# Patient Record
Sex: Female | Born: 1940
Health system: Southern US, Community
[De-identification: ages and names within clinical notes are randomized; demographics above are authoritative.]

## PROBLEM LIST (undated history)

## (undated) ENCOUNTER — Emergency Department (HOSPITAL_COMMUNITY): Payer: PPO

## (undated) DIAGNOSIS — K5790 Diverticulosis of intestine, part unspecified, without perforation or abscess without bleeding: Secondary | ICD-10-CM

## (undated) DIAGNOSIS — M1990 Unspecified osteoarthritis, unspecified site: Secondary | ICD-10-CM

## (undated) DIAGNOSIS — R7303 Prediabetes: Secondary | ICD-10-CM

## (undated) DIAGNOSIS — K449 Diaphragmatic hernia without obstruction or gangrene: Secondary | ICD-10-CM

## (undated) DIAGNOSIS — G4733 Obstructive sleep apnea (adult) (pediatric): Secondary | ICD-10-CM

## (undated) DIAGNOSIS — N6019 Diffuse cystic mastopathy of unspecified breast: Secondary | ICD-10-CM

## (undated) DIAGNOSIS — E039 Hypothyroidism, unspecified: Secondary | ICD-10-CM

## (undated) DIAGNOSIS — Z8249 Family history of ischemic heart disease and other diseases of the circulatory system: Secondary | ICD-10-CM

## (undated) DIAGNOSIS — I10 Essential (primary) hypertension: Secondary | ICD-10-CM

## (undated) DIAGNOSIS — C8310 Mantle cell lymphoma, unspecified site: Secondary | ICD-10-CM

## (undated) DIAGNOSIS — E785 Hyperlipidemia, unspecified: Secondary | ICD-10-CM

## (undated) DIAGNOSIS — K648 Other hemorrhoids: Secondary | ICD-10-CM

## (undated) DIAGNOSIS — C439 Malignant melanoma of skin, unspecified: Secondary | ICD-10-CM

## (undated) DIAGNOSIS — E559 Vitamin D deficiency, unspecified: Secondary | ICD-10-CM

## (undated) DIAGNOSIS — D126 Benign neoplasm of colon, unspecified: Secondary | ICD-10-CM

## (undated) DIAGNOSIS — H269 Unspecified cataract: Secondary | ICD-10-CM

## (undated) DIAGNOSIS — K589 Irritable bowel syndrome without diarrhea: Secondary | ICD-10-CM

## (undated) DIAGNOSIS — K219 Gastro-esophageal reflux disease without esophagitis: Secondary | ICD-10-CM

## (undated) HISTORY — DX: Irritable bowel syndrome, unspecified: K58.9

## (undated) HISTORY — DX: Diverticulosis of intestine, part unspecified, without perforation or abscess without bleeding: K57.90

## (undated) HISTORY — DX: Malignant melanoma of skin, unspecified: C43.9

## (undated) HISTORY — PX: APPENDECTOMY: SHX54

## (undated) HISTORY — DX: Unspecified cataract: H26.9

## (undated) HISTORY — PX: BUNIONECTOMY: SHX129

## (undated) HISTORY — DX: Unspecified osteoarthritis, unspecified site: M19.90

## (undated) HISTORY — DX: Prediabetes: R73.03

## (undated) HISTORY — DX: Gastro-esophageal reflux disease without esophagitis: K21.9

## (undated) HISTORY — DX: Vitamin D deficiency, unspecified: E55.9

## (undated) HISTORY — DX: Other hemorrhoids: K64.8

## (undated) HISTORY — PX: KNEE ARTHROSCOPY: SUR90

## (undated) HISTORY — DX: Essential (primary) hypertension: I10

## (undated) HISTORY — PX: TONSILLECTOMY: SUR1361

## (undated) HISTORY — DX: Hypothyroidism, unspecified: E03.9

## (undated) HISTORY — DX: Diaphragmatic hernia without obstruction or gangrene: K44.9

## (undated) HISTORY — DX: Obstructive sleep apnea (adult) (pediatric): G47.33

## (undated) HISTORY — DX: Hyperlipidemia, unspecified: E78.5

## (undated) HISTORY — PX: UMBILICAL HERNIA REPAIR: SHX196

## (undated) HISTORY — DX: Diffuse cystic mastopathy of unspecified breast: N60.19

## (undated) HISTORY — PX: CATARACT EXTRACTION, BILATERAL: SHX1313

## (undated) HISTORY — DX: Benign neoplasm of colon, unspecified: D12.6

## (undated) HISTORY — DX: Mantle cell lymphoma, unspecified site: C83.10

---

## 1992-09-13 DIAGNOSIS — D126 Benign neoplasm of colon, unspecified: Secondary | ICD-10-CM

## 1992-09-13 HISTORY — DX: Benign neoplasm of colon, unspecified: D12.6

## 1998-08-21 ENCOUNTER — Other Ambulatory Visit: Admission: RE | Admit: 1998-08-21 | Discharge: 1998-08-21 | Payer: Self-pay | Admitting: Podiatry

## 1998-09-23 ENCOUNTER — Encounter: Payer: Self-pay | Admitting: Obstetrics and Gynecology

## 1998-09-23 ENCOUNTER — Ambulatory Visit (HOSPITAL_COMMUNITY): Admission: RE | Admit: 1998-09-23 | Discharge: 1998-09-23 | Payer: Self-pay | Admitting: Obstetrics and Gynecology

## 1999-04-29 ENCOUNTER — Encounter: Payer: Self-pay | Admitting: Internal Medicine

## 1999-04-29 ENCOUNTER — Ambulatory Visit (HOSPITAL_COMMUNITY): Admission: RE | Admit: 1999-04-29 | Discharge: 1999-04-29 | Payer: Self-pay | Admitting: Internal Medicine

## 1999-06-09 ENCOUNTER — Other Ambulatory Visit: Admission: RE | Admit: 1999-06-09 | Discharge: 1999-06-09 | Payer: Self-pay | Admitting: Obstetrics and Gynecology

## 1999-12-01 ENCOUNTER — Encounter: Payer: Self-pay | Admitting: Obstetrics and Gynecology

## 1999-12-01 ENCOUNTER — Ambulatory Visit (HOSPITAL_COMMUNITY): Admission: RE | Admit: 1999-12-01 | Discharge: 1999-12-01 | Payer: Self-pay | Admitting: Obstetrics and Gynecology

## 2000-06-16 ENCOUNTER — Other Ambulatory Visit: Admission: RE | Admit: 2000-06-16 | Discharge: 2000-06-16 | Payer: Self-pay | Admitting: Obstetrics and Gynecology

## 2001-01-03 ENCOUNTER — Ambulatory Visit (HOSPITAL_COMMUNITY): Admission: RE | Admit: 2001-01-03 | Discharge: 2001-01-03 | Payer: Self-pay | Admitting: Obstetrics and Gynecology

## 2001-01-03 ENCOUNTER — Encounter: Payer: Self-pay | Admitting: Obstetrics and Gynecology

## 2002-02-20 ENCOUNTER — Ambulatory Visit (HOSPITAL_COMMUNITY): Admission: RE | Admit: 2002-02-20 | Discharge: 2002-02-20 | Payer: Self-pay | Admitting: Internal Medicine

## 2002-02-20 ENCOUNTER — Encounter: Payer: Self-pay | Admitting: Internal Medicine

## 2002-10-16 ENCOUNTER — Other Ambulatory Visit: Admission: RE | Admit: 2002-10-16 | Discharge: 2002-10-16 | Payer: Self-pay | Admitting: Obstetrics and Gynecology

## 2003-03-05 ENCOUNTER — Ambulatory Visit (HOSPITAL_COMMUNITY): Admission: RE | Admit: 2003-03-05 | Discharge: 2003-03-05 | Payer: Self-pay | Admitting: Internal Medicine

## 2003-03-05 ENCOUNTER — Encounter: Payer: Self-pay | Admitting: Internal Medicine

## 2003-03-19 ENCOUNTER — Ambulatory Visit (HOSPITAL_COMMUNITY): Admission: RE | Admit: 2003-03-19 | Discharge: 2003-03-19 | Payer: Self-pay | Admitting: Internal Medicine

## 2003-12-03 ENCOUNTER — Ambulatory Visit: Admission: RE | Admit: 2003-12-03 | Discharge: 2003-12-03 | Payer: Self-pay | Admitting: Internal Medicine

## 2003-12-06 ENCOUNTER — Emergency Department (HOSPITAL_COMMUNITY): Admission: EM | Admit: 2003-12-06 | Discharge: 2003-12-07 | Payer: Self-pay | Admitting: Emergency Medicine

## 2003-12-09 ENCOUNTER — Other Ambulatory Visit: Admission: RE | Admit: 2003-12-09 | Discharge: 2003-12-09 | Payer: Self-pay | Admitting: Obstetrics and Gynecology

## 2004-03-17 ENCOUNTER — Ambulatory Visit (HOSPITAL_COMMUNITY): Admission: RE | Admit: 2004-03-17 | Discharge: 2004-03-17 | Payer: Self-pay | Admitting: Internal Medicine

## 2004-03-24 ENCOUNTER — Ambulatory Visit (HOSPITAL_COMMUNITY): Admission: RE | Admit: 2004-03-24 | Discharge: 2004-03-24 | Payer: Self-pay | Admitting: Internal Medicine

## 2004-12-31 ENCOUNTER — Other Ambulatory Visit: Admission: RE | Admit: 2004-12-31 | Discharge: 2004-12-31 | Payer: Self-pay | Admitting: Obstetrics and Gynecology

## 2005-04-08 ENCOUNTER — Ambulatory Visit (HOSPITAL_COMMUNITY): Admission: RE | Admit: 2005-04-08 | Discharge: 2005-04-08 | Payer: Self-pay | Admitting: Pulmonary Disease

## 2005-04-15 ENCOUNTER — Encounter: Admission: RE | Admit: 2005-04-15 | Discharge: 2005-04-15 | Payer: Self-pay | Admitting: Internal Medicine

## 2005-08-10 ENCOUNTER — Ambulatory Visit (HOSPITAL_COMMUNITY): Admission: RE | Admit: 2005-08-10 | Discharge: 2005-08-10 | Payer: Self-pay | Admitting: Obstetrics and Gynecology

## 2005-08-11 ENCOUNTER — Ambulatory Visit (HOSPITAL_COMMUNITY): Admission: RE | Admit: 2005-08-11 | Discharge: 2005-08-11 | Payer: Self-pay | Admitting: Internal Medicine

## 2005-08-23 ENCOUNTER — Ambulatory Visit: Payer: Self-pay | Admitting: Gastroenterology

## 2006-01-04 ENCOUNTER — Other Ambulatory Visit: Admission: RE | Admit: 2006-01-04 | Discharge: 2006-01-04 | Payer: Self-pay | Admitting: Obstetrics and Gynecology

## 2006-04-22 ENCOUNTER — Ambulatory Visit (HOSPITAL_COMMUNITY): Admission: RE | Admit: 2006-04-22 | Discharge: 2006-04-22 | Payer: Self-pay | Admitting: Obstetrics and Gynecology

## 2006-08-17 ENCOUNTER — Ambulatory Visit: Payer: Self-pay | Admitting: Gastroenterology

## 2006-08-31 ENCOUNTER — Ambulatory Visit: Payer: Self-pay | Admitting: Gastroenterology

## 2007-01-17 ENCOUNTER — Other Ambulatory Visit: Admission: RE | Admit: 2007-01-17 | Discharge: 2007-01-17 | Payer: Self-pay | Admitting: Obstetrics and Gynecology

## 2007-01-24 ENCOUNTER — Ambulatory Visit: Payer: Self-pay | Admitting: Cardiology

## 2007-02-14 ENCOUNTER — Ambulatory Visit: Payer: Self-pay

## 2007-02-14 ENCOUNTER — Encounter: Payer: Self-pay | Admitting: Cardiology

## 2007-05-23 ENCOUNTER — Ambulatory Visit (HOSPITAL_COMMUNITY): Admission: RE | Admit: 2007-05-23 | Discharge: 2007-05-23 | Payer: Self-pay | Admitting: Internal Medicine

## 2007-10-13 ENCOUNTER — Encounter: Admission: RE | Admit: 2007-10-13 | Discharge: 2007-11-04 | Payer: Self-pay | Admitting: Internal Medicine

## 2007-10-30 ENCOUNTER — Ambulatory Visit: Payer: Self-pay | Admitting: Cardiology

## 2008-02-09 ENCOUNTER — Other Ambulatory Visit: Admission: RE | Admit: 2008-02-09 | Discharge: 2008-02-09 | Payer: Self-pay | Admitting: Obstetrics and Gynecology

## 2008-05-30 ENCOUNTER — Ambulatory Visit (HOSPITAL_COMMUNITY): Admission: RE | Admit: 2008-05-30 | Discharge: 2008-05-30 | Payer: Self-pay | Admitting: Obstetrics and Gynecology

## 2009-02-12 ENCOUNTER — Other Ambulatory Visit: Admission: RE | Admit: 2009-02-12 | Discharge: 2009-02-12 | Payer: Self-pay | Admitting: Obstetrics and Gynecology

## 2009-02-19 ENCOUNTER — Ambulatory Visit (HOSPITAL_COMMUNITY): Admission: RE | Admit: 2009-02-19 | Discharge: 2009-02-19 | Payer: Self-pay | Admitting: Internal Medicine

## 2009-06-23 ENCOUNTER — Ambulatory Visit (HOSPITAL_COMMUNITY): Admission: RE | Admit: 2009-06-23 | Discharge: 2009-06-23 | Payer: Self-pay | Admitting: Internal Medicine

## 2009-06-27 ENCOUNTER — Encounter: Admission: RE | Admit: 2009-06-27 | Discharge: 2009-06-27 | Payer: Self-pay | Admitting: Internal Medicine

## 2009-09-01 ENCOUNTER — Ambulatory Visit (HOSPITAL_COMMUNITY): Admission: RE | Admit: 2009-09-01 | Discharge: 2009-09-01 | Payer: Self-pay | Admitting: Internal Medicine

## 2009-11-25 LAB — PULMONARY FUNCTION TEST

## 2009-12-02 ENCOUNTER — Encounter: Admission: RE | Admit: 2009-12-02 | Discharge: 2009-12-02 | Payer: Self-pay | Admitting: Internal Medicine

## 2009-12-03 ENCOUNTER — Ambulatory Visit (HOSPITAL_COMMUNITY): Admission: RE | Admit: 2009-12-03 | Discharge: 2009-12-03 | Payer: Self-pay | Admitting: Internal Medicine

## 2010-07-03 ENCOUNTER — Ambulatory Visit (HOSPITAL_COMMUNITY): Admission: RE | Admit: 2010-07-03 | Discharge: 2010-07-03 | Payer: Self-pay | Admitting: Internal Medicine

## 2010-10-04 ENCOUNTER — Encounter: Payer: Self-pay | Admitting: Internal Medicine

## 2010-12-24 ENCOUNTER — Ambulatory Visit (HOSPITAL_BASED_OUTPATIENT_CLINIC_OR_DEPARTMENT_OTHER): Payer: Medicare Other | Attending: Internal Medicine

## 2010-12-24 DIAGNOSIS — G4730 Sleep apnea, unspecified: Secondary | ICD-10-CM | POA: Insufficient documentation

## 2010-12-24 DIAGNOSIS — G4710 Hypersomnia, unspecified: Secondary | ICD-10-CM | POA: Insufficient documentation

## 2011-01-02 DIAGNOSIS — G4710 Hypersomnia, unspecified: Secondary | ICD-10-CM

## 2011-01-02 DIAGNOSIS — G4730 Sleep apnea, unspecified: Secondary | ICD-10-CM

## 2011-01-09 NOTE — Procedures (Signed)
NAMETINESHA, Shannon Parsons              ACCOUNT NO.:  [id number]  MEDICAL RECORD NO.:  [id number]          PATIENT TYPE:  OUT  LOCATION:  SLEEP CENTER                 FACILITY:  [facility]  PHYSICIAN:  Maciah Schweigert D. Maple Hudson, MD, FCCP, FACPDATE OF BIRTH:  18-Jan[birth year]1943  DATE OF STUDY:  12/24/2010                           NOCTURNAL POLYSOMNOGRAM  REFERRING PHYSICIAN:  AMY STEVENSON  INDICATION FOR STUDY:  Hypersomnia with sleep apnea.  EPWORTH SLEEPINESS SCORE:  2/24, BMI 28.9.  Weight 148 pounds, height 60 inches.  Neck 14 inches.  MEDICATIONS:  Home medications are charted and reviewed.  SLEEP ARCHITECTURE:  Total sleep time 216.5 minutes with sleep efficiency 68.9%.  Stage I was 15.5%, stage II 62.8%, stage III absent, REM 21.7% of total sleep time.  Sleep latency 72 minutes, REM latency 119 minutes, awake after sleep onset 47.5 minutes, arousal index 36.6.  BEDTIME MEDICATION:  None.  RESPIRATORY DATA:  Apnea-hypopnea index (AHI) 16.4 per hour.  A total of 71 events was scored including 39 obstructive apneas and 32 hypopneas. The events were associated with supine sleep position.  REM/AHI 8.5 per hour, RDI 32.5 per hour.  The patient did not have enough early sleep time within the first hours of the study to permit application of CPAP titration by split protocol on the study night.  Respiratory events were rather strongly clustered in the hour between midnight and 1 a.m.  OXYGEN DATA:  Moderately loud snoring with oxygen desaturation to a nadir of 84% and a mean oxygen saturation through the study of 92.1% on room air.  CARDIAC DATA:  Sinus rhythm with frequent PVCs including some trigeminy.  MOVEMENT-PARASOMNIA:  No significant movement disturbance.  No bathroom trips.  IMPRESSIONS-RECOMMENDATIONS: 1. Moderate obstructive sleep apnea/hypopnea syndrome, AHI 16.4 per     hour.  Clustered events.  Moderately loud snoring with oxygen     desaturation to a nadir of 84% and a mean oxygen  saturation through     the study of 92.1% on room air. 2. She did not meet the criteria required for initiation of the split     protocol CPAP titration on this study night because of timing     issues. Note, the clustering pattern on the sleep hypnogram graphic presentation.  If CPAP titration is considered a practical treatment direction then consider return for a dedicated CPAP titration study.  Otherwise, evaluate for alternative management as clinically appropriate.     Cheridan Kibler D. Maple Hudson, MD, [facility], FACP Diplomate, Biomedical engineer of Sleep Medicine Electronically Signed    CDY/MEDQ  D:  01/09/2011 08:32:43  T:  01/09/2011 16:10:96  Job:  [id number]

## 2011-01-26 NOTE — Assessment & Plan Note (Signed)
[facility]                            CARDIOLOGY OFFICE NOTE   NAME:Shannon Parsons, Shannon Parsons                     MRN:          [id number]  DATE:01/24/2007                            DOB:          [birth year]    REASON FOR CONSULTATION:  Chest pain.   HISTORY OF PRESENT ILLNESS:  Shannon Parsons is a 70 year old woman with a  history of possible irritable bowel syndrome, hyperlipidemia,  gastroesophageal reflux disease, and family history of cardiovascular  disease.  She has no personal history of coronary artery disease or  myocardial infarction, and reports a stress test back in July 2004 that  was interpreted as normal with no evidence of ischemia at adequate  workload.  Symptomatically, she has experienced a general feeling of  fatigue with activity, as well as sporadic episodes of chest discomfort  described as a heaviness, although not exertional in character.  This  lasts only for minutes at a time.  She did have one more significant  episode of sharp chest pain that woke her from sleep at night, and  lasted for a few minutes.  She has had some trouble sleeping, but denies  any problems with stress or other active medical problems.  Her symptoms  have been present for the last 6 weeks, approximately.  She has had no  interval ischemic testing.  She saw Dr. Elisabeth Most recently, and had an  electrocardiogram, which shows decreased R wave progression, but no  frank anterolateral Q waves, and otherwise nonspecific changes.  Cardiac  markers were also obtained with a normal total CK of 156, and a troponin  I of 0.05.  She is referred today to discuss additional testing.   ALLERGIES:  TETRACYCLINE.   PRESENT MEDICATIONS:  1. Lipitor 10 mg p.o. daily.  2. Multivitamin daily.  3. Vitamin D supplements.   PAST MEDICAL HISTORY:  As outlined above.   SOCIAL HISTORY:  Patient is married.  She has no children.  She denies  any tobacco or alcohol use.  She works  at a [employer]  work.   FAMILY HISTORY:  Significant for cardiovascular disease including  myocardial infarction in the patient's father in his 80s, as well as the  patient's mother in her 35s.  Also, has a brother who had heart disease  at a young age.   REVIEW OF SYSTEMS:  As described in the HPI.  She reports problems with  asthma, and also stomach problems.   EXAMINATION:  Blood pressure is 114/78.  Heart rate is 71.  Weight is  150 pounds.  Patient is comfortable, in no acute distress, appearing younger than  stated age.  HEENT:  Conjunctivae were normal.  Oropharynx was clear.  NECK:  Supple.  No elevated jugular venous pressure without bruits.  No  thyromegaly is noted.  LUNGS:  Clear without labored breathing at rest.  CARDIAC EXAM:  Reveals a regular rate and rhythm.  No loud murmur or S3  gallop.  No pericardial rub.  ABDOMEN:  Soft and non-tender with normoactive bowel sounds.  EXTREMITIES:  Show no pitting edema.  Distal  pulses are 2+.  SKIN:  Warm and dry.  MUSCULOSKELETAL:  No kyphosis is noted.  NEUROPSYCHIATRIC:  Patient is alert x3.  Affect is normal.   IMPRESSION AND RECOMMENDATIONS:  1. Atypical chest pain as outlined above.  Also, general feeling of      fatigue and dyspnea on exertion, although she does remain active      walking 5 days a week.  Electrocardiogram is nonspecific at rest.      Last ischemic evaluation was via an exercise treadmill test in      2004, which was normal.  I discussed options for further testing,      including both noninvasive and invasive techniques, and at this      point she is most comfortable with noninvasive evaluation.  We will      proceed with an exercise echocardiogram, and can proceed from      there.  2. Further plans to follow.     Jonelle Sidle, MD  Electronically Signed    SGM/MedQ  DD: 01/24/2007  DT: 01/24/2007  Job #: [id number]   cc:   Lovenia Kim, D.O.

## 2011-01-26 NOTE — Assessment & Plan Note (Signed)
HEALTHCARE                            CARDIOLOGY OFFICE NOTE   NAME:Torbert, ARBOR LEER                     MRN:          [id number]  DATE:10/30/2007                            DOB:          [birth year]    PRIMARY CARE PHYSICIAN:  Lovenia Kim, D.O.   REASON FOR VISIT:  Possible orthostatic hypotension.   HISTORY OF PRESENT ILLNESS:  I actually saw Ms. Mcmasters back in May  2008 in referral with atypical chest pain.  At that time, she underwent  an exercise echocardiogram which was reassuring demonstrating no  evidence of dysrhythmia or ischemia.  A baseline study showed normal  left ventricular systolic function with no major valvular abnormalities  and lipomatous hypertrophy of the inner atrial septum which is a normal  variant.  She is referred back to the office today given concerns about  three episodes of what sounds to be orthostatic hypotension.  The  patient states that typically in the morning after she has gotten out of  bed, usually within 5-10 minutes, and always when standing, she has had  three episodes where she felt dizzy and lightheaded as if she might pass  out if she did not sit down, although without any frank sense of  palpitations or chest pain.  Two episodes occurred in the shower and one  occurred when she was standing in the kitchen making coffee.  She sat  down in a chair and states that her husband took her blood pressure with  an automated cuff, getting readings of 70/40 and 85/49.  After  several minutes, her blood pressure was 107/70 which is more typical  for her baseline blood pressure.  She has no longstanding hypertension  history, and in fact, has a relatively low normal blood pressure.  She  drinks perhaps three bottles of water a day.  She has not been urinating  any more frequently than normal and has had no obvious fevers, cough or  hemoptysis.  She has had some left leg pain and states she had lower  extremity Dopplers done over the last 3 weeks which demonstrated no deep  venous thrombosis.  I do not have this report for review.  She is on no  diuretic medications, otherwise, no antihypertensives.   We did orthostatic checks in the office and these were overall normal.  Her heart rate went from 56-64 standing and 67 after standing for 5  minutes.  Systolic blood pressure went from 124-128 standing and to 122  after standing for 5 minutes.  The patient had no symptoms at this time.   ALLERGIES:  TETRACYCLINE.   MEDICATIONS:  1. Vitamin D.  2. Multivitamin.  3. Gas-X p.r.n.  4. Lipitor 10 mg p.o. daily.   REVIEW OF SYSTEMS:  As described in the history of present illness,  otherwise, negative.   PHYSICAL EXAMINATION:  VITAL SIGNS:  Blood pressure is 130/80, heart  rate is 64, weight 156 pounds.  GENERAL:  The patient is comfortable, in no acute distress.  HEENT:  Conjunctivae was normal.  Pharynx clear.  NECK:  Supple.  No  elevated pressure, no bruits or thyromegaly is noted.  LUNGS:  Clear with unlabored breathing.  CARDIAC:  Regular rate and rhythm.  No S3, gallop or pericardial rub.  ABDOMEN:  Soft, nontender.  Normoactive bowel sounds.  EXTREMITIES:  No pitting edema.  Distal pulses are full.  Dorsalis pedis  are 2+ bilaterally.  No cords on palpation of the calves.  SKIN:  Warm and dry.  MUSCULOSKELETAL:  No kyphosis is noted.  NEUROPSYCHIATRIC:  The patient alert and oriented x3.  Affect seems  appropriate.   IMPRESSION AND RECOMMENDATIONS:  Apparent intermittent orthostatic  hypotension without frank syncope or palpitations.  The patient is not  frankly orthostatic today but does have a low normal blood pressure at  baseline on review of the chart.  I talked about some basic modification  in her diet, liberalizing salt intake and drinking more fluids.  This  may correct the problem.  If she continues to manifest these spells, I  have asked her to document clearly  her blood pressure and heart rate,  and if she is in fact continuing to have trouble, a volume expander such  as Florinef 0.1 mg to 0.2 mg daily would be a reasonable choice.  I  doubt that this is related to a clear cardiac problem.  She has never  had a documented dysrhythmia, and her ejection fraction has been normal  on previous assessment.  I have asked her to keep an eye on her symptoms  and follow-up with Dr. Elisabeth Most.  Cardiology follow-up can be p.r.n.Jonelle Sidle, MD  Electronically Signed    SGM/MedQ  DD: 10/30/2007  DT: 10/31/2007  Job #: [id number]

## 2011-02-15 ENCOUNTER — Other Ambulatory Visit (HOSPITAL_COMMUNITY): Payer: Self-pay | Admitting: Internal Medicine

## 2011-02-15 ENCOUNTER — Ambulatory Visit (HOSPITAL_COMMUNITY)
Admission: RE | Admit: 2011-02-15 | Discharge: 2011-02-15 | Disposition: A | Discharge status: Home / Self Care | Payer: Medicare Other | Source: Ambulatory Visit | Attending: Internal Medicine | Admitting: Internal Medicine

## 2011-02-15 DIAGNOSIS — R05 Cough: Secondary | ICD-10-CM

## 2011-02-15 DIAGNOSIS — R0602 Shortness of breath: Secondary | ICD-10-CM | POA: Insufficient documentation

## 2011-02-15 DIAGNOSIS — R059 Cough, unspecified: Secondary | ICD-10-CM | POA: Insufficient documentation

## 2011-02-17 ENCOUNTER — Ambulatory Visit (HOSPITAL_BASED_OUTPATIENT_CLINIC_OR_DEPARTMENT_OTHER): Payer: Medicare Other | Attending: Internal Medicine

## 2011-02-17 DIAGNOSIS — I4949 Other premature depolarization: Secondary | ICD-10-CM | POA: Insufficient documentation

## 2011-02-17 DIAGNOSIS — G4710 Hypersomnia, unspecified: Secondary | ICD-10-CM | POA: Insufficient documentation

## 2011-02-17 DIAGNOSIS — G4730 Sleep apnea, unspecified: Secondary | ICD-10-CM | POA: Insufficient documentation

## 2011-02-20 DIAGNOSIS — I4949 Other premature depolarization: Secondary | ICD-10-CM

## 2011-02-20 DIAGNOSIS — G4710 Hypersomnia, unspecified: Secondary | ICD-10-CM

## 2011-02-20 DIAGNOSIS — G4730 Sleep apnea, unspecified: Secondary | ICD-10-CM

## 2011-02-20 NOTE — Procedures (Signed)
Shannon Parsons, Shannon Parsons              ACCOUNT NO.:  [id number]  MEDICAL RECORD NO.:  [id number]          PATIENT TYPE:  OUT  LOCATION:  SLEEP CENTER                 FACILITY:  [facility]  PHYSICIAN:  Clinton D. Maple Hudson, MD, FCCP, FACPDATE OF BIRTH:  1941/07/30  DATE OF STUDY:  02/17/2011                           NOCTURNAL POLYSOMNOGRAM  REFERRING PHYSICIAN:  AMY STEVENSON  INDICATION FOR STUDY:  Hypersomnia with sleep apnea.  EPWORTH SLEEPINESS SCORE:  2/24, BMI 28.9, weight 148 pounds, height 60 inches.  Neck 14 inches.  MEDICATIONS:  Home medications are charted and reviewed.  Baseline diagnostic NPSG on December 24, 2010, had recorded an AHI of 16.4 per hour.  CPAP titration is requested.  SLEEP ARCHITECTURE:  Total sleep time 259.5 minutes with sleep efficiency 69.7%.  Stage I was 12.9%, stage II 75.3%, stage III absent, REM 11.8% of total sleep time.  Sleep latency 25 minutes.  REM latency 76.5 minutes.  Awake after sleep onset 87.5 minutes.  Arousal index 21.3.  Bedtime medication:  None.  RESPIRATORY DATA:  CPAP titration protocol.  CPAP was titrated to 6 CWP, AHI 0 per hour.  She wore a standard ResMed Mirage FX mask with heated humidifier and C-Flex of 3.  OXYGEN DATA:  Snoring was prevented by CPAP and oxygen maintained at mean saturation 93.1% on room air.  CARDIAC DATA:  Sinus rhythm with frequent PVCs.  MOVEMENT-PARASOMNIA:  No significant movement disturbance.  No bathroom trips.  IMPRESSIONS-RECOMMENDATIONS: 1. Successful CPAP titration to 6 CWP, AHI 0 per hour.  She wore a     standard ResMed Mirage FX mask with heated humidifier and a C-Flex     setting of 3. 2. Baseline diagnostic NPSG on December 24, 2010, had recorded an AHI of     16.4 per hour.     Clinton D. Maple Hudson, MD, [facility], FACP Diplomate, Biomedical engineer of Sleep Medicine Electronically Signed   CDY/MEDQ  D:  02/20/2011 10:05:31  T:  02/20/2011 23:34:12  Job:  [id number]

## 2011-03-15 ENCOUNTER — Encounter: Payer: Self-pay | Admitting: Pulmonary Disease

## 2011-03-16 ENCOUNTER — Ambulatory Visit (INDEPENDENT_AMBULATORY_CARE_PROVIDER_SITE_OTHER): Payer: Medicare Other | Admitting: Pulmonary Disease

## 2011-03-16 ENCOUNTER — Encounter: Payer: Self-pay | Admitting: Pulmonary Disease

## 2011-03-16 DIAGNOSIS — J449 Chronic obstructive pulmonary disease, unspecified: Secondary | ICD-10-CM | POA: Insufficient documentation

## 2011-03-16 DIAGNOSIS — J45909 Unspecified asthma, uncomplicated: Secondary | ICD-10-CM

## 2011-03-16 NOTE — Patient Instructions (Signed)
Will start back on dulera 100/5  2 inhalations am and pm everyday no matter how you feel.  Rinse mouth well. Will schedule for breathing studies in 2-3 weeks, and see you back same day to discuss.

## 2011-03-16 NOTE — Assessment & Plan Note (Addendum)
The pt's history is most suggestive of chronic obstructive asthma.  She has really not wanted to be on maintenance meds in the past, but I have discussed with her the pathophysiology of asthma and the role of ongoing airway inflammation.  I have stressed to her the importance of being on chronic ICS, and that without this, she is likely to have progressive airway remodeling with advancing airflow obstruction.  Will restart dulera at this point, and schedule her back for PFT's to get an idea where we stand.  She is agreeable to staying on meds.

## 2011-03-16 NOTE — Progress Notes (Signed)
  Subjective:    Patient ID: Franki Monte, female    DOB: 06-25-1941, 70 y.o.   MRN: [id number]  HPI The pt is a 70y/o female who I have been asked to see for breathing issues.  The pt states that she has had breathing problems "all of my life", and was diagnosed with asthma as an infant.  She states she took "adrenaline shots" for her symptoms.  She did better as an adult until her 10's, and began to take allergy shots which "helped".  The last few yrs her symptoms have worsened, and she has been tried on various medications.  She started on singulair about 3 mos ago, but stopped taking.  She was on dulera in May, and went back to duonebs.  She tells me she has had 3-4 flares of her breathing in the last year, with her last one being 2mos ago.  She was treated with abx and prednisone, and went back to her usual baseline.  Currently, she notes dry cough, but has excellent exercise tolerance.  She walks 30-52min everyday.  She has had a recent cxr which showed hyperinflation and prominent bronchovascular markings.  She has never had full pfts.    Review of Systems  Constitutional: Negative for fever and unexpected weight change.  HENT: Negative for ear pain, nosebleeds, congestion, sore throat, rhinorrhea, sneezing, trouble swallowing, dental problem, postnasal drip and sinus pressure.   Eyes: Negative for redness and itching.  Respiratory: Positive for cough and shortness of breath. Negative for chest tightness and wheezing.   Cardiovascular: Positive for chest pain. Negative for palpitations and leg swelling.  Gastrointestinal: Negative for nausea and vomiting.  Genitourinary: Negative for dysuria.  Musculoskeletal: Negative for joint swelling.  Skin: Positive for rash.  Neurological: Negative for headaches.  Hematological: Does not bruise/bleed easily.  Psychiatric/Behavioral: Negative for dysphoric mood. The patient is not nervous/anxious.        Objective:   Physical  Exam Constitutional:  Well developed, no acute distress  HENT:  Nares patent without discharge  Oropharynx without exudate, palate and uvula are normal  Eyes:  Perrla, eomi, no scleral icterus  Neck:  No JVD, no TMG  Cardiovascular:  Normal rate, regular rhythm, no rubs or gallops.  No murmurs        Intact distal pulses  Pulmonary :  decreased breath sounds, no stridor or respiratory distress   No rales, rhonchi, or wheezing  Abdominal:  Soft, nondistended, bowel sounds present.  No tenderness noted.   Musculoskeletal:  No lower extremity edema noted.  Lymph Nodes:  No cervical lymphadenopathy noted  Skin:  No cyanosis noted  Neurologic:  Alert, appropriate, moves all 4 extremities without obvious deficit.         Assessment & Plan:

## 2011-03-26 ENCOUNTER — Telehealth: Payer: Self-pay | Admitting: Pulmonary Disease

## 2011-03-26 NOTE — Telephone Encounter (Signed)
I spoke with the pt and she states when she saw [facility] she told him it had been 5 yrs since she had the last PFT but when she went through her records she actually had one last year. She wants to know does she still need another PFT. The pt has a copy of this PFT and states she will fax it for [facility] to review. I gave her triage fax number and advised to put it to attention Megan. I will forward message to Aundra Millet so she is aware. Carron Curie, CMA

## 2011-03-29 ENCOUNTER — Telehealth: Payer: Self-pay | Admitting: Pulmonary Disease

## 2011-03-29 NOTE — Telephone Encounter (Signed)
She needs to have pfts after being ON maintenance therapy for a few weeks.  Hence we started dulera, and she is to have pfts in 2-3 weeks.  This will establish her usual baseline ON good treatment.

## 2011-03-29 NOTE — Telephone Encounter (Signed)
This is a duplicate message. I advised the form was given to [facility] to review and we will call them with his recs. Carron Curie, CMA

## 2011-03-29 NOTE — Telephone Encounter (Signed)
Received results and put in KC's very important look at folder for him to review.  

## 2011-03-30 NOTE — Telephone Encounter (Signed)
Pt aware. Shannon Parsons, CMA  

## 2011-04-02 ENCOUNTER — Other Ambulatory Visit: Payer: Medicare Other

## 2011-04-02 ENCOUNTER — Ambulatory Visit (INDEPENDENT_AMBULATORY_CARE_PROVIDER_SITE_OTHER): Payer: Medicare Other | Admitting: Pulmonary Disease

## 2011-04-02 ENCOUNTER — Encounter: Payer: Self-pay | Admitting: Pulmonary Disease

## 2011-04-02 DIAGNOSIS — J45909 Unspecified asthma, uncomplicated: Secondary | ICD-10-CM

## 2011-04-02 LAB — PULMONARY FUNCTION TEST

## 2011-04-02 MED ORDER — BECLOMETHASONE DIPROPIONATE 80 MCG/ACT IN AERS: 2.0000 | INHALATION_SPRAY | Freq: Two times a day (BID) | RESPIRATORY_TRACT | Status: DC

## 2011-04-02 NOTE — Patient Instructions (Signed)
Stop  dulera Start qvar 2 inhalations am and pm.  Rinse mouth well.  Can still use rescue inhaler if needed.  Will check bloodwork looking for hereditary form of copd.  Will call you with results followup with me in 6mos.

## 2011-04-02 NOTE — Progress Notes (Signed)
  Subjective:    Patient ID: Shannon Parsons, female    DOB: 1940/12/03, 70 y.o.   MRN: [id number]  HPI The pt comes in today for f/u of her pfts.  She has a history of asthma, and was started on dulera last visit.  She has not seen a big difference in her breathing since being on the med, but has developed issues with hoarseness and dysphonia.  Her pfts today show moderate obstruction, no restriction, and a normal DLCO.  I have reviewed these in detail with her, and answered all of her questions.    Review of Systems  Constitutional: Negative for fever and unexpected weight change.  HENT: Positive for postnasal drip. Negative for ear pain, nosebleeds, congestion, sore throat, rhinorrhea, sneezing, trouble swallowing, dental problem and sinus pressure.   Eyes: Negative for redness and itching.  Respiratory: Negative for cough, chest tightness, shortness of breath and wheezing.   Cardiovascular: Negative for palpitations and leg swelling.  Gastrointestinal: Negative for nausea and vomiting.  Genitourinary: Negative for dysuria.  Musculoskeletal: Negative for joint swelling.  Skin: Negative for rash.  Neurological: Negative for headaches.  Hematological: Bruises/bleeds easily.  Psychiatric/Behavioral: Negative for dysphoric mood. The patient is not nervous/anxious.        Objective:   Physical Exam Wd female in nad Nares without discharge or purulence Chest clear, but mild decrease in bs Cor with rrr LE without edema, no cyanosis Alert, oriented, moves all 4        Assessment & Plan:

## 2011-04-02 NOTE — Progress Notes (Signed)
PFT was done today.  

## 2011-04-02 NOTE — Assessment & Plan Note (Signed)
The pt has moderate obstruction on pfts that I suspect is fixed, and due to longstanding asthma with chronic ongoing airway inflammation.  Will also check AAT level for completeness.  The pt did not see a difference in her breathing with dulera, but I have explained to her that we are trying to prevent progressive airflow obstruction over the yrs that could put her functional status in jeopardy.  I will change her inhaler to qvar, which I hope with its small particle size will decrease her risk of thrush/dysphonia.  She is satisfied with her exertional tolerance, so will stay away from LABA for now.

## 2011-04-16 ENCOUNTER — Telehealth: Payer: Self-pay | Admitting: Pulmonary Disease

## 2011-04-16 NOTE — Telephone Encounter (Signed)
Not back in computer yet.

## 2011-04-16 NOTE — Telephone Encounter (Signed)
Phone encounter signed by accident -- will hold message in triage until we reach pt.

## 2011-04-16 NOTE — Telephone Encounter (Signed)
[id number]

## 2011-04-16 NOTE — Telephone Encounter (Signed)
Called and spoke with pt and she is aware that [facility] wanted her to cont the qvar and follow back up in 6 months.  Pt stated that she will try to cont the med but it is expensive.  She also wanted to know the results of the alpha 1 test.  i explained that once we get the results back we will call and let her know.  KC please advise if you have seen the results.  thanks

## 2011-04-19 ENCOUNTER — Telehealth: Payer: Self-pay | Admitting: Pulmonary Disease

## 2011-04-19 NOTE — Telephone Encounter (Signed)
LMTCB

## 2011-04-19 NOTE — Telephone Encounter (Signed)
Called spoke with patient, informed her that her A1AT results are not back yet and can take a few weeks for the results to return.   Pt does request KC's recs on her qvar : pt states breathing is doing well on the 2 puffs bid but d/t the cost of this inhaler, if she can either reduce the puffs per day to 1 puff bid or change to another inhaler that may be less expensive.  KC please advise, thanks!

## 2011-04-19 NOTE — Telephone Encounter (Signed)
Let her know that qvar is probably the least expensive inhaled steroid on the market unless her insurance has a preferred drug.  Part of the problem is that qvar also has the smallest particle size which minimizes thrush and changes in voice. She can try one in am and pm, but if not controlled really needs to be on 2 am and pm.

## 2011-04-19 NOTE — Telephone Encounter (Signed)
Pt aware of kc's response and she really likes the qvar so she is going to try using it 1 puff twice a day if this does not work she will call back with a formulary that list covered inhaled steroids for dr clance to pick from but for now she is going to try the 1 puff twice a day

## 2011-05-27 ENCOUNTER — Telehealth: Payer: Self-pay | Admitting: Gastroenterology

## 2011-05-27 NOTE — Telephone Encounter (Signed)
All questions answered

## 2011-06-01 ENCOUNTER — Telehealth: Payer: Self-pay | Admitting: Pulmonary Disease

## 2011-06-01 NOTE — Telephone Encounter (Signed)
Shannon Parsons, can you please check on this with lab.  I don/t see a result back.

## 2011-06-01 NOTE — Telephone Encounter (Signed)
Pt states she had lab work for alpha 1 done on 04/02/11 and still has not heard back on these results. Please advise Dr. Shelle Iron if you have lab results. Thanks  Carver Fila, CMA

## 2011-06-02 NOTE — Telephone Encounter (Signed)
Received results from [facility] and put in [facility] very important look at folder.

## 2011-06-02 NOTE — Telephone Encounter (Signed)
I spoke with patient about results and he verbalized understanding and had no questions 

## 2011-06-02 NOTE — Telephone Encounter (Signed)
Let her know sorry for the delay.  The lab lost her results, and we just received.   Let her know is normal

## 2011-06-02 NOTE — Telephone Encounter (Signed)
Called [facility] at [phone] and spoke with Scott at ext [phone].  He is going to fax over results to the triage fax.

## 2011-06-08 ENCOUNTER — Encounter: Payer: Self-pay | Admitting: Pulmonary Disease

## 2011-06-23 ENCOUNTER — Encounter: Payer: Self-pay | Admitting: Gastroenterology

## 2011-06-28 ENCOUNTER — Other Ambulatory Visit (HOSPITAL_COMMUNITY): Payer: Self-pay | Admitting: Internal Medicine

## 2011-06-28 DIAGNOSIS — Z1231 Encounter for screening mammogram for malignant neoplasm of breast: Secondary | ICD-10-CM

## 2011-06-28 DIAGNOSIS — Z780 Asymptomatic menopausal state: Secondary | ICD-10-CM

## 2011-06-28 DIAGNOSIS — M8580 Other specified disorders of bone density and structure, unspecified site: Secondary | ICD-10-CM

## 2011-07-19 ENCOUNTER — Encounter: Payer: Self-pay | Admitting: Gastroenterology

## 2011-07-19 ENCOUNTER — Ambulatory Visit (INDEPENDENT_AMBULATORY_CARE_PROVIDER_SITE_OTHER): Payer: Medicare Other | Admitting: Gastroenterology

## 2011-07-19 DIAGNOSIS — R159 Full incontinence of feces: Secondary | ICD-10-CM

## 2011-07-19 DIAGNOSIS — K5900 Constipation, unspecified: Secondary | ICD-10-CM

## 2011-07-19 DIAGNOSIS — K5730 Diverticulosis of large intestine without perforation or abscess without bleeding: Secondary | ICD-10-CM

## 2011-07-19 DIAGNOSIS — Z860100 Personal history of colon polyps, unspecified: Secondary | ICD-10-CM | POA: Insufficient documentation

## 2011-07-19 MED ORDER — PEG-KCL-NACL-NASULF-NA ASC-C 100 G PO SOLR: 1.0000 | Freq: Once | ORAL | Status: DC

## 2011-07-19 NOTE — Patient Instructions (Addendum)
You have been scheduled for a Colonoscopy. See separate instructions.  Pick up your prep kit from your pharmacy.  Please start Miralax 17 grams in 8 oz of water 1-2 x daily for constipation. cc: Lucky Cowboy, MD

## 2011-07-19 NOTE — Progress Notes (Signed)
History of Present Illness: This is a 70 year old female with chronic complaints of bloating, lower abdominal discomfort and constipation. She states these symptoms have been worse over the past year. She states she has 2 bowel movements each day and spends up to 30 minutes on the commode prior to a bowel movement. She has episodes of small amounts of loose stool incontinence for the past year when she is on her feet for long periods of time. She was referred to [facility] by her primary physician and it sounds like she had biofeedback or another treatment for fecal incontinence which was not helpful. Her last colonoscopy was performed in December 2007 and showed only diverticulosis. Denies weight loss, abdominal pain, constipation, diarrhea, change in stool caliber, melena, hematochezia, nausea, vomiting, dysphagia, reflux symptoms, chest pain.  Review of Systems: Pertinent positive and negative review of systems were noted in the above HPI section. All other review of systems were otherwise negative.  Current Medications, Allergies, Past Medical History, Past Surgical History, Family History and Social History were reviewed in [employer] record.  Physical Exam: General: Well developed , well nourished, no acute distress Head: Normocephalic and atraumatic Eyes:  sclerae anicteric, EOMI Ears: Normal auditory acuity Mouth: No deformity or lesions Neck: Supple, no masses or thyromegaly Lungs: Clear throughout to auscultation Heart: Regular rate and rhythm; no murmurs, rubs or bruits Abdomen: Soft, non tender and non distended. No masses, hepatosplenomegaly or hernias noted. Normal Bowel sounds Rectal: Deferred to colonoscopy at patient request. States her gynecologist performed a rectal exam in September that was unremarkable Musculoskeletal: Symmetrical with no gross deformities  Skin: No lesions on visible extremities Pulses:  Normal pulses noted Extremities: No  clubbing, cyanosis, edema or deformities noted Neurological: Alert oriented x 4, resting tremor. Cervical Nodes:  No significant cervical adenopathy Inguinal Nodes: No significant inguinal adenopathy Psychological:  Alert and cooperative. Normal mood and affect  Assessment and Recommendations:  1. Fecal incontinence. Rule out overflow with constipation. Suspect anorectal muscle dysfunction or pelvic floor relaxation. Trial of MiraLax once or twice daily for possible constipation. Further evaluation colonoscopy and consider referral to [facility] for further management.  2. Constipation and probable irritable bowel syndrome. MiraLax once or twice daily as above and continue until 3 times a day when necessary. Consider a trial of probiotics and Gas-X if her abdominal discomfort and bloating do not improve with treatment of her constipation.  3. Personal history of adenomatous colon polyps. She is due for 5 years surveillance colonoscopy. The risks, benefits, and alternatives to colonoscopy with possible biopsy and possible polypectomy were discussed with the patient and they consent to proceed.

## 2011-07-20 ENCOUNTER — Encounter: Payer: Self-pay | Admitting: Gastroenterology

## 2011-07-22 ENCOUNTER — Ambulatory Visit (HOSPITAL_COMMUNITY)
Admission: RE | Admit: 2011-07-22 | Discharge: 2011-07-22 | Disposition: A | Discharge status: Home / Self Care | Payer: Medicare Other | Source: Ambulatory Visit | Attending: Internal Medicine | Admitting: Internal Medicine

## 2011-07-22 DIAGNOSIS — M8580 Other specified disorders of bone density and structure, unspecified site: Secondary | ICD-10-CM

## 2011-07-22 DIAGNOSIS — Z1231 Encounter for screening mammogram for malignant neoplasm of breast: Secondary | ICD-10-CM

## 2011-07-22 DIAGNOSIS — Z13820 Encounter for screening for osteoporosis: Secondary | ICD-10-CM | POA: Insufficient documentation

## 2011-07-22 DIAGNOSIS — Z780 Asymptomatic menopausal state: Secondary | ICD-10-CM

## 2011-07-22 LAB — HM DEXA SCAN

## 2011-08-19 ENCOUNTER — Ambulatory Visit (AMBULATORY_SURGERY_CENTER): Payer: Medicare Other | Admitting: Gastroenterology

## 2011-08-19 ENCOUNTER — Encounter: Payer: Self-pay | Admitting: Gastroenterology

## 2011-08-19 DIAGNOSIS — D126 Benign neoplasm of colon, unspecified: Secondary | ICD-10-CM

## 2011-08-19 DIAGNOSIS — K5730 Diverticulosis of large intestine without perforation or abscess without bleeding: Secondary | ICD-10-CM

## 2011-08-19 DIAGNOSIS — Z860100 Personal history of colon polyps, unspecified: Secondary | ICD-10-CM

## 2011-08-19 DIAGNOSIS — R159 Full incontinence of feces: Secondary | ICD-10-CM

## 2011-08-19 DIAGNOSIS — Z1211 Encounter for screening for malignant neoplasm of colon: Secondary | ICD-10-CM

## 2011-08-19 DIAGNOSIS — K5900 Constipation, unspecified: Secondary | ICD-10-CM

## 2011-08-19 LAB — HM COLONOSCOPY

## 2011-08-19 MED ORDER — SODIUM CHLORIDE 0.9 % IV SOLN: 500.0000 mL | INTRAVENOUS | Status: DC

## 2011-08-19 NOTE — Op Note (Signed)
[name] [address] [place], [place]  [address]  COLONOSCOPY PROCEDURE REPORT  PATIENT:  Shannon Parsons, Shannon Parsons  MR#:  [id number] BIRTHDATE:  1941-05-15, 70 yrs. old  GENDER:  female ENDOSCOPIST:  Judie Petit T. Russella Dar, MD, [facility]  PROCEDURE DATE:  08/19/2011 PROCEDURE:  Colonoscopy with snare polypectomy ASA CLASS:  Class III INDICATIONS:  surveillance and high-risk screening, fecal incontinence, history of pre-cancerous (adenomatous) colon polyps: 1994. MEDICATIONS:   MAC sedation, administered by CRNA, propofol (Diprivan) 230 mg IV DESCRIPTION OF PROCEDURE:   After the risks benefits and alternatives of the procedure were thoroughly explained, informed consent was obtained.  Digital rectal exam was performed and revealed no abnormalities and nromal sphincter tone.  The LB CF-H180AL [id number] endoscope was introduced through the anus and advanced to the cecum, which was identified by both the appendix and ileocecal valve, without limitations.  The quality of the prep was good, using MoviPrep.  The instrument was then slowly withdrawn as the colon was fully examined. <<PROCEDUREIMAGES>> FINDINGS:  A sessile polyp was found in the mid transverse colon. It was 5 mm in size. Polyp was snared without cautery. Retrieval was successful.  Moderate diverticulosis was found in the sigmoid colon. Otherwise normal colonoscopy without other polyps, masses, vascular ectasias, or inflammatory changes. Retroflexed views in the rectum revealed no abnormalities.  The time to cecum =  2.33 minutes. The scope was then withdrawn (time =  8  min) from the patient and the procedure completed.  COMPLICATIONS:  None  ENDOSCOPIC IMPRESSION: 1) 5 mm sessile polyp in the mid transverse colon 2) Moderate diverticulosis in the sigmoid colon  RECOMMENDATIONS: 1) Await pathology results 2) High fiber diet with liberal fluid intake. 3) Repeat Colonoscopy in 5 years.  Shannon Lick. Russella Dar, MD, Shannon Parsons  CC:   Shannon Cowboy, MD  n. Shannon Parsons at 08/19/2011 08:53 AM  Shannon Parsons, [id number]

## 2011-08-19 NOTE — Progress Notes (Signed)
Patient did not experience any of the following events: a burn prior to discharge; a fall within the facility; wrong site/side/patient/procedure/implant event; or a hospital transfer or hospital admission upon discharge from the facility. (G8907) Patient did not have preoperative order for IV antibiotic SSI prophylaxis. (G8918)  

## 2011-08-20 ENCOUNTER — Telehealth: Payer: Self-pay | Admitting: *Deleted

## 2011-08-20 NOTE — Telephone Encounter (Signed)

## 2011-08-31 ENCOUNTER — Encounter: Payer: Self-pay | Admitting: Gastroenterology

## 2011-08-31 ENCOUNTER — Telehealth: Payer: Self-pay | Admitting: Gastroenterology

## 2011-08-31 NOTE — Telephone Encounter (Signed)
Recall entered and I have notified the patient

## 2011-08-31 NOTE — Telephone Encounter (Signed)
Patient calling for results of the polyp path.  Dr Russella Dar I don't see a letter generated in the system.  Please advise recall date.

## 2011-08-31 NOTE — Telephone Encounter (Signed)
Recall 08/2016 I generated a letter

## 2011-09-14 DIAGNOSIS — H269 Unspecified cataract: Secondary | ICD-10-CM

## 2011-09-14 HISTORY — DX: Unspecified cataract: H26.9

## 2011-10-04 ENCOUNTER — Ambulatory Visit (INDEPENDENT_AMBULATORY_CARE_PROVIDER_SITE_OTHER): Payer: Medicare Other | Admitting: Pulmonary Disease

## 2011-10-04 ENCOUNTER — Encounter: Payer: Self-pay | Admitting: Pulmonary Disease

## 2011-10-04 DIAGNOSIS — J45909 Unspecified asthma, uncomplicated: Secondary | ICD-10-CM

## 2011-10-04 NOTE — Patient Instructions (Signed)
Stay on qvar 2 inhalations each am everyday.  If you get sick and asthma worsens, would increase dose to twice a day until better, then can go back to once a day followup with me in 6mos.

## 2011-10-04 NOTE — Assessment & Plan Note (Signed)
The patient has done very well from an asthma standpoint on a reduced Qvar dose.  She is not requiring albuterol rescue or neck treatment.  I have asked her to change her current dose to 2 puffs each a.m. To ensure that she gets an adequate dose, and she can increase this to b.i.d. Dosing with chest colds until she is improved.

## 2011-10-04 NOTE — Progress Notes (Signed)
  Subjective:    Patient ID: Shannon Parsons, female    DOB: Sep 30, 1940, 71 y.o.   MRN: [id number]  HPI The patient comes in today for followup of her known asthma.  She is staying on Qvar alone, and only using it once a day.  She has had no breathing issues for acute exacerbations since the last visit.  She feels that she is breathing well, and has not required any rescue medication.   Review of Systems  Constitutional: Negative for fever and unexpected weight change.  HENT: Positive for postnasal drip. Negative for ear pain, nosebleeds, congestion, sore throat, rhinorrhea, sneezing, trouble swallowing, dental problem and sinus pressure.   Eyes: Negative for redness and itching.  Respiratory: Negative for cough, chest tightness, shortness of breath and wheezing.   Cardiovascular: Negative for palpitations and leg swelling.  Gastrointestinal: Negative for nausea and vomiting.  Genitourinary: Negative for dysuria.  Musculoskeletal: Negative for joint swelling.  Skin: Negative for rash.  Neurological: Negative for headaches.  Hematological: Does not bruise/bleed easily.  Psychiatric/Behavioral: Negative for dysphoric mood. The patient is not nervous/anxious.        Objective:   Physical Exam Well-developed female in no acute distress Nose without purulence or discharge noted Chest totally clear to auscultation Cardiac regular rate and rhythm Lower extremities with no edema, no cyanosis noted Alert and oriented, moves all 4 extremities.       Assessment & Plan:

## 2012-07-21 ENCOUNTER — Telehealth: Payer: Self-pay | Admitting: Pulmonary Disease

## 2012-07-21 NOTE — Telephone Encounter (Signed)
I spoke with pt and she is scheduled to see [facility] 08/04/12 at 2:45. She stated she wants to wait until she comes in and see KC. I asked her if she is sure. She stated yes. Nothing further was needed

## 2012-08-04 ENCOUNTER — Ambulatory Visit (INDEPENDENT_AMBULATORY_CARE_PROVIDER_SITE_OTHER): Payer: Medicare Other | Admitting: Pulmonary Disease

## 2012-08-04 ENCOUNTER — Encounter: Payer: Self-pay | Admitting: Pulmonary Disease

## 2012-08-04 VITALS — BP 120/72 | HR 61 | Temp 98.1°F | Ht 64.0 in | Wt 142.0 lb

## 2012-08-04 DIAGNOSIS — J45901 Unspecified asthma with (acute) exacerbation: Secondary | ICD-10-CM

## 2012-08-04 MED ORDER — BECLOMETHASONE DIPROPIONATE 80 MCG/ACT IN AERS: 1.0000 | INHALATION_SPRAY | RESPIRATORY_TRACT | Status: DC | PRN

## 2012-08-04 MED ORDER — PREDNISONE 10 MG PO TABS: ORAL_TABLET | ORAL | Status: DC

## 2012-08-04 MED ORDER — METHYLPREDNISOLONE ACETATE 80 MG/ML IJ SUSP
80.0000 mg | Freq: Once | INTRAMUSCULAR | Status: AC
  Administered 2012-08-04: 80 mg via INTRAMUSCULAR

## 2012-08-04 MED ORDER — ALBUTEROL SULFATE HFA 108 (90 BASE) MCG/ACT IN AERS: 2.0000 | INHALATION_SPRAY | Freq: Four times a day (QID) | RESPIRATORY_TRACT | Status: DC | PRN

## 2012-08-04 NOTE — Assessment & Plan Note (Signed)
The patient is clearly having an acute asthma exacerbation, probably brought on by noncompliance with inhaled corticosteroids.  I have stressed to her again the importance of her maintenance inhaled steroid, and she will need a Depo-Medrol injection as well as a prednisone pulse to get her through this.  She is to call us if she begins to have purulent mucus.

## 2012-08-04 NOTE — Progress Notes (Signed)
  Subjective:    Patient ID: Shannon Parsons, female    DOB: Dec 20, 1940, 71 y.o.   MRN: [id number]  HPI The patient comes in today for an acute sick visit.  She has known asthma, and unfortunately has not been compliant with her inhaled corticosteroids.  She gives a 5 day history of increasing shortness of breath, chest tightness with classic air trapping symptoms, as well as increased cough.  She has not had any purulence noted.  I had asked her to increase her Qvar to 2 puffs each morning at the last visit, and she has not done this.  She has actually missed doses over the last few weeks.   Review of Systems  Constitutional: Negative for fever and unexpected weight change.  HENT: Positive for congestion and sinus pressure. Negative for ear pain, nosebleeds, sore throat, rhinorrhea, sneezing, trouble swallowing, dental problem and postnasal drip.   Eyes: Negative for redness and itching.  Respiratory: Positive for cough, chest tightness, shortness of breath and wheezing.   Cardiovascular: Negative for palpitations and leg swelling.  Gastrointestinal: Negative for nausea and vomiting.  Genitourinary: Negative for dysuria.  Musculoskeletal: Negative for joint swelling.  Skin: Negative for rash.  Neurological: Negative for headaches.  Hematological: Bruises/bleeds easily.  Psychiatric/Behavioral: Negative for dysphoric mood. The patient is not nervous/anxious.        Objective:   Physical Exam Well-developed female with mild increased work of breathing Nose without purulence or discharge noted Oropharynx clear Neck without JVD or lymphadenopathy Chest with scattered wheezes, and decreased airflow Cardiac exam with regular rate and rhythm Lower extremities with no edema, no cyanosis Alert and oriented, moves all extremities.       Assessment & Plan:

## 2012-08-04 NOTE — Patient Instructions (Addendum)
Will give you a steroid shot today to start working right away. Will give you a prednisone taper to help fight airway inflammation Stay on qvar 80  2 inhalations am and pm for the next 2-3 weeks, then can decrease to 2 puffs each am if doing well.  Please call us if you begin to cough up discolored mucus. followup with me in 2 mos, but call if not improving.

## 2012-08-07 ENCOUNTER — Other Ambulatory Visit (HOSPITAL_COMMUNITY): Payer: Self-pay | Admitting: Internal Medicine

## 2012-08-07 DIAGNOSIS — Z1231 Encounter for screening mammogram for malignant neoplasm of breast: Secondary | ICD-10-CM

## 2012-08-08 ENCOUNTER — Telehealth: Payer: Self-pay | Admitting: Pulmonary Disease

## 2012-08-08 NOTE — Telephone Encounter (Signed)
She should be seeing improvement in her symptoms within 48hrs on the prednisone.  If she does not, it could mean that she waited too long and her airway inflammation has become so severe that she may need intravenous steroids in the hospital.  It could also mean that her chest pressure and sob may be from a cardiac issue rather than her asthma.  If she truly has not improved at all since her last visit, then I would suggest that she go to the ER for evaluation.  I would not treat her with an abx unless she is bringing up discolored mucus.  She could also get an xray in the ER to see if she has developed pna.

## 2012-08-08 NOTE — Telephone Encounter (Signed)
Pt notified of KC recs. She says she does not think she needs to be hospitalized and that is is nor that bad. She did agree to go to the ED for evaluation if she does not improve.

## 2012-08-08 NOTE — Telephone Encounter (Signed)
Phone call from patient...seen 08/04/12 given Prednisone and Depo 80 inj.---wants to know how long it takes for her to start seeing difference in symptoms. Pt c/o chest tightness and still coughing up heavy mucus in morning(no color). Patient is using Dulera as rxd and seeing no real improvement.  Would like to know if she needs to give pred longer or if you can give her an abx to take.   Allergies  Allergen Reactions  . Tetracyclines & Related    Dr Shelle Iron please advise. Thanks.

## 2012-08-29 ENCOUNTER — Ambulatory Visit (HOSPITAL_COMMUNITY)
Admission: RE | Admit: 2012-08-29 | Discharge: 2012-08-29 | Disposition: A | Discharge status: Home / Self Care | Payer: Medicare Other | Source: Ambulatory Visit | Attending: Internal Medicine | Admitting: Internal Medicine

## 2012-08-29 DIAGNOSIS — Z1231 Encounter for screening mammogram for malignant neoplasm of breast: Secondary | ICD-10-CM | POA: Insufficient documentation

## 2012-10-06 ENCOUNTER — Ambulatory Visit (INDEPENDENT_AMBULATORY_CARE_PROVIDER_SITE_OTHER): Payer: Medicare Other | Admitting: Pulmonary Disease

## 2012-10-06 ENCOUNTER — Encounter: Payer: Self-pay | Admitting: Pulmonary Disease

## 2012-10-06 VITALS — BP 124/78 | HR 63 | Temp 98.7°F | Ht 63.75 in | Wt 146.0 lb

## 2012-10-06 DIAGNOSIS — J45909 Unspecified asthma, uncomplicated: Secondary | ICD-10-CM

## 2012-10-06 NOTE — Assessment & Plan Note (Signed)
The patient is doing much better from an asthma standpoint with regular Qvar use.  I have instructed her to use 2 puffs in the morning instead of one, and she can take an additional afternoon dose if her breathing is not to par on that given day.  She is to also call me if she has increased rescue inhaler use.

## 2012-10-06 NOTE — Patient Instructions (Addendum)
Stay on qvar 2 inhalations every am.  Would also take a dose in evening if you are having a bad day or if you think your asthma is acting up.  Keep mouth rinsed well followup with me in 6mos if doing well, but call if you are requiring increased albuterol use.

## 2012-10-06 NOTE — Progress Notes (Signed)
  Subjective:    Patient ID: Shannon Parsons, female    DOB: [birth year], 72 y.o.   MRN: [id number]  HPI The patient comes in today for followup of her known asthma.  She's been staying compliant with Qvar every morning, but sometimes only uses one half.  I have stressed to her the importance of using 2 puffs.  She has had no asthma issues since the last visit, and has not required her rescue inhaler.  She feels that she is breathing well currently.   Review of Systems  Constitutional: Negative for fever and unexpected weight change.  HENT: Negative for ear pain, nosebleeds, congestion, sore throat, rhinorrhea, sneezing, trouble swallowing, dental problem, postnasal drip and sinus pressure.   Eyes: Negative for redness and itching.  Respiratory: Positive for shortness of breath ( with heavy exertion). Negative for cough, chest tightness and wheezing.   Cardiovascular: Positive for chest pain ( some pain "comes and goes" chest, ribs and back x 2-3 weeks). Negative for palpitations and leg swelling.  Gastrointestinal: Negative for nausea and vomiting.  Genitourinary: Negative for dysuria.  Musculoskeletal: Negative for joint swelling.  Skin: Negative for rash.  Neurological: Negative for headaches.  Hematological: Does not bruise/bleed easily.  Psychiatric/Behavioral: Negative for dysphoric mood. The patient is not nervous/anxious.        Objective:   Physical Exam Well-developed female in no acute distress Nose without purulence or discharge noted Oropharynx clear Neck without lymphadenopathy or thyromegaly Chest with clear breath sounds, no wheezing Cardiac exam with regular rate and rhythm Lower extremities without edema, no cyanosis Alert and oriented, moves all 4 extremities.       Assessment & Plan:

## 2012-11-20 ENCOUNTER — Ambulatory Visit (HOSPITAL_COMMUNITY)
Admission: RE | Admit: 2012-11-20 | Discharge: 2012-11-20 | Disposition: A | Discharge status: Home / Self Care | Payer: Medicare Other | Source: Ambulatory Visit | Attending: Internal Medicine | Admitting: Internal Medicine

## 2012-11-20 ENCOUNTER — Other Ambulatory Visit (HOSPITAL_COMMUNITY): Payer: Self-pay | Admitting: Internal Medicine

## 2012-11-20 DIAGNOSIS — R0602 Shortness of breath: Secondary | ICD-10-CM

## 2012-11-20 DIAGNOSIS — I2510 Atherosclerotic heart disease of native coronary artery without angina pectoris: Secondary | ICD-10-CM | POA: Insufficient documentation

## 2013-01-30 ENCOUNTER — Other Ambulatory Visit: Payer: Self-pay | Admitting: Internal Medicine

## 2013-01-30 DIAGNOSIS — J449 Chronic obstructive pulmonary disease, unspecified: Secondary | ICD-10-CM

## 2013-01-30 DIAGNOSIS — R0602 Shortness of breath: Secondary | ICD-10-CM

## 2013-02-02 ENCOUNTER — Other Ambulatory Visit: Payer: Medicare Other

## 2013-02-06 ENCOUNTER — Ambulatory Visit
Admission: RE | Admit: 2013-02-06 | Discharge: 2013-02-06 | Disposition: A | Discharge status: Home / Self Care | Payer: Medicare Other | Source: Ambulatory Visit | Attending: Internal Medicine | Admitting: Internal Medicine

## 2013-02-06 DIAGNOSIS — J449 Chronic obstructive pulmonary disease, unspecified: Secondary | ICD-10-CM

## 2013-02-06 DIAGNOSIS — R0602 Shortness of breath: Secondary | ICD-10-CM

## 2013-03-13 ENCOUNTER — Ambulatory Visit (INDEPENDENT_AMBULATORY_CARE_PROVIDER_SITE_OTHER): Payer: Medicare Other | Admitting: Pulmonary Disease

## 2013-03-13 ENCOUNTER — Encounter: Payer: Self-pay | Admitting: Pulmonary Disease

## 2013-03-13 VITALS — BP 116/70 | HR 72 | Temp 98.6°F | Ht 64.0 in | Wt 146.0 lb

## 2013-03-13 DIAGNOSIS — J449 Chronic obstructive pulmonary disease, unspecified: Secondary | ICD-10-CM

## 2013-03-13 MED ORDER — BECLOMETHASONE DIPROPIONATE 80 MCG/ACT IN AERS: 2.0000 | INHALATION_SPRAY | Freq: Two times a day (BID) | RESPIRATORY_TRACT | Status: DC

## 2013-03-13 NOTE — Assessment & Plan Note (Signed)
The patient has known chronic obstructive asthma, but continues to stop taking her medication despite my numerous attempts in educating her about airway inflammation and remodeling.  I stressed to her again the importance of staying on inhaled corticosteroids on a daily basis, and given the significant nature of her asthma, she needs to take this twice a day as prescribed.  She felt that she was doing very well on Qvar when she was taking it consistently, and therefore will change her dulera back to Qvar.  If she feels this is not working as well, we can always restart dulera

## 2013-03-13 NOTE — Patient Instructions (Addendum)
Stop dulera, and restart qvar  2 inhalations am AND pm everyday!!  Even if you feel good.  Rinse mouth well.  Continue working on your exercise program. followup with me in 6mos, but call if having breathing issues.

## 2013-03-13 NOTE — Progress Notes (Signed)
  Subjective:    Patient ID: Shannon Parsons, female    DOB: 11-08-1940, 72 y.o.   MRN: [id number]  HPI Patient comes in today for followup of her known chronic obstructive asthma.  She unfortunately quit using her Qvar, and then developed what sounds like an acute asthma exacerbation.  She was treated by her primary care physician, and started on dulera.  Even this she is not using on a regular basis, but feels better.  She denies any significant cough or mucus production.   Review of Systems  Constitutional: Negative for fever and unexpected weight change.  HENT: Positive for postnasal drip. Negative for ear pain, nosebleeds, congestion, sore throat, rhinorrhea, sneezing, trouble swallowing, dental problem and sinus pressure.   Eyes: Negative for redness and itching.  Respiratory: Positive for shortness of breath. Negative for cough, chest tightness and wheezing.   Cardiovascular: Negative for palpitations and leg swelling.  Gastrointestinal: Negative for nausea and vomiting.  Genitourinary: Negative for dysuria.  Musculoskeletal: Negative for joint swelling.  Skin: Negative for rash.  Neurological: Negative for headaches.  Hematological: Does not bruise/bleed easily.  Psychiatric/Behavioral: Negative for dysphoric mood. The patient is not nervous/anxious.        Objective:   Physical Exam Well-developed female in no acute distress Nose without purulence or discharge noted Neck without lymphadenopathy or thyromegaly Chest with clear breath sounds, no wheezing Cardiac exam with regular rate and rhythm Lower extremities without edema, no cyanosis Alert and oriented, moves all 4 extremities.       Assessment & Plan:

## 2013-04-06 ENCOUNTER — Ambulatory Visit: Payer: Medicare Other | Admitting: Pulmonary Disease

## 2013-04-16 ENCOUNTER — Telehealth: Payer: Self-pay | Admitting: Pulmonary Disease

## 2013-04-16 NOTE — Telephone Encounter (Signed)
It's fine to take the shingles vaccine though we don't carry it here

## 2013-04-16 NOTE — Telephone Encounter (Signed)
Pt returned call. Shannon Parsons  

## 2013-04-16 NOTE — Telephone Encounter (Signed)
lmtcb x1 for pt to make her aware

## 2013-04-16 NOTE — Telephone Encounter (Signed)
I spoke with pt. She is wanting to get shingles vaccine. She is taking QVAR so she was told to see if there would be a problem for her to get shingles vaccine or if she would have any complications d/t being on QVAR. Will ask our doc of the day to see. Please advise MW as Jonathon Bellows is out of the office thanks

## 2013-04-16 NOTE — Telephone Encounter (Signed)
Pt aware and needed nothing further 

## 2013-07-23 ENCOUNTER — Ambulatory Visit: Payer: Self-pay | Admitting: Podiatry

## 2013-08-29 ENCOUNTER — Other Ambulatory Visit: Payer: Self-pay | Admitting: Internal Medicine

## 2013-08-29 DIAGNOSIS — Z1231 Encounter for screening mammogram for malignant neoplasm of breast: Secondary | ICD-10-CM

## 2013-08-29 LAB — HM MAMMOGRAPHY: HM Mammogram: NORMAL

## 2013-09-10 ENCOUNTER — Ambulatory Visit (HOSPITAL_COMMUNITY)
Admission: RE | Admit: 2013-09-10 | Discharge: 2013-09-10 | Disposition: A | Discharge status: Home / Self Care | Payer: Medicare Other | Source: Ambulatory Visit | Attending: Internal Medicine | Admitting: Internal Medicine

## 2013-09-10 DIAGNOSIS — Z1231 Encounter for screening mammogram for malignant neoplasm of breast: Secondary | ICD-10-CM | POA: Insufficient documentation

## 2013-09-15 DIAGNOSIS — M810 Age-related osteoporosis without current pathological fracture: Secondary | ICD-10-CM | POA: Insufficient documentation

## 2013-09-15 DIAGNOSIS — K2100 Gastro-esophageal reflux disease with esophagitis, without bleeding: Secondary | ICD-10-CM | POA: Insufficient documentation

## 2013-09-15 DIAGNOSIS — R7303 Prediabetes: Secondary | ICD-10-CM | POA: Insufficient documentation

## 2013-09-15 DIAGNOSIS — E559 Vitamin D deficiency, unspecified: Secondary | ICD-10-CM | POA: Insufficient documentation

## 2013-09-15 DIAGNOSIS — M1990 Unspecified osteoarthritis, unspecified site: Secondary | ICD-10-CM | POA: Insufficient documentation

## 2013-09-15 DIAGNOSIS — K5790 Diverticulosis of intestine, part unspecified, without perforation or abscess without bleeding: Secondary | ICD-10-CM | POA: Insufficient documentation

## 2013-09-15 DIAGNOSIS — I959 Hypotension, unspecified: Secondary | ICD-10-CM | POA: Insufficient documentation

## 2013-09-15 DIAGNOSIS — K589 Irritable bowel syndrome without diarrhea: Secondary | ICD-10-CM | POA: Insufficient documentation

## 2013-09-15 DIAGNOSIS — E782 Mixed hyperlipidemia: Secondary | ICD-10-CM | POA: Insufficient documentation

## 2013-09-18 ENCOUNTER — Other Ambulatory Visit: Payer: Self-pay | Admitting: Internal Medicine

## 2013-09-19 ENCOUNTER — Ambulatory Visit: Payer: Self-pay | Admitting: Physician Assistant

## 2013-09-25 ENCOUNTER — Ambulatory Visit: Payer: Medicare Other | Admitting: Pulmonary Disease

## 2013-10-17 ENCOUNTER — Other Ambulatory Visit: Payer: Self-pay | Admitting: Internal Medicine

## 2013-10-17 ENCOUNTER — Other Ambulatory Visit: Payer: Self-pay | Admitting: Physician Assistant

## 2013-12-20 ENCOUNTER — Encounter: Payer: Self-pay | Admitting: Internal Medicine

## 2013-12-20 ENCOUNTER — Ambulatory Visit (INDEPENDENT_AMBULATORY_CARE_PROVIDER_SITE_OTHER): Payer: Medicare Other | Admitting: Internal Medicine

## 2013-12-20 VITALS — BP 122/76 | HR 60 | Temp 97.9°F | Resp 16 | Ht 63.5 in | Wt 146.4 lb

## 2013-12-20 DIAGNOSIS — G4700 Insomnia, unspecified: Secondary | ICD-10-CM

## 2013-12-20 DIAGNOSIS — R7309 Other abnormal glucose: Secondary | ICD-10-CM

## 2013-12-20 DIAGNOSIS — Z79899 Other long term (current) drug therapy: Secondary | ICD-10-CM

## 2013-12-20 DIAGNOSIS — E785 Hyperlipidemia, unspecified: Secondary | ICD-10-CM

## 2013-12-20 DIAGNOSIS — E782 Mixed hyperlipidemia: Secondary | ICD-10-CM

## 2013-12-20 DIAGNOSIS — I10 Essential (primary) hypertension: Secondary | ICD-10-CM

## 2013-12-20 DIAGNOSIS — K589 Irritable bowel syndrome without diarrhea: Secondary | ICD-10-CM

## 2013-12-20 DIAGNOSIS — R7303 Prediabetes: Secondary | ICD-10-CM

## 2013-12-20 DIAGNOSIS — E559 Vitamin D deficiency, unspecified: Secondary | ICD-10-CM

## 2013-12-20 DIAGNOSIS — J45909 Unspecified asthma, uncomplicated: Secondary | ICD-10-CM

## 2013-12-20 LAB — CBC WITH DIFFERENTIAL/PLATELET
Basophils Absolute: 0.1 10*3/uL (ref 0.0–0.1)
Basophils Relative: 1 % (ref 0–1)
Eosinophils Absolute: 0.4 10*3/uL (ref 0.0–0.7)
Eosinophils Relative: 5 % (ref 0–5)
HCT: 42.5 % (ref 36.0–46.0)
HEMOGLOBIN: 13.9 g/dL (ref 12.0–15.0)
LYMPHS ABS: 2.4 10*3/uL (ref 0.7–4.0)
Lymphocytes Relative: 30 % (ref 12–46)
MCH: 27.7 pg (ref 26.0–34.0)
MCHC: 32.7 g/dL (ref 30.0–36.0)
MCV: 84.7 fL (ref 78.0–100.0)
MONOS PCT: 9 % (ref 3–12)
Monocytes Absolute: 0.7 10*3/uL (ref 0.1–1.0)
NEUTROS PCT: 55 % (ref 43–77)
Neutro Abs: 4.3 10*3/uL (ref 1.7–7.7)
Platelets: 268 10*3/uL (ref 150–400)
RBC: 5.02 MIL/uL (ref 3.87–5.11)
RDW: 14.5 % (ref 11.5–15.5)
WBC: 7.9 10*3/uL (ref 4.0–10.5)

## 2013-12-20 LAB — HEMOGLOBIN A1C
Hgb A1c MFr Bld: 5.7 % — ABNORMAL HIGH (ref <5.7)
Mean Plasma Glucose: 117 mg/dL — ABNORMAL HIGH (ref <117)

## 2013-12-20 MED ORDER — ALPRAZOLAM 0.5 MG PO TABS: ORAL_TABLET | ORAL | Status: DC

## 2013-12-20 MED ORDER — HYOSCYAMINE SULFATE 0.125 MG PO TABS: ORAL_TABLET | ORAL | Status: DC

## 2013-12-20 MED ORDER — ALBUTEROL SULFATE HFA 108 (90 BASE) MCG/ACT IN AERS: 1.0000 | INHALATION_SPRAY | RESPIRATORY_TRACT | Status: DC | PRN

## 2013-12-20 NOTE — Patient Instructions (Signed)

## 2013-12-20 NOTE — Progress Notes (Signed)
Patient ID: Shannon Parsons, female   DOB: 02-21-1941, 73 y.o.   MRN: [id number]    This very nice 73 y.o. female presents for 3 month follow up with Hypertension, Hyperlipidemia, Hypothyroidism, Pre-Diabetes and Vitamin D Deficiency.    Patient has had labile HTN for several years and has been monitored expectantly. BP has been controlled at home. Today's BP: 122/76 mmHg . Patient denies any cardiac type chest pain, palpitations, dyspnea/orthopnea/PND, dizziness, claudication, or dependent edema.   Hyperlipidemia is controlled with diet & meds. Last Cholesterol was 183, Triglycerides were 117, HDL 55 and LDL 105 in Oct 2014 - near goal. Patient denies myalgias or other med SE's.    Also, the patient has history of PreDiabetes with A1c 5.9% in 2012 and with last A1c still 5.9% in Oct 2014. Patient denies any symptoms of reactive hypoglycemia, diabetic polys, paresthesias or visual blurring.   Further, Patient has history of Vitamin D Deficiency with last vitamin D of 65 in Oct 2014. Patient supplements vitamin D without any suspected side-effects.  Medication Sig  . atorvastatin (LIPITOR) 20 MG tablet TAKE 1 TABLET BY MOUTH ONCE DAILY.  . beclomethasone (QVAR) 80 MCG/ACT inhaler Inhale 2 puffs into the lungs 2 (two) times daily.  . Cholecalciferol (VITAMIN D) 2000 UNITS CAPS Take 2 capsules by mouth daily.  [name] dicyclomine (BENTYL) 10 MG capsule Take 10 mg by mouth as needed.   [name] ipratropium-albuterol (DUONEB) 0.5-2.5 (3) MG/3ML SOLN Take 3 mLs by nebulization every 6 (six) hours as needed. Pt hasnt used in a year  . levothyroxine (SYNTHROID, LEVOTHROID) 50 MCG tablet TAKE 1 TABLET BY MOUTH ONCE DAILY.  . Magnesium 200 MG TABS Take 1 tablet by mouth 4 (four) times a week.     Allergies  Allergen Reactions  . Actonel [Risedronate Sodium]   . Fosamax [Alendronate]   . Sulfa Antibiotics   . Tetracyclines & Related     PMHx:   Past Medical History  Diagnosis Date  . OSA (obstructive sleep  apnea)   . Hiatal hernia   . IBS (irritable bowel syndrome)   . Fibrocystic breast disease   . Asthma   . Diverticulosis   . Adenomatous colon polyp 1994  . Internal hemorrhoids   . Melanoma     Facial  . Hyperlipidemia   . Hypertension   . GERD (gastroesophageal reflux disease)   . Osteoporosis   . Osteoarthritis   . Vitamin D deficiency   . Prediabetes     FHx:    Reviewed / unchanged  SHx:    Reviewed / unchanged   Systems Review: Constitutional: Denies fever, chills, wt changes, headaches, insomnia, fatigue, night sweats, change in appetite. Eyes: Denies redness, blurred vision, diplopia, discharge, itchy, watery eyes.  ENT: Denies discharge, congestion, post nasal drip, epistaxis, sore throat, earache, hearing loss, dental pain, tinnitus, vertigo, sinus pain, snoring.  CV: Denies chest pain, palpitations, irregular heartbeat, syncope, dyspnea, diaphoresis, orthopnea, PND, claudication, edema. Respiratory: denies cough, dyspnea, DOE, pleurisy, hoarseness, laryngitis, wheezing.  Gastrointestinal: Denies dysphagia, odynophagia, heartburn, reflux, water brash, abdominal pain or cramps, nausea, vomiting, bloating, diarrhea, constipation, hematemesis, melena, hematochezia,  or hemorrhoids. Genitourinary: Denies dysuria, frequency, urgency, nocturia, hesitancy, discharge, hematuria, flank pain. Musculoskeletal: Denies arthralgias, myalgias, stiffness, jt. swelling, pain, limp, strain/sprain.  Skin: Denies pruritus, rash, hives, warts, acne, eczema, change in skin lesion(s). Neuro: No weakness, tremor, incoordination, spasms, paresthesia, or pain. Psychiatric: Denies confusion, memory loss, or sensory loss. Endo: Denies change in weight, skin, hair change.  Heme/Lymph: No excessive bleeding, bruising, orenlarged lymph nodes.   Exam:  BP 122/76  Pulse 60  Temp(Src) 97.9 F (36.6 C) (Temporal)  Resp 16  Ht 5' 3.5" (1.613 m)  Wt 146 lb 6.4 oz (66.407 kg)  BMI 25.52  kg/m2  Appears well nourished - in no distress. Eyes: PERRLA, EOMs, conjunctiva no swelling or erythema. Sinuses: No frontal/maxillary tenderness ENT/Mouth: EAC's clear, TM's nl w/o erythema, bulging. Nares clear w/o erythema, swelling, exudates. Oropharynx clear without erythema or exudates. Oral hygiene is good. Tongue normal, non obstructing. Hearing intact.  Neck: Supple. Thyroid nl. Car 2+/2+ without bruits, nodes or JVD. Chest: Respirations nl with BS clear & equal w/o rales, rhonchi, wheezing or stridor.  Cor: Heart sounds normal w/ regular rate and rhythm without sig. murmurs, gallops, clicks, or rubs. Peripheral pulses normal and equal  without edema.  Abdomen: Soft & bowel sounds normal. Non-tender w/o guarding, rebound, hernias, masses, or organomegaly.  Lymphatics: Unremarkable.  Musculoskeletal: Full ROM all peripheral extremities, joint stability, 5/5 strength, and normal gait.  Skin: Warm, dry without exposed rashes, lesions, ecchymosis apparent.  Neuro: Cranial nerves intact, reflexes equal bilaterally. Sensory-motor testing grossly intact. Mild titubation of the head and slight fine tremor of the hands. Tendon reflexes grossly intact.  Pysch: Alert & oriented x 3. Insight and judgement nl & appropriate. No ideations.  Assessment and Plan:  1. Hypertension - Continue monitor blood pressure at home. Continue diet/meds same.  2. Hyperlipidemia - Continue diet/meds, exercise,& lifestyle modifications. Continue monitor periodic cholesterol/liver & renal functions   3. Pre-diabetes/Insulin Resistance - Continue diet, exercise, lifestyle modifications. Monitor appropriate labs.  4. Vitamin D Deficiency - Continue supplementation.  5. Essential Tremor  Recommended regular exercise, BP monitoring, weight control, and discussed med and SE's. Recommended labs to assess and monitor clinical status. Further disposition pending results of labs.

## 2013-12-21 LAB — BASIC METABOLIC PANEL WITH GFR
BUN: 13 mg/dL (ref 6–23)
CO2: 29 mEq/L (ref 19–32)
Calcium: 9.5 mg/dL (ref 8.4–10.5)
Chloride: 101 mEq/L (ref 96–112)
Creat: 0.76 mg/dL (ref 0.50–1.10)
GFR, Est Non African American: 80 mL/min
GLUCOSE: 81 mg/dL (ref 70–99)
Potassium: 4.6 mEq/L (ref 3.5–5.3)
SODIUM: 135 meq/L (ref 135–145)

## 2013-12-21 LAB — LIPID PANEL
CHOL/HDL RATIO: 3.2 ratio
CHOLESTEROL: 174 mg/dL (ref 0–200)
HDL: 55 mg/dL (ref >39)
LDL Cholesterol: 104 mg/dL — ABNORMAL HIGH (ref 0–99)
Triglycerides: 75 mg/dL (ref <150)
VLDL: 15 mg/dL (ref 0–40)

## 2013-12-21 LAB — HEPATIC FUNCTION PANEL
ALT: 16 U/L (ref 0–35)
AST: 28 U/L (ref 0–37)
Albumin: 3.8 g/dL (ref 3.5–5.2)
Alkaline Phosphatase: 65 U/L (ref 39–117)
BILIRUBIN DIRECT: 0.1 mg/dL (ref 0.0–0.3)
BILIRUBIN TOTAL: 0.5 mg/dL (ref 0.2–1.2)
Indirect Bilirubin: 0.4 mg/dL (ref 0.2–1.2)
Total Protein: 6.4 g/dL (ref 6.0–8.3)

## 2013-12-21 LAB — MAGNESIUM: Magnesium: 1.9 mg/dL (ref 1.5–2.5)

## 2013-12-21 LAB — TSH: TSH: 1.927 u[IU]/mL (ref 0.350–4.500)

## 2013-12-21 LAB — VITAMIN D 25 HYDROXY (VIT D DEFICIENCY, FRACTURES): VIT D 25 HYDROXY: 83 ng/mL (ref 30–89)

## 2013-12-21 LAB — INSULIN, FASTING: Insulin fasting, serum: 8 u[IU]/mL (ref 3–28)

## 2013-12-27 ENCOUNTER — Telehealth: Payer: Self-pay | Admitting: *Deleted

## 2013-12-27 NOTE — Telephone Encounter (Signed)
Patient aware of normal labs per Dr Melford Aase

## 2014-01-01 ENCOUNTER — Ambulatory Visit (HOSPITAL_COMMUNITY)
Admission: RE | Admit: 2014-01-01 | Discharge: 2014-01-01 | Disposition: A | Discharge status: Home / Self Care | Payer: Medicare Other | Source: Ambulatory Visit | Attending: Emergency Medicine | Admitting: Emergency Medicine

## 2014-01-01 ENCOUNTER — Encounter: Payer: Self-pay | Admitting: Emergency Medicine

## 2014-01-01 ENCOUNTER — Ambulatory Visit (INDEPENDENT_AMBULATORY_CARE_PROVIDER_SITE_OTHER): Payer: Medicare Other | Admitting: Emergency Medicine

## 2014-01-01 VITALS — BP 108/72 | HR 58 | Temp 98.0°F | Resp 16 | Ht 63.5 in | Wt 143.0 lb

## 2014-01-01 DIAGNOSIS — K5732 Diverticulitis of large intestine without perforation or abscess without bleeding: Secondary | ICD-10-CM

## 2014-01-01 DIAGNOSIS — R1032 Left lower quadrant pain: Secondary | ICD-10-CM | POA: Insufficient documentation

## 2014-01-01 DIAGNOSIS — K5730 Diverticulosis of large intestine without perforation or abscess without bleeding: Secondary | ICD-10-CM | POA: Insufficient documentation

## 2014-01-01 DIAGNOSIS — R109 Unspecified abdominal pain: Secondary | ICD-10-CM

## 2014-01-01 DIAGNOSIS — J984 Other disorders of lung: Secondary | ICD-10-CM | POA: Insufficient documentation

## 2014-01-01 MED ORDER — CIPROFLOXACIN HCL 500 MG PO TABS: 500.0000 mg | ORAL_TABLET | Freq: Two times a day (BID) | ORAL | Status: AC

## 2014-01-01 MED ORDER — IOHEXOL 300 MG/ML  SOLN
100.0000 mL | Freq: Once | INTRAMUSCULAR | Status: AC | PRN
  Administered 2014-01-01: 100 mL via INTRAVENOUS

## 2014-01-01 NOTE — Progress Notes (Signed)
Subjective:    Patient ID: Shannon Parsons, female    DOB: [birth year], 72 y.o.   MRN: [id number]  HPI Comments: 73 yo WF notes after BM last Friday she noted abdomen swelling and Left side abdomen pain. She denies obstruction history or chronic constipation. She notes abdomen swelling occurs on/ off x several months,  last episode was 6 weeks ago with out pain. Colonoscopy was 08/2011 with Dr Fuller Plan with benign polyp and diverticulosis. She feels like stomach is going to bust open.     Medication List       This list is accurate as of: 01/01/14 11:09 AM.  Always use your most recent med list.               albuterol 108 (90 BASE) MCG/ACT inhaler  Commonly known as:  PROVENTIL HFA;VENTOLIN HFA  Inhale 1-2 puffs into the lungs every 4 (four) hours as needed for wheezing.     ALPRAZolam 0.5 MG tablet  Commonly known as:  XANAX  Take 1/2 to 1 tablet at bedtime as needed for sleep     atorvastatin 20 MG tablet  Commonly known as:  LIPITOR  TAKE 1 TABLET BY MOUTH ONCE DAILY.     beclomethasone 80 MCG/ACT inhaler  Commonly known as:  QVAR  Inhale 2 puffs into the lungs 2 (two) times daily.     dicyclomine 10 MG capsule  Commonly known as:  BENTYL  Take 10 mg by mouth as needed.     hyoscyamine 0.125 MG tablet  Commonly known as:  LEVSIN, ANASPAZ  Take 1 to 2 tablets every 4 hours as needed for nausea, cramping, bloating or diarrhea     levothyroxine 50 MCG tablet  Commonly known as:  SYNTHROID, LEVOTHROID  TAKE 1 TABLET BY MOUTH ONCE DAILY.     Magnesium 200 MG Tabs  Take 1 tablet by mouth 4 (four) times a week.     Vitamin D 2000 UNITS Caps  Take 2 capsules by mouth daily.       Allergies  Allergen Reactions  . Actonel [Risedronate Sodium]   . Fosamax [Alendronate]   . Sulfa Antibiotics   . Tetracyclines & Related    Past Medical History  Diagnosis Date  . OSA (obstructive sleep apnea)   . Hiatal hernia   . IBS (irritable bowel syndrome)   . Fibrocystic  breast disease   . Asthma   . Diverticulosis   . Adenomatous colon polyp 1994  . Internal hemorrhoids   . Melanoma     Facial  . Hyperlipidemia   . Hypertension   . GERD (gastroesophageal reflux disease)   . Osteoporosis   . Osteoarthritis   . Vitamin D deficiency   . Prediabetes   . Unspecified hypothyroidism       Review of Systems  Gastrointestinal: Positive for abdominal pain.  All other systems reviewed and are negative.  BP 108/72  Pulse 58  Temp(Src) 98 F (36.7 C) (Temporal)  Resp 16  Ht 5' 3.5" (1.613 m)  Wt 143 lb (64.864 kg)  BMI 24.93 kg/m2     Objective:   Physical Exam  Nursing note and vitals reviewed. Constitutional: She is oriented to person, place, and time. She appears well-developed and well-nourished.  HENT:  Head: Normocephalic and atraumatic.  Eyes: Conjunctivae are normal.  Neck: Normal range of motion.  Cardiovascular: Normal rate, regular rhythm, normal heart sounds and intact distal pulses.   Pulmonary/Chest: Effort normal and breath sounds normal.  Abdominal: Soft. Bowel sounds are normal. She exhibits no distension and no mass. There is tenderness. There is no rebound and no guarding.  LLQ greatest but diffuse pain over rest of abdomen  Musculoskeletal: Normal range of motion.  Neurological: She is alert and oriented to person, place, and time.  Skin: Skin is warm and dry.  Psychiatric: She has a normal mood and affect. Judgment normal.          Assessment & Plan:  Abdomen pain ? Diverticulitis- Cipro 500 mg AD, Ct ABD/ Pelvis to evaluate, bland diet, w/c if SX increase or ER. If all negative needs GI re-eval

## 2014-01-01 NOTE — Patient Instructions (Addendum)
Diverticulitis Small pockets or "bubbles" can develop in the wall of the intestine. Diverticulitis is when those pockets become infected and inflamed. This causes stomach pain (usually on the left side). HOME CARE  Take all medicine as told by your doctor.  Try a clear liquid diet (broth, tea, or water) for as long as told by your doctor.  Keep all follow-up visits with your doctor.  You may be put on a low-fiber diet once you start feeling better. Here are foods that have low-fiber:  White breads, cereals, rice, and pasta.  Cooked fruits and vegetables or soft fresh fruits and vegetables without the skin.  Ground or well-cooked tender beef, ham, veal, lamb, pork, or poultry.  Eggs and seafood.  After you are doing well on the low-fiber diet, you may be put on a high-fiber diet. Here are ways to increase your fiber:  Choose whole-grain breads, cereals, pasta, and brown rice.  Choose fruits and vegetables with skin on. Do not overcook the vegetables.  Choose nuts, seeds, legumes, dried peas, beans, and lentils.  Look for food products that have more than 3 grams of fiber per serving on the food label. GET HELP RIGHT AWAY IF:  Your pain does not get better or gets worse.  You have trouble eating food.  You are not pooping (having bowel movements) like normal.  You have a temperature by mouth above 102 F (38.9 C), not controlled by medicine.  You keep throwing up (vomiting).  You have bloody or black, tarry poop (stools).  You are getting worse and not better. MAKE SURE YOU:   Understand these instructions.  Will watch your condition.  Will get help right away if you are not doing well or get worse. Document Released: 02/16/2008 Document Revised: 11/22/2011 Document Reviewed: 07/21/2009 [facility] Patient Information 2014 [place], [place].  Small Bowel Obstruction A small bowel obstruction means something is blocking the small intestine. The small intestine is the  long tube that connects the stomach to the colon. Treatment depends on what is causing the problem. Treatment also depends on how bad the problem is. HOME CARE Your doctor may let you go home if your small bowel is not completely blocked.  Rest.  Follow your diet as told by your doctor.  Only drink clear liquids until you start to get better.  Avoid solid foods as told by your doctor.  Only take medicine as told by your doctor. GET HELP RIGHT AWAY IF:  You have pain or cramps that get worse.  You throw up (vomit) blood.  You feel sick to your stomach (nauseous), or you cannot stop throwing up.  You cannot drink fluids.  You feel confused.  You feel dry or thirsty (dehydrated).  Your belly gets more puffy (bloated).  You have chills.  You have a fever.  You feel weak, or you pass out (faint). MAKE SURE YOU:  Understand these instructions.  Will watch your condition.  Will get help right away if you are not doing well or get worse. Document Released: 10/07/2004 Document Revised: 11/22/2011 Document Reviewed: 12/08/2010 [facility] Patient Information 2014 [place], [place].

## 2014-01-07 ENCOUNTER — Other Ambulatory Visit: Payer: Self-pay | Admitting: Emergency Medicine

## 2014-01-07 ENCOUNTER — Telehealth: Payer: Self-pay | Admitting: *Deleted

## 2014-01-07 DIAGNOSIS — R7309 Other abnormal glucose: Secondary | ICD-10-CM

## 2014-01-07 NOTE — Telephone Encounter (Signed)
PT is calling asking if she can see a dietician ? About stomach issue? Things she should avoid eating ect?

## 2014-01-07 NOTE — Telephone Encounter (Signed)
Please call her back and tell her I will check with referral person and see if insurance covers and we will let her known.

## 2014-01-07 NOTE — Telephone Encounter (Signed)
PT AWARE  

## 2014-01-08 NOTE — Telephone Encounter (Signed)
Pt has been referred to [facility]. They will not accept as new pt if no coverage from insurance.

## 2014-01-11 ENCOUNTER — Other Ambulatory Visit: Payer: Self-pay | Admitting: Emergency Medicine

## 2014-01-14 ENCOUNTER — Encounter: Payer: Self-pay | Admitting: Dietician

## 2014-01-14 ENCOUNTER — Encounter: Payer: Medicare Other | Attending: Internal Medicine | Admitting: Dietician

## 2014-01-14 VITALS — Ht 63.5 in | Wt 142.6 lb

## 2014-01-14 DIAGNOSIS — E119 Type 2 diabetes mellitus without complications: Secondary | ICD-10-CM | POA: Insufficient documentation

## 2014-01-14 DIAGNOSIS — Z713 Dietary counseling and surveillance: Secondary | ICD-10-CM | POA: Insufficient documentation

## 2014-01-14 DIAGNOSIS — K5730 Diverticulosis of large intestine without perforation or abscess without bleeding: Secondary | ICD-10-CM | POA: Insufficient documentation

## 2014-01-14 DIAGNOSIS — K5790 Diverticulosis of intestine, part unspecified, without perforation or abscess without bleeding: Secondary | ICD-10-CM

## 2014-01-14 DIAGNOSIS — K589 Irritable bowel syndrome without diarrhea: Secondary | ICD-10-CM | POA: Insufficient documentation

## 2014-01-14 DIAGNOSIS — R7303 Prediabetes: Secondary | ICD-10-CM

## 2014-01-14 NOTE — Progress Notes (Signed)
Appt start time: 1400 end time:  1500.  Assessment:  Patient was seen on 01/14/14 for individual diabetes education. Pt primary concern today is resolving diverticulosis/IBS flare-ups.   Current HbA1c: 5.7  Preferred Learning Style:  No preference indicated   Learning Readiness:   Ready  MEDICATIONS: see list.   DIETARY INTAKE: Usual eating pattern includes 3 meals and 2 snacks per day. Everyday foods include oatmeal, tea, coffee.  Avoided foods include none noted.    24-hr recall:  B ( AM): oatmeal with walnuts and blueberries, brown sugar. Otherwise a different cereal or boiled egg with tomato or whole wheat bagel with cream cheese, butter. Coffee with flavored vanilla creamer. Snk ( AM): peanut butter crackers, granola and nuts bar, some chocolate milk or green tea with honey  L ( PM): homemade milk shake (ice cream, chocolate milk, peanut butter, banana), [employer] hamburger (Jr size), leftovers from pervious dinner. Decaf tea with sugar.  Snk ( PM): nuts D ( PM): meat (chix, some red meats- hamburger, pork chop, fish- salmon, white fishes), veg- green beans, pinto beans, corn, navy beans, broccoli often, cauliflower, frozen mixed vegetables; sometimes a fruit salad. Occasional pizza with side salad (ranch, lightly dressed); sweet tea, decaf, or water.  Snk ( PM): small bowl of ice cream with chocolate sauce and whipped cream; jello when has a Diverticulitis flare. water Beverages: water, sweet tea, occasional pepsi, coffee, no fruit juice, EtOH very rarely  Usual physical activity: walk 1.5 miles 6 days per week, usually takes 40 minutes. Sometimes an exercise video. No active hobbies noted.  Progress Towards Goal(s):  In progress.   Nutritional Diagnosis:  NI-5.8.2 Excessive carbohydrate intake As related to pre-diabetes, high sugar intake.  As evidenced by HgbA1c 5.7, frequent consumption of sweet tea and dessert items.    Intervention:  Nutrition counseling  provided.  Discussed diabetes disease process and treatment options.  Discussed physiology of diabetes and role of obesity on insulin resistance.  Encouraged moderate weight reduction to improve glucose levels.  Discussed role of medications and diet in glucose control  Provided education on macronutrients on glucose levels.  Provided education on carb counting, importance of regularly scheduled meals/snacks, and meal planning  Discussed effects of physical activity on glucose levels and long-term glucose control.  Recommended 150 minutes of physical activity/week.  Reviewed patient medications.  Discussed role of medication on blood glucose and possible side effects  Discussed blood glucose monitoring and interpretation.  Discussed recommended target ranges and individual ranges.    Described short-term complications: hyper- and hypo-glycemia.  Discussed causes,symptoms, and treatment options.  Discussed prevention, detection, and treatment of long-term complications.  Discussed the role of prolonged elevated glucose levels on body systems.  Discussed role of stress on blood glucose levels and discussed strategies to manage psychosocial issues.  Discussed recommendations for long-term diabetes self-care.  Established checklist for medical, dental, and emotional self-care.  Teaching Method Utilized:  Visual Auditory  Handouts given during visit include:  Probiotic diet  Best Pro, Fat, and CHO rich foods for heart health  Reviewed and Approved Supplements  Barriers to learning/adherence to lifestyle change: preference for sweet drinks and dessert items  Diabetes self-care support plan:  Pt's husband is here today to learn about changes to resolve diverticulosis  Based on pt's HgbA1c and pt's reports, pre-diabetes is not the most pressing concern. RD discussed the physiology of insulin resistance, and the substantial benefits from exercise, and recommended an increase to 60  minutes per day exercise. In  regards to diverticulosis, RD spent the majority of the appointment discussing the probiotic diet to be followed when no flare-ups are present, with focus on reducing animal fats and sugar, incorporating prebiotic high fiber foods and probiotic dairy (Kefir ideally) as well as a probiotic supplement. RD also discussed the best foods to eat during a flare-up to limit symptoms, demonstrating the low residue diet (focus on refined starches, lean proteins, well-cooked vegetables and skinless fruit).   Demonstrated degree of understanding via:  Teach Back   Monitoring/Evaluation:  Dietary intake, exercise, portion control, and body weight in 2 month(s).

## 2014-01-15 ENCOUNTER — Ambulatory Visit (INDEPENDENT_AMBULATORY_CARE_PROVIDER_SITE_OTHER): Payer: Medicare Other | Admitting: Internal Medicine

## 2014-01-15 ENCOUNTER — Encounter: Payer: Self-pay | Admitting: Internal Medicine

## 2014-01-15 VITALS — BP 114/66 | HR 64 | Temp 97.5°F | Resp 16 | Ht 63.5 in | Wt 141.0 lb

## 2014-01-15 DIAGNOSIS — J0410 Acute tracheitis without obstruction: Secondary | ICD-10-CM

## 2014-01-15 DIAGNOSIS — J029 Acute pharyngitis, unspecified: Secondary | ICD-10-CM

## 2014-01-15 DIAGNOSIS — J019 Acute sinusitis, unspecified: Secondary | ICD-10-CM

## 2014-01-15 MED ORDER — HYDROCODONE-ACETAMINOPHEN 5-325 MG PO TABS: ORAL_TABLET | ORAL | Status: AC

## 2014-01-15 MED ORDER — PREDNISONE 20 MG PO TABS: 20.0000 mg | ORAL_TABLET | ORAL | Status: DC

## 2014-01-15 MED ORDER — AZITHROMYCIN 250 MG PO TABS: ORAL_TABLET | ORAL | Status: DC

## 2014-01-15 NOTE — Patient Instructions (Signed)

## 2014-01-15 NOTE — Progress Notes (Signed)
Subjective:    Patient ID: Shannon Parsons, female    DOB: 31-Aug-1941, 73 y.o.   MRN: [id number]  Sinusitis This is a new problem. The current episode started in the past 7 days. The problem has been waxing and waning since onset. There has been no fever. The pain is mild. Associated symptoms include congestion, coughing, ear pain, headaches, a hoarse voice, neck pain, sinus pressure, a sore throat and swollen glands. Pertinent negatives include no chills, diaphoresis, shortness of breath or sneezing. Past treatments include nothing. The treatment provided no relief.  Sore Throat  This is a new problem. The current episode started in the past 7 days. The problem has been waxing and waning. Neither side of throat is experiencing more pain than the other. There has been no fever. The pain is mild. Associated symptoms include congestion, coughing, ear pain, headaches, a hoarse voice, neck pain and swollen glands. Pertinent negatives include no drooling, ear discharge, shortness of breath, stridor or trouble swallowing. She has had no exposure to strep. She has tried acetaminophen for the symptoms. The treatment provided no relief.     Medication List       This list is accurate as of: 01/15/14 10:27 AM.  Always use your most recent med list.               albuterol 108 (90 BASE) MCG/ACT inhaler  Commonly known as:  PROVENTIL HFA;VENTOLIN HFA  Inhale 1-2 puffs into the lungs every 4 (four) hours as needed for wheezing.     ALPRAZolam 0.5 MG tablet  Commonly known as:  XANAX  Take 1/2 to 1 tablet at bedtime as needed for sleep     atorvastatin 20 MG tablet  Commonly known as:  LIPITOR  TAKE 1 TABLET BY MOUTH ONCE DAILY.     azithromycin 250 MG tablet  Commonly known as:  ZITHROMAX  Take 2 tablets (500 mg) on  Day 1,  followed by 1 tablet (250 mg) once daily on Days 2 through 5.     beclomethasone 80 MCG/ACT inhaler  Commonly known as:  QVAR  Inhale 2 puffs into the lungs 2 (two)  times daily.     dicyclomine 10 MG capsule  Commonly known as:  BENTYL  Take 10 mg by mouth as needed.     HYDROcodone-acetaminophen 5-325 MG per tablet  Commonly known as:  NORCO  Take 1/2 to 1 tablet every 3 to 4 hours as needed for cough or pain     hyoscyamine 0.125 MG tablet  Commonly known as:  LEVSIN, ANASPAZ  Take 1 to 2 tablets every 4 hours as needed for nausea, cramping, bloating or diarrhea     levothyroxine 50 MCG tablet  Commonly known as:  SYNTHROID, LEVOTHROID  TAKE 1 TABLET BY MOUTH ONCE DAILY.     Magnesium 200 MG Tabs  Take 1 tablet by mouth 4 (four) times a week.     predniSONE 20 MG tablet  Commonly known as:  DELTASONE  Take 1 tablet (20 mg total) by mouth See admin instructions. 1 tab 3 x day for 3 days, then 1 tab 2 x day for 3 days, then 1 tab 1 x day for 5 days     Vitamin D 2000 UNITS Caps  Take 2 capsules by mouth daily.       Allergies  Allergen Reactions  . Actonel [Risedronate Sodium]   . Fosamax [Alendronate]   . Sulfa Antibiotics   . Tetracyclines &  Related    Past Medical History  Diagnosis Date  . OSA (obstructive sleep apnea)   . Hiatal hernia   . IBS (irritable bowel syndrome)   . Fibrocystic breast disease   . Asthma   . Diverticulosis   . Adenomatous colon polyp 1994  . Internal hemorrhoids   . Melanoma     Facial  . Hyperlipidemia   . Hypertension   . GERD (gastroesophageal reflux disease)   . Osteoporosis   . Osteoarthritis   . Vitamin D deficiency   . Prediabetes   . Unspecified hypothyroidism        Review of Systems  Constitutional: Negative for fever, chills and diaphoresis.  HENT: Positive for congestion, ear pain, hoarse voice, sinus pressure and sore throat. Negative for drooling, ear discharge, facial swelling, hearing loss, mouth sores, nosebleeds, postnasal drip, rhinorrhea, sneezing, tinnitus, trouble swallowing and voice change.   Eyes: Negative.   Respiratory: Positive for cough. Negative for  choking, chest tightness, shortness of breath, wheezing and stridor.   Cardiovascular: Negative.   Gastrointestinal: Negative.   Genitourinary: Negative.   Musculoskeletal: Positive for neck pain. Negative for myalgias and neck stiffness.  Neurological: Positive for headaches. Negative for speech difficulty, weakness, light-headedness and numbness.  Hematological: Negative.        Objective:   Physical Exam  Constitutional: She is oriented to person, place, and time. She appears well-nourished. No distress.  HENT:  Head: Atraumatic.  Right Ear: External ear normal.  Left Ear: External ear normal.  Nose: Nose normal.  Mouth/Throat: Oropharynx is clear and moist. No oropharyngeal exudate.  Eyes: EOM are normal. Pupils are equal, round, and reactive to light. Right eye exhibits no discharge. Left eye exhibits no discharge.  Neck: Normal range of motion. Neck supple. No JVD present. No thyromegaly present.  Cardiovascular: Normal rate and regular rhythm.   No murmur heard. Pulmonary/Chest: Effort normal. No respiratory distress. She has no wheezes. She has rales. She exhibits no tenderness.  Lymphadenopathy:    She has no cervical adenopathy.  Neurological: She is alert and oriented to person, place, and time. No cranial nerve deficit. Coordination normal.  Skin: Skin is warm and dry. No rash noted. She is not diaphoretic. No erythema. No pallor.    Assessment & Plan:   1. Acute sinusitis, unspecified  - azithromycin (ZITHROMAX) 250 MG tablet; Take 2 tablets (500 mg) on  Day 1,  followed by 1 tablet (250 mg) once daily on Days 2 through 5.  Dispense: 6 each; Refill: 1 - HYDROcodone-acetaminophen (NORCO) 5-325 MG per tablet; Take 1/2 to 1 tablet every 3 to 4 hours as needed for cough or pain  Dispense: 50 tablet; Refill:  - Prednisone 20 mg #20 - taper  2. Acute pharyngitis  3. Acute tracheitis

## 2014-02-27 ENCOUNTER — Encounter (HOSPITAL_COMMUNITY): Payer: Self-pay | Admitting: Emergency Medicine

## 2014-02-27 ENCOUNTER — Emergency Department (HOSPITAL_COMMUNITY): Payer: Medicare Other

## 2014-02-27 ENCOUNTER — Other Ambulatory Visit: Payer: Self-pay

## 2014-02-27 ENCOUNTER — Observation Stay (HOSPITAL_COMMUNITY)
Admission: EM | Admit: 2014-02-27 | Discharge: 2014-02-28 | Disposition: A | Discharge status: Home / Self Care | Payer: Medicare Other | Attending: Internal Medicine | Admitting: Internal Medicine

## 2014-02-27 DIAGNOSIS — Z860100 Personal history of colon polyps, unspecified: Secondary | ICD-10-CM | POA: Insufficient documentation

## 2014-02-27 DIAGNOSIS — R7303 Prediabetes: Secondary | ICD-10-CM | POA: Diagnosis present

## 2014-02-27 DIAGNOSIS — R109 Unspecified abdominal pain: Secondary | ICD-10-CM | POA: Insufficient documentation

## 2014-02-27 DIAGNOSIS — E559 Vitamin D deficiency, unspecified: Secondary | ICD-10-CM

## 2014-02-27 DIAGNOSIS — Z8719 Personal history of other diseases of the digestive system: Secondary | ICD-10-CM | POA: Insufficient documentation

## 2014-02-27 DIAGNOSIS — F411 Generalized anxiety disorder: Secondary | ICD-10-CM | POA: Diagnosis present

## 2014-02-27 DIAGNOSIS — IMO0002 Reserved for concepts with insufficient information to code with codable children: Secondary | ICD-10-CM | POA: Insufficient documentation

## 2014-02-27 DIAGNOSIS — K2100 Gastro-esophageal reflux disease with esophagitis, without bleeding: Secondary | ICD-10-CM | POA: Diagnosis present

## 2014-02-27 DIAGNOSIS — Z8742 Personal history of other diseases of the female genital tract: Secondary | ICD-10-CM | POA: Insufficient documentation

## 2014-02-27 DIAGNOSIS — Z792 Long term (current) use of antibiotics: Secondary | ICD-10-CM | POA: Insufficient documentation

## 2014-02-27 DIAGNOSIS — E785 Hyperlipidemia, unspecified: Secondary | ICD-10-CM | POA: Insufficient documentation

## 2014-02-27 DIAGNOSIS — I959 Hypotension, unspecified: Secondary | ICD-10-CM | POA: Diagnosis present

## 2014-02-27 DIAGNOSIS — J449 Chronic obstructive pulmonary disease, unspecified: Secondary | ICD-10-CM

## 2014-02-27 DIAGNOSIS — R079 Chest pain, unspecified: Secondary | ICD-10-CM

## 2014-02-27 DIAGNOSIS — M549 Dorsalgia, unspecified: Secondary | ICD-10-CM | POA: Insufficient documentation

## 2014-02-27 DIAGNOSIS — Z8669 Personal history of other diseases of the nervous system and sense organs: Secondary | ICD-10-CM | POA: Insufficient documentation

## 2014-02-27 DIAGNOSIS — K589 Irritable bowel syndrome without diarrhea: Secondary | ICD-10-CM | POA: Diagnosis present

## 2014-02-27 DIAGNOSIS — J4489 Other specified chronic obstructive pulmonary disease: Secondary | ICD-10-CM | POA: Diagnosis present

## 2014-02-27 DIAGNOSIS — E782 Mixed hyperlipidemia: Secondary | ICD-10-CM | POA: Diagnosis present

## 2014-02-27 DIAGNOSIS — I10 Essential (primary) hypertension: Secondary | ICD-10-CM | POA: Insufficient documentation

## 2014-02-27 DIAGNOSIS — R7309 Other abnormal glucose: Secondary | ICD-10-CM

## 2014-02-27 DIAGNOSIS — Z8249 Family history of ischemic heart disease and other diseases of the circulatory system: Secondary | ICD-10-CM

## 2014-02-27 DIAGNOSIS — E039 Hypothyroidism, unspecified: Secondary | ICD-10-CM | POA: Insufficient documentation

## 2014-02-27 DIAGNOSIS — M1990 Unspecified osteoarthritis, unspecified site: Secondary | ICD-10-CM | POA: Insufficient documentation

## 2014-02-27 DIAGNOSIS — Z79899 Other long term (current) drug therapy: Secondary | ICD-10-CM | POA: Insufficient documentation

## 2014-02-27 DIAGNOSIS — Z85820 Personal history of malignant melanoma of skin: Secondary | ICD-10-CM | POA: Insufficient documentation

## 2014-02-27 DIAGNOSIS — R072 Precordial pain: Principal | ICD-10-CM | POA: Insufficient documentation

## 2014-02-27 DIAGNOSIS — J45909 Unspecified asthma, uncomplicated: Secondary | ICD-10-CM | POA: Insufficient documentation

## 2014-02-27 HISTORY — DX: Family history of ischemic heart disease and other diseases of the circulatory system: Z82.49

## 2014-02-27 LAB — HEPATIC FUNCTION PANEL
ALT: 17 U/L (ref 0–35)
AST: 34 U/L (ref 0–37)
Albumin: 3.7 g/dL (ref 3.5–5.2)
Alkaline Phosphatase: 67 U/L (ref 39–117)
BILIRUBIN TOTAL: 0.3 mg/dL (ref 0.3–1.2)
Total Protein: 7.2 g/dL (ref 6.0–8.3)

## 2014-02-27 LAB — CREATININE, SERUM
Creatinine, Ser: 0.9 mg/dL (ref 0.50–1.10)
GFR calc non Af Amer: 63 mL/min — ABNORMAL LOW (ref >90)
GFR, EST AFRICAN AMERICAN: 73 mL/min — AB (ref >90)

## 2014-02-27 LAB — BASIC METABOLIC PANEL
BUN: 15 mg/dL (ref 6–23)
CHLORIDE: 103 meq/L (ref 96–112)
CO2: 26 meq/L (ref 19–32)
CREATININE: 0.84 mg/dL (ref 0.50–1.10)
Calcium: 9.7 mg/dL (ref 8.4–10.5)
GFR calc non Af Amer: 69 mL/min — ABNORMAL LOW (ref >90)
GFR, EST AFRICAN AMERICAN: 80 mL/min — AB (ref >90)
GLUCOSE: 137 mg/dL — AB (ref 70–99)
POTASSIUM: 3.9 meq/L (ref 3.7–5.3)
Sodium: 141 mEq/L (ref 137–147)

## 2014-02-27 LAB — CBC
HEMATOCRIT: 45 % (ref 36.0–46.0)
HEMATOCRIT: 40.9 % (ref 36.0–46.0)
HEMOGLOBIN: 14.7 g/dL (ref 12.0–15.0)
Hemoglobin: 13.4 g/dL (ref 12.0–15.0)
MCH: 28.7 pg (ref 26.0–34.0)
MCH: 28.3 pg (ref 26.0–34.0)
MCHC: 32.7 g/dL (ref 30.0–36.0)
MCHC: 32.8 g/dL (ref 30.0–36.0)
MCV: 87.9 fL (ref 78.0–100.0)
MCV: 86.5 fL (ref 78.0–100.0)
PLATELETS: 240 10*3/uL (ref 150–400)
Platelets: 233 10*3/uL (ref 150–400)
RBC: 5.12 MIL/uL — ABNORMAL HIGH (ref 3.87–5.11)
RBC: 4.73 MIL/uL (ref 3.87–5.11)
RDW: 14.3 % (ref 11.5–15.5)
RDW: 14.3 % (ref 11.5–15.5)
WBC: 7.7 10*3/uL (ref 4.0–10.5)
WBC: 7.8 10*3/uL (ref 4.0–10.5)

## 2014-02-27 LAB — TSH: TSH: 1.79 u[IU]/mL (ref 0.350–4.500)

## 2014-02-27 LAB — I-STAT TROPONIN, ED: Troponin i, poc: 0.01 ng/mL (ref 0.00–0.08)

## 2014-02-27 LAB — LIPASE, BLOOD: LIPASE: 76 U/L — AB (ref 11–59)

## 2014-02-27 MED ORDER — ALBUTEROL SULFATE HFA 108 (90 BASE) MCG/ACT IN AERS: 1.0000 | INHALATION_SPRAY | Freq: Four times a day (QID) | RESPIRATORY_TRACT | Status: DC | PRN

## 2014-02-27 MED ORDER — ADULT MULTIVITAMIN W/MINERALS CH
1.0000 | ORAL_TABLET | Freq: Every day | ORAL | Status: DC
  Administered 2014-02-27 – 2014-02-28 (×2): 1 via ORAL
  Filled 2014-02-27 (×3): qty 1

## 2014-02-27 MED ORDER — ATORVASTATIN CALCIUM 10 MG PO TABS
10.0000 mg | ORAL_TABLET | ORAL | Status: DC
  Filled 2014-02-27: qty 1

## 2014-02-27 MED ORDER — ALBUTEROL SULFATE (2.5 MG/3ML) 0.083% IN NEBU: 2.5000 mg | INHALATION_SOLUTION | Freq: Four times a day (QID) | RESPIRATORY_TRACT | Status: DC | PRN

## 2014-02-27 MED ORDER — ALPRAZOLAM 0.5 MG PO TABS: 0.5000 mg | ORAL_TABLET | Freq: Every evening | ORAL | Status: DC | PRN

## 2014-02-27 MED ORDER — FLUTICASONE PROPIONATE HFA 44 MCG/ACT IN AERO
1.0000 | INHALATION_SPRAY | Freq: Two times a day (BID) | RESPIRATORY_TRACT | Status: DC
  Filled 2014-02-27: qty 10.6

## 2014-02-27 MED ORDER — MULTI-VITAMIN/MINERALS PO TABS: 1.0000 | ORAL_TABLET | Freq: Every day | ORAL | Status: DC

## 2014-02-27 MED ORDER — ASPIRIN EC 325 MG PO TBEC
325.0000 mg | DELAYED_RELEASE_TABLET | Freq: Every day | ORAL | Status: DC
  Administered 2014-02-27 – 2014-02-28 (×2): 325 mg via ORAL
  Filled 2014-02-27 (×2): qty 1

## 2014-02-27 MED ORDER — PANTOPRAZOLE SODIUM 40 MG PO TBEC
40.0000 mg | DELAYED_RELEASE_TABLET | Freq: Every day | ORAL | Status: DC
  Administered 2014-02-28: 40 mg via ORAL
  Filled 2014-02-27: qty 1

## 2014-02-27 MED ORDER — HYOSCYAMINE SULFATE 0.125 MG PO TABS
0.1250 mg | ORAL_TABLET | Freq: Four times a day (QID) | ORAL | Status: DC | PRN
  Filled 2014-02-27: qty 1

## 2014-02-27 MED ORDER — NITROGLYCERIN 0.4 MG SL SUBL
0.4000 mg | SUBLINGUAL_TABLET | SUBLINGUAL | Status: DC | PRN
  Administered 2014-02-27: 0.4 mg via SUBLINGUAL
  Filled 2014-02-27: qty 1

## 2014-02-27 MED ORDER — GI COCKTAIL ~~LOC~~: 30.0000 mL | Freq: Four times a day (QID) | ORAL | Status: DC | PRN

## 2014-02-27 MED ORDER — ASPIRIN 81 MG PO CHEW
324.0000 mg | CHEWABLE_TABLET | Freq: Once | ORAL | Status: AC
  Administered 2014-02-27: 324 mg via ORAL
  Filled 2014-02-27: qty 4

## 2014-02-27 MED ORDER — MORPHINE SULFATE 2 MG/ML IJ SOLN: 2.0000 mg | INTRAMUSCULAR | Status: DC | PRN

## 2014-02-27 MED ORDER — HEPARIN SODIUM (PORCINE) 5000 UNIT/ML IJ SOLN
5000.0000 [IU] | Freq: Three times a day (TID) | INTRAMUSCULAR | Status: DC
  Filled 2014-02-27 (×5): qty 1

## 2014-02-27 MED ORDER — LEVOTHYROXINE SODIUM 50 MCG PO TABS
50.0000 ug | ORAL_TABLET | Freq: Every day | ORAL | Status: DC
  Administered 2014-02-28: 50 ug via ORAL
  Filled 2014-02-27 (×2): qty 1

## 2014-02-27 NOTE — ED Notes (Signed)
Pt reports substernal chest pain while at rest Monday. States called her doctor who wanted her to be seen then. States pain subsided. This AM with substernal chest pain that woke her up at approx 5am. Pt denies hx of heart problems. Pt awake, alert, NAd at present, skin warm and dry. Took 81mg  ASA today.

## 2014-02-27 NOTE — ED Notes (Signed)
CP 3/10

## 2014-02-27 NOTE — ED Notes (Signed)
Food tray ordered for patient.

## 2014-02-27 NOTE — H&P (Signed)
[facility] History and Physical  Shannon Parsons VQQ:[id number] DOB: 1940/12/17 DOA: 02/27/2014  Referring physician: Dr. Regenia Skeeter  PCP: Alesia Richards, MD   Chief Complaint: chest pain  HPI: Shannon Parsons is a 73 y.o. female with PMH significant for HLD, hypothyroidism, Asthma/COPD, GERD, IBS, family hx with heart disease, prediabetes and pre-hypertension; came to ED due to Chest pain. Patient reports experiencing CP around 5am; pain woke her from sleep; mid chest pain, non radiated, w/o associated symptoms and lasting approx 2-3 hours before start fading away; once in the ED she received ASA and NTG and pain completely resolved. Patient reported similar episode on Monday (6/15) also at rest in the morning, and that one lasted approx 6-8 hours before completely disappearing. Patient was advised to came to ED on Monday, but since pain went away she disregard PCP recommendations. Patient denies fever, chills, SOB, cough, nausea, vomiting, HA's, blurred vision, melena, hematochezia or any other acute complaints. CXR  Demonstrated no acute cardiopulmonary process, neg initial troponin and no acute ischemic changes on EKG. TR[facility] called to admit patient for further evaluation and treatment.   Review of Systems:  Negative except as mentioned on HPI  Past Medical History  Diagnosis Date  . OSA (obstructive sleep apnea)   . Hiatal hernia   . IBS (irritable bowel syndrome)   . Fibrocystic breast disease   . Asthma   . Diverticulosis   . Adenomatous colon polyp 1994  . Internal hemorrhoids   . Melanoma     Facial  . Hyperlipidemia   . Hypertension   . GERD (gastroesophageal reflux disease)   . Osteoporosis   . Osteoarthritis   . Vitamin D deficiency   . Prediabetes   . Unspecified hypothyroidism   . Family history of ischemic heart disease    Past Surgical History  Procedure Laterality Date  . Appendectomy    . Tonsillectomy    . Bunionectomy    . Knee arthroscopy      . Umbilical hernia repair     Social History:  reports that she has never smoked. She has never used smokeless tobacco. She reports that she does not drink alcohol or use illicit drugs.  Allergies  Allergen Reactions  . Actonel [Risedronate Sodium]   . Fosamax [Alendronate]   . Sulfa Antibiotics   . Tetracyclines & Related     Family History  Problem Relation Age of Onset  . Emphysema Father   . Heart disease Father   . Heart attack Father 78    Died  . Heart disease Brother   . Rheum arthritis Sister   . Hypertension Mother   . Thyroid disease Mother   . Colon cancer Neg Hx      Prior to Admission medications   Medication Sig Start Date End Date Taking? Authorizing Provider  albuterol (PROVENTIL HFA;VENTOLIN HFA) 108 (90 BASE) MCG/ACT inhaler Inhale 1-2 puffs into the lungs every 4 (four) hours as needed for wheezing. 12/20/13 12/21/14 Yes Unk Pinto, MD  ALPRAZolam Duanne Moron) 0.5 MG tablet Take 1/2 to 1 tablet at bedtime as needed for sleep 12/20/13  Yes Unk Pinto, MD  atorvastatin (LIPITOR) 20 MG tablet Take 10 mg by mouth 2 (two) times a week.   Yes Historical Provider, MD  beclomethasone (QVAR) 80 MCG/ACT inhaler Inhale 1 puff into the lungs daily. 03/13/13  Yes Kathee Delton, MD  Cholecalciferol (VITAMIN D) 2000 UNITS CAPS Take 2 capsules by mouth daily.   Yes Historical Provider, MD  dicyclomine (BENTYL) 10 MG capsule Take 10 mg by mouth daily as needed for spasms.    Yes Historical Provider, MD  hyoscyamine (LEVSIN, ANASPAZ) 0.125 MG tablet Take 1 to 2 tablets every 4 hours as needed for nausea, cramping, bloating or diarrhea 12/20/13  Yes Unk Pinto, MD  levothyroxine (SYNTHROID, LEVOTHROID) 50 MCG tablet Take 50 mcg by mouth daily before breakfast.   Yes Historical Provider, MD  Magnesium 200 MG TABS Take 1 tablet by mouth 4 (four) times a week.    Yes Historical Provider, MD  Multiple Vitamins-Minerals (MULTIVITAMIN WITH MINERALS) tablet Take 1 tablet by mouth  daily.   Yes Historical Provider, MD  Zinc 50 MG TABS Take 25 mg by mouth daily.   Yes Historical Provider, MD  atorvastatin (LIPITOR) 20 MG tablet TAKE 1 TABLET BY MOUTH ONCE DAILY. 09/18/13 02/27/14  Vicie Mutters, PA-C   Physical Exam: Filed Vitals:   02/27/14 1458  BP: 126/56  Pulse: 55  Temp: 98.5 F (36.9 C)  Resp: 16    BP 126/56  Pulse 55  Temp(Src) 98.5 F (36.9 C) (Oral)  Resp 16  Ht 5\' 3"  (1.6 m)  Wt 62.596 kg (138 lb)  BMI 24.45 kg/m2  SpO2 100%  General:  Appears calm and comfortable; afebrile and in no acute distress Eyes: PERRL, normal lids, irises & conjunctiva, EOMI, no icterus, no nystagmus ENT: grossly normal hearing, lips & tongue, no erythema or exudates inside her mouth; no drainage out of ears or nostrils  Neck: no LAD, masses or thyromegaly, no JVD Cardiovascular: RRR, no m/r/g. No LE edema. Respiratory: CTA bilaterally, no w/r/r. Normal respiratory effort. Abdomen: soft, nt, nd and positive BS Skin: no rash or induration seen on limited exam Musculoskeletal: grossly normal tone BUE/BLE Psychiatric: grossly normal mood and affect, speech fluent and appropriate Neurologic: grossly non-focal.          Labs on Admission:  Basic Metabolic Panel:  Recent Labs Lab 02/27/14 0536  NA 141  K 3.9  CL 103  CO2 26  GLUCOSE 137*  BUN 15  CREATININE 0.84  CALCIUM 9.7   Liver Function Tests:  Recent Labs Lab 02/27/14 0536  AST 34  ALT 17  ALKPHOS 67  BILITOT 0.3  PROT 7.2  ALBUMIN 3.7    Recent Labs Lab 02/27/14 0536  LIPASE 76*   CBC:  Recent Labs Lab 02/27/14 0536  WBC 7.7  HGB 14.7  HCT 45.0  MCV 87.9  PLT 233   Radiological Exams on Admission: Dg Chest 2 View  02/27/2014   CLINICAL DATA:  Mid chest pain with history of hypertension and COPD  EXAM: CHEST  2 VIEW  COMPARISON:  PA and lateral chest of November 20, 2012 and Feb 06, 2013  FINDINGS: The lungs are hyperinflated and clear. The heart and mediastinal structures are  normal. There is no pleural effusion. There is gentle S-shaped thoracolumbar scoliosis.  IMPRESSION: C OPD.  There is no acute cardiopulmonary disease.   Electronically Signed   By: David  Martinique   On: 02/27/2014 12:12    EKG:  No acute ischemic changes; sinus rhythm   Assessment/Plan 1-Chest pain: atypical but with typical features and risk factors (HLD, age, family Hx). -will admit to telemetry, cycle troponin and EKG's -will start ASA, continue statins -no B-blocker given hx of COPD and bradycardia -lipase mildly elevation; but patient denies nausea, vomiting or abd pain.  -start tx for GERD with PPI -will check  2-D echo, TSH, lipid  profile and A1C as part of stratification -cardiology consulted and the plan is for myoview in am; will follow rec's. -currently CP free, no acute ischemic changes on EKG and neg troponin X1  2-Chronic obstructive asthma, unspecified: no wheezing and denies SOB -will continue flovent and PRN albuterol  3-Hyperlipidemia: will check lipid panel and continue statins  4-hx of Hypertension: was controlling BP just with diet prior to admission. -will continue low sodium diet and monitor BP  5-GERD: will start tx with PPI and use PRN GI cocktail  6-hx of Prediabetes: according to patient suspected due to extensive use of steroids in the past; has been just following diet. -will check A1C  7-hx of IBS: will continue tx with levsin; will also add PPI  8-insomnia/Anxiety state, unspecified: will continue PRN xanax at bedtime  DVT: heparin   Cardiology (Dr. Irish Lack)  Code Status: Full Family Communication: no family at bedside Disposition Plan: observation, telemetry; LOS < 2 midnights  Time spent: 2 minutes  Barton Dubois [facility] Pager [phone]  **Disclaimer: This note may have been dictated with voice recognition software. Similar sounding words can inadvertently be transcribed and this note may contain transcription errors which  may not have been corrected upon publication of note.**

## 2014-02-27 NOTE — ED Provider Notes (Signed)
CSN: [id number]     Arrival date & time 02/27/14  2025 History   First MD Initiated Contact with Patient 02/27/14 1104     Chief Complaint  Patient presents with  . Chest Pain     (Consider location/radiation/quality/duration/timing/severity/associated sxs/prior Treatment) HPI 73 year old female presents with intermittent chest pain. 2 days ago she had 5 hours of substernal chest heaviness. She states she was not really short of breath did not have nausea or diaphoresis. She felt like the pain was going to her back. This spontaneously resolved. She caught her PCP recommended going to the ER but she declined. Yesterday she had no pain but then this morning at 5 AM she woke up with similar pain as 8/10. This progressively improved and is now about a 1/10. She took a baby aspirin before arrival. Has history of hyperlipidemia and extensive family history of coronary disease. Denies smoking or hypertension. No diabetes. Pain does not seem worse with exertion. No leg swelling or pleuritic symptoms. She feels like the pain is better in her chest but feels like it might also be now in her epigastric abdominal area.  Past Medical History  Diagnosis Date  . OSA (obstructive sleep apnea)   . Hiatal hernia   . IBS (irritable bowel syndrome)   . Fibrocystic breast disease   . Asthma   . Diverticulosis   . Adenomatous colon polyp 1994  . Internal hemorrhoids   . Melanoma     Facial  . Hyperlipidemia   . Hypertension   . GERD (gastroesophageal reflux disease)   . Osteoporosis   . Osteoarthritis   . Vitamin D deficiency   . Prediabetes   . Unspecified hypothyroidism    Past Surgical History  Procedure Laterality Date  . Appendectomy    . Tonsillectomy    . Bunionectomy    . Knee arthroscopy    . Umbilical hernia repair     Family History  Problem Relation Age of Onset  . Emphysema Father   . Heart disease Father   . Heart attack Father 58    Died  . Heart disease Brother   . Rheum  arthritis Sister   . Hypertension Mother   . Thyroid disease Mother   . Colon cancer Neg Hx    History  Substance Use Topics  . Smoking status: Never Smoker   . Smokeless tobacco: Never Used  . Alcohol Use: No   OB History   Grav Para Term Preterm Abortions TAB SAB Ect Mult Living                 Review of Systems  Constitutional: Negative for fever.  Respiratory: Negative for shortness of breath.   Cardiovascular: Positive for chest pain. Negative for leg swelling.  Gastrointestinal: Positive for abdominal pain. Negative for nausea and vomiting.  Musculoskeletal: Positive for back pain.  All other systems reviewed and are negative.     Allergies  Actonel; Fosamax; Sulfa antibiotics; and Tetracyclines & related  Home Medications   Prior to Admission medications   Medication Sig Start Date End Date Taking? Authorizing Provider  albuterol (PROVENTIL HFA;VENTOLIN HFA) 108 (90 BASE) MCG/ACT inhaler Inhale 1-2 puffs into the lungs every 4 (four) hours as needed for wheezing. 12/20/13 12/21/14  Unk Pinto, MD  ALPRAZolam Duanne Moron) 0.5 MG tablet Take 1/2 to 1 tablet at bedtime as needed for sleep 12/20/13   Unk Pinto, MD  atorvastatin (LIPITOR) 20 MG tablet TAKE 1 TABLET BY MOUTH ONCE DAILY. 09/18/13  Vicie Mutters, PA-C  azithromycin (ZITHROMAX) 250 MG tablet Take 2 tablets (500 mg) on  Day 1,  followed by 1 tablet (250 mg) once daily on Days 2 through 5. 01/15/14   Unk Pinto, MD  beclomethasone (QVAR) 80 MCG/ACT inhaler Inhale 2 puffs into the lungs 2 (two) times daily. 03/13/13   Kathee Delton, MD  Cholecalciferol (VITAMIN D) 2000 UNITS CAPS Take 2 capsules by mouth daily.    Historical Provider, MD  dicyclomine (BENTYL) 10 MG capsule Take 10 mg by mouth as needed.     Historical Provider, MD  hyoscyamine (LEVSIN, ANASPAZ) 0.125 MG tablet Take 1 to 2 tablets every 4 hours as needed for nausea, cramping, bloating or diarrhea 12/20/13   Unk Pinto, MD  levothyroxine  (SYNTHROID, LEVOTHROID) 50 MCG tablet TAKE 1 TABLET BY MOUTH ONCE DAILY. 01/11/14   Vicie Mutters, PA-C  Magnesium 200 MG TABS Take 1 tablet by mouth 4 (four) times a week.     Historical Provider, MD  predniSONE (DELTASONE) 20 MG tablet Take 1 tablet (20 mg total) by mouth See admin instructions. 1 tab 3 x day for 3 days, then 1 tab 2 x day for 3 days, then 1 tab 1 x day for 5 days 01/15/14   Unk Pinto, MD   BP 126/76  Pulse 64  Temp(Src) 97.8 F (36.6 C) (Oral)  Resp 13  Wt 138 lb 1.6 oz (62.642 kg)  SpO2 100% Physical Exam  Nursing note and vitals reviewed. Constitutional: She is oriented to person, place, and time. She appears well-developed and well-nourished. No distress.  HENT:  Head: Normocephalic and atraumatic.  Right Ear: External ear normal.  Left Ear: External ear normal.  Nose: Nose normal.  Eyes: Right eye exhibits no discharge. Left eye exhibits no discharge.  Cardiovascular: Normal rate, regular rhythm and normal heart sounds.   Pulmonary/Chest: Effort normal and breath sounds normal. She exhibits no tenderness.  Abdominal: Soft. She exhibits no distension. There is no tenderness.  Musculoskeletal: She exhibits no edema and no tenderness.  Neurological: She is alert and oriented to person, place, and time.  Skin: Skin is warm and dry.    ED Course  Procedures (including critical care time) Labs Review Labs Reviewed  CBC - Abnormal; Notable for the following:    RBC 5.12 (*)    All other components within normal limits  BASIC METABOLIC PANEL - Abnormal; Notable for the following:    Glucose, Bld 137 (*)    GFR calc non Af Amer 69 (*)    GFR calc Af Amer 80 (*)    All other components within normal limits  HEPATIC FUNCTION PANEL  LIPASE, BLOOD  I-STAT TROPOININ, ED    Imaging Review Dg Chest 2 View  02/27/2014   CLINICAL DATA:  Mid chest pain with history of hypertension and COPD  EXAM: CHEST  2 VIEW  COMPARISON:  PA and lateral chest of November 20, 2012  and Feb 06, 2013  FINDINGS: The lungs are hyperinflated and clear. The heart and mediastinal structures are normal. There is no pleural effusion. There is gentle S-shaped thoracolumbar scoliosis.  IMPRESSION: C OPD.  There is no acute cardiopulmonary disease.   Electronically Signed   By: David  Martinique   On: 02/27/2014 12:12     EKG Interpretation   Date/Time:  Wednesday February 27 2014 09:29:07 EDT Ventricular Rate:  88 PR Interval:  146 QRS Duration: 80 QT Interval:  350 QTC Calculation: 423 R Axis:  70 Text Interpretation:  Normal sinus rhythm Right atrial enlargement  Borderline ECG No old tracing to compare Confirmed by GOLDSTON  MD, SCOTT  ([id number]) on 02/27/2014 11:10:27 AM      MDM   Final diagnoses:  Chest pain    Patient has very mild lipase elevation at 76 but this does not seem consistent with her chest pain history. She has no abdominal tenderness his for this pancreatitis. Given her symptoms woke her up and have been occurring for multiple days and concern for a cardiac source. Could also be atypical GI pathology. We'll get to the hospitalist for serial troponins and ACS rule out. She does endorse some back pain but her aorta is normal caliber on chest x-ray and her pain resolved with one nitroglycerin, have low suspicion for dissection.    Ephraim Hamburger, MD 02/27/14 [phone]

## 2014-02-27 NOTE — ED Notes (Signed)
Pt states that she feels okay/better now.

## 2014-02-27 NOTE — ED Notes (Signed)
Pt states that she is having some pains in her chest right now. Pain level is 3

## 2014-02-27 NOTE — Consult Note (Signed)
CARDIOLOGY CONSULT NOTE      Patient ID: Shannon Parsons MRN: [id number] DOB/AGE: May 23, 1941 73 y.o.  Admit date: 02/27/2014 Referring Physician  Dr. Dyann Kief Primary Physician Dr. Melford Aase Primary Cardiologist Yong Channel Reason for Consultation chest pain  HPI: 73 year old woman with a history of hyperlipidemia and hypothyroidism. On Monday, she had an episode of substernal chest discomfort. It started about 5 AM. It lasted about 10 hours. It was a pressure in the Center of her chest. There was no associated nausea, vomiting, diaphoresis or radiation of the pain. It went away spontaneously. She did call her primary care physician recommended that she go the emergency room. She did not go.  Earlier this morning, she had a similar episode that woke her up from sleep. It lasted several hours. She then decided to come to the emergency room. She received nitroglycerin without any change in the pain. She had a negative EKG and negative enzymes. Currently, she is pain-free.  Review of systems complete and found to be negative unless listed above   Past Medical History  Diagnosis Date  . OSA (obstructive sleep apnea)   . Hiatal hernia   . IBS (irritable bowel syndrome)   . Fibrocystic breast disease   . Asthma   . Diverticulosis   . Adenomatous colon polyp 1994  . Internal hemorrhoids   . Melanoma     Facial  . Hyperlipidemia   . Hypertension   . GERD (gastroesophageal reflux disease)   . Osteoporosis   . Osteoarthritis   . Vitamin D deficiency   . Prediabetes   . Unspecified hypothyroidism     Family History  Problem Relation Age of Onset  . Emphysema Father   . Heart disease Father   . Heart attack Father 70    Died  . Heart disease Brother   . Rheum arthritis Sister   . Hypertension Mother   . Thyroid disease Mother   . Colon cancer Neg Hx     History   Social History  . Marital Status: Married    Spouse Name: N/A    Number of Children: N/A  . Years of Education:  N/A   Occupational History  . retired from office work    Social History Main Topics  . Smoking status: Never Smoker   . Smokeless tobacco: Never Used  . Alcohol Use: No  . Drug Use: No  . Sexual Activity: Not on file   Other Topics Concern  . Not on file   Social History Narrative  . No narrative on file    Past Surgical History  Procedure Laterality Date  . Appendectomy    . Tonsillectomy    . Bunionectomy    . Knee arthroscopy    . Umbilical hernia repair       Prescriptions prior to admission  Medication Sig Dispense Refill  . albuterol (PROVENTIL HFA;VENTOLIN HFA) 108 (90 BASE) MCG/ACT inhaler Inhale 1-2 puffs into the lungs every 4 (four) hours as needed for wheezing.  1 Inhaler  99  . ALPRAZolam (XANAX) 0.5 MG tablet Take 1/2 to 1 tablet at bedtime as needed for sleep  30 tablet  1  . atorvastatin (LIPITOR) 20 MG tablet Take 10 mg by mouth 2 (two) times a week.      . beclomethasone (QVAR) 80 MCG/ACT inhaler Inhale 1 puff into the lungs daily.      . Cholecalciferol (VITAMIN D) 2000 UNITS CAPS Take 2 capsules by mouth daily.      [name]  dicyclomine (BENTYL) 10 MG capsule Take 10 mg by mouth daily as needed for spasms.       . hyoscyamine (LEVSIN, ANASPAZ) 0.125 MG tablet Take 1 to 2 tablets every 4 hours as needed for nausea, cramping, bloating or diarrhea  30 tablet  1  . levothyroxine (SYNTHROID, LEVOTHROID) 50 MCG tablet Take 50 mcg by mouth daily before breakfast.      . Magnesium 200 MG TABS Take 1 tablet by mouth 4 (four) times a week.       . Multiple Vitamins-Minerals (MULTIVITAMIN WITH MINERALS) tablet Take 1 tablet by mouth daily.      . Zinc 50 MG TABS Take 25 mg by mouth daily.      [name] atorvastatin (LIPITOR) 20 MG tablet TAKE 1 TABLET BY MOUTH ONCE DAILY.        Physical Exam: Vitals:   Filed Vitals:   02/27/14 1331 02/27/14 1406 02/27/14 1430 02/27/14 1458  BP: 108/58 102/56 115/56 126/56  Pulse: 57 56 68 55  Temp:    98.5 F (36.9 C)  TempSrc:    Oral   Resp: 12 18 16 16   Height:    5\' 3"  (1.6 m)  Weight:    138 lb (62.596 kg)  SpO2: 99% 99% 100% 100%   I&O's:  No intake or output data in the 24 hours ending 02/27/14 1556 Physical exam:  [place]/AT EOMI No JVD, No carotid bruit RRR S1S2  No wheezing Soft. NT, nondistended No edema. No focal motor or sensory deficits; resting tremor Normal affect  Labs:   Lab Results  Component Value Date   WBC 7.7 02/27/2014   HGB 14.7 02/27/2014   HCT 45.0 02/27/2014   MCV 87.9 02/27/2014   PLT 233 02/27/2014    Recent Labs Lab 02/27/14 0536  NA 141  K 3.9  CL 103  CO2 26  BUN 15  CREATININE 0.84  CALCIUM 9.7  PROT 7.2  BILITOT 0.3  ALKPHOS 67  ALT 17  AST 34  GLUCOSE 137*   No results found for this basename: CKTOTAL, CKMB, CKMBINDEX, TROPONINI    Lab Results  Component Value Date   CHOL 174 12/20/2013   Lab Results  Component Value Date   HDL 55 12/20/2013   Lab Results  Component Value Date   LDLCALC 104* 12/20/2013   Lab Results  Component Value Date   TRIG 75 12/20/2013   Lab Results  Component Value Date   CHOLHDL 3.2 12/20/2013   No results found for this basename: LDLDIRECT      Radiology: Evidence of COPD EKG normal  ASSESSMENT AND PLAN: Chest pain, some features concerning for unstable angina. We discussed both cardiac cath versus stress testing for ischemia evaluation. The fact that her symptoms came on with rest and are typical in character of pain, is concerning that this is cardiac.  However, she does walk daily 3045 minutes and has no similar symptoms. She walks up stairs without any difficulty. She would prefer to avoid a cardiac cath. She does have a significant family history of heart disease. Her father had heart disease in his 71s. She has a brother who had heart disease diagnosed in his 31s. We'll plan for a nuclear stress test tomorrow. We'll make her n.p.o. after midnight.  Hyperlipidemia: Continue atorvastatin. LDL is well-controlled at last check in  April.  Signed:   Mina Marble, MD, [facility] 02/27/2014, 3:56 PM

## 2014-02-27 NOTE — ED Notes (Signed)
Attempted report 

## 2014-02-28 ENCOUNTER — Observation Stay (HOSPITAL_COMMUNITY): Payer: Medicare Other

## 2014-02-28 ENCOUNTER — Encounter (HOSPITAL_COMMUNITY): Payer: Self-pay | Admitting: General Practice

## 2014-02-28 DIAGNOSIS — E785 Hyperlipidemia, unspecified: Secondary | ICD-10-CM

## 2014-02-28 DIAGNOSIS — I10 Essential (primary) hypertension: Secondary | ICD-10-CM

## 2014-02-28 DIAGNOSIS — R079 Chest pain, unspecified: Secondary | ICD-10-CM

## 2014-02-28 DIAGNOSIS — I517 Cardiomegaly: Secondary | ICD-10-CM

## 2014-02-28 LAB — LIPID PANEL
Cholesterol: 176 mg/dL (ref 0–200)
HDL: 49 mg/dL (ref >39)
LDL Cholesterol: 109 mg/dL — ABNORMAL HIGH (ref 0–99)
TRIGLYCERIDES: 92 mg/dL (ref <150)
Total CHOL/HDL Ratio: 3.6 RATIO
VLDL: 18 mg/dL (ref 0–40)

## 2014-02-28 LAB — TROPONIN I: Troponin I: <0.3 ng/mL (ref <0.30)

## 2014-02-28 LAB — HEMOGLOBIN A1C
Hgb A1c MFr Bld: 5.8 % — ABNORMAL HIGH (ref <5.7)
Mean Plasma Glucose: 120 mg/dL — ABNORMAL HIGH (ref <117)

## 2014-02-28 MED ORDER — TECHNETIUM TC 99M SESTAMIBI GENERIC - CARDIOLITE
10.0000 | Freq: Once | INTRAVENOUS | Status: AC | PRN
  Administered 2014-02-28: 10 via INTRAVENOUS

## 2014-02-28 MED ORDER — PANTOPRAZOLE SODIUM 40 MG PO TBEC: 40.0000 mg | DELAYED_RELEASE_TABLET | Freq: Every day | ORAL | Status: DC

## 2014-02-28 MED ORDER — REGADENOSON 0.4 MG/5ML IV SOLN
INTRAVENOUS | Status: AC
  Filled 2014-02-28: qty 5

## 2014-02-28 MED ORDER — TECHNETIUM TC 99M SESTAMIBI GENERIC - CARDIOLITE
30.0000 | Freq: Once | INTRAVENOUS | Status: AC | PRN
  Administered 2014-02-28: 30 via INTRAVENOUS

## 2014-02-28 NOTE — Discharge Summary (Signed)
Physician Discharge Summary  NOVIS LEAGUE DGU:[id number] DOB: [birth year] DOA: 02/27/2014  PCP: Alesia Richards, MD  Admit date: 02/27/2014 Discharge date: 02/28/2014  Recommendations for Outpatient Follow-up:  1. F/u with cardiology as needed if chest pain continues 2. Followup with primary care doctor in one to 2 weeks for ongoing management of high blood pressure prediabetes  Discharge Diagnoses:  Principal Problem:   Chest pain, noncardiac, and possibly due to GERD Active Problems:   Chronic obstructive asthma, unspecified   Hyperlipidemia   Hypertension   GERD   Prediabetes   IBS   Family history of ischemic heart disease   Anxiety state, unspecified   Discharge Condition: Stable, improved  Diet recommendation: Healthy heart  Wt Readings from Last 3 Encounters:  02/27/14 62.596 kg (138 lb)  01/15/14 63.957 kg (141 lb)  01/14/14 64.683 kg (142 lb 9.6 oz)    History of present illness:  Shannon Parsons is a 73 y.o. female with PMH significant for HLD, hypothyroidism, Asthma/COPD, GERD, IBS, family hx with heart disease, prediabetes and pre-hypertension; came to ED due to Chest pain. Patient reports experiencing CP around 5am; pain woke her from sleep; mid chest pain, non radiated, w/o associated symptoms and lasting approx 2-3 hours before start fading away; once in the ED she received ASA and NTG and pain completely resolved. Patient reported similar episode on Monday (6/15) also at rest in the morning, and that one lasted approx 6-8 hours before completely disappearing. Patient was advised to came to ED on Monday, but since pain went away she disregard PCP recommendations. Patient denies fever, chills, SOB, cough, nausea, vomiting, HA's, blurred vision, melena, hematochezia or any other acute complaints.   CXR Demonstrated no acute cardiopulmonary process, neg initial troponin and no acute ischemic changes on EKG. TR[facility] called to admit patient for further evaluation and  treatment.  Hospital Course:   Chest pain, possibly due to acid reflux. Pain was atypical but with typical features and risk factors (HLD, age, family Hx).  Telemetry demonstrated normal sinus rhythm with PVCs. She was occasionally bradycardic to the 50s. Her troponins were negative. She did not have any EKG changes concerning for ACS. She was started on aspirin and continue her statin. She was not on a beta blocker because of severe COPD and known history of bradycardia. She underwent a nuclear medicine stress test on 6/18 which demonstrated no obvious areas of reversible ischemia.  Echocardiogram normal left ventricle with mild LVH, ejection fraction of 60% and normal wall motion. There are no regional wall motion abnormalities. Aortic valve sclerosis without stenosis and no significant regurgitation. Right ventricle is normal.  TSH was wnl.  HDL 49 and LDL 109.  A1c 5.8.  She was advised to start protonix or omeprazole and follow up with her primary care doctor.  If she notices pain in her chest or left jaw or arm with exertion, she should follow up with cardiology.    Chronic obstructive asthma, unspecified: no wheezing and denies SOB.  Continue flovent and PRN albuterol.  Hyperlipidemia, stable. Continue statin.  No history of CAD or stroke.    Hypertension: was controlling BP just with diet prior to admission.    GERD: started PPI  Prediabetes: according to patient suspected due to extensive use of steroids in the past; has been just following diet.  A1c 5.8.  Continue diet control.    IBS: will continue tx with levsin; added PPI   Insomnia/Anxiety state, unspecified: will continue PRN xanax at bedtime  Procedures:  Chest x-ray  Echocardiogram  Nuclear medicine stress test  Consultations:  Cardiology, Dr. Irish Lack  Discharge Exam: Filed Vitals:   02/28/14 1206  BP: 108/42  Pulse: 86  Temp: 97.8 F (36.6 C)  Resp: 18   Filed Vitals:   02/28/14 1045 02/28/14 1048  02/28/14 1050 02/28/14 1206  BP: 162/40 149/60 127/70 108/42  Pulse: 148 134 90 86  Temp:    97.8 F (36.6 C)  TempSrc:    Oral  Resp:    18  Height:      Weight:      SpO2:    99%    General: Thin F, NAD HEENT:  NCAT, MMM Cardiovascular: RRR, no mrg, 2+ pulses Respiratory: CTAB, no increased WOB ABD:  NABS, soft, ND/NT MSK:  No LEE, normal tone and bulk  Discharge Instructions      Discharge Instructions   Call MD for:  difficulty breathing, headache or visual disturbances    Complete by:  As directed      Call MD for:  extreme fatigue    Complete by:  As directed      Call MD for:  hives    Complete by:  As directed      Call MD for:  persistant dizziness or light-headedness    Complete by:  As directed      Call MD for:  persistant nausea and vomiting    Complete by:  As directed      Call MD for:  severe uncontrolled pain    Complete by:  As directed      Call MD for:  temperature >100.4    Complete by:  As directed      Diet - low sodium heart healthy    Complete by:  As directed      Discharge instructions    Complete by:  As directed   You were hospitalized with chest pain.  You did not have a heart attack.  Your heart muscle is strong and your stress test was negative/normal.  Try taking omeprazole 20mg  daily or protonix 40mg  daily (also available over the counter) for the next month to see if that helps.  If you start having chest pressure or pain or pain in the left arm or jaw when walking or exerting yourself, please follow up with cardiology.  If you have severe chest pain with shortness of breath, lightheadedness, sweating, or nausea, please call 911 immediately.     Increase activity slowly    Complete by:  As directed             Medication List         albuterol 108 (90 BASE) MCG/ACT inhaler  Commonly known as:  PROVENTIL HFA;VENTOLIN HFA  Inhale 1-2 puffs into the lungs every 4 (four) hours as needed for wheezing.     ALPRAZolam 0.5 MG tablet   Commonly known as:  XANAX  Take 1/2 to 1 tablet at bedtime as needed for sleep     atorvastatin 20 MG tablet  Commonly known as:  LIPITOR  Take 10 mg by mouth 2 (two) times a week.     beclomethasone 80 MCG/ACT inhaler  Commonly known as:  QVAR  Inhale 1 puff into the lungs daily.     dicyclomine 10 MG capsule  Commonly known as:  BENTYL  Take 10 mg by mouth daily as needed for spasms.     hyoscyamine 0.125 MG tablet  Commonly known as:  LEVSIN, ANASPAZ  Take 1 to 2 tablets every 4 hours as needed for nausea, cramping, bloating or diarrhea     levothyroxine 50 MCG tablet  Commonly known as:  SYNTHROID, LEVOTHROID  Take 50 mcg by mouth daily before breakfast.     Magnesium 200 MG Tabs  Take 1 tablet by mouth 4 (four) times a week.     multivitamin with minerals tablet  Take 1 tablet by mouth daily.     pantoprazole 40 MG tablet  Commonly known as:  PROTONIX  Take 1 tablet (40 mg total) by mouth daily at 12 noon.     Vitamin D 2000 UNITS Caps  Take 2 capsules by mouth daily.     Zinc 50 MG Tabs  Take 25 mg by mouth daily.       Follow-up Information   Follow up with MCKEOWN,WILLIAM DAVID, MD. Schedule an appointment as soon as possible for a visit in 1 week.   Specialty:  Internal Medicine   Contact information:   [address] [place] [address] [phone]        The results of significant diagnostics from this hospitalization (including imaging, microbiology, ancillary and laboratory) are listed below for reference.    Significant Diagnostic Studies: Dg Chest 2 View  02/27/2014   CLINICAL DATA:  Mid chest pain with history of hypertension and COPD  EXAM: CHEST  2 VIEW  COMPARISON:  PA and lateral chest of November 20, 2012 and Feb 06, 2013  FINDINGS: The lungs are hyperinflated and clear. The heart and mediastinal structures are normal. There is no pleural effusion. There is gentle S-shaped thoracolumbar scoliosis.  IMPRESSION: C OPD.   There is no acute cardiopulmonary disease.   Electronically Signed   By: David  Martinique   On: 02/27/2014 12:12    Microbiology: No results found for this or any previous visit (from the past 240 hour(s)).   Labs: Basic Metabolic Panel:  Recent Labs Lab 02/27/14 0536 02/27/14 1845  NA 141  --   K 3.9  --   CL 103  --   CO2 26  --   GLUCOSE 137*  --   BUN 15  --   CREATININE 0.84 0.90  CALCIUM 9.7  --    Liver Function Tests:  Recent Labs Lab 02/27/14 0536  AST 34  ALT 17  ALKPHOS 67  BILITOT 0.3  PROT 7.2  ALBUMIN 3.7    Recent Labs Lab 02/27/14 0536  LIPASE 76*   No results found for this basename: AMMONIA,  in the last 168 hours CBC:  Recent Labs Lab 02/27/14 0536 02/27/14 1845  WBC 7.7 7.8  HGB 14.7 13.4  HCT 45.0 40.9  MCV 87.9 86.5  PLT 233 240   Cardiac Enzymes:  Recent Labs Lab 02/28/14 0026 02/28/14 0712  TROPONINI <0.30 <0.30   BNP: BNP (last 3 results) No results found for this basename: PROBNP,  in the last 8760 hours CBG: No results found for this basename: GLUCAP,  in the last 168 hours  Time coordinating discharge: 45 minutes  Signed:  Sharmain Lastra  [facility] 02/28/2014, 5:57 PM

## 2014-02-28 NOTE — Progress Notes (Signed)
Echocardiogram 2D Echocardiogram has been performed.  Shannon Parsons 02/28/2014, 2:05 PM

## 2014-02-28 NOTE — Progress Notes (Signed)
Patient ID: Shannon Parsons, female   DOB: 06/19/1941, 73 y.o.   MRN: [id number]  I have formally reviewed the nuclear stress test done today. At this point I cannot access the dictation system to get the formal dictated report in the record. However this study shows no significant abnormality. The raw data reveals no significant motion. Tomographic images reveal that the ventricular size is very small. This makes the interpretation difficult. No definite or abnormalities are seen visually. The quantitative analysis question some scattered areas of decreased uptake, but I feel this is unreliable in this setting. The computed ejection fraction is 87%. This means that wall motion is normal. The ejection fraction is not this high. When the ventricle is small, the computer analysis overestimates the ejection fraction.  The study is technically difficult to read. However, concerning all the data, there is no definite abnormality.  Daryel November, MD

## 2014-02-28 NOTE — Progress Notes (Signed)
SUBJECTIVE:  No further CP  OBJECTIVE:   Vitals:   Filed Vitals:   02/27/14 1430 02/27/14 1458 02/28/14 0036 02/28/14 0529  BP: 115/56 126/56 112/61 116/63  Pulse: 68 55 61 58  Temp:  98.5 F (36.9 C) 98.3 F (36.8 C) 98.3 F (36.8 C)  TempSrc:  Oral Oral Oral  Resp: 16 16 18 18   Height:  5\' 3"  (1.6 m)    Weight:  138 lb (62.596 kg)    SpO2: 100% 100% 95% 94%   I&O's:   Intake/Output Summary (Last 24 hours) at 02/28/14 7353 Last data filed at 02/28/14 0529  Gross per 24 hour  Intake    480 ml  Output    700 ml  Net   -220 ml   TELEMETRY: Reviewed telemetry pt in NSR:     PHYSICAL EXAM General: Well developed, well nourished, in no acute distress Head: Eyes PERRLA, No xanthomas.   Normal cephalic and atramatic  Lungs:   Clear bilaterally to auscultation and percussion. Heart:   HRRR S1 S2 Pulses are 2+ & equal. Abdomen: Bowel sounds are positive, abdomen soft and non-tender without masses  Extremities:   No clubbing, cyanosis or edema.  DP +1 Neuro: Alert and oriented X 3. Psych:  Good affect, responds appropriately   LABS: Basic Metabolic Panel:  Recent Labs  02/27/14 0536 02/27/14 1845  NA 141  --   K 3.9  --   CL 103  --   CO2 26  --   GLUCOSE 137*  --   BUN 15  --   CREATININE 0.84 0.90  CALCIUM 9.7  --    Liver Function Tests:  Recent Labs  02/27/14 0536  AST 34  ALT 17  ALKPHOS 67  BILITOT 0.3  PROT 7.2  ALBUMIN 3.7    Recent Labs  02/27/14 0536  LIPASE 76*   CBC:  Recent Labs  02/27/14 0536 02/27/14 1845  WBC 7.7 7.8  HGB 14.7 13.4  HCT 45.0 40.9  MCV 87.9 86.5  PLT 233 240   Cardiac Enzymes:  Recent Labs  02/28/14 0026 02/28/14 0712  TROPONINI <0.30 <0.30   BNP: No components found with this basename: POCBNP,  D-Dimer: No results found for this basename: DDIMER,  in the last 72 hours Hemoglobin A1C:  Recent Labs  02/27/14 1845  HGBA1C 5.8*   Fasting Lipid Panel:  Recent Labs  02/28/14 0026  CHOL  176  HDL 49  LDLCALC 109*  TRIG 92  CHOLHDL 3.6   Thyroid Function Tests:  Recent Labs  02/27/14 1845  TSH 1.790   Anemia Panel: No results found for this basename: VITAMINB12, FOLATE, FERRITIN, TIBC, IRON, RETICCTPCT,  in the last 72 hours Coag Panel:   No results found for this basename: INR, PROTIME    RADIOLOGY: Dg Chest 2 View  02/27/2014   CLINICAL DATA:  Mid chest pain with history of hypertension and COPD  EXAM: CHEST  2 VIEW  COMPARISON:  PA and lateral chest of November 20, 2012 and Feb 06, 2013  FINDINGS: The lungs are hyperinflated and clear. The heart and mediastinal structures are normal. There is no pleural effusion. There is gentle S-shaped thoracolumbar scoliosis.  IMPRESSION: C OPD.  There is no acute cardiopulmonary disease.   Electronically Signed   By: David  [place]   On: 02/27/2014 12:12      ASSESSMENT:  1.  Chest pain with normal cardiac enzymes and EKG.  CRF include family history of CAD,  HTN and dyslipidemia. 2.  HTN 3.  Dyslipidemia  PLAN:   Stress myoview this am to rule out ischemia  Sueanne Margarita, MD  02/28/2014  8:21 AM

## 2014-02-28 NOTE — Progress Notes (Signed)
Start of treadmill stress

## 2014-02-28 NOTE — Progress Notes (Signed)
GXT CL performed.

## 2014-03-21 ENCOUNTER — Encounter: Payer: Self-pay | Admitting: Emergency Medicine

## 2014-03-21 ENCOUNTER — Ambulatory Visit (INDEPENDENT_AMBULATORY_CARE_PROVIDER_SITE_OTHER): Payer: Medicare Other | Admitting: Emergency Medicine

## 2014-03-21 VITALS — BP 118/80 | HR 70 | Temp 98.0°F | Resp 16 | Ht 63.5 in | Wt 138.0 lb

## 2014-03-21 DIAGNOSIS — M546 Pain in thoracic spine: Secondary | ICD-10-CM

## 2014-03-21 DIAGNOSIS — K219 Gastro-esophageal reflux disease without esophagitis: Secondary | ICD-10-CM

## 2014-03-21 NOTE — Progress Notes (Signed)
Subjective:    Patient ID: Shannon Parsons, female    DOB: 01-07-1941, 73 y.o.   MRN: [id number]  HPI Comments: 73 yo WF presents for 3 month F/U for labile HTN, Cholesterol, Pre-Dm, D. Deficient. She is trying to eat healthy and keep active.   She had labs drawn at hospital visit for Chest pain.  She has not had any chest pain since hospital visit. She was started on Protonix but d/c due to rash after 5 days. She has never noticed reflux before or after hospital. She does have chronic hoarseness and asthma.   She notes her back pain has increased especially with standing. She was not aware of history scoliosis but Chest xray shows + scoliosis since 2005.She denies any recent injury or strain with back.  WBC             7.8   02/27/2014 HGB            13.4   02/27/2014 HCT            40.9   02/27/2014 PLT             240   02/27/2014 GLUCOSE         137   02/27/2014 CHOL            176   02/28/2014 TRIG             92   02/28/2014 HDL              49   02/28/2014 LDLCALC         109   02/28/2014 ALT              17   02/27/2014 AST              34   02/27/2014 NA              141   02/27/2014 K               3.9   02/27/2014 CL              103   02/27/2014 CREATININE     0.90   02/27/2014 BUN              15   02/27/2014 CO2              26   02/27/2014 TSH           1.790   02/27/2014 HGBA1C          5.8   02/27/2014   Hyperlipidemia      Review of Systems  HENT: Positive for voice change.   Gastrointestinal:       Reflux  Musculoskeletal: Positive for back pain.  All other systems reviewed and are negative.  BP 118/80  Pulse 70  Temp(Src) 98 F (36.7 C) (Temporal)  Resp 16  Ht 5' 3.5" (1.613 m)  Wt 138 lb (62.596 kg)  BMI 24.06 kg/m2     Objective:   Physical Exam  Nursing note and vitals reviewed. Constitutional: She is oriented to person, place, and time. She appears well-developed and well-nourished. No distress.  HENT:  Head: Normocephalic and atraumatic.  Right Ear:  External ear normal.  Left Ear: External ear normal.  Nose: Nose normal.  Mouth/Throat: Oropharynx is clear and moist.  + hoarseness  Eyes: Conjunctivae and EOM are normal.  Neck: Normal range of motion. Neck supple. No JVD  present. No thyromegaly present.  Cardiovascular: Normal rate, regular rhythm, normal heart sounds and intact distal pulses.   Pulmonary/Chest: Effort normal and breath sounds normal.  Abdominal: Soft. Bowel sounds are normal. She exhibits no distension and no mass. There is no tenderness. There is no rebound and no guarding.  Musculoskeletal: Normal range of motion. She exhibits no edema and no tenderness.  + curvature of thoracic spine  Lymphadenopathy:    She has no cervical adenopathy.  Neurological: She is alert and oriented to person, place, and time. No cranial nerve deficit.  Skin: Skin is warm and dry. No rash noted. No erythema. No pallor.  Psychiatric: She has a normal mood and affect. Her behavior is normal. Judgment and thought content normal.          Assessment & Plan:  1. ? GERD with chronic hoarseness and asthma/ COPD-REF GI  With DR Carmelia Roller diet discussed, Prilosec OTC, Check lab for hpylori. SX Prilosec 1 BID X 1 box  2. Back pain with scoliosis- REF Ortho for evaluation

## 2014-03-21 NOTE — Patient Instructions (Signed)

## 2014-03-22 ENCOUNTER — Telehealth: Payer: Self-pay | Admitting: Pulmonary Disease

## 2014-03-22 NOTE — Telephone Encounter (Signed)
ATC PT line busy wcb

## 2014-03-25 ENCOUNTER — Other Ambulatory Visit: Payer: Self-pay | Admitting: Emergency Medicine

## 2014-03-25 LAB — HELICOBACTER PYLORI ABS-IGG+IGA, BLD
H Pylori IgG: >8 {ISR} — ABNORMAL HIGH
HELICOBACTER PYLORI AB, IGA: 17.8 U/mL — AB (ref <9.0)

## 2014-03-25 MED ORDER — AMOXICILLIN 500 MG PO CAPS: 1000.0000 mg | ORAL_CAPSULE | Freq: Two times a day (BID) | ORAL | Status: DC

## 2014-03-25 MED ORDER — CLARITHROMYCIN 500 MG PO TABS: 500.0000 mg | ORAL_TABLET | Freq: Two times a day (BID) | ORAL | Status: AC

## 2014-03-25 NOTE — Telephone Encounter (Signed)
She has chronic obstructive asthma, meaning some of her airway obstruction is fixed and can never improve (similar to COPD), and some is reversible meaning that if she stays on her meds it keeps it from become permanently fixed.  That is the reason her meds are so important, to keep her from losing further lung function.

## 2014-03-25 NOTE — Telephone Encounter (Signed)
LMOM x 1 

## 2014-03-25 NOTE — Telephone Encounter (Signed)
Pt is aware of [place] response. Nothing further needed

## 2014-03-25 NOTE — Telephone Encounter (Signed)
Pt returned call

## 2014-03-25 NOTE — Telephone Encounter (Signed)
Called, spoke with pt -  Pt reports she is aware she is dx with Asthma, but believes it was mentioned at one time that she was "headed in the direction of copd" based upon test results.  She would like clarification on this -- is this correct?  KC, pls advise.  Thank you.  Last OV with [facility] 03/13/13; asked to f/u in 6 months. No pending appt. Attempted to schedule Yearly OV with [place] -- pt requesting to hold off for now.

## 2014-03-26 ENCOUNTER — Other Ambulatory Visit: Payer: Self-pay | Admitting: Pulmonary Disease

## 2014-03-26 ENCOUNTER — Encounter: Payer: Self-pay | Admitting: Gastroenterology

## 2014-04-01 ENCOUNTER — Ambulatory Visit: Payer: Medicare Other | Admitting: Dietician

## 2014-04-09 ENCOUNTER — Ambulatory Visit (INDEPENDENT_AMBULATORY_CARE_PROVIDER_SITE_OTHER): Payer: Medicare Other | Admitting: Physician Assistant

## 2014-04-09 ENCOUNTER — Encounter: Payer: Self-pay | Admitting: Physician Assistant

## 2014-04-09 ENCOUNTER — Ambulatory Visit (HOSPITAL_COMMUNITY)
Admission: RE | Admit: 2014-04-09 | Discharge: 2014-04-09 | Disposition: A | Discharge status: Home / Self Care | Payer: Medicare Other | Source: Ambulatory Visit | Attending: Physician Assistant | Admitting: Physician Assistant

## 2014-04-09 VITALS — BP 120/78 | HR 60 | Temp 97.9°F | Resp 16 | Ht 63.5 in | Wt 138.0 lb

## 2014-04-09 DIAGNOSIS — R0609 Other forms of dyspnea: Secondary | ICD-10-CM

## 2014-04-09 DIAGNOSIS — R0989 Other specified symptoms and signs involving the circulatory and respiratory systems: Secondary | ICD-10-CM

## 2014-04-09 DIAGNOSIS — J441 Chronic obstructive pulmonary disease with (acute) exacerbation: Secondary | ICD-10-CM

## 2014-04-09 DIAGNOSIS — R0600 Dyspnea, unspecified: Secondary | ICD-10-CM

## 2014-04-09 DIAGNOSIS — M546 Pain in thoracic spine: Secondary | ICD-10-CM

## 2014-04-09 DIAGNOSIS — R079 Chest pain, unspecified: Secondary | ICD-10-CM

## 2014-04-09 MED ORDER — AZITHROMYCIN 250 MG PO TABS: ORAL_TABLET | ORAL | Status: DC

## 2014-04-09 MED ORDER — PREDNISONE 20 MG PO TABS: ORAL_TABLET | ORAL | Status: DC

## 2014-04-09 NOTE — Patient Instructions (Signed)
Use NEBULIZER 2-3 times a day

## 2014-04-09 NOTE — Progress Notes (Signed)
   Subjective:    Patient ID: Shannon Parsons, female    DOB: 1940-11-11, 73 y.o.   MRN: [id number]  Cough This is a new problem. The current episode started in the past 7 days. The problem has been gradually worsening. The cough is non-productive. Associated symptoms include shortness of breath and wheezing. Pertinent negatives include no chest pain, chills, ear congestion, ear pain, fever, headaches, heartburn, hemoptysis, myalgias, nasal congestion, postnasal drip, rash, rhinorrhea, sore throat, sweats or weight loss. The symptoms are aggravated by exercise (Extreme fatigue with walking yesterday morning and felt bad all day yesterday afterwards. ). She has tried a beta-agonist inhaler (Qvar, nebulizer) for the symptoms. Her past medical history is significant for asthma.   Had normal stress test 02/2014 in the hospital, normal echo, CXR in June 2015 as well showing COPD.    Review of Systems  Constitutional: Positive for fatigue. Negative for fever, chills, weight loss, diaphoresis, activity change and appetite change.  HENT: Negative for ear pain, postnasal drip, rhinorrhea and sore throat.   Respiratory: Positive for cough, chest tightness (between shoulders ), shortness of breath and wheezing. Negative for hemoptysis.   Cardiovascular: Negative for chest pain, palpitations and leg swelling.  Gastrointestinal: Negative.  Negative for heartburn.  Genitourinary: Negative.   Musculoskeletal: Negative.  Negative for myalgias.  Skin: Negative for rash.  Neurological: Negative for headaches.       Objective:   Physical Exam  Constitutional: She appears well-developed and well-nourished.  HENT:  Head: Normocephalic and atraumatic.  Neck: Normal range of motion. Neck supple.  Cardiovascular: Normal rate and regular rhythm.   No murmur heard. Pulmonary/Chest: Effort normal. She has decreased breath sounds in the right lower field. She has wheezes. She has no rhonchi. She has no rales. She  exhibits no tenderness.  Abdominal: Soft. Bowel sounds are normal.  Lymphadenopathy:    She has no cervical adenopathy.       Assessment & Plan:  DOE, wheezing, back pain- get EKG which was normal, likely COPD exacerbation- get CXR r/o effusion, duonebs TID at home, will switch from QVAR to symbicort 160, and Zpak, Prednisone If worse go to the ER

## 2014-04-22 ENCOUNTER — Telehealth: Payer: Self-pay | Admitting: Gastroenterology

## 2014-04-22 NOTE — Telephone Encounter (Signed)
Patient is c/o chest pain, abdominal bloating, and pain radiating into her back.  She will come in and see Tye Savoy RNP on 04/23/14 1:30

## 2014-04-23 ENCOUNTER — Other Ambulatory Visit (INDEPENDENT_AMBULATORY_CARE_PROVIDER_SITE_OTHER): Payer: Medicare Other

## 2014-04-23 ENCOUNTER — Encounter: Payer: Self-pay | Admitting: Nurse Practitioner

## 2014-04-23 ENCOUNTER — Ambulatory Visit (INDEPENDENT_AMBULATORY_CARE_PROVIDER_SITE_OTHER): Payer: Medicare Other | Admitting: Nurse Practitioner

## 2014-04-23 VITALS — BP 110/50 | HR 66 | Ht 64.0 in | Wt 134.0 lb

## 2014-04-23 DIAGNOSIS — R1013 Epigastric pain: Secondary | ICD-10-CM

## 2014-04-23 DIAGNOSIS — R748 Abnormal levels of other serum enzymes: Secondary | ICD-10-CM

## 2014-04-23 LAB — HEPATIC FUNCTION PANEL
ALBUMIN: 3.9 g/dL (ref 3.5–5.2)
ALT: 19 U/L (ref 0–35)
AST: 25 U/L (ref 0–37)
Alkaline Phosphatase: 55 U/L (ref 39–117)
Bilirubin, Direct: 0.1 mg/dL (ref 0.0–0.3)
Total Bilirubin: 0.9 mg/dL (ref 0.2–1.2)
Total Protein: 6.8 g/dL (ref 6.0–8.3)

## 2014-04-23 LAB — LIPASE: Lipase: 44 U/L (ref 11.0–59.0)

## 2014-04-23 NOTE — Patient Instructions (Signed)
You have been scheduled for an endoscopy. Please follow written instructions given to you at your visit today. If you use inhalers (even only as needed), please bring them with you on the day of your procedure. Your physician has requested that you go to www.startemmi.com and enter the access code given to you at your visit today. This web site gives a general overview about your procedure. However, you should still follow specific instructions given to you by our office regarding your preparation for the procedure. You have been scheduled for an abdominal ultrasound at [facility] (1st floor of hospital) on 04/26/14 at 11 am. Please arrive 15 minutes prior to your appointment for registration. Make certain not to have anything to eat or drink 6 hours prior to your appointment. Should you need to reschedule your appointment, please contact radiology at [phone]. This test typically takes about 30 minutes to perform. Please take your Prilosec 20 mg 20-30 minutes prior to breakfast daily. Your physician has requested that you go to the basement for  lab work before leaving today. We will call you with all results as soon as available. CC:  Unk Pinto MD

## 2014-04-24 ENCOUNTER — Encounter: Payer: Self-pay | Admitting: Nurse Practitioner

## 2014-04-24 NOTE — Progress Notes (Signed)
HPI :  Patient is a 73 year old female known to Dr. Fuller Plan for a history of colon polyps. Her surveillance colonoscopy is due in 2017. Patient gives a longstanding history of IBS and always hurts from neck to pelvis despite normal BMs.   Patient comes in today for evaluation of intermittent epigastric /chest pain over the last few months. Pain radiates through to her back and episodes last anywhere from 15 -30 minutes. Pain not necessarily associated with eating.  She was hospitalized in June for the pain. LFTs ok, lipase was mildly elevated.  Cardiology evaluated, nuclear stress without significant abnormalities. Patient was started on PPI and really didn't have any significant episodes of pain through July. She stopped PPI a few days ago and on Sunday had two severe episodes of radiating epigastric pain.   Past Medical History  Diagnosis Date  . OSA (obstructive sleep apnea)   . Hiatal hernia   . IBS (irritable bowel syndrome)   . Fibrocystic breast disease   . Asthma   . Diverticulosis   . Adenomatous colon polyp 1994  . Internal hemorrhoids   . Melanoma     Facial  . Hyperlipidemia   . Hypertension   . GERD (gastroesophageal reflux disease)   . Osteoporosis   . Osteoarthritis   . Vitamin D deficiency   . Prediabetes   . Unspecified hypothyroidism   . Family history of ischemic heart disease     Family History  Problem Relation Age of Onset  . Emphysema Father   . Heart disease Father   . Heart attack Father 67    Died  . Heart disease Brother   . Rheum arthritis Sister   . Hypertension Mother   . Thyroid disease Mother   . Colon cancer Neg Hx    History  Substance Use Topics  . Smoking status: Never Smoker   . Smokeless tobacco: Never Used  . Alcohol Use: No   Current Outpatient Prescriptions  Medication Sig Dispense Refill  . albuterol (PROVENTIL HFA;VENTOLIN HFA) 108 (90 BASE) MCG/ACT inhaler Inhale 1-2 puffs into the lungs every 4 (four) hours as needed for  wheezing.  1 Inhaler  99  . ALPRAZolam (XANAX) 0.5 MG tablet Take 1/2 to 1 tablet at bedtime as needed for sleep  30 tablet  1  . atorvastatin (LIPITOR) 20 MG tablet Take 10 mg by mouth 2 (two) times a week.      . Cholecalciferol (VITAMIN D) 2000 UNITS CAPS Take 2 capsules by mouth daily.      . hyoscyamine (LEVSIN, ANASPAZ) 0.125 MG tablet Take 1 to 2 tablets every 4 hours as needed for nausea, cramping, bloating or diarrhea  30 tablet  1  . levothyroxine (SYNTHROID, LEVOTHROID) 50 MCG tablet Take 50 mcg by mouth daily before breakfast.      . Magnesium 200 MG TABS Take 1 tablet by mouth 4 (four) times a week.       . Multiple Vitamins-Minerals (MULTIVITAMIN WITH MINERALS) tablet Take 1 tablet by mouth daily.      [name] QVAR 80 MCG/ACT inhaler INHALE 2 PUFFS INTO THE LUNGS 2 TIMES DAILY.  8.7 g  0  . Zinc 50 MG TABS Take 25 mg by mouth daily.      [name] dicyclomine (BENTYL) 10 MG capsule Take 10 mg by mouth daily as needed for spasms.        No current facility-administered medications for this visit.   Allergies  Allergen Reactions  .  Actonel [Risedronate Sodium]   . Fosamax [Alendronate]   . Sulfa Antibiotics   . Tetracyclines & Related      Review of Systems: All systems reviewed and negative except where noted in HPI.   Dg Chest 2 View  04/09/2014   CLINICAL DATA:  Dyspnea.  EXAM: CHEST  2 VIEW  COMPARISON:  February 27, 2014.  FINDINGS: The heart size and mediastinal contours are within normal limits. Both lungs are clear. Hyperexpansion of the lungs is noted consistent with chronic obstructive pulmonary disease. No pneumothorax or pleural effusion is noted. The visualized skeletal structures are unremarkable.  IMPRESSION: No acute cardiopulmonary abnormality seen.   Electronically Signed   By: Sabino Dick M.D.   On: 04/09/2014 14:50    Physical Exam: BP 110/50  Pulse 66  Ht 5\' 4"  (1.626 m)  Wt 134 lb (60.782 kg)  BMI 22.99 kg/m2 Constitutional: Pleasant,well-developed, white female  in no acute distress. HEENT: Normocephalic and atraumatic. Conjunctivae are normal. No scleral icterus. Neck supple.  Cardiovascular: Normal rate, regular rhythm.  Pulmonary/chest: Effort normal and breath sounds normal. No wheezing, rales or rhonchi. Abdominal: Soft, nondistended, nontender. Bowel sounds active throughout. There are no masses palpable. No hepatomegaly. Extremities: no edema Lymphadenopathy: No cervical adenopathy noted. Neurological: Alert and oriented to person place and time. Skin: Skin is warm and dry. No rashes noted. Psychiatric: Normal mood and affect. Behavior is normal.   ASSESSMENT AND PLAN:  Pleasant 73 year old female with several month history of intermittent chest / epigastric pain radiating through to her back. Hospitalized in June for the pain. Inpatient cardiology evaluation in June was negative. Lipase mildly elevated at the time. No unusual weight loss and abdominal exam is not overly concerning.   Will repeat labs today.   Schedule u/s to evaluate for gallbladde / biliary disease.  Tentatively scheduled for EGD in case above studies non-diagnostic. The benefits, risks, and potential complications of EGD with possible biopsies were discussed with the patient and she agrees to proceed.

## 2014-04-26 ENCOUNTER — Ambulatory Visit (HOSPITAL_COMMUNITY)
Admission: RE | Admit: 2014-04-26 | Discharge: 2014-04-26 | Disposition: A | Discharge status: Home / Self Care | Payer: Medicare Other | Source: Ambulatory Visit | Attending: Gastroenterology | Admitting: Gastroenterology

## 2014-04-26 DIAGNOSIS — R748 Abnormal levels of other serum enzymes: Secondary | ICD-10-CM | POA: Diagnosis not present

## 2014-04-26 DIAGNOSIS — R1013 Epigastric pain: Secondary | ICD-10-CM | POA: Diagnosis present

## 2014-04-30 ENCOUNTER — Telehealth: Payer: Self-pay | Admitting: Gastroenterology

## 2014-04-30 NOTE — Telephone Encounter (Signed)
Verbal per Tye Savoy imaging study was negative pt advised to keep EGD as scheduled

## 2014-04-30 NOTE — Telephone Encounter (Signed)
Spoke with patient and she states when she eats salad, it comes out in her stool. Patient is scheduled for EGD next week. She will keep this appointment.

## 2014-05-01 ENCOUNTER — Ambulatory Visit: Payer: Medicare Other | Attending: Orthopedic Surgery

## 2014-05-01 DIAGNOSIS — IMO0001 Reserved for inherently not codable concepts without codable children: Secondary | ICD-10-CM | POA: Insufficient documentation

## 2014-05-01 DIAGNOSIS — M6281 Muscle weakness (generalized): Secondary | ICD-10-CM | POA: Diagnosis not present

## 2014-05-01 DIAGNOSIS — M546 Pain in thoracic spine: Secondary | ICD-10-CM | POA: Diagnosis not present

## 2014-05-02 ENCOUNTER — Telehealth: Payer: Self-pay | Admitting: Gastroenterology

## 2014-05-02 NOTE — Telephone Encounter (Signed)
Patient is calling for lab results. Please, advise.

## 2014-05-06 NOTE — Telephone Encounter (Signed)
Patient notified of her normal lab results.  All questions answered

## 2014-05-07 ENCOUNTER — Encounter: Payer: Self-pay | Admitting: Gastroenterology

## 2014-05-07 ENCOUNTER — Ambulatory Visit (AMBULATORY_SURGERY_CENTER): Payer: Medicare Other | Admitting: Gastroenterology

## 2014-05-07 VITALS — BP 119/77 | HR 64 | Temp 97.7°F | Resp 15 | Ht 64.0 in | Wt 134.0 lb

## 2014-05-07 DIAGNOSIS — R079 Chest pain, unspecified: Secondary | ICD-10-CM

## 2014-05-07 DIAGNOSIS — K2970 Gastritis, unspecified, without bleeding: Secondary | ICD-10-CM

## 2014-05-07 DIAGNOSIS — K222 Esophageal obstruction: Secondary | ICD-10-CM

## 2014-05-07 DIAGNOSIS — R1013 Epigastric pain: Secondary | ICD-10-CM

## 2014-05-07 DIAGNOSIS — K2990 Gastroduodenitis, unspecified, without bleeding: Secondary | ICD-10-CM

## 2014-05-07 DIAGNOSIS — D1330 Benign neoplasm of unspecified part of small intestine: Secondary | ICD-10-CM

## 2014-05-07 DIAGNOSIS — K2960 Other gastritis without bleeding: Secondary | ICD-10-CM

## 2014-05-07 MED ORDER — SODIUM CHLORIDE 0.9 % IV SOLN: 500.0000 mL | INTRAVENOUS | Status: DC

## 2014-05-07 MED ORDER — PANTOPRAZOLE SODIUM 40 MG PO TBEC: 40.0000 mg | DELAYED_RELEASE_TABLET | Freq: Every day | ORAL | Status: DC

## 2014-05-07 NOTE — Progress Notes (Signed)
Report to PACU, RN, vss, BBS= Clear.  

## 2014-05-07 NOTE — Patient Instructions (Addendum)

## 2014-05-07 NOTE — Progress Notes (Signed)
Called to room to assist during endoscopic procedure.  Patient ID and intended procedure confirmed with present staff. Received instructions for my participation in the procedure from the performing physician.  

## 2014-05-07 NOTE — Op Note (Signed)
[facility]  [address] [place], [address]   ENDOSCOPY PROCEDURE REPORT  PATIENT: Shannon, Parsons  MR#: [id number] BIRTHDATE: May 22, 1941 , 72  yrs. old GENDER: Female ENDOSCOPIST: Ladene Artist, MD, [facility] PROCEDURE DATE:  05/07/2014 PROCEDURE:  EGD w/ biopsy ASA CLASS:     Class III INDICATIONS:  Chest pain.   Epigastric pain. MEDICATIONS: MAC sedation, administered by CRNA and propofol (Diprivan) 160mg  IV TOPICAL ANESTHETIC: none DESCRIPTION OF PROCEDURE: After the risks benefits and alternatives of the procedure were thoroughly explained, informed consent was obtained.  The LB [id number] endoscope was introduced through the mouth and advanced to the second portion of the duodenum. Without limitations.  The instrument was slowly withdrawn as the mucosa was fully examined.  ESOPHAGUS: A mild, benign appearing stricture was found at the gastroesophageal junction.  The stenosis was traversable with the endoscope.   The esophagus was otherwise normal. STOMACH: Moderate erosive gastritis  was found in the gastric antrum.  Multiple biopsies were performed. Mild gastritis was found in the gastric body.  Multiple biopsies were performed.   The stomach otherwise appeared normal. DUODENUM: The duodenal mucosa showed no abnormalities in the bulb and second portion of the duodenum.  Cold forceps biopsies were taken in the bulb and second portion.  Retroflexed views revealed a small hiatal hernia.     The scope was then withdrawn from the patient and the procedure completed.  COMPLICATIONS: There were no complications.  ENDOSCOPIC IMPRESSION: 1.   Stricture at the gastroesophageal junction 2.   Small hiatal hernia 3.   Erosive gastritis in the gastric antrum; multiple biopsies 4.   Gastritis in the gastric body; multiple biopsies  RECOMMENDATIONS: 1.  Anti-reflux regimen long germ 2.  Await pathology results 3.  Continue PPI qd long term, pantoprazole  40 mg po qd, 1 year of refills 4.  Avoid NSAIDS  eSigned:  Ladene Artist, MD, [facility] 05/07/2014 8:53 AM

## 2014-05-08 ENCOUNTER — Telehealth: Payer: Self-pay | Admitting: *Deleted

## 2014-05-08 NOTE — Telephone Encounter (Signed)
  Follow up Call-  Call back number 05/07/2014 08/19/2011  Post procedure Call Back phone  # [phone]  Permission to leave phone message Yes -     Patient questions:  Do you have a fever, pain , or abdominal swelling? No. Pain Score  0 *  Have you tolerated food without any problems? Yes.    Have you been able to return to your normal activities? Yes.    Do you have any questions about your discharge instructions: Diet   No. Medications  No. Follow up visit  No.  Do you have questions or concerns about your Care? No.  Actions: * If pain score is 4 or above: No action needed, pain <4.

## 2014-05-13 ENCOUNTER — Ambulatory Visit: Payer: Medicare Other | Admitting: Physical Therapy

## 2014-05-13 ENCOUNTER — Encounter: Payer: Self-pay | Admitting: Gastroenterology

## 2014-05-13 DIAGNOSIS — IMO0001 Reserved for inherently not codable concepts without codable children: Secondary | ICD-10-CM | POA: Diagnosis not present

## 2014-05-15 ENCOUNTER — Ambulatory Visit: Payer: Medicare Other | Attending: Orthopedic Surgery | Admitting: Physical Therapy

## 2014-05-15 DIAGNOSIS — M546 Pain in thoracic spine: Secondary | ICD-10-CM | POA: Insufficient documentation

## 2014-05-15 DIAGNOSIS — M6281 Muscle weakness (generalized): Secondary | ICD-10-CM | POA: Diagnosis not present

## 2014-05-15 DIAGNOSIS — IMO0001 Reserved for inherently not codable concepts without codable children: Secondary | ICD-10-CM | POA: Diagnosis not present

## 2014-05-16 ENCOUNTER — Telehealth: Payer: Self-pay | Admitting: Gastroenterology

## 2014-05-16 NOTE — Telephone Encounter (Signed)
All questions answered.  She will call back for any additional questions or concerns.   

## 2014-05-21 ENCOUNTER — Ambulatory Visit: Payer: Medicare Other | Admitting: Physical Therapy

## 2014-05-21 DIAGNOSIS — IMO0001 Reserved for inherently not codable concepts without codable children: Secondary | ICD-10-CM | POA: Diagnosis not present

## 2014-05-23 ENCOUNTER — Encounter: Payer: Medicare Other | Admitting: Physical Therapy

## 2014-06-03 ENCOUNTER — Ambulatory Visit: Payer: Medicare Other | Admitting: Physical Therapy

## 2014-06-03 ENCOUNTER — Ambulatory Visit: Payer: Medicare Other | Admitting: Gastroenterology

## 2014-06-03 DIAGNOSIS — IMO0001 Reserved for inherently not codable concepts without codable children: Secondary | ICD-10-CM | POA: Diagnosis not present

## 2014-06-10 ENCOUNTER — Ambulatory Visit: Payer: Medicare Other | Admitting: Physical Therapy

## 2014-06-10 DIAGNOSIS — IMO0001 Reserved for inherently not codable concepts without codable children: Secondary | ICD-10-CM | POA: Diagnosis not present

## 2014-06-11 ENCOUNTER — Encounter: Payer: Self-pay | Admitting: Physician Assistant

## 2014-06-11 ENCOUNTER — Ambulatory Visit (INDEPENDENT_AMBULATORY_CARE_PROVIDER_SITE_OTHER): Payer: Medicare Other | Admitting: Physician Assistant

## 2014-06-11 VITALS — BP 110/62 | HR 60 | Temp 98.1°F | Resp 16 | Ht 63.5 in | Wt 137.0 lb

## 2014-06-11 DIAGNOSIS — J4521 Mild intermittent asthma with (acute) exacerbation: Secondary | ICD-10-CM

## 2014-06-11 DIAGNOSIS — E559 Vitamin D deficiency, unspecified: Secondary | ICD-10-CM

## 2014-06-11 DIAGNOSIS — K2100 Gastro-esophageal reflux disease with esophagitis, without bleeding: Secondary | ICD-10-CM

## 2014-06-11 DIAGNOSIS — Z1331 Encounter for screening for depression: Secondary | ICD-10-CM

## 2014-06-11 DIAGNOSIS — J45901 Unspecified asthma with (acute) exacerbation: Secondary | ICD-10-CM

## 2014-06-11 DIAGNOSIS — I10 Essential (primary) hypertension: Secondary | ICD-10-CM

## 2014-06-11 DIAGNOSIS — J449 Chronic obstructive pulmonary disease, unspecified: Secondary | ICD-10-CM

## 2014-06-11 DIAGNOSIS — E785 Hyperlipidemia, unspecified: Secondary | ICD-10-CM

## 2014-06-11 DIAGNOSIS — F411 Generalized anxiety disorder: Secondary | ICD-10-CM

## 2014-06-11 DIAGNOSIS — Z0000 Encounter for general adult medical examination without abnormal findings: Secondary | ICD-10-CM

## 2014-06-11 DIAGNOSIS — M810 Age-related osteoporosis without current pathological fracture: Secondary | ICD-10-CM

## 2014-06-11 DIAGNOSIS — Z79899 Other long term (current) drug therapy: Secondary | ICD-10-CM

## 2014-06-11 DIAGNOSIS — R7303 Prediabetes: Secondary | ICD-10-CM

## 2014-06-11 DIAGNOSIS — K589 Irritable bowel syndrome without diarrhea: Secondary | ICD-10-CM

## 2014-06-11 MED ORDER — AZITHROMYCIN 250 MG PO TABS: ORAL_TABLET | ORAL | Status: DC

## 2014-06-11 MED ORDER — PREDNISONE 20 MG PO TABS: ORAL_TABLET | ORAL | Status: DC

## 2014-06-11 MED ORDER — DEXAMETHASONE SODIUM PHOSPHATE 10 MG/ML IJ SOLN
10.0000 mg | Freq: Once | INTRAMUSCULAR | Status: AC
  Administered 2014-06-11: 10 mg via INTRAMUSCULAR

## 2014-06-11 NOTE — Patient Instructions (Signed)
Prednisone taper: Take 2 pills daily for 3 days then one pill daily for 2-3 days. Can hold onto the rest of the prednisone Can get on allergy pill Can do DuoNeb TID PRN Continue QVAR BID but if continue to have exacerbation than will need to step up Follow up Dr. Gwenette Greet Take Zpak if mucus increases, color change.

## 2014-06-11 NOTE — Progress Notes (Signed)
MEDICARE ANNUAL WELLNESS VISIT AND FOLLOW UP  Assessment:   1. Essential hypertension - continue medications, DASH diet, exercise and monitor at home. Call if greater than 130/80.   2. Chronic obstructive asthma, unspecified Continue QVAR, discussed increasing medication but she declines at this this time. Follow up Dr. Gwenette Greet  3. GERD Controlled diet/meds  4. IBS Controlled diet  5. Prediabetes Discussed general issues about diabetes pathophysiology and management., Educational material distributed., Suggested low cholesterol diet., Encouraged aerobic exercise., Discussed foot care., Reminded to get yearly retinal exam.  6. Osteoporosis, unspecified - DG Bone Density; Future  7. Anxiety state, unspecified controlled  8. Encounter for long-term (current) use of other medications  9. Hyperlipidemia -continue medications, check lipids, decrease fatty foods, increase activity.   10. Vitamin D deficiency  11. Asthma with acute exacerbation, mild intermittent zpak if worse, likely from allergies, get on allergy pill and dexamethasone given, if not better will switch QVAR to Dulera - dexamethasone (DECADRON) injection 10 mg; Inject 1 mL (10 mg total) into the muscle once.   Plan:   During the course of the visit the patient was educated and counseled about appropriate screening and preventive services including:    Pneumococcal vaccine   Influenza vaccine  Td vaccine  Screening electrocardiogram  Screening mammography  Bone densitometry screening  Colorectal cancer screening  Diabetes screening  Glaucoma screening  Nutrition counseling   Advanced directives: given info/requested  Screening recommendations, referrals:  Vaccinations: Please see documentation below and orders this visit.   Nutrition assessed and recommended  Colonoscopy up to date Mammogram due 12 2015 Pap smear not indicated Pelvic exam not indicated Recommended yearly  ophthalmology/optometry visit for glaucoma screening and checkup Recommended yearly dental visit for hygiene and checkup Advanced directives - requested  Conditions/risks identified: BMI: Discussed weight loss, diet, and increase physical activity.  Increase physical activity: AHA recommends 150 minutes of physical activity a week.  Medications reviewed DEXA- requested Diabetes is at goal, ACE/ARB therapy: No, Reason not on Ace Inhibitor/ARB therapy:  prediabetes Urinary Incontinence is not an issue: discussed non pharmacology and pharmacology options.  Fall risk: low- discussed PT, home fall assessment, medications.    Subjective:   Shannon Parsons is a 73 y.o. female who presents for Medicare Annual Wellness Visit and 3 month follow up on hypertension, prediabetes, hyperlipidemia, vitamin D def.  Date of last medicare wellness visit is unknown.   Her blood pressure has been controlled at home, today their BP is BP: 110/62 mmHg She does not workout. She denies chest pain, shortness of breath, dizziness.  She has asthma/COPD and sees Dr. Gwenette Greet, last PFTs 2012, last CXR 03/2014 showing COPD. She had a normal myoview 02/2014 and normal Echo 02/2014.  She has OSA and is on CPAP which she states helps her sleep.  She states for the past 1-2 weeks she has been having increasing mucus, increasing cough and shortness of breath. She has been using her QVAR daily and for the past 5 days DuoNeb once daily for the past week. She has tightness in her chest and back, denies fever, chills, CP, sinus problems.   Names of Other Physician/Practitioners you currently use: 1. [facility]- here for primary care 2. Dr. Herbert Deaner, eye doctor, last visit 2 years 3. Dr. Archie Balboa, dentist, last visit once yearly Patient Care Team: Unk Pinto, MD as PCP - General (Internal Medicine) Ladene Artist, MD as Consulting Physician (Gastroenterology) Satira Sark, MD as  Consulting  Physician (Cardiology) Kathee Delton, MD as Consulting Physician (Pulmonary Disease)  Medication Review Current Outpatient Prescriptions on File Prior to Visit  Medication Sig Dispense Refill  . albuterol (PROVENTIL HFA;VENTOLIN HFA) 108 (90 BASE) MCG/ACT inhaler Inhale 1-2 puffs into the lungs every 4 (four) hours as needed for wheezing.  1 Inhaler  99  . ALPRAZolam (XANAX) 0.5 MG tablet Take 1/2 to 1 tablet at bedtime as needed for sleep  30 tablet  1  . atorvastatin (LIPITOR) 20 MG tablet Take 10 mg by mouth 2 (two) times a week.      . Cholecalciferol (VITAMIN D) 2000 UNITS CAPS Take 2 capsules by mouth daily.      [name] dicyclomine (BENTYL) 10 MG capsule Take 10 mg by mouth daily as needed for spasms.       . hyoscyamine (LEVSIN, ANASPAZ) 0.125 MG tablet Take 1 to 2 tablets every 4 hours as needed for nausea, cramping, bloating or diarrhea  30 tablet  1  . levothyroxine (SYNTHROID, LEVOTHROID) 50 MCG tablet Take 50 mcg by mouth daily before breakfast.      . Magnesium 200 MG TABS Take 1 tablet by mouth 4 (four) times a week.       . Multiple Vitamins-Minerals (MULTIVITAMIN WITH MINERALS) tablet Take 1 tablet by mouth daily.      . pantoprazole (PROTONIX) 40 MG tablet Take 1 tablet (40 mg total) by mouth daily.  90 tablet  3  . QVAR 80 MCG/ACT inhaler INHALE 2 PUFFS INTO THE LUNGS 2 TIMES DAILY.  8.7 g  0  . Zinc 50 MG TABS Take 25 mg by mouth daily.       No current facility-administered medications on file prior to visit.    Current Problems (verified) Patient Active Problem List   Diagnosis Date Noted  . Chest pain 02/27/2014  . Anxiety state, unspecified 02/27/2014  . Family history of ischemic heart disease   . Encounter for long-term (current) use of other medications 12/20/2013  . Hyperlipidemia   . Hypertension   . GERD   . Osteoporosis, unspecified   . Osteoarthritis   . Vitamin D deficiency   . Prediabetes   . IBS   . Diverticulosis   . Personal history of  colonic polyps 07/19/2011  . Chronic obstructive asthma, unspecified 03/16/2011    Screening Tests Health Maintenance  Topic Date Due  . Tetanus/tdap  08/17/1962  . Influenza Vaccine  04/13/2014  . Mammogram  09/11/2015  . Colonoscopy  08/18/2021  . Pneumococcal Polysaccharide Vaccine Age 12 And Over  Completed  . Zostavax  Completed     Immunization History  Administered Date(s) Administered  . Pneumococcal Polysaccharide-23 06/18/2013  . Zoster 04/17/2013    Preventative care: Last colonoscopy: 2012 Last mammogram: 08/2013 Last pap smear/pelvic exam: declines  DEXA:07/2011 osteopenia Echo and myoview normal 02/2014 PFTS 2012 EGD 04/2014 CT check 01/2013, CXR 03/2014  Prior vaccinations: TD or Tdap: 1999 declines  Influenza: declines  Pneumococcal: 2014 Shingles/Zostavax: 2014  History reviewed: allergies, current medications, past family history, past medical history, past social history, past surgical history and problem list  Risk Factors: Osteoporosis/FallRisk: postmenopausal estrogen deficiency and dietary calcium and/or vitamin D deficiency In the past year have you fallen or had a near fall?:No History of fracture in the past year: no  Tobacco History  Substance Use Topics  . Smoking status: Never Smoker   . Smokeless tobacco: Never Used  . Alcohol Use: No   She does not smoke.  Patient is not a former smoker. Are there smokers in your home (other than you)?  No  Alcohol Current alcohol use: none  Caffeine Current caffeine use: coffee 1 /day  Exercise Current exercise: none  Nutrition/Diet Current diet: in general, a "healthy" diet    Cardiac risk factors: advanced age (older than 79 for men, 22 for women), dyslipidemia, hypertension, obesity (BMI >= 30 kg/m2) and sedentary lifestyle.  Depression Screen (Note: if answer to either of the following is "Yes", a more complete depression screening is indicated)   Q1: Over the past two weeks,  have you felt down, depressed or hopeless? No  Q2: Over the past two weeks, have you felt little interest or pleasure in doing things? No  Have you lost interest or pleasure in daily life? No  Do you often feel hopeless? No  Do you cry easily over simple problems? No  Activities of Daily Living In your present state of health, do you have any difficulty performing the following activities?:  Driving? No Managing money?  No Feeding yourself? No Getting from bed to chair? No Climbing a flight of stairs? No Preparing food and eating?: No Bathing or showering? No Getting dressed: No Getting to the toilet? No Using the toilet:No Moving around from place to place: No   Are you sexually active?  No  Do you have more than one partner?  No  Vision Difficulties: No  Hearing Difficulties: No Do you often ask people to speak up or repeat themselves? No Do you experience ringing or noises in your ears? No Do you have difficulty understanding soft or whispered voices? No  Cognition  Do you feel that you have a problem with memory?No  Do you often misplace items? No  Do you feel safe at home?  Yes  Advanced directives Does patient have a [place]? Yes Does patient have a Living Will? Yes   Objective:   Blood pressure 110/62, pulse 60, temperature 98.1 F (36.7 C), resp. rate 16, height 5' 3.5" (1.613 m), weight 137 lb (62.143 kg), SpO2 99.00%. Body mass index is 23.88 kg/(m^2).  General appearance: alert, no distress, WD/WN,  female Cognitive Testing  Alert? Yes  Normal Appearance?Yes  Oriented to person? Yes  Place? Yes   Time? Yes  Recall of three objects?  Yes  Can perform simple calculations? Yes  Displays appropriate judgment?Yes  Can read the correct time from a watch face?Yes  HEENT: normocephalic, sclerae anicteric, TMs pearly, nares patent, no discharge or erythema, pharynx normal Oral cavity: MMM, no lesions Neck: supple, no lymphadenopathy,  no thyromegaly, no masses Heart: RRR, normal S1, S2, no murmurs Lungs: CTA bilaterally, mild wheezing/tightness, rhonchi, or rales Abdomen: +bs, soft, non tender, non distended, no masses, no hepatomegaly, no splenomegaly Musculoskeletal: nontender, no swelling, no obvious deformity Extremities: no edema, no cyanosis, no clubbing Pulses: 2+ symmetric, upper and lower extremities, normal cap refill Neurological: alert, oriented x 3, CN2-12 intact, strength normal upper extremities and lower extremities, sensation normal throughout, DTRs 2+ throughout, no cerebellar signs, gait normal Psychiatric: normal affect, behavior normal, pleasant  Breast: defer Gyn: defer Rectal: defer  Medicare Attestation I have personally reviewed: The patient's medical and social history Their use of alcohol, tobacco or illicit drugs Their current medications and supplements The patient's functional ability including ADLs,fall risks, home safety risks, cognitive, and hearing and visual impairment Diet and physical activities Evidence for depression or mood disorders  The patient's weight, height, BMI, and visual  acuity have been recorded in the chart.  I have made referrals, counseling, and provided education to the patient based on review of the above and I have provided the patient with a written personalized care plan for preventive services.     Vicie Mutters, PA-C   06/11/2014

## 2014-06-17 ENCOUNTER — Telehealth: Payer: Self-pay

## 2014-06-17 NOTE — Telephone Encounter (Signed)
Patient is scheduled to have a bone density scan at [facility] on 07-03-14 at 9:15am. No calcium the day before or day of scan. Left message for patient to return call for appointment details. Mailed letter to patient with appointment details.

## 2014-06-17 NOTE — Telephone Encounter (Signed)
Message copied by Nadyne Coombes on Mon Jun 17, 2014  3:03 PM ------      Message from: Vladimir Crofts      Created: Tue Jun 11, 2014 10:31 AM       DEXA ------

## 2014-06-18 ENCOUNTER — Encounter: Payer: Self-pay | Admitting: Internal Medicine

## 2014-07-03 ENCOUNTER — Ambulatory Visit (HOSPITAL_COMMUNITY): Payer: Medicare Other

## 2014-07-03 ENCOUNTER — Ambulatory Visit (HOSPITAL_COMMUNITY)
Admission: RE | Admit: 2014-07-03 | Discharge: 2014-07-03 | Disposition: A | Discharge status: Home / Self Care | Payer: Medicare Other | Source: Ambulatory Visit | Attending: Physician Assistant | Admitting: Physician Assistant

## 2014-07-03 DIAGNOSIS — Z13820 Encounter for screening for osteoporosis: Secondary | ICD-10-CM | POA: Diagnosis present

## 2014-07-03 DIAGNOSIS — Z780 Asymptomatic menopausal state: Secondary | ICD-10-CM | POA: Diagnosis not present

## 2014-07-03 DIAGNOSIS — M810 Age-related osteoporosis without current pathological fracture: Secondary | ICD-10-CM

## 2014-07-05 ENCOUNTER — Ambulatory Visit (INDEPENDENT_AMBULATORY_CARE_PROVIDER_SITE_OTHER): Payer: Medicare Other | Admitting: Pulmonary Disease

## 2014-07-05 ENCOUNTER — Encounter: Payer: Self-pay | Admitting: Pulmonary Disease

## 2014-07-05 VITALS — BP 120/74 | HR 60 | Temp 97.5°F | Ht 63.25 in | Wt 138.2 lb

## 2014-07-05 DIAGNOSIS — G4733 Obstructive sleep apnea (adult) (pediatric): Secondary | ICD-10-CM | POA: Insufficient documentation

## 2014-07-05 DIAGNOSIS — J449 Chronic obstructive pulmonary disease, unspecified: Secondary | ICD-10-CM

## 2014-07-05 NOTE — Patient Instructions (Signed)
Make sure you take your qvar 2 inhalations am and pm everyday.  If you are continuing to have flareups, then we can consider changing to dulera.   Take zyrtec 10mg  each night at bedtime during the fall and spring allergy seasons.  If it is not controlling your allergy symptoms, would add singulair.  Just let me know. Will have your home care company send [place] a download off your cpap machine to check on things. followup with me again in 72mos.

## 2014-07-05 NOTE — Progress Notes (Signed)
   Subjective:    Patient ID: Shannon Parsons, female    DOB: [birth year], 73 y.o.   MRN: [id number]  HPI The patient comes in today for followup of her known asthma. She has had multiple flares since last visit, but unfortunately has not been compliant with her inhaled corticosteroid. She has only been using Qvar one puff twice a day instead of 2. She is also having increased allergy symptoms during the spring and fall which may be contributing to this, but has not been taking any type of allergy medication on a regular basis. Finally, the patient also is asking about her sleep apnea and CPAP. I have never seen her for this, nor has she ever seeing a sleep specialist. She is wondering if someone needs to check her CPAP device. She feels that she is sleeping well with it, although only gets about 4 hours of sleep a night.   Review of Systems  Constitutional: Negative for fever and unexpected weight change.  HENT: Negative for congestion, dental problem, ear pain, nosebleeds, postnasal drip, rhinorrhea, sinus pressure, sneezing, sore throat and trouble swallowing.   Eyes: Negative for redness and itching.  Respiratory: Positive for shortness of breath. Negative for cough, chest tightness and wheezing.   Cardiovascular: Negative for palpitations and leg swelling.  Gastrointestinal: Negative for nausea and vomiting.  Genitourinary: Negative for dysuria.  Musculoskeletal: Negative for joint swelling.  Skin: Negative for rash.  Neurological: Negative for headaches.  Hematological: Does not bruise/bleed easily.  Psychiatric/Behavioral: Negative for dysphoric mood. The patient is not nervous/anxious.        Objective:   Physical Exam Well-developed female in no acute distress Nose without purulence or discharge noted Neck without lymphadenopathy or thyromegaly Chest totally clear to auscultation, no wheezing Cardiac exam with regular rate and rhythm Lower extremities without edema, no  cyanosis Alert and oriented, moves all 4 extremities.       Assessment & Plan:

## 2014-07-05 NOTE — Assessment & Plan Note (Signed)
The patient has been having recurrent flares this year, but unfortunately has not been taking her inhaled corticosteroid compliantly. She has been using her Qvar one puff twice a day instead of 2 puffs twice a day. I have discussed with her possibly changing to dulera, but she would like to try the recommended dose of Qvar first. She is having increased allergy symptoms during the spring and fall, and is not taking an antihistamine during those times. I also discussed with her trying Singulair. Would rather start with an antihistamine. She tells me that she has had allergy vaccine in the distant past.

## 2014-07-05 NOTE — Assessment & Plan Note (Signed)
The patient is asking today about her CPAP, and has not had any followup since her diagnosis in 2012. She is wearing her device compliantly, and has been keeping up with supplies. She feels that she does sleep better while wearing the device, but she has not had any downloads to look at how things are going.  Will get a download from her home care company and give her a call.

## 2014-07-06 ENCOUNTER — Other Ambulatory Visit: Payer: Self-pay | Admitting: Pulmonary Disease

## 2014-07-15 ENCOUNTER — Ambulatory Visit (INDEPENDENT_AMBULATORY_CARE_PROVIDER_SITE_OTHER): Payer: Medicare Other | Admitting: Internal Medicine

## 2014-07-15 ENCOUNTER — Encounter: Payer: Self-pay | Admitting: Internal Medicine

## 2014-07-15 VITALS — BP 116/82 | HR 60 | Temp 97.9°F | Resp 16 | Ht 63.5 in | Wt 136.2 lb

## 2014-07-15 DIAGNOSIS — Z1212 Encounter for screening for malignant neoplasm of rectum: Secondary | ICD-10-CM

## 2014-07-15 DIAGNOSIS — Z79899 Other long term (current) drug therapy: Secondary | ICD-10-CM | POA: Insufficient documentation

## 2014-07-15 DIAGNOSIS — Z9181 History of falling: Secondary | ICD-10-CM

## 2014-07-15 DIAGNOSIS — E559 Vitamin D deficiency, unspecified: Secondary | ICD-10-CM

## 2014-07-15 DIAGNOSIS — R6889 Other general symptoms and signs: Secondary | ICD-10-CM

## 2014-07-15 DIAGNOSIS — Z1331 Encounter for screening for depression: Secondary | ICD-10-CM

## 2014-07-15 DIAGNOSIS — Z0001 Encounter for general adult medical examination with abnormal findings: Secondary | ICD-10-CM

## 2014-07-15 DIAGNOSIS — E785 Hyperlipidemia, unspecified: Secondary | ICD-10-CM

## 2014-07-15 DIAGNOSIS — G4700 Insomnia, unspecified: Secondary | ICD-10-CM

## 2014-07-15 DIAGNOSIS — I10 Essential (primary) hypertension: Secondary | ICD-10-CM

## 2014-07-15 DIAGNOSIS — R7303 Prediabetes: Secondary | ICD-10-CM

## 2014-07-15 DIAGNOSIS — G250 Essential tremor: Secondary | ICD-10-CM

## 2014-07-15 LAB — CBC WITH DIFFERENTIAL/PLATELET
BASOS ABS: 0.1 10*3/uL (ref 0.0–0.1)
BASOS PCT: 1 % (ref 0–1)
EOS ABS: 0.3 10*3/uL (ref 0.0–0.7)
EOS PCT: 3 % (ref 0–5)
HCT: 41 % (ref 36.0–46.0)
Hemoglobin: 13.9 g/dL (ref 12.0–15.0)
LYMPHS PCT: 43 % (ref 12–46)
Lymphs Abs: 3.8 10*3/uL (ref 0.7–4.0)
MCH: 27.9 pg (ref 26.0–34.0)
MCHC: 33.9 g/dL (ref 30.0–36.0)
MCV: 82.2 fL (ref 78.0–100.0)
MONO ABS: 0.7 10*3/uL (ref 0.1–1.0)
Monocytes Relative: 8 % (ref 3–12)
Neutro Abs: 4 10*3/uL (ref 1.7–7.7)
Neutrophils Relative %: 45 % (ref 43–77)
Platelets: 283 10*3/uL (ref 150–400)
RBC: 4.99 MIL/uL (ref 3.87–5.11)
RDW: 14.4 % (ref 11.5–15.5)
WBC: 8.9 10*3/uL (ref 4.0–10.5)

## 2014-07-15 MED ORDER — ALPRAZOLAM 0.5 MG PO TABS: ORAL_TABLET | ORAL | Status: DC

## 2014-07-15 NOTE — Progress Notes (Signed)
Patient ID: Shannon Parsons, female   DOB: Dec 25, 1940, 73 y.o.   MRN: [id number]  Annual Screening Comprehensive Examination  This very nice 73 y.o.MWF presents for complete physical.  Patient has been followed for HTN,  Prediabetes, Hyperlipidemia, and Vitamin D Deficiency.    Patient has hx/o prior elevated BP in the past and has been monitored expectantly.  Patient's BP has been controlled at home and patient denies any cardiac symptoms as chest pain, palpitations, shortness of breath, dizziness or ankle swelling. Today's BP: 116/82 mmHg    Patient's hyperlipidemia is controlled with diet and medications. Patient denies myalgias or other medication SE's. Last lipids were near goal with Total Chol 176; HDL 49; LDL  109*; Trig 92 on 02/28/2014.   Patient has prediabetes predating since Nov 2012 (A1c 5.9%) and patient denies reactive hypoglycemic symptoms, visual blurring, diabetic polys, or paresthesias. Last A1c was  5.8% on 02/27/2014.   Finally, patient has history of Vitamin D Deficiency and last Vitamin D was  83 on 12/20/2013.  Medication Sig  . albuterol (PROVENTIL HFA;VENTOLIN HFA) 108 (90 BASE) MCG/ACT inhaler Inhale 1-2 puffs into the lungs every 4 (four) hours as needed for wheezing.  [name] ALPRAZolam (XANAX) 0.5 MG tablet Take 1/2 to 1 tablet at bedtime as needed for sleep  . beclomethasone (QVAR) 80 MCG/ACT inhaler INHALE 1 PUFFS INTO THE LUNGS 2 TIMES DAILY.  [name] Cholecalciferol (VITAMIN D) 2000 UNITS CAPS Take 2 capsules by mouth daily.  [name] dicyclomine (BENTYL) 10 MG capsule Take 10 mg by mouth daily as needed for spasms.   . hyoscyamine (LEVSIN, ANASPAZ) 0.125 MG tablet Take 1 to 2 tablets every 4 hours as needed for nausea, cramping, bloating or diarrhea  . levothyroxine (SYNTHROID, LEVOTHROID) 50 MCG tablet Take 50 mcg by mouth daily before breakfast.  . Magnesium 200 MG TABS Take 1 tablet by mouth 4 (four) times a week.   . Multiple Vitamins-Minerals (MULTIVITAMIN WITH MINERALS) tablet  Take 1 tablet by mouth daily.  . pantoprazole (PROTONIX) 40 MG tablet Take 1 tablet (40 mg total) by mouth daily.  [name] QVAR 80 MCG/ACT inhaler INHALE 2 PUFFS INTO THE LUNGS 2 TIMES DAILY.  [name] Zinc 50 MG TABS Take 25 mg by mouth daily.   Allergies  Allergen Reactions  . Actonel [Risedronate Sodium]   . Fosamax [Alendronate]   . Sulfa Antibiotics   . Tetracyclines & Related    Past Medical History  Diagnosis Date  . OSA (obstructive sleep apnea)   . Hiatal hernia   . IBS (irritable bowel syndrome)   . Fibrocystic breast disease   . Asthma   . Diverticulosis   . Adenomatous colon polyp 1994  . Internal hemorrhoids   . Melanoma     Facial  . Hyperlipidemia   . Hypertension   . GERD (gastroesophageal reflux disease)   . Osteoporosis   . Osteoarthritis   . Vitamin D deficiency   . Prediabetes   . Unspecified hypothyroidism   . Family history of ischemic heart disease    Health Maintenance  Topic Date Due  . TETANUS/TDAP  08/17/1962  . INFLUENZA VACCINE  Originally 04/13/2014  . MAMMOGRAM  09/11/2015  . COLONOSCOPY  08/18/2021   Immunization History  Administered Date(s) Administered  . Pneumococcal Polysaccharide-23 06/18/2013  . Zoster 04/17/2013   Past Surgical History  Procedure Laterality Date  . Appendectomy    . Tonsillectomy    . Bunionectomy    . Knee arthroscopy    . Umbilical  hernia repair     Family History  Problem Relation Age of Onset  . Emphysema Father   . Heart disease Father   . Heart attack Father 35    Died  . Heart disease Brother   . Rheum arthritis Sister   . Hypertension Mother   . Thyroid disease Mother   . Colon cancer Neg Hx     History  Substance Use Topics  . Smoking status: Never Smoker   . Smokeless tobacco: Never Used  . Alcohol Use: No     ROS Constitutional: Denies fever, chills, weight loss/gain, headaches, insomnia, fatigue, night sweats, and change in appetite. Eyes: Denies redness, blurred vision, diplopia,  discharge, itchy, watery eyes.  ENT: Denies discharge, congestion, post nasal drip, epistaxis, sore throat, earache, hearing loss, dental pain, Tinnitus, Vertigo, Sinus pain, snoring.  Cardio: Denies chest pain, palpitations, irregular heartbeat, syncope, dyspnea, diaphoresis, orthopnea, PND, claudication, edema Respiratory: denies cough, dyspnea, DOE, pleurisy, hoarseness, laryngitis, wheezing.  Gastrointestinal: Denies dysphagia, heartburn, reflux, water brash, pain, cramps, nausea, vomiting, bloating, diarrhea, constipation, hematemesis, melena, hematochezia, jaundice, hemorrhoids Genitourinary: Denies dysuria, frequency, urgency, nocturia, hesitancy, discharge, hematuria, flank pain Breast: Breast lumps, nipple discharge, bleeding.  Musculoskeletal: Denies arthralgia, myalgia, stiffness, Jt. Swelling, pain, limp, and strain/sprain. Denies falls. Skin: Denies puritis, rash, hives, warts, acne, eczema, changing in skin lesion Neuro: No weakness, tremor, incoordination, spasms, paresthesia, pain Psychiatric: Denies confusion, memory loss, sensory loss. Denies Depression. Endocrine: Denies change in weight, skin, hair change, nocturia, and paresthesia, diabetic polys, visual blurring, hyper / hypo glycemic episodes.  Heme/Lymph: No excessive bleeding, bruising, enlarged lymph nodes.  Physical Exam  BP 116/82 mmHg  Pulse 60  Temp(Src) 97.9 F (36.6 C)  Resp 16  Ht 5' 3.5" (1.613 m)  Wt 136 lb 3.2 oz (61.78 kg)  BMI 23.75 kg/m2  General Appearance: Well nourished and in no apparent distress. Eyes: PERRLA, EOMs, conjunctiva no swelling or erythema, normal fundi and vessels. Sinuses: No frontal/maxillary tenderness ENT/Mouth: EACs patent / TMs  nl. Nares clear without erythema, swelling, mucoid exudates. Oral hygiene is good. No erythema, swelling, or exudate. Tongue normal, non-obstructing. Tonsils not swollen or erythematous. Hearing normal.  Neck: Supple, thyroid normal. No bruits, nodes  or JVD. Respiratory: Respiratory effort normal.  BS equal and clear bilateral without rales, rhonci, wheezing or stridor. Cardio: Heart sounds are normal with regular rate and rhythm and no murmurs, rubs or gallops. Peripheral pulses are normal and equal bilaterally without edema. No aortic or femoral bruits. Chest: symmetric with normal excursions and percussion. Breasts: Symmetric, without lumps, nipple discharge, retractions, or fibrocystic changes.  Abdomen: Flat, soft, with bowl sounds. Nontender, no guarding, rebound, hernias, masses, or organomegaly.  Lymphatics: Non tender without lymphadenopathy.  Genitourinary:  Musculoskeletal: Full ROM all peripheral extremities, joint stability, 5/5 strength, and normal gait. Skin: Warm and dry without rashes, lesions, cyanosis, clubbing or  ecchymosis.  Neuro: Head titubation. Cranial nerves intact, reflexes equal bilaterally. Normal muscle tone, no cerebellar symptoms. Sensation intact.  Pysch: Awake and oriented X 3, normal affect, Insight and Judgment appropriate.   Assessment and Plan  1. Annual Screening Examination 2. Hypertension  3. Hyperlipidemia 4. Pre Diabetes 5. Vitamin D Deficiency 6. Essential Tremor 7. Osteoporosis    Continue prudent diet as discussed, weight control, BP monitoring, regular exercise, and medications. Discussed med's effects and SE's. Screening labs and tests as requested with regular follow-up as recommended.   In view of hx/o GI intolerance to Fosamax & Actonel, patient defers any  treatment for her osteoporosis (which is borderline).

## 2014-07-15 NOTE — Patient Instructions (Signed)
Recommend the book "The END of DIETING" by Dr Baker Janus   and the book "The END of DIABETES " by Dr Excell Seltzer  At [email or web address] - get book & Audio CD's      Being diabetic has a  300% increased risk for heart attack, stroke, cancer, and alzheimer- type vascular dementia. It is very important that you work harder with diet by avoiding all foods that are white except chicken & fish. Avoid white rice (brown & wild rice is OK), white potatoes (sweetpotatoes in moderation is OK), White bread or wheat bread or anything made out of white flour like bagels, donuts, rolls, buns, biscuits, cakes, pastries, cookies, pizza crust, and pasta (made from white flour & egg whites) - vegetarian pasta or spinach or wheat pasta is OK. Multigrain breads like Arnold's or [employer], or multigrain sandwich thins or flatbreads.  Diet, exercise and weight loss can reverse and cure diabetes in the early stages.  Diet, exercise and weight loss is very important in the control and prevention of complications of diabetes which affects every system in your body, ie. Brain - dementia/stroke, eyes - glaucoma/blindness, heart - heart attack/heart failure, kidneys - dialysis, stomach - gastric paralysis, intestines - malabsorption, nerves - severe painful neuritis, circulation - gangrene & loss of a leg(s), and finally cancer and Alzheimers.    I recommend avoid fried & greasy foods,  sweets/candy, white rice (brown or wild rice or Quinoa is OK), white potatoes (sweet potatoes are OK) - anything made from white flour - bagels, doughnuts, rolls, buns, biscuits,white and wheat breads, pizza crust and traditional pasta made of white flour & egg white(vegetarian pasta or spinach or wheat pasta is OK).  Multi-grain bread is OK - like multi-grain flat bread or sandwich thins. Avoid alcohol in excess. Exercise is also important.    Eat all the vegetables you want - avoid meat, especially red meat and dairy - especially cheese.  Cheese  is the most concentrated form of trans-fats which is the worst thing to clog up our arteries. Veggie cheese is OK which can be found in the fresh produce section at [employer] or [employer] or [employer]  Preventive Care for Adults A healthy lifestyle and preventive care can promote health and wellness. Preventive health guidelines for women include the following key practices.  A routine yearly physical is a good way to check with your health care provider about your health and preventive screening. It is a chance to share any concerns and updates on your health and to receive a thorough exam.  Visit your dentist for a routine exam and preventive care every 6 months. Brush your teeth twice a day and floss once a day. Good oral hygiene prevents tooth decay and gum disease.  The frequency of eye exams is based on your age, health, family medical history, use of contact lenses, and other factors. Follow your health care provider's recommendations for frequency of eye exams.  Eat a healthy diet. Foods like vegetables, fruits, whole grains, low-fat dairy products, and lean protein foods contain the nutrients you need without too many calories. Decrease your intake of foods high in solid fats, added sugars, and salt. Eat the right amount of calories for you.Get information about a proper diet from your health care provider, if necessary.  Regular physical exercise is one of the most important things you can do for your health. Most adults should get at least 150 minutes of moderate-intensity exercise (any activity that increases  your heart rate and causes you to sweat) each week. In addition, most adults need muscle-strengthening exercises on 2 or more days a week.  Maintain a healthy weight. The body mass index (BMI) is a screening tool to identify possible weight problems. It provides an estimate of body fat based on height and weight. Your health care provider can find your BMI and can help you  achieve or maintain a healthy weight.For adults 20 years and older:  A BMI below 18.5 is considered underweight.  A BMI of 18.5 to 24.9 is normal.  A BMI of 25 to 29.9 is considered overweight.  A BMI of 30 and above is considered obese.  Maintain normal blood lipids and cholesterol levels by exercising and minimizing your intake of saturated fat. Eat a balanced diet with plenty of fruit and vegetables. If your lipid or cholesterol levels are high, you are over 50, or you are at high risk for heart disease, you may need your cholesterol levels checked more frequently.Ongoing high lipid and cholesterol levels should be treated with medicines if diet and exercise are not working.  If you smoke, find out from your health care provider how to quit. If you do not use tobacco, do not start.  Lung cancer screening is recommended for adults aged 55-80 years who are at high risk for developing lung cancer because of a history of smoking. A yearly low-dose CT scan of the lungs is recommended for people who have at least a 30-pack-year history of smoking and are a current smoker or have quit within the past 15 years. A pack year of smoking is smoking an average of 1 pack of cigarettes a day for 1 year (for example: 1 pack a day for 30 years or 2 packs a day for 15 years). Yearly screening should continue until the smoker has stopped smoking for at least 15 years. Yearly screening should be stopped for people who develop a health problem that would prevent them from having lung cancer treatment.  Avoid use of street drugs. Do not share needles with anyone. Ask for help if you need support or instructions about stopping the use of drugs.  High blood pressure causes heart disease and increases the risk of stroke.  Ongoing high blood pressure should be treated with medicines if weight loss and exercise do not work.  If you are 55-79 years old, ask your health care provider if you should take aspirin to  prevent strokes.  Diabetes screening involves taking a blood sample to check your fasting blood sugar level. This should be done once every 3 years, after age 45, if you are within normal weight and without risk factors for diabetes. Testing should be considered at a younger age or be carried out more frequently if you are overweight and have at least 1 risk factor for diabetes.  Breast cancer screening is essential preventive care for women. You should practice "breast self-awareness." This means understanding the normal appearance and feel of your breasts and may include breast self-examination. Any changes detected, no matter how small, should be reported to a health care provider. Women in their 20s and 30s should have a clinical breast exam (CBE) by a health care provider as part of a regular health exam every 1 to 3 years. After age 40, women should have a CBE every year. Starting at age 40, women should consider having a mammogram (breast X-ray test) every year. Women who have a family history of breast cancer should   talk to their health care provider about genetic screening. Women at a high risk of breast cancer should talk to their health care providers about having an MRI and a mammogram every year.  Breast cancer gene (BRCA)-related cancer risk assessment is recommended for women who have family members with BRCA-related cancers. BRCA-related cancers include breast, ovarian, tubal, and peritoneal cancers. Having family members with these cancers may be associated with an increased risk for harmful changes (mutations) in the breast cancer genes BRCA1 and BRCA2. Results of the assessment will determine the need for genetic counseling and BRCA1 and BRCA2 testing.  Routine pelvic exams to screen for cancer are no longer recommended for nonpregnant women who are considered low risk for cancer of the pelvic organs (ovaries, uterus, and vagina) and who do not have symptoms. Ask your health care provider  if a screening pelvic exam is right for you.  If you have had past treatment for cervical cancer or a condition that could lead to cancer, you need Pap tests and screening for cancer for at least 20 years after your treatment. If Pap tests have been discontinued, your risk factors (such as having a new sexual partner) need to be reassessed to determine if screening should be resumed. Some women have medical problems that increase the chance of getting cervical cancer. In these cases, your health care provider may recommend more frequent screening and Pap tests.    Colorectal cancer can be detected and often prevented. Most routine colorectal cancer screening begins at the age of 90 years and continues through age 8 years. However, your health care provider may recommend screening at an earlier age if you have risk factors for colon cancer. On a yearly basis, your health care provider may provide home test kits to check for hidden blood in the stool. Use of a small camera at the end of a tube, to directly examine the colon (sigmoidoscopy or colonoscopy), can detect the earliest forms of colorectal cancer. Talk to your health care provider about this at age 86, when routine screening begins. Direct exam of the colon should be repeated every 5-10 years through age 32 years, unless early forms of pre-cancerous polyps or small growths are found.  Osteoporosis is a disease in which the bones lose minerals and strength with aging. This can result in serious bone fractures or breaks. The risk of osteoporosis can be identified using a bone density scan. Women ages 75 years and over and women at risk for fractures or osteoporosis should discuss screening with their health care providers. Ask your health care provider whether you should take a calcium supplement or vitamin D to reduce the rate of osteoporosis.  Menopause can be associated with physical symptoms and risks. Hormone replacement therapy is available to  decrease symptoms and risks. You should talk to your health care provider about whether hormone replacement therapy is right for you.  Use sunscreen. Apply sunscreen liberally and repeatedly throughout the day. You should seek shade when your shadow is shorter than you. Protect yourself by wearing long sleeves, pants, a wide-brimmed hat, and sunglasses year round, whenever you are outdoors.  Once a month, do a whole body skin exam, using a mirror to look at the skin on your back. Tell your health care provider of new moles, moles that have irregular borders, moles that are larger than a pencil eraser, or moles that have changed in shape or color.  Stay current with required vaccines (immunizations).  Influenza vaccine. All adults  should be immunized every year.  Tetanus, diphtheria, and acellular pertussis (Td, Tdap) vaccine. Pregnant women should receive 1 dose of Tdap vaccine during each pregnancy. The dose should be obtained regardless of the length of time since the last dose. Immunization is preferred during the 27th-36th week of gestation. An adult who has not previously received Tdap or who does not know her vaccine status should receive 1 dose of Tdap. This initial dose should be followed by tetanus and diphtheria toxoids (Td) booster doses every 10 years. Adults with an unknown or incomplete history of completing a 3-dose immunization series with Td-containing vaccines should begin or complete a primary immunization series including a Tdap dose. Adults should receive a Td booster every 10 years.    Zoster vaccine. One dose is recommended for adults aged 44 years or older unless certain conditions are present.    Pneumococcal 13-valent conjugate (PCV13) vaccine. When indicated, a person who is uncertain of her immunization history and has no record of immunization should receive the PCV13 vaccine. An adult aged 74 years or older who has certain medical conditions and has not been previously  immunized should receive 1 dose of PCV13 vaccine. This PCV13 should be followed with a dose of pneumococcal polysaccharide (PPSV23) vaccine. The PPSV23 vaccine dose should be obtained at least 8 weeks after the dose of PCV13 vaccine. An adult aged 66 years or older who has certain medical conditions and previously received 1 or more doses of PPSV23 vaccine should receive 1 dose of PCV13. The PCV13 vaccine dose should be obtained 1 or more years after the last PPSV23 vaccine dose.    Pneumococcal polysaccharide (PPSV23) vaccine. When PCV13 is also indicated, PCV13 should be obtained first. All adults aged 89 years and older should be immunized. An adult younger than age 80 years who has certain medical conditions should be immunized. Any person who resides in a nursing home or long-term care facility should be immunized. An adult smoker should be immunized. People with an immunocompromised condition and certain other conditions should receive both PCV13 and PPSV23 vaccines. People with human immunodeficiency virus (HIV) infection should be immunized as soon as possible after diagnosis. Immunization during chemotherapy or radiation therapy should be avoided. Routine use of PPSV23 vaccine is not recommended for American Indians, [place] Natives, or people younger than 65 years unless there are medical conditions that require PPSV23 vaccine. When indicated, people who have unknown immunization and have no record of immunization should receive PPSV23 vaccine. One-time revaccination 5 years after the first dose of PPSV23 is recommended for people aged 19-64 years who have chronic kidney failure, nephrotic syndrome, asplenia, or immunocompromised conditions. People who received 1-2 doses of PPSV23 before age 34 years should receive another dose of PPSV23 vaccine at age 31 years or later if at least 5 years have passed since the previous dose. Doses of PPSV23 are not needed for people immunized with PPSV23 at or after  age 29 years.   Preventive Services / Frequency  Ages 55 years and over  Blood pressure check.  Lipid and cholesterol check.  Lung cancer screening. / Every year if you are aged 29-80 years and have a 30-pack-year history of smoking and currently smoke or have quit within the past 15 years. Yearly screening is stopped once you have quit smoking for at least 15 years or develop a health problem that would prevent you from having lung cancer treatment.  Clinical breast exam.** / Every year after age 40 years.  BRCA-related cancer risk assessment.** / For women who have family members with a BRCA-related cancer (breast, ovarian, tubal, or peritoneal cancers).  Mammogram.** / Every year beginning at age 40 years and continuing for as long as you are in good health. Consult with your health care provider.  Pap test.** / Every 3 years starting at age 30 years through age 65 or 70 years with 3 consecutive normal Pap tests. Testing can be stopped between 65 and 70 years with 3 consecutive normal Pap tests and no abnormal Pap or HPV tests in the past 10 years.  Fecal occult blood test (FOBT) of stool. / Every year beginning at age 50 years and continuing until age 75 years. You may not need to do this test if you get a colonoscopy every 10 years.  Flexible sigmoidoscopy or colonoscopy.** / Every 5 years for a flexible sigmoidoscopy or every 10 years for a colonoscopy beginning at age 50 years and continuing until age 75 years.  Hepatitis C blood test.** / For all people born from 1945 through 1965 and any individual with known risks for hepatitis C.  Osteoporosis screening.** / A one-time screening for women ages 65 years and over and women at risk for fractures or osteoporosis.  Skin self-exam. / Monthly.  Influenza vaccine. / Every year.  Tetanus, diphtheria, and acellular pertussis (Tdap/Td) vaccine.** / 1 dose of Td every 10 years.  Zoster vaccine.** / 1 dose for adults aged 60 years  or older.  Pneumococcal 13-valent conjugate (PCV13) vaccine.** / Consult your health care provider.  Pneumococcal polysaccharide (PPSV23) vaccine.** / 1 dose for all adults aged 65 years and older.   

## 2014-07-16 LAB — BASIC METABOLIC PANEL WITH GFR
BUN: 11 mg/dL (ref 6–23)
CALCIUM: 9.3 mg/dL (ref 8.4–10.5)
CHLORIDE: 103 meq/L (ref 96–112)
CO2: 27 mEq/L (ref 19–32)
Creat: 0.79 mg/dL (ref 0.50–1.10)
GFR, Est African American: 88 mL/min
GFR, Est Non African American: 76 mL/min
Glucose, Bld: 79 mg/dL (ref 70–99)
Potassium: 3.8 mEq/L (ref 3.5–5.3)
Sodium: 139 mEq/L (ref 135–145)

## 2014-07-16 LAB — HEMOGLOBIN A1C
Hgb A1c MFr Bld: 6.2 % — ABNORMAL HIGH (ref <5.7)
Mean Plasma Glucose: 131 mg/dL — ABNORMAL HIGH (ref <117)

## 2014-07-16 LAB — HEPATIC FUNCTION PANEL
ALBUMIN: 4.1 g/dL (ref 3.5–5.2)
ALK PHOS: 80 U/L (ref 39–117)
ALT: 26 U/L (ref 0–35)
AST: 43 U/L — AB (ref 0–37)
Bilirubin, Direct: 0.1 mg/dL (ref 0.0–0.3)
Indirect Bilirubin: 0.4 mg/dL (ref 0.2–1.2)
Total Bilirubin: 0.5 mg/dL (ref 0.2–1.2)
Total Protein: 6.5 g/dL (ref 6.0–8.3)

## 2014-07-16 LAB — LIPID PANEL
Cholesterol: 215 mg/dL — ABNORMAL HIGH (ref 0–200)
HDL: 38 mg/dL — ABNORMAL LOW (ref >39)
LDL CALC: 153 mg/dL — AB (ref 0–99)
Total CHOL/HDL Ratio: 5.7 Ratio
Triglycerides: 118 mg/dL (ref <150)
VLDL: 24 mg/dL (ref 0–40)

## 2014-07-16 LAB — URINALYSIS, MICROSCOPIC ONLY
BACTERIA UA: NONE SEEN
CASTS: NONE SEEN
Crystals: NONE SEEN
Squamous Epithelial / LPF: NONE SEEN

## 2014-07-16 LAB — INSULIN, FASTING: INSULIN FASTING, SERUM: 2.3 u[IU]/mL (ref 2.0–19.6)

## 2014-07-16 LAB — MICROALBUMIN / CREATININE URINE RATIO
Creatinine, Urine: 61.5 mg/dL
MICROALB UR: 0.7 mg/dL (ref <2.0)
Microalb Creat Ratio: 11.4 mg/g (ref 0.0–30.0)

## 2014-07-16 LAB — VITAMIN D 25 HYDROXY (VIT D DEFICIENCY, FRACTURES): Vit D, 25-Hydroxy: 79 ng/mL (ref 30–89)

## 2014-07-16 LAB — MAGNESIUM: Magnesium: 1.9 mg/dL (ref 1.5–2.5)

## 2014-07-16 LAB — TSH: TSH: 1.63 u[IU]/mL (ref 0.350–4.500)

## 2014-07-31 ENCOUNTER — Other Ambulatory Visit (INDEPENDENT_AMBULATORY_CARE_PROVIDER_SITE_OTHER): Payer: Medicare Other | Admitting: *Deleted

## 2014-07-31 DIAGNOSIS — Z1212 Encounter for screening for malignant neoplasm of rectum: Secondary | ICD-10-CM

## 2014-07-31 LAB — POC HEMOCCULT BLD/STL (HOME/3-CARD/SCREEN)
Card #2 Fecal Occult Blod, POC: NEGATIVE
FECAL OCCULT BLD: NEGATIVE
Fecal Occult Blood, POC: NEGATIVE

## 2014-08-16 ENCOUNTER — Other Ambulatory Visit: Payer: Self-pay | Admitting: Internal Medicine

## 2014-08-16 DIAGNOSIS — Z1231 Encounter for screening mammogram for malignant neoplasm of breast: Secondary | ICD-10-CM

## 2014-08-19 ENCOUNTER — Other Ambulatory Visit: Payer: Self-pay | Admitting: *Deleted

## 2014-08-19 DIAGNOSIS — K589 Irritable bowel syndrome without diarrhea: Secondary | ICD-10-CM

## 2014-08-19 MED ORDER — HYOSCYAMINE SULFATE 0.125 MG PO TABS: ORAL_TABLET | ORAL | Status: DC

## 2014-08-21 ENCOUNTER — Ambulatory Visit (INDEPENDENT_AMBULATORY_CARE_PROVIDER_SITE_OTHER): Payer: Medicare Other | Admitting: Physician Assistant

## 2014-08-21 VITALS — BP 114/72 | HR 60 | Temp 98.1°F | Resp 16 | Ht 63.5 in | Wt 134.4 lb

## 2014-08-21 DIAGNOSIS — K2100 Gastro-esophageal reflux disease with esophagitis, without bleeding: Secondary | ICD-10-CM

## 2014-08-21 DIAGNOSIS — R599 Enlarged lymph nodes, unspecified: Secondary | ICD-10-CM

## 2014-08-21 DIAGNOSIS — R590 Localized enlarged lymph nodes: Secondary | ICD-10-CM

## 2014-08-21 DIAGNOSIS — E785 Hyperlipidemia, unspecified: Secondary | ICD-10-CM

## 2014-08-21 LAB — CBC WITH DIFFERENTIAL/PLATELET
Basophils Absolute: 0.1 10*3/uL (ref 0.0–0.1)
Basophils Relative: 1 % (ref 0–1)
Eosinophils Absolute: 0.3 10*3/uL (ref 0.0–0.7)
Eosinophils Relative: 3 % (ref 0–5)
HEMATOCRIT: 40.8 % (ref 36.0–46.0)
HEMOGLOBIN: 13.6 g/dL (ref 12.0–15.0)
LYMPHS PCT: 48 % — AB (ref 12–46)
Lymphs Abs: 4.4 10*3/uL — ABNORMAL HIGH (ref 0.7–4.0)
MCH: 27.4 pg (ref 26.0–34.0)
MCHC: 33.3 g/dL (ref 30.0–36.0)
MCV: 82.1 fL (ref 78.0–100.0)
MONOS PCT: 7 % (ref 3–12)
MPV: 9.2 fL — ABNORMAL LOW (ref 9.4–12.4)
Monocytes Absolute: 0.6 10*3/uL (ref 0.1–1.0)
NEUTROS ABS: 3.7 10*3/uL (ref 1.7–7.7)
NEUTROS PCT: 41 % — AB (ref 43–77)
Platelets: 237 10*3/uL (ref 150–400)
RBC: 4.97 MIL/uL (ref 3.87–5.11)
RDW: 14.3 % (ref 11.5–15.5)
WBC: 9.1 10*3/uL (ref 4.0–10.5)

## 2014-08-21 LAB — LIPID PANEL
Cholesterol: 189 mg/dL (ref 0–200)
HDL: 34 mg/dL — ABNORMAL LOW (ref >39)
LDL Cholesterol: 133 mg/dL — ABNORMAL HIGH (ref 0–99)
TRIGLYCERIDES: 111 mg/dL (ref <150)
Total CHOL/HDL Ratio: 5.6 Ratio
VLDL: 22 mg/dL (ref 0–40)

## 2014-08-21 MED ORDER — RANITIDINE HCL 300 MG PO TABS: 300.0000 mg | ORAL_TABLET | Freq: Every day | ORAL | Status: DC

## 2014-08-21 NOTE — Progress Notes (Addendum)
Assessment and Plan: 1. Hyperlipidemia Willing to get on medication if needed Continue diet - Lipid panel  2. Gastroesophageal reflux disease with esophagitis Continue PPI for 2 weeks, then take zantac/H2 blocker BID for 2 weeks, then once daily, diet discussed  3. Cervical lymphadenopathy and  Posterior auricular lymphadenopathy - No constitutional symptoms, get [place] see if pathological, if so get CT head/neck and possible biopsy - CBC with Differential - US Soft Tissue Head/Neck; Future  OVER 30 minutes of exam, counseling, chart review, referral performed  Addendum: + abnormal lymph nodes will set up for [facility]   HPI 73 y.o.female with history of HTN, chol, preDM, family history of heart disease presents for follow up for cholesterol. She has been on a cholesterol medication in the past without issues, since her last visit she has been decreasing dairy, rarely eats read meat, chicken or fish. Eats oatmeal in the AM and has increased veggies.  Last visit her cholesterol was  Lab Results  Component Value Date   CHOL 215* 07/15/2014   HDL 38* 07/15/2014   LDLCALC 153* 07/15/2014   TRIG 118 07/15/2014   CHOLHDL 5.7 07/15/2014   She complains of her stomach growling, especially at night, with increased hunger, she has some epigastric discomfort that goes around to her back for last 2-3 weeks. She has been trying to cut back on her protonix, she has decreased it to 40 every other day, then the 20mg , and then one every 2 days, started this process 1 month ago  She also complains of new lumps on her neck and behind her ears for the past month. Nontender, getting larger. .   Past Medical History  Diagnosis Date  . OSA (obstructive sleep apnea)   . Hiatal hernia   . IBS (irritable bowel syndrome)   . Fibrocystic breast disease   . Asthma   . Diverticulosis   . Adenomatous colon polyp 1994  . Internal hemorrhoids   . Melanoma     Facial  . Hyperlipidemia   . Hypertension   . GERD  (gastroesophageal reflux disease)   . Osteoporosis   . Osteoarthritis   . Vitamin D deficiency   . Prediabetes   . Unspecified hypothyroidism   . Family history of ischemic heart disease      Allergies  Allergen Reactions  . Actonel [Risedronate Sodium]   . Fosamax [Alendronate]   . Sulfa Antibiotics   . Tetracyclines & Related       Current Outpatient Prescriptions on File Prior to Visit  Medication Sig Dispense Refill  . albuterol (PROVENTIL HFA;VENTOLIN HFA) 108 (90 BASE) MCG/ACT inhaler Inhale 1-2 puffs into the lungs every 4 (four) hours as needed for wheezing. 1 Inhaler 99  . ALPRAZolam (XANAX) 0.5 MG tablet Take 1/2 to 1 tablet 3 x day  as needed for anxiety or sleep 90 tablet 3  . Cholecalciferol (VITAMIN D) 2000 UNITS CAPS Take 2 capsules by mouth. Patient alternates 1 cap with 2 caps every other day.    . hyoscyamine (LEVSIN, ANASPAZ) 0.125 MG tablet Take 1 to 2 tablets every 4 hours as needed for nausea, cramping, bloating or diarrhea 90 tablet 11  . levothyroxine (SYNTHROID, LEVOTHROID) 50 MCG tablet Take 50 mcg by mouth daily before breakfast.    . Magnesium 200 MG TABS Take 1 tablet by mouth 4 (four) times a week.     . Multiple Vitamins-Minerals (MULTIVITAMIN WITH MINERALS) tablet Take 1 tablet by mouth daily.    . pantoprazole (PROTONIX)  40 MG tablet Take 1 tablet (40 mg total) by mouth daily. 90 tablet 3  . QVAR 80 MCG/ACT inhaler INHALE 2 PUFFS INTO THE LUNGS 2 TIMES DAILY. 8.7 g 6  . Zinc 50 MG TABS Take 25 mg by mouth daily.     No current facility-administered medications on file prior to visit.    ROS:  Review of Systems  Constitutional: Positive for malaise/fatigue and diaphoresis (hot flashes day or night). Negative for fever, chills and weight loss.  HENT: Negative.        Lumps on neck, behind ears. No symptoms.  No night sweat, weight loss.   Eyes: Negative.   Respiratory: Negative.   Cardiovascular: Negative.   Gastrointestinal: Positive for  heartburn, nausea and abdominal pain. Negative for vomiting, diarrhea, constipation, blood in stool and melena.       + IBS  Genitourinary: Negative.   Musculoskeletal: Negative.   Skin: Negative.   Neurological: Positive for tremors. Negative for dizziness, tingling, sensory change, speech change, focal weakness, seizures, loss of consciousness and weakness.  Endo/Heme/Allergies: Negative.  Does not bruise/bleed easily.  Psychiatric/Behavioral: Negative.     Physical Exam: Filed Weights   08/21/14 1221  Weight: 134 lb 6.4 oz (60.963 kg)   BP 114/72 mmHg  Pulse 60  Temp(Src) 98.1 F (36.7 C)  Resp 16  Ht 5' 3.5" (1.613 m)  Wt 134 lb 6.4 oz (60.963 kg)  BMI 23.43 kg/m2 Physical Exam  Constitutional: She is oriented to person, place, and time. She appears well-developed.  HENT:  Head: Normocephalic and atraumatic.  Nose: Nose normal.  Mouth/Throat: No oropharyngeal exudate.  Eyes: EOM are normal. Pupils are equal, round, and reactive to light.  Neck: Trachea normal and normal range of motion. No thyroid mass and no thyromegaly present.  Cardiovascular: Normal rate and regular rhythm.   Pulmonary/Chest: Effort normal and breath sounds normal.  Abdominal: Soft. Bowel sounds are normal. There is no hepatosplenomegaly. There is tenderness in the epigastric area. There is no rigidity, no rebound and no guarding.  Musculoskeletal: Normal range of motion.  Lymphadenopathy:       Head (right side): Posterior auricular adenopathy present. No submental, no submandibular, no tonsillar, no preauricular and no occipital adenopathy present.       Head (left side): Posterior auricular and occipital adenopathy present. No submental, no submandibular, no tonsillar and no preauricular adenopathy present.    She has cervical adenopathy.       Right cervical: Superficial cervical adenopathy present. No deep cervical and no posterior cervical adenopathy present.      Left cervical: Superficial  cervical adenopathy present. No deep cervical and no posterior cervical adenopathy present.    She has no axillary adenopathy.       Right: No inguinal adenopathy present.       Left: No inguinal adenopathy present.  Neurological: She is alert and oriented to person, place, and time.  + essential tremor  Skin: Skin is warm and dry. No rash noted.  Psychiatric: She has a normal mood and affect. Her behavior is normal.     Vicie Mutters, PA-C 12:45 PM [facility]

## 2014-08-21 NOTE — Progress Notes (Signed)
Scheduled for 08-23-14 at 1:45. Patient aware of appointment

## 2014-08-21 NOTE — Patient Instructions (Signed)
Take the protonix for 2 more weeks once a day, then switch to pepcid or zantac (generic is fine) 2 x a day for 2 weeks, then once a day for 2 weeks and then stop. Avoid alcohol, spicy foods, NSAIDS (aleve, ibuprofen) at this time. See foods below.   Food Choices for Gastroesophageal Reflux Disease When you have gastroesophageal reflux disease (GERD), the foods you eat and your eating habits are very important. Choosing the right foods can help ease the discomfort of GERD. WHAT GENERAL GUIDELINES DO I NEED TO FOLLOW?  Choose fruits, vegetables, whole grains, low-fat dairy products, and low-fat meat, fish, and poultry.  Limit fats such as oils, salad dressings, butter, nuts, and avocado.  Keep a food diary to identify foods that cause symptoms.  Avoid foods that cause reflux. These may be different for different people.  Eat frequent small meals instead of three large meals each day.  Eat your meals slowly, in a relaxed setting.  Limit fried foods.  Cook foods using methods other than frying.  Avoid drinking alcohol.  Avoid drinking large amounts of liquids with your meals.  Avoid bending over or lying down until 2-3 hours after eating. WHAT FOODS ARE NOT RECOMMENDED? The following are some foods and drinks that may worsen your symptoms: Vegetables Tomatoes. Tomato juice. Tomato and spaghetti sauce. Chili peppers. Onion and garlic. Horseradish. Fruits Oranges, grapefruit, and lemon (fruit and juice). Meats High-fat meats, fish, and poultry. This includes hot dogs, ribs, ham, sausage, salami, and bacon. Dairy Whole milk and chocolate milk. Sour cream. Cream. Butter. Ice cream. Cream cheese.  Beverages Coffee and tea, with or without caffeine. Carbonated beverages or energy drinks. Condiments Hot sauce. Barbecue sauce.  Sweets/Desserts Chocolate and cocoa. Donuts. Peppermint and spearmint. Fats and Oils High-fat foods, including [place] fries and potato chips. Other Vinegar.  Strong spices, such as black pepper, white pepper, red pepper, cayenne, curry powder, cloves, ginger, and chili powder.

## 2014-08-23 ENCOUNTER — Ambulatory Visit (HOSPITAL_COMMUNITY)
Admission: RE | Admit: 2014-08-23 | Discharge: 2014-08-23 | Disposition: A | Discharge status: Home / Self Care | Payer: Medicare Other | Source: Ambulatory Visit | Attending: Physician Assistant | Admitting: Physician Assistant

## 2014-08-23 DIAGNOSIS — R590 Localized enlarged lymph nodes: Secondary | ICD-10-CM | POA: Diagnosis not present

## 2014-08-25 NOTE — Addendum Note (Signed)
Addended by: Vicie Mutters R on: 08/25/2014 07:51 PM   Modules accepted: Miquel Dunn

## 2014-08-26 ENCOUNTER — Telehealth: Payer: Self-pay | Admitting: Pulmonary Disease

## 2014-08-26 NOTE — Telephone Encounter (Signed)
Please let pt know that her download shows good control of her sleep apnea.

## 2014-08-26 NOTE — Addendum Note (Signed)
Addended by: Vicie Mutters R on: 08/26/2014 02:33 PM   Modules accepted: Orders, SmartSet

## 2014-08-26 NOTE — Telephone Encounter (Signed)
lmtcb x1 

## 2014-08-26 NOTE — Telephone Encounter (Signed)
Pt call back and spoke with Ria Comment. Pt verbalized understanding of results and had no questions

## 2014-09-03 ENCOUNTER — Other Ambulatory Visit: Payer: Self-pay

## 2014-09-10 ENCOUNTER — Other Ambulatory Visit: Payer: Self-pay

## 2014-09-10 ENCOUNTER — Encounter: Payer: Self-pay | Admitting: Physician Assistant

## 2014-09-10 ENCOUNTER — Other Ambulatory Visit: Payer: Self-pay | Admitting: Physician Assistant

## 2014-09-10 ENCOUNTER — Ambulatory Visit (INDEPENDENT_AMBULATORY_CARE_PROVIDER_SITE_OTHER): Payer: Medicare Other | Admitting: Physician Assistant

## 2014-09-10 VITALS — BP 122/72 | HR 60 | Temp 98.6°F | Resp 16 | Ht 63.5 in | Wt 133.0 lb

## 2014-09-10 DIAGNOSIS — R109 Unspecified abdominal pain: Secondary | ICD-10-CM

## 2014-09-10 DIAGNOSIS — R1011 Right upper quadrant pain: Secondary | ICD-10-CM

## 2014-09-10 DIAGNOSIS — K2100 Gastro-esophageal reflux disease with esophagitis, without bleeding: Secondary | ICD-10-CM

## 2014-09-10 DIAGNOSIS — R590 Localized enlarged lymph nodes: Secondary | ICD-10-CM

## 2014-09-10 DIAGNOSIS — R140 Abdominal distension (gaseous): Secondary | ICD-10-CM

## 2014-09-10 LAB — COMPREHENSIVE METABOLIC PANEL
ALK PHOS: 83 U/L (ref 39–117)
ALT: 23 U/L (ref 0–35)
AST: 39 U/L — AB (ref 0–37)
Albumin: 3.9 g/dL (ref 3.5–5.2)
BILIRUBIN TOTAL: 0.5 mg/dL (ref 0.2–1.2)
BUN: 13 mg/dL (ref 6–23)
CHLORIDE: 102 meq/L (ref 96–112)
CO2: 27 mEq/L (ref 19–32)
Calcium: 9.4 mg/dL (ref 8.4–10.5)
Creat: 0.84 mg/dL (ref 0.50–1.10)
GLUCOSE: 84 mg/dL (ref 70–99)
Potassium: 4.3 mEq/L (ref 3.5–5.3)
Sodium: 138 mEq/L (ref 135–145)
TOTAL PROTEIN: 6.5 g/dL (ref 6.0–8.3)

## 2014-09-10 LAB — LIPASE: Lipase: 40 U/L (ref 0–75)

## 2014-09-10 MED ORDER — CIPROFLOXACIN HCL 500 MG PO TABS: 500.0000 mg | ORAL_TABLET | Freq: Two times a day (BID) | ORAL | Status: DC

## 2014-09-10 NOTE — Progress Notes (Addendum)
Subjective:    Patient ID: Shannon Parsons, female    DOB: 07-03-1941, 73 y.o.   MRN: [id number]  HPI 73 y.o. white female presents for follow up. She was seen 08/21/2014 and had enlarged cervical lymph nodes and posterior auricular lymphnodes which are still enlarged and nontender. She is pending a biopsy since US showed, and we will repeat CBC, she denies ear pain, sinus symptoms, headache. She does have fatigue, denies night sweats:   IMPRESSION: Bilateral cervical lymphadenopathy with abnormal renal cortical hypo echogenicity and cortical thickening. This may be seen with infectious/ inflammatory etiologies, or hematologic abnormality/ malignancy. A representative lymph node would be technically accessible if ultrasound-guided sampling is desired for further information.  For 2 weeks she has been having abdominal bloating, stating it looks like she is 6 months pregnant. She has been taking protonix, levsin which is not helping. She has been decreasing her meals. She has chronic epigastric discomfort. She continue to have stomach growling and increased hunger. She had EGD in August with Dr. Fuller Plan and had colonoscopy in 2012, due 2017. Had CT AB in 12/2013. That showed small stable liver cyst, and she states she did have a cousin with ovarian cancer and would like a CA 125, we discussed how this is not diagnostic and she understands.    Review of Systems  Constitutional: Positive for appetite change and fatigue. Negative for fever, chills, diaphoresis, activity change and unexpected weight change.  HENT: Negative.  Negative for congestion, dental problem, ear discharge, ear pain, facial swelling, hearing loss, mouth sores, nosebleeds, postnasal drip, rhinorrhea, sinus pressure, sneezing, sore throat, tinnitus, trouble swallowing and voice change.   Respiratory: Negative.   Cardiovascular: Negative.   Gastrointestinal: Positive for nausea, abdominal pain and abdominal distention. Negative  for vomiting, diarrhea, constipation, blood in stool, anal bleeding and rectal pain.  Genitourinary: Negative.   Musculoskeletal: Negative.   Skin: Negative.   Neurological: Negative.   Hematological: Negative.   Psychiatric/Behavioral: Negative.        Objective:   Physical Exam  Constitutional: She is oriented to person, place, and time. She appears well-developed.  HENT:  Head: Normocephalic and atraumatic.  Nose: Nose normal.  Mouth/Throat: No oropharyngeal exudate.  Eyes: EOM are normal. Pupils are equal, round, and reactive to light.  Neck: Trachea normal and normal range of motion. No thyroid mass and no thyromegaly present.  Cardiovascular: Normal rate and regular rhythm.   Pulmonary/Chest: Effort normal and breath sounds normal.  Abdominal: Soft. Bowel sounds are normal. She exhibits distension (lower AB, with right tenderness). There is no hepatosplenomegaly. There is tenderness in the epigastric area. There is no rigidity, no rebound and no guarding.  Musculoskeletal: Normal range of motion.  Lymphadenopathy:       Head (right side): Posterior auricular adenopathy present. No submental, no submandibular, no tonsillar, no preauricular and no occipital adenopathy present.       Head (left side): Posterior auricular and occipital adenopathy present. No submental, no submandibular, no tonsillar and no preauricular adenopathy present.    She has cervical adenopathy.       Right cervical: Superficial cervical adenopathy present. No deep cervical and no posterior cervical adenopathy present.      Left cervical: Superficial cervical adenopathy present. No deep cervical and no posterior cervical adenopathy present.    She has no axillary adenopathy.       Right: No inguinal adenopathy present.       Left: No inguinal adenopathy present.  Neurological:  She is alert and oriented to person, place, and time.  + essential tremor  Skin: Skin is warm and dry. No rash noted.   Psychiatric: She has a normal mood and affect. Her behavior is normal.        Assessment & Plan:  Cervical Adenopathy- get biopsy, recheck CBC, CMET Abdominal bloating/pain- get US pelvis/AB, rule out ovarian cancer with family history- follow up with Dr. Fuller Plan, will do trial of Cipro, will get celiac panel.   Addendum: Per Dr. Shon Baton, requesting contrast enhanced neck CT prior to biopsy to facilitate biopsy and to rule out mass/CA.

## 2014-09-10 NOTE — Addendum Note (Signed)
Addended by: Vicie Mutters R on: 09/10/2014 04:30 PM   Modules accepted: Orders

## 2014-09-10 NOTE — Patient Instructions (Signed)

## 2014-09-11 ENCOUNTER — Ambulatory Visit (HOSPITAL_COMMUNITY): Payer: Medicare Other

## 2014-09-11 ENCOUNTER — Telehealth: Payer: Self-pay | Admitting: Gastroenterology

## 2014-09-11 LAB — CBC WITH DIFFERENTIAL/PLATELET
BASOS ABS: 0.1 10*3/uL (ref 0.0–0.1)
BASOS PCT: 1 % (ref 0–1)
EOS PCT: 4 % (ref 0–5)
Eosinophils Absolute: 0.4 10*3/uL (ref 0.0–0.7)
HCT: 39.6 % (ref 36.0–46.0)
Hemoglobin: 12.9 g/dL (ref 12.0–15.0)
Lymphocytes Relative: 54 % — ABNORMAL HIGH (ref 12–46)
Lymphs Abs: 6 10*3/uL — ABNORMAL HIGH (ref 0.7–4.0)
MCH: 27 pg (ref 26.0–34.0)
MCHC: 32.6 g/dL (ref 30.0–36.0)
MCV: 82.8 fL (ref 78.0–100.0)
MPV: 9.1 fL (ref 8.6–12.4)
Monocytes Absolute: 0.8 10*3/uL (ref 0.1–1.0)
Monocytes Relative: 7 % (ref 3–12)
NEUTROS ABS: 3.8 10*3/uL (ref 1.7–7.7)
Neutrophils Relative %: 34 % — ABNORMAL LOW (ref 43–77)
Platelets: 251 10*3/uL (ref 150–400)
RBC: 4.78 MIL/uL (ref 3.87–5.11)
RDW: 14.5 % (ref 11.5–15.5)
WBC: 11.1 10*3/uL — AB (ref 4.0–10.5)

## 2014-09-11 LAB — CELIAC PANEL 10
Endomysial Screen: NEGATIVE
GLIADIN IGA: 3 U (ref <20)
GLIADIN IGG: 2 U (ref <20)
IGA: 137 mg/dL (ref 69–380)
Tissue Transglut Ab: 2 U/mL (ref <6)
Tissue Transglutaminase Ab, IgA: 1 U/mL (ref <4)

## 2014-09-11 NOTE — Telephone Encounter (Signed)
Spoke with the patient. She was seen by PCP and work up has been started. Appointment scheduled with Nevin Bloodgood  For 09/18/14.

## 2014-09-12 ENCOUNTER — Ambulatory Visit: Payer: Self-pay | Admitting: Physician Assistant

## 2014-09-16 ENCOUNTER — Ambulatory Visit
Admission: RE | Admit: 2014-09-16 | Discharge: 2014-09-16 | Disposition: A | Discharge status: Home / Self Care | Payer: Medicare Other | Source: Ambulatory Visit | Attending: Physician Assistant | Admitting: Physician Assistant

## 2014-09-16 DIAGNOSIS — R590 Localized enlarged lymph nodes: Secondary | ICD-10-CM

## 2014-09-16 MED ORDER — IOHEXOL 300 MG/ML  SOLN
75.0000 mL | Freq: Once | INTRAMUSCULAR | Status: AC | PRN
  Administered 2014-09-16: 75 mL via INTRAVENOUS

## 2014-09-17 ENCOUNTER — Ambulatory Visit
Admission: RE | Admit: 2014-09-17 | Discharge: 2014-09-17 | Disposition: A | Discharge status: Home / Self Care | Payer: Medicare Other | Source: Ambulatory Visit | Attending: Physician Assistant | Admitting: Physician Assistant

## 2014-09-17 ENCOUNTER — Telehealth: Payer: Self-pay | Admitting: Physician Assistant

## 2014-09-17 ENCOUNTER — Other Ambulatory Visit: Payer: Self-pay | Admitting: Physician Assistant

## 2014-09-17 DIAGNOSIS — R109 Unspecified abdominal pain: Secondary | ICD-10-CM

## 2014-09-17 DIAGNOSIS — R1011 Right upper quadrant pain: Secondary | ICD-10-CM

## 2014-09-17 DIAGNOSIS — R1013 Epigastric pain: Secondary | ICD-10-CM

## 2014-09-17 DIAGNOSIS — R935 Abnormal findings on diagnostic imaging of other abdominal regions, including retroperitoneum: Secondary | ICD-10-CM

## 2014-09-17 NOTE — Telephone Encounter (Signed)
Patient with recent cervical lymphadenopathy and epigastric pain, had US showing new splenomegaly and two new masses near pancreatic head from CT AB 12/2013. She has an appointment with Dr. Fuller Plan 's NP/PA tomorrow for epigastric pain. We will schedule for CT AB with/without contrast and radiology will contact patient for biopsy, need to rule out lymphoma/cancer. Long discussion with the patient, she understands.

## 2014-09-18 ENCOUNTER — Encounter: Payer: Self-pay | Admitting: Nurse Practitioner

## 2014-09-18 ENCOUNTER — Ambulatory Visit (INDEPENDENT_AMBULATORY_CARE_PROVIDER_SITE_OTHER): Payer: Medicare Other | Admitting: Nurse Practitioner

## 2014-09-18 VITALS — BP 128/68 | HR 64 | Ht 63.5 in | Wt 135.0 lb

## 2014-09-18 DIAGNOSIS — R140 Abdominal distension (gaseous): Secondary | ICD-10-CM

## 2014-09-18 DIAGNOSIS — R933 Abnormal findings on diagnostic imaging of other parts of digestive tract: Secondary | ICD-10-CM

## 2014-09-18 DIAGNOSIS — R1013 Epigastric pain: Secondary | ICD-10-CM

## 2014-09-18 NOTE — Progress Notes (Signed)
History of Present Illness:  Patient is a 74 year old female known to Dr. Fuller Plan for history of adenomatous colon polyps and IBS  I saw her August 2015 for epigastric/chest pain. I ordered  ultrasound and labs which were unremarkable.  For further evaluation of pain patient underwent EGD with findings of a small hiatal hernia, erosive gastritis and a stricture at the GE junction.  Patient was started on Protonix, her symptoms resolved. Then in December she had recurrent upper abdominal pain/chest pain. She began to have lower discomfort associated with bloating. Patient has s a long-standing history of irritable bowel syndrome. Bloating is commom for her but it has always responded to levsin. Pelvic ultrasound a few days ago was unremarkable. Patient saw her primary care physician early December at which time she complained of  enlarged lymph nodes in her neck. Ultrasound of the neck showed bilateral cervical lymphadenopathy. Follow-up CT scan of the neck showed extensive adenopathy bilaterally. There was a pretracheal lymph node, enlarged compared with prior exams. She'll be going for a biopsy in the near future.   For the abdominal bloating PCP ordered repeat ultrasound which this time showed two pancreatic masses. CTscan scheduled for this Friday.   Current Medications, Allergies, Past Medical History, Past Surgical History, Family History and Social History were reviewed in [employer] record.  Studies:   Ct Soft Tissue Neck W Contrast  09/16/2014   CLINICAL DATA:  Cervical adenopathy.  Right-sided neck pain.  EXAM: CT NECK WITH CONTRAST  TECHNIQUE: Multidetector CT imaging of the neck was performed using the standard protocol following the bolus administration of intravenous contrast.  CONTRAST:  83mL OMNIPAQUE IOHEXOL 300 MG/ML  SOLN  COMPARISON:  Ultrasound of August 23, 2014. CT scan of chest of Feb 06, 2013.  FINDINGS: Pharynx and larynx: Epiglottis appears normal.  Airway is patent. Larynx appears normal.  Salivary glands: No definite salivary abnormality seen.  Thyroid: Normal.  Lymph nodes: Extensive adenopathy is seen involving the level 1, 2, 3 and 4 regions bilaterally. Largest lymph node measures 17 x 10 mm in the right submandibular region. 20 x 8 mm lymph node is noted in left occipital region.  Vascular: Visualized vasculature appears normal.  Limited intracranial: No definite abnormality visualized.  Mastoids and visualized paranasal sinuses: Mucosal thickening is noted in the inferior portions of both maxillary sinuses, with left greater than right.  Skeleton: Severe degenerative disc disease is noted at C5-6 and C6-7.  Upper chest: No significant abnormality seen involving the lung parenchyma of the visualize upper chest. 16 x 12 mm pretracheal lymph node is noted which is enlarged compared to prior exam.  IMPRESSION: Extensive bilateral cervical adenopathy is noted, as well as enlarged pretracheal lymph node seen in superior mediastinum which is increased compared to prior exam. While this may simply be inflammatory in origin, neoplasm or malignancy cannot be excluded and tissue sampling is recommended.   Electronically Signed   By: Sabino Dick M.D.   On: 09/16/2014 09:19   US Soft Tissue Head/neck  08/23/2014   CLINICAL DATA:  Posterior auricular palpable masses and bilateral cervical lymphadenopathy  EXAM: ULTRASOUND OF HEAD/NECK SOFT TISSUES  TECHNIQUE: Ultrasound examination of the head and neck soft tissues was performed in the area of clinical concern.  COMPARISON:  None.  FINDINGS: The soft tissue structures along the expected locations of both cervical chains were evaluated with grayscale and color flow imaging. There are multiple bilateral cervical chain lymph nodes with abnormally  hypoechoic cortices and diffuse cortical thickening and abnormal internal hypervascularity. Representative dominant left cervical node measures 0.6 cm in short axis.  Representative right cervical chain node measures 0.7 cm in short axis.  IMPRESSION: Bilateral cervical lymphadenopathy with abnormal renal cortical hypo echogenicity and cortical thickening. This may be seen with infectious/ inflammatory etiologies, or hematologic abnormality/ malignancy. A representative lymph node would be technically accessible if ultrasound-guided sampling is desired for further information.   Electronically Signed   By: Conchita Paris M.D.   On: 08/23/2014 15:10   US Abdomen Complete  09/17/2014   CLINICAL DATA:  Abdominal pain  EXAM: ULTRASOUND ABDOMEN COMPLETE  COMPARISON:  CT abdomen 01/01/2014, ultrasound 12/03/2009  FINDINGS: Gallbladder: No gallstones or wall thickening visualized. No sonographic Murphy sign noted.  Common bile duct: Diameter: 3.7 mm  Liver: 8 mm cyst left lobe.  No liver lesion.  IVC: No abnormality visualized.  Pancreas: 2 soft tissue masses are seen in the region of the body of the pancreas and head of the pancreas. The mass in the body the pancreas measures 16 x 8 x 17 mm. The mass in the head of the pancreas measures 2.2 x 1.1 x 2.4 cm. Possible lymph nodes. These are not identified on the prior CT.  Spleen: Splenomegaly with a splenic volume 501 mL. Spleen was not enlarged on the prior CT.  Right Kidney: Length: 9.8 cm. Echogenicity within normal limits. No mass or hydronephrosis visualized.  Left Kidney: Length: 9.7 cm. Echogenicity within normal limits. No mass or hydronephrosis visualized.  Abdominal aorta: No aneurysm visualized.  Other findings: None.  IMPRESSION: Negative for gallstones.  Splenomegaly  16 mm and 22 mm soft tissue masses in the region of the pancreas, favor adenopathy. CT abdomen with oral and IV contrast suggested further evaluation to exclude underlying pancreatic mass.   Electronically Signed   By: Franchot Gallo M.D.   On: 09/17/2014 09:39   US Transvaginal Non-ob  09/17/2014   CLINICAL DATA:  Right lower quadrant and epigastric pain.  Abdominal bloating.  EXAM: TRANSABDOMINAL AND TRANSVAGINAL ULTRASOUND OF PELVIS  TECHNIQUE: Both transabdominal and transvaginal ultrasound examinations of the pelvis were performed. Transabdominal technique was performed for global imaging of the pelvis including uterus, ovaries, adnexal regions, and pelvic cul-de-sac. It was necessary to proceed with endovaginal exam following the transabdominal exam to visualize the endometrium and right ovary.  COMPARISON:  None  FINDINGS: Uterus  Measurements: 4.0 x 1.5 x 2.6 cm. No fibroids or other mass visualized.  Endometrium  Thickness: 7 mm.  No focal abnormality visualized.  Right ovary  Measurements: Not visualized.  No adnexal masses seen.  Left ovary  Measurements: 3.0 x 1.5 x 1.8 cm. Normal appearance/no adnexal mass.  Other findings  No free fluid.  IMPRESSION: Unremarkable pelvic ultrasound. Right ovary not visualized, but no adnexal mass seen.   Electronically Signed   By: Rolm Baptise M.D.   On: 09/17/2014 11:35     Physical Exam: General: Pleasant, well developed female in no acute distress Head: Normocephalic and atraumatic Eyes:  sclerae anicteric, conjunctiva pink  Ears: Normal auditory acuity Lungs: Clear throughout to auscultation Heart: Regular rate and rhythm Abdomen: Soft, mildly distended, non-tender. No masses, no hepatomegaly. Normal bowel sounds Musculoskeletal: Symmetrical with no gross deformities  Extremities: No edema  Neurological: Alert oriented x 4, grossly nonfocal Psychological:  Alert and cooperative. Normal mood and affect  Assessment and Recommendations:  74 year old female with recurrent epigastric / chest pain. Unrevealing EGD  and ultrasound a few months back. Now with recent ultrasound revealing two pancreatic lesions favored to be lymph nodes. Additionally, patient undergoing workup for bilateral cervical adenopathy. For CTscan of abd/pelvis in two days. Will see what CTscan shows (?lymphoma). [name]

## 2014-09-18 NOTE — Patient Instructions (Signed)
Once your CT Scan is resulted and we can read it in your chart we will call you.

## 2014-09-19 NOTE — Progress Notes (Signed)
Reviewed and agree with management plan.  Jaylynne Birkhead T. Anitra Doxtater, MD FACG 

## 2014-09-20 ENCOUNTER — Telehealth: Payer: Self-pay | Admitting: Nurse Practitioner

## 2014-09-20 ENCOUNTER — Ambulatory Visit
Admission: RE | Admit: 2014-09-20 | Discharge: 2014-09-20 | Disposition: A | Discharge status: Home / Self Care | Payer: Medicare Other | Source: Ambulatory Visit | Attending: Physician Assistant | Admitting: Physician Assistant

## 2014-09-20 DIAGNOSIS — R935 Abnormal findings on diagnostic imaging of other abdominal regions, including retroperitoneum: Secondary | ICD-10-CM

## 2014-09-20 DIAGNOSIS — R1013 Epigastric pain: Secondary | ICD-10-CM

## 2014-09-20 MED ORDER — IOHEXOL 300 MG/ML  SOLN
100.0000 mL | Freq: Once | INTRAMUSCULAR | Status: AC | PRN
  Administered 2014-09-20: 100 mL via INTRAVENOUS

## 2014-09-20 NOTE — Telephone Encounter (Signed)
I called patient today, she asked me to call with CTscan results. PCP ordered the scan for evaluation of pancreatic lesions seen on ultrasound but their office closed at noon today. Told patient that no pancreatic masses were seen but that this could be lymphoma which is a more favorable diagnosis. She was pleased that there were no obvious pancreatic masses. She will call her PCP on Monday for details regarding evaluation /  treatment. Nothing on CTscan to account for her bloating.  Celiac studies negative. Patient will call us back if symptoms persist.   Tye Savoy, NP  Cc: Dr. Randon Goldsmith, P.A-C (PCP)

## 2014-09-23 ENCOUNTER — Telehealth: Payer: Self-pay | Admitting: Physician Assistant

## 2014-09-23 DIAGNOSIS — C8590 Non-Hodgkin lymphoma, unspecified, unspecified site: Secondary | ICD-10-CM

## 2014-09-23 NOTE — Telephone Encounter (Signed)
Called patient to discuss CT results. We are going to proceed with the cervical lymph node biopsy and put in a referral to oncology. Patient is still complaining of right chest pain/rib pain, denies shortness of breath, nonexertional. If she gets accompaniments or it does not go away will go to ER.

## 2014-09-26 ENCOUNTER — Other Ambulatory Visit: Payer: Self-pay | Admitting: Radiology

## 2014-09-30 ENCOUNTER — Ambulatory Visit (HOSPITAL_COMMUNITY)
Admission: RE | Admit: 2014-09-30 | Discharge: 2014-09-30 | Disposition: A | Discharge status: Home / Self Care | Payer: Medicare Other | Source: Ambulatory Visit | Attending: Physician Assistant | Admitting: Physician Assistant

## 2014-09-30 ENCOUNTER — Encounter (HOSPITAL_COMMUNITY): Payer: Self-pay

## 2014-09-30 DIAGNOSIS — R590 Localized enlarged lymph nodes: Secondary | ICD-10-CM | POA: Diagnosis not present

## 2014-09-30 LAB — PROTIME-INR
INR: 1.07 (ref 0.00–1.49)
Prothrombin Time: 14 seconds (ref 11.6–15.2)

## 2014-09-30 LAB — APTT: aPTT: 25 seconds (ref 24–37)

## 2014-09-30 LAB — CBC
HEMATOCRIT: 44 % (ref 36.0–46.0)
Hemoglobin: 14.3 g/dL (ref 12.0–15.0)
MCH: 27.7 pg (ref 26.0–34.0)
MCHC: 32.5 g/dL (ref 30.0–36.0)
MCV: 85.3 fL (ref 78.0–100.0)
Platelets: 213 10*3/uL (ref 150–400)
RBC: 5.16 MIL/uL — AB (ref 3.87–5.11)
RDW: 14.1 % (ref 11.5–15.5)
WBC: 12.3 10*3/uL — ABNORMAL HIGH (ref 4.0–10.5)

## 2014-09-30 MED ORDER — MIDAZOLAM HCL 2 MG/2ML IJ SOLN
INTRAMUSCULAR | Status: AC
  Filled 2014-09-30: qty 6

## 2014-09-30 MED ORDER — SODIUM CHLORIDE 0.9 % IV SOLN
INTRAVENOUS | Status: DC
  Administered 2014-09-30: 12:00:00 via INTRAVENOUS

## 2014-09-30 MED ORDER — HYDROCODONE-ACETAMINOPHEN 5-325 MG PO TABS
1.0000 | ORAL_TABLET | ORAL | Status: DC | PRN
  Filled 2014-09-30: qty 2

## 2014-09-30 MED ORDER — MIDAZOLAM HCL 2 MG/2ML IJ SOLN
INTRAMUSCULAR | Status: AC | PRN
  Administered 2014-09-30 (×2): 1 mg via INTRAVENOUS

## 2014-09-30 MED ORDER — FENTANYL CITRATE 0.05 MG/ML IJ SOLN
INTRAMUSCULAR | Status: AC
  Filled 2014-09-30: qty 4

## 2014-09-30 MED ORDER — FENTANYL CITRATE 0.05 MG/ML IJ SOLN
INTRAMUSCULAR | Status: AC | PRN
  Administered 2014-09-30: 50 ug via INTRAVENOUS
  Administered 2014-09-30: 25 ug via INTRAVENOUS

## 2014-09-30 MED ORDER — FLUMAZENIL 0.5 MG/5ML IV SOLN
INTRAVENOUS | Status: AC
  Filled 2014-09-30: qty 5

## 2014-09-30 MED ORDER — NALOXONE HCL 0.4 MG/ML IJ SOLN
INTRAMUSCULAR | Status: AC
  Filled 2014-09-30: qty 1

## 2014-09-30 NOTE — H&P (Signed)
Chief Complaint: "I am here for a lymph node biopsy."  Referring Physician(s): Collier,Amanda PA-C  History of Present Illness: Shannon Parsons is a 74 y.o. female with c/o abdominal pain worsening in the last 2 months and imaging studies revealed lymphadenopathy. She denies any night sweats or weight loss. She is scheduled today for an ultrasound guided cervical lymph node biopsy with moderate sedation. She denies any chest pain, shortness of breath or palpitations. She denies any active signs of bleeding or excessive bruising. She denies any recent fever or chills. The patient admits to history of sleep apnea and uses a CPAP. She has previously tolerated sedation without complications.    Past Medical History  Diagnosis Date  . OSA (obstructive sleep apnea)   . Hiatal hernia   . IBS (irritable bowel syndrome)   . Fibrocystic breast disease   . Asthma   . Diverticulosis   . Adenomatous colon polyp 1994  . Internal hemorrhoids   . Melanoma     Facial  . Hyperlipidemia   . Hypertension   . GERD (gastroesophageal reflux disease)   . Osteoporosis   . Osteoarthritis   . Vitamin D deficiency   . Prediabetes   . Unspecified hypothyroidism   . Family history of ischemic heart disease     Past Surgical History  Procedure Laterality Date  . Appendectomy    . Tonsillectomy    . Bunionectomy    . Knee arthroscopy    . Umbilical hernia repair      Allergies: Actonel; Fosamax; Sulfa antibiotics; and Tetracyclines & related  Medications: Prior to Admission medications   Medication Sig Start Date End Date Taking? Authorizing Provider  ALPRAZolam Duanne Moron) 0.5 MG tablet Take 1/2 to 1 tablet 3 x day  as needed for anxiety or sleep Patient taking differently: Take 0.5 mg by mouth 3 (three) times daily as needed for anxiety or sleep.  07/15/14 01/13/15 Yes Unk Pinto, MD  Cholecalciferol (VITAMIN D) 2000 UNITS CAPS Take 2,000-4,000 capsules by mouth every other day. Patient  alternates 1 cap with 2 caps every other day.   Yes Historical Provider, MD  hyoscyamine (LEVSIN, ANASPAZ) 0.125 MG tablet Take 1 to 2 tablets every 4 hours as needed for nausea, cramping, bloating or diarrhea 08/19/14  Yes Unk Pinto, MD  levothyroxine (SYNTHROID, LEVOTHROID) 50 MCG tablet Take 50 mcg by mouth daily before breakfast.   Yes Historical Provider, MD  Magnesium 200 MG TABS Take 1 tablet by mouth 4 (four) times a week.    Yes Historical Provider, MD  QVAR 80 MCG/ACT inhaler INHALE 2 PUFFS INTO THE LUNGS 2 TIMES DAILY. 07/08/14  Yes Kathee Delton, MD  ranitidine (ZANTAC) 300 MG tablet Take 1 tablet (300 mg total) by mouth at bedtime. Patient taking differently: Take 300 mg by mouth daily as needed for heartburn.  08/21/14 08/21/15 Yes Vicie Mutters, PA-C  Zinc 50 MG TABS Take 25 mg by mouth daily.   Yes Historical Provider, MD  albuterol (PROVENTIL HFA;VENTOLIN HFA) 108 (90 BASE) MCG/ACT inhaler Inhale 1-2 puffs into the lungs every 4 (four) hours as needed for wheezing. 12/20/13 12/21/14  Unk Pinto, MD  ciprofloxacin (CIPRO) 500 MG tablet Take 1 tablet (500 mg total) by mouth 2 (two) times daily. Patient not taking: Reported on 09/27/2014 09/10/14   Vicie Mutters, PA-C    Family History  Problem Relation Age of Onset  . Emphysema Father   . Heart disease Father   . Heart attack Father 39  Died  . Heart disease Brother   . Rheum arthritis Sister   . Hypertension Mother   . Thyroid disease Mother   . Colon cancer Neg Hx     History   Social History  . Marital Status: Married    Spouse Name: N/A    Number of Children: N/A  . Years of Education: N/A   Occupational History  . retired from office work    Social History Main Topics  . Smoking status: Never Smoker   . Smokeless tobacco: Never Used  . Alcohol Use: No  . Drug Use: No  . Sexual Activity: None   Other Topics Concern  . None   Social History Narrative   Review of Systems: A 12 point ROS  discussed and pertinent positives are indicated in the HPI above.  All other systems are negative.  Review of Systems  Vital Signs: BP 134/67 mmHg  Pulse 77  Temp(Src) 98.8 F (37.1 C) (Oral)  Resp 16  SpO2 98%  Physical Exam  Constitutional: She is oriented to person, place, and time. No distress.  HENT:  Head: Normocephalic and atraumatic.  Neck: No tracheal deviation present.  Cardiovascular: Normal rate and regular rhythm.  Exam reveals no gallop and no friction rub.   No murmur heard. Pulmonary/Chest: Effort normal and breath sounds normal. No respiratory distress. She has no wheezes. She has no rales.  Lymphadenopathy:    She has cervical adenopathy.  Neurological: She is alert and oriented to person, place, and time.  Skin: She is not diaphoretic.  Psychiatric: She has a normal mood and affect. Her behavior is normal. Thought content normal.    Imaging: Ct Soft Tissue Neck W Contrast  09/16/2014   CLINICAL DATA:  Cervical adenopathy.  Right-sided neck pain.  EXAM: CT NECK WITH CONTRAST  TECHNIQUE: Multidetector CT imaging of the neck was performed using the standard protocol following the bolus administration of intravenous contrast.  CONTRAST:  58mL OMNIPAQUE IOHEXOL 300 MG/ML  SOLN  COMPARISON:  Ultrasound of August 23, 2014. CT scan of chest of Feb 06, 2013.  FINDINGS: Pharynx and larynx: Epiglottis appears normal. Airway is patent. Larynx appears normal.  Salivary glands: No definite salivary abnormality seen.  Thyroid: Normal.  Lymph nodes: Extensive adenopathy is seen involving the level 1, 2, 3 and 4 regions bilaterally. Largest lymph node measures 17 x 10 mm in the right submandibular region. 20 x 8 mm lymph node is noted in left occipital region.  Vascular: Visualized vasculature appears normal.  Limited intracranial: No definite abnormality visualized.  Mastoids and visualized paranasal sinuses: Mucosal thickening is noted in the inferior portions of both maxillary  sinuses, with left greater than right.  Skeleton: Severe degenerative disc disease is noted at C5-6 and C6-7.  Upper chest: No significant abnormality seen involving the lung parenchyma of the visualize upper chest. 16 x 12 mm pretracheal lymph node is noted which is enlarged compared to prior exam.  IMPRESSION: Extensive bilateral cervical adenopathy is noted, as well as enlarged pretracheal lymph node seen in superior mediastinum which is increased compared to prior exam. While this may simply be inflammatory in origin, neoplasm or malignancy cannot be excluded and tissue sampling is recommended.   Electronically Signed   By: Sabino Dick M.D.   On: 09/16/2014 09:19   US Abdomen Complete  09/17/2014   CLINICAL DATA:  Abdominal pain  EXAM: ULTRASOUND ABDOMEN COMPLETE  COMPARISON:  CT abdomen 01/01/2014, ultrasound 12/03/2009  FINDINGS: Gallbladder: No gallstones  or wall thickening visualized. No sonographic Murphy sign noted.  Common bile duct: Diameter: 3.7 mm  Liver: 8 mm cyst left lobe.  No liver lesion.  IVC: No abnormality visualized.  Pancreas: 2 soft tissue masses are seen in the region of the body of the pancreas and head of the pancreas. The mass in the body the pancreas measures 16 x 8 x 17 mm. The mass in the head of the pancreas measures 2.2 x 1.1 x 2.4 cm. Possible lymph nodes. These are not identified on the prior CT.  Spleen: Splenomegaly with a splenic volume 501 mL. Spleen was not enlarged on the prior CT.  Right Kidney: Length: 9.8 cm. Echogenicity within normal limits. No mass or hydronephrosis visualized.  Left Kidney: Length: 9.7 cm. Echogenicity within normal limits. No mass or hydronephrosis visualized.  Abdominal aorta: No aneurysm visualized.  Other findings: None.  IMPRESSION: Negative for gallstones.  Splenomegaly  16 mm and 22 mm soft tissue masses in the region of the pancreas, favor adenopathy. CT abdomen with oral and IV contrast suggested further evaluation to exclude underlying  pancreatic mass.   Electronically Signed   By: Franchot Gallo M.D.   On: 09/17/2014 09:39   US Transvaginal Non-ob  09/17/2014   CLINICAL DATA:  Right lower quadrant and epigastric pain. Abdominal bloating.  EXAM: TRANSABDOMINAL AND TRANSVAGINAL ULTRASOUND OF PELVIS  TECHNIQUE: Both transabdominal and transvaginal ultrasound examinations of the pelvis were performed. Transabdominal technique was performed for global imaging of the pelvis including uterus, ovaries, adnexal regions, and pelvic cul-de-sac. It was necessary to proceed with endovaginal exam following the transabdominal exam to visualize the endometrium and right ovary.  COMPARISON:  None  FINDINGS: Uterus  Measurements: 4.0 x 1.5 x 2.6 cm. No fibroids or other mass visualized.  Endometrium  Thickness: 7 mm.  No focal abnormality visualized.  Right ovary  Measurements: Not visualized.  No adnexal masses seen.  Left ovary  Measurements: 3.0 x 1.5 x 1.8 cm. Normal appearance/no adnexal mass.  Other findings  No free fluid.  IMPRESSION: Unremarkable pelvic ultrasound. Right ovary not visualized, but no adnexal mass seen.   Electronically Signed   By: Rolm Baptise M.D.   On: 09/17/2014 11:35   US Pelvis Complete  09/17/2014   CLINICAL DATA:  Right lower quadrant and epigastric pain. Abdominal bloating.  EXAM: TRANSABDOMINAL AND TRANSVAGINAL ULTRASOUND OF PELVIS  TECHNIQUE: Both transabdominal and transvaginal ultrasound examinations of the pelvis were performed. Transabdominal technique was performed for global imaging of the pelvis including uterus, ovaries, adnexal regions, and pelvic cul-de-sac. It was necessary to proceed with endovaginal exam following the transabdominal exam to visualize the endometrium and right ovary.  COMPARISON:  None  FINDINGS: Uterus  Measurements: 4.0 x 1.5 x 2.6 cm. No fibroids or other mass visualized.  Endometrium  Thickness: 7 mm.  No focal abnormality visualized.  Right ovary  Measurements: Not visualized.  No adnexal  masses seen.  Left ovary  Measurements: 3.0 x 1.5 x 1.8 cm. Normal appearance/no adnexal mass.  Other findings  No free fluid.  IMPRESSION: Unremarkable pelvic ultrasound. Right ovary not visualized, but no adnexal mass seen.   Electronically Signed   By: Rolm Baptise M.D.   On: 09/17/2014 11:35   Ct Abd Wo & W Cm  09/20/2014   CLINICAL DATA:  Pancreatic mass seen on ultrasound. Epigastric pain. Lymphadenopathy.  EXAM: CT ABDOMEN WITHOUT AND WITH CONTRAST  TECHNIQUE: Multidetector CT imaging of the abdomen was performed following the standard  protocol before and following the bolus administration of intravenous contrast.  CONTRAST:  151mL OMNIPAQUE IOHEXOL 300 MG/ML  SOLN  COMPARISON:  Multiple exams, including 09/17/2014 and 01/01/2014  FINDINGS: Lower chest: A right lower lobe bulla is observed. Small type 1 hiatal hernia.  Hepatobiliary: Several hypodense lesions are present in the liver and most compatible with cysts, being a fluid density and visible on delayed images. Minimal intrahepatic biliary dilatation in segment 5. Mild steatosis adjacent to the falciform ligament. Gallbladder unremarkable.  Pancreas: Unremarkable  Spleen: Mild splenomegaly, estimated at 420 cc.  Adrenals/Urinary Tract: Bilateral extrarenal collecting system. Mild right hydronephrosis terminating at the UPJ.  Stomach/Bowel: Unremarkable  Vascular/Lymphatic: Extensive retroperitoneal, porta hepatis, peripancreatic, gastrohepatic ligament, and mesenteric adenopathy. Index porta hepatis node 1.5 cm in short axis, image 52 series 5. Index node anterior to the right psoas muscle 1.6 cm in short axis, image 97 series 5.  Aortoiliac atherosclerotic vascular disease.  Musculoskeletal: Chronic bilateral pars defects at L5 with grade 1 anterolisthesis. Central disc protrusion at L1-2 with disc bulges at L2-3 and L3-4.  IMPRESSION: 1. Considerable retroperitoneal, porta hepatis, peripancreatic, gastrohepatic ligament, and mesenteric adenopathy  tracking down into the pelvis. Mild splenomegaly. Appearance favors lymphoma although otherwise occult malignancy could also cause extensive adenopathy. Consider nuclear medicine PET-CT or biopsy. 2. Mild right hydronephrosis suspicious for partial UPJ obstruction. 3. Ancillary findings include small type 1 hiatal hernia, degenerative disc disease, and chronic pars defects at L5 with grade 1 anterolisthesis.   Electronically Signed   By: Sherryl Barters M.D.   On: 09/20/2014 13:14    Labs:  CBC:  Recent Labs  07/15/14 1524 08/21/14 1308 09/10/14 1459 09/30/14 1130  WBC 8.9 9.1 11.1* 12.3*  HGB 13.9 13.6 12.9 14.3  HCT 41.0 40.8 39.6 44.0  PLT 283 237 251 213    COAGS:  Recent Labs  09/30/14 1130  INR 1.07  APTT 25    BMP:  Recent Labs  12/20/13 1450 02/27/14 0536 02/27/14 1845 07/15/14 1524 09/10/14 1459  NA 135 141  --  139 138  K 4.6 3.9  --  3.8 4.3  CL 101 103  --  103 102  CO2 29 26  --  27 27  GLUCOSE 81 137*  --  79 84  BUN 13 15  --  11 13  CALCIUM 9.5 9.7  --  9.3 9.4  CREATININE 0.76 0.84 0.90 0.79 0.84  GFRNONAA 80 69* 63* 76  --   GFRAA >89 80* 73* 88  --     LIVER FUNCTION TESTS:  Recent Labs  02/27/14 0536 04/23/14 1429 07/15/14 1524 09/10/14 1459  BILITOT 0.3 0.9 0.5 0.5  AST 34 25 43* 39*  ALT 17 19 26 23   ALKPHOS 67 55 80 83  PROT 7.2 6.8 6.5 6.5  ALBUMIN 3.7 3.9 4.1 3.9   Assessment and Plan: Abdominal pain x 2 months Cervical lymphadenopathy Scheduled today for image guided cervical lymph node biopsy with moderate sedation Patient has been NPO, no blood thinners taken, labs reviewed Risks and Benefits discussed with the patient. All of the patient's questions were answered, patient is agreeable to proceed. Consent signed and in chart.     SignedHedy Jacob 09/30/2014, 12:20 PM

## 2014-09-30 NOTE — Discharge Instructions (Signed)
Needle Biopsy °Care After °These instructions give you information on caring for yourself after your procedure. Your doctor may also give you more specific instructions. Call your doctor if you have any problems or questions after your procedure. °HOME CARE °· Rest for 4 hours after your biopsy, except for getting up to go to the bathroom or as told. °· Keep the places where the needles were put in clean and dry. °· Do not put powder or lotion on the sites. °· Do not shower until 24 hours after the test. Remove all bandages (dressings) before showering. °· Remove all bandages at least once every day. Gently clean the sites with soap and water. Keep putting a new bandage on until the skin is closed. °Finding out the results of your test °Ask your doctor when your test results will be ready. Make sure you follow up and get the test results. °GET HELP RIGHT AWAY IF:  °· You have shortness of breath or trouble breathing. °· You have pain or cramping in your belly (abdomen). °· You feel sick to your stomach (nauseous) or throw up (vomit). °· Any of the places where the needles were put in: °¨ Are puffy (swollen) or red. °¨ Are sore or hot to the touch. °¨ Are draining yellowish-white fluid (pus). °¨ Are bleeding after 10 minutes of pressing down on the site. Have someone keep pressing on any place that is bleeding until you see a doctor. °· You have any unusual pain that will not stop. °· You have a fever. °If you go to the emergency room, tell the nurse that you had a biopsy. Take this paper with you to show the nurse. °MAKE SURE YOU:  °· Understand these instructions. °· Will watch your condition. °· Will get help right away if you are not doing well or get worse. °Document Released: 08/12/2008 Document Revised: 11/22/2011 Document Reviewed: 08/12/2008 °[employer]® Patient Information ©2015 [employer]. This information is not intended to replace advice given to you by your health care provider. Make sure you discuss  any questions you have with your health care provider. °Conscious Sedation, Adult, Care After °Refer to this sheet in the next few weeks. These instructions provide you with information on caring for yourself after your procedure. Your health care provider may also give you more specific instructions. Your treatment has been planned according to current medical practices, but problems sometimes occur. Call your health care provider if you have any problems or questions after your procedure. °WHAT TO EXPECT AFTER THE PROCEDURE  °After your procedure: °· You may feel sleepy, clumsy, and have poor balance for several hours. °· Vomiting may occur if you eat too soon after the procedure. °HOME CARE INSTRUCTIONS °· Do not participate in any activities where you could become injured for at least 24 hours. Do not: °¨ Drive. °¨ Swim. °¨ Ride a bicycle. °¨ Operate heavy machinery. °¨ Cook. °¨ Use power tools. °¨ Climb ladders. °¨ Work from a high place. °· Do not make important decisions or sign legal documents until you are improved. °· If you vomit, drink water, juice, or soup when you can drink without vomiting. Make sure you have little or no nausea before eating solid foods. °· Only take over-the-counter or prescription medicines for pain, discomfort, or fever as directed by your health care provider. °· Make sure you and your family fully understand everything about the medicines given to you, including what side effects may occur. °· You should not drink alcohol,   take sleeping pills, or take medicines that cause drowsiness for at least 24 hours. °· If you smoke, do not smoke without supervision. °· If you are feeling better, you may resume normal activities 24 hours after you were sedated. °· Keep all appointments with your health care provider. °SEEK MEDICAL CARE IF: °· Your skin is pale or bluish in color. °· You continue to feel nauseous or vomit. °· Your pain is getting worse and is not helped by medicine. °· You  have bleeding or swelling. °· You are still sleepy or feeling clumsy after 24 hours. °SEEK IMMEDIATE MEDICAL CARE IF: °· You develop a rash. °· You have difficulty breathing. °· You develop any type of allergic problem. °· You have a fever. °MAKE SURE YOU: °· Understand these instructions. °· Will watch your condition. °· Will get help right away if you are not doing well or get worse. °Document Released: 06/20/2013 Document Reviewed: 06/20/2013 °[employer]® Patient Information ©2015 [employer]. This information is not intended to replace advice given to you by your health care provider. Make sure you discuss any questions you have with your health care provider. ° °

## 2014-09-30 NOTE — Procedures (Signed)
Procedure:  Ultrasound guided core biopsy of left submandibular lymph node Findings:  Four 18 G core samples obtained of enlarged left submandibular lymph node.  No complications.

## 2014-10-08 ENCOUNTER — Telehealth: Payer: Self-pay | Admitting: Hematology & Oncology

## 2014-10-08 NOTE — Telephone Encounter (Signed)
I called and spoke with patient and gave her new patient apt date, time, address, directions, and instructions to bring updated insurance and medication bottles.  Patient has information and stated she was going to call her doctor to see what was going on but to keep apt just in case she decides to come tomorrow.  Rn was notified of all of this information.

## 2014-10-08 NOTE — Telephone Encounter (Signed)
Shannon Parsons made and spoke w NEW PATIENT today to remind them of their appointment with Dr. Marin Olp. Also, advised them to bring all medication bottles and insurance card information.

## 2014-10-09 ENCOUNTER — Encounter: Payer: Self-pay | Admitting: Hematology & Oncology

## 2014-10-09 ENCOUNTER — Other Ambulatory Visit (HOSPITAL_BASED_OUTPATIENT_CLINIC_OR_DEPARTMENT_OTHER): Payer: Medicare Other | Admitting: Lab

## 2014-10-09 ENCOUNTER — Ambulatory Visit (HOSPITAL_BASED_OUTPATIENT_CLINIC_OR_DEPARTMENT_OTHER): Payer: Medicare Other | Admitting: Hematology & Oncology

## 2014-10-09 ENCOUNTER — Ambulatory Visit: Payer: Medicare Other

## 2014-10-09 VITALS — BP 125/61 | HR 73 | Temp 98.1°F | Resp 14 | Ht 62.0 in | Wt 131.0 lb

## 2014-10-09 DIAGNOSIS — C8310 Mantle cell lymphoma, unspecified site: Secondary | ICD-10-CM

## 2014-10-09 LAB — CMP (CANCER CENTER ONLY)
ALT(SGPT): 23 U/L (ref 10–47)
AST: 43 U/L — AB (ref 11–38)
Albumin: 3.4 g/dL (ref 3.3–5.5)
Alkaline Phosphatase: 74 U/L (ref 26–84)
BILIRUBIN TOTAL: 0.6 mg/dL (ref 0.20–1.60)
BUN, Bld: 11 mg/dL (ref 7–22)
CO2: 28 meq/L (ref 18–33)
CREATININE: 0.5 mg/dL — AB (ref 0.6–1.2)
Calcium: 8.9 mg/dL (ref 8.0–10.3)
Chloride: 100 mEq/L (ref 98–108)
Glucose, Bld: 80 mg/dL (ref 73–118)
Potassium: 4 mEq/L (ref 3.3–4.7)
Sodium: 137 mEq/L (ref 128–145)
Total Protein: 6.9 g/dL (ref 6.4–8.1)

## 2014-10-09 LAB — CBC WITH DIFFERENTIAL (CANCER CENTER ONLY)
BASO#: 0 10*3/uL (ref 0.0–0.2)
BASO%: 0.2 % (ref 0.0–2.0)
EOS ABS: 0.4 10*3/uL (ref 0.0–0.5)
EOS%: 2.6 % (ref 0.0–7.0)
HCT: 39.8 % (ref 34.8–46.6)
HGB: 12.8 g/dL (ref 11.6–15.9)
LYMPH#: 9.5 10*3/uL — ABNORMAL HIGH (ref 0.9–3.3)
LYMPH%: 69.4 % — AB (ref 14.0–48.0)
MCH: 27.4 pg (ref 26.0–34.0)
MCHC: 32.2 g/dL (ref 32.0–36.0)
MCV: 85 fL (ref 81–101)
MONO#: 0.9 10*3/uL (ref 0.1–0.9)
MONO%: 6.6 % (ref 0.0–13.0)
NEUT#: 2.9 10*3/uL (ref 1.5–6.5)
NEUT%: 21.2 % — ABNORMAL LOW (ref 39.6–80.0)
Platelets: 217 10*3/uL (ref 145–400)
RBC: 4.67 10*6/uL (ref 3.70–5.32)
RDW: 14.2 % (ref 11.1–15.7)
WBC: 13.6 10*3/uL — AB (ref 3.9–10.0)

## 2014-10-09 LAB — TECHNOLOGIST REVIEW CHCC SATELLITE

## 2014-10-09 NOTE — Progress Notes (Signed)
Referral MD  Reason for Referral: Mantle cell lymphoma   Chief Complaint  Patient presents with  . NEW PATIENT  : I have cancer.  HPI: Shannon Parsons is a very nice 74 year old white female. She's been in fairly decent health. She does have a history of asthma. She does have irritable bowel syndrome.  She had been feeling well since June of last year. She did have some abdominal discomfort. She was told that she had "silent reflux". She was treated for this.  She continued to decline. She again has some discomfort over the left upper quadrant.  She really has not lost much weight. Her appetite is okay. She's had no change in bowel or bladder habits.  I think she had a upper endoscopy last year. This showed some areas of erosive gastritis. Biopsies were taken. The biopsies show some areas of lymphoid infiltrates. There was some chronic active gastritis. Helicobacter was negative.  She ultimately underwent some scans. She began to have some lymphadenopathy in the neck. CT of the neck showed extensive bilateral cervical lymph nodes. There was an enlarged pretracheal lymph nodes. She had some mild splenomegaly. She had some hepatic cysts. She had extensive retroperitoneal, abdominal and mesenteric lymph nodes.  She then underwent a biopsy of a left submandibular lymph node. This was done on January 18. The pathology report ([id number]) showed mantle cell lymphoma. The cells were positive for Cyclin D1.  She was then, referred to the [facility] for an evaluation.  She has had this tremor that he she has had for several years.  She's had no rashes. She's had no pruritus. She's had no cough. She's had no dysphagia or odynophagia.  Currently, her performance status is ECOG 1.             Past Medical History  Diagnosis Date  . OSA (obstructive sleep apnea)   . Hiatal hernia   . IBS (irritable bowel syndrome)   . Fibrocystic breast disease   . Asthma   .  Diverticulosis   . Adenomatous colon polyp 1994  . Internal hemorrhoids   . Melanoma     Facial  . Hyperlipidemia   . Hypertension   . GERD (gastroesophageal reflux disease)   . Osteoporosis   . Osteoarthritis   . Vitamin D deficiency   . Prediabetes   . Unspecified hypothyroidism   . Family history of ischemic heart disease   :  Past Surgical History  Procedure Laterality Date  . Appendectomy    . Tonsillectomy    . Bunionectomy    . Knee arthroscopy    . Umbilical hernia repair    :   Current outpatient prescriptions:  .  ALPRAZolam (XANAX) 0.5 MG tablet, Take 1/2 to 1 tablet 3 x day  as needed for anxiety or sleep (Patient taking differently: Take 0.5 mg by mouth 3 (three) times daily as needed for anxiety or sleep. ), Disp: 90 tablet, Rfl: 3 .  Cholecalciferol (VITAMIN D) 2000 UNITS CAPS, Take 2,000-4,000 capsules by mouth every other day. Patient alternates 1 cap with 2 caps every other day., Disp: , Rfl:  .  hyoscyamine (LEVSIN, ANASPAZ) 0.125 MG tablet, Take 1 to 2 tablets every 4 hours as needed for nausea, cramping, bloating or diarrhea, Disp: 90 tablet, Rfl: 11 .  levothyroxine (SYNTHROID, LEVOTHROID) 50 MCG tablet, Take 50 mcg by mouth daily before breakfast., Disp: , Rfl:  .  Magnesium 200 MG TABS, Take 1 tablet by mouth 4 (four)  times a week. , Disp: , Rfl:  .  QVAR 80 MCG/ACT inhaler, INHALE 2 PUFFS INTO THE LUNGS 2 TIMES DAILY., Disp: 8.7 g, Rfl: 6 .  Zinc 50 MG TABS, Take 25 mg by mouth daily., Disp: , Rfl: :  :  Allergies  Allergen Reactions  . Actonel [Risedronate Sodium]     "made me choke"   . Fosamax [Alendronate]     "made me choke"  . Sulfa Antibiotics     Doesn't remember   . Tetracyclines & Related Rash  :  Family History  Problem Relation Age of Onset  . Emphysema Father   . Heart disease Father   . Heart attack Father 8    Died  . Heart disease Brother   . Rheum arthritis Sister   . Hypertension Mother   . Thyroid disease Mother    . Colon cancer Neg Hx   :  History   Social History  . Marital Status: Married    Spouse Name: N/A    Number of Children: N/A  . Years of Education: N/A   Occupational History  . retired from office work    Social History Main Topics  . Smoking status: Never Smoker   . Smokeless tobacco: Never Used     Comment: Never Used Tobacco  . Alcohol Use: No  . Drug Use: No  . Sexual Activity: Not on file   Other Topics Concern  . Not on file   Social History Narrative  :  Pertinent items are noted in HPI.  Exam: @[facility] @  thin but well-nourished white female in no obvious distress. Vital signs show temperature of 97.1. Pulse 71. Blood pressure 125/61. Weight is 31 pounds. Head and neck exam shows bilateral cervical lymph nodes. She has some ecchymoses under the left mandible. Lymph nodes are all about 1 cm in size. They're mobile. Thyroid is nontender. She has no ocular or oral lesions. Lungs are clear. Cardiac exam regular rate and rhythm with no murmurs, rubs or bruits. Axillary exam shows bilateral axillary adenopathy. She has bilateral lymph nodes in the axilla measuring up to 4-57 m. There are nontender and mobile. Abdomen is soft. She has good bowel sounds. There is no fluid wave. She slightly distended. She has good bowel sounds. She has no obvious hepatosplenomegaly. I cannot palpate any inguinal lymph nodes. Back exam shows no kyphosis. She has no tenderness over the spine, ribs or hips. Extremities shows no clubbing, cyanosis or edema. She has good strength in her joints. She has good range of motion of her joints. Skin exam shows no rashes, ecchymoses or petechia. Neurological exam is nonfocal. She does have this tremor with her head.       Recent Labs  10/09/14 1358  WBC 13.6*  HGB 12.8  HCT 39.8  PLT 217    Recent Labs  10/09/14 1358  NA 137  K 4.0  CL 100  CO2 28  GLUCOSE 80  BUN 11  CREATININE 0.5*  CALCIUM 8.9    Blood smear review: Normochromic  and normocytic population of red blood cells. There is no nucleated red blood cells. I see no teardrop cells. She has no rouleau formation. I see no schistocytes or spherocytes. White cells are slightly elevated number. She is mostly mature appearing lymphocytes. I do not see any hypersegmented polys. She has no blasts. Platelets were adequate in number and size.   Pathology: see above     Assessment and Plan:  Shannon Parsons  is a very charming 74 year old white female. She has mantle cell lymphoma. This was confirmed on biopsy.  I spent a good hour with she and her husband. I explained to them the nature of mantle cell lymphoma. I told her that this is very difficult to cure. It is easy to treat but very hard to cure outside of a stem cell transplant. She is at the age limit for a stem cell transplant.  She would still be a very good candidate for systemic chemotherapy.  We still have to do a restaging studies. She needs to have a PET scan done. She needs to have a bone marrow biopsy done. She is having echocardiogram done. She is need to have an MRI of the brain done. Whether not we do a spinal tap might depend on the bone marrow results.  I would think that given her CBC, that she probably does have bone marrow involvement.  As far as systemic chemotherapy goes, I am not sure how she would tolerate highly aggressive chemotherapy with HyperCVAD. High-dose methotrexate-based therapy also might be difficult.  I note that recent studies have shown the utility of bendamustine for mantle cell lymphoma. In addition, studies have shown that utilizing bortezomib with R-CHOP shows very good results.  She will need to have a Port-A-Cath in place.  I think once we get the results back from all of her staging studies, then we will get her back in here for an evaluation for therapy.  I gave her a prayer blanket. She is very appreciative of this.  I answered all their questions.

## 2014-10-10 ENCOUNTER — Other Ambulatory Visit: Payer: Self-pay | Admitting: Radiology

## 2014-10-10 ENCOUNTER — Ambulatory Visit (HOSPITAL_COMMUNITY)
Admission: RE | Admit: 2014-10-10 | Discharge: 2014-10-10 | Disposition: A | Discharge status: Home / Self Care | Payer: Medicare Other | Source: Ambulatory Visit | Attending: Hematology & Oncology | Admitting: Hematology & Oncology

## 2014-10-10 DIAGNOSIS — Z5111 Encounter for antineoplastic chemotherapy: Secondary | ICD-10-CM

## 2014-10-10 DIAGNOSIS — C8310 Mantle cell lymphoma, unspecified site: Secondary | ICD-10-CM | POA: Insufficient documentation

## 2014-10-10 MED ORDER — GADOBENATE DIMEGLUMINE 529 MG/ML IV SOLN
15.0000 mL | Freq: Once | INTRAVENOUS | Status: AC | PRN
  Administered 2014-10-10: 12 mL via INTRAVENOUS

## 2014-10-10 NOTE — Progress Notes (Signed)
Echocardiogram 2D Echocardiogram has been performed.  Shannon Parsons 10/10/2014, 11:14 AM

## 2014-10-11 ENCOUNTER — Telehealth: Payer: Self-pay | Admitting: *Deleted

## 2014-10-11 ENCOUNTER — Other Ambulatory Visit: Payer: Self-pay | Admitting: Radiology

## 2014-10-11 LAB — LACTATE DEHYDROGENASE: LDH: 219 U/L (ref 94–250)

## 2014-10-11 LAB — BETA 2 MICROGLOBULIN, SERUM: Beta-2 Microglobulin: 4.43 mg/L — ABNORMAL HIGH (ref <=2.51)

## 2014-10-11 NOTE — Telephone Encounter (Addendum)
Aware of echo results.  ----- Message from Volanda Napoleon, MD sent at 10/10/2014  6:48 PM EST ----- Please call her and let her know that her heart is pumping like a champion!!! This is fantastic!!!!! Thanks

## 2014-10-11 NOTE — Telephone Encounter (Signed)
-----   Message from Volanda Napoleon, MD sent at 10/11/2014 12:13 PM EST ----- These call and tell her that the MRI of the brain looks all right. No obvious issues to help explain the tremor. No obvious lymphoma. Thanks

## 2014-10-14 ENCOUNTER — Other Ambulatory Visit: Payer: Self-pay | Admitting: Hematology & Oncology

## 2014-10-14 ENCOUNTER — Ambulatory Visit (HOSPITAL_COMMUNITY)
Admission: RE | Admit: 2014-10-14 | Discharge: 2014-10-14 | Disposition: A | Discharge status: Home / Self Care | Payer: Medicare Other | Source: Ambulatory Visit | Attending: Hematology & Oncology | Admitting: Hematology & Oncology

## 2014-10-14 ENCOUNTER — Encounter (HOSPITAL_COMMUNITY): Payer: Self-pay

## 2014-10-14 DIAGNOSIS — Z452 Encounter for adjustment and management of vascular access device: Secondary | ICD-10-CM | POA: Insufficient documentation

## 2014-10-14 DIAGNOSIS — C8310 Mantle cell lymphoma, unspecified site: Secondary | ICD-10-CM | POA: Diagnosis not present

## 2014-10-14 DIAGNOSIS — E785 Hyperlipidemia, unspecified: Secondary | ICD-10-CM | POA: Diagnosis not present

## 2014-10-14 DIAGNOSIS — Z7951 Long term (current) use of inhaled steroids: Secondary | ICD-10-CM | POA: Insufficient documentation

## 2014-10-14 DIAGNOSIS — I10 Essential (primary) hypertension: Secondary | ICD-10-CM | POA: Diagnosis not present

## 2014-10-14 DIAGNOSIS — G4733 Obstructive sleep apnea (adult) (pediatric): Secondary | ICD-10-CM | POA: Insufficient documentation

## 2014-10-14 DIAGNOSIS — J45909 Unspecified asthma, uncomplicated: Secondary | ICD-10-CM | POA: Insufficient documentation

## 2014-10-14 DIAGNOSIS — E039 Hypothyroidism, unspecified: Secondary | ICD-10-CM | POA: Diagnosis not present

## 2014-10-14 DIAGNOSIS — K449 Diaphragmatic hernia without obstruction or gangrene: Secondary | ICD-10-CM | POA: Diagnosis not present

## 2014-10-14 DIAGNOSIS — Z79899 Other long term (current) drug therapy: Secondary | ICD-10-CM | POA: Diagnosis not present

## 2014-10-14 DIAGNOSIS — K219 Gastro-esophageal reflux disease without esophagitis: Secondary | ICD-10-CM | POA: Insufficient documentation

## 2014-10-14 DIAGNOSIS — E559 Vitamin D deficiency, unspecified: Secondary | ICD-10-CM | POA: Diagnosis not present

## 2014-10-14 DIAGNOSIS — K589 Irritable bowel syndrome without diarrhea: Secondary | ICD-10-CM | POA: Insufficient documentation

## 2014-10-14 LAB — CBC WITH DIFFERENTIAL/PLATELET
Basophils Absolute: 0 10*3/uL (ref 0.0–0.1)
Basophils Relative: 0 % (ref 0–1)
EOS ABS: 0.3 10*3/uL (ref 0.0–0.7)
Eosinophils Relative: 2 % (ref 0–5)
HEMATOCRIT: 41.7 % (ref 36.0–46.0)
HEMOGLOBIN: 13.7 g/dL (ref 12.0–15.0)
Lymphocytes Relative: 64 % — ABNORMAL HIGH (ref 12–46)
Lymphs Abs: 9.2 10*3/uL — ABNORMAL HIGH (ref 0.7–4.0)
MCH: 27.9 pg (ref 26.0–34.0)
MCHC: 32.9 g/dL (ref 30.0–36.0)
MCV: 84.9 fL (ref 78.0–100.0)
Monocytes Absolute: 0.9 10*3/uL (ref 0.1–1.0)
Monocytes Relative: 6 % (ref 3–12)
Neutro Abs: 4 10*3/uL (ref 1.7–7.7)
Neutrophils Relative %: 28 % — ABNORMAL LOW (ref 43–77)
Platelets: 215 10*3/uL (ref 150–400)
RBC: 4.91 MIL/uL (ref 3.87–5.11)
RDW: 14.4 % (ref 11.5–15.5)
WBC: 14.4 10*3/uL — ABNORMAL HIGH (ref 4.0–10.5)

## 2014-10-14 LAB — PROTIME-INR
INR: 1.08 (ref 0.00–1.49)
Prothrombin Time: 14.1 seconds (ref 11.6–15.2)

## 2014-10-14 MED ORDER — CEFAZOLIN SODIUM-DEXTROSE 2-3 GM-% IV SOLR
INTRAVENOUS | Status: AC
  Administered 2014-10-14: 2 g via INTRAVENOUS
  Filled 2014-10-14: qty 50

## 2014-10-14 MED ORDER — FENTANYL CITRATE 0.05 MG/ML IJ SOLN
INTRAMUSCULAR | Status: AC
  Filled 2014-10-14: qty 4

## 2014-10-14 MED ORDER — LIDOCAINE HCL 1 % IJ SOLN
INTRAMUSCULAR | Status: AC
  Filled 2014-10-14: qty 20

## 2014-10-14 MED ORDER — HEPARIN SOD (PORK) LOCK FLUSH 100 UNIT/ML IV SOLN
INTRAVENOUS | Status: AC | PRN
  Administered 2014-10-14: 500 [IU]

## 2014-10-14 MED ORDER — HEPARIN SOD (PORK) LOCK FLUSH 100 UNIT/ML IV SOLN
INTRAVENOUS | Status: AC
  Filled 2014-10-14: qty 5

## 2014-10-14 MED ORDER — MIDAZOLAM HCL 2 MG/2ML IJ SOLN
INTRAMUSCULAR | Status: AC | PRN
  Administered 2014-10-14: 0.5 mg via INTRAVENOUS
  Administered 2014-10-14: 1 mg via INTRAVENOUS
  Administered 2014-10-14: 0.5 mg via INTRAVENOUS
  Administered 2014-10-14 (×2): 1 mg via INTRAVENOUS

## 2014-10-14 MED ORDER — SODIUM CHLORIDE 0.9 % IV SOLN
INTRAVENOUS | Status: DC
  Administered 2014-10-14: 12:00:00 via INTRAVENOUS

## 2014-10-14 MED ORDER — MIDAZOLAM HCL 2 MG/2ML IJ SOLN
INTRAMUSCULAR | Status: AC
  Filled 2014-10-14: qty 6

## 2014-10-14 MED ORDER — FENTANYL CITRATE 0.05 MG/ML IJ SOLN
INTRAMUSCULAR | Status: AC | PRN
Start: 2014-10-14 — End: 2014-10-14
  Administered 2014-10-14: 50 ug via INTRAVENOUS
  Administered 2014-10-14: 25 ug via INTRAVENOUS
  Administered 2014-10-14: 50 ug via INTRAVENOUS

## 2014-10-14 MED ORDER — CEFAZOLIN SODIUM-DEXTROSE 2-3 GM-% IV SOLR
2.0000 g | Freq: Once | INTRAVENOUS | Status: AC
  Administered 2014-10-14: 2 g via INTRAVENOUS

## 2014-10-14 NOTE — Procedures (Signed)
Procedure:  Porta-cath placement Access:  Right IJ vein Findings:  Cath tip at cavoatrial junction.  OK to use.

## 2014-10-14 NOTE — H&P (Signed)
Chief Complaint: "I'm getting a port a cath"  Referring Physician(s): Ennever,Peter R  History of Present Illness: Shannon Parsons is a 74 y.o. female with history of recently diagnosed mantle cell lymphoma who presents today for port a cath placement for chemotherapy.   Past Medical History  Diagnosis Date  . OSA (obstructive sleep apnea)   . Hiatal hernia   . IBS (irritable bowel syndrome)   . Fibrocystic breast disease   . Asthma   . Diverticulosis   . Adenomatous colon polyp 1994  . Internal hemorrhoids   . Melanoma     Facial  . Hyperlipidemia   . Hypertension   . GERD (gastroesophageal reflux disease)   . Osteoporosis   . Osteoarthritis   . Vitamin D deficiency   . Prediabetes   . Unspecified hypothyroidism   . Family history of ischemic heart disease     Past Surgical History  Procedure Laterality Date  . Appendectomy    . Tonsillectomy    . Bunionectomy    . Knee arthroscopy    . Umbilical hernia repair      Allergies: Actonel; Fosamax; Sulfa antibiotics; and Tetracyclines & related  Medications: Prior to Admission medications   Medication Sig Start Date End Date Taking? Authorizing Provider  ALPRAZolam Duanne Moron) 0.5 MG tablet Take 1/2 to 1 tablet 3 x day  as needed for anxiety or sleep Patient taking differently: Take 0.5 mg by mouth 3 (three) times daily as needed for anxiety or sleep.  07/15/14 01/13/15  Unk Pinto, MD  Cholecalciferol (VITAMIN D) 2000 UNITS CAPS Take 2,000-4,000 capsules by mouth every other day. Patient alternates 1 cap with 2 caps every other day.    Historical Provider, MD  hyoscyamine (LEVSIN, ANASPAZ) 0.125 MG tablet Take 1 to 2 tablets every 4 hours as needed for nausea, cramping, bloating or diarrhea 08/19/14   Unk Pinto, MD  levothyroxine (SYNTHROID, LEVOTHROID) 50 MCG tablet Take 50 mcg by mouth daily before breakfast.    Historical Provider, MD  Magnesium 200 MG TABS Take 1 tablet by mouth 4 (four) times a week.      Historical Provider, MD  QVAR 80 MCG/ACT inhaler INHALE 2 PUFFS INTO THE LUNGS 2 TIMES DAILY. 07/08/14   Kathee Delton, MD  Zinc 50 MG TABS Take 25 mg by mouth daily.    Historical Provider, MD    Family History  Problem Relation Age of Onset  . Emphysema Father   . Heart disease Father   . Heart attack Father 37    Died  . Heart disease Brother   . Rheum arthritis Sister   . Hypertension Mother   . Thyroid disease Mother   . Colon cancer Neg Hx     History   Social History  . Marital Status: Married    Spouse Name: N/A    Number of Children: N/A  . Years of Education: N/A   Occupational History  . retired from office work    Social History Main Topics  . Smoking status: Never Smoker   . Smokeless tobacco: Never Used     Comment: Never Used Tobacco  . Alcohol Use: No  . Drug Use: No  . Sexual Activity: None   Other Topics Concern  . None   Social History Narrative     Review of Systems  Constitutional: Positive for unexpected weight change. Negative for fever and chills.  Respiratory: Negative for cough and shortness of breath.   Cardiovascular: Negative  for chest pain.  Gastrointestinal: Positive for abdominal pain. Negative for nausea, vomiting and blood in stool.  Genitourinary: Negative for dysuria and hematuria.  Musculoskeletal:       Occ back pain  Neurological: Positive for tremors. Negative for headaches.  Psychiatric/Behavioral: The patient is nervous/anxious.     Vital Signs: BP 131/76 mmHg  Pulse 78  Temp(Src) 98.1 F (36.7 C) (Oral)  Resp 16  Ht 5\' 2"  (1.575 m)  Wt 131 lb (59.421 kg)  BMI 23.95 kg/m2  SpO2 99%  LMP 09/14/2014  Physical Exam  Constitutional: She is oriented to person, place, and time. She appears well-developed and well-nourished.  Cardiovascular: Normal rate and regular rhythm.   Pulmonary/Chest: Effort normal and breath sounds normal.  Abdominal: Soft. Bowel sounds are normal.  Mild gen tenderness    Musculoskeletal: Normal range of motion. She exhibits no edema.  Lymphadenopathy:    She has cervical adenopathy.  Neurological: She is alert and oriented to person, place, and time.    Imaging: Ct Soft Tissue Neck W Contrast  09/16/2014   CLINICAL DATA:  Cervical adenopathy.  Right-sided neck pain.  EXAM: CT NECK WITH CONTRAST  TECHNIQUE: Multidetector CT imaging of the neck was performed using the standard protocol following the bolus administration of intravenous contrast.  CONTRAST:  3mL OMNIPAQUE IOHEXOL 300 MG/ML  SOLN  COMPARISON:  Ultrasound of August 23, 2014. CT scan of chest of Feb 06, 2013.  FINDINGS: Pharynx and larynx: Epiglottis appears normal. Airway is patent. Larynx appears normal.  Salivary glands: No definite salivary abnormality seen.  Thyroid: Normal.  Lymph nodes: Extensive adenopathy is seen involving the level 1, 2, 3 and 4 regions bilaterally. Largest lymph node measures 17 x 10 mm in the right submandibular region. 20 x 8 mm lymph node is noted in left occipital region.  Vascular: Visualized vasculature appears normal.  Limited intracranial: No definite abnormality visualized.  Mastoids and visualized paranasal sinuses: Mucosal thickening is noted in the inferior portions of both maxillary sinuses, with left greater than right.  Skeleton: Severe degenerative disc disease is noted at C5-6 and C6-7.  Upper chest: No significant abnormality seen involving the lung parenchyma of the visualize upper chest. 16 x 12 mm pretracheal lymph node is noted which is enlarged compared to prior exam.  IMPRESSION: Extensive bilateral cervical adenopathy is noted, as well as enlarged pretracheal lymph node seen in superior mediastinum which is increased compared to prior exam. While this may simply be inflammatory in origin, neoplasm or malignancy cannot be excluded and tissue sampling is recommended.   Electronically Signed   By: Sabino Dick M.D.   On: 09/16/2014 09:19   Mr Jeri Cos YW  Contrast  10/10/2014   CLINICAL DATA:  74 year old female with mantle cell lymphoma, recent onset tremor. Subsequent encounter.  EXAM: MRI HEAD WITHOUT AND WITH CONTRAST  TECHNIQUE: Multiplanar, multiecho pulse sequences of the brain and surrounding structures were obtained without and with intravenous contrast.  CONTRAST:  33mL MULTIHANCE GADOBENATE DIMEGLUMINE 529 MG/ML IV SOLN  COMPARISON:  Neck CT 09/16/2014.  FINDINGS: Cerebral volume is within normal limits for age. No restricted diffusion to suggest acute infarction. No midline shift, mass effect, evidence of mass lesion, ventriculomegaly, extra-axial collection or acute intracranial hemorrhage. Cervicomedullary junction and pituitary are within normal limits. No abnormal enhancement of the brain identified.  Minimal to mild scattered nonspecific cerebral white matter T2 and FLAIR hyperintensity, most commonly due to chronic small vessel disease. Otherwise gray and white matter signal  within normal limits for age throughout the brain. Major intracranial vascular flow voids are preserved.  Mild left mastoid effusion. Visible internal auditory structures appear normal. Moderate scattered paranasal sinus mucosal thickening. Postoperative changes to the globes.  Heterogeneous bone marrow signal in the skull and upper cervical spine. The appearance is suspicious for early metastatic disease to bone, although no overtly destructive bone lesion is identified.  There is extensive abnormal cervical and lower scalp lymphadenopathy, with notable involvement of the bilateral level 5 nodal stations, and likely also infiltration of the left submandibular gland (series 5, image 23 and series 10, image 17).  IMPRESSION: 1. No metastatic disease to the brain identified.  No acute infarct. 2. Extensive scalp and neck lymph nodal disease. Suspect lymphoma this infiltration of the left submandibular gland. 3. Heterogeneous bone marrow signal is nonspecific and might relate to  sequelae of therapy, but consider also early metastatic bone disease.   Electronically Signed   By: Lars Pinks M.D.   On: 10/10/2014 19:19   US Abdomen Complete  09/17/2014   CLINICAL DATA:  Abdominal pain  EXAM: ULTRASOUND ABDOMEN COMPLETE  COMPARISON:  CT abdomen 01/01/2014, ultrasound 12/03/2009  FINDINGS: Gallbladder: No gallstones or wall thickening visualized. No sonographic Murphy sign noted.  Common bile duct: Diameter: 3.7 mm  Liver: 8 mm cyst left lobe.  No liver lesion.  IVC: No abnormality visualized.  Pancreas: 2 soft tissue masses are seen in the region of the body of the pancreas and head of the pancreas. The mass in the body the pancreas measures 16 x 8 x 17 mm. The mass in the head of the pancreas measures 2.2 x 1.1 x 2.4 cm. Possible lymph nodes. These are not identified on the prior CT.  Spleen: Splenomegaly with a splenic volume 501 mL. Spleen was not enlarged on the prior CT.  Right Kidney: Length: 9.8 cm. Echogenicity within normal limits. No mass or hydronephrosis visualized.  Left Kidney: Length: 9.7 cm. Echogenicity within normal limits. No mass or hydronephrosis visualized.  Abdominal aorta: No aneurysm visualized.  Other findings: None.  IMPRESSION: Negative for gallstones.  Splenomegaly  16 mm and 22 mm soft tissue masses in the region of the pancreas, favor adenopathy. CT abdomen with oral and IV contrast suggested further evaluation to exclude underlying pancreatic mass.   Electronically Signed   By: Franchot Gallo M.D.   On: 09/17/2014 09:39   US Transvaginal Non-ob  09/17/2014   CLINICAL DATA:  Right lower quadrant and epigastric pain. Abdominal bloating.  EXAM: TRANSABDOMINAL AND TRANSVAGINAL ULTRASOUND OF PELVIS  TECHNIQUE: Both transabdominal and transvaginal ultrasound examinations of the pelvis were performed. Transabdominal technique was performed for global imaging of the pelvis including uterus, ovaries, adnexal regions, and pelvic cul-de-sac. It was necessary to proceed  with endovaginal exam following the transabdominal exam to visualize the endometrium and right ovary.  COMPARISON:  None  FINDINGS: Uterus  Measurements: 4.0 x 1.5 x 2.6 cm. No fibroids or other mass visualized.  Endometrium  Thickness: 7 mm.  No focal abnormality visualized.  Right ovary  Measurements: Not visualized.  No adnexal masses seen.  Left ovary  Measurements: 3.0 x 1.5 x 1.8 cm. Normal appearance/no adnexal mass.  Other findings  No free fluid.  IMPRESSION: Unremarkable pelvic ultrasound. Right ovary not visualized, but no adnexal mass seen.   Electronically Signed   By: Rolm Baptise M.D.   On: 09/17/2014 11:35   US Pelvis Complete  09/17/2014   CLINICAL DATA:  Right  lower quadrant and epigastric pain. Abdominal bloating.  EXAM: TRANSABDOMINAL AND TRANSVAGINAL ULTRASOUND OF PELVIS  TECHNIQUE: Both transabdominal and transvaginal ultrasound examinations of the pelvis were performed. Transabdominal technique was performed for global imaging of the pelvis including uterus, ovaries, adnexal regions, and pelvic cul-de-sac. It was necessary to proceed with endovaginal exam following the transabdominal exam to visualize the endometrium and right ovary.  COMPARISON:  None  FINDINGS: Uterus  Measurements: 4.0 x 1.5 x 2.6 cm. No fibroids or other mass visualized.  Endometrium  Thickness: 7 mm.  No focal abnormality visualized.  Right ovary  Measurements: Not visualized.  No adnexal masses seen.  Left ovary  Measurements: 3.0 x 1.5 x 1.8 cm. Normal appearance/no adnexal mass.  Other findings  No free fluid.  IMPRESSION: Unremarkable pelvic ultrasound. Right ovary not visualized, but no adnexal mass seen.   Electronically Signed   By: Rolm Baptise M.D.   On: 09/17/2014 11:35   US Biopsy  09/30/2014   CLINICAL DATA:  Diffuse lymphadenopathy including palpable enlarged cervical, posterior auricular and submandibular lymph nodes. The patient presents for biopsy.  EXAM: ULTRASOUND GUIDED CORE BIOPSY OF LEFT  SUBMANDIBULAR LYMPH NODE  COMPARISON:  CT of the neck on 09/16/2014  MEDICATIONS: 4.0 mg IV Versed; 100 mcg IV Fentanyl  Total Moderate Sedation Time: 14 min  PROCEDURE: The procedure, risks, benefits, and alternatives were explained to the patient. Questions regarding the procedure were encouraged and answered. The patient understands and consents to the procedure.  The left neck was prepped with Betadine in a sterile fashion, and a sterile drape was applied covering the operative field. A sterile gown and sterile gloves were used for the procedure. Local anesthesia was provided with 1% Lidocaine. A time-out was performed prior to the procedure.  Ultrasound was performed of the neck. A left submandibular lymph node was targeted. A total of 4 separate 18 gauge core biopsy samples were obtained in different portions of the lymph node and submitted in saline.  COMPLICATIONS: None.  FINDINGS: The largest identifiable lymph node by ultrasound is a 2.4 cm long left submandibular node. There are some adjacent small nodes in the left submandibular region. Cervical lymph nodes are also present bilaterally with the largest measuring approximately 1.3 cm.  Solid tissue was obtained from the left submandibular lymph node.  IMPRESSION: Ultrasound-guided core biopsy performed of an enlarged left submandibular lymph node.   Electronically Signed   By: Aletta Edouard M.D.   On: 09/30/2014 14:40   Ct Abd Wo & W Cm  09/20/2014   CLINICAL DATA:  Pancreatic mass seen on ultrasound. Epigastric pain. Lymphadenopathy.  EXAM: CT ABDOMEN WITHOUT AND WITH CONTRAST  TECHNIQUE: Multidetector CT imaging of the abdomen was performed following the standard protocol before and following the bolus administration of intravenous contrast.  CONTRAST:  141mL OMNIPAQUE IOHEXOL 300 MG/ML  SOLN  COMPARISON:  Multiple exams, including 09/17/2014 and 01/01/2014  FINDINGS: Lower chest: A right lower lobe bulla is observed. Small type 1 hiatal hernia.   Hepatobiliary: Several hypodense lesions are present in the liver and most compatible with cysts, being a fluid density and visible on delayed images. Minimal intrahepatic biliary dilatation in segment 5. Mild steatosis adjacent to the falciform ligament. Gallbladder unremarkable.  Pancreas: Unremarkable  Spleen: Mild splenomegaly, estimated at 420 cc.  Adrenals/Urinary Tract: Bilateral extrarenal collecting system. Mild right hydronephrosis terminating at the UPJ.  Stomach/Bowel: Unremarkable  Vascular/Lymphatic: Extensive retroperitoneal, porta hepatis, peripancreatic, gastrohepatic ligament, and mesenteric adenopathy. Index porta hepatis node  1.5 cm in short axis, image 52 series 5. Index node anterior to the right psoas muscle 1.6 cm in short axis, image 97 series 5.  Aortoiliac atherosclerotic vascular disease.  Musculoskeletal: Chronic bilateral pars defects at L5 with grade 1 anterolisthesis. Central disc protrusion at L1-2 with disc bulges at L2-3 and L3-4.  IMPRESSION: 1. Considerable retroperitoneal, porta hepatis, peripancreatic, gastrohepatic ligament, and mesenteric adenopathy tracking down into the pelvis. Mild splenomegaly. Appearance favors lymphoma although otherwise occult malignancy could also cause extensive adenopathy. Consider nuclear medicine PET-CT or biopsy. 2. Mild right hydronephrosis suspicious for partial UPJ obstruction. 3. Ancillary findings include small type 1 hiatal hernia, degenerative disc disease, and chronic pars defects at L5 with grade 1 anterolisthesis.   Electronically Signed   By: Sherryl Barters M.D.   On: 09/20/2014 13:14    Labs:  CBC:  Recent Labs  09/10/14 1459 09/30/14 1130 10/09/14 1358 10/14/14 1225  WBC 11.1* 12.3* 13.6* 14.4*  HGB 12.9 14.3 12.8 13.7  HCT 39.6 44.0 39.8 41.7  PLT 251 213 217 215    COAGS:  Recent Labs  09/30/14 1130  INR 1.07  APTT 25    BMP:  Recent Labs  12/20/13 1450  02/27/14 0536 02/27/14 1845 07/15/14 1524  09/10/14 1459 10/09/14 1358  NA 135  --  141  --  139 138 137  K 4.6  --  3.9  --  3.8 4.3 4.0  CL 101  --  103  --  103 102 100  CO2 29  --  26  --  27 27 28   GLUCOSE 81  --  137*  --  79 84 80  BUN 13  --  15  --  11 13 11   CALCIUM 9.5  --  9.7  --  9.3 9.4 8.9  CREATININE 0.76  < > 0.84 0.90 0.79 0.84 0.5*  GFRNONAA 80  --  69* 63* 76  --   --   GFRAA >89  --  80* 73* 88  --   --   < > = values in this interval not displayed.  LIVER FUNCTION TESTS:  Recent Labs  02/27/14 0536 04/23/14 1429 07/15/14 1524 09/10/14 1459 10/09/14 1358  BILITOT 0.3 0.9 0.5 0.5 0.60  AST 34 25 43* 39* 43*  ALT 17 19 26 23 23   ALKPHOS 67 55 80 83 74  PROT 7.2 6.8 6.5 6.5 6.9  ALBUMIN 3.7 3.9 4.1 3.9  --     TUMOR MARKERS: No results for input(s): AFPTM, CEA, CA199, CHROMGRNA in the last 8760 hours.  Assessment and Plan: Shannon Parsons is a 74 y.o. female with history of recently diagnosed mantle cell lymphoma who presents today for port a cath placement for chemotherapy. Details/risks of procedure d/w pt/husband with their understanding and consent.    Signed: Autumn Messing 10/14/2014, 12:55 PM

## 2014-10-14 NOTE — Discharge Instructions (Signed)
Implanted Port Insertion, Care After °Refer to this sheet in the next few weeks. These instructions provide you with information on caring for yourself after your procedure. Your health care provider may also give you more specific instructions. Your treatment has been planned according to current medical practices, but problems sometimes occur. Call your health care provider if you have any problems or questions after your procedure. °WHAT TO EXPECT AFTER THE PROCEDURE °After your procedure, it is typical to have the following:  °· Discomfort at the port insertion site. Ice packs to the area will help. °· Bruising on the skin over the port. This will subside in 3-4 days. °HOME CARE INSTRUCTIONS °· After your port is placed, you will get a manufacturer's information card. The card has information about your port. Keep this card with you at all times.   °· Know what kind of port you have. There are many types of ports available.   °· Wear a medical alert bracelet in case of an emergency. This can help alert health care workers that you have a port.   °· The port can stay in for as long as your health care provider believes it is necessary.   °· A home health care nurse may give medicines and take care of the port.   °· You or a family member can get special training and directions for giving medicine and taking care of the port at home.   °SEEK MEDICAL CARE IF:  °· Your port does not flush or you are unable to get a blood return.   °· You have a fever or chills. °SEEK IMMEDIATE MEDICAL CARE IF: °· You have new fluid or pus coming from your incision.   °· You notice a bad smell coming from your incision site.   °· You have swelling, pain, or more redness at the incision or port site.   °· You have chest pain or shortness of breath. °Document Released: 06/20/2013 Document Revised: 09/04/2013 Document Reviewed: 06/20/2013 °ExitCare® Patient Information ©2015 [employer]. This information is not intended to replace  advice given to you by your health care provider. Make sure you discuss any questions you have with your health care provider. °Implanted Port Home Guide °An implanted port is a type of central line that is placed under the skin. Central lines are used to provide IV access when treatment or nutrition needs to be given through a person's veins. Implanted ports are used for long-term IV access. An implanted port may be placed because:  °· You need IV medicine that would be irritating to the small veins in your hands or arms.   °· You need long-term IV medicines, such as antibiotics.   °· You need IV nutrition for a long period.   °· You need frequent blood draws for lab tests.   °· You need dialysis.   °Implanted ports are usually placed in the chest area, but they can also be placed in the upper arm, the abdomen, or the leg. An implanted port has two main parts:  °· Reservoir. The reservoir is round and will appear as a small, raised area under your skin. The reservoir is the part where a needle is inserted to give medicines or draw blood.   °· Catheter. The catheter is a thin, flexible tube that extends from the reservoir. The catheter is placed into a large vein. Medicine that is inserted into the reservoir goes into the catheter and then into the vein.   °HOW WILL I CARE FOR MY INCISION SITE? °Do not get the incision site wet. Bathe or   shower as directed by your health care provider.  °HOW IS MY PORT ACCESSED? °Special steps must be taken to access the port:  °· Before the port is accessed, a numbing cream can be placed on the skin. This helps numb the skin over the port site.   °· Your health care provider uses a sterile technique to access the port. °· Your health care provider must put on a mask and sterile gloves. °· The skin over your port is cleaned carefully with an antiseptic and allowed to dry. °· The port is gently pinched between sterile gloves, and a needle is inserted into the port. °· Only  "non-coring" port needles should be used to access the port. Once the port is accessed, a blood return should be checked. This helps ensure that the port is in the vein and is not clogged.   °· If your port needs to remain accessed for a constant infusion, a clear (transparent) bandage will be placed over the needle site. The bandage and needle will need to be changed every week, or as directed by your health care provider.   °· Keep the bandage covering the needle clean and dry. Do not get it wet. Follow your health care provider's instructions on how to take a shower or bath while the port is accessed.   °· If your port does not need to stay accessed, no bandage is needed over the port.   °WHAT IS FLUSHING? °Flushing helps keep the port from getting clogged. Follow your health care provider's instructions on how and when to flush the port. Ports are usually flushed with saline solution or a medicine called heparin. The need for flushing will depend on how the port is used.  °· If the port is used for intermittent medicines or blood draws, the port will need to be flushed:   °· After medicines have been given.   °· After blood has been drawn.   °· As part of routine maintenance.   °· If a constant infusion is running, the port may not need to be flushed.   °HOW LONG WILL MY PORT STAY IMPLANTED? °The port can stay in for as long as your health care provider thinks it is needed. When it is time for the port to come out, surgery will be done to remove it. The procedure is similar to the one performed when the port was put in.  °WHEN SHOULD I SEEK IMMEDIATE MEDICAL CARE? °When you have an implanted port, you should seek immediate medical care if:  °· You notice a bad smell coming from the incision site.   °· You have swelling, redness, or drainage at the incision site.   °· You have more swelling or pain at the port site or the surrounding area.   °· You have a fever that is not controlled with medicine. °Document  Released: 08/30/2005 Document Revised: 06/20/2013 Document Reviewed: 05/07/2013 °ExitCare® Patient Information ©2015 [employer]. This information is not intended to replace advice given to you by your health care provider. Make sure you discuss any questions you have with your health care provider.Conscious Sedation, Adult, Care After °Refer to this sheet in the next few weeks. These instructions provide you with information on caring for yourself after your procedure. Your health care provider may also give you more specific instructions. Your treatment has been planned according to current medical practices, but problems sometimes occur. Call your health care provider if you have any problems or questions after your procedure. °WHAT TO EXPECT AFTER THE PROCEDURE  °After your procedure: °·   You may feel sleepy, clumsy, and have poor balance for several hours. °· Vomiting may occur if you eat too soon after the procedure. °HOME CARE INSTRUCTIONS °· Do not participate in any activities where you could become injured for at least 24 hours. Do not: °¨ Drive. °¨ Swim. °¨ Ride a bicycle. °¨ Operate heavy machinery. °¨ Cook. °¨ Use power tools. °¨ Climb ladders. °¨ Work from a high place. °· Do not make important decisions or sign legal documents until you are improved. °· If you vomit, drink water, juice, or soup when you can drink without vomiting. Make sure you have little or no nausea before eating solid foods. °· Only take over-the-counter or prescription medicines for pain, discomfort, or fever as directed by your health care provider. °· Make sure you and your family fully understand everything about the medicines given to you, including what side effects may occur. °· You should not drink alcohol, take sleeping pills, or take medicines that cause drowsiness for at least 24 hours. °· If you smoke, do not smoke without supervision. °· If you are feeling better, you may resume normal activities 24 hours after you  were sedated. °· Keep all appointments with your health care provider. °SEEK MEDICAL CARE IF: °· Your skin is pale or bluish in color. °· You continue to feel nauseous or vomit. °· Your pain is getting worse and is not helped by medicine. °· You have bleeding or swelling. °· You are still sleepy or feeling clumsy after 24 hours. °SEEK IMMEDIATE MEDICAL CARE IF: °· You develop a rash. °· You have difficulty breathing. °· You develop any type of allergic problem. °· You have a fever. °MAKE SURE YOU: °· Understand these instructions. °· Will watch your condition. °· Will get help right away if you are not doing well or get worse. °Document Released: 06/20/2013 Document Reviewed: 06/20/2013 °ExitCare® Patient Information ©2015 [employer]. This information is not intended to replace advice given to you by your health care provider. Make sure you discuss any questions you have with your health care provider. ° °

## 2014-10-15 LAB — PATHOLOGIST SMEAR REVIEW

## 2014-10-17 ENCOUNTER — Encounter (HOSPITAL_COMMUNITY)
Admission: RE | Admit: 2014-10-17 | Discharge: 2014-10-17 | Disposition: A | Discharge status: Home / Self Care | Payer: Medicare Other | Source: Ambulatory Visit | Attending: Hematology & Oncology | Admitting: Hematology & Oncology

## 2014-10-17 ENCOUNTER — Other Ambulatory Visit: Payer: Self-pay | Admitting: *Deleted

## 2014-10-17 ENCOUNTER — Other Ambulatory Visit: Payer: Self-pay | Admitting: Radiology

## 2014-10-17 DIAGNOSIS — C8310 Mantle cell lymphoma, unspecified site: Secondary | ICD-10-CM | POA: Insufficient documentation

## 2014-10-17 DIAGNOSIS — F411 Generalized anxiety disorder: Secondary | ICD-10-CM

## 2014-10-17 LAB — GLUCOSE, CAPILLARY: GLUCOSE-CAPILLARY: 84 mg/dL (ref 70–99)

## 2014-10-17 MED ORDER — LIDOCAINE-PRILOCAINE 2.5-2.5 % EX CREA: 1.0000 "application " | TOPICAL_CREAM | CUTANEOUS | Status: DC | PRN

## 2014-10-17 MED ORDER — FLUDEOXYGLUCOSE F - 18 (FDG) INJECTION
5.2000 | Freq: Once | INTRAVENOUS | Status: AC | PRN
  Administered 2014-10-17: 5.2 via INTRAVENOUS

## 2014-10-18 ENCOUNTER — Ambulatory Visit (HOSPITAL_BASED_OUTPATIENT_CLINIC_OR_DEPARTMENT_OTHER): Payer: Medicare Other | Admitting: Hematology & Oncology

## 2014-10-18 ENCOUNTER — Other Ambulatory Visit (HOSPITAL_BASED_OUTPATIENT_CLINIC_OR_DEPARTMENT_OTHER): Payer: Medicare Other | Admitting: Lab

## 2014-10-18 ENCOUNTER — Encounter: Payer: Self-pay | Admitting: Hematology & Oncology

## 2014-10-18 ENCOUNTER — Other Ambulatory Visit: Payer: Self-pay | Admitting: Radiology

## 2014-10-18 DIAGNOSIS — C8310 Mantle cell lymphoma, unspecified site: Secondary | ICD-10-CM | POA: Insufficient documentation

## 2014-10-18 DIAGNOSIS — C8590 Non-Hodgkin lymphoma, unspecified, unspecified site: Secondary | ICD-10-CM

## 2014-10-18 HISTORY — DX: Mantle cell lymphoma, unspecified site: C83.10

## 2014-10-18 LAB — CHCC SATELLITE - SMEAR

## 2014-10-18 LAB — CBC WITH DIFFERENTIAL (CANCER CENTER ONLY)
BASO#: 0.1 10*3/uL (ref 0.0–0.2)
BASO%: 0.5 % (ref 0.0–2.0)
EOS%: 1.9 % (ref 0.0–7.0)
Eosinophils Absolute: 0.3 10*3/uL (ref 0.0–0.5)
HEMATOCRIT: 42.4 % (ref 34.8–46.6)
HGB: 13.7 g/dL (ref 11.6–15.9)
LYMPH#: 8.8 10*3/uL — ABNORMAL HIGH (ref 0.9–3.3)
LYMPH%: 66.5 % — ABNORMAL HIGH (ref 14.0–48.0)
MCH: 27.3 pg (ref 26.0–34.0)
MCHC: 32.3 g/dL (ref 32.0–36.0)
MCV: 85 fL (ref 81–101)
MONO#: 0.8 10*3/uL (ref 0.1–0.9)
MONO%: 6.3 % (ref 0.0–13.0)
NEUT%: 24.8 % — ABNORMAL LOW (ref 39.6–80.0)
NEUTROS ABS: 3.3 10*3/uL (ref 1.5–6.5)
Platelets: 216 10*3/uL (ref 145–400)
RBC: 5.01 10*6/uL (ref 3.70–5.32)
RDW: 14.6 % (ref 11.1–15.7)
WBC: 13.2 10*3/uL — ABNORMAL HIGH (ref 3.9–10.0)

## 2014-10-18 LAB — HEPATITIS PANEL, ACUTE
HCV Ab: NEGATIVE
HEP A IGM: NONREACTIVE
Hep B C IgM: NONREACTIVE
Hepatitis B Surface Ag: NEGATIVE

## 2014-10-18 MED ORDER — ALLOPURINOL 100 MG PO TABS
100.0000 mg | ORAL_TABLET | Freq: Every day | ORAL | Status: DC
Start: 2014-10-18 — End: 2014-11-13

## 2014-10-18 MED ORDER — ONDANSETRON HCL 8 MG PO TABS: ORAL_TABLET | ORAL | Status: DC

## 2014-10-18 MED ORDER — FAMCICLOVIR 500 MG PO TABS: 500.0000 mg | ORAL_TABLET | Freq: Every day | ORAL | Status: DC

## 2014-10-18 NOTE — Addendum Note (Signed)
Addended by: Burney Gauze R on: 10/18/2014 09:24 AM   Modules accepted: Orders

## 2014-10-21 ENCOUNTER — Ambulatory Visit (HOSPITAL_COMMUNITY)
Admission: RE | Admit: 2014-10-21 | Discharge: 2014-10-21 | Disposition: A | Discharge status: Home / Self Care | Payer: Medicare Other | Source: Ambulatory Visit | Attending: Hematology & Oncology | Admitting: Hematology & Oncology

## 2014-10-21 ENCOUNTER — Encounter (HOSPITAL_COMMUNITY): Payer: Self-pay

## 2014-10-21 DIAGNOSIS — C8310 Mantle cell lymphoma, unspecified site: Secondary | ICD-10-CM | POA: Diagnosis present

## 2014-10-21 LAB — PROTIME-INR
INR: 1.09 (ref 0.00–1.49)
PROTHROMBIN TIME: 14.3 s (ref 11.6–15.2)

## 2014-10-21 LAB — CBC
HCT: 39.4 % (ref 36.0–46.0)
Hemoglobin: 12.7 g/dL (ref 12.0–15.0)
MCH: 27.5 pg (ref 26.0–34.0)
MCHC: 32.2 g/dL (ref 30.0–36.0)
MCV: 85.3 fL (ref 78.0–100.0)
Platelets: 204 10*3/uL (ref 150–400)
RBC: 4.62 MIL/uL (ref 3.87–5.11)
RDW: 14.6 % (ref 11.5–15.5)
WBC: 15.6 10*3/uL — ABNORMAL HIGH (ref 4.0–10.5)

## 2014-10-21 LAB — APTT: APTT: 26 s (ref 24–37)

## 2014-10-21 LAB — BONE MARROW EXAM

## 2014-10-21 MED ORDER — FENTANYL CITRATE 0.05 MG/ML IJ SOLN
INTRAMUSCULAR | Status: AC
  Filled 2014-10-21: qty 4

## 2014-10-21 MED ORDER — HEPARIN SOD (PORK) LOCK FLUSH 100 UNIT/ML IV SOLN
500.0000 [IU] | INTRAVENOUS | Status: AC | PRN
  Administered 2014-10-21: 500 [IU]
  Filled 2014-10-21: qty 5

## 2014-10-21 MED ORDER — HEPARIN SOD (PORK) LOCK FLUSH 100 UNIT/ML IV SOLN: 500.0000 [IU] | INTRAVENOUS | Status: DC | PRN

## 2014-10-21 MED ORDER — SODIUM CHLORIDE 0.9 % IV SOLN
INTRAVENOUS | Status: DC
  Administered 2014-10-21: 10:00:00 via INTRAVENOUS

## 2014-10-21 MED ORDER — MIDAZOLAM HCL 2 MG/2ML IJ SOLN
INTRAMUSCULAR | Status: AC | PRN
  Administered 2014-10-21: 1 mg via INTRAVENOUS
  Administered 2014-10-21: 2 mg via INTRAVENOUS
  Administered 2014-10-21: 1 mg via INTRAVENOUS

## 2014-10-21 MED ORDER — MIDAZOLAM HCL 2 MG/2ML IJ SOLN
INTRAMUSCULAR | Status: AC
  Filled 2014-10-21: qty 6

## 2014-10-21 MED ORDER — FENTANYL CITRATE 0.05 MG/ML IJ SOLN
INTRAMUSCULAR | Status: AC | PRN
  Administered 2014-10-21: 75 ug via INTRAVENOUS
  Administered 2014-10-21: 50 ug via INTRAVENOUS

## 2014-10-21 NOTE — Discharge Instructions (Signed)
Conscious Sedation °Sedation is the use of medicines to promote relaxation and relieve discomfort and anxiety. Conscious sedation is a type of sedation. Under conscious sedation you are less alert than normal but are still able to respond to instructions or stimulation. Conscious sedation is used during short medical and dental procedures. It is milder than deep sedation or general anesthesia and allows you to return to your regular activities sooner.  °LET YOUR HEALTH CARE PROVIDER KNOW ABOUT:  °· Any allergies you have. °· All medicines you are taking, including vitamins, herbs, eye drops, creams, and over-the-counter medicines. °· Use of steroids (by mouth or creams). °· Previous problems you or members of your family have had with the use of anesthetics. °· Any blood disorders you have. °· Previous surgeries you have had. °· Medical conditions you have. °· Possibility of pregnancy, if this applies. °· Use of cigarettes, alcohol, or illegal drugs. °RISKS AND COMPLICATIONS °Generally, this is a safe procedure. However, as with any procedure, problems can occur. Possible problems include: °· Oversedation. °· Trouble breathing on your own. You may need to have a breathing tube until you are awake and breathing on your own. °· Allergic reaction to any of the medicines used for the procedure. °BEFORE THE PROCEDURE °· You may have blood tests done. These tests can help show how well your kidneys and liver are working. They can also show how well your blood clots. °· A physical exam will be done.   °· Only take medicines as directed by your health care provider. You may need to stop taking medicines (such as blood thinners, aspirin, or nonsteroidal anti-inflammatory drugs) before the procedure.   °· Do not eat or drink at least 6 hours before the procedure or as directed by your health care provider. °· Arrange for a responsible adult, family member, or friend to take you home after the procedure. He or she should stay  with you for at least 24 hours after the procedure, until the medicine has worn off. °PROCEDURE  °· An intravenous (IV) catheter will be inserted into one of your veins. Medicine will be able to flow directly into your body through this catheter. You may be given medicine through this tube to help prevent pain and help you relax. °· The medical or dental procedure will be done. °AFTER THE PROCEDURE °· You will stay in a recovery area until the medicine has worn off. Your blood pressure and pulse will be checked.   °·  Depending on the procedure you had, you may be allowed to go home when you can tolerate liquids and your pain is under control. °Document Released: 05/25/2001 Document Revised: 09/04/2013 Document Reviewed: 05/07/2013 °ExitCare® Patient Information ©2015 [employer]. This information is not intended to replace advice given to you by your health care provider. Make sure you discuss any questions you have with your health care provider. ° °Bone Marrow Aspiration, Bone Marrow Biopsy °Care After °Read the instructions outlined below and refer to this sheet in the next few weeks. These discharge instructions provide you with general information on caring for yourself after you leave the hospital. Your caregiver may also give you specific instructions. While your treatment has been planned according to the most current medical practices available, unavoidable complications occasionally occur. If you have any problems or questions after discharge, call your caregiver. °FINDING OUT THE RESULTS OF YOUR TEST °Not all test results are available during your visit. If your test results are not back during the visit, make an appointment with   your caregiver to find out the results. Do not assume everything is normal if you have not heard from your caregiver or the medical facility. It is important for you to follow up on all of your test results.  °HOME CARE INSTRUCTIONS  °You have had sedation and may be sleepy or  dizzy. Your thinking may not be as clear as usual. For the next 24 hours: °· Only take over-the-counter or prescription medicines for pain, discomfort, and or fever as directed by your caregiver. °· Do not drink alcohol. °· Do not smoke. °· Do not drive. °· Do not make important legal decisions. °· Do not operate heavy machinery. °· Do not care for small children by yourself. °· Keep your dressing clean and dry. You may replace dressing with a bandage after 24 hours. °· You may take a bath or shower after 24 hours. °· Use an ice pack for 20 minutes every 2 hours while awake for pain as needed. °SEEK MEDICAL CARE IF:  °· There is redness, swelling, or increasing pain at the biopsy site. °· There is pus coming from the biopsy site. °· There is drainage from a biopsy site lasting longer than one day. °· An unexplained oral temperature above 102° F (38.9° C) develops. °SEEK IMMEDIATE MEDICAL CARE IF:  °· You develop a rash. °· You have difficulty breathing. °· You develop any reaction or side effects to medications given. °Document Released: 03/19/2005 Document Revised: 11/22/2011 Document Reviewed: 08/27/2008 °ExitCare® Patient Information ©2015 [employer]. This information is not intended to replace advice given to you by your health care provider. Make sure you discuss any questions you have with your health care provider. ° °

## 2014-10-21 NOTE — Progress Notes (Signed)
Hematology and Oncology Follow Up Visit  Shannon Parsons [id number] 1941-09-05 74 y.o. 10/21/2014   Principle Diagnosis:   Mantle cell lymphoma (MCIPI = 5)  Current Therapy:    Patient to start chemotherapy with RV-CAP     Interim History:  [name] is back for follow-up. We have done [facility] staging. She goes for a bone marrow test next week.  We did go ahead and get a PET scan on her. This did show disease in multiple lymph node groups. This is really no surprise.  Her echocardiogram shows an excellent LVEF of 60-65%.  She had a Port-A-Cath placed already.  Her LDH is 219. Her beta-2 microglobulin is 4.43.  According to the mantle cell international prognostic index, her score is 5 which would put her in an intermediate risk group.  She's eating okay. She has not lost anymore weight. She's had no fever. She's had some sweats.  She's had some pain in the left upper quadrant, which I feel is or spleen. She's not had any diarrhea. There is no dysuria. She's had no leg swelling.  She's had no headache. There is no neurological findings.  Medications:  Current outpatient prescriptions:  .  acetaminophen (TYLENOL) 325 MG tablet, Take 650 mg by mouth every 6 (six) hours as needed (Pain)., Disp: , Rfl:  .  allopurinol (ZYLOPRIM) 100 MG tablet, Take 1 tablet (100 mg total) by mouth daily., Disp: 28 tablet, Rfl: 0 .  ALPRAZolam (XANAX) 0.5 MG tablet, Take 1/2 to 1 tablet 3 x day  as needed for anxiety or sleep (Patient taking differently: Take 0.5 mg by mouth 3 (three) times daily as needed for anxiety or sleep. ), Disp: 90 tablet, Rfl: 3 .  Cholecalciferol (VITAMIN D) 2000 UNITS CAPS, Take 2,000-4,000 capsules by mouth every other day. Patient alternates 1 cap with 2 caps every other day., Disp: , Rfl:  .  famciclovir (FAMVIR) 500 MG tablet, Take 1 tablet (500 mg total) by mouth daily., Disp: 30 tablet, Rfl: 12 .  hyoscyamine (LEVSIN, ANASPAZ) 0.125 MG tablet, Take 1 to 2 tablets  every 4 hours as needed for nausea, cramping, bloating or diarrhea, Disp: 90 tablet, Rfl: 11 .  levothyroxine (SYNTHROID, LEVOTHROID) 50 MCG tablet, Take 50 mcg by mouth daily before breakfast., Disp: , Rfl:  .  lidocaine-prilocaine (EMLA) cream, Apply 1 application topically as needed. Apply to [facility] one hour before access., Disp: 30 g, Rfl: 3 .  Magnesium 200 MG TABS, Take 1 tablet by mouth 4 (four) times a week. , Disp: , Rfl:  .  ondansetron (ZOFRAN) 8 MG tablet, Take 1 pill twice a day for 4 days. Start the day after each chemotherapy treatment., Disp: 30 tablet, Rfl: 3 .  Probiotic Product (PROBIOTIC DAILY PO), Take 1 capsule by mouth daily., Disp: , Rfl:  .  QVAR 80 MCG/ACT inhaler, INHALE 2 PUFFS INTO THE LUNGS 2 TIMES DAILY. (Patient taking differently: INHALE 1 PUFFS INTO THE LUNGS 2 TIMES DAILY.), Disp: 8.7 g, Rfl: 6 .  Zinc 50 MG TABS, Take 25 mg by mouth 4 (four) times a week. , Disp: , Rfl:  No current facility-administered medications for this visit.  Facility-Administered Medications Ordered in Other Visits:  .  0.9 %  sodium chloride infusion, , Intravenous, Continuous, Hedy Jacob, PA-C, Last Rate: 75 mL/hr at 10/21/14 0950 .  fentaNYL (SUBLIMAZE) 0.05 MG/ML injection, , , ,  .  midazolam (VERSED) 2 MG/2ML injection, , , ,   Allergies:  Allergies  Allergen Reactions  . Actonel [Risedronate Sodium]     "made me choke"   . Fosamax [Alendronate]     "made me choke"  . Sulfa Antibiotics     Doesn't remember   . Tetracyclines & Related Rash    Past Medical History, Surgical history, Social history, and Family History were reviewed and updated.  Review of Systems: As above  Physical Exam:  vitals were not taken for this visit.  Well-developed and well-nourished white female. Head and neck exam shows no ocular or oral lesions. She has some bilateral possible cervical lymph nodes. None measure more than 2 cm. She has no palpable thyroid. Lungs are clear. Cardiac exam  regular rate and rhythm with no murmurs, rubs or bruits. Axillary exam shows bilateral axillary adenopathy. Abdomen is soft. She has decent bowel sounds. There is no fluid wave. Her spleen tip is just below the left costal margin. There is no hepatomegaly. Back exam shows no tenderness over the spine, ribs or hips. Extremities shows no clubbing, cyanosis or edema. She has good range of motion of her joints. She has good strength in her muscles. Neurological exam is nonfocal.  Lab Results  Component Value Date   WBC 15.6* 10/21/2014   HGB 12.7 10/21/2014   HCT 39.4 10/21/2014   MCV 85.3 10/21/2014   PLT 204 10/21/2014     Chemistry      Component Value Date/Time   NA 137 10/09/2014 1358   NA 138 09/10/2014 1459   K 4.0 10/09/2014 1358   K 4.3 09/10/2014 1459   CL 100 10/09/2014 1358   CL 102 09/10/2014 1459   CO2 28 10/09/2014 1358   CO2 27 09/10/2014 1459   BUN 11 10/09/2014 1358   BUN 13 09/10/2014 1459   CREATININE 0.5* 10/09/2014 1358   CREATININE 0.90 02/27/2014 1845      Component Value Date/Time   CALCIUM 8.9 10/09/2014 1358   CALCIUM 9.4 09/10/2014 1459   ALKPHOS 74 10/09/2014 1358   ALKPHOS 83 09/10/2014 1459   AST 43* 10/09/2014 1358   AST 39* 09/10/2014 1459   ALT 23 10/09/2014 1358   ALT 23 09/10/2014 1459   BILITOT 0.60 10/09/2014 1358   BILITOT 0.5 09/10/2014 1459         Impression and Plan: Shannon Parsons is 74 year old white female. She has mantle cell lymphoma. Again, by the [facility] she is an intermediate risk.  I just don't think she would be a good candidate for highly aggressive chemotherapy. I think that the hyperCVAD regimen and other intensive inpatient regimens might be low bit tough on her.  I think with a recent report that used a variation of R-CHOP in which vincristine was replaced by Velcade, I think this would be very reasonable. I know that it is more myelosuppressive. However, I think that she would be able to tolerate this.  I spent about  an hour with she and her husband. I went over the side effects of treatment. I think she will need Neulasta.  I gave her prescriptions for allopurinol. I gave her prescription for Famvir. I think these are very important.  When she comes in for treatment, I will give her a physician for Levaquin or ciprofloxacin for antibiotic prophylaxis.  I answered all their questions. I still they've that she might be a transplant candidate. I think it is borderline however.  I will do 2 cycles of treatment and then we will repeat her PET scan. I think  clinically, we will be a tell she is responding by resolution of palpable lymph nodes.  We will get started with treatment in a week. I will then plan to get her back to be seen a few weeks later.  We will have to be very diligent with monitoring her blood counts.   Volanda Napoleon, MD 2/8/20165:22 PM

## 2014-10-21 NOTE — H&P (Signed)
  Chief Complaint: "I'm having a bone marrow biopsy"  Referring Physician(s): Ennever,Peter R  History of Present Illness: Shannon Parsons is a 73 y.o. female with history of recently diagnosed mantle cell lymphoma who presents today for bone marrow biopsy. She just had port placed last week and is doing well from that. She has chemo teaching class tomorrow and her first chemo session on Wednesday. She is here today for BM biopsy. PMHx reviewed, no new c/o since we last saw her last week. Has been NPO this am.   Past Medical History  Diagnosis Date  . OSA (obstructive sleep apnea)   . Hiatal hernia   . IBS (irritable bowel syndrome)   . Fibrocystic breast disease   . Asthma   . Diverticulosis   . Adenomatous colon polyp 1994  . Internal hemorrhoids   . Melanoma     Facial  . Hyperlipidemia   . Hypertension   . GERD (gastroesophageal reflux disease)   . Osteoporosis   . Osteoarthritis   . Vitamin D deficiency   . Prediabetes   . Unspecified hypothyroidism   . Family history of ischemic heart disease   . Mantle cell lymphoma 10/18/2014    Past Surgical History  Procedure Laterality Date  . Appendectomy    . Tonsillectomy    . Bunionectomy    . Knee arthroscopy    . Umbilical hernia repair      Allergies: Actonel; Fosamax; Sulfa antibiotics; and Tetracyclines & related  Medications: Prior to Admission medications   Medication Sig Start Date End Date Taking? Authorizing Provider  ALPRAZolam (XANAX) 0.5 MG tablet Take 1/2 to 1 tablet 3 x day  as needed for anxiety or sleep Patient taking differently: Take 0.5 mg by mouth 3 (three) times daily as needed for anxiety or sleep.  07/15/14 01/13/15  William McKeown, MD  Cholecalciferol (VITAMIN D) 2000 UNITS CAPS Take 2,000-4,000 capsules by mouth every other day. Patient alternates 1 cap with 2 caps every other day.    Historical Provider, MD  hyoscyamine (LEVSIN, ANASPAZ) 0.125 MG tablet Take 1 to 2 tablets every 4 hours  as needed for nausea, cramping, bloating or diarrhea 08/19/14   William McKeown, MD  levothyroxine (SYNTHROID, LEVOTHROID) 50 MCG tablet Take 50 mcg by mouth daily before breakfast.    Historical Provider, MD  Magnesium 200 MG TABS Take 1 tablet by mouth 4 (four) times a week.     Historical Provider, MD  QVAR 80 MCG/ACT inhaler INHALE 2 PUFFS INTO THE LUNGS 2 TIMES DAILY. 07/08/14   Keith M Clance, MD  Zinc 50 MG TABS Take 25 mg by mouth daily.    Historical Provider, MD    Family History  Problem Relation Age of Onset  . Emphysema Father   . Heart disease Father   . Heart attack Father 71    Died  . Heart disease Brother   . Rheum arthritis Sister   . Hypertension Mother   . Thyroid disease Mother   . Colon cancer Neg Hx     History   Social History  . Marital Status: Married    Spouse Name: N/A    Number of Children: N/A  . Years of Education: N/A   Occupational History  . retired from office work    Social History Main Topics  . Smoking status: Never Smoker   . Smokeless tobacco: Never Used     Comment: Never Used Tobacco  . Alcohol Use: No  .   Drug Use: No  . Sexual Activity: None   Other Topics Concern  . None   Social History Narrative     Review of Systems  Constitutional: Positive for unexpected weight change. Negative for fever and chills.  Respiratory: Negative for cough and shortness of breath.   Cardiovascular: Negative for chest pain.  Gastrointestinal: Positive for abdominal pain. Negative for nausea, vomiting and blood in stool.  Genitourinary: Negative for dysuria and hematuria.  Musculoskeletal:       Occ back pain  Neurological: Positive for tremors. Negative for headaches.  Psychiatric/Behavioral: The patient is nervous/anxious.     Vital Signs: BP 126/55 mmHg  Pulse 77  Temp(Src) 98.2 F (36.8 C) (Oral)  Resp 14  SpO2 99%  LMP 09/14/2014  Physical Exam  Constitutional: She is oriented to person, place, and time. She appears  well-developed and well-nourished.  Cardiovascular: Normal rate and regular rhythm.   Pulmonary/Chest: Effort normal and breath sounds normal.  Abdominal: Soft. Bowel sounds are normal.  Mild gen tenderness  Musculoskeletal: Normal range of motion. She exhibits no edema.  Lymphadenopathy:    She has cervical adenopathy.  Neurological: She is alert and oriented to person, place, and time.     Labs:  CBC:  Recent Labs  10/09/14 1358 10/14/14 1225 10/18/14 1141 10/21/14 0946  WBC 13.6* 14.4* 13.2* 15.6*  HGB 12.8 13.7 13.7 12.7  HCT 39.8 41.7 42.4 39.4  PLT 217 215 216 204    COAGS:  Recent Labs  09/30/14 1130 10/14/14 1225  INR 1.07 1.08  APTT 25  --     BMP:  Recent Labs  12/20/13 1450  02/27/14 0536 02/27/14 1845 07/15/14 1524 09/10/14 1459 10/09/14 1358  NA 135  --  141  --  139 138 137  K 4.6  --  3.9  --  3.8 4.3 4.0  CL 101  --  103  --  103 102 100  CO2 29  --  26  --  27 27 28  GLUCOSE 81  --  137*  --  79 84 80  BUN 13  --  15  --  11 13 11  CALCIUM 9.5  --  9.7  --  9.3 9.4 8.9  CREATININE 0.76  < > 0.84 0.90 0.79 0.84 0.5*  GFRNONAA 80  --  69* 63* 76  --   --   GFRAA >89  --  80* 73* 88  --   --   < > = values in this interval not displayed.  LIVER FUNCTION TESTS:  Recent Labs  02/27/14 0536 04/23/14 1429 07/15/14 1524 09/10/14 1459 10/09/14 1358  BILITOT 0.3 0.9 0.5 0.5 0.60  AST 34 25 43* 39* 43*  ALT 17 19 26 23 23  ALKPHOS 67 55 80 83 74  PROT 7.2 6.8 6.5 6.5 6.9  ALBUMIN 3.7 3.9 4.1 3.9  --     TUMOR MARKERS: No results for input(s): AFPTM, CEA, CA199, CHROMGRNA in the last 8760 hours.  Assessment and Plan: Shannon Parsons is a 73 y.o. female with history of recently diagnosed mantle cell lymphoma who presents today for bone marrow biopsy. Details/risks of procedure d/w pt/husband with their understanding and consent.    Signed: ,  10/21/2014, 10:20 AM      

## 2014-10-21 NOTE — Procedures (Signed)
Procedure:  CT guided bone marrow biopsy Findings:  Right iliac bone marrow biopsy performed.  No complications.

## 2014-10-22 ENCOUNTER — Other Ambulatory Visit: Payer: Medicare Other

## 2014-10-22 ENCOUNTER — Ambulatory Visit: Payer: Self-pay | Admitting: Physician Assistant

## 2014-10-22 ENCOUNTER — Encounter: Payer: Self-pay | Admitting: *Deleted

## 2014-10-23 ENCOUNTER — Ambulatory Visit (HOSPITAL_COMMUNITY): Payer: Medicare Other

## 2014-10-23 ENCOUNTER — Ambulatory Visit (HOSPITAL_BASED_OUTPATIENT_CLINIC_OR_DEPARTMENT_OTHER): Payer: Medicare Other

## 2014-10-23 ENCOUNTER — Telehealth: Payer: Self-pay | Admitting: Hematology & Oncology

## 2014-10-23 ENCOUNTER — Encounter: Payer: Self-pay | Admitting: Hematology & Oncology

## 2014-10-23 DIAGNOSIS — Z5112 Encounter for antineoplastic immunotherapy: Secondary | ICD-10-CM

## 2014-10-23 DIAGNOSIS — C8310 Mantle cell lymphoma, unspecified site: Secondary | ICD-10-CM

## 2014-10-23 DIAGNOSIS — Z5111 Encounter for antineoplastic chemotherapy: Secondary | ICD-10-CM

## 2014-10-23 MED ORDER — DEXAMETHASONE SODIUM PHOSPHATE 20 MG/5ML IJ SOLN
INTRAMUSCULAR | Status: AC
  Filled 2014-10-23: qty 5

## 2014-10-23 MED ORDER — DIPHENHYDRAMINE HCL 25 MG PO CAPS
ORAL_CAPSULE | ORAL | Status: AC
  Filled 2014-10-23: qty 2

## 2014-10-23 MED ORDER — DOXORUBICIN HCL CHEMO IV INJECTION 2 MG/ML
45.0000 mg/m2 | Freq: Once | INTRAVENOUS | Status: AC
Start: 2014-10-23 — End: 2014-10-23
  Administered 2014-10-23: 72 mg via INTRAVENOUS
  Filled 2014-10-23: qty 36

## 2014-10-23 MED ORDER — LORAZEPAM 0.5 MG PO TABS: 0.5000 mg | ORAL_TABLET | Freq: Four times a day (QID) | ORAL | Status: DC | PRN

## 2014-10-23 MED ORDER — ONDANSETRON 16 MG/50ML IVPB (CHCC)
16.0000 mg | Freq: Once | INTRAVENOUS | Status: AC
  Administered 2014-10-23: 16 mg via INTRAVENOUS

## 2014-10-23 MED ORDER — PREDNISONE 20 MG PO TABS: 40.0000 mg/m2 | ORAL_TABLET | Freq: Every day | ORAL | Status: DC

## 2014-10-23 MED ORDER — PROCHLORPERAZINE MALEATE 10 MG PO TABS: 10.0000 mg | ORAL_TABLET | Freq: Four times a day (QID) | ORAL | Status: DC | PRN

## 2014-10-23 MED ORDER — SODIUM CHLORIDE 0.9 % IJ SOLN
10.0000 mL | INTRAMUSCULAR | Status: DC | PRN
  Administered 2014-10-23: 10 mL
  Filled 2014-10-23: qty 10

## 2014-10-23 MED ORDER — CYCLOPHOSPHAMIDE CHEMO INJECTION 1 GM
700.0000 mg/m2 | Freq: Once | INTRAMUSCULAR | Status: AC
  Administered 2014-10-23: 1120 mg via INTRAVENOUS
  Filled 2014-10-23: qty 56

## 2014-10-23 MED ORDER — DIPHENHYDRAMINE HCL 25 MG PO CAPS
50.0000 mg | ORAL_CAPSULE | Freq: Once | ORAL | Status: AC
  Administered 2014-10-23: 50 mg via ORAL

## 2014-10-23 MED ORDER — BORTEZOMIB CHEMO SQ INJECTION 3.5 MG (2.5MG/ML)
1.3000 mg/m2 | Freq: Once | INTRAMUSCULAR | Status: AC
  Administered 2014-10-23: 2 mg via SUBCUTANEOUS
  Filled 2014-10-23: qty 2

## 2014-10-23 MED ORDER — HEPARIN SOD (PORK) LOCK FLUSH 100 UNIT/ML IV SOLN
500.0000 [IU] | Freq: Once | INTRAVENOUS | Status: AC | PRN
  Administered 2014-10-23: 500 [IU]
  Filled 2014-10-23: qty 5

## 2014-10-23 MED ORDER — ONDANSETRON 16 MG/50ML IVPB (CHCC)
INTRAVENOUS | Status: AC
  Filled 2014-10-23: qty 16

## 2014-10-23 MED ORDER — ACETAMINOPHEN 325 MG PO TABS
ORAL_TABLET | ORAL | Status: AC
  Filled 2014-10-23: qty 2

## 2014-10-23 MED ORDER — RITUXIMAB CHEMO INJECTION 500 MG/50ML
375.0000 mg/m2 | Freq: Once | INTRAVENOUS | Status: AC
  Administered 2014-10-23: 600 mg via INTRAVENOUS
  Filled 2014-10-23: qty 60

## 2014-10-23 MED ORDER — ACETAMINOPHEN 325 MG PO TABS
650.0000 mg | ORAL_TABLET | Freq: Once | ORAL | Status: AC
  Administered 2014-10-23: 650 mg via ORAL

## 2014-10-23 MED ORDER — SODIUM CHLORIDE 0.9 % IV SOLN
Freq: Once | INTRAVENOUS | Status: AC
  Administered 2014-10-23: 09:00:00 via INTRAVENOUS

## 2014-10-23 MED ORDER — DEXAMETHASONE SODIUM PHOSPHATE 20 MG/5ML IJ SOLN
20.0000 mg | Freq: Once | INTRAMUSCULAR | Status: AC
  Administered 2014-10-23: 20 mg via INTRAVENOUS

## 2014-10-23 NOTE — Patient Instructions (Signed)
[facility] Discharge Instructions for Patients Receiving Chemotherapy  Today you received the following chemotherapy agents Rituxan, Cytoxan, Adriamycin, and Velcade.  To help prevent nausea and vomiting after your treatment, we encourage you to take your nausea medications as directed on the bottle.   If you develop nausea and vomiting that is not controlled by your nausea medication, call the clinic.   BELOW ARE SYMPTOMS THAT SHOULD BE REPORTED IMMEDIATELY:  *FEVER GREATER THAN 100.5 F  *CHILLS WITH OR WITHOUT FEVER  NAUSEA AND VOMITING THAT IS NOT CONTROLLED WITH YOUR NAUSEA MEDICATION  *UNUSUAL SHORTNESS OF BREATH  *UNUSUAL BRUISING OR BLEEDING  TENDERNESS IN MOUTH AND THROAT WITH OR WITHOUT PRESENCE OF ULCERS  *URINARY PROBLEMS  *BOWEL PROBLEMS  UNUSUAL RASH Items with * indicate a potential emergency and should be followed up as soon as possible.  Feel free to call the clinic you have any questions or concerns. The clinic phone number is [phone]. Bortezomib injection What is this medicine? BORTEZOMIB (bor TEZ oh mib) is a chemotherapy drug. It slows the growth of cancer cells. This medicine is used to treat multiple myeloma, and certain lymphomas, such as mantle-cell lymphoma. This medicine may be used for other purposes; ask your health care provider or pharmacist if you have questions. COMMON BRAND NAME(S): Velcade What should I tell my health care provider before I take this medicine? They need to know if you have any of these conditions: -diabetes -heart disease -irregular heartbeat -liver disease -on hemodialysis -low blood counts, like low white blood cells, platelets, or hemoglobin -peripheral neuropathy -taking medicine for blood pressure -an unusual or allergic reaction to bortezomib, mannitol, boron, other medicines, foods, dyes, or preservatives -pregnant or trying to get pregnant -breast-feeding How should I use this  medicine? This medicine is for injection into a vein or for injection under the skin. It is given by a health care professional in a hospital or clinic setting. Talk to your pediatrician regarding the use of this medicine in children. Special care may be needed. Overdosage: If you think you have taken too much of this medicine contact a poison control center or emergency room at once. NOTE: This medicine is only for you. Do not share this medicine with others. What if I miss a dose? It is important not to miss your dose. Call your doctor or health care professional if you are unable to keep an appointment. What may interact with this medicine? This medicine may interact with the following medications: -ketoconazole -rifampin -ritonavir -St. John's Wort This list may not describe all possible interactions. Give your health care provider a list of all the medicines, herbs, non-prescription drugs, or dietary supplements you use. Also tell them if you smoke, drink alcohol, or use illegal drugs. Some items may interact with your medicine. What should I watch for while using this medicine? Visit your doctor for checks on your progress. This drug may make you feel generally unwell. This is not uncommon, as chemotherapy can affect healthy cells as well as cancer cells. Report any side effects. Continue your course of treatment even though you feel ill unless your doctor tells you to stop. You may get drowsy or dizzy. Do not drive, use machinery, or do anything that needs mental alertness until you know how this medicine affects you. Do not stand or sit up quickly, especially if you are an older patient. This reduces the risk of dizzy or fainting spells. In some cases, you may be given additional  medicines to help with side effects. Follow all directions for their use. Call your doctor or health care professional for advice if you get a fever, chills or sore throat, or other symptoms of a cold or flu. Do  not treat yourself. This drug decreases your body's ability to fight infections. Try to avoid being around people who are sick. This medicine may increase your risk to bruise or bleed. Call your doctor or health care professional if you notice any unusual bleeding. You may need blood work done while you are taking this medicine. In some patients, this medicine may cause a serious brain infection that may cause death. If you have any problems seeing, thinking, speaking, walking, or standing, tell your doctor right away. If you cannot reach your doctor, urgently seek other source of medical care. Do not become pregnant while taking this medicine. Women should inform their doctor if they wish to become pregnant or think they might be pregnant. There is a potential for serious side effects to an unborn child. Talk to your health care professional or pharmacist for more information. Do not breast-feed an infant while taking this medicine. Check with your doctor or health care professional if you get an attack of severe diarrhea, nausea and vomiting, or if you sweat a lot. The loss of too much body fluid can make it dangerous for you to take this medicine. What side effects may I notice from receiving this medicine? Side effects that you should report to your doctor or health care professional as soon as possible: -allergic reactions like skin rash, itching or hives, swelling of the face, lips, or tongue -breathing problems -changes in hearing -changes in vision -fast, irregular heartbeat -feeling faint or lightheaded, falls -pain, tingling, numbness in the hands or feet -right upper belly pain -seizures -swelling of the ankles, feet, hands -unusual bleeding or bruising -unusually weak or tired -vomiting -yellowing of the eyes or skin Side effects that usually do not require medical attention (report to your doctor or health care professional if they continue or are bothersome): -changes in emotions  or moods -constipation -diarrhea -loss of appetite -headache -irritation at site where injected -nausea This list may not describe all possible side effects. Call your doctor for medical advice about side effects. You may report side effects to FDA at 1-800-FDA-1088. Where should I keep my medicine? This drug is given in a hospital or clinic and will not be stored at home. NOTE: This sheet is a summary. It may not cover all possible information. If you have questions about this medicine, talk to your doctor, pharmacist, or health care provider.  2015, [employer] Standard. (2013-06-25 12:46:32) Doxorubicin injection What is this medicine? DOXORUBICIN (dox oh ROO bi sin) is a chemotherapy drug. It is used to treat many kinds of cancer like Hodgkin's disease, leukemia, non-Hodgkin's lymphoma, neuroblastoma, sarcoma, and Wilms' tumor. It is also used to treat bladder cancer, breast cancer, lung cancer, ovarian cancer, stomach cancer, and thyroid cancer. This medicine may be used for other purposes; ask your health care provider or pharmacist if you have questions. COMMON BRAND NAME(S): Adriamycin, Adriamycin PFS, Adriamycin RDF, Rubex What should I tell my health care provider before I take this medicine? They need to know if you have any of these conditions: -blood disorders -heart disease, recent heart attack -infection (especially a virus infection such as chickenpox, cold sores, or herpes) -irregular heartbeat -liver disease -recent or ongoing radiation therapy -an unusual or allergic reaction to doxorubicin, other chemotherapy   agents, other medicines, foods, dyes, or preservatives -pregnant or trying to get pregnant -breast-feeding How should I use this medicine? This drug is given as an infusion into a vein. It is administered in a hospital or clinic by a specially trained health care professional. If you have pain, swelling, burning or any unusual feeling around the site of your  injection, tell your health care professional right away. Talk to your pediatrician regarding the use of this medicine in children. Special care may be needed. Overdosage: If you think you have taken too much of this medicine contact a poison control center or emergency room at once. NOTE: This medicine is only for you. Do not share this medicine with others. What if I miss a dose? It is important not to miss your dose. Call your doctor or health care professional if you are unable to keep an appointment. What may interact with this medicine? Do not take this medicine with any of the following medications: -cisapride -droperidol -halofantrine -pimozide -zidovudine This medicine may also interact with the following medications: -chloroquine -chlorpromazine -clarithromycin -cyclophosphamide -cyclosporine -erythromycin -medicines for depression, anxiety, or psychotic disturbances -medicines for irregular heart beat like amiodarone, bepridil, dofetilide, encainide, flecainide, propafenone, quinidine -medicines for seizures like ethotoin, fosphenytoin, phenytoin -medicines for nausea, vomiting like dolasetron, ondansetron, palonosetron -medicines to increase blood counts like filgrastim, pegfilgrastim, sargramostim -methadone -methotrexate -pentamidine -progesterone -vaccines -verapamil Talk to your doctor or health care professional before taking any of these medicines: -acetaminophen -aspirin -ibuprofen -ketoprofen -naproxen This list may not describe all possible interactions. Give your health care provider a list of all the medicines, herbs, non-prescription drugs, or dietary supplements you use. Also tell them if you smoke, drink alcohol, or use illegal drugs. Some items may interact with your medicine. What should I watch for while using this medicine? Your condition will be monitored carefully while you are receiving this medicine. You will need important blood work done  while you are taking this medicine. This drug may make you feel generally unwell. This is not uncommon, as chemotherapy can affect healthy cells as well as cancer cells. Report any side effects. Continue your course of treatment even though you feel ill unless your doctor tells you to stop. Your urine may turn red for a few days after your dose. This is not blood. If your urine is dark or brown, call your doctor. In some cases, you may be given additional medicines to help with side effects. Follow all directions for their use. Call your doctor or health care professional for advice if you get a fever, chills or sore throat, or other symptoms of a cold or flu. Do not treat yourself. This drug decreases your body's ability to fight infections. Try to avoid being around people who are sick. This medicine may increase your risk to bruise or bleed. Call your doctor or health care professional if you notice any unusual bleeding. Be careful brushing and flossing your teeth or using a toothpick because you may get an infection or bleed more easily. If you have any dental work done, tell your dentist you are receiving this medicine. Avoid taking products that contain aspirin, acetaminophen, ibuprofen, naproxen, or ketoprofen unless instructed by your doctor. These medicines may hide a fever. Men and women of childbearing age should use effective birth control methods while using taking this medicine. Do not become pregnant while taking this medicine. There is a potential for serious side effects to an unborn child. Talk to your health care professional  or pharmacist for more information. Do not breast-feed an infant while taking this medicine. Do not let others touch your urine or other body fluids for 5 days after each treatment with this medicine. Caregivers should wear latex gloves to avoid touching body fluids during this time. There is a maximum amount of this medicine you should receive throughout your life.  The amount depends on the medical condition being treated and your overall health. Your doctor will watch how much of this medicine you receive in your lifetime. Tell your doctor if you have taken this medicine before. What side effects may I notice from receiving this medicine? Side effects that you should report to your doctor or health care professional as soon as possible: -allergic reactions like skin rash, itching or hives, swelling of the face, lips, or tongue -low blood counts - this medicine may decrease the number of white blood cells, red blood cells and platelets. You may be at increased risk for infections and bleeding. -signs of infection - fever or chills, cough, sore throat, pain or difficulty passing urine -signs of decreased platelets or bleeding - bruising, pinpoint red spots on the skin, black, tarry stools, blood in the urine -signs of decreased red blood cells - unusually weak or tired, fainting spells, lightheadedness -breathing problems -chest pain -fast, irregular heartbeat -mouth sores -nausea, vomiting -pain, swelling, redness at site where injected -pain, tingling, numbness in the hands or feet -swelling of ankles, feet, or hands -unusual bleeding or bruising Side effects that usually do not require medical attention (report to your doctor or health care professional if they continue or are bothersome): -diarrhea -facial flushing -hair loss -loss of appetite -missed menstrual periods -nail discoloration or damage -red or watery eyes -red colored urine -stomach upset This list may not describe all possible side effects. Call your doctor for medical advice about side effects. You may report side effects to FDA at 1-800-FDA-1088. Where should I keep my medicine? This drug is given in a hospital or clinic and will not be stored at home. NOTE: This sheet is a summary. It may not cover all possible information. If you have questions about this medicine, talk to  your doctor, pharmacist, or health care provider.  2015, [employer] Standard. (2012-12-26 09:54:34) Rituximab injection What is this medicine? RITUXIMAB (ri TUX i mab) is a monoclonal antibody. This medicine changes the way the body's immune system works. It is used commonly to treat non-Hodgkin's lymphoma and other conditions. In cancer cells, this drug targets a specific protein within cancer cells and stops the cancer cells from growing. It is also used to treat rhuematoid arthritis (RA). In RA, this medicine slow the inflammatory process and help reduce joint pain and swelling. This medicine is often used with other cancer or arthritis medications. This medicine may be used for other purposes; ask your health care provider or pharmacist if you have questions. COMMON BRAND NAME(S): Rituxan What should I tell my health care provider before I take this medicine? They need to know if you have any of these conditions: -blood disorders -heart disease -history of hepatitis B -infection (especially a virus infection such as chickenpox, cold sores, or herpes) -irregular heartbeat -kidney disease -lung or breathing disease, like asthma -lupus -an unusual or allergic reaction to rituximab, mouse proteins, other medicines, foods, dyes, or preservatives -pregnant or trying to get pregnant -breast-feeding How should I use this medicine? This medicine is for infusion into a vein. It is administered in a hospital or clinic   by a specially trained health care professional. A special MedGuide will be given to you by the pharmacist with each prescription and refill. Be sure to read this information carefully each time. Talk to your pediatrician regarding the use of this medicine in children. This medicine is not approved for use in children. Overdosage: If you think you have taken too much of this medicine contact a poison control center or emergency room at once. NOTE: This medicine is only for you. Do  not share this medicine with others. What if I miss a dose? It is important not to miss a dose. Call your doctor or health care professional if you are unable to keep an appointment. What may interact with this medicine? -cisplatin -medicines for blood pressure -some other medicines for arthritis -vaccines This list may not describe all possible interactions. Give your health care provider a list of all the medicines, herbs, non-prescription drugs, or dietary supplements you use. Also tell them if you smoke, drink alcohol, or use illegal drugs. Some items may interact with your medicine. What should I watch for while using this medicine? Report any side effects that you notice during your treatment right away, such as changes in your breathing, fever, chills, dizziness or lightheadedness. These effects are more common with the first dose. Visit your prescriber or health care professional for checks on your progress. You will need to have regular blood work. Report any other side effects. The side effects of this medicine can continue after you finish your treatment. Continue your course of treatment even though you feel ill unless your doctor tells you to stop. Call your doctor or health care professional for advice if you get a fever, chills or sore throat, or other symptoms of a cold or flu. Do not treat yourself. This drug decreases your body's ability to fight infections. Try to avoid being around people who are sick. This medicine may increase your risk to bruise or bleed. Call your doctor or health care professional if you notice any unusual bleeding. Be careful brushing and flossing your teeth or using a toothpick because you may get an infection or bleed more easily. If you have any dental work done, tell your dentist you are receiving this medicine. Avoid taking products that contain aspirin, acetaminophen, ibuprofen, naproxen, or ketoprofen unless instructed by your doctor. These medicines  may hide a fever. Do not become pregnant while taking this medicine. Women should inform their doctor if they wish to become pregnant or think they might be pregnant. There is a potential for serious side effects to an unborn child. Talk to your health care professional or pharmacist for more information. Do not breast-feed an infant while taking this medicine. What side effects may I notice from receiving this medicine? Side effects that you should report to your doctor or health care professional as soon as possible: -allergic reactions like skin rash, itching or hives, swelling of the face, lips, or tongue -low blood counts - this medicine may decrease the number of white blood cells, red blood cells and platelets. You may be at increased risk for infections and bleeding. -signs of infection - fever or chills, cough, sore throat, pain or difficulty passing urine -signs of decreased platelets or bleeding - bruising, pinpoint red spots on the skin, black, tarry stools, blood in the urine -signs of decreased red blood cells - unusually weak or tired, fainting spells, lightheadedness -breathing problems -confused, not responsive -chest pain -fast, irregular heartbeat -feeling faint or lightheaded,   falls -mouth sores -redness, blistering, peeling or loosening of the skin, including inside the mouth -stomach pain -swelling of the ankles, feet, or hands -trouble passing urine or change in the amount of urine Side effects that usually do not require medical attention (report to your doctor or other health care professional if they continue or are bothersome): -anxiety -headache -loss of appetite -muscle aches -nausea -night sweats This list may not describe all possible side effects. Call your doctor for medical advice about side effects. You may report side effects to FDA at 1-800-FDA-1088. Where should I keep my medicine? This drug is given in a hospital or clinic and will not be stored at  home. NOTE: This sheet is a summary. It may not cover all possible information. If you have questions about this medicine, talk to your doctor, pharmacist, or health care provider.  2015, [employer] Standard. (2008-04-29 14:04:59) Cyclophosphamide injection What is this medicine? CYCLOPHOSPHAMIDE (sye kloe FOSS fa mide) is a chemotherapy drug. It slows the growth of cancer cells. This medicine is used to treat many types of cancer like lymphoma, myeloma, leukemia, breast cancer, and ovarian cancer, to name a few. This medicine may be used for other purposes; ask your health care provider or pharmacist if you have questions. COMMON BRAND NAME(S): Cytoxan, Neosar What should I tell my health care provider before I take this medicine? They need to know if you have any of these conditions: -blood disorders -history of other chemotherapy -infection -kidney disease -liver disease -recent or ongoing radiation therapy -tumors in the bone marrow -an unusual or allergic reaction to cyclophosphamide, other chemotherapy, other medicines, foods, dyes, or preservatives -pregnant or trying to get pregnant -breast-feeding How should I use this medicine? This drug is usually given as an injection into a vein or muscle or by infusion into a vein. It is administered in a hospital or clinic by a specially trained health care professional. Talk to your pediatrician regarding the use of this medicine in children. Special care may be needed. Overdosage: If you think you have taken too much of this medicine contact a poison control center or emergency room at once. NOTE: This medicine is only for you. Do not share this medicine with others. What if I miss a dose? It is important not to miss your dose. Call your doctor or health care professional if you are unable to keep an appointment. What may interact with this medicine? This medicine may interact with the following  medications: -amiodarone -amphotericin B -azathioprine -certain antiviral medicines for HIV or AIDS such as protease inhibitors (e.g., indinavir, ritonavir) and zidovudine -certain blood pressure medications such as benazepril, captopril, enalapril, fosinopril, lisinopril, moexipril, monopril, perindopril, quinapril, ramipril, trandolapril -certain cancer medications such as anthracyclines (e.g., daunorubicin, doxorubicin), busulfan, cytarabine, paclitaxel, pentostatin, tamoxifen, trastuzumab -certain diuretics such as chlorothiazide, chlorthalidone, hydrochlorothiazide, indapamide, metolazone -certain medicines that treat or prevent blood clots like warfarin -certain muscle relaxants such as succinylcholine -cyclosporine -etanercept -indomethacin -medicines to increase blood counts like filgrastim, pegfilgrastim, sargramostim -medicines used as general anesthesia -metronidazole -natalizumab This list may not describe all possible interactions. Give your health care provider a list of all the medicines, herbs, non-prescription drugs, or dietary supplements you use. Also tell them if you smoke, drink alcohol, or use illegal drugs. Some items may interact with your medicine. What should I watch for while using this medicine? Visit your doctor for checks on your progress. This drug may make you feel generally unwell. This is not uncommon, as chemotherapy   can affect healthy cells as well as cancer cells. Report any side effects. Continue your course of treatment even though you feel ill unless your doctor tells you to stop. Drink water or other fluids as directed. Urinate often, even at night. In some cases, you may be given additional medicines to help with side effects. Follow all directions for their use. Call your doctor or health care professional for advice if you get a fever, chills or sore throat, or other symptoms of a cold or flu. Do not treat yourself. This drug decreases your body's  ability to fight infections. Try to avoid being around people who are sick. This medicine may increase your risk to bruise or bleed. Call your doctor or health care professional if you notice any unusual bleeding. Be careful brushing and flossing your teeth or using a toothpick because you may get an infection or bleed more easily. If you have any dental work done, tell your dentist you are receiving this medicine. You may get drowsy or dizzy. Do not drive, use machinery, or do anything that needs mental alertness until you know how this medicine affects you. Do not become pregnant while taking this medicine or for 1 year after stopping it. Women should inform their doctor if they wish to become pregnant or think they might be pregnant. Men should not father a child while taking this medicine and for 4 months after stopping it. There is a potential for serious side effects to an unborn child. Talk to your health care professional or pharmacist for more information. Do not breast-feed an infant while taking this medicine. This medicine may interfere with the ability to have a child. This medicine has caused ovarian failure in some women. This medicine has caused reduced sperm counts in some men. You should talk with your doctor or health care professional if you are concerned about your fertility. If you are going to have surgery, tell your doctor or health care professional that you have taken this medicine. What side effects may I notice from receiving this medicine? Side effects that you should report to your doctor or health care professional as soon as possible: -allergic reactions like skin rash, itching or hives, swelling of the face, lips, or tongue -low blood counts - this medicine may decrease the number of white blood cells, red blood cells and platelets. You may be at increased risk for infections and bleeding. -signs of infection - fever or chills, cough, sore throat, pain or difficulty  passing urine -signs of decreased platelets or bleeding - bruising, pinpoint red spots on the skin, black, tarry stools, blood in the urine -signs of decreased red blood cells - unusually weak or tired, fainting spells, lightheadedness -breathing problems -dark urine -dizziness -palpitations -swelling of the ankles, feet, hands -trouble passing urine or change in the amount of urine -weight gain -yellowing of the eyes or skin Side effects that usually do not require medical attention (report to your doctor or health care professional if they continue or are bothersome): -changes in nail or skin color -hair loss -missed menstrual periods -mouth sores -nausea, vomiting This list may not describe all possible side effects. Call your doctor for medical advice about side effects. You may report side effects to FDA at 1-800-FDA-1088. Where should I keep my medicine? This drug is given in a hospital or clinic and will not be stored at home. NOTE: This sheet is a summary. It may not cover all possible information. If you have questions   about this medicine, talk to your doctor, pharmacist, or health care provider.  2015, Elsevier/Gold Standard. (2012-07-14 16:22:58)     

## 2014-10-23 NOTE — Telephone Encounter (Signed)
J9000 Adriamycin Approved Auth: [id number] Valid: 10/23/2014 -            09/13/2015        J3490 acetaminophen 325 MG Tabs          Q0163 diphenhydrAMINE 25 mg Caps          J2405 ondansetron 16 MG/50ML Soln          J1100 Dexamethasone Sodium Phosphate 20 MG/5ML Soln         J9041 Velcade          J7050 sodium chloride 0.9 % Soln 250 mL Flex Cont            J9070    Cytoxan                                  J9310 riTUXimab 100 MG/10ML Soln 10 mL Vial Approved Auth: [id number] Valid: 10/23/2014 -            09/13/2015        CHEMO PENDING w [facility] buy and bill program Pt's [place] insurance does not cover chemo iv  Clinical Nurse Mateo Flow: [phone] P: [phone] F: [phone]

## 2014-10-24 ENCOUNTER — Encounter: Payer: Self-pay | Admitting: *Deleted

## 2014-10-24 ENCOUNTER — Ambulatory Visit: Payer: Medicare Other

## 2014-10-24 NOTE — Progress Notes (Signed)
Was asked to followup on Neulasta plans for patient from desk nurse yesterday.  Spoke with Dr. Marin Olp and he wants to wait to see what patients counts do this cycle and then possibly start Neulasta if needed next cycle. Patient aware.

## 2014-10-25 LAB — CHROMOSOME ANALYSIS, BONE MARROW

## 2014-10-28 ENCOUNTER — Other Ambulatory Visit: Payer: Self-pay | Admitting: *Deleted

## 2014-10-28 ENCOUNTER — Telehealth: Payer: Self-pay | Admitting: *Deleted

## 2014-10-28 NOTE — Telephone Encounter (Signed)
Patient wants okay from Dr Marin Olp to take probiotic while on treatment. Dr Marin Olp is okay with this. Patient aware.

## 2014-10-29 ENCOUNTER — Other Ambulatory Visit: Payer: Self-pay | Admitting: *Deleted

## 2014-10-29 DIAGNOSIS — C8310 Mantle cell lymphoma, unspecified site: Secondary | ICD-10-CM

## 2014-10-30 ENCOUNTER — Other Ambulatory Visit (HOSPITAL_BASED_OUTPATIENT_CLINIC_OR_DEPARTMENT_OTHER): Payer: Medicare Other | Admitting: Lab

## 2014-10-30 ENCOUNTER — Ambulatory Visit (HOSPITAL_BASED_OUTPATIENT_CLINIC_OR_DEPARTMENT_OTHER): Payer: Medicare Other

## 2014-10-30 DIAGNOSIS — C8310 Mantle cell lymphoma, unspecified site: Secondary | ICD-10-CM

## 2014-10-30 DIAGNOSIS — Z5112 Encounter for antineoplastic immunotherapy: Secondary | ICD-10-CM

## 2014-10-30 DIAGNOSIS — C9000 Multiple myeloma not having achieved remission: Secondary | ICD-10-CM

## 2014-10-30 LAB — CBC WITH DIFFERENTIAL (CANCER CENTER ONLY)
BASO#: 0 10*3/uL (ref 0.0–0.2)
BASO%: 0.2 % (ref 0.0–2.0)
EOS%: 1.2 % (ref 0.0–7.0)
Eosinophils Absolute: 0.2 10*3/uL (ref 0.0–0.5)
HCT: 37.8 % (ref 34.8–46.6)
HEMOGLOBIN: 12.6 g/dL (ref 11.6–15.9)
LYMPH#: 14.8 10*3/uL — ABNORMAL HIGH (ref 0.9–3.3)
LYMPH%: 78.8 % — ABNORMAL HIGH (ref 14.0–48.0)
MCH: 27.7 pg (ref 26.0–34.0)
MCHC: 33.3 g/dL (ref 32.0–36.0)
MCV: 83 fL (ref 81–101)
MONO#: 0.5 10*3/uL (ref 0.1–0.9)
MONO%: 2.5 % (ref 0.0–13.0)
NEUT%: 17.3 % — AB (ref 39.6–80.0)
NEUTROS ABS: 3.3 10*3/uL (ref 1.5–6.5)
PLATELETS: 111 10*3/uL — AB (ref 145–400)
RBC: 4.55 10*6/uL (ref 3.70–5.32)
RDW: 14.1 % (ref 11.1–15.7)
WBC: 18.8 10*3/uL — ABNORMAL HIGH (ref 3.9–10.0)

## 2014-10-30 LAB — CMP (CANCER CENTER ONLY)
ALK PHOS: 64 U/L (ref 26–84)
ALT(SGPT): 17 U/L (ref 10–47)
AST: 22 U/L (ref 11–38)
Albumin: 3.5 g/dL (ref 3.3–5.5)
BILIRUBIN TOTAL: 0.8 mg/dL (ref 0.20–1.60)
BUN: 12 mg/dL (ref 7–22)
CO2: 28 mEq/L (ref 18–33)
Calcium: 8.9 mg/dL (ref 8.0–10.3)
Chloride: 95 mEq/L — ABNORMAL LOW (ref 98–108)
Creat: 0.4 mg/dl — ABNORMAL LOW (ref 0.6–1.2)
Glucose, Bld: 88 mg/dL (ref 73–118)
POTASSIUM: 4.1 meq/L (ref 3.3–4.7)
SODIUM: 134 meq/L (ref 128–145)
TOTAL PROTEIN: 6.7 g/dL (ref 6.4–8.1)

## 2014-10-30 MED ORDER — ONDANSETRON HCL 8 MG PO TABS
ORAL_TABLET | ORAL | Status: AC
  Filled 2014-10-30: qty 1

## 2014-10-30 MED ORDER — BORTEZOMIB CHEMO SQ INJECTION 3.5 MG (2.5MG/ML)
1.3000 mg/m2 | Freq: Once | INTRAMUSCULAR | Status: AC
  Administered 2014-10-30: 2 mg via SUBCUTANEOUS
  Filled 2014-10-30: qty 2

## 2014-10-30 MED ORDER — ONDANSETRON HCL 8 MG PO TABS
8.0000 mg | ORAL_TABLET | Freq: Once | ORAL | Status: AC
  Administered 2014-10-30: 8 mg via ORAL

## 2014-10-30 NOTE — Patient Instructions (Signed)
Bortezomib injection What is this medicine? BORTEZOMIB (bor TEZ oh mib) is a chemotherapy drug. It slows the growth of cancer cells. This medicine is used to treat multiple myeloma, and certain lymphomas, such as mantle-cell lymphoma. This medicine may be used for other purposes; ask your health care provider or pharmacist if you have questions. COMMON BRAND NAME(S): Velcade What should I tell my health care provider before I take this medicine? They need to know if you have any of these conditions: -diabetes -heart disease -irregular heartbeat -liver disease -on hemodialysis -low blood counts, like low white blood cells, platelets, or hemoglobin -peripheral neuropathy -taking medicine for blood pressure -an unusual or allergic reaction to bortezomib, mannitol, boron, other medicines, foods, dyes, or preservatives -pregnant or trying to get pregnant -breast-feeding How should I use this medicine? This medicine is for injection into a vein or for injection under the skin. It is given by a health care professional in a hospital or clinic setting. Talk to your pediatrician regarding the use of this medicine in children. Special care may be needed. Overdosage: If you think you have taken too much of this medicine contact a poison control center or emergency room at once. NOTE: This medicine is only for you. Do not share this medicine with others. What if I miss a dose? It is important not to miss your dose. Call your doctor or health care professional if you are unable to keep an appointment. What may interact with this medicine? This medicine may interact with the following medications: -ketoconazole -rifampin -ritonavir -St. John's Wort This list may not describe all possible interactions. Give your health care provider a list of all the medicines, herbs, non-prescription drugs, or dietary supplements you use. Also tell them if you smoke, drink alcohol, or use illegal drugs. Some items  may interact with your medicine. What should I watch for while using this medicine? Visit your doctor for checks on your progress. This drug may make you feel generally unwell. This is not uncommon, as chemotherapy can affect healthy cells as well as cancer cells. Report any side effects. Continue your course of treatment even though you feel ill unless your doctor tells you to stop. You may get drowsy or dizzy. Do not drive, use machinery, or do anything that needs mental alertness until you know how this medicine affects you. Do not stand or sit up quickly, especially if you are an older patient. This reduces the risk of dizzy or fainting spells. In some cases, you may be given additional medicines to help with side effects. Follow all directions for their use. Call your doctor or health care professional for advice if you get a fever, chills or sore throat, or other symptoms of a cold or flu. Do not treat yourself. This drug decreases your body's ability to fight infections. Try to avoid being around people who are sick. This medicine may increase your risk to bruise or bleed. Call your doctor or health care professional if you notice any unusual bleeding. You may need blood work done while you are taking this medicine. In some patients, this medicine may cause a serious brain infection that may cause death. If you have any problems seeing, thinking, speaking, walking, or standing, tell your doctor right away. If you cannot reach your doctor, urgently seek other source of medical care. Do not become pregnant while taking this medicine. Women should inform their doctor if they wish to become pregnant or think they might be pregnant. There is   a potential for serious side effects to an unborn child. Talk to your health care professional or pharmacist for more information. Do not breast-feed an infant while taking this medicine. Check with your doctor or health care professional if you get an attack of  severe diarrhea, nausea and vomiting, or if you sweat a lot. The loss of too much body fluid can make it dangerous for you to take this medicine. What side effects may I notice from receiving this medicine? Side effects that you should report to your doctor or health care professional as soon as possible: -allergic reactions like skin rash, itching or hives, swelling of the face, lips, or tongue -breathing problems -changes in hearing -changes in vision -fast, irregular heartbeat -feeling faint or lightheaded, falls -pain, tingling, numbness in the hands or feet -right upper belly pain -seizures -swelling of the ankles, feet, hands -unusual bleeding or bruising -unusually weak or tired -vomiting -yellowing of the eyes or skin Side effects that usually do not require medical attention (report to your doctor or health care professional if they continue or are bothersome): -changes in emotions or moods -constipation -diarrhea -loss of appetite -headache -irritation at site where injected -nausea This list may not describe all possible side effects. Call your doctor for medical advice about side effects. You may report side effects to FDA at 1-800-FDA-1088. Where should I keep my medicine? This drug is given in a hospital or clinic and will not be stored at home. NOTE: This sheet is a summary. It may not cover all possible information. If you have questions about this medicine, talk to your doctor, pharmacist, or health care provider.  2015, Elsevier/Gold Standard. (2013-06-25 12:46:32)  

## 2014-10-31 ENCOUNTER — Telehealth: Payer: Self-pay | Admitting: Physician Assistant

## 2014-10-31 ENCOUNTER — Telehealth: Payer: Self-pay | Admitting: *Deleted

## 2014-10-31 ENCOUNTER — Other Ambulatory Visit: Payer: Self-pay | Admitting: Physician Assistant

## 2014-10-31 NOTE — Telephone Encounter (Signed)
74 y.o. female with recent DX of Mantle cell lymphoma on chemotherapy have right sided chest pain some radiation to right flank since Monday, worse today. Has not had BM since Tuesday, is passing gas, no N/V, epigastric pain, Nothing worse, walking is better. Has take ducolax and feels a little better but no BM yet. Instructed to take miralax until BM, if any worsening pain, SOB go to ER. She states she understands.

## 2014-10-31 NOTE — Telephone Encounter (Signed)
Patient c/o pain under her right breast. Wanted to know if her treatment yesterday could be the cause. No obvious connection between her pain and her cancer/treatment. Advised the patient to go to the ED for workup if she felt like this could be something that needed to be worked up urgently, or to call her primary care physician for workup in the next day or two. She was agreeable to plan.

## 2014-11-05 ENCOUNTER — Other Ambulatory Visit: Payer: Self-pay | Admitting: *Deleted

## 2014-11-05 DIAGNOSIS — C8310 Mantle cell lymphoma, unspecified site: Secondary | ICD-10-CM

## 2014-11-06 ENCOUNTER — Ambulatory Visit (HOSPITAL_BASED_OUTPATIENT_CLINIC_OR_DEPARTMENT_OTHER): Payer: Medicare Other | Admitting: Lab

## 2014-11-06 ENCOUNTER — Ambulatory Visit (HOSPITAL_BASED_OUTPATIENT_CLINIC_OR_DEPARTMENT_OTHER): Payer: Medicare Other

## 2014-11-06 DIAGNOSIS — Z5112 Encounter for antineoplastic immunotherapy: Secondary | ICD-10-CM

## 2014-11-06 DIAGNOSIS — C8310 Mantle cell lymphoma, unspecified site: Secondary | ICD-10-CM

## 2014-11-06 LAB — CBC WITH DIFFERENTIAL (CANCER CENTER ONLY)
BASO#: 0.1 10*3/uL (ref 0.0–0.2)
BASO%: 2 % (ref 0.0–2.0)
EOS%: 1 % (ref 0.0–7.0)
Eosinophils Absolute: 0 10*3/uL (ref 0.0–0.5)
HEMATOCRIT: 36.6 % (ref 34.8–46.6)
HGB: 12.1 g/dL (ref 11.6–15.9)
LYMPH#: 3.1 10*3/uL (ref 0.9–3.3)
LYMPH%: 79.6 % — ABNORMAL HIGH (ref 14.0–48.0)
MCH: 27.5 pg (ref 26.0–34.0)
MCHC: 33.1 g/dL (ref 32.0–36.0)
MCV: 83 fL (ref 81–101)
MONO#: 0.7 10*3/uL (ref 0.1–0.9)
MONO%: 17.1 % — ABNORMAL HIGH (ref 0.0–13.0)
NEUT#: 0 10*3/uL — CL (ref 1.5–6.5)
NEUT%: 0.3 % — ABNORMAL LOW (ref 39.6–80.0)
PLATELETS: 190 10*3/uL (ref 145–400)
RBC: 4.4 10*6/uL (ref 3.70–5.32)
RDW: 14.1 % (ref 11.1–15.7)
WBC: 3.9 10*3/uL (ref 3.9–10.0)

## 2014-11-06 LAB — CMP (CANCER CENTER ONLY)
ALT: 16 U/L (ref 10–47)
AST: 26 U/L (ref 11–38)
Albumin: 3.4 g/dL (ref 3.3–5.5)
Alkaline Phosphatase: 69 U/L (ref 26–84)
BILIRUBIN TOTAL: 0.6 mg/dL (ref 0.20–1.60)
BUN, Bld: 8 mg/dL (ref 7–22)
CO2: 28 mEq/L (ref 18–33)
Calcium: 9.1 mg/dL (ref 8.0–10.3)
Chloride: 92 mEq/L — ABNORMAL LOW (ref 98–108)
Creat: 1 mg/dl (ref 0.6–1.2)
GLUCOSE: 84 mg/dL (ref 73–118)
Potassium: 3.8 mEq/L (ref 3.3–4.7)
SODIUM: 136 meq/L (ref 128–145)
Total Protein: 6.7 g/dL (ref 6.4–8.1)

## 2014-11-06 MED ORDER — LEVOFLOXACIN 500 MG PO TABS: 500.0000 mg | ORAL_TABLET | Freq: Every day | ORAL | Status: DC

## 2014-11-06 MED ORDER — ONDANSETRON HCL 8 MG PO TABS
ORAL_TABLET | ORAL | Status: AC
  Filled 2014-11-06: qty 1

## 2014-11-06 MED ORDER — BORTEZOMIB CHEMO SQ INJECTION 3.5 MG (2.5MG/ML)
1.3000 mg/m2 | Freq: Once | INTRAMUSCULAR | Status: AC
  Administered 2014-11-06: 2 mg via SUBCUTANEOUS
  Filled 2014-11-06: qty 2

## 2014-11-06 MED ORDER — FLUCONAZOLE 100 MG PO TABS: 100.0000 mg | ORAL_TABLET | Freq: Every day | ORAL | Status: DC

## 2014-11-06 MED ORDER — ONDANSETRON HCL 8 MG PO TABS
8.0000 mg | ORAL_TABLET | Freq: Once | ORAL | Status: AC
  Administered 2014-11-06: 8 mg via ORAL

## 2014-11-06 NOTE — Progress Notes (Signed)
11:45 AM Dr. Marin Olp notified/shown lab work, OK to treat.

## 2014-11-06 NOTE — Patient Instructions (Signed)
Bortezomib injection What is this medicine? BORTEZOMIB (bor TEZ oh mib) is a chemotherapy drug. It slows the growth of cancer cells. This medicine is used to treat multiple myeloma, and certain lymphomas, such as mantle-cell lymphoma. This medicine may be used for other purposes; ask your health care provider or pharmacist if you have questions. COMMON BRAND NAME(S): Velcade What should I tell my health care provider before I take this medicine? They need to know if you have any of these conditions: -diabetes -heart disease -irregular heartbeat -liver disease -on hemodialysis -low blood counts, like low white blood cells, platelets, or hemoglobin -peripheral neuropathy -taking medicine for blood pressure -an unusual or allergic reaction to bortezomib, mannitol, boron, other medicines, foods, dyes, or preservatives -pregnant or trying to get pregnant -breast-feeding How should I use this medicine? This medicine is for injection into a vein or for injection under the skin. It is given by a health care professional in a hospital or clinic setting. Talk to your pediatrician regarding the use of this medicine in children. Special care may be needed. Overdosage: If you think you have taken too much of this medicine contact a poison control center or emergency room at once. NOTE: This medicine is only for you. Do not share this medicine with others. What if I miss a dose? It is important not to miss your dose. Call your doctor or health care professional if you are unable to keep an appointment. What may interact with this medicine? This medicine may interact with the following medications: -ketoconazole -rifampin -ritonavir -St. John's Wort This list may not describe all possible interactions. Give your health care provider a list of all the medicines, herbs, non-prescription drugs, or dietary supplements you use. Also tell them if you smoke, drink alcohol, or use illegal drugs. Some items  may interact with your medicine. What should I watch for while using this medicine? Visit your doctor for checks on your progress. This drug may make you feel generally unwell. This is not uncommon, as chemotherapy can affect healthy cells as well as cancer cells. Report any side effects. Continue your course of treatment even though you feel ill unless your doctor tells you to stop. You may get drowsy or dizzy. Do not drive, use machinery, or do anything that needs mental alertness until you know how this medicine affects you. Do not stand or sit up quickly, especially if you are an older patient. This reduces the risk of dizzy or fainting spells. In some cases, you may be given additional medicines to help with side effects. Follow all directions for their use. Call your doctor or health care professional for advice if you get a fever, chills or sore throat, or other symptoms of a cold or flu. Do not treat yourself. This drug decreases your body's ability to fight infections. Try to avoid being around people who are sick. This medicine may increase your risk to bruise or bleed. Call your doctor or health care professional if you notice any unusual bleeding. You may need blood work done while you are taking this medicine. In some patients, this medicine may cause a serious brain infection that may cause death. If you have any problems seeing, thinking, speaking, walking, or standing, tell your doctor right away. If you cannot reach your doctor, urgently seek other source of medical care. Do not become pregnant while taking this medicine. Women should inform their doctor if they wish to become pregnant or think they might be pregnant. There is   a potential for serious side effects to an unborn child. Talk to your health care professional or pharmacist for more information. Do not breast-feed an infant while taking this medicine. Check with your doctor or health care professional if you get an attack of  severe diarrhea, nausea and vomiting, or if you sweat a lot. The loss of too much body fluid can make it dangerous for you to take this medicine. What side effects may I notice from receiving this medicine? Side effects that you should report to your doctor or health care professional as soon as possible: -allergic reactions like skin rash, itching or hives, swelling of the face, lips, or tongue -breathing problems -changes in hearing -changes in vision -fast, irregular heartbeat -feeling faint or lightheaded, falls -pain, tingling, numbness in the hands or feet -right upper belly pain -seizures -swelling of the ankles, feet, hands -unusual bleeding or bruising -unusually weak or tired -vomiting -yellowing of the eyes or skin Side effects that usually do not require medical attention (report to your doctor or health care professional if they continue or are bothersome): -changes in emotions or moods -constipation -diarrhea -loss of appetite -headache -irritation at site where injected -nausea This list may not describe all possible side effects. Call your doctor for medical advice about side effects. You may report side effects to FDA at 1-800-FDA-1088. Where should I keep my medicine? This drug is given in a hospital or clinic and will not be stored at home. NOTE: This sheet is a summary. It may not cover all possible information. If you have questions about this medicine, talk to your doctor, pharmacist, or health care provider.  2015, Elsevier/Gold Standard. (2013-06-25 12:46:32)  

## 2014-11-07 NOTE — Progress Notes (Signed)
Hematology and Oncology Follow Up Visit  Shannon Parsons [id number] 05-26-1941 74 y.o. 11/07/2014   Principle Diagnosis:   Mantle cell lymphoma  Current Therapy:    Status post cycle 1 of RV-CAP     Interim History:  [name] is in for a treatment with Velcade today. I want to stop I talked her about lows going on.  She has mantle cell lymphoma. We did do a bone marrow biopsy on her. This was done on February 8. The bone marrow report ([id number]) showed a hypercellular marrow consistent with the Mantle cell lymphoma. This is no surprise. I told that I expected the bone marrow to be positive.  She is already responding. She had her first cycle of chemotherapy back on the February 10th. She is getting weekly Velcade.  She feels well. She's had no fever.  She is on Famvir.  I want her on Levaquin. Her white cell count is quite low. Her ANC is 0. Her monocytes are up so I would leave that her white cell count is trending up.  Unfortunately, we have not billed to give her Neulasta. This is because of insurance rules. I also have not been able to give her Neupogen.  Again, her lymphadenopathy is improving.  She's had no mouth sores.  No nausea or vomiting..  Medications:  Current outpatient prescriptions:  .  ranitidine (ZANTAC) 300 MG tablet, Take 300 mg by mouth at bedtime., Disp: , Rfl:  .  acetaminophen (TYLENOL) 325 MG tablet, Take 650 mg by mouth every 6 (six) hours as needed (Pain)., Disp: , Rfl:  .  allopurinol (ZYLOPRIM) 100 MG tablet, Take 1 tablet (100 mg total) by mouth daily., Disp: 28 tablet, Rfl: 0 .  ALPRAZolam (XANAX) 0.5 MG tablet, Take 1/2 to 1 tablet 3 x day  as needed for anxiety or sleep (Patient taking differently: Take 0.5 mg by mouth 3 (three) times daily as needed for anxiety or sleep. ), Disp: 90 tablet, Rfl: 3 .  Cholecalciferol (VITAMIN D) 2000 UNITS CAPS, Take 2,000-4,000 capsules by mouth every other day. Patient alternates 1 cap with 2 caps  every other day., Disp: , Rfl:  .  famciclovir (FAMVIR) 500 MG tablet, Take 1 tablet (500 mg total) by mouth daily., Disp: 30 tablet, Rfl: 12 .  fluconazole (DIFLUCAN) 100 MG tablet, Take 1 tablet (100 mg total) by mouth daily., Disp: 28 tablet, Rfl: 4 .  hyoscyamine (LEVSIN, ANASPAZ) 0.125 MG tablet, Take 1 to 2 tablets every 4 hours as needed for nausea, cramping, bloating or diarrhea, Disp: 90 tablet, Rfl: 11 .  levofloxacin (LEVAQUIN) 500 MG tablet, Take 1 tablet (500 mg total) by mouth daily., Disp: 14 tablet, Rfl: 4 .  levothyroxine (SYNTHROID, LEVOTHROID) 50 MCG tablet, Take 50 mcg by mouth daily before breakfast., Disp: , Rfl:  .  levothyroxine (SYNTHROID, LEVOTHROID) 50 MCG tablet, TAKE 1 TABLET BY MOUTH ONCE DAILY., Disp: 90 tablet, Rfl: 0 .  lidocaine-prilocaine (EMLA) cream, Apply 1 application topically as needed. Apply to [facility] one hour before access., Disp: 30 g, Rfl: 3 .  LORazepam (ATIVAN) 0.5 MG tablet, Take 1 tablet (0.5 mg total) by mouth every 6 (six) hours as needed (Nausea or vomiting)., Disp: 30 tablet, Rfl: 0 .  Magnesium 200 MG TABS, Take 1 tablet by mouth 4 (four) times a week. , Disp: , Rfl:  .  ondansetron (ZOFRAN) 8 MG tablet, Take 1 pill twice a day for 4 days. Start the day after each chemotherapy  treatment., Disp: 30 tablet, Rfl: 3 .  predniSONE (DELTASONE) 20 MG tablet, Take 3 tablets (60 mg total) by mouth daily. Take on days 1-5 of chemotherapy., Disp: 120 tablet, Rfl: 2 .  Probiotic Product (PROBIOTIC DAILY PO), Take 1 capsule by mouth daily., Disp: , Rfl:  .  prochlorperazine (COMPAZINE) 10 MG tablet, Take 1 tablet (10 mg total) by mouth every 6 (six) hours as needed (Nausea or vomiting)., Disp: 30 tablet, Rfl: 6 .  QVAR 80 MCG/ACT inhaler, INHALE 2 PUFFS INTO THE LUNGS 2 TIMES DAILY. (Patient taking differently: INHALE 1 PUFFS INTO THE LUNGS 2 TIMES DAILY.), Disp: 8.7 g, Rfl: 6 .  Zinc 50 MG TABS, Take 25 mg by mouth 4 (four) times a week. , Disp: , Rfl:    Allergies:  Allergies  Allergen Reactions  . Actonel [Risedronate Sodium]     "made me choke"   . Fosamax [Alendronate]     "made me choke"  . Sulfa Antibiotics     Doesn't remember   . Tetracyclines & Related Rash    Past Medical History, Surgical history, Social history, and Family History were reviewed and updated.  Review of Systems: As above  Physical Exam:  oral temperature is 98.3 F (36.8 C). Her blood pressure is 118/77 and her pulse is 76. Her respiration is 20.   Oral exam shows no mucositis. She has no obvious adenopathy on her neck. Her lungs are clear. Cardiac exam regular rate and rhythm. I asked her exam shows decrease in bilateral lymphadenopathy. Abdomen is soft. There is no fluid wave. Extremities shows no clubbing, cyanosis or edema. Skin exam no rashes. Neurological exam is nonfocal.  Lab Results  Component Value Date   WBC 3.9 11/06/2014   HGB 12.1 11/06/2014   HCT 36.6 11/06/2014   MCV 83 11/06/2014   PLT 190 11/06/2014     Chemistry      Component Value Date/Time   NA 136 11/06/2014 1107   NA 138 09/10/2014 1459   K 3.8 11/06/2014 1107   K 4.3 09/10/2014 1459   CL 92* 11/06/2014 1107   CL 102 09/10/2014 1459   CO2 28 11/06/2014 1107   CO2 27 09/10/2014 1459   BUN 8 11/06/2014 1107   BUN 13 09/10/2014 1459   CREATININE 1.0 11/06/2014 1107   CREATININE 0.90 02/27/2014 1845      Component Value Date/Time   CALCIUM 9.1 11/06/2014 1107   CALCIUM 9.4 09/10/2014 1459   ALKPHOS 69 11/06/2014 1107   ALKPHOS 83 09/10/2014 1459   AST 26 11/06/2014 1107   AST 39* 09/10/2014 1459   ALT 16 11/06/2014 1107   ALT 23 09/10/2014 1459   BILITOT 0.60 11/06/2014 1107   BILITOT 0.5 09/10/2014 1459         Impression and Plan: Shannon Parsons is a 73 year old white female with mantle cell lymphoma.  She is responding to treatment.   I think with the monocytes being up, we can go ahead with the Velcade today.  I think that with the bone marrow  being involved, we may have to consider CNS therapy at some point.  She'll be back next week area in her told her to call us with any temperature over 100.5.   Volanda Napoleon, MD 2/25/201610:43 AM

## 2014-11-07 NOTE — Addendum Note (Signed)
Addended by: Burney Gauze R on: 11/07/2014 10:56 AM   Modules accepted: Level of Service

## 2014-11-12 ENCOUNTER — Other Ambulatory Visit: Payer: Self-pay | Admitting: *Deleted

## 2014-11-12 DIAGNOSIS — C8310 Mantle cell lymphoma, unspecified site: Secondary | ICD-10-CM

## 2014-11-13 ENCOUNTER — Ambulatory Visit (HOSPITAL_BASED_OUTPATIENT_CLINIC_OR_DEPARTMENT_OTHER): Payer: Medicare Other | Admitting: Hematology & Oncology

## 2014-11-13 ENCOUNTER — Other Ambulatory Visit: Payer: Medicare Other | Admitting: Lab

## 2014-11-13 ENCOUNTER — Encounter: Payer: Self-pay | Admitting: Hematology & Oncology

## 2014-11-13 ENCOUNTER — Ambulatory Visit (HOSPITAL_BASED_OUTPATIENT_CLINIC_OR_DEPARTMENT_OTHER): Payer: Medicare Other

## 2014-11-13 VITALS — BP 135/66 | HR 77 | Temp 98.0°F | Resp 14 | Ht 62.0 in | Wt 127.0 lb

## 2014-11-13 DIAGNOSIS — Z5111 Encounter for antineoplastic chemotherapy: Secondary | ICD-10-CM

## 2014-11-13 DIAGNOSIS — Z5112 Encounter for antineoplastic immunotherapy: Secondary | ICD-10-CM

## 2014-11-13 DIAGNOSIS — C8310 Mantle cell lymphoma, unspecified site: Secondary | ICD-10-CM

## 2014-11-13 LAB — CBC WITH DIFFERENTIAL (CANCER CENTER ONLY)
BASO#: 0.1 10*3/uL (ref 0.0–0.2)
BASO%: 1 % (ref 0.0–2.0)
EOS ABS: 0 10*3/uL (ref 0.0–0.5)
EOS%: 0.4 % (ref 0.0–7.0)
HCT: 42.1 % (ref 34.8–46.6)
HGB: 13.8 g/dL (ref 11.6–15.9)
LYMPH#: 3.6 10*3/uL — ABNORMAL HIGH (ref 0.9–3.3)
LYMPH%: 52.9 % — ABNORMAL HIGH (ref 14.0–48.0)
MCH: 27.5 pg (ref 26.0–34.0)
MCHC: 32.8 g/dL (ref 32.0–36.0)
MCV: 84 fL (ref 81–101)
MONO#: 1.2 10*3/uL — AB (ref 0.1–0.9)
MONO%: 17.6 % — AB (ref 0.0–13.0)
NEUT#: 1.9 10*3/uL (ref 1.5–6.5)
NEUT%: 28.1 % — ABNORMAL LOW (ref 39.6–80.0)
PLATELETS: 230 10*3/uL (ref 145–400)
RBC: 5.02 10*6/uL (ref 3.70–5.32)
RDW: 15 % (ref 11.1–15.7)
WBC: 6.9 10*3/uL (ref 3.9–10.0)

## 2014-11-13 LAB — CMP (CANCER CENTER ONLY)
ALBUMIN: 3.4 g/dL (ref 3.3–5.5)
ALT(SGPT): 16 U/L (ref 10–47)
AST: 31 U/L (ref 11–38)
Alkaline Phosphatase: 67 U/L (ref 26–84)
BILIRUBIN TOTAL: 0.5 mg/dL (ref 0.20–1.60)
BUN: 7 mg/dL (ref 7–22)
CO2: 27 meq/L (ref 18–33)
Calcium: 9.2 mg/dL (ref 8.0–10.3)
Chloride: 96 mEq/L — ABNORMAL LOW (ref 98–108)
Creat: 1 mg/dl (ref 0.6–1.2)
GLUCOSE: 62 mg/dL — AB (ref 73–118)
Potassium: 3.6 mEq/L (ref 3.3–4.7)
SODIUM: 141 meq/L (ref 128–145)
Total Protein: 7.2 g/dL (ref 6.4–8.1)

## 2014-11-13 MED ORDER — BORTEZOMIB CHEMO SQ INJECTION 3.5 MG (2.5MG/ML)
1.3000 mg/m2 | Freq: Once | INTRAMUSCULAR | Status: AC
  Administered 2014-11-13: 2 mg via SUBCUTANEOUS
  Filled 2014-11-13: qty 2

## 2014-11-13 MED ORDER — ACETAMINOPHEN 325 MG PO TABS
650.0000 mg | ORAL_TABLET | Freq: Once | ORAL | Status: AC
  Administered 2014-11-13: 650 mg via ORAL

## 2014-11-13 MED ORDER — DIPHENHYDRAMINE HCL 25 MG PO CAPS
ORAL_CAPSULE | ORAL | Status: AC
  Filled 2014-11-13: qty 1

## 2014-11-13 MED ORDER — DEXAMETHASONE SODIUM PHOSPHATE 20 MG/5ML IJ SOLN
INTRAMUSCULAR | Status: AC
  Filled 2014-11-13: qty 5

## 2014-11-13 MED ORDER — MEGESTROL ACETATE 400 MG/10ML PO SUSP: 400.0000 mg | Freq: Two times a day (BID) | ORAL | Status: DC

## 2014-11-13 MED ORDER — SODIUM CHLORIDE 0.9 % IV SOLN
Freq: Once | INTRAVENOUS | Status: AC
  Administered 2014-11-13: 09:00:00 via INTRAVENOUS

## 2014-11-13 MED ORDER — SODIUM CHLORIDE 0.9 % IV SOLN
375.0000 mg/m2 | Freq: Once | INTRAVENOUS | Status: AC
  Administered 2014-11-13: 600 mg via INTRAVENOUS
  Filled 2014-11-13: qty 50

## 2014-11-13 MED ORDER — DEXAMETHASONE SODIUM PHOSPHATE 20 MG/5ML IJ SOLN
20.0000 mg | Freq: Once | INTRAMUSCULAR | Status: AC
  Administered 2014-11-13: 20 mg via INTRAVENOUS

## 2014-11-13 MED ORDER — DIPHENHYDRAMINE HCL 25 MG PO CAPS
50.0000 mg | ORAL_CAPSULE | Freq: Once | ORAL | Status: AC
  Administered 2014-11-13: 50 mg via ORAL

## 2014-11-13 MED ORDER — CYCLOPHOSPHAMIDE CHEMO INJECTION 1 GM
750.0000 mg/m2 | Freq: Once | INTRAMUSCULAR | Status: AC
  Administered 2014-11-13: 1200 mg via INTRAVENOUS
  Filled 2014-11-13: qty 60

## 2014-11-13 MED ORDER — ONDANSETRON 16 MG/50ML IVPB (CHCC)
16.0000 mg | Freq: Once | INTRAVENOUS | Status: AC
  Administered 2014-11-13: 16 mg via INTRAVENOUS

## 2014-11-13 MED ORDER — DOXORUBICIN HCL CHEMO IV INJECTION 2 MG/ML
50.0000 mg/m2 | Freq: Once | INTRAVENOUS | Status: AC
  Administered 2014-11-13: 80 mg via INTRAVENOUS
  Filled 2014-11-13: qty 40

## 2014-11-13 MED ORDER — ACETAMINOPHEN 325 MG PO TABS
ORAL_TABLET | ORAL | Status: AC
  Filled 2014-11-13: qty 2

## 2014-11-13 MED ORDER — HEPARIN SOD (PORK) LOCK FLUSH 100 UNIT/ML IV SOLN
500.0000 [IU] | Freq: Once | INTRAVENOUS | Status: AC | PRN
  Administered 2014-11-13: 500 [IU]
  Filled 2014-11-13: qty 5

## 2014-11-13 MED ORDER — SODIUM CHLORIDE 0.9 % IJ SOLN
10.0000 mL | INTRAMUSCULAR | Status: DC | PRN
  Administered 2014-11-13: 10 mL
  Filled 2014-11-13: qty 10

## 2014-11-13 NOTE — Progress Notes (Signed)
Hematology and Oncology Follow Up Visit  Shannon Parsons [id number] September 11, 1941 74 y.o. 11/13/2014   Principle Diagnosis:   Mantle cell lymphoma (MCIPI = 5)  Current Therapy:    Patient s/p cycle #1 of RV-CAP     Interim History:  Ms.  Shannon Parsons is back for follow-up. She got through her first cycle chemotherapy fairly well. She's this is some weight. This really troubles me. I will give her some Megace elixir to try to help with her appetite.  She did have a bone marrow biopsy done. This did show bone marrow involvement. This is no surprise.  Her lymphadenopathy in her neck has resolved. She has not complained of any pain.  She does have some asthma issues. She's had no exacerbations.  I do have her on Famvir, Levaquin, and Diflucan.  Because of insurance issues, we have not be oh to give her Neupogen or Neulasta. Thankfully, she has not had any problems with infections.  She's had no rashes. She's had no leg swelling. She's had no fever.   She's had no headache. There is no neurological findings.  Medications:  Current outpatient prescriptions:  .  acetaminophen (TYLENOL) 325 MG tablet, Take 650 mg by mouth every 6 (six) hours as needed (Pain)., Disp: , Rfl:  .  ALPRAZolam (XANAX) 0.5 MG tablet, Take 1/2 to 1 tablet 3 x day  as needed for anxiety or sleep (Patient taking differently: Take 0.5 mg by mouth 3 (three) times daily as needed for anxiety or sleep. ), Disp: 90 tablet, Rfl: 3 .  Cholecalciferol (VITAMIN D) 2000 UNITS CAPS, Take 2,000-4,000 capsules by mouth every other day. Patient alternates 1 cap with 2 caps every other day., Disp: , Rfl:  .  famciclovir (FAMVIR) 500 MG tablet, Take 1 tablet (500 mg total) by mouth daily., Disp: 30 tablet, Rfl: 12 .  fluconazole (DIFLUCAN) 100 MG tablet, Take 1 tablet (100 mg total) by mouth daily., Disp: 28 tablet, Rfl: 4 .  levofloxacin (LEVAQUIN) 500 MG tablet, Take 1 tablet (500 mg total) by mouth daily., Disp: 14 tablet, Rfl:  4 .  levothyroxine (SYNTHROID, LEVOTHROID) 50 MCG tablet, TAKE 1 TABLET BY MOUTH ONCE DAILY., Disp: 90 tablet, Rfl: 0 .  lidocaine-prilocaine (EMLA) cream, Apply 1 application topically as needed. Apply to [facility] one hour before access., Disp: 30 g, Rfl: 3 .  Magnesium 200 MG TABS, Take 1 tablet by mouth 4 (four) times a week. , Disp: , Rfl:  .  ondansetron (ZOFRAN) 8 MG tablet, Take 1 pill twice a day for 4 days. Start the day after each chemotherapy treatment., Disp: 30 tablet, Rfl: 3 .  predniSONE (DELTASONE) 20 MG tablet, Take 3 tablets (60 mg total) by mouth daily. Take on days 1-5 of chemotherapy., Disp: 120 tablet, Rfl: 2 .  Probiotic Product (PROBIOTIC DAILY PO), Take 1 capsule by mouth daily., Disp: , Rfl:  .  QVAR 80 MCG/ACT inhaler, INHALE 2 PUFFS INTO THE LUNGS 2 TIMES DAILY. (Patient taking differently: INHALE 1 PUFFS INTO THE LUNGS 2 TIMES DAILY.), Disp: 8.7 g, Rfl: 6 .  ranitidine (ZANTAC) 300 MG tablet, Take 300 mg by mouth at bedtime., Disp: , Rfl:  .  Zinc 50 MG TABS, Take 25 mg by mouth 4 (four) times a week. , Disp: , Rfl:  .  hyoscyamine (LEVSIN, ANASPAZ) 0.125 MG tablet, Take 1 to 2 tablets every 4 hours as needed for nausea, cramping, bloating or diarrhea (Patient not taking: Reported on 11/13/2014), Disp: 90 tablet,  Rfl: 11 .  LORazepam (ATIVAN) 0.5 MG tablet, Take 1 tablet (0.5 mg total) by mouth every 6 (six) hours as needed (Nausea or vomiting). (Patient not taking: Reported on 11/13/2014), Disp: 30 tablet, Rfl: 0 .  megestrol (MEGACE) 400 MG/10ML suspension, Take 10 mLs (400 mg total) by mouth 2 (two) times daily., Disp: 480 mL, Rfl: 0 .  prochlorperazine (COMPAZINE) 10 MG tablet, Take 1 tablet (10 mg total) by mouth every 6 (six) hours as needed (Nausea or vomiting). (Patient not taking: Reported on 11/13/2014), Disp: 30 tablet, Rfl: 6 No current facility-administered medications for this visit.  Facility-Administered Medications Ordered in Other Visits:  .  bortezomib SQ  (VELCADE) chemo injection 2 mg, 1.3 mg/m2 (Treatment Plan Actual), Subcutaneous, Once, Volanda Napoleon, MD .  cyclophosphamide (CYTOXAN) 1,200 mg in sodium chloride 0.9 % 250 mL chemo infusion, 750 mg/m2 (Treatment Plan Actual), Intravenous, Once, Volanda Napoleon, MD .  DOXOrubicin (ADRIAMYCIN) chemo injection 80 mg, 50 mg/m2 (Treatment Plan Actual), Intravenous, Once, Volanda Napoleon, MD .  heparin lock flush 100 unit/mL, 500 Units, Intracatheter, Once PRN, Volanda Napoleon, MD .  riTUXimab (RITUXAN) 600 mg in sodium chloride 0.9 % 190 mL chemo infusion, 375 mg/m2 (Treatment Plan Actual), Intravenous, Once, Volanda Napoleon, MD .  sodium chloride 0.9 % injection 10 mL, 10 mL, Intracatheter, PRN, Volanda Napoleon, MD  Allergies:  Allergies  Allergen Reactions  . Actonel [Risedronate Sodium]     "made me choke"   . Fosamax [Alendronate]     "made me choke"  . Sulfa Antibiotics     Doesn't remember   . Tetracyclines & Related Rash    Past Medical History, Surgical history, Social history, and Family History were reviewed and updated.  Review of Systems: As above  Physical Exam:  height is 5' 2"  (1.575 m) and weight is 127 lb (57.607 kg). Her oral temperature is 98 F (36.7 C). Her blood pressure is 135/66 and her pulse is 77. Her respiration is 14.   Well-developed and well-nourished white female. Head and neck exam shows no ocular or oral lesions. She has no bilateral cervical or supraclavicular lymph nodes. She has no palpable thyroid. Lungs are clear. Cardiac exam regular rate and rhythm with no murmurs, rubs or bruits. Axillary exam shows bilateral axillary adenopathy. These have improved. They're mobile and not as prominent. Abdomen is soft. She has decent bowel sounds. There is no fluid wave. Her spleen tip is just below the left costal margin. There is no hepatomegaly. Back exam shows no tenderness over the spine, ribs or hips. Extremities shows no clubbing, cyanosis or edema. She has  good range of motion of her joints. She has good strength in her muscles. Neurological exam is nonfocal. Skin exam shows no rashes, ecchymoses or petechia  Lab Results  Component Value Date   WBC 6.9 11/13/2014   HGB 13.8 11/13/2014   HCT 42.1 11/13/2014   MCV 84 11/13/2014   PLT 230 11/13/2014     Chemistry      Component Value Date/Time   NA 141 11/13/2014 0813   NA 138 09/10/2014 1459   K 3.6 11/13/2014 0813   K 4.3 09/10/2014 1459   CL 96* 11/13/2014 0813   CL 102 09/10/2014 1459   CO2 27 11/13/2014 0813   CO2 27 09/10/2014 1459   BUN 7 11/13/2014 0813   BUN 13 09/10/2014 1459   CREATININE 1.0 11/13/2014 0813   CREATININE 0.90 02/27/2014 1845  Component Value Date/Time   CALCIUM 9.2 11/13/2014 0813   CALCIUM 9.4 09/10/2014 1459   ALKPHOS 67 11/13/2014 0813   ALKPHOS 83 09/10/2014 1459   AST 31 11/13/2014 0813   AST 39* 09/10/2014 1459   ALT 16 11/13/2014 0813   ALT 23 09/10/2014 1459   BILITOT 0.50 11/13/2014 0813   BILITOT 0.5 09/10/2014 1459         Impression and Plan: Ms. Standiford is 74 year old white female. She has mantle cell lymphoma. Again, by the [facility] she is an intermediate risk.  We will proceed with her second cycle of chemotherapy. Her lab work looks quite good. Her physical exam looks better. Again, cannot palpate any lymph nodes in her neck. The axillary lymph nodes are resolving.  After this cycle of treatment, we will go ahead with a PET scan and see how this looks.  I again explained to them the difficulties with mantle cell lymphoma. I really don't think that she is curable without a transplant. I just am not convinced that she is a transplant candidate area did  So far, she's done well. I think if we get her into a remission, we might want to think about a transplant.  She will come in weekly for her Velcade. We will be checking her blood counts.  We will have to be very diligent with monitoring her blood counts.  I spent about 30  minutes with she and her husband. Volanda Napoleon, MD 3/2/20169:43 AM

## 2014-11-13 NOTE — Patient Instructions (Signed)
[facility] Discharge Instructions for Patients Receiving Chemotherapy  Today you received the following chemotherapy agents Rituxan, Cytoxan, Velcade, Adriamycin  To help prevent nausea and vomiting after your treatment, we encourage you to take your nausea medication    If you develop nausea and vomiting that is not controlled by your nausea medication, call the clinic.   BELOW ARE SYMPTOMS THAT SHOULD BE REPORTED IMMEDIATELY:  *FEVER GREATER THAN 100.5 F  *CHILLS WITH OR WITHOUT FEVER  NAUSEA AND VOMITING THAT IS NOT CONTROLLED WITH YOUR NAUSEA MEDICATION  *UNUSUAL SHORTNESS OF BREATH  *UNUSUAL BRUISING OR BLEEDING  TENDERNESS IN MOUTH AND THROAT WITH OR WITHOUT PRESENCE OF ULCERS  *URINARY PROBLEMS  *BOWEL PROBLEMS  UNUSUAL RASH Items with * indicate a potential emergency and should be followed up as soon as possible.  Feel free to call the clinic you have any questions or concerns. The clinic phone number is [phone].

## 2014-11-14 ENCOUNTER — Other Ambulatory Visit: Payer: Self-pay | Admitting: *Deleted

## 2014-11-14 DIAGNOSIS — C8310 Mantle cell lymphoma, unspecified site: Secondary | ICD-10-CM

## 2014-11-14 MED ORDER — MEGESTROL ACETATE 400 MG/10ML PO SUSP: 400.0000 mg | Freq: Two times a day (BID) | ORAL | Status: DC

## 2014-11-19 ENCOUNTER — Encounter (HOSPITAL_COMMUNITY): Payer: Self-pay

## 2014-11-20 ENCOUNTER — Other Ambulatory Visit (HOSPITAL_BASED_OUTPATIENT_CLINIC_OR_DEPARTMENT_OTHER): Payer: Medicare Other | Admitting: Lab

## 2014-11-20 ENCOUNTER — Ambulatory Visit (HOSPITAL_BASED_OUTPATIENT_CLINIC_OR_DEPARTMENT_OTHER): Payer: Medicare Other

## 2014-11-20 DIAGNOSIS — Z5112 Encounter for antineoplastic immunotherapy: Secondary | ICD-10-CM

## 2014-11-20 DIAGNOSIS — C8310 Mantle cell lymphoma, unspecified site: Secondary | ICD-10-CM

## 2014-11-20 LAB — CBC WITH DIFFERENTIAL (CANCER CENTER ONLY)
BASO#: 0 10*3/uL (ref 0.0–0.2)
BASO%: 0.9 % (ref 0.0–2.0)
EOS%: 0.6 % (ref 0.0–7.0)
Eosinophils Absolute: 0 10*3/uL (ref 0.0–0.5)
HCT: 39.2 % (ref 34.8–46.6)
HGB: 13.5 g/dL (ref 11.6–15.9)
LYMPH#: 1 10*3/uL (ref 0.9–3.3)
LYMPH%: 32.1 % (ref 14.0–48.0)
MCH: 27.6 pg (ref 26.0–34.0)
MCHC: 34.4 g/dL (ref 32.0–36.0)
MCV: 80 fL — AB (ref 81–101)
MONO#: 0 10*3/uL — ABNORMAL LOW (ref 0.1–0.9)
MONO%: 0.3 % (ref 0.0–13.0)
NEUT%: 66.1 % (ref 39.6–80.0)
NEUTROS ABS: 2.1 10*3/uL (ref 1.5–6.5)
Platelets: 74 10*3/uL — ABNORMAL LOW (ref 145–400)
RBC: 4.89 10*6/uL (ref 3.70–5.32)
RDW: 14.9 % (ref 11.1–15.7)
WBC: 3.2 10*3/uL — ABNORMAL LOW (ref 3.9–10.0)

## 2014-11-20 LAB — CMP (CANCER CENTER ONLY)
ALK PHOS: 45 U/L (ref 26–84)
ALT(SGPT): 17 U/L (ref 10–47)
AST: 27 U/L (ref 11–38)
Albumin: 3.3 g/dL (ref 3.3–5.5)
BILIRUBIN TOTAL: 1.1 mg/dL (ref 0.20–1.60)
BUN, Bld: 17 mg/dL (ref 7–22)
CALCIUM: 9.3 mg/dL (ref 8.0–10.3)
CHLORIDE: 102 meq/L (ref 98–108)
CO2: 26 meq/L (ref 18–33)
Creat: 0.9 mg/dl (ref 0.6–1.2)
Glucose, Bld: 105 mg/dL (ref 73–118)
Potassium: 4.1 mEq/L (ref 3.3–4.7)
SODIUM: 132 meq/L (ref 128–145)
Total Protein: 6.5 g/dL (ref 6.4–8.1)

## 2014-11-20 LAB — LACTATE DEHYDROGENASE: LDH: 164 U/L (ref 94–250)

## 2014-11-20 MED ORDER — ONDANSETRON HCL 8 MG PO TABS
8.0000 mg | ORAL_TABLET | Freq: Once | ORAL | Status: AC
  Administered 2014-11-20: 8 mg via ORAL

## 2014-11-20 MED ORDER — ONDANSETRON HCL 8 MG PO TABS
ORAL_TABLET | ORAL | Status: AC
  Filled 2014-11-20: qty 1

## 2014-11-20 MED ORDER — BORTEZOMIB CHEMO SQ INJECTION 3.5 MG (2.5MG/ML)
1.3000 mg/m2 | Freq: Once | INTRAMUSCULAR | Status: AC
  Administered 2014-11-20: 2 mg via SUBCUTANEOUS
  Filled 2014-11-20: qty 2

## 2014-11-20 NOTE — Progress Notes (Signed)
11/20/2014 Dr. Marin Olp notified of platelet result. OK to treat.

## 2014-11-20 NOTE — Patient Instructions (Signed)
Bortezomib injection What is this medicine? BORTEZOMIB (bor TEZ oh mib) is a chemotherapy drug. It slows the growth of cancer cells. This medicine is used to treat multiple myeloma, and certain lymphomas, such as mantle-cell lymphoma. This medicine may be used for other purposes; ask your health care provider or pharmacist if you have questions. COMMON BRAND NAME(S): Velcade What should I tell my health care provider before I take this medicine? They need to know if you have any of these conditions: -diabetes -heart disease -irregular heartbeat -liver disease -on hemodialysis -low blood counts, like low white blood cells, platelets, or hemoglobin -peripheral neuropathy -taking medicine for blood pressure -an unusual or allergic reaction to bortezomib, mannitol, boron, other medicines, foods, dyes, or preservatives -pregnant or trying to get pregnant -breast-feeding How should I use this medicine? This medicine is for injection into a vein or for injection under the skin. It is given by a health care professional in a hospital or clinic setting. Talk to your pediatrician regarding the use of this medicine in children. Special care may be needed. Overdosage: If you think you have taken too much of this medicine contact a poison control center or emergency room at once. NOTE: This medicine is only for you. Do not share this medicine with others. What if I miss a dose? It is important not to miss your dose. Call your doctor or health care professional if you are unable to keep an appointment. What may interact with this medicine? This medicine may interact with the following medications: -ketoconazole -rifampin -ritonavir -St. John's Wort This list may not describe all possible interactions. Give your health care provider a list of all the medicines, herbs, non-prescription drugs, or dietary supplements you use. Also tell them if you smoke, drink alcohol, or use illegal drugs. Some items  may interact with your medicine. What should I watch for while using this medicine? Visit your doctor for checks on your progress. This drug may make you feel generally unwell. This is not uncommon, as chemotherapy can affect healthy cells as well as cancer cells. Report any side effects. Continue your course of treatment even though you feel ill unless your doctor tells you to stop. You may get drowsy or dizzy. Do not drive, use machinery, or do anything that needs mental alertness until you know how this medicine affects you. Do not stand or sit up quickly, especially if you are an older patient. This reduces the risk of dizzy or fainting spells. In some cases, you may be given additional medicines to help with side effects. Follow all directions for their use. Call your doctor or health care professional for advice if you get a fever, chills or sore throat, or other symptoms of a cold or flu. Do not treat yourself. This drug decreases your body's ability to fight infections. Try to avoid being around people who are sick. This medicine may increase your risk to bruise or bleed. Call your doctor or health care professional if you notice any unusual bleeding. You may need blood work done while you are taking this medicine. In some patients, this medicine may cause a serious brain infection that may cause death. If you have any problems seeing, thinking, speaking, walking, or standing, tell your doctor right away. If you cannot reach your doctor, urgently seek other source of medical care. Do not become pregnant while taking this medicine. Women should inform their doctor if they wish to become pregnant or think they might be pregnant. There is   a potential for serious side effects to an unborn child. Talk to your health care professional or pharmacist for more information. Do not breast-feed an infant while taking this medicine. Check with your doctor or health care professional if you get an attack of  severe diarrhea, nausea and vomiting, or if you sweat a lot. The loss of too much body fluid can make it dangerous for you to take this medicine. What side effects may I notice from receiving this medicine? Side effects that you should report to your doctor or health care professional as soon as possible: -allergic reactions like skin rash, itching or hives, swelling of the face, lips, or tongue -breathing problems -changes in hearing -changes in vision -fast, irregular heartbeat -feeling faint or lightheaded, falls -pain, tingling, numbness in the hands or feet -right upper belly pain -seizures -swelling of the ankles, feet, hands -unusual bleeding or bruising -unusually weak or tired -vomiting -yellowing of the eyes or skin Side effects that usually do not require medical attention (report to your doctor or health care professional if they continue or are bothersome): -changes in emotions or moods -constipation -diarrhea -loss of appetite -headache -irritation at site where injected -nausea This list may not describe all possible side effects. Call your doctor for medical advice about side effects. You may report side effects to FDA at 1-800-FDA-1088. Where should I keep my medicine? This drug is given in a hospital or clinic and will not be stored at home. NOTE: This sheet is a summary. It may not cover all possible information. If you have questions about this medicine, talk to your doctor, pharmacist, or health care provider.  2015, Elsevier/Gold Standard. (2013-06-25 12:46:32)  

## 2014-11-26 ENCOUNTER — Other Ambulatory Visit: Payer: Self-pay | Admitting: *Deleted

## 2014-11-26 DIAGNOSIS — C8310 Mantle cell lymphoma, unspecified site: Secondary | ICD-10-CM

## 2014-11-27 ENCOUNTER — Other Ambulatory Visit (HOSPITAL_BASED_OUTPATIENT_CLINIC_OR_DEPARTMENT_OTHER): Payer: Medicare Other | Admitting: Lab

## 2014-11-27 ENCOUNTER — Ambulatory Visit (HOSPITAL_BASED_OUTPATIENT_CLINIC_OR_DEPARTMENT_OTHER): Payer: Medicare Other

## 2014-11-27 DIAGNOSIS — C8310 Mantle cell lymphoma, unspecified site: Secondary | ICD-10-CM

## 2014-11-27 DIAGNOSIS — Z5112 Encounter for antineoplastic immunotherapy: Secondary | ICD-10-CM

## 2014-11-27 LAB — MANUAL DIFFERENTIAL (CHCC SATELLITE)
ALC: 0.3 10*3/uL — ABNORMAL LOW (ref 0.6–2.2)
ANC (CHCC HP manual diff): 0.9 10*3/uL — ABNORMAL LOW (ref 1.5–6.7)
Band Neutrophils: 74 % — ABNORMAL HIGH (ref 0–10)
LYMPH: 23 % (ref 14–48)
PLT EST ~~LOC~~: DECREASED
Platelet Morphology: NORMAL
SEG: 3 % — AB (ref 40–75)

## 2014-11-27 LAB — CBC WITH DIFFERENTIAL (CANCER CENTER ONLY)
HCT: 34.4 % — ABNORMAL LOW (ref 34.8–46.6)
HEMOGLOBIN: 12 g/dL (ref 11.6–15.9)
MCH: 27.5 pg (ref 26.0–34.0)
MCHC: 34.9 g/dL (ref 32.0–36.0)
MCV: 79 fL — ABNORMAL LOW (ref 81–101)
PLATELETS: 114 10*3/uL — AB (ref 145–400)
RBC: 4.37 10*6/uL (ref 3.70–5.32)
RDW: 14.2 % (ref 11.1–15.7)
WBC: 1.2 10*3/uL — ABNORMAL LOW (ref 3.9–10.0)

## 2014-11-27 LAB — CMP (CANCER CENTER ONLY)
ALT: 15 U/L (ref 10–47)
AST: 22 U/L (ref 11–38)
Albumin: 3.5 g/dL (ref 3.3–5.5)
Alkaline Phosphatase: 53 U/L (ref 26–84)
BILIRUBIN TOTAL: 0.5 mg/dL (ref 0.20–1.60)
BUN, Bld: 8 mg/dL (ref 7–22)
CO2: 25 mEq/L (ref 18–33)
CREATININE: 0.8 mg/dL (ref 0.6–1.2)
Calcium: 9.5 mg/dL (ref 8.0–10.3)
Chloride: 103 mEq/L (ref 98–108)
Glucose, Bld: 83 mg/dL (ref 73–118)
Potassium: 3.9 mEq/L (ref 3.3–4.7)
Sodium: 135 mEq/L (ref 128–145)
Total Protein: 6.8 g/dL (ref 6.4–8.1)

## 2014-11-27 MED ORDER — ONDANSETRON HCL 8 MG PO TABS
ORAL_TABLET | ORAL | Status: AC
  Filled 2014-11-27: qty 1

## 2014-11-27 MED ORDER — BORTEZOMIB CHEMO SQ INJECTION 3.5 MG (2.5MG/ML)
1.3000 mg/m2 | Freq: Once | INTRAMUSCULAR | Status: AC
  Administered 2014-11-27: 2 mg via SUBCUTANEOUS
  Filled 2014-11-27: qty 2

## 2014-11-27 MED ORDER — ONDANSETRON HCL 8 MG PO TABS
8.0000 mg | ORAL_TABLET | Freq: Once | ORAL | Status: AC
  Administered 2014-11-27: 8 mg via ORAL

## 2014-11-27 NOTE — Patient Instructions (Signed)
Bortezomib injection What is this medicine? BORTEZOMIB (bor TEZ oh mib) is a chemotherapy drug. It slows the growth of cancer cells. This medicine is used to treat multiple myeloma, and certain lymphomas, such as mantle-cell lymphoma. This medicine may be used for other purposes; ask your health care provider or pharmacist if you have questions. COMMON BRAND NAME(S): Velcade What should I tell my health care provider before I take this medicine? They need to know if you have any of these conditions: -diabetes -heart disease -irregular heartbeat -liver disease -on hemodialysis -low blood counts, like low white blood cells, platelets, or hemoglobin -peripheral neuropathy -taking medicine for blood pressure -an unusual or allergic reaction to bortezomib, mannitol, boron, other medicines, foods, dyes, or preservatives -pregnant or trying to get pregnant -breast-feeding How should I use this medicine? This medicine is for injection into a vein or for injection under the skin. It is given by a health care professional in a hospital or clinic setting. Talk to your pediatrician regarding the use of this medicine in children. Special care may be needed. Overdosage: If you think you have taken too much of this medicine contact a poison control center or emergency room at once. NOTE: This medicine is only for you. Do not share this medicine with others. What if I miss a dose? It is important not to miss your dose. Call your doctor or health care professional if you are unable to keep an appointment. What may interact with this medicine? This medicine may interact with the following medications: -ketoconazole -rifampin -ritonavir -St. John's Wort This list may not describe all possible interactions. Give your health care provider a list of all the medicines, herbs, non-prescription drugs, or dietary supplements you use. Also tell them if you smoke, drink alcohol, or use illegal drugs. Some items  may interact with your medicine. What should I watch for while using this medicine? Visit your doctor for checks on your progress. This drug may make you feel generally unwell. This is not uncommon, as chemotherapy can affect healthy cells as well as cancer cells. Report any side effects. Continue your course of treatment even though you feel ill unless your doctor tells you to stop. You may get drowsy or dizzy. Do not drive, use machinery, or do anything that needs mental alertness until you know how this medicine affects you. Do not stand or sit up quickly, especially if you are an older patient. This reduces the risk of dizzy or fainting spells. In some cases, you may be given additional medicines to help with side effects. Follow all directions for their use. Call your doctor or health care professional for advice if you get a fever, chills or sore throat, or other symptoms of a cold or flu. Do not treat yourself. This drug decreases your body's ability to fight infections. Try to avoid being around people who are sick. This medicine may increase your risk to bruise or bleed. Call your doctor or health care professional if you notice any unusual bleeding. You may need blood work done while you are taking this medicine. In some patients, this medicine may cause a serious brain infection that may cause death. If you have any problems seeing, thinking, speaking, walking, or standing, tell your doctor right away. If you cannot reach your doctor, urgently seek other source of medical care. Do not become pregnant while taking this medicine. Women should inform their doctor if they wish to become pregnant or think they might be pregnant. There is   a potential for serious side effects to an unborn child. Talk to your health care professional or pharmacist for more information. Do not breast-feed an infant while taking this medicine. Check with your doctor or health care professional if you get an attack of  severe diarrhea, nausea and vomiting, or if you sweat a lot. The loss of too much body fluid can make it dangerous for you to take this medicine. What side effects may I notice from receiving this medicine? Side effects that you should report to your doctor or health care professional as soon as possible: -allergic reactions like skin rash, itching or hives, swelling of the face, lips, or tongue -breathing problems -changes in hearing -changes in vision -fast, irregular heartbeat -feeling faint or lightheaded, falls -pain, tingling, numbness in the hands or feet -right upper belly pain -seizures -swelling of the ankles, feet, hands -unusual bleeding or bruising -unusually weak or tired -vomiting -yellowing of the eyes or skin Side effects that usually do not require medical attention (report to your doctor or health care professional if they continue or are bothersome): -changes in emotions or moods -constipation -diarrhea -loss of appetite -headache -irritation at site where injected -nausea This list may not describe all possible side effects. Call your doctor for medical advice about side effects. You may report side effects to FDA at 1-800-FDA-1088. Where should I keep my medicine? This drug is given in a hospital or clinic and will not be stored at home. NOTE: This sheet is a summary. It may not cover all possible information. If you have questions about this medicine, talk to your doctor, pharmacist, or health care provider.  2015, Elsevier/Gold Standard. (2013-06-25 12:46:32)  

## 2014-11-28 ENCOUNTER — Ambulatory Visit (HOSPITAL_COMMUNITY)
Admission: RE | Admit: 2014-11-28 | Discharge: 2014-11-28 | Disposition: A | Discharge status: Home / Self Care | Payer: Medicare Other | Source: Ambulatory Visit | Attending: Hematology & Oncology | Admitting: Hematology & Oncology

## 2014-11-28 DIAGNOSIS — C8310 Mantle cell lymphoma, unspecified site: Secondary | ICD-10-CM | POA: Diagnosis not present

## 2014-11-28 LAB — GLUCOSE, CAPILLARY: Glucose-Capillary: 79 mg/dL (ref 70–99)

## 2014-11-28 MED ORDER — FLUDEOXYGLUCOSE F - 18 (FDG) INJECTION
6.1800 | Freq: Once | INTRAVENOUS | Status: AC | PRN
  Administered 2014-11-28: 6.18 via INTRAVENOUS

## 2014-12-03 ENCOUNTER — Other Ambulatory Visit: Payer: Self-pay | Admitting: *Deleted

## 2014-12-03 DIAGNOSIS — C8310 Mantle cell lymphoma, unspecified site: Secondary | ICD-10-CM

## 2014-12-04 ENCOUNTER — Ambulatory Visit (HOSPITAL_BASED_OUTPATIENT_CLINIC_OR_DEPARTMENT_OTHER): Payer: Medicare Other | Admitting: Hematology & Oncology

## 2014-12-04 ENCOUNTER — Encounter: Payer: Self-pay | Admitting: Hematology & Oncology

## 2014-12-04 ENCOUNTER — Other Ambulatory Visit (HOSPITAL_BASED_OUTPATIENT_CLINIC_OR_DEPARTMENT_OTHER): Payer: Medicare Other | Admitting: Lab

## 2014-12-04 ENCOUNTER — Ambulatory Visit (HOSPITAL_BASED_OUTPATIENT_CLINIC_OR_DEPARTMENT_OTHER): Payer: Medicare Other

## 2014-12-04 VITALS — BP 146/73 | HR 90 | Temp 98.3°F | Resp 14 | Ht 62.0 in | Wt 125.0 lb

## 2014-12-04 DIAGNOSIS — C8310 Mantle cell lymphoma, unspecified site: Secondary | ICD-10-CM

## 2014-12-04 DIAGNOSIS — Z5111 Encounter for antineoplastic chemotherapy: Secondary | ICD-10-CM

## 2014-12-04 DIAGNOSIS — Z5112 Encounter for antineoplastic immunotherapy: Secondary | ICD-10-CM

## 2014-12-04 LAB — CMP (CANCER CENTER ONLY)
ALBUMIN: 3.4 g/dL (ref 3.3–5.5)
ALK PHOS: 41 U/L (ref 26–84)
ALT: 22 U/L (ref 10–47)
AST: 26 U/L (ref 11–38)
BILIRUBIN TOTAL: 0.5 mg/dL (ref 0.20–1.60)
BUN, Bld: 8 mg/dL (ref 7–22)
CO2: 26 mEq/L (ref 18–33)
Calcium: 9.3 mg/dL (ref 8.0–10.3)
Chloride: 102 mEq/L (ref 98–108)
Creat: 0.9 mg/dl (ref 0.6–1.2)
Glucose, Bld: 88 mg/dL (ref 73–118)
Potassium: 3.5 mEq/L (ref 3.3–4.7)
SODIUM: 141 meq/L (ref 128–145)
Total Protein: 6.9 g/dL (ref 6.4–8.1)

## 2014-12-04 LAB — CBC WITH DIFFERENTIAL (CANCER CENTER ONLY)
BASO#: 0 10*3/uL (ref 0.0–0.2)
BASO%: 0.4 % (ref 0.0–2.0)
EOS%: 0 % (ref 0.0–7.0)
Eosinophils Absolute: 0 10*3/uL (ref 0.0–0.5)
HCT: 36.5 % (ref 34.8–46.6)
HGB: 12.6 g/dL (ref 11.6–15.9)
LYMPH#: 1.6 10*3/uL (ref 0.9–3.3)
LYMPH%: 29.9 % (ref 14.0–48.0)
MCH: 27.8 pg (ref 26.0–34.0)
MCHC: 34.5 g/dL (ref 32.0–36.0)
MCV: 81 fL (ref 81–101)
MONO#: 2.1 10*3/uL — ABNORMAL HIGH (ref 0.1–0.9)
MONO%: 39.7 % — ABNORMAL HIGH (ref 0.0–13.0)
NEUT#: 1.6 10*3/uL (ref 1.5–6.5)
NEUT%: 30 % — ABNORMAL LOW (ref 39.6–80.0)
Platelets: 274 10*3/uL (ref 145–400)
RBC: 4.53 10*6/uL (ref 3.70–5.32)
RDW: 16.3 % — AB (ref 11.1–15.7)
WBC: 5.2 10*3/uL (ref 3.9–10.0)

## 2014-12-04 LAB — TECHNOLOGIST REVIEW CHCC SATELLITE

## 2014-12-04 MED ORDER — SODIUM CHLORIDE 0.9 % IJ SOLN
10.0000 mL | INTRAMUSCULAR | Status: DC | PRN
  Administered 2014-12-04: 10 mL
  Filled 2014-12-04: qty 10

## 2014-12-04 MED ORDER — ACETAMINOPHEN 325 MG PO TABS
650.0000 mg | ORAL_TABLET | Freq: Once | ORAL | Status: AC
  Administered 2014-12-04: 650 mg via ORAL

## 2014-12-04 MED ORDER — DIPHENHYDRAMINE HCL 25 MG PO CAPS
50.0000 mg | ORAL_CAPSULE | Freq: Once | ORAL | Status: AC
  Administered 2014-12-04: 50 mg via ORAL

## 2014-12-04 MED ORDER — LORAZEPAM 2 MG/ML IJ SOLN
INTRAMUSCULAR | Status: AC
  Filled 2014-12-04: qty 1

## 2014-12-04 MED ORDER — DIPHENHYDRAMINE HCL 25 MG PO CAPS
ORAL_CAPSULE | ORAL | Status: AC
  Filled 2014-12-04: qty 2

## 2014-12-04 MED ORDER — DOXORUBICIN HCL CHEMO IV INJECTION 2 MG/ML
50.0000 mg/m2 | Freq: Once | INTRAVENOUS | Status: AC
  Administered 2014-12-04: 80 mg via INTRAVENOUS
  Filled 2014-12-04: qty 40

## 2014-12-04 MED ORDER — HEPARIN SOD (PORK) LOCK FLUSH 100 UNIT/ML IV SOLN
500.0000 [IU] | Freq: Once | INTRAVENOUS | Status: AC | PRN
  Administered 2014-12-04: 500 [IU]
  Filled 2014-12-04: qty 5

## 2014-12-04 MED ORDER — BORTEZOMIB CHEMO SQ INJECTION 3.5 MG (2.5MG/ML)
1.3000 mg/m2 | Freq: Once | INTRAMUSCULAR | Status: AC
  Administered 2014-12-04: 2 mg via SUBCUTANEOUS
  Filled 2014-12-04: qty 2

## 2014-12-04 MED ORDER — ACETAMINOPHEN 325 MG PO TABS
ORAL_TABLET | ORAL | Status: AC
  Filled 2014-12-04: qty 2

## 2014-12-04 MED ORDER — SODIUM CHLORIDE 0.9 % IV SOLN
Freq: Once | INTRAVENOUS | Status: AC
  Administered 2014-12-04: 09:00:00 via INTRAVENOUS
  Filled 2014-12-04: qty 8

## 2014-12-04 MED ORDER — SODIUM CHLORIDE 0.9 % IV SOLN
375.0000 mg/m2 | Freq: Once | INTRAVENOUS | Status: AC
  Administered 2014-12-04: 600 mg via INTRAVENOUS
  Filled 2014-12-04: qty 60

## 2014-12-04 MED ORDER — LORAZEPAM 2 MG/ML IJ SOLN
0.5000 mg | Freq: Once | INTRAMUSCULAR | Status: AC
  Administered 2014-12-04: 0.5 mg via INTRAVENOUS

## 2014-12-04 MED ORDER — SODIUM CHLORIDE 0.9 % IV SOLN
750.0000 mg/m2 | Freq: Once | INTRAVENOUS | Status: AC
  Administered 2014-12-04: 1200 mg via INTRAVENOUS
  Filled 2014-12-04: qty 60

## 2014-12-04 MED ORDER — SODIUM CHLORIDE 0.9 % IV SOLN
Freq: Once | INTRAVENOUS | Status: AC
  Administered 2014-12-04: 09:00:00 via INTRAVENOUS

## 2014-12-04 NOTE — Progress Notes (Signed)
Hematology and Oncology Follow Up Visit  Shannon Parsons [id number] 04-Oct-1940 74 y.o. 12/04/2014   Principle Diagnosis:   Mantle cell lymphoma (MCIPI = 5)  Current Therapy:    Patient s/p cycle #2 of RV-CAP     Interim History:  Ms.  Shannon Parsons is back for follow-up. She is hanging in pretty well. We did go ahead and repeat her PET scan. PET scan shows no active lymphadenopathy now. Her spleen has decreased. There is a new area in her lung which may be infection. She said she had a little bit of a temperature last week.  She's not had any abdominal pain. She's had some constipation. This might be from her Zofran. I told her to take Zofran once a day after treatment.  She's had no rashes. She's had no bleeding.  She is eating okay although her weight is going down a little bit.  She's had no nausea or vomiting. Per she's quite nervous. I'll give her a little bit of Ativan with treatment today. Hopefully this will help her.  She's had no headache. There's been no mouth sores. Overall, her performance status is ECOG 1-2.  Medications:  Current outpatient prescriptions:  .  ALPRAZolam (XANAX) 0.5 MG tablet, Take 1/2 to 1 tablet 3 x day  as needed for anxiety or sleep (Patient taking differently: Take 0.5 mg by mouth 3 (three) times daily as needed for anxiety or sleep. ), Disp: 90 tablet, Rfl: 3 .  Cholecalciferol (VITAMIN D) 2000 UNITS CAPS, Take 2,000-4,000 capsules by mouth every other day. Patient alternates 1 cap with 2 caps every other day., Disp: , Rfl:  .  famciclovir (FAMVIR) 500 MG tablet, Take 1 tablet (500 mg total) by mouth daily., Disp: 30 tablet, Rfl: 12 .  fluconazole (DIFLUCAN) 100 MG tablet, Take 1 tablet (100 mg total) by mouth daily., Disp: 28 tablet, Rfl: 4 .  levofloxacin (LEVAQUIN) 500 MG tablet, Take 1 tablet (500 mg total) by mouth daily., Disp: 14 tablet, Rfl: 4 .  levothyroxine (SYNTHROID, LEVOTHROID) 50 MCG tablet, TAKE 1 TABLET BY MOUTH ONCE DAILY., Disp: 90  tablet, Rfl: 0 .  lidocaine-prilocaine (EMLA) cream, Apply 1 application topically as needed. Apply to [facility] one hour before access., Disp: 30 g, Rfl: 3 .  megestrol (MEGACE) 400 MG/10ML suspension, Take 10 mLs (400 mg total) by mouth 2 (two) times daily., Disp: 480 mL, Rfl: 0 .  ondansetron (ZOFRAN) 8 MG tablet, Take 1 pill twice a day for 4 days. Start the day after each chemotherapy treatment., Disp: 30 tablet, Rfl: 3 .  predniSONE (DELTASONE) 20 MG tablet, Take 3 tablets (60 mg total) by mouth daily. Take on days 1-5 of chemotherapy., Disp: 120 tablet, Rfl: 2 .  Probiotic Product (PROBIOTIC DAILY PO), Take 1 capsule by mouth daily., Disp: , Rfl:  .  QVAR 80 MCG/ACT inhaler, INHALE 2 PUFFS INTO THE LUNGS 2 TIMES DAILY. (Patient taking differently: INHALE 1 PUFFS INTO THE LUNGS 2 TIMES DAILY.), Disp: 8.7 g, Rfl: 6 .  ranitidine (ZANTAC) 300 MG tablet, Take 300 mg by mouth at bedtime., Disp: , Rfl:  .  Zinc 50 MG TABS, Take 25 mg by mouth 4 (four) times a week. , Disp: , Rfl:  .  acetaminophen (TYLENOL) 325 MG tablet, Take 650 mg by mouth every 6 (six) hours as needed (Pain)., Disp: , Rfl:  .  hyoscyamine (LEVSIN, ANASPAZ) 0.125 MG tablet, Take 1 to 2 tablets every 4 hours as needed for nausea, cramping, bloating  or diarrhea (Patient not taking: Reported on 12/04/2014), Disp: 90 tablet, Rfl: 11 .  LORazepam (ATIVAN) 0.5 MG tablet, Take 1 tablet (0.5 mg total) by mouth every 6 (six) hours as needed (Nausea or vomiting). (Patient not taking: Reported on 11/13/2014), Disp: 30 tablet, Rfl: 0 .  Magnesium 200 MG TABS, Take 1 tablet by mouth 4 (four) times a week. , Disp: , Rfl:  .  prochlorperazine (COMPAZINE) 10 MG tablet, Take 1 tablet (10 mg total) by mouth every 6 (six) hours as needed (Nausea or vomiting). (Patient not taking: Reported on 11/13/2014), Disp: 30 tablet, Rfl: 6  Allergies:  Allergies  Allergen Reactions  . Actonel [Risedronate Sodium]     "made me choke"   . Fosamax [Alendronate]      "made me choke"  . Sulfa Antibiotics     Doesn't remember   . Tetracyclines & Related Rash    Past Medical History, Surgical history, Social history, and Family History were reviewed and updated.  Review of Systems: As above  Physical Exam:  height is 5\' 2"  (1.575 m) and weight is 125 lb (56.7 kg). Her oral temperature is 98.3 F (36.8 C). Her blood pressure is 146/73 and her pulse is 90. Her respiration is 14.   Well-developed and well-nourished white female. Head and neck exam shows no ocular or oral lesions. She has no bilateral cervical or supraclavicular lymph nodes. She has no palpable thyroid. Lungs are clear. Cardiac exam regular rate and rhythm with no murmurs, rubs or bruits. Axillary exam shows no obvious bilateral axillary adenopathy. Abdomen is soft. She has decent bowel sounds. There is no fluid wave. Her spleen tip is just below the left costal margin. There is no hepatomegaly. Back exam shows no tenderness over the spine, ribs or hips. Extremities shows no clubbing, cyanosis or edema. She has good range of motion of her joints. She has good strength in her muscles. Neurological exam is nonfocal. Skin exam shows no rashes, ecchymoses or petechia  Lab Results  Component Value Date   WBC 5.2 12/04/2014   HGB 12.6 12/04/2014   HCT 36.5 12/04/2014   MCV 81 12/04/2014   PLT 274 12/04/2014     Chemistry      Component Value Date/Time   NA 141 12/04/2014 0752   NA 138 09/10/2014 1459   K 3.5 12/04/2014 0752   K 4.3 09/10/2014 1459   CL 102 12/04/2014 0752   CL 102 09/10/2014 1459   CO2 26 12/04/2014 0752   CO2 27 09/10/2014 1459   BUN 8 12/04/2014 0752   BUN 13 09/10/2014 1459   CREATININE 0.9 12/04/2014 0752   CREATININE 0.90 02/27/2014 1845      Component Value Date/Time   CALCIUM 9.3 12/04/2014 0752   CALCIUM 9.4 09/10/2014 1459   ALKPHOS 41 12/04/2014 0752   ALKPHOS 83 09/10/2014 1459   AST 26 12/04/2014 0752   AST 39* 09/10/2014 1459   ALT 22 12/04/2014  0752   ALT 23 09/10/2014 1459   BILITOT 0.50 12/04/2014 0752   BILITOT 0.5 09/10/2014 1459         Impression and Plan: [name] is 74 year old white female. She has mantle cell lymphoma. Again, by the [facility] she is an intermediate risk.  We will proceed with her third cycle of chemotherapy. Her lab work looks quite good. Her physical exam looks better. Again, I cannot palpate any lymph nodes in her neck. The axillary lymph nodes have resolved  After her fourth  cycle of treatment, we will go ahead with another PET scan and see how this looks.  I again explained to them the difficulties with mantle cell lymphoma. I really don't think that she is curable without a transplant. I just am not convinced that she is a transplant candidate .  So far, she's done well. I think if we get her into a remission, we might want to think about a transplant. If, we do not get her to transplant, I would certainly consider her for maintenance Rituxan.  She will come in weekly for her Velcade. We will be checking her blood counts.  We will have to be very diligent with monitoring her blood counts.  I spent about 30 minutes with she and her husband. Volanda Napoleon, MD 3/23/20168:35 AM

## 2014-12-04 NOTE — Patient Instructions (Signed)
[facility] Discharge Instructions for Patients Receiving Chemotherapy  Today you received the following chemotherapy agents Rituxan, Cytoxan, Adriamycin, and Velcade.  To help prevent nausea and vomiting after your treatment, we encourage you to take your nausea medications as directed on the bottle.   If you develop nausea and vomiting that is not controlled by your nausea medication, call the clinic.   BELOW ARE SYMPTOMS THAT SHOULD BE REPORTED IMMEDIATELY:  *FEVER GREATER THAN 100.5 F  *CHILLS WITH OR WITHOUT FEVER  NAUSEA AND VOMITING THAT IS NOT CONTROLLED WITH YOUR NAUSEA MEDICATION  *UNUSUAL SHORTNESS OF BREATH  *UNUSUAL BRUISING OR BLEEDING  TENDERNESS IN MOUTH AND THROAT WITH OR WITHOUT PRESENCE OF ULCERS  *URINARY PROBLEMS  *BOWEL PROBLEMS  UNUSUAL RASH Items with * indicate a potential emergency and should be followed up as soon as possible.  Feel free to call the clinic you have any questions or concerns. The clinic phone number is [phone]. Bortezomib injection What is this medicine? BORTEZOMIB (bor TEZ oh mib) is a chemotherapy drug. It slows the growth of cancer cells. This medicine is used to treat multiple myeloma, and certain lymphomas, such as mantle-cell lymphoma. This medicine may be used for other purposes; ask your health care provider or pharmacist if you have questions. COMMON BRAND NAME(S): Velcade What should I tell my health care provider before I take this medicine? They need to know if you have any of these conditions: -diabetes -heart disease -irregular heartbeat -liver disease -on hemodialysis -low blood counts, like low white blood cells, platelets, or hemoglobin -peripheral neuropathy -taking medicine for blood pressure -an unusual or allergic reaction to bortezomib, mannitol, boron, other medicines, foods, dyes, or preservatives -pregnant or trying to get pregnant -breast-feeding How should I use this  medicine? This medicine is for injection into a vein or for injection under the skin. It is given by a health care professional in a hospital or clinic setting. Talk to your pediatrician regarding the use of this medicine in children. Special care may be needed. Overdosage: If you think you have taken too much of this medicine contact a poison control center or emergency room at once. NOTE: This medicine is only for you. Do not share this medicine with others. What if I miss a dose? It is important not to miss your dose. Call your doctor or health care professional if you are unable to keep an appointment. What may interact with this medicine? This medicine may interact with the following medications: -ketoconazole -rifampin -ritonavir -St. John's Wort This list may not describe all possible interactions. Give your health care provider a list of all the medicines, herbs, non-prescription drugs, or dietary supplements you use. Also tell them if you smoke, drink alcohol, or use illegal drugs. Some items may interact with your medicine. What should I watch for while using this medicine? Visit your doctor for checks on your progress. This drug may make you feel generally unwell. This is not uncommon, as chemotherapy can affect healthy cells as well as cancer cells. Report any side effects. Continue your course of treatment even though you feel ill unless your doctor tells you to stop. You may get drowsy or dizzy. Do not drive, use machinery, or do anything that needs mental alertness until you know how this medicine affects you. Do not stand or sit up quickly, especially if you are an older patient. This reduces the risk of dizzy or fainting spells. In some cases, you may be given additional  medicines to help with side effects. Follow all directions for their use. Call your doctor or health care professional for advice if you get a fever, chills or sore throat, or other symptoms of a cold or flu. Do  not treat yourself. This drug decreases your body's ability to fight infections. Try to avoid being around people who are sick. This medicine may increase your risk to bruise or bleed. Call your doctor or health care professional if you notice any unusual bleeding. You may need blood work done while you are taking this medicine. In some patients, this medicine may cause a serious brain infection that may cause death. If you have any problems seeing, thinking, speaking, walking, or standing, tell your doctor right away. If you cannot reach your doctor, urgently seek other source of medical care. Do not become pregnant while taking this medicine. Women should inform their doctor if they wish to become pregnant or think they might be pregnant. There is a potential for serious side effects to an unborn child. Talk to your health care professional or pharmacist for more information. Do not breast-feed an infant while taking this medicine. Check with your doctor or health care professional if you get an attack of severe diarrhea, nausea and vomiting, or if you sweat a lot. The loss of too much body fluid can make it dangerous for you to take this medicine. What side effects may I notice from receiving this medicine? Side effects that you should report to your doctor or health care professional as soon as possible: -allergic reactions like skin rash, itching or hives, swelling of the face, lips, or tongue -breathing problems -changes in hearing -changes in vision -fast, irregular heartbeat -feeling faint or lightheaded, falls -pain, tingling, numbness in the hands or feet -right upper belly pain -seizures -swelling of the ankles, feet, hands -unusual bleeding or bruising -unusually weak or tired -vomiting -yellowing of the eyes or skin Side effects that usually do not require medical attention (report to your doctor or health care professional if they continue or are bothersome): -changes in emotions  or moods -constipation -diarrhea -loss of appetite -headache -irritation at site where injected -nausea This list may not describe all possible side effects. Call your doctor for medical advice about side effects. You may report side effects to FDA at 1-800-FDA-1088. Where should I keep my medicine? This drug is given in a hospital or clinic and will not be stored at home. NOTE: This sheet is a summary. It may not cover all possible information. If you have questions about this medicine, talk to your doctor, pharmacist, or health care provider.  2015, [employer] Standard. (2013-06-25 12:46:32) Doxorubicin injection What is this medicine? DOXORUBICIN (dox oh ROO bi sin) is a chemotherapy drug. It is used to treat many kinds of cancer like Hodgkin's disease, leukemia, non-Hodgkin's lymphoma, neuroblastoma, sarcoma, and Wilms' tumor. It is also used to treat bladder cancer, breast cancer, lung cancer, ovarian cancer, stomach cancer, and thyroid cancer. This medicine may be used for other purposes; ask your health care provider or pharmacist if you have questions. COMMON BRAND NAME(S): Adriamycin, Adriamycin PFS, Adriamycin RDF, Rubex What should I tell my health care provider before I take this medicine? They need to know if you have any of these conditions: -blood disorders -heart disease, recent heart attack -infection (especially a virus infection such as chickenpox, cold sores, or herpes) -irregular heartbeat -liver disease -recent or ongoing radiation therapy -an unusual or allergic reaction to doxorubicin, other chemotherapy  agents, other medicines, foods, dyes, or preservatives -pregnant or trying to get pregnant -breast-feeding How should I use this medicine? This drug is given as an infusion into a vein. It is administered in a hospital or clinic by a specially trained health care professional. If you have pain, swelling, burning or any unusual feeling around the site of your  injection, tell your health care professional right away. Talk to your pediatrician regarding the use of this medicine in children. Special care may be needed. Overdosage: If you think you have taken too much of this medicine contact a poison control center or emergency room at once. NOTE: This medicine is only for you. Do not share this medicine with others. What if I miss a dose? It is important not to miss your dose. Call your doctor or health care professional if you are unable to keep an appointment. What may interact with this medicine? Do not take this medicine with any of the following medications: -cisapride -droperidol -halofantrine -pimozide -zidovudine This medicine may also interact with the following medications: -chloroquine -chlorpromazine -clarithromycin -cyclophosphamide -cyclosporine -erythromycin -medicines for depression, anxiety, or psychotic disturbances -medicines for irregular heart beat like amiodarone, bepridil, dofetilide, encainide, flecainide, propafenone, quinidine -medicines for seizures like ethotoin, fosphenytoin, phenytoin -medicines for nausea, vomiting like dolasetron, ondansetron, palonosetron -medicines to increase blood counts like filgrastim, pegfilgrastim, sargramostim -methadone -methotrexate -pentamidine -progesterone -vaccines -verapamil Talk to your doctor or health care professional before taking any of these medicines: -acetaminophen -aspirin -ibuprofen -ketoprofen -naproxen This list may not describe all possible interactions. Give your health care provider a list of all the medicines, herbs, non-prescription drugs, or dietary supplements you use. Also tell them if you smoke, drink alcohol, or use illegal drugs. Some items may interact with your medicine. What should I watch for while using this medicine? Your condition will be monitored carefully while you are receiving this medicine. You will need important blood work done  while you are taking this medicine. This drug may make you feel generally unwell. This is not uncommon, as chemotherapy can affect healthy cells as well as cancer cells. Report any side effects. Continue your course of treatment even though you feel ill unless your doctor tells you to stop. Your urine may turn red for a few days after your dose. This is not blood. If your urine is dark or brown, call your doctor. In some cases, you may be given additional medicines to help with side effects. Follow all directions for their use. Call your doctor or health care professional for advice if you get a fever, chills or sore throat, or other symptoms of a cold or flu. Do not treat yourself. This drug decreases your body's ability to fight infections. Try to avoid being around people who are sick. This medicine may increase your risk to bruise or bleed. Call your doctor or health care professional if you notice any unusual bleeding. Be careful brushing and flossing your teeth or using a toothpick because you may get an infection or bleed more easily. If you have any dental work done, tell your dentist you are receiving this medicine. Avoid taking products that contain aspirin, acetaminophen, ibuprofen, naproxen, or ketoprofen unless instructed by your doctor. These medicines may hide a fever. Men and women of childbearing age should use effective birth control methods while using taking this medicine. Do not become pregnant while taking this medicine. There is a potential for serious side effects to an unborn child. Talk to your health care professional  or pharmacist for more information. Do not breast-feed an infant while taking this medicine. Do not let others touch your urine or other body fluids for 5 days after each treatment with this medicine. Caregivers should wear latex gloves to avoid touching body fluids during this time. There is a maximum amount of this medicine you should receive throughout your life.  The amount depends on the medical condition being treated and your overall health. Your doctor will watch how much of this medicine you receive in your lifetime. Tell your doctor if you have taken this medicine before. What side effects may I notice from receiving this medicine? Side effects that you should report to your doctor or health care professional as soon as possible: -allergic reactions like skin rash, itching or hives, swelling of the face, lips, or tongue -low blood counts - this medicine may decrease the number of white blood cells, red blood cells and platelets. You may be at increased risk for infections and bleeding. -signs of infection - fever or chills, cough, sore throat, pain or difficulty passing urine -signs of decreased platelets or bleeding - bruising, pinpoint red spots on the skin, black, tarry stools, blood in the urine -signs of decreased red blood cells - unusually weak or tired, fainting spells, lightheadedness -breathing problems -chest pain -fast, irregular heartbeat -mouth sores -nausea, vomiting -pain, swelling, redness at site where injected -pain, tingling, numbness in the hands or feet -swelling of ankles, feet, or hands -unusual bleeding or bruising Side effects that usually do not require medical attention (report to your doctor or health care professional if they continue or are bothersome): -diarrhea -facial flushing -hair loss -loss of appetite -missed menstrual periods -nail discoloration or damage -red or watery eyes -red colored urine -stomach upset This list may not describe all possible side effects. Call your doctor for medical advice about side effects. You may report side effects to FDA at 1-800-FDA-1088. Where should I keep my medicine? This drug is given in a hospital or clinic and will not be stored at home. NOTE: This sheet is a summary. It may not cover all possible information. If you have questions about this medicine, talk to  your doctor, pharmacist, or health care provider.  2015, [employer] Standard. (2012-12-26 09:54:34) Rituximab injection What is this medicine? RITUXIMAB (ri TUX i mab) is a monoclonal antibody. This medicine changes the way the body's immune system works. It is used commonly to treat non-Hodgkin's lymphoma and other conditions. In cancer cells, this drug targets a specific protein within cancer cells and stops the cancer cells from growing. It is also used to treat rhuematoid arthritis (RA). In RA, this medicine slow the inflammatory process and help reduce joint pain and swelling. This medicine is often used with other cancer or arthritis medications. This medicine may be used for other purposes; ask your health care provider or pharmacist if you have questions. COMMON BRAND NAME(S): Rituxan What should I tell my health care provider before I take this medicine? They need to know if you have any of these conditions: -blood disorders -heart disease -history of hepatitis B -infection (especially a virus infection such as chickenpox, cold sores, or herpes) -irregular heartbeat -kidney disease -lung or breathing disease, like asthma -lupus -an unusual or allergic reaction to rituximab, mouse proteins, other medicines, foods, dyes, or preservatives -pregnant or trying to get pregnant -breast-feeding How should I use this medicine? This medicine is for infusion into a vein. It is administered in a hospital or clinic  by a specially trained health care professional. A special MedGuide will be given to you by the pharmacist with each prescription and refill. Be sure to read this information carefully each time. Talk to your pediatrician regarding the use of this medicine in children. This medicine is not approved for use in children. Overdosage: If you think you have taken too much of this medicine contact a poison control center or emergency room at once. NOTE: This medicine is only for you. Do  not share this medicine with others. What if I miss a dose? It is important not to miss a dose. Call your doctor or health care professional if you are unable to keep an appointment. What may interact with this medicine? -cisplatin -medicines for blood pressure -some other medicines for arthritis -vaccines This list may not describe all possible interactions. Give your health care provider a list of all the medicines, herbs, non-prescription drugs, or dietary supplements you use. Also tell them if you smoke, drink alcohol, or use illegal drugs. Some items may interact with your medicine. What should I watch for while using this medicine? Report any side effects that you notice during your treatment right away, such as changes in your breathing, fever, chills, dizziness or lightheadedness. These effects are more common with the first dose. Visit your prescriber or health care professional for checks on your progress. You will need to have regular blood work. Report any other side effects. The side effects of this medicine can continue after you finish your treatment. Continue your course of treatment even though you feel ill unless your doctor tells you to stop. Call your doctor or health care professional for advice if you get a fever, chills or sore throat, or other symptoms of a cold or flu. Do not treat yourself. This drug decreases your body's ability to fight infections. Try to avoid being around people who are sick. This medicine may increase your risk to bruise or bleed. Call your doctor or health care professional if you notice any unusual bleeding. Be careful brushing and flossing your teeth or using a toothpick because you may get an infection or bleed more easily. If you have any dental work done, tell your dentist you are receiving this medicine. Avoid taking products that contain aspirin, acetaminophen, ibuprofen, naproxen, or ketoprofen unless instructed by your doctor. These medicines  may hide a fever. Do not become pregnant while taking this medicine. Women should inform their doctor if they wish to become pregnant or think they might be pregnant. There is a potential for serious side effects to an unborn child. Talk to your health care professional or pharmacist for more information. Do not breast-feed an infant while taking this medicine. What side effects may I notice from receiving this medicine? Side effects that you should report to your doctor or health care professional as soon as possible: -allergic reactions like skin rash, itching or hives, swelling of the face, lips, or tongue -low blood counts - this medicine may decrease the number of white blood cells, red blood cells and platelets. You may be at increased risk for infections and bleeding. -signs of infection - fever or chills, cough, sore throat, pain or difficulty passing urine -signs of decreased platelets or bleeding - bruising, pinpoint red spots on the skin, black, tarry stools, blood in the urine -signs of decreased red blood cells - unusually weak or tired, fainting spells, lightheadedness -breathing problems -confused, not responsive -chest pain -fast, irregular heartbeat -feeling faint or lightheaded,  falls -mouth sores -redness, blistering, peeling or loosening of the skin, including inside the mouth -stomach pain -swelling of the ankles, feet, or hands -trouble passing urine or change in the amount of urine Side effects that usually do not require medical attention (report to your doctor or other health care professional if they continue or are bothersome): -anxiety -headache -loss of appetite -muscle aches -nausea -night sweats This list may not describe all possible side effects. Call your doctor for medical advice about side effects. You may report side effects to FDA at 1-800-FDA-1088. Where should I keep my medicine? This drug is given in a hospital or clinic and will not be stored at  home. NOTE: This sheet is a summary. It may not cover all possible information. If you have questions about this medicine, talk to your doctor, pharmacist, or health care provider.  2015, [employer]/Gold Standard. (2008-04-29 14:04:59) Cyclophosphamide injection What is this medicine? CYCLOPHOSPHAMIDE (sye kloe FOSS fa mide) is a chemotherapy drug. It slows the growth of cancer cells. This medicine is used to treat many types of cancer like lymphoma, myeloma, leukemia, breast cancer, and ovarian cancer, to name a few. This medicine may be used for other purposes; ask your health care provider or pharmacist if you have questions. COMMON BRAND NAME(S): Cytoxan, Neosar What should I tell my health care provider before I take this medicine? They need to know if you have any of these conditions: -blood disorders -history of other chemotherapy -infection -kidney disease -liver disease -recent or ongoing radiation therapy -tumors in the bone marrow -an unusual or allergic reaction to cyclophosphamide, other chemotherapy, other medicines, foods, dyes, or preservatives -pregnant or trying to get pregnant -breast-feeding How should I use this medicine? This drug is usually given as an injection into a vein or muscle or by infusion into a vein. It is administered in a hospital or clinic by a specially trained health care professional. Talk to your pediatrician regarding the use of this medicine in children. Special care may be needed. Overdosage: If you think you have taken too much of this medicine contact a poison control center or emergency room at once. NOTE: This medicine is only for you. Do not share this medicine with others. What if I miss a dose? It is important not to miss your dose. Call your doctor or health care professional if you are unable to keep an appointment. What may interact with this medicine? This medicine may interact with the following  medications: -amiodarone -amphotericin B -azathioprine -certain antiviral medicines for HIV or AIDS such as protease inhibitors (e.g., indinavir, ritonavir) and zidovudine -certain blood pressure medications such as benazepril, captopril, enalapril, fosinopril, lisinopril, moexipril, monopril, perindopril, quinapril, ramipril, trandolapril -certain cancer medications such as anthracyclines (e.g., daunorubicin, doxorubicin), busulfan, cytarabine, paclitaxel, pentostatin, tamoxifen, trastuzumab -certain diuretics such as chlorothiazide, chlorthalidone, hydrochlorothiazide, indapamide, metolazone -certain medicines that treat or prevent blood clots like warfarin -certain muscle relaxants such as succinylcholine -cyclosporine -etanercept -indomethacin -medicines to increase blood counts like filgrastim, pegfilgrastim, sargramostim -medicines used as general anesthesia -metronidazole -natalizumab This list may not describe all possible interactions. Give your health care provider a list of all the medicines, herbs, non-prescription drugs, or dietary supplements you use. Also tell them if you smoke, drink alcohol, or use illegal drugs. Some items may interact with your medicine. What should I watch for while using this medicine? Visit your doctor for checks on your progress. This drug may make you feel generally unwell. This is not uncommon, as chemotherapy  can affect healthy cells as well as cancer cells. Report any side effects. Continue your course of treatment even though you feel ill unless your doctor tells you to stop. Drink water or other fluids as directed. Urinate often, even at night. In some cases, you may be given additional medicines to help with side effects. Follow all directions for their use. Call your doctor or health care professional for advice if you get a fever, chills or sore throat, or other symptoms of a cold or flu. Do not treat yourself. This drug decreases your body's  ability to fight infections. Try to avoid being around people who are sick. This medicine may increase your risk to bruise or bleed. Call your doctor or health care professional if you notice any unusual bleeding. Be careful brushing and flossing your teeth or using a toothpick because you may get an infection or bleed more easily. If you have any dental work done, tell your dentist you are receiving this medicine. You may get drowsy or dizzy. Do not drive, use machinery, or do anything that needs mental alertness until you know how this medicine affects you. Do not become pregnant while taking this medicine or for 1 year after stopping it. Women should inform their doctor if they wish to become pregnant or think they might be pregnant. Men should not father a child while taking this medicine and for 4 months after stopping it. There is a potential for serious side effects to an unborn child. Talk to your health care professional or pharmacist for more information. Do not breast-feed an infant while taking this medicine. This medicine may interfere with the ability to have a child. This medicine has caused ovarian failure in some women. This medicine has caused reduced sperm counts in some men. You should talk with your doctor or health care professional if you are concerned about your fertility. If you are going to have surgery, tell your doctor or health care professional that you have taken this medicine. What side effects may I notice from receiving this medicine? Side effects that you should report to your doctor or health care professional as soon as possible: -allergic reactions like skin rash, itching or hives, swelling of the face, lips, or tongue -low blood counts - this medicine may decrease the number of white blood cells, red blood cells and platelets. You may be at increased risk for infections and bleeding. -signs of infection - fever or chills, cough, sore throat, pain or difficulty  passing urine -signs of decreased platelets or bleeding - bruising, pinpoint red spots on the skin, black, tarry stools, blood in the urine -signs of decreased red blood cells - unusually weak or tired, fainting spells, lightheadedness -breathing problems -dark urine -dizziness -palpitations -swelling of the ankles, feet, hands -trouble passing urine or change in the amount of urine -weight gain -yellowing of the eyes or skin Side effects that usually do not require medical attention (report to your doctor or health care professional if they continue or are bothersome): -changes in nail or skin color -hair loss -missed menstrual periods -mouth sores -nausea, vomiting This list may not describe all possible side effects. Call your doctor for medical advice about side effects. You may report side effects to FDA at 1-800-FDA-1088. Where should I keep my medicine? This drug is given in a hospital or clinic and will not be stored at home. NOTE: This sheet is a summary. It may not cover all possible information. If you have questions  about this medicine, talk to your doctor, pharmacist, or health care provider.  2015, [employer]vier/[employer]. (2012-07-14 16:22:58)

## 2014-12-06 ENCOUNTER — Telehealth: Payer: Self-pay | Admitting: *Deleted

## 2014-12-06 NOTE — Telephone Encounter (Signed)
Pt c/o left eye hemorrhage. She woke up this morning with no further symptoms. When she was in front of a mirror saw the redness. Picture sent to office for evaluation. No c/o pain or changes to vision. Reviewed with Dr Marin Olp. Informed the patient to monitor the bleeding, but that most likely this was a non-urgent issue that would resolve on its own. Told patient that if eye started to hurt, or if vision changes occurred, to call the office back, or go to the emergency room. She was in agreement with plan.

## 2014-12-09 ENCOUNTER — Encounter: Payer: Self-pay | Admitting: Nurse Practitioner

## 2014-12-09 NOTE — Progress Notes (Signed)
Pt called and complained of constipation. Per Judson Roch, NP pt was instructed to continue taking Miralax but to increase to twice per day. She has also been told for today to take a half bottle of mag citrate to facilitate a bm since she stated it is going on day #5. If no results in 4 hours she may proceed to take remainder of bottle. Pt verbalized understanding and asked to call back within 24hrs if no results.

## 2014-12-11 ENCOUNTER — Ambulatory Visit (HOSPITAL_BASED_OUTPATIENT_CLINIC_OR_DEPARTMENT_OTHER): Payer: Medicare Other

## 2014-12-11 ENCOUNTER — Other Ambulatory Visit (HOSPITAL_BASED_OUTPATIENT_CLINIC_OR_DEPARTMENT_OTHER): Payer: Medicare Other

## 2014-12-11 DIAGNOSIS — C8310 Mantle cell lymphoma, unspecified site: Secondary | ICD-10-CM

## 2014-12-11 DIAGNOSIS — Z5112 Encounter for antineoplastic immunotherapy: Secondary | ICD-10-CM

## 2014-12-11 LAB — CBC WITH DIFFERENTIAL (CANCER CENTER ONLY)
BASO#: 0 10*3/uL (ref 0.0–0.2)
BASO%: 0.4 % (ref 0.0–2.0)
EOS%: 0.4 % (ref 0.0–7.0)
Eosinophils Absolute: 0 10*3/uL (ref 0.0–0.5)
HCT: 35.6 % (ref 34.8–46.6)
HGB: 12.2 g/dL (ref 11.6–15.9)
LYMPH#: 1 10*3/uL (ref 0.9–3.3)
LYMPH%: 41.3 % (ref 14.0–48.0)
MCH: 27.7 pg (ref 26.0–34.0)
MCHC: 34.3 g/dL (ref 32.0–36.0)
MCV: 81 fL (ref 81–101)
MONO#: 0 10*3/uL — ABNORMAL LOW (ref 0.1–0.9)
MONO%: 0.9 % (ref 0.0–13.0)
NEUT#: 1.3 10*3/uL — ABNORMAL LOW (ref 1.5–6.5)
NEUT%: 57 % (ref 39.6–80.0)
PLATELETS: 124 10*3/uL — AB (ref 145–400)
RBC: 4.41 10*6/uL (ref 3.70–5.32)
RDW: 15.8 % — AB (ref 11.1–15.7)
WBC: 2.3 10*3/uL — ABNORMAL LOW (ref 3.9–10.0)

## 2014-12-11 LAB — CMP (CANCER CENTER ONLY)
ALBUMIN: 3.4 g/dL (ref 3.3–5.5)
ALT(SGPT): 13 U/L (ref 10–47)
AST: 21 U/L (ref 11–38)
Alkaline Phosphatase: 37 U/L (ref 26–84)
BUN, Bld: 14 mg/dL (ref 7–22)
CALCIUM: 9.3 mg/dL (ref 8.0–10.3)
CHLORIDE: 102 meq/L (ref 98–108)
CO2: 25 mEq/L (ref 18–33)
Creat: 1 mg/dl (ref 0.6–1.2)
Glucose, Bld: 86 mg/dL (ref 73–118)
POTASSIUM: 3.7 meq/L (ref 3.3–4.7)
SODIUM: 135 meq/L (ref 128–145)
Total Bilirubin: 0.8 mg/dl (ref 0.20–1.60)
Total Protein: 6.4 g/dL (ref 6.4–8.1)

## 2014-12-11 LAB — TECHNOLOGIST REVIEW CHCC SATELLITE

## 2014-12-11 LAB — LACTATE DEHYDROGENASE: LDH: 202 U/L (ref 94–250)

## 2014-12-11 MED ORDER — BORTEZOMIB CHEMO SQ INJECTION 3.5 MG (2.5MG/ML)
1.3000 mg/m2 | Freq: Once | INTRAMUSCULAR | Status: AC
  Administered 2014-12-11: 2 mg via SUBCUTANEOUS
  Filled 2014-12-11: qty 2

## 2014-12-11 MED ORDER — ONDANSETRON HCL 8 MG PO TABS
ORAL_TABLET | ORAL | Status: AC
  Filled 2014-12-11: qty 1

## 2014-12-11 MED ORDER — ONDANSETRON HCL 8 MG PO TABS
8.0000 mg | ORAL_TABLET | Freq: Once | ORAL | Status: AC
  Administered 2014-12-11: 8 mg via ORAL

## 2014-12-11 NOTE — Patient Instructions (Signed)
Bortezomib injection What is this medicine? BORTEZOMIB (bor TEZ oh mib) is a chemotherapy drug. It slows the growth of cancer cells. This medicine is used to treat multiple myeloma, and certain lymphomas, such as mantle-cell lymphoma. This medicine may be used for other purposes; ask your health care provider or pharmacist if you have questions. COMMON BRAND NAME(S): Velcade What should I tell my health care provider before I take this medicine? They need to know if you have any of these conditions: -diabetes -heart disease -irregular heartbeat -liver disease -on hemodialysis -low blood counts, like low white blood cells, platelets, or hemoglobin -peripheral neuropathy -taking medicine for blood pressure -an unusual or allergic reaction to bortezomib, mannitol, boron, other medicines, foods, dyes, or preservatives -pregnant or trying to get pregnant -breast-feeding How should I use this medicine? This medicine is for injection into a vein or for injection under the skin. It is given by a health care professional in a hospital or clinic setting. Talk to your pediatrician regarding the use of this medicine in children. Special care may be needed. Overdosage: If you think you have taken too much of this medicine contact a poison control center or emergency room at once. NOTE: This medicine is only for you. Do not share this medicine with others. What if I miss a dose? It is important not to miss your dose. Call your doctor or health care professional if you are unable to keep an appointment. What may interact with this medicine? This medicine may interact with the following medications: -ketoconazole -rifampin -ritonavir -St. John's Wort This list may not describe all possible interactions. Give your health care provider a list of all the medicines, herbs, non-prescription drugs, or dietary supplements you use. Also tell them if you smoke, drink alcohol, or use illegal drugs. Some items  may interact with your medicine. What should I watch for while using this medicine? Visit your doctor for checks on your progress. This drug may make you feel generally unwell. This is not uncommon, as chemotherapy can affect healthy cells as well as cancer cells. Report any side effects. Continue your course of treatment even though you feel ill unless your doctor tells you to stop. You may get drowsy or dizzy. Do not drive, use machinery, or do anything that needs mental alertness until you know how this medicine affects you. Do not stand or sit up quickly, especially if you are an older patient. This reduces the risk of dizzy or fainting spells. In some cases, you may be given additional medicines to help with side effects. Follow all directions for their use. Call your doctor or health care professional for advice if you get a fever, chills or sore throat, or other symptoms of a cold or flu. Do not treat yourself. This drug decreases your body's ability to fight infections. Try to avoid being around people who are sick. This medicine may increase your risk to bruise or bleed. Call your doctor or health care professional if you notice any unusual bleeding. You may need blood work done while you are taking this medicine. In some patients, this medicine may cause a serious brain infection that may cause death. If you have any problems seeing, thinking, speaking, walking, or standing, tell your doctor right away. If you cannot reach your doctor, urgently seek other source of medical care. Do not become pregnant while taking this medicine. Women should inform their doctor if they wish to become pregnant or think they might be pregnant. There is   a potential for serious side effects to an unborn child. Talk to your health care professional or pharmacist for more information. Do not breast-feed an infant while taking this medicine. Check with your doctor or health care professional if you get an attack of  severe diarrhea, nausea and vomiting, or if you sweat a lot. The loss of too much body fluid can make it dangerous for you to take this medicine. What side effects may I notice from receiving this medicine? Side effects that you should report to your doctor or health care professional as soon as possible: -allergic reactions like skin rash, itching or hives, swelling of the face, lips, or tongue -breathing problems -changes in hearing -changes in vision -fast, irregular heartbeat -feeling faint or lightheaded, falls -pain, tingling, numbness in the hands or feet -right upper belly pain -seizures -swelling of the ankles, feet, hands -unusual bleeding or bruising -unusually weak or tired -vomiting -yellowing of the eyes or skin Side effects that usually do not require medical attention (report to your doctor or health care professional if they continue or are bothersome): -changes in emotions or moods -constipation -diarrhea -loss of appetite -headache -irritation at site where injected -nausea This list may not describe all possible side effects. Call your doctor for medical advice about side effects. You may report side effects to FDA at 1-800-FDA-1088. Where should I keep my medicine? This drug is given in a hospital or clinic and will not be stored at home. NOTE: This sheet is a summary. It may not cover all possible information. If you have questions about this medicine, talk to your doctor, pharmacist, or health care provider.  2015, Elsevier/Gold Standard. (2013-06-25 12:46:32)  

## 2014-12-16 ENCOUNTER — Telehealth: Payer: Self-pay | Admitting: *Deleted

## 2014-12-16 ENCOUNTER — Encounter: Payer: Self-pay | Admitting: Physician Assistant

## 2014-12-16 ENCOUNTER — Ambulatory Visit (INDEPENDENT_AMBULATORY_CARE_PROVIDER_SITE_OTHER): Payer: Medicare Other | Admitting: Physician Assistant

## 2014-12-16 VITALS — BP 110/66 | HR 84 | Temp 97.8°F | Resp 16 | Ht 63.5 in | Wt 126.0 lb

## 2014-12-16 DIAGNOSIS — R29898 Other symptoms and signs involving the musculoskeletal system: Secondary | ICD-10-CM

## 2014-12-16 DIAGNOSIS — L03011 Cellulitis of right finger: Secondary | ICD-10-CM

## 2014-12-16 DIAGNOSIS — E559 Vitamin D deficiency, unspecified: Secondary | ICD-10-CM

## 2014-12-16 DIAGNOSIS — F411 Generalized anxiety disorder: Secondary | ICD-10-CM

## 2014-12-16 DIAGNOSIS — C8590 Non-Hodgkin lymphoma, unspecified, unspecified site: Secondary | ICD-10-CM

## 2014-12-16 LAB — IRON AND TIBC
%SAT: 29 % (ref 20–55)
Iron: 82 ug/dL (ref 42–145)
TIBC: 285 ug/dL (ref 250–470)
UIBC: 203 ug/dL (ref 125–400)

## 2014-12-16 LAB — FERRITIN: Ferritin: 776 ng/mL — ABNORMAL HIGH (ref 10–291)

## 2014-12-16 LAB — CK: CK TOTAL: 40 U/L (ref 7–177)

## 2014-12-16 LAB — VITAMIN B12: VITAMIN B 12: 202 pg/mL — AB (ref 211–911)

## 2014-12-16 LAB — MAGNESIUM: MAGNESIUM: 1.9 mg/dL (ref 1.5–2.5)

## 2014-12-16 LAB — TSH: TSH: 3.049 u[IU]/mL (ref 0.350–4.500)

## 2014-12-16 NOTE — Telephone Encounter (Signed)
Patient c/o infected cuticle. No complaints of swelling or fever. Redness is localized to just around the nail bed. Informed patient to call her PCP and see if they could see her. She has a scheduled appointment here this Wednesday (12/18/14). Patient is in agreement with plan.

## 2014-12-16 NOTE — Progress Notes (Signed)
Subjective:    Patient ID: Shannon Parsons, female    DOB: [birth year], 74 y.o.   MRN: [id number]  HPI 74 y.o. WF with history of HTN, GERD, preDM, diverticulosis, anxiety, chol, vitamin D def being treated for Mantle cell lympoma at the cancer center with Dr. Marin Olp presents with right 4th finger swelling, leg weakness, fatigue, and left ear pressure/popping.  She states her right 4th finger has had swelling x 1 week, gotten worse last 3 days, very sore, tender, warmth and redness, no injury. She states Dr. Marin Olp has put her on Levaquin daily.  She also complains of left ear/neck pressure worse with swallowing for the last few weeks, has gotten better today. She does have post nasal drip, nonproductive cough.   She also states she is losing strength in her legs and feels she is getting weaker, has fallen 1 time while trying to do bands at home to strengthen. She denies lower back pain, numbness/tingling in legs, incontinence, urinary symptoms, fever, or chills.    Review of Systems  Constitutional: Positive for appetite change and fatigue. Negative for fever, chills, diaphoresis, activity change and unexpected weight change.  HENT: Positive for congestion, ear pain, postnasal drip and sore throat. Negative for dental problem, ear discharge, facial swelling, hearing loss, mouth sores, nosebleeds, rhinorrhea, sinus pressure, sneezing, tinnitus, trouble swallowing and voice change.   Respiratory: Positive for cough. Negative for apnea, choking, chest tightness, shortness of breath, wheezing and stridor.   Cardiovascular: Negative.   Gastrointestinal: Negative for nausea, vomiting, abdominal pain, diarrhea, constipation, blood in stool, abdominal distention, anal bleeding and rectal pain.  Genitourinary: Negative.   Musculoskeletal: Positive for gait problem. Negative for myalgias, back pain, joint swelling, neck pain and neck stiffness.  Skin: Negative.   Neurological: Positive for tremors and  weakness. Negative for dizziness, seizures, syncope, facial asymmetry, speech difficulty, light-headedness, numbness and headaches.  Hematological: Negative.   Psychiatric/Behavioral: Negative.        Objective:   Physical Exam  Constitutional: She appears well-developed and well-nourished.  HENT:  Head: Normocephalic and atraumatic.  Left Ear: A middle ear effusion is present.  Mouth/Throat: Uvula is midline, oropharynx is clear and moist and mucous membranes are normal.  Right ear with cerumen impaction.   Eyes: Conjunctivae are normal. Pupils are equal, round, and reactive to light.  Neck: Normal range of motion. Neck supple.  Cardiovascular: Normal rate and regular rhythm.   Pulmonary/Chest: Effort normal and breath sounds normal.  Abdominal: Soft. She exhibits no distension. There is no tenderness. There is no rebound.  Musculoskeletal: Normal range of motion.  Good strength bilateral legs, normal distal pulses.   Lymphadenopathy:    She has no cervical adenopathy.  Neurological: She is alert. She has normal strength and normal reflexes. She displays tremor (unchanged). She displays no atrophy. No cranial nerve deficit or sensory deficit. Gait normal.  Psychiatric: Her mood appears anxious.        Assessment & Plan:  Cerumen impaction, right ear- stop using Qtips, use OTC drops/oil, if not better come to the office for irrigation Fatigue- likely due to chemo, will check labs Muscle weakness bilateral legs, normal reflexes/strength- suggest PT, check labs Paronychia- I&D in the office with a mixture of pus and blood expressed with immediate relief, wrapped in the office, and she is currently on levaquin will continue for now Anxiety- declines zoloft or any SSRI, will do xanax q 6 hours, check TSH, labs.  Lymphoma- continue follow up cancer center.  Keep follow up in 1 month with Dr. Melford Aase.

## 2014-12-16 NOTE — Patient Instructions (Addendum)
Paronychia  Paronychia is an infection of the skin caused by germs. It happens by the fingernail or toenail. You can avoid it by not:  Pulling on hangnails.  Nail biting.  Thumb sucking.  Cutting fingernails and toenails too short.  Cutting the skin at the base and sides of the fingernail or toenail (cuticle). HOME CARE  Keep the fingers or toes very dry. Put rubber gloves over cotton gloves when putting hands in water.  Keep the wound clean and bandaged (dressed) as told by your doctor.  Soak the fingers or toes in warm water for 15 to 20 minutes. Soak them 3 to 4 times per day for germ infections. Fungal infections are difficult to treat. Fungal infections often require treatment for a long time.  Only take medicine as told by your doctor. GET HELP RIGHT AWAY IF:   You have redness, puffiness (swelling), or pain that gets worse.  You see yellowish-white fluid (pus) coming from the wound.  You have a fever.  You have a bad smell coming from the wound or bandage. MAKE SURE YOU:  Understand these instructions.  Will watch your condition.  Will get help if you are not doing well or get worse. Document Released: 08/18/2009 Document Revised: 11/22/2011 Document Reviewed: 08/18/2009 [facility] Patient Information 2015 [place], [place]. This information is not intended to replace advice given to you by your health care provider. Make sure you discuss any questions you have with your health care provider.  Use a dropper or use a cap to put olive oil,mineral oil or canola oil in the effected ear- 2-3 times a week. Let it soak for 20-30 min then you can take a shower or use a baby bulb with warm water to wash out the ear wax.  Do not use Qtips

## 2014-12-17 ENCOUNTER — Ambulatory Visit: Payer: Self-pay | Admitting: Physician Assistant

## 2014-12-17 ENCOUNTER — Other Ambulatory Visit: Payer: Self-pay | Admitting: *Deleted

## 2014-12-17 DIAGNOSIS — C8310 Mantle cell lymphoma, unspecified site: Secondary | ICD-10-CM

## 2014-12-17 LAB — VITAMIN D 25 HYDROXY (VIT D DEFICIENCY, FRACTURES): Vit D, 25-Hydroxy: 35 ng/mL (ref 30–100)

## 2014-12-17 LAB — SEDIMENTATION RATE: SED RATE: 20 mm/h (ref 0–30)

## 2014-12-18 ENCOUNTER — Ambulatory Visit: Payer: Medicare Other

## 2014-12-18 ENCOUNTER — Other Ambulatory Visit (HOSPITAL_BASED_OUTPATIENT_CLINIC_OR_DEPARTMENT_OTHER): Payer: Medicare Other

## 2014-12-18 DIAGNOSIS — C8310 Mantle cell lymphoma, unspecified site: Secondary | ICD-10-CM

## 2014-12-18 LAB — MANUAL DIFFERENTIAL (CHCC SATELLITE)
ALC: 0.6 10*3/uL (ref 0.6–2.2)
ANC (CHCC HP manual diff): 0 10*3/uL — CL (ref 1.5–6.7)
LYMPH: 64 % — AB (ref 14–48)
MONO: 36 % — AB (ref 0–13)
PLT EST ~~LOC~~: DECREASED

## 2014-12-18 LAB — CMP (CANCER CENTER ONLY)
ALK PHOS: 30 U/L (ref 26–84)
ALT(SGPT): 17 U/L (ref 10–47)
AST: 22 U/L (ref 11–38)
Albumin: 3.6 g/dL (ref 3.3–5.5)
BILIRUBIN TOTAL: 0.5 mg/dL (ref 0.20–1.60)
BUN: 12 mg/dL (ref 7–22)
CO2: 22 mEq/L (ref 18–33)
Calcium: 8.9 mg/dL (ref 8.0–10.3)
Chloride: 102 mEq/L (ref 98–108)
Creat: 0.9 mg/dl (ref 0.6–1.2)
GLUCOSE: 86 mg/dL (ref 73–118)
Potassium: 3.6 mEq/L (ref 3.3–4.7)
Sodium: 136 mEq/L (ref 128–145)
Total Protein: 6.6 g/dL (ref 6.4–8.1)

## 2014-12-18 LAB — CBC WITH DIFFERENTIAL (CANCER CENTER ONLY)
HCT: 30.7 % — ABNORMAL LOW (ref 34.8–46.6)
HGB: 10.7 g/dL — ABNORMAL LOW (ref 11.6–15.9)
MCH: 27.6 pg (ref 26.0–34.0)
MCHC: 34.9 g/dL (ref 32.0–36.0)
MCV: 79 fL — ABNORMAL LOW (ref 81–101)
PLATELETS: 112 10*3/uL — AB (ref 145–400)
RBC: 3.87 10*6/uL (ref 3.70–5.32)
RDW: 15.4 % (ref 11.1–15.7)
WBC: 0.9 10*3/uL — CL (ref 3.9–10.0)

## 2014-12-18 MED ORDER — BORTEZOMIB CHEMO SQ INJECTION 3.5 MG (2.5MG/ML)
1.3000 mg/m2 | Freq: Once | INTRAMUSCULAR | Status: DC
  Filled 2014-12-18: qty 0.8

## 2014-12-18 MED ORDER — ONDANSETRON HCL 8 MG PO TABS: 8.0000 mg | ORAL_TABLET | Freq: Once | ORAL | Status: DC

## 2014-12-18 NOTE — Patient Instructions (Addendum)
Neutropenia Neutropenia is a condition that occurs when the level of a certain type of white blood cell (neutrophil) in your body becomes lower than normal. Neutrophils are made in the bone marrow and fight infections. These cells protect against bacteria and viruses. The fewer neutrophils you have, and the longer your body remains without them, the greater your risk of getting a severe infection becomes. CAUSES  The cause of neutropenia may be hard to determine. However, it is usually due to 3 main problems:   Decreased production of neutrophils. This may be due to:  Certain medicines such as chemotherapy.  Genetic problems.  Cancer.  Radiation treatments.  Vitamin deficiency.  Some pesticides.  Increased destruction of neutrophils. This may be due to:  Overwhelming infections.  Hemolytic anemia. This is when the body destroys its own blood cells.  Chemotherapy.  Neutrophils moving to areas of the body where they cannot fight infections. This may be due to:  Dialysis procedures.  Conditions where the spleen becomes enlarged. Neutrophils are held in the spleen and are not available to the rest of the body.  Overwhelming infections. The neutrophils are held in the area of the infection and are not available to the rest of the body. SYMPTOMS  There are no specific symptoms of neutropenia. The lack of neutrophils can result in an infection, and an infection can cause various problems. DIAGNOSIS  Diagnosis is made by a blood test. A complete blood count is performed. The normal level of neutrophils in human blood differs with age and race. Infants have lower counts than older children and adults. African Americans have lower counts than Caucasians or Asians. The average adult level is 1500 cells/mm3 of blood. Neutrophil counts are interpreted as follows:  Greater than 1000 cells/mm3 gives normal protection against infection.  500 to 1000 cells/mm3 gives an increased risk for  infection.  200 to 500 cells/mm3 is a greater risk for severe infection.  Lower than 200 cells/mm3 is a marked risk of infection. This may require hospitalization and treatment with antibiotic medicines. TREATMENT  Treatment depends on the underlying cause, severity, and presence of infections or symptoms. It also depends on your health. Your caregiver will discuss the treatment plan with you. Mild cases are often easily treated and have a good outcome. Preventative measures may also be started to limit your risk of infections. Treatment can include:  Taking antibiotics.  Stopping medicines that are known to cause neutropenia.  Correcting nutritional deficiencies by eating green vegetables to supply folic acid and taking vitamin B supplements.  Stopping exposure to pesticides if your neutropenia is related to pesticide exposure.  Taking a blood growth factor called sargramostim, pegfilgrastim, or filgrastim if you are undergoing chemotherapy for cancer. This stimulates white blood cell production.  Removal of the spleen if you have Felty's syndrome and have repeated infections. HOME CARE INSTRUCTIONS   Follow your caregiver's instructions about when you need to have blood work done.  Wash your hands often. Make sure others who come in contact with you also wash their hands.  Wash raw fruits and vegetables before eating them. They can carry bacteria and fungi.  Avoid people with colds or spreadable (contagious) diseases (chickenpox, herpes zoster, influenza).  Avoid large crowds.  Avoid construction areas. The dust can release fungus into the air.  Be cautious around children in daycare or school environments.  Take care of your respiratory system by coughing and deep breathing.  Bathe daily.  Protect your skin from cuts and   burns.  Do not work in the garden or with flowers and plants.  Care for the mouth before and after meals by brushing with a soft toothbrush. If you have  mucositis, do not use mouthwash. Mouthwash contains alcohol and can dry out the mouth even more.  Clean the area between the genitals and the anus (perineal area) after urination and bowel movements. Women need to wipe from front to back.  Use a water soluble lubricant during sexual intercourse and practice good hygiene after. Do not have intercourse if you are severely neutropenic. Check with your caregiver for guidelines.  Exercise daily as tolerated.  Avoid people who were vaccinated with a live vaccine in the past 30 days. You should not receive live vaccines (polio, typhoid).  Do not provide direct care for pets. Avoid animal droppings. Do not clean litter boxes and bird cages.  Do not share food utensils.  Do not use tampons, enemas, or rectal suppositories unless directed by your caregiver.  Use an electric razor to remove hair.  Wash your hands after handling magazines, letters, and newspapers. SEEK IMMEDIATE MEDICAL CARE IF:   You have a fever.  You have chills or start to shake.  You feel nauseous or vomit.  You develop mouth sores.  You develop aches and pains.  You have redness and swelling around open wounds.  Your skin is warm to the touch.  You have pus coming from your wounds.  You develop swollen lymph nodes.  You feel weak or fatigued.  You develop red streaks on the skin. MAKE SURE YOU:  Understand these instructions.  Will watch your condition.  Will get help right away if you are not doing well or get worse. Document Released: 02/19/2002 Document Revised: 11/22/2011 Document Reviewed: 03/19/2011 [employer] Patient Information 2015 [employer]. This information is not intended to replace advice given to you by your health care provider. Make sure you discuss any questions you have with your health care provider.  

## 2014-12-18 NOTE — Progress Notes (Signed)
Discussed neutropenic precautions and when to notify MD,  verbalized understanding. Per Geoffery Lyons NP, will hold Velcade today.

## 2014-12-24 ENCOUNTER — Other Ambulatory Visit: Payer: Self-pay | Admitting: *Deleted

## 2014-12-24 DIAGNOSIS — C8310 Mantle cell lymphoma, unspecified site: Secondary | ICD-10-CM

## 2014-12-25 ENCOUNTER — Encounter: Payer: Self-pay | Admitting: Family

## 2014-12-25 ENCOUNTER — Other Ambulatory Visit (HOSPITAL_BASED_OUTPATIENT_CLINIC_OR_DEPARTMENT_OTHER): Payer: Medicare Other

## 2014-12-25 ENCOUNTER — Ambulatory Visit (HOSPITAL_BASED_OUTPATIENT_CLINIC_OR_DEPARTMENT_OTHER): Payer: Medicare Other

## 2014-12-25 ENCOUNTER — Ambulatory Visit (HOSPITAL_BASED_OUTPATIENT_CLINIC_OR_DEPARTMENT_OTHER): Payer: Medicare Other | Admitting: Family

## 2014-12-25 VITALS — BP 112/51 | HR 81 | Temp 98.2°F | Resp 16

## 2014-12-25 VITALS — BP 118/69 | HR 90 | Temp 97.8°F | Resp 14 | Ht 63.0 in | Wt 124.0 lb

## 2014-12-25 DIAGNOSIS — Z5112 Encounter for antineoplastic immunotherapy: Secondary | ICD-10-CM | POA: Diagnosis not present

## 2014-12-25 DIAGNOSIS — Z5111 Encounter for antineoplastic chemotherapy: Secondary | ICD-10-CM

## 2014-12-25 DIAGNOSIS — C8310 Mantle cell lymphoma, unspecified site: Secondary | ICD-10-CM

## 2014-12-25 LAB — CBC WITH DIFFERENTIAL (CANCER CENTER ONLY)
BASO#: 0 10*3/uL (ref 0.0–0.2)
BASO%: 0.5 % (ref 0.0–2.0)
EOS%: 0.2 % (ref 0.0–7.0)
Eosinophils Absolute: 0 10*3/uL (ref 0.0–0.5)
HEMATOCRIT: 35.5 % (ref 34.8–46.6)
HGB: 12.1 g/dL (ref 11.6–15.9)
LYMPH#: 1 10*3/uL (ref 0.9–3.3)
LYMPH%: 15.3 % (ref 14.0–48.0)
MCH: 28.1 pg (ref 26.0–34.0)
MCHC: 34.1 g/dL (ref 32.0–36.0)
MCV: 82 fL (ref 81–101)
MONO#: 1.5 10*3/uL — ABNORMAL HIGH (ref 0.1–0.9)
MONO%: 23 % — ABNORMAL HIGH (ref 0.0–13.0)
NEUT#: 4 10*3/uL (ref 1.5–6.5)
NEUT%: 61 % (ref 39.6–80.0)
Platelets: 283 10*3/uL (ref 145–400)
RBC: 4.31 10*6/uL (ref 3.70–5.32)
RDW: 18.1 % — ABNORMAL HIGH (ref 11.1–15.7)
WBC: 6.6 10*3/uL (ref 3.9–10.0)

## 2014-12-25 LAB — CMP (CANCER CENTER ONLY)
ALBUMIN: 3.2 g/dL — AB (ref 3.3–5.5)
ALT: 20 U/L (ref 10–47)
AST: 26 U/L (ref 11–38)
Alkaline Phosphatase: 38 U/L (ref 26–84)
BUN: 12 mg/dL (ref 7–22)
CALCIUM: 9.6 mg/dL (ref 8.0–10.3)
CO2: 24 mEq/L (ref 18–33)
Chloride: 105 mEq/L (ref 98–108)
Creat: 1 mg/dl (ref 0.6–1.2)
Glucose, Bld: 94 mg/dL (ref 73–118)
Potassium: 3.6 mEq/L (ref 3.3–4.7)
Sodium: 138 mEq/L (ref 128–145)
Total Bilirubin: 0.6 mg/dl (ref 0.20–1.60)
Total Protein: 6.8 g/dL (ref 6.4–8.1)

## 2014-12-25 MED ORDER — BORTEZOMIB CHEMO SQ INJECTION 3.5 MG (2.5MG/ML)
1.3000 mg/m2 | Freq: Once | INTRAMUSCULAR | Status: AC
Start: 2014-12-25 — End: 2014-12-25
  Administered 2014-12-25: 2 mg via SUBCUTANEOUS
  Filled 2014-12-25: qty 2

## 2014-12-25 MED ORDER — SODIUM CHLORIDE 0.9 % IJ SOLN
10.0000 mL | INTRAMUSCULAR | Status: DC | PRN
  Administered 2014-12-25: 10 mL
  Filled 2014-12-25: qty 10

## 2014-12-25 MED ORDER — DIPHENHYDRAMINE HCL 25 MG PO CAPS
50.0000 mg | ORAL_CAPSULE | Freq: Once | ORAL | Status: AC
Start: 2014-12-25 — End: 2014-12-25
  Administered 2014-12-25: 50 mg via ORAL

## 2014-12-25 MED ORDER — SODIUM CHLORIDE 0.9 % IV SOLN
750.0000 mg/m2 | Freq: Once | INTRAVENOUS | Status: AC
  Administered 2014-12-25: 1200 mg via INTRAVENOUS
  Filled 2014-12-25: qty 60

## 2014-12-25 MED ORDER — SODIUM CHLORIDE 0.9 % IV SOLN
Freq: Once | INTRAVENOUS | Status: AC
  Administered 2014-12-25: 10:00:00 via INTRAVENOUS

## 2014-12-25 MED ORDER — HEPARIN SOD (PORK) LOCK FLUSH 100 UNIT/ML IV SOLN
500.0000 [IU] | Freq: Once | INTRAVENOUS | Status: AC | PRN
  Administered 2014-12-25: 500 [IU]
  Filled 2014-12-25: qty 5

## 2014-12-25 MED ORDER — DOXORUBICIN HCL CHEMO IV INJECTION 2 MG/ML
50.0000 mg/m2 | Freq: Once | INTRAVENOUS | Status: AC
Start: 2014-12-25 — End: 2014-12-25
  Administered 2014-12-25: 80 mg via INTRAVENOUS
  Filled 2014-12-25: qty 40

## 2014-12-25 MED ORDER — SODIUM CHLORIDE 0.9 % IV SOLN
375.0000 mg/m2 | Freq: Once | INTRAVENOUS | Status: AC
  Administered 2014-12-25: 600 mg via INTRAVENOUS
  Filled 2014-12-25: qty 60

## 2014-12-25 MED ORDER — SODIUM CHLORIDE 0.9 % IV SOLN
Freq: Once | INTRAVENOUS | Status: AC
  Administered 2014-12-25: 10:00:00 via INTRAVENOUS
  Filled 2014-12-25: qty 8

## 2014-12-25 MED ORDER — ACETAMINOPHEN 325 MG PO TABS
650.0000 mg | ORAL_TABLET | Freq: Once | ORAL | Status: AC
  Administered 2014-12-25: 650 mg via ORAL

## 2014-12-25 MED ORDER — ACETAMINOPHEN 325 MG PO TABS
ORAL_TABLET | ORAL | Status: AC
  Filled 2014-12-25: qty 2

## 2014-12-25 MED ORDER — DIPHENHYDRAMINE HCL 25 MG PO CAPS
ORAL_CAPSULE | ORAL | Status: AC
  Filled 2014-12-25: qty 2

## 2014-12-25 NOTE — Progress Notes (Signed)
Hematology and Oncology Follow Up Visit  Shannon Parsons [id number] March 10, 1941 74 y.o. 12/25/2014   Principle Diagnosis:  Mantle cell lymphoma (MCIPI = 5)  Current Therapy:   Patient s/p cycle 3 of VR-CAP    Interim History:  Shannon Parsons is here today for a follow-up and cycle 4 of treatement. She is doing well. She has had some fatigue and some aching in her legs and heels.  She has had no issues with infections. No fever, chills, n/v, cough, rash, dizziness, headache, SOB, mouth sores, chest pain, palpitations, abdominal pain, diarrhea, blood in urine or stool. She has had a little constipation. This was relieved using Mirilax and Dulcolax. No episodes of bleeding.  No swelling, numbness or tingling in her extremities.  Her appetite is good and she is staying hydrated. She is down 2 lbs this visit.   Medications:    Medication List       This list is accurate as of: 12/25/14  9:07 AM.  Always use your most recent med list.               acetaminophen 325 MG tablet  Commonly known as:  TYLENOL  Take 650 mg by mouth every 6 (six) hours as needed (Pain).     ALPRAZolam 0.5 MG tablet  Commonly known as:  XANAX  Take 1/2 to 1 tablet 3 x day  as needed for anxiety or sleep     famciclovir 500 MG tablet  Commonly known as:  FAMVIR  Take 1 tablet (500 mg total) by mouth daily.     fluconazole 100 MG tablet  Commonly known as:  DIFLUCAN  Take 1 tablet (100 mg total) by mouth daily.     hyoscyamine 0.125 MG tablet  Commonly known as:  LEVSIN, ANASPAZ  Take 1 to 2 tablets every 4 hours as needed for nausea, cramping, bloating or diarrhea     levofloxacin 500 MG tablet  Commonly known as:  LEVAQUIN  Take 1 tablet (500 mg total) by mouth daily.     levothyroxine 50 MCG tablet  Commonly known as:  SYNTHROID, LEVOTHROID  TAKE 1 TABLET BY MOUTH ONCE DAILY.     lidocaine-prilocaine cream  Commonly known as:  EMLA  Apply 1 application topically as needed. Apply to [facility] one  hour before access.     LORazepam 0.5 MG tablet  Commonly known as:  ATIVAN  Take 1 tablet (0.5 mg total) by mouth every 6 (six) hours as needed (Nausea or vomiting).     Magnesium 200 MG Tabs  Take 1 tablet by mouth 4 (four) times a week.     megestrol 400 MG/10ML suspension  Commonly known as:  MEGACE  Take 10 mLs (400 mg total) by mouth 2 (two) times daily.     ondansetron 8 MG tablet  Commonly known as:  ZOFRAN  Take 1 pill twice a day for 4 days. Start the day after each chemotherapy treatment.     predniSONE 20 MG tablet  Commonly known as:  DELTASONE  Take 3 tablets (60 mg total) by mouth daily. Take on days 1-5 of chemotherapy.     PROBIOTIC DAILY PO  Take 1 capsule by mouth daily.     prochlorperazine 10 MG tablet  Commonly known as:  COMPAZINE  Take 1 tablet (10 mg total) by mouth every 6 (six) hours as needed (Nausea or vomiting).     QVAR 80 MCG/ACT inhaler  Generic drug:  beclomethasone  INHALE 2  PUFFS INTO THE LUNGS 2 TIMES DAILY.     ranitidine 300 MG tablet  Commonly known as:  ZANTAC  Take 300 mg by mouth at bedtime.     Vitamin D 2000 UNITS Caps  Take 2,000-4,000 capsules by mouth every other day. Patient alternates 1 cap with 2 caps every other day.     Zinc 50 MG Tabs  Take 25 mg by mouth 4 (four) times a week.        Allergies:  Allergies  Allergen Reactions  . Actonel [Risedronate Sodium]     "made me choke"   . Fosamax [Alendronate]     "made me choke"  . Sulfa Antibiotics     Doesn't remember   . Tetracyclines & Related Rash    Past Medical History, Surgical history, Social history, and Family History were reviewed and updated.  Review of Systems: All other 10 point review of systems is negative.   Physical Exam:  vitals were not taken for this visit.  Wt Readings from Last 3 Encounters:  12/16/14 126 lb (57.153 kg)  12/04/14 125 lb (56.7 kg)  11/13/14 127 lb (57.607 kg)    Ocular: Sclerae unicteric, pupils equal, round  and reactive to light Ear-nose-throat: Oropharynx clear, dentition fair Lymphatic: No cervical or supraclavicular adenopathy Lungs no rales or rhonchi, good excursion bilaterally Heart regular rate and rhythm, no murmur appreciated Abd soft, nontender, positive bowel sounds MSK no focal spinal tenderness, no joint edema Neuro: non-focal, well-oriented, appropriate affect Breasts: Deferred  Lab Results  Component Value Date   WBC 0.9* 12/18/2014   HGB 10.7* 12/18/2014   HCT 30.7* 12/18/2014   MCV 79* 12/18/2014   PLT 112* 12/18/2014   Lab Results  Component Value Date   FERRITIN 776* 12/16/2014   IRON 82 12/16/2014   TIBC 285 12/16/2014   UIBC 203 12/16/2014   IRONPCTSAT 29 12/16/2014   Lab Results  Component Value Date   RBC 3.87 12/18/2014   No results found for: Nils Pyle [facility] Lab Results  Component Value Date   IGA 137 09/10/2014   No results found for: Odetta Pink, SPEI   Chemistry      Component Value Date/Time   NA 136 12/18/2014 1117   NA 138 09/10/2014 1459   K 3.6 12/18/2014 1117   K 4.3 09/10/2014 1459   CL 102 12/18/2014 1117   CL 102 09/10/2014 1459   CO2 22 12/18/2014 1117   CO2 27 09/10/2014 1459   BUN 12 12/18/2014 1117   BUN 13 09/10/2014 1459   CREATININE 0.9 12/18/2014 1117   CREATININE 0.90 02/27/2014 1845      Component Value Date/Time   CALCIUM 8.9 12/18/2014 1117   CALCIUM 9.4 09/10/2014 1459   ALKPHOS 30 12/18/2014 1117   ALKPHOS 83 09/10/2014 1459   AST 22 12/18/2014 1117   AST 39* 09/10/2014 1459   ALT 17 12/18/2014 1117   ALT 23 09/10/2014 1459   BILITOT 0.50 12/18/2014 1117   BILITOT 0.5 09/10/2014 1459     Impression and Plan: Shannon Parsons is 74 year old white female with mantle cell lymphoma. The MCIPI shows she is an intermediate risk. She is doing well and has had little in the way of side effects. She is experiencing some fatigue and leg aches.   We will proceed with cycle 4 of treatment today with the goal of 8 treatments. Her blood counts are good. Her ANC is 4. No lymphadenopathy on assessment.  We will get another PET scan on her in 3 weeks.  If she goes into remission there will be the possibility of transplant. If she does not she may ne a candidate for maintenance Rituxan.  We will give her a new appointment and treatment schedule today.  She knows to call here with any questions or concerns. We can certainly see her sooner if need be.   Eliezer Bottom, NP 4/13/20169:07 AM

## 2014-12-25 NOTE — Patient Instructions (Signed)
[facility] Discharge Instructions for Patients Receiving Chemotherapy  Today you received the following chemotherapy agents Rituxan, Cytoxan, Adriamycin and Velcade.  To help prevent nausea and vomiting after your treatment, we encourage you to take your nausea medication.   If you develop nausea and vomiting that is not controlled by your nausea medication, call the clinic.   BELOW ARE SYMPTOMS THAT SHOULD BE REPORTED IMMEDIATELY:  *FEVER GREATER THAN 100.5 F  *CHILLS WITH OR WITHOUT FEVER  NAUSEA AND VOMITING THAT IS NOT CONTROLLED WITH YOUR NAUSEA MEDICATION  *UNUSUAL SHORTNESS OF BREATH  *UNUSUAL BRUISING OR BLEEDING  TENDERNESS IN MOUTH AND THROAT WITH OR WITHOUT PRESENCE OF ULCERS  *URINARY PROBLEMS  *BOWEL PROBLEMS  UNUSUAL RASH Items with * indicate a potential emergency and should be followed up as soon as possible.  Feel free to call the clinic you have any questions or concerns. The clinic phone number is [phone].  Please show the [facility] at check-in to the Emergency Department and triage nurse.

## 2014-12-26 ENCOUNTER — Telehealth: Payer: Self-pay | Admitting: Hematology & Oncology

## 2014-12-26 NOTE — Telephone Encounter (Signed)
Pt concerned schedule may not be right transferred to RN

## 2015-01-01 ENCOUNTER — Other Ambulatory Visit (HOSPITAL_BASED_OUTPATIENT_CLINIC_OR_DEPARTMENT_OTHER): Payer: Medicare Other

## 2015-01-01 ENCOUNTER — Ambulatory Visit (HOSPITAL_BASED_OUTPATIENT_CLINIC_OR_DEPARTMENT_OTHER): Payer: Medicare Other

## 2015-01-01 VITALS — BP 116/69 | HR 74 | Temp 98.5°F | Resp 18

## 2015-01-01 DIAGNOSIS — C8310 Mantle cell lymphoma, unspecified site: Secondary | ICD-10-CM | POA: Diagnosis not present

## 2015-01-01 DIAGNOSIS — E860 Dehydration: Secondary | ICD-10-CM

## 2015-01-01 LAB — CMP (CANCER CENTER ONLY)
ALT: 20 U/L (ref 10–47)
AST: 25 U/L (ref 11–38)
Albumin: 3.2 g/dL — ABNORMAL LOW (ref 3.3–5.5)
Alkaline Phosphatase: 34 U/L (ref 26–84)
BUN, Bld: 16 mg/dL (ref 7–22)
CO2: 24 mEq/L (ref 18–33)
CREATININE: 1 mg/dL (ref 0.6–1.2)
Calcium: 9.3 mg/dL (ref 8.0–10.3)
Chloride: 104 mEq/L (ref 98–108)
Glucose, Bld: 82 mg/dL (ref 73–118)
POTASSIUM: 3.3 meq/L (ref 3.3–4.7)
Sodium: 136 mEq/L (ref 128–145)
Total Bilirubin: 0.8 mg/dl (ref 0.20–1.60)
Total Protein: 6.2 g/dL — ABNORMAL LOW (ref 6.4–8.1)

## 2015-01-01 LAB — CBC WITH DIFFERENTIAL (CANCER CENTER ONLY)
BASO#: 0 10*3/uL (ref 0.0–0.2)
BASO%: 0.5 % (ref 0.0–2.0)
EOS%: 0.5 % (ref 0.0–7.0)
Eosinophils Absolute: 0 10*3/uL (ref 0.0–0.5)
HCT: 30.6 % — ABNORMAL LOW (ref 34.8–46.6)
HEMOGLOBIN: 10.5 g/dL — AB (ref 11.6–15.9)
LYMPH#: 0.8 10*3/uL — ABNORMAL LOW (ref 0.9–3.3)
LYMPH%: 40.8 % (ref 14.0–48.0)
MCH: 28.5 pg (ref 26.0–34.0)
MCHC: 34.3 g/dL (ref 32.0–36.0)
MCV: 83 fL (ref 81–101)
MONO#: 0 10*3/uL — AB (ref 0.1–0.9)
MONO%: 0 % (ref 0.0–13.0)
NEUT#: 1.1 10*3/uL — ABNORMAL LOW (ref 1.5–6.5)
NEUT%: 58.2 % (ref 39.6–80.0)
Platelets: 71 10*3/uL — ABNORMAL LOW (ref 145–400)
RBC: 3.69 10*6/uL — ABNORMAL LOW (ref 3.70–5.32)
RDW: 17.1 % — AB (ref 11.1–15.7)
WBC: 1.8 10*3/uL — ABNORMAL LOW (ref 3.9–10.0)

## 2015-01-01 LAB — LACTATE DEHYDROGENASE: LDH: 191 U/L (ref 94–250)

## 2015-01-01 MED ORDER — SODIUM CHLORIDE 0.9 % IV SOLN
INTRAVENOUS | Status: DC
  Administered 2015-01-01: 10:00:00 via INTRAVENOUS

## 2015-01-01 MED ORDER — ONDANSETRON HCL 8 MG PO TABS: 8.0000 mg | ORAL_TABLET | Freq: Once | ORAL | Status: DC

## 2015-01-01 MED ORDER — ONDANSETRON HCL 8 MG PO TABS
ORAL_TABLET | ORAL | Status: AC
  Filled 2015-01-01: qty 1

## 2015-01-01 MED ORDER — BORTEZOMIB CHEMO SQ INJECTION 3.5 MG (2.5MG/ML): 1.3000 mg/m2 | Freq: Once | INTRAMUSCULAR | Status: DC

## 2015-01-01 NOTE — Progress Notes (Signed)
Per Dr. Marin Olp pt did not received treatment only IVF.

## 2015-01-01 NOTE — Patient Instructions (Signed)
Dehydration, Adult Dehydration is when you lose more fluids from the body than you take in. Vital organs like the kidneys, brain, and heart cannot function without a proper amount of fluids and salt. Any loss of fluids from the body can cause dehydration.  CAUSES   Vomiting.  Diarrhea.  Excessive sweating.  Excessive urine output.  Fever. SYMPTOMS  Mild dehydration  Thirst.  Dry lips.  Slightly dry mouth. Moderate dehydration  Very dry mouth.  Sunken eyes.  Skin does not bounce back quickly when lightly pinched and released.  Dark urine and decreased urine production.  Decreased tear production.  Headache. Severe dehydration  Very dry mouth.  Extreme thirst.  Rapid, weak pulse (more than 100 beats per minute at rest).  Cold hands and feet.  Not able to sweat in spite of heat and temperature.  Rapid breathing.  Blue lips.  Confusion and lethargy.  Difficulty being awakened.  Minimal urine production.  No tears. DIAGNOSIS  Your caregiver will diagnose dehydration based on your symptoms and your exam. Blood and urine tests will help confirm the diagnosis. The diagnostic evaluation should also identify the cause of dehydration. TREATMENT  Treatment of mild or moderate dehydration can often be done at home by increasing the amount of fluids that you drink. It is best to drink small amounts of fluid more often. Drinking too much at one time can make vomiting worse. Refer to the home care instructions below. Severe dehydration needs to be treated at the hospital where you will probably be given intravenous (IV) fluids that contain water and electrolytes. HOME CARE INSTRUCTIONS   Ask your caregiver about specific rehydration instructions.  Drink enough fluids to keep your urine clear or pale yellow.  Drink small amounts frequently if you have nausea and vomiting.  Eat as you normally do.  Avoid:  Foods or drinks high in sugar.  Carbonated  drinks.  Juice.  Extremely hot or cold fluids.  Drinks with caffeine.  Fatty, greasy foods.  Alcohol.  Tobacco.  Overeating.  Gelatin desserts.  Wash your hands well to avoid spreading bacteria and viruses.  Only take over-the-counter or prescription medicines for pain, discomfort, or fever as directed by your caregiver.  Ask your caregiver if you should continue all prescribed and over-the-counter medicines.  Keep all follow-up appointments with your caregiver. SEEK MEDICAL CARE IF:  You have abdominal pain and it increases or stays in one area (localizes).  You have a rash, stiff neck, or severe headache.  You are irritable, sleepy, or difficult to awaken.  You are weak, dizzy, or extremely thirsty. SEEK IMMEDIATE MEDICAL CARE IF:   You are unable to keep fluids down or you get worse despite treatment.  You have frequent episodes of vomiting or diarrhea.  You have blood or green matter (bile) in your vomit.  You have blood in your stool or your stool looks black and tarry.  You have not urinated in 6 to 8 hours, or you have only urinated a small amount of very dark urine.  You have a fever.  You faint. MAKE SURE YOU:   Understand these instructions.  Will watch your condition.  Will get help right away if you are not doing well or get worse. Document Released: 08/30/2005 Document Revised: 11/22/2011 Document Reviewed: 04/19/2011 Exit[employer] Patient Information 2015 [employer]. This information is not intended to replace advice given to you by your health care provider. Make sure you discuss any questions you have with your health care   provider.  

## 2015-01-02 ENCOUNTER — Other Ambulatory Visit: Payer: Self-pay | Admitting: *Deleted

## 2015-01-02 ENCOUNTER — Telehealth: Payer: Self-pay | Admitting: *Deleted

## 2015-01-02 ENCOUNTER — Ambulatory Visit: Payer: Medicare Other | Admitting: Nutrition

## 2015-01-02 MED ORDER — SUCRALFATE 1 GM/10ML PO SUSP: 1.0000 g | Freq: Three times a day (TID) | ORAL | Status: DC

## 2015-01-02 NOTE — Progress Notes (Signed)
Patient contacted me and left me a message to return phone call regarding taste alterations. Contacted patient by phone who reports she has a strong metallic taste from her chemotherapy. Patient states her appetite is pretty good and she is trying to eat. She does have a history of weight loss. Educated patient on strategies for improving taste alterations. Encouraged small frequent meals and snacks. Questions were answered and teach back method used. Will mail patient information along with my contact information for further questions.  **Disclaimer: This note was dictated with voice recognition software. Similar sounding words can inadvertently be transcribed and this note may contain transcription errors which may not have been corrected upon publication of note.**

## 2015-01-02 NOTE — Telephone Encounter (Signed)
Patient called stating that she awful indigestion not relieved by zantac or tums.  Dr. Marin Olp ordered Carafate. Rx sent to pharmacy.

## 2015-01-04 ENCOUNTER — Emergency Department (HOSPITAL_COMMUNITY): Payer: Medicare Other

## 2015-01-04 ENCOUNTER — Inpatient Hospital Stay (HOSPITAL_COMMUNITY)
Admission: EM | Admit: 2015-01-04 | Discharge: 2015-01-10 | DRG: 602 | Disposition: A | Discharge status: Home / Self Care | Payer: Medicare Other | Attending: Internal Medicine | Admitting: Internal Medicine

## 2015-01-04 ENCOUNTER — Encounter (HOSPITAL_COMMUNITY): Payer: Self-pay | Admitting: Emergency Medicine

## 2015-01-04 DIAGNOSIS — K589 Irritable bowel syndrome without diarrhea: Secondary | ICD-10-CM | POA: Diagnosis present

## 2015-01-04 DIAGNOSIS — D638 Anemia in other chronic diseases classified elsewhere: Secondary | ICD-10-CM | POA: Diagnosis present

## 2015-01-04 DIAGNOSIS — J45909 Unspecified asthma, uncomplicated: Secondary | ICD-10-CM | POA: Diagnosis present

## 2015-01-04 DIAGNOSIS — H00036 Abscess of eyelid left eye, unspecified eyelid: Secondary | ICD-10-CM

## 2015-01-04 DIAGNOSIS — T451X5A Adverse effect of antineoplastic and immunosuppressive drugs, initial encounter: Secondary | ICD-10-CM | POA: Diagnosis present

## 2015-01-04 DIAGNOSIS — G4733 Obstructive sleep apnea (adult) (pediatric): Secondary | ICD-10-CM | POA: Diagnosis present

## 2015-01-04 DIAGNOSIS — N6019 Diffuse cystic mastopathy of unspecified breast: Secondary | ICD-10-CM | POA: Diagnosis present

## 2015-01-04 DIAGNOSIS — I82411 Acute embolism and thrombosis of right femoral vein: Secondary | ICD-10-CM | POA: Diagnosis present

## 2015-01-04 DIAGNOSIS — J449 Chronic obstructive pulmonary disease, unspecified: Secondary | ICD-10-CM | POA: Diagnosis not present

## 2015-01-04 DIAGNOSIS — D701 Agranulocytosis secondary to cancer chemotherapy: Secondary | ICD-10-CM | POA: Diagnosis not present

## 2015-01-04 DIAGNOSIS — H05012 Cellulitis of left orbit: Secondary | ICD-10-CM | POA: Diagnosis present

## 2015-01-04 DIAGNOSIS — I10 Essential (primary) hypertension: Secondary | ICD-10-CM | POA: Diagnosis present

## 2015-01-04 DIAGNOSIS — Z8572 Personal history of non-Hodgkin lymphomas: Secondary | ICD-10-CM

## 2015-01-04 DIAGNOSIS — C8310 Mantle cell lymphoma, unspecified site: Secondary | ICD-10-CM | POA: Diagnosis present

## 2015-01-04 DIAGNOSIS — D61810 Antineoplastic chemotherapy induced pancytopenia: Secondary | ICD-10-CM | POA: Diagnosis present

## 2015-01-04 DIAGNOSIS — E039 Hypothyroidism, unspecified: Secondary | ICD-10-CM | POA: Diagnosis present

## 2015-01-04 DIAGNOSIS — M810 Age-related osteoporosis without current pathological fracture: Secondary | ICD-10-CM | POA: Diagnosis present

## 2015-01-04 DIAGNOSIS — E876 Hypokalemia: Secondary | ICD-10-CM | POA: Diagnosis present

## 2015-01-04 DIAGNOSIS — R5081 Fever presenting with conditions classified elsewhere: Secondary | ICD-10-CM | POA: Diagnosis present

## 2015-01-04 DIAGNOSIS — Z86010 Personal history of colonic polyps: Secondary | ICD-10-CM | POA: Diagnosis not present

## 2015-01-04 DIAGNOSIS — M7989 Other specified soft tissue disorders: Secondary | ICD-10-CM | POA: Diagnosis not present

## 2015-01-04 DIAGNOSIS — I82441 Acute embolism and thrombosis of right tibial vein: Secondary | ICD-10-CM | POA: Diagnosis present

## 2015-01-04 DIAGNOSIS — Z888 Allergy status to other drugs, medicaments and biological substances status: Secondary | ICD-10-CM

## 2015-01-04 DIAGNOSIS — G250 Essential tremor: Secondary | ICD-10-CM | POA: Diagnosis present

## 2015-01-04 DIAGNOSIS — E785 Hyperlipidemia, unspecified: Secondary | ICD-10-CM | POA: Diagnosis present

## 2015-01-04 DIAGNOSIS — L03818 Cellulitis of other sites: Secondary | ICD-10-CM | POA: Diagnosis not present

## 2015-01-04 DIAGNOSIS — Z9049 Acquired absence of other specified parts of digestive tract: Secondary | ICD-10-CM | POA: Diagnosis not present

## 2015-01-04 DIAGNOSIS — I82431 Acute embolism and thrombosis of right popliteal vein: Secondary | ICD-10-CM | POA: Diagnosis present

## 2015-01-04 DIAGNOSIS — D649 Anemia, unspecified: Secondary | ICD-10-CM | POA: Diagnosis not present

## 2015-01-04 DIAGNOSIS — D709 Neutropenia, unspecified: Secondary | ICD-10-CM

## 2015-01-04 DIAGNOSIS — E440 Moderate protein-calorie malnutrition: Secondary | ICD-10-CM | POA: Diagnosis present

## 2015-01-04 DIAGNOSIS — Z882 Allergy status to sulfonamides status: Secondary | ICD-10-CM

## 2015-01-04 DIAGNOSIS — D696 Thrombocytopenia, unspecified: Secondary | ICD-10-CM | POA: Diagnosis not present

## 2015-01-04 DIAGNOSIS — L03211 Cellulitis of face: Secondary | ICD-10-CM | POA: Diagnosis not present

## 2015-01-04 DIAGNOSIS — Z86718 Personal history of other venous thrombosis and embolism: Secondary | ICD-10-CM | POA: Diagnosis present

## 2015-01-04 DIAGNOSIS — Z6821 Body mass index (BMI) 21.0-21.9, adult: Secondary | ICD-10-CM | POA: Diagnosis not present

## 2015-01-04 DIAGNOSIS — J4489 Other specified chronic obstructive pulmonary disease: Secondary | ICD-10-CM | POA: Diagnosis present

## 2015-01-04 DIAGNOSIS — M1990 Unspecified osteoarthritis, unspecified site: Secondary | ICD-10-CM | POA: Diagnosis present

## 2015-01-04 DIAGNOSIS — K219 Gastro-esophageal reflux disease without esophagitis: Secondary | ICD-10-CM | POA: Diagnosis present

## 2015-01-04 DIAGNOSIS — I82401 Acute embolism and thrombosis of unspecified deep veins of right lower extremity: Secondary | ICD-10-CM | POA: Diagnosis not present

## 2015-01-04 DIAGNOSIS — C8590 Non-Hodgkin lymphoma, unspecified, unspecified site: Secondary | ICD-10-CM

## 2015-01-04 DIAGNOSIS — L03213 Periorbital cellulitis: Secondary | ICD-10-CM

## 2015-01-04 LAB — CBC WITH DIFFERENTIAL/PLATELET
Basophils Absolute: 0 10*3/uL (ref 0.0–0.1)
Basophils Relative: 1 % (ref 0–1)
EOS PCT: 0 % (ref 0–5)
Eosinophils Absolute: 0 10*3/uL (ref 0.0–0.7)
HCT: 27.2 % — ABNORMAL LOW (ref 36.0–46.0)
Hemoglobin: 9.2 g/dL — ABNORMAL LOW (ref 12.0–15.0)
LYMPHS PCT: 91 % — AB (ref 12–46)
Lymphs Abs: 0.2 10*3/uL — ABNORMAL LOW (ref 0.7–4.0)
MCH: 27.6 pg (ref 26.0–34.0)
MCHC: 33.8 g/dL (ref 30.0–36.0)
MCV: 81.7 fL (ref 78.0–100.0)
MONOS PCT: 0 % — AB (ref 3–12)
Monocytes Absolute: 0 10*3/uL — ABNORMAL LOW (ref 0.1–1.0)
NEUTROS ABS: 0 10*3/uL — AB (ref 1.7–7.7)
NEUTROS PCT: 8 % — AB (ref 43–77)
Platelets: 48 10*3/uL — ABNORMAL LOW (ref 150–400)
RBC: 3.33 MIL/uL — ABNORMAL LOW (ref 3.87–5.11)
RDW: 16.8 % — AB (ref 11.5–15.5)
WBC: 0.2 10*3/uL — CL (ref 4.0–10.5)

## 2015-01-04 LAB — LACTIC ACID, PLASMA: Lactic Acid, Venous: 0.6 mmol/L (ref 0.5–2.0)

## 2015-01-04 LAB — COMPREHENSIVE METABOLIC PANEL
ALBUMIN: 3.2 g/dL — AB (ref 3.5–5.2)
ALT: 13 U/L (ref 0–35)
AST: 22 U/L (ref 0–37)
Alkaline Phosphatase: 31 U/L — ABNORMAL LOW (ref 39–117)
Anion gap: 6 (ref 5–15)
BILIRUBIN TOTAL: 0.7 mg/dL (ref 0.3–1.2)
BUN: 12 mg/dL (ref 6–23)
CHLORIDE: 106 mmol/L (ref 96–112)
CO2: 22 mmol/L (ref 19–32)
CREATININE: 0.75 mg/dL (ref 0.50–1.10)
Calcium: 8.6 mg/dL (ref 8.4–10.5)
GFR calc Af Amer: >90 mL/min (ref >90)
GFR calc non Af Amer: 83 mL/min — ABNORMAL LOW (ref >90)
Glucose, Bld: 89 mg/dL (ref 70–99)
Potassium: 3.7 mmol/L (ref 3.5–5.1)
Sodium: 134 mmol/L — ABNORMAL LOW (ref 135–145)
Total Protein: 5.6 g/dL — ABNORMAL LOW (ref 6.0–8.3)

## 2015-01-04 LAB — URINALYSIS, ROUTINE W REFLEX MICROSCOPIC
BILIRUBIN URINE: NEGATIVE
Glucose, UA: NEGATIVE mg/dL
Hgb urine dipstick: NEGATIVE
Ketones, ur: NEGATIVE mg/dL
LEUKOCYTES UA: NEGATIVE
NITRITE: NEGATIVE
Protein, ur: NEGATIVE mg/dL
Specific Gravity, Urine: 1.01 (ref 1.005–1.030)
Urobilinogen, UA: 0.2 mg/dL (ref 0.0–1.0)
pH: 7.5 (ref 5.0–8.0)

## 2015-01-04 LAB — I-STAT CG4 LACTIC ACID, ED: LACTIC ACID, VENOUS: 1.11 mmol/L (ref 0.5–2.0)

## 2015-01-04 MED ORDER — SUCRALFATE 1 GM/10ML PO SUSP
1.0000 g | Freq: Three times a day (TID) | ORAL | Status: DC
  Administered 2015-01-04 – 2015-01-10 (×21): 1 g via ORAL
  Filled 2015-01-04 (×26): qty 10

## 2015-01-04 MED ORDER — SODIUM CHLORIDE 0.9 % IV SOLN
3.0000 g | Freq: Four times a day (QID) | INTRAVENOUS | Status: DC
  Administered 2015-01-04 – 2015-01-10 (×23): 3 g via INTRAVENOUS
  Filled 2015-01-04 (×25): qty 3

## 2015-01-04 MED ORDER — TBO-FILGRASTIM 300 MCG/0.5ML ~~LOC~~ SOSY
300.0000 ug | PREFILLED_SYRINGE | SUBCUTANEOUS | Status: DC
  Administered 2015-01-04 – 2015-01-07 (×4): 300 ug via SUBCUTANEOUS
  Filled 2015-01-04 (×9): qty 0.5

## 2015-01-04 MED ORDER — DOCUSATE SODIUM 100 MG PO CAPS
100.0000 mg | ORAL_CAPSULE | Freq: Two times a day (BID) | ORAL | Status: DC
  Administered 2015-01-04 – 2015-01-10 (×12): 100 mg via ORAL
  Filled 2015-01-04 (×14): qty 1

## 2015-01-04 MED ORDER — ONDANSETRON HCL 4 MG PO TABS: 4.0000 mg | ORAL_TABLET | Freq: Four times a day (QID) | ORAL | Status: DC | PRN

## 2015-01-04 MED ORDER — VANCOMYCIN HCL 500 MG IV SOLR
500.0000 mg | Freq: Two times a day (BID) | INTRAVENOUS | Status: DC
  Administered 2015-01-04 – 2015-01-05 (×3): 500 mg via INTRAVENOUS
  Filled 2015-01-04 (×5): qty 500

## 2015-01-04 MED ORDER — ACETAMINOPHEN 650 MG RE SUPP: 650.0000 mg | Freq: Four times a day (QID) | RECTAL | Status: DC | PRN

## 2015-01-04 MED ORDER — MEGESTROL ACETATE 400 MG/10ML PO SUSP
400.0000 mg | Freq: Two times a day (BID) | ORAL | Status: DC
  Administered 2015-01-04 – 2015-01-08 (×9): 400 mg via ORAL
  Filled 2015-01-04 (×10): qty 10

## 2015-01-04 MED ORDER — FLUCONAZOLE 100 MG PO TABS
100.0000 mg | ORAL_TABLET | Freq: Every day | ORAL | Status: DC
Start: 2015-01-04 — End: 2015-01-10
  Administered 2015-01-04 – 2015-01-09 (×6): 100 mg via ORAL
  Filled 2015-01-04 (×10): qty 1

## 2015-01-04 MED ORDER — ALPRAZOLAM 0.5 MG PO TABS
0.5000 mg | ORAL_TABLET | Freq: Three times a day (TID) | ORAL | Status: DC | PRN
  Administered 2015-01-04 – 2015-01-07 (×4): 0.5 mg via ORAL
  Filled 2015-01-04 (×4): qty 1

## 2015-01-04 MED ORDER — ACETAMINOPHEN 500 MG PO TABS
1000.0000 mg | ORAL_TABLET | Freq: Once | ORAL | Status: AC
  Administered 2015-01-04: 1000 mg via ORAL
  Filled 2015-01-04: qty 2

## 2015-01-04 MED ORDER — BUDESONIDE 0.25 MG/2ML IN SUSP
0.2500 mg | Freq: Two times a day (BID) | RESPIRATORY_TRACT | Status: DC
Start: 2015-01-04 — End: 2015-01-10
  Administered 2015-01-04 – 2015-01-09 (×10): 0.25 mg via RESPIRATORY_TRACT
  Filled 2015-01-04 (×12): qty 2

## 2015-01-04 MED ORDER — MORPHINE SULFATE 2 MG/ML IJ SOLN
0.5000 mg | INTRAMUSCULAR | Status: DC | PRN
  Administered 2015-01-04 (×2): 0.5 mg via INTRAVENOUS
  Filled 2015-01-04 (×2): qty 1

## 2015-01-04 MED ORDER — IOHEXOL 300 MG/ML  SOLN
100.0000 mL | Freq: Once | INTRAMUSCULAR | Status: AC | PRN
  Administered 2015-01-04: 100 mL via INTRAVENOUS

## 2015-01-04 MED ORDER — BECLOMETHASONE DIPROPIONATE 80 MCG/ACT IN AERS: 1.0000 | INHALATION_SPRAY | Freq: Two times a day (BID) | RESPIRATORY_TRACT | Status: DC

## 2015-01-04 MED ORDER — ACETAMINOPHEN 325 MG PO TABS
650.0000 mg | ORAL_TABLET | Freq: Four times a day (QID) | ORAL | Status: DC | PRN
  Administered 2015-01-04 – 2015-01-09 (×4): 650 mg via ORAL
  Filled 2015-01-04 (×4): qty 2

## 2015-01-04 MED ORDER — SODIUM CHLORIDE 0.9 % IV SOLN
INTRAVENOUS | Status: AC
  Administered 2015-01-04: 18:00:00 via INTRAVENOUS

## 2015-01-04 MED ORDER — CETYLPYRIDINIUM CHLORIDE 0.05 % MT LIQD
7.0000 mL | Freq: Two times a day (BID) | OROMUCOSAL | Status: DC
  Administered 2015-01-04 – 2015-01-07 (×5): 7 mL via OROMUCOSAL

## 2015-01-04 MED ORDER — FAMOTIDINE 20 MG PO TABS
20.0000 mg | ORAL_TABLET | Freq: Every day | ORAL | Status: DC
  Administered 2015-01-04 – 2015-01-09 (×6): 20 mg via ORAL
  Filled 2015-01-04 (×6): qty 1

## 2015-01-04 MED ORDER — SODIUM CHLORIDE 0.9 % IV SOLN
3.0000 g | Freq: Once | INTRAVENOUS | Status: AC
  Administered 2015-01-04: 3 g via INTRAVENOUS
  Filled 2015-01-04: qty 3

## 2015-01-04 MED ORDER — OXYCODONE HCL 5 MG PO TABS
5.0000 mg | ORAL_TABLET | ORAL | Status: DC | PRN
  Administered 2015-01-04: 5 mg via ORAL
  Filled 2015-01-04: qty 1

## 2015-01-04 MED ORDER — VANCOMYCIN HCL IN DEXTROSE 1-5 GM/200ML-% IV SOLN
1000.0000 mg | Freq: Once | INTRAVENOUS | Status: AC
  Administered 2015-01-04: 1000 mg via INTRAVENOUS
  Filled 2015-01-04: qty 200

## 2015-01-04 MED ORDER — VALACYCLOVIR HCL 500 MG PO TABS
1000.0000 mg | ORAL_TABLET | Freq: Every day | ORAL | Status: DC
  Administered 2015-01-04 – 2015-01-10 (×7): 1000 mg via ORAL
  Filled 2015-01-04 (×7): qty 2

## 2015-01-04 MED ORDER — SODIUM CHLORIDE 0.9 % IJ SOLN
10.0000 mL | INTRAMUSCULAR | Status: DC | PRN
  Administered 2015-01-04 – 2015-01-10 (×7): 10 mL
  Filled 2015-01-04 (×6): qty 40

## 2015-01-04 MED ORDER — IOHEXOL 350 MG/ML SOLN: 100.0000 mL | Freq: Once | INTRAVENOUS | Status: DC | PRN

## 2015-01-04 MED ORDER — SODIUM CHLORIDE 0.9 % IV BOLUS (SEPSIS)
1000.0000 mL | Freq: Once | INTRAVENOUS | Status: AC
  Administered 2015-01-04: 1000 mL via INTRAVENOUS

## 2015-01-04 MED ORDER — LEVOTHYROXINE SODIUM 50 MCG PO TABS
50.0000 ug | ORAL_TABLET | Freq: Every day | ORAL | Status: DC
  Administered 2015-01-06 – 2015-01-10 (×5): 50 ug via ORAL
  Filled 2015-01-04 (×6): qty 1

## 2015-01-04 MED ORDER — ENSURE ENLIVE PO LIQD
237.0000 mL | Freq: Two times a day (BID) | ORAL | Status: DC
  Administered 2015-01-04 – 2015-01-09 (×7): 237 mL via ORAL

## 2015-01-04 MED ORDER — ONDANSETRON HCL 4 MG/2ML IJ SOLN: 4.0000 mg | Freq: Four times a day (QID) | INTRAMUSCULAR | Status: DC | PRN

## 2015-01-04 NOTE — ED Notes (Signed)
Pt states that she has lymphoma.  States that she had to skip her last chemo d/t low white count.  Pt states that she began having lt eye pain and swelling yesterday.  Pt tachycardic in triage at 125 and states home BP of 89/54.  Now 126/66.

## 2015-01-04 NOTE — ED Notes (Signed)
Patient transported to CT 

## 2015-01-04 NOTE — Progress Notes (Signed)
ANTIBIOTIC CONSULT NOTE - INITIAL  Pharmacy Consult for vancomycin, ampicillin/sulbactam Indication: periorbital/preseptal cellulitis  Allergies  Allergen Reactions  . Actonel [Risedronate Sodium]     "made me choke"   . Fosamax [Alendronate]     "made me choke"  . Sulfa Antibiotics     Doesn't remember   . Tetracyclines & Related Rash    Patient Measurements:   Adjusted Body Weight:   Vital Signs: Temp: 97.7 F (36.5 C) (04/23 1352) Temp Source: Oral (04/23 1352) BP: 118/66 mmHg (04/23 1352) Pulse Rate: 80 (04/23 1352) Intake/Output from previous day:   Intake/Output from this shift:    Labs:  Recent Labs  01/04/15 1019  WBC 0.2*  HGB 9.2*  PLT 48*  CREATININE 0.75   Estimated Creatinine Clearance: 53.4 mL/min (by C-G formula based on Cr of 0.75). No results for input(s): VANCOTROUGH, VANCOPEAK, VANCORANDOM, GENTTROUGH, GENTPEAK, GENTRANDOM, TOBRATROUGH, TOBRAPEAK, TOBRARND, AMIKACINPEAK, AMIKACINTROU, AMIKACIN in the last 72 hours.   Microbiology: No results found for this or any previous visit (from the past 720 hour(s)).  Medical History: Past Medical History  Diagnosis Date  . OSA (obstructive sleep apnea)   . Hiatal hernia   . IBS (irritable bowel syndrome)   . Fibrocystic breast disease   . Asthma   . Diverticulosis   . Adenomatous colon polyp 1994  . Internal hemorrhoids   . Melanoma     Facial  . Hyperlipidemia   . Hypertension   . GERD (gastroesophageal reflux disease)   . Osteoporosis   . Osteoarthritis   . Vitamin D deficiency   . Prediabetes   . Unspecified hypothyroidism   . Family history of ischemic heart disease   . Mantle cell lymphoma 10/18/2014   Assessment: 22 YOF presents with eye pain/swelling. She has h/o lymphoma with chemo 4/13.  She is noted to have periorbital/preseptal cellulitis of L eye. Pharmacy asked to dose vancomycin and unasyn.  First doses given in ED. She is neutropenic at admission w/ undetectable ANCw / Plan  to start G-CSF.   4/23 >> vancomycin >> 4/23 >> Unasyn >>  Normalized CrCl = 2ml/min  Goal of Therapy:  Vancomycin trough level 10-15 mcg/ml  Plan:   Vancomcyin 500mg  IV q12h  Check steady state trough if vancomycin to continue > 72h or change in renal function/clinical status  Unasyn 3gm IV q6h  Doreene Eland, PharmD, BCPS.   Pager: [phone]  01/04/2015,3:56 PM

## 2015-01-04 NOTE — ED Notes (Signed)
Pt was aware she needs a urine sample. Pt states she will notify when she is able to void

## 2015-01-04 NOTE — ED Provider Notes (Signed)
CSN: [id number]     Arrival date & time 01/04/15  0932 History   First MD Initiated Contact with Patient 01/04/15 [phone]     Chief Complaint  Patient presents with  . Facial Swelling     (Consider location/radiation/quality/duration/timing/severity/associated sxs/prior Treatment) HPI The patient reports that her eye started to swell with some redness the evening before her presentation to the emergency department. She reports it was very tender and the redness and swelling advanced fairly quickly to the stated as in right now. There has been no problems with her vision. She can move the eye from side to side without limitation. She reports there is generalized facial pain right around the area of the swelling. She denies any matting, drainage or discharge from the eye this morning. She has not had any fever. Patient does not use contacts. She denies any injury or trauma to the area. The patient has had significant neutropenia secondary to chemotherapy. She reports that her last chemotherapy had to be postponed due to profound neutropenia. She does not have any other associated complaints. Past Medical History  Diagnosis Date  . OSA (obstructive sleep apnea)   . Hiatal hernia   . IBS (irritable bowel syndrome)   . Fibrocystic breast disease   . Asthma   . Diverticulosis   . Adenomatous colon polyp 1994  . Internal hemorrhoids   . Melanoma     Facial  . Hyperlipidemia   . Hypertension   . GERD (gastroesophageal reflux disease)   . Osteoporosis   . Osteoarthritis   . Vitamin D deficiency   . Prediabetes   . Unspecified hypothyroidism   . Family history of ischemic heart disease   . Mantle cell lymphoma 10/18/2014   Past Surgical History  Procedure Laterality Date  . Appendectomy    . Tonsillectomy    . Bunionectomy    . Knee arthroscopy    . Umbilical hernia repair     Family History  Problem Relation Age of Onset  . Emphysema Father   . Heart disease Father   . Heart attack  Father 80    Died  . Heart disease Brother   . Rheum arthritis Sister   . Hypertension Mother   . Thyroid disease Mother   . Colon cancer Neg Hx    History  Substance Use Topics  . Smoking status: Never Smoker   . Smokeless tobacco: Never Used     Comment: Never Used Tobacco  . Alcohol Use: No   OB History    No data available     Review of Systems 10 Systems reviewed and are negative for acute change except as noted in the HPI.    Allergies  Actonel; Fosamax; Sulfa antibiotics; and Tetracyclines & related  Home Medications   Prior to Admission medications   Medication Sig Start Date End Date Taking? Authorizing Provider  acetaminophen (TYLENOL) 325 MG tablet Take 650 mg by mouth every 6 (six) hours as needed (Pain).   Yes Historical Provider, MD  ALPRAZolam Duanne Moron) 0.5 MG tablet Take 1/2 to 1 tablet 3 x day  as needed for anxiety or sleep Patient taking differently: Take 0.5 mg by mouth 3 (three) times daily as needed for anxiety or sleep.  07/15/14 01/13/15 Yes Unk Pinto, MD  Cholecalciferol (VITAMIN D) 2000 UNITS CAPS Take 2,000-4,000 capsules by mouth every other day. Patient alternates 1 cap with 2 caps every other day.   Yes Historical Provider, MD  famciclovir (FAMVIR) 500 MG tablet  Take 1 tablet (500 mg total) by mouth daily. 10/18/14  Yes Volanda Napoleon, MD  fluconazole (DIFLUCAN) 100 MG tablet Take 1 tablet (100 mg total) by mouth daily. 11/06/14  Yes Volanda Napoleon, MD  hyoscyamine (LEVSIN, ANASPAZ) 0.125 MG tablet Take 1 to 2 tablets every 4 hours as needed for nausea, cramping, bloating or diarrhea 08/19/14  Yes Unk Pinto, MD  levofloxacin (LEVAQUIN) 500 MG tablet Take 1 tablet (500 mg total) by mouth daily. 11/06/14  Yes Volanda Napoleon, MD  levothyroxine (SYNTHROID, LEVOTHROID) 50 MCG tablet TAKE 1 TABLET BY MOUTH ONCE DAILY. Patient taking differently: 25 mg daily 10/31/14  Yes Unk Pinto, MD  lidocaine-prilocaine (EMLA) cream Apply 1 application  topically as needed. Apply to [facility] one hour before access. 10/17/14  Yes Volanda Napoleon, MD  Magnesium 200 MG TABS Take 1 tablet by mouth every morning.    Yes Historical Provider, MD  megestrol (MEGACE) 400 MG/10ML suspension Take 10 mLs (400 mg total) by mouth 2 (two) times daily. 11/14/14  Yes Volanda Napoleon, MD  ondansetron (ZOFRAN) 8 MG tablet Take 1 pill twice a day for 4 days. Start the day after each chemotherapy treatment. 10/18/14  Yes Volanda Napoleon, MD  predniSONE (DELTASONE) 20 MG tablet Take 3 tablets (60 mg total) by mouth daily. Take on days 1-5 of chemotherapy. 10/23/14  Yes Volanda Napoleon, MD  [place]   Yes Historical Provider, MD  Probiotic Product (PROBIOTIC DAILY PO) Take 1 capsule by mouth daily.   Yes Historical Provider, MD  QVAR 80 MCG/ACT inhaler INHALE 2 PUFFS INTO THE LUNGS 2 TIMES DAILY. Patient taking differently: INHALE 1 PUFFS INTO THE LUNGS 2 TIMES DAILY. 07/08/14  Yes Kathee Delton, MD  ranitidine (ZANTAC) 300 MG tablet Take 300 mg by mouth at bedtime.   Yes Unk Pinto, MD  sucralfate (CARAFATE) 1 GM/10ML suspension Take 10 mLs (1 g total) by mouth 4 (four) times daily -  with meals and at bedtime. 01/02/15  Yes Volanda Napoleon, MD  LORazepam (ATIVAN) 0.5 MG tablet Take 1 tablet (0.5 mg total) by mouth every 6 (six) hours as needed (Nausea or vomiting). Patient not taking: Reported on 01/04/2015 10/23/14   Volanda Napoleon, MD   BP 111/61 mmHg  Pulse 79  Temp(Src) 99.1 F (37.3 C) (Oral)  Resp 18  Ht 5\' 3"  (1.6 m)  Wt 124 lb (56.246 kg)  BMI 21.97 kg/m2  SpO2 99%  LMP 09/14/2014 Physical Exam  Constitutional: She is oriented to person, place, and time. She appears well-developed and well-nourished.  The patient is nontoxic and alert in appearance. She does not have any respiratory distress.  HENT:  Head: Normocephalic and atraumatic.  Right Ear: External ear normal.  Left Ear: External ear normal.  Nose: Nose normal.    Mouth/Throat: Oropharynx is clear and moist. No oropharyngeal exudate.  Eyes: EOM are normal. Pupils are equal, round, and reactive to light.  Patient has diffuse erythema surrounding the left eye as illustrated in photo below. There is a focal, nodular area at the lid margin consistent with a developing hordeolum. There is no drainage or discharge from this and it is not fluctuant at this time.  Neck: Neck supple.  Cardiovascular: Normal rate, regular rhythm, normal heart sounds and intact distal pulses.   Pulmonary/Chest: Effort normal and breath sounds normal.  Abdominal: Soft. She exhibits no distension.  Musculoskeletal: Normal range of motion.  Neurological: She is alert  and oriented to person, place, and time. No cranial nerve deficit. Coordination normal.  Skin: Skin is warm and dry.  Psychiatric: She has a normal mood and affect.         ED Course  Procedures (including critical care time) Labs Review Labs Reviewed  COMPREHENSIVE METABOLIC PANEL - Abnormal; Notable for the following:    Sodium 134 (*)    Total Protein 5.6 (*)    Albumin 3.2 (*)    Alkaline Phosphatase 31 (*)    GFR calc non Af Amer 83 (*)    All other components within normal limits  CBC WITH DIFFERENTIAL/PLATELET - Abnormal; Notable for the following:    WBC 0.2 (*)    RBC 3.33 (*)    Hemoglobin 9.2 (*)    HCT 27.2 (*)    RDW 16.8 (*)    Platelets 48 (*)    Neutrophils Relative % 8 (*)    Lymphocytes Relative 91 (*)    Monocytes Relative 0 (*)    Neutro Abs 0.0 (*)    Lymphs Abs 0.2 (*)    Monocytes Absolute 0.0 (*)    All other components within normal limits  CBC WITH DIFFERENTIAL/PLATELET - Abnormal; Notable for the following:    WBC 0.3 (*)    RBC 3.09 (*)    Hemoglobin 8.7 (*)    HCT 25.3 (*)    RDW 16.9 (*)    Platelets 44 (*)    Neutrophils Relative % 0 (*)    Lymphocytes Relative 89 (*)    Basophils Relative 4 (*)    Neutro Abs 0.0 (*)    Lymphs Abs 0.3 (*)    Monocytes  Absolute 0.0 (*)    All other components within normal limits  COMPREHENSIVE METABOLIC PANEL - Abnormal; Notable for the following:    Sodium 132 (*)    Glucose, Bld 106 (*)    Calcium 8.1 (*)    Total Protein 5.3 (*)    Albumin 2.9 (*)    Alkaline Phosphatase 27 (*)    GFR calc non Af Amer 83 (*)    Anion gap 4 (*)    All other components within normal limits  CULTURE, BLOOD (ROUTINE X 2)  CULTURE, BLOOD (ROUTINE X 2)  URINALYSIS, ROUTINE W REFLEX MICROSCOPIC  LACTIC ACID, PLASMA  PATHOLOGIST SMEAR REVIEW  HEPARIN LEVEL (UNFRACTIONATED)  I-STAT CG4 LACTIC ACID, ED    Imaging Review Ct Orbits W/cm  01/04/2015   CLINICAL DATA:  Lymphoma. Facial swelling. Left eye pain and swelling. Symptoms for 1 day.  EXAM: CT ORBITS WITH CONTRAST  TECHNIQUE: Multidetector CT imaging of the orbits was performed following the bolus administration of intravenous contrast.  CONTRAST:  166mL OMNIPAQUE IOHEXOL 300 MG/ML  SOLN  COMPARISON:  09/16/2014  FINDINGS: The left globe is within normal limits. There is no evidence of abscess in the left orbit. The right orbit is unremarkable. Mucous retention cyst in the left maxillary sinus. Visualized paranasal sinuses are otherwise clear. There is fluid in the left mastoid air cells. No destructive bone lesion. No fracture.  IMPRESSION: No evidence of orbital abscess.  There is fluid in the left mastoid air cells. An inflammatory process is not excluded.   Electronically Signed   By: Marybelle Killings M.D.   On: 01/04/2015 12:11     EKG Interpretation None     Consult: The patient's case was discussed with Dr.Feng, at this time with the patient's profound neutropenia and findings of preseptal cellulitis,  the patient be treated with broad-spectrum antibiotics and admitted to the medical service. MDM   Final diagnoses:  Preseptal cellulitis of left eye  Neutropenia  Lymphoma   The patient is neutropenic but not febrile. She does however have a concerning she  infection surrounding her left eye. CT scan has ruled out orbital cellulitis at this time. Her mental status is clear and she is nontoxic. She will be started on vancomycin and Unasyn in the emergency department with a planned admission for ongoing observation of advancement of infection and IV therapy management.    Charlesetta Shanks, MD 01/05/15 [phone]

## 2015-01-04 NOTE — H&P (Signed)
[facility] History and Physical  Shannon Parsons:[id number] DOB: 09-29-1940 DOA: 01/04/2015   PCP: Shannon Richards, MD  Specialists: Dr. Marin Parsons is her oncologist  Chief Complaint: Pain in the left eye  HPI: Shannon Parsons is a 74 y.o. female with a past medical history of mantle cell lymphoma on chemotherapy, the last chemotherapy was on April 13 (R-CHOP), history of hypothyroidism, history of asthma who was in her usual state of health yesterday when she started noticing pain in the left eye. It started swelling. She also noted redness all over her eyelid. Pain is 8-10 out of 10 in intensity. Pain would increase with movement of the eyes. It was achy and sharp kind of pain. It would come and go. No urinary or relieving factors. She took Tylenol at home without any relief. She was also finding it difficult to open her eyes. Denies any discharge from the eye. No visual disturbances. No fever. No change in her vision. She's never had this before. Denies any injuries to her eyes. Does not have an ophthalmologist. Denies any nausea, vomiting. No headaches. Her chemotherapy on the 20th was canceled due to low blood counts. She doesn't recall getting any Neulasta. She also mentioned that recently she was having burning sensation in her lower chest and upper abdomen and was experiencing some indigestion. She was prescribed Carafate with which her symptoms have improved.  Home Medications: Prior to Admission medications   Medication Sig Start Date End Date Taking? Authorizing Provider  acetaminophen (TYLENOL) 325 MG tablet Take 650 mg by mouth every 6 (six) hours as needed (Pain).   Yes Historical Provider, MD  ALPRAZolam Shannon Parsons) 0.5 MG tablet Take 1/2 to 1 tablet 3 x day  as needed for anxiety or sleep Patient taking differently: Take 0.5 mg by mouth 3 (three) times daily as needed for anxiety or sleep.  07/15/14 01/13/15 Yes Shannon Pinto, MD  Cholecalciferol (VITAMIN D) 2000 UNITS  CAPS Take 2,000-4,000 capsules by mouth every other day. Patient alternates 1 cap with 2 caps every other day.   Yes Historical Provider, MD  famciclovir (FAMVIR) 500 MG tablet Take 1 tablet (500 mg total) by mouth daily. 10/18/14  Yes Shannon Napoleon, MD  fluconazole (DIFLUCAN) 100 MG tablet Take 1 tablet (100 mg total) by mouth daily. 11/06/14  Yes Shannon Napoleon, MD  hyoscyamine (LEVSIN, ANASPAZ) 0.125 MG tablet Take 1 to 2 tablets every 4 hours as needed for nausea, cramping, bloating or diarrhea 08/19/14  Yes Shannon Pinto, MD  levofloxacin (LEVAQUIN) 500 MG tablet Take 1 tablet (500 mg total) by mouth daily. 11/06/14  Yes Shannon Napoleon, MD  levothyroxine (SYNTHROID, LEVOTHROID) 50 MCG tablet TAKE 1 TABLET BY MOUTH ONCE DAILY. Patient taking differently: 25 mg daily 10/31/14  Yes Shannon Pinto, MD  lidocaine-prilocaine (EMLA) cream Apply 1 application topically as needed. Apply to [facility] one hour before access. 10/17/14  Yes Shannon Napoleon, MD  Magnesium 200 MG TABS Take 1 tablet by mouth every morning.    Yes Historical Provider, MD  megestrol (MEGACE) 400 MG/10ML suspension Take 10 mLs (400 mg total) by mouth 2 (two) times daily. 11/14/14  Yes Shannon Napoleon, MD  ondansetron (ZOFRAN) 8 MG tablet Take 1 pill twice a day for 4 days. Start the day after each chemotherapy treatment. 10/18/14  Yes Shannon Napoleon, MD  predniSONE (DELTASONE) 20 MG tablet Take 3 tablets (60 mg total) by mouth daily. Take on days 1-5 of chemotherapy. 10/23/14  Yes Shannon Napoleon, MD  [place]   Yes Historical Provider, MD  Probiotic Product (PROBIOTIC DAILY PO) Take 1 capsule by mouth daily.   Yes Historical Provider, MD  QVAR 80 MCG/ACT inhaler INHALE 2 PUFFS INTO THE LUNGS 2 TIMES DAILY. Patient taking differently: INHALE 1 PUFFS INTO THE LUNGS 2 TIMES DAILY. 07/08/14  Yes Shannon Delton, MD  ranitidine (ZANTAC) 300 MG tablet Take 300 mg by mouth at bedtime.   Yes Shannon Pinto, MD  sucralfate  (CARAFATE) 1 GM/10ML suspension Take 10 mLs (1 g total) by mouth 4 (four) times daily -  with meals and at bedtime. 01/02/15  Yes Shannon Napoleon, MD  LORazepam (ATIVAN) 0.5 MG tablet Take 1 tablet (0.5 mg total) by mouth every 6 (six) hours as needed (Nausea or vomiting). Patient not taking: Reported on 01/04/2015 10/23/14   Shannon Napoleon, MD    Allergies:  Allergies  Allergen Reactions  . Actonel [Risedronate Sodium]     "made me choke"   . Fosamax [Alendronate]     "made me choke"  . Sulfa Antibiotics     Doesn't remember   . Tetracyclines & Related Rash    Past Medical History: Past Medical History  Diagnosis Date  . OSA (obstructive sleep apnea)   . Hiatal hernia   . IBS (irritable bowel syndrome)   . Fibrocystic breast disease   . Asthma   . Diverticulosis   . Adenomatous colon polyp 1994  . Internal hemorrhoids   . Melanoma     Facial  . Hyperlipidemia   . Hypertension   . GERD (gastroesophageal reflux disease)   . Osteoporosis   . Osteoarthritis   . Vitamin D deficiency   . Prediabetes   . Unspecified hypothyroidism   . Family history of ischemic heart disease   . Mantle cell lymphoma 10/18/2014    Past Surgical History  Procedure Laterality Date  . Appendectomy    . Tonsillectomy    . Bunionectomy    . Knee arthroscopy    . Umbilical hernia repair      Social History: Lives in [place] with her husband. No smoking, alcohol use or illicit drug use. She is usually independent with daily activities, but has been unable to do much since she was started on chemotherapy earlier this year.  Family History:  Family History  Problem Relation Age of Onset  . Emphysema Father   . Heart disease Father   . Heart attack Father 64    Died  . Heart disease Brother   . Rheum arthritis Sister   . Hypertension Mother   . Thyroid disease Mother   . Colon cancer Neg Hx      Review of Systems - History obtained from the patient General ROS: positive for  -  fatigue Psychological ROS: negative Ophthalmic ROS: as in hpi ENT ROS: negative Allergy and Immunology ROS: negative Hematological and Lymphatic ROS: as in hpi Endocrine ROS: negative Respiratory ROS: no cough, shortness of breath, or wheezing Cardiovascular ROS: no chest pain or dyspnea on exertion Gastrointestinal ROS: no abdominal pain, change in bowel habits, or black or bloody stools Genito-Urinary ROS: no dysuria, trouble voiding, or hematuria Musculoskeletal ROS: negative Neurological ROS: no TIA or stroke symptoms Dermatological ROS: negative  Physical Examination  Filed Vitals:   01/04/15 0903 01/04/15 1147 01/04/15 1352  BP: 126/66 121/65 118/66  Pulse: 114 75 80  Temp: 97.8 F (36.6 C) 98.9 F (37.2 C) 97.7  F (36.5 C)  TempSrc: Oral Oral Oral  Resp: 18 18 18   SpO2: 100% 97% 100%    BP 118/66 mmHg  Pulse 80  Temp(Src) 97.7 F (36.5 C) (Oral)  Resp 18  SpO2 100%  LMP 09/14/2014  General appearance: alert, cooperative, appears stated age, no distress and slightly tremulous Head: Normocephalic, without obvious abnormality, atraumatic Eyes: Left eye and surrounding area is slightly swollen. Erythematous. No active drainage. Tender to palpation. Warm to touch. Throat: lips, mucosa, and tongue normal; teeth and gums normal Neck: no adenopathy, no carotid bruit, no JVD, supple, symmetrical, trachea midline and thyroid not enlarged, symmetric, no tenderness/mass/nodules Resp: clear to auscultation bilaterally Cardio: regular rate and rhythm, S1, S2 normal, no murmur, click, rub or gallop GI: soft, non-tender; bowel sounds normal; no masses,  no organomegaly Extremities: Right lower extremity appears to be slightly larger compared to left. Good pulses. Pulses: 2+ and symmetric Skin: Erythematous Left eyelid Lymph nodes: Cervical, supraclavicular, and axillary nodes normal. Neurologic: Alert and oriented 3. No focal neurological deficits are noted.  Laboratory  Data: Results for orders placed or performed during the hospital encounter of 01/04/15 (from the past 48 hour(s))  Comprehensive metabolic panel     Status: Abnormal   Collection Time: 01/04/15 10:19 AM  Result Value Ref Range   Sodium 134 (L) 135 - 145 mmol/L   Potassium 3.7 3.5 - 5.1 mmol/L   Chloride 106 96 - 112 mmol/L   CO2 22 19 - 32 mmol/L   Glucose, Bld 89 70 - 99 mg/dL   BUN 12 6 - 23 mg/dL   Creatinine, Ser 0.75 0.50 - 1.10 mg/dL   Calcium 8.6 8.4 - 10.5 mg/dL   Total Protein 5.6 (L) 6.0 - 8.3 g/dL   Albumin 3.2 (L) 3.5 - 5.2 g/dL   AST 22 0 - 37 U/L   ALT 13 0 - 35 U/L   Alkaline Phosphatase 31 (L) 39 - 117 U/L   Total Bilirubin 0.7 0.3 - 1.2 mg/dL   GFR calc non Af Amer 83 (L) >90 mL/min   GFR calc Af Amer >90 >90 mL/min    Comment: (NOTE) The eGFR has been calculated using the CKD EPI equation. This calculation has not been validated in all clinical situations. eGFR's persistently <90 mL/min signify possible Chronic Kidney Disease.    Anion gap 6 5 - 15  CBC with Differential     Status: Abnormal   Collection Time: 01/04/15 10:19 AM  Result Value Ref Range   WBC 0.2 (LL) 4.0 - 10.5 K/uL    Comment: RESULT REPEATED AND VERIFIED CRITICAL RESULT CALLED TO, READ BACK BY AND VERIFIED WITH: HENDERSON,C AT 1045 ON [id number] BY HOOKER,B    RBC 3.33 (L) 3.87 - 5.11 MIL/uL   Hemoglobin 9.2 (L) 12.0 - 15.0 g/dL   HCT 27.2 (L) 36.0 - 46.0 %   MCV 81.7 78.0 - 100.0 fL   MCH 27.6 26.0 - 34.0 pg   MCHC 33.8 30.0 - 36.0 g/dL   RDW 16.8 (H) 11.5 - 15.5 %   Platelets 48 (L) 150 - 400 K/uL    Comment: RESULT REPEATED AND VERIFIED SPECIMEN CHECKED FOR CLOTS PLATELET COUNT CONFIRMED BY SMEAR    Neutrophils Relative % 8 (L) 43 - 77 %   Lymphocytes Relative 91 (H) 12 - 46 %   Monocytes Relative 0 (L) 3 - 12 %   Eosinophils Relative 0 0 - 5 %   Basophils Relative 1 0 - 1 %  Neutro Abs 0.0 (L) 1.7 - 7.7 K/uL   Lymphs Abs 0.2 (L) 0.7 - 4.0 K/uL   Monocytes Absolute 0.0 (L) 0.1  - 1.0 K/uL   Eosinophils Absolute 0.0 0.0 - 0.7 K/uL   Basophils Absolute 0.0 0.0 - 0.1 K/uL   Smear Review MORPHOLOGY UNREMARKABLE   I-Stat CG4 Lactic Acid, ED     Status: None   Collection Time: 01/04/15 10:31 AM  Result Value Ref Range   Lactic Acid, Venous 1.11 0.5 - 2.0 mmol/L  Urinalysis, Routine w reflex microscopic     Status: None   Collection Time: 01/04/15 11:34 AM  Result Value Ref Range   Color, Urine YELLOW YELLOW   APPearance CLEAR CLEAR   Specific Gravity, Urine 1.010 1.005 - 1.030   pH 7.5 5.0 - 8.0   Glucose, UA NEGATIVE NEGATIVE mg/dL   Hgb urine dipstick NEGATIVE NEGATIVE   Bilirubin Urine NEGATIVE NEGATIVE   Ketones, ur NEGATIVE NEGATIVE mg/dL   Protein, ur NEGATIVE NEGATIVE mg/dL   Urobilinogen, UA 0.2 0.0 - 1.0 mg/dL   Nitrite NEGATIVE NEGATIVE   Leukocytes, UA NEGATIVE NEGATIVE    Comment: MICROSCOPIC NOT DONE ON URINES WITH NEGATIVE PROTEIN, BLOOD, LEUKOCYTES, NITRITE, OR GLUCOSE <1000 mg/dL.    Radiology Reports: Ct Orbits W/cm  01/04/2015   CLINICAL DATA:  Lymphoma. Facial swelling. Left eye pain and swelling. Symptoms for 1 day.  EXAM: CT ORBITS WITH CONTRAST  TECHNIQUE: Multidetector CT imaging of the orbits was performed following the bolus administration of intravenous contrast.  CONTRAST:  160m OMNIPAQUE IOHEXOL 300 MG/ML  SOLN  COMPARISON:  09/16/2014  FINDINGS: The left globe is within normal limits. There is no evidence of abscess in the left orbit. The right orbit is unremarkable. Mucous retention cyst in the left maxillary sinus. Visualized paranasal sinuses are otherwise clear. There is fluid in the left mastoid air cells. No destructive bone lesion. No fracture.  IMPRESSION: No evidence of orbital abscess.  There is fluid in the left mastoid air cells. An inflammatory process is not excluded.   Electronically Signed   By: AMarybelle KillingsM.D.   On: 01/04/2015 12:11     Problem List  Principal Problem:   Preseptal cellulitis of left eye Active  Problems:   Chronic obstructive airway disease with asthma   Essential tremor   Mantle cell lymphoma   Chemotherapy induced neutropenia   Thrombocytopenia   Normocytic anemia   Assessment: This is a 74year old Caucasian female with a past medical history of mantle cell lymphoma on chemotherapy, who presents with left eye pain and swelling. She appears to have cellulitis of the left eyelid and the surrounding area. CT scan does not show any evidence for orbital involvement. She also was noted to be pancytopenic especially neutropenic, which is most likely chemotherapy induced.  Plan: #1 Periorbital/preseptal cellulitis of the left eye: We will give her broad-spectrum coverage with vancomycin and Unasyn. Blood cultures will be followed up on. There is no orbital involvement noted on CT scan. Her vision is intact. Continue to monitor closely.  #2 Pancytopenia, especially chemotherapy induced neutropenia: This is most likely secondary to chemotherapy. Discussed with the oncologist on call, Dr. FBurr Medico Patient does not get Neulasta after her chemotherapy. Oncologist recommends giving 300 g of Neupogen. This will be ordered. Counts will be monitored closely. Neutropenic precautions will be utilized. Monitor platelet counts closely. There is no evidence for active bleeding currently. Monitor hemoglobin closely as well.  #3 unequal leg size: Her right  leg appears to be slightly larger compared to left. We'll get venous Dopplers.  #4 mantle cell lymphoma on chemotherapy: Last chemotherapy was on April 13. Chemotherapy was canceled on April 20 due to neutropenia. Her oncologist (Dr. Marin Parsons) will be flagged on Epic.  #5 history of hypothyroidism: Continue with levothyroxine.  #6 history of asthma/COPD: Continue with her inhalers.   DVT Prophylaxis: Teds stockings for now. Once, venous Dopplers have been done and if there is no DVT, SCDs can be utilized. Code Status: Full code Family Communication:  Discussed with the patient and her husband  Disposition Plan: Admit to MedSurg   Further management decisions will depend on results of further testing and patient's response to treatment.   [facility]  [facility] Pager [phone]  If 7PM-7AM, please contact night-coverage [email or web address] Password [email or web address]  01/04/2015, 3:21 PM

## 2015-01-04 NOTE — ED Notes (Signed)
MD at bedside. 

## 2015-01-05 ENCOUNTER — Other Ambulatory Visit: Payer: Self-pay | Admitting: Hematology

## 2015-01-05 DIAGNOSIS — C8310 Mantle cell lymphoma, unspecified site: Secondary | ICD-10-CM

## 2015-01-05 DIAGNOSIS — I82411 Acute embolism and thrombosis of right femoral vein: Secondary | ICD-10-CM

## 2015-01-05 DIAGNOSIS — M7989 Other specified soft tissue disorders: Secondary | ICD-10-CM

## 2015-01-05 DIAGNOSIS — H05012 Cellulitis of left orbit: Secondary | ICD-10-CM

## 2015-01-05 DIAGNOSIS — D61810 Antineoplastic chemotherapy induced pancytopenia: Secondary | ICD-10-CM

## 2015-01-05 LAB — CBC WITH DIFFERENTIAL/PLATELET
BASOS ABS: 0 10*3/uL (ref 0.0–0.1)
Basophils Relative: 4 % — ABNORMAL HIGH (ref 0–1)
EOS PCT: 0 % (ref 0–5)
Eosinophils Absolute: 0 10*3/uL (ref 0.0–0.7)
HCT: 25.3 % — ABNORMAL LOW (ref 36.0–46.0)
Hemoglobin: 8.7 g/dL — ABNORMAL LOW (ref 12.0–15.0)
LYMPHS ABS: 0.3 10*3/uL — AB (ref 0.7–4.0)
LYMPHS PCT: 89 % — AB (ref 12–46)
MCH: 28.2 pg (ref 26.0–34.0)
MCHC: 34.4 g/dL (ref 30.0–36.0)
MCV: 81.9 fL (ref 78.0–100.0)
MONO ABS: 0 10*3/uL — AB (ref 0.1–1.0)
MONOS PCT: 7 % (ref 3–12)
Neutro Abs: 0 10*3/uL — ABNORMAL LOW (ref 1.7–7.7)
Neutrophils Relative %: 0 % — ABNORMAL LOW (ref 43–77)
PLATELETS: 44 10*3/uL — AB (ref 150–400)
RBC: 3.09 MIL/uL — ABNORMAL LOW (ref 3.87–5.11)
RDW: 16.9 % — ABNORMAL HIGH (ref 11.5–15.5)
WBC: 0.3 10*3/uL — AB (ref 4.0–10.5)

## 2015-01-05 LAB — COMPREHENSIVE METABOLIC PANEL
ALK PHOS: 27 U/L — AB (ref 39–117)
ALT: 13 U/L (ref 0–35)
AST: 17 U/L (ref 0–37)
Albumin: 2.9 g/dL — ABNORMAL LOW (ref 3.5–5.2)
Anion gap: 4 — ABNORMAL LOW (ref 5–15)
BUN: 13 mg/dL (ref 6–23)
CO2: 21 mmol/L (ref 19–32)
CREATININE: 0.77 mg/dL (ref 0.50–1.10)
Calcium: 8.1 mg/dL — ABNORMAL LOW (ref 8.4–10.5)
Chloride: 107 mmol/L (ref 96–112)
GFR calc Af Amer: >90 mL/min (ref >90)
GFR calc non Af Amer: 83 mL/min — ABNORMAL LOW (ref >90)
GLUCOSE: 106 mg/dL — AB (ref 70–99)
POTASSIUM: 3.7 mmol/L (ref 3.5–5.1)
Sodium: 132 mmol/L — ABNORMAL LOW (ref 135–145)
Total Bilirubin: 0.7 mg/dL (ref 0.3–1.2)
Total Protein: 5.3 g/dL — ABNORMAL LOW (ref 6.0–8.3)

## 2015-01-05 LAB — HEPARIN LEVEL (UNFRACTIONATED): HEPARIN UNFRACTIONATED: 0.23 [IU]/mL — AB (ref 0.30–0.70)

## 2015-01-05 MED ORDER — HEPARIN (PORCINE) IN NACL 100-0.45 UNIT/ML-% IJ SOLN
1000.0000 [IU]/h | INTRAMUSCULAR | Status: DC
  Administered 2015-01-05: 900 [IU]/h via INTRAVENOUS
  Administered 2015-01-06 – 2015-01-08 (×3): 1000 [IU]/h via INTRAVENOUS
  Filled 2015-01-05 (×5): qty 250

## 2015-01-05 NOTE — Evaluation (Signed)
Physical Therapy Evaluation Patient Details Name: Shannon Parsons MRN: [id number] DOB: [birth year] Today's Date: 01/05/2015   History of Present Illness  Shannon Parsons is a 74 y.o. female with a past medical history of mantle cell lymphoma on chemotherapy,  history of hypothyroidism, history of asthma who was in her usual state of health until 01/04/15  yesterday when she started noticing pain in the left eye. noted to have cellulitis. Also found to have R LE DVT,  Clinical Impression  Patient ambulated x 200' with 1 person assist. Now found to have R LE DVT. Patient will benefit from PT to address problems listed in note below.    Follow Up Recommendations Home health PT;Supervision/Assistance - 24 hour    Equipment Recommendations  Rolling walker with 5" wheels (may not need)    Recommendations for Other Services       Precautions / Restrictions Precautions Precautions: Fall Precaution Comments: R DVT, dx after PT ambulated., neutropenic      Mobility  Bed Mobility Overal bed mobility: Independent                Transfers Overall transfer level: Needs assistance Equipment used: 1 person hand held assist Transfers: Sit to/from Stand Sit to Stand: Min guard            Ambulation/Gait Ambulation/Gait assistance: Min assist Ambulation Distance (Feet): 200 Feet Assistive device: 1 person hand held assist Gait Pattern/deviations: Drifts right/left Gait velocity: slow   General Gait Details: patient  improved with  balance. less drifting.  Stairs            Wheelchair Mobility    Modified Rankin (Stroke Patients Only)       Balance Overall balance assessment: Needs assistance Sitting-balance support: Feet supported;No upper extremity supported Sitting balance-Leahy Scale: Good     Standing balance support: During functional activity;No upper extremity supported Standing balance-Leahy Scale: Fair                                Pertinent Vitals/Pain Pain Assessment: 0-10 Pain Score: 4  Pain Location: L face Pain Descriptors / Indicators: Discomfort;Tender    Home Living Family/patient expects to be discharged to:: Private residence Living Arrangements: Spouse/significant other Available Help at Discharge: Family Type of Home: House Home Access: Stairs to enter Entrance Stairs-Rails: Right Entrance Stairs-Number of Steps: 2 Home Layout: Two level;Able to live on main level with bedroom/bathroom Home Equipment: None      Prior Function Level of Independence: Independent               Hand Dominance        Extremity/Trunk Assessment   Upper Extremity Assessment:  (noted tremors.)           Lower Extremity Assessment: Generalized weakness (noted tremors)      Cervical / Trunk Assessment: Normal  Communication   Communication: No difficulties  Cognition Arousal/Alertness: Awake/alert Behavior During Therapy: WFL for tasks assessed/performed Overall Cognitive Status: Within Functional Limits for tasks assessed                      General Comments      Exercises        Assessment/Plan    PT Assessment Patient needs continued PT services  PT Diagnosis Difficulty walking;Generalized weakness   PT Problem List Decreased strength;Decreased balance;Decreased mobility;Decreased knowledge of precautions;Decreased activity tolerance;Decreased safety awareness;Decreased knowledge of use of  DME  PT Treatment Interventions DME instruction;Gait training;Functional mobility training;Therapeutic activities;Therapeutic exercise;Patient/family education   PT Goals (Current goals can be found in the Care Plan section) Acute Rehab PT Goals Patient Stated Goal: to walk more, go home PT Goal Formulation: With patient Time For Goal Achievement: 01/19/15 Potential to Achieve Goals: Good    Frequency Min 3X/week   Barriers to discharge        Co-evaluation                End of Session Equipment Utilized During Treatment: Gait belt Activity Tolerance: Patient tolerated treatment well Patient left: in chair;with call bell/phone within reach Nurse Communication: Mobility status         Time: 8250-0370 PT Time Calculation (min) (ACUTE ONLY): 21 min   Charges:   PT Evaluation $Initial PT Evaluation Tier I: 1 Procedure     PT G CodesClaretha Cooper 01/05/2015, 11:12 AM [name] PT [phone]

## 2015-01-05 NOTE — Progress Notes (Signed)
Called to clarify TED hose order . per Dr Maryland Pink - patient should wear knee high ted hose . NO SCDs.

## 2015-01-05 NOTE — Progress Notes (Signed)
Pharmacy: Re- Heparin  Patient's a 73 y.o F on heparin for new DVT.  First heparin level now back slightly below goal at 0.23 (goal 0.3-0.7).  No bleeding documented.  Plan: - Increase drip to 1000 units/hr - recheck another 8 hour heparin level to assess new rate  Dia Sitter, PharmD, BCPS 01/05/2015 8:43 PM

## 2015-01-05 NOTE — Plan of Care (Signed)
Problem: Consults Goal: General Medical Patient Education See Patient Education Module for specific education.  Outcome: Progressing No Manual BPs related to Low Platelets High Risk Bleeding Precautions

## 2015-01-05 NOTE — Progress Notes (Signed)
Shannon Parsons   DOB:May 06, 1941   NF#:[id number]   QIO#:[id number]  Subjective: Shannon Parsons my partner Dr. Antonieta Pert pt,   and cover him today. Patient was admitted for left orbital cellulitis , and was found to have severe neutropenia with ANC 0, thrombocytopenia and new onset right femoral DVT. She denies any significant of vision change, no headaches, few supple same as yesterday.    Objective:  Filed Vitals:   01/05/15 1341  BP: 156/64  Pulse: 95  Temp: 99.2 F (37.3 C)  Resp: 24    Body mass index is 21.97 kg/(m^2).  Intake/Output Summary (Last 24 hours) at 01/05/15 1716 Last data filed at 01/05/15 1315  Gross per 24 hour  Intake 3357.5 ml  Output   1900 ml  Net 1457.5 ml     Sclerae unicteric, (+) skin erythema and redness in left orbital area   Oropharynx clear  No peripheral adenopathy  Lungs clear -- no rales or rhonchi  Heart regular rate and rhythm  Abdomen benign  MSK no focal spinal tenderness, no peripheral edema  Neuro nonfocal   CBG (last 3)  No results for input(s): GLUCAP in the last 72 hours.   Labs:  Lab Results  Component Value Date   WBC 0.3* 01/05/2015   HGB 8.7* 01/05/2015   HCT 25.3* 01/05/2015   MCV 81.9 01/05/2015   PLT 44* 01/05/2015   NEUTROABS 0.0* 01/05/2015    @LASTCHEMISTRY @  Urine Studies No results for input(s): UHGB, CRYS in the last 72 hours.  Invalid input(s): UACOL, UAPR, USPG, UPH, UTP, UGL, UKET, UBIL, UNIT, UROB, ULEU, UEPI, UWBC, URBC, UBAC, CAST, UCOM, [place]  Basic Metabolic Panel:  Recent Labs Lab 01/01/15 0935 01/04/15 1019 01/05/15 0346  NA 136 134* 132*  K 3.3 3.7 3.7  CL 104 106 107  CO2 24 22 21   GLUCOSE 82 89 106*  BUN 16 12 13   CREATININE 1.0 0.75 0.77  CALCIUM 9.3 8.6 8.1*   GFR Estimated Creatinine Clearance: 53.4 mL/min (by C-G formula based on Cr of 0.77). Liver Function Tests:  Recent Labs Lab 01/01/15 0935 01/04/15 1019 01/05/15 0346  AST 25 22 17   ALT 20 13 13   ALKPHOS 34 31* 27*   BILITOT 0.80 0.7 0.7  PROT 6.2* 5.6* 5.3*  ALBUMIN  --  3.2* 2.9*   No results for input(s): LIPASE, AMYLASE in the last 168 hours. No results for input(s): AMMONIA in the last 168 hours. Coagulation profile No results for input(s): INR, PROTIME in the last 168 hours.  CBC:  Recent Labs Lab 01/01/15 0905 01/04/15 1019 01/05/15 0346  WBC 1.8* 0.2* 0.3*  NEUTROABS 1.1* 0.0* 0.0*  HGB 10.5* 9.2* 8.7*  HCT 30.6* 27.2* 25.3*  MCV 83 81.7 81.9  PLT 71* 48* 44*   Cardiac Enzymes: No results for input(s): CKTOTAL, CKMB, CKMBINDEX, TROPONINI in the last 168 hours. BNP: Invalid input(s): POCBNP CBG: No results for input(s): GLUCAP in the last 168 hours. D-Dimer No results for input(s): DDIMER in the last 72 hours. Hgb A1c No results for input(s): HGBA1C in the last 72 hours. Lipid Profile No results for input(s): CHOL, HDL, LDLCALC, TRIG, CHOLHDL, LDLDIRECT in the last 72 hours. Thyroid function studies No results for input(s): TSH, T4TOTAL, T3FREE, THYROIDAB in the last 72 hours.  Invalid input(s): FREET3 Anemia work up No results for input(s): VITAMINB12, FOLATE, FERRITIN, TIBC, IRON, RETICCTPCT in the last 72 hours. Microbiology Recent Results (from the past 240 hour(s))  Culture, blood (routine x 2)  Status: None (Preliminary result)   Collection Time: 01/04/15 10:19 AM  Result Value Ref Range Status   Specimen Description BLOOD PORTA CATH  Final   Special Requests BOTTLES DRAWN AEROBIC AND ANAEROBIC 5 CC EACH  Final   Culture   Final           BLOOD CULTURE RECEIVED NO GROWTH TO DATE CULTURE WILL BE HELD FOR 5 DAYS BEFORE ISSUING A FINAL NEGATIVE REPORT Performed at [facility]    Report Status PENDING  Incomplete  Culture, blood (routine x 2)     Status: None (Preliminary result)   Collection Time: 01/04/15 10:35 AM  Result Value Ref Range Status   Specimen Description BLOOD LEFT ANTECUBITAL  Final   Special Requests BOTTLES DRAWN AEROBIC AND  ANAEROBIC 5 CC EACH  Final   Culture   Final           BLOOD CULTURE RECEIVED NO GROWTH TO DATE CULTURE WILL BE HELD FOR 5 DAYS BEFORE ISSUING A FINAL NEGATIVE REPORT Performed at [facility]    Report Status PENDING  Incomplete      Studies:  Ct Orbits W/cm  01/04/2015   CLINICAL DATA:  Lymphoma. Facial swelling. Left eye pain and swelling. Symptoms for 1 day.  EXAM: CT ORBITS WITH CONTRAST  TECHNIQUE: Multidetector CT imaging of the orbits was performed following the bolus administration of intravenous contrast.  CONTRAST:  169mL OMNIPAQUE IOHEXOL 300 MG/ML  SOLN  COMPARISON:  09/16/2014  FINDINGS: The left globe is within normal limits. There is no evidence of abscess in the left orbit. The right orbit is unremarkable. Mucous retention cyst in the left maxillary sinus. Visualized paranasal sinuses are otherwise clear. There is fluid in the left mastoid air cells. No destructive bone lesion. No fracture.  IMPRESSION: No evidence of orbital abscess.  There is fluid in the left mastoid air cells. An inflammatory process is not excluded.   Electronically Signed   By: Marybelle Killings M.D.   On: 01/04/2015 12:11    Assessment: 74 y.o. with mantle cell lymphoma, status post 3 cycles RV-CAP, last cycle on 12/25/14.   1. Left orbital cellulitis  2. Pancytopenia, ANC 0, platelet count 44K today, secondary to chemotherapy, no clinical signs of bleeding  3. Newly diagnosed extensive right femoral DVT  4. Mantle cell lymphoma, on chemotherapy    Plan:  -Agree with broad antibiotics for their latest  -I spoke with hospitalist Dr. Maryland Pink yesterday and today. We started her on Neupogen last night, continue until [place] above 1000  -I discussed the risk of benefit of anticoagulation for her DVT . She is at moderate risk of bleeding due to the chemotherapy-related thrombus cytopenia. Alternative strategy of IVC filter placement were discussed with patient. Giving her some cytopenia is likely related  to chemotherapy, possible component of consumption from DVT, he will likely recover in the next few days. So we recommended and started her on heparin drip, or watch APTT and bleeding signs closely.  -Follow-up CBC daily.  -Dr. Marin Olp will follow-up on her starting from tomorrow.     Truitt Merle, MD 01/05/2015  5:16 PM\

## 2015-01-05 NOTE — Progress Notes (Signed)
[facility] PROGRESS NOTE  Shannon Parsons GYF:[id number] DOB: [birth year] DOA: 01/04/2015  PCP: Alesia Richards, MD  Brief HPI: 74 year old Caucasian female with a past medical history of mantle cell lymphoma on chemotherapy was last chemotherapy on April 13 presented with pain in the left eye. She had evidence for left eyelid cellulitis along with surrounding area. There is no evidence for orbital cellulitis on CT scan. She was admitted to the hospital with broad-spectrum antibiotic coverage. She was also neutropenic and was started on Neupogen. She was noted to have enlargement of her right leg and is noted to have an acute DVT.  Past medical history:  Past Medical History  Diagnosis Date  . OSA (obstructive sleep apnea)   . Hiatal hernia   . IBS (irritable bowel syndrome)   . Fibrocystic breast disease   . Asthma   . Diverticulosis   . Adenomatous colon polyp 1994  . Internal hemorrhoids   . Melanoma     Facial  . Hyperlipidemia   . Hypertension   . GERD (gastroesophageal reflux disease)   . Osteoporosis   . Osteoarthritis   . Vitamin D deficiency   . Prediabetes   . Unspecified hypothyroidism   . Family history of ischemic heart disease   . Mantle cell lymphoma 10/18/2014    Consultants: Oncologist  Procedures:  Lower extremity venous Dopplers Extensive right lower extremity DVT  Antibiotics: Vancomycin and Unasyn 4/23  Subjective: Patient feels better this morning. Pain is about the same as yesterday in her left eye area. Denies any visual disturbances. No chest pain or shortness of breath.  Objective: Vital Signs  Filed Vitals:   01/04/15 2235 01/04/15 2310 01/05/15 0449 01/05/15 0836  BP:   111/61   Pulse:   79   Temp:  98.9 F (37.2 C) 99.1 F (37.3 C)   TempSrc:   Oral   Resp:   18   Height:      Weight:      SpO2: 96%  100% 99%    Intake/Output Summary (Last 24 hours) at 01/05/15 1002 Last data filed at 01/05/15 0800  Gross per  24 hour  Intake 3117.5 ml  Output   1350 ml  Net 1767.5 ml   Filed Weights   01/04/15 1625  Weight: 56.246 kg (124 lb)    General appearance: alert, cooperative, appears stated age and no distress Left eye remains somewhat swollen. Erythema present. Warm to touch. Tender to palpation. Perhaps slightly less swollen than yesterday. Neck: no adenopathy, no carotid bruit, no JVD, supple, symmetrical, trachea midline and thyroid not enlarged, symmetric, no tenderness/mass/nodules Resp: clear to auscultation bilaterally Cardio: regular rate and rhythm, S1, S2 normal, no murmur, click, rub or gallop GI: soft, non-tender; bowel sounds normal; no masses,  no organomegaly Extremities: Right leg larger than left Pulses: 2+ and symmetric Neurologic: No focal deficits  Lab Results:  Basic Metabolic Panel:  Recent Labs Lab 01/01/15 0935 01/04/15 1019 01/05/15 0346  NA 136 134* 132*  K 3.3 3.7 3.7  CL 104 106 107  CO2 24 22 21   GLUCOSE 82 89 106*  BUN 16 12 13   CREATININE 1.0 0.75 0.77  CALCIUM 9.3 8.6 8.1*   Liver Function Tests:  Recent Labs Lab 01/01/15 0935 01/04/15 1019 01/05/15 0346  AST 25 22 17   ALT 20 13 13   ALKPHOS 34 31* 27*  BILITOT 0.80 0.7 0.7  PROT 6.2* 5.6* 5.3*  ALBUMIN  --  3.2* 2.9*   CBC:  Recent Labs Lab 01/01/15 0905 29-Jan-2015 1019 01/05/15 0346  WBC 1.8* 0.2* 0.3*  NEUTROABS 1.1* 0.0* 0.0*  HGB 10.5* 9.2* 8.7*  HCT 30.6* 27.2* 25.3*  MCV 83 81.7 81.9  PLT 71* 48* 44*     Recent Results (from the past 240 hour(s))  Culture, blood (routine x 2)     Status: None (Preliminary result)   Collection Time: Jan 29, 2015 10:19 AM  Result Value Ref Range Status   Specimen Description BLOOD PORTA CATH  Final   Special Requests BOTTLES DRAWN AEROBIC AND ANAEROBIC 5 CC EACH  Final   Culture   Final           BLOOD CULTURE RECEIVED NO GROWTH TO DATE CULTURE WILL BE HELD FOR 5 DAYS BEFORE ISSUING A FINAL NEGATIVE REPORT Performed at [facility]    Report Status PENDING  Incomplete  Culture, blood (routine x 2)     Status: None (Preliminary result)   Collection Time: 29-Jan-2015 10:35 AM  Result Value Ref Range Status   Specimen Description BLOOD LEFT ANTECUBITAL  Final   Special Requests BOTTLES DRAWN AEROBIC AND ANAEROBIC 5 CC EACH  Final   Culture   Final           BLOOD CULTURE RECEIVED NO GROWTH TO DATE CULTURE WILL BE HELD FOR 5 DAYS BEFORE ISSUING A FINAL NEGATIVE REPORT Performed at [facility]    Report Status PENDING  Incomplete      Studies/Results: Ct Orbits W/cm  01/29/15   CLINICAL DATA:  Lymphoma. Facial swelling. Left eye pain and swelling. Symptoms for 1 day.  EXAM: CT ORBITS WITH CONTRAST  TECHNIQUE: Multidetector CT imaging of the orbits was performed following the bolus administration of intravenous contrast.  CONTRAST:  158mL OMNIPAQUE IOHEXOL 300 MG/ML  SOLN  COMPARISON:  09/16/2014  FINDINGS: The left globe is within normal limits. There is no evidence of abscess in the left orbit. The right orbit is unremarkable. Mucous retention cyst in the left maxillary sinus. Visualized paranasal sinuses are otherwise clear. There is fluid in the left mastoid air cells. No destructive bone lesion. No fracture.  IMPRESSION: No evidence of orbital abscess.  There is fluid in the left mastoid air cells. An inflammatory process is not excluded.   Electronically Signed   By: Marybelle Killings M.D.   On: 01/29/15 12:11    Medications:  Scheduled: . ampicillin-sulbactam (UNASYN) IV  3 g Intravenous Q6H  . antiseptic oral rinse  7 mL Mouth Rinse BID  . budesonide (PULMICORT) nebulizer solution  0.25 mg Nebulization BID  . docusate sodium  100 mg Oral BID  . famotidine  20 mg Oral QHS  . feeding supplement (ENSURE ENLIVE)  237 mL Oral BID BM  . fluconazole  100 mg Oral Daily  . levothyroxine  50 mcg Oral QAC breakfast  . megestrol  400 mg Oral BID  . sucralfate  1 g Oral TID WC & HS  . Tbo-Filgrastim  300 mcg  Subcutaneous Q24H  . valACYclovir  1,000 mg Oral Daily  . vancomycin  500 mg Intravenous Q12H   Continuous: . sodium chloride 75 mL/hr at 29-Jan-2015 1758   EXN:[id number] **OR** acetaminophen, ALPRAZolam, morphine injection, ondansetron **OR** ondansetron (ZOFRAN) IV, oxyCODONE, sodium chloride  Assessment/Plan:  Principal Problem:   Preseptal cellulitis of left eye Active Problems:   Chronic obstructive airway disease with asthma   Essential tremor   Mantle cell lymphoma   Chemotherapy induced neutropenia   Thrombocytopenia  Normocytic anemia    Periorbital/preseptal cellulitis of the left eye No orbital involvement based on CT scan. Continue with vancomycin and Unasyn. Blood cultures are pending. Pain control.  Acute right lower extremity DVT Discussed with Dr. Burr Medico with oncology. Her thrombocytopenia makes this a very tricky situation. For now, we will start her on intravenous heparin. Monitor her counts closely. Discussed also with the patient and risks and benefits explained. If there are complications, IVC filter may have to be considered.  Pancytopenia, especially chemotherapy induced neutropenia Continue Neupogen. WBC remain low, along with platelets and hemoglobin. No active bleeding. No indication for blood transfusion. Thrombocytopenia could also be due to consumption.  History of mantle cell lymphoma on chemotherapy Last chemotherapy was on April 13. Chemotherapy was canceled on April 20 due to neutropenia.   History of hypothyroidism Continue with levothyroxine.  History of asthma/COPD Continue with her inhalers.  DVT Prophylaxis: On full dose IV heparin as discussed above    Code Status: Full code  Family Communication: Discussed with the patient  Disposition Plan: Not ready for discharge. PT and OT following.     LOS: 1 day   [facility] Pager [phone] 01/05/2015, 10:02 AM  If 7PM-7AM, please contact night-coverage at  [email or web address], password [facility]

## 2015-01-05 NOTE — Progress Notes (Signed)
ANTICOAGULATION CONSULT NOTE - Initial Consult  Pharmacy Consult for heparin Indication: new  DVT  Allergies  Allergen Reactions  . Actonel [Risedronate Sodium]     "made me choke"   . Fosamax [Alendronate]     "made me choke"  . Sulfa Antibiotics     Doesn't remember   . Tetracyclines & Related Rash    Patient Measurements: Height: 5\' 3"  (160 cm) Weight: 124 lb (56.246 kg) IBW/kg (Calculated) : 52.4 Heparin Dosing Weight: 56 kg  Vital Signs: Temp: 99.1 F (37.3 C) (04/24 0449) Temp Source: Oral (04/24 0449) BP: 111/61 mmHg (04/24 0449) Pulse Rate: 79 (04/24 0449)  Labs:  Recent Labs  01/04/15 1019 01/05/15 0346  HGB 9.2* 8.7*  HCT 27.2* 25.3*  PLT 48* 44*  CREATININE 0.75 0.77    Estimated Creatinine Clearance: 53.4 mL/min (by C-G formula based on Cr of 0.77).   Medical History: Past Medical History  Diagnosis Date  . OSA (obstructive sleep apnea)   . Hiatal hernia   . IBS (irritable bowel syndrome)   . Fibrocystic breast disease   . Asthma   . Diverticulosis   . Adenomatous colon polyp 1994  . Internal hemorrhoids   . Melanoma     Facial  . Hyperlipidemia   . Hypertension   . GERD (gastroesophageal reflux disease)   . Osteoporosis   . Osteoarthritis   . Vitamin D deficiency   . Prediabetes   . Unspecified hypothyroidism   . Family history of ischemic heart disease   . Mantle cell lymphoma 10/18/2014   Assessment: Patient is a 74 y.o F with mantle cell lymphoma and pancytopenia currently on chemotherapy. Pharmacy is familiar with him from current abx consults. LE doppler performed today showed extensive acute DVT.   - hgb 8.7, plt 44  Goal of Therapy:  Heparin level 0.3-0.7 units/ml Monitor platelets by anticoagulation protocol: Yes   Plan:  - heparin drip at 900 units/hr (no bolus per MD's request) - check 8 hour heparin level - monitor cbc and s/s bleeding  Rukia Mcgillivray P 01/05/2015,11:31 AM

## 2015-01-05 NOTE — Progress Notes (Signed)
*  Preliminary Results* Bilateral lower extremity venous duplex completed. The right lower extremity is positive for extensive acute deep vein thrombosis involving the right saphenofemoral junction, common femoral, femoral, profunda femoral, popliteal, posterior tibial, and peroneal veins. There is no obvious evidence of left lower extremity deep vein thrombosis or bilateral Baker's cyst.  Preliminary results discussed with Dr. Maryland Pink.  01/05/2015  Maudry Mayhew, RVT, RDCS, RDMS

## 2015-01-06 DIAGNOSIS — L03818 Cellulitis of other sites: Secondary | ICD-10-CM

## 2015-01-06 DIAGNOSIS — I82401 Acute embolism and thrombosis of unspecified deep veins of right lower extremity: Secondary | ICD-10-CM

## 2015-01-06 DIAGNOSIS — Z86718 Personal history of other venous thrombosis and embolism: Secondary | ICD-10-CM | POA: Diagnosis present

## 2015-01-06 LAB — CBC WITH DIFFERENTIAL/PLATELET
BASOS PCT: 0 % (ref 0–1)
Basophils Absolute: 0 10*3/uL (ref 0.0–0.1)
EOS ABS: 0 10*3/uL (ref 0.0–0.7)
EOS PCT: 0 % (ref 0–5)
HEMATOCRIT: 23.5 % — AB (ref 36.0–46.0)
HEMOGLOBIN: 8.1 g/dL — AB (ref 12.0–15.0)
LYMPHS ABS: 0.2 10*3/uL — AB (ref 0.7–4.0)
LYMPHS PCT: 71 % — AB (ref 12–46)
MCH: 28.1 pg (ref 26.0–34.0)
MCHC: 34.5 g/dL (ref 30.0–36.0)
MCV: 81.6 fL (ref 78.0–100.0)
MONOS PCT: 29 % — AB (ref 3–12)
Monocytes Absolute: 0.1 10*3/uL (ref 0.1–1.0)
NEUTROS ABS: 0 10*3/uL — AB (ref 1.7–7.7)
Neutrophils Relative %: 0 % — ABNORMAL LOW (ref 43–77)
PLATELETS: 41 10*3/uL — AB (ref 150–400)
RBC: 2.88 MIL/uL — ABNORMAL LOW (ref 3.87–5.11)
RDW: 16.6 % — ABNORMAL HIGH (ref 11.5–15.5)
WBC: 0.3 10*3/uL — CL (ref 4.0–10.5)

## 2015-01-06 LAB — BASIC METABOLIC PANEL
Anion gap: 5 (ref 5–15)
BUN: 11 mg/dL (ref 6–23)
CALCIUM: 8.2 mg/dL — AB (ref 8.4–10.5)
CO2: 21 mmol/L (ref 19–32)
Chloride: 109 mmol/L (ref 96–112)
Creatinine, Ser: 0.66 mg/dL (ref 0.50–1.10)
GFR calc Af Amer: >90 mL/min (ref >90)
GFR calc non Af Amer: 87 mL/min — ABNORMAL LOW (ref >90)
Glucose, Bld: 123 mg/dL — ABNORMAL HIGH (ref 70–99)
Potassium: 3.2 mmol/L — ABNORMAL LOW (ref 3.5–5.1)
Sodium: 135 mmol/L (ref 135–145)

## 2015-01-06 LAB — VANCOMYCIN, TROUGH: VANCOMYCIN TR: 7.2 ug/mL — AB (ref 10.0–20.0)

## 2015-01-06 LAB — HEPARIN LEVEL (UNFRACTIONATED)
HEPARIN UNFRACTIONATED: 0.53 [IU]/mL (ref 0.30–0.70)
Heparin Unfractionated: 0.49 IU/mL (ref 0.30–0.70)

## 2015-01-06 MED ORDER — VANCOMYCIN HCL IN DEXTROSE 1-5 GM/200ML-% IV SOLN
1000.0000 mg | Freq: Two times a day (BID) | INTRAVENOUS | Status: DC
  Administered 2015-01-06 – 2015-01-09 (×8): 1000 mg via INTRAVENOUS
  Filled 2015-01-06 (×9): qty 200

## 2015-01-06 MED ORDER — POTASSIUM CHLORIDE CRYS ER 20 MEQ PO TBCR
40.0000 meq | EXTENDED_RELEASE_TABLET | Freq: Once | ORAL | Status: AC
  Administered 2015-01-06: 40 meq via ORAL
  Filled 2015-01-06: qty 2

## 2015-01-06 NOTE — Progress Notes (Signed)
INITIAL NUTRITION ASSESSMENT   Pt meets criteria for NON-SEVERE (moderate) MALNUTRITION in the context of chronic illness, cancer as evidenced by 14 lb weight loss (10% body weight) in 5 months, moderate bilateral ankle edema.  DOCUMENTATION CODES Per approved criteria  -Non-severe (moderate) malnutrition in the context of chronic illness   INTERVENTION: - Continue Regular diet - Continue Ensure Enlive po BID, each supplement provides 350 kcal and 20 grams of protein - RD to continue to monitor for needs  NUTRITION DIAGNOSIS: Increased protein-energy needs related to catabolic illness, cancer as evidenced by pt with mantle cell lymphoma currently on chemotherapy.   Goal: Pt to meet >/= 90% estimated nutrition needs  Monitor:  Meal and supplement intakes, weight trends, labs, I/O's  Reason for Assessment: Malnutrition Screening Tool  74 y.o. female  Admitting Dx: Preseptal cellulitis of left eye  ASSESSMENT: Pt is a 74 y.o. female with hx of asthma, hypothyroidism, and mantle cell lymphoma. Her last chemotherapy treatment was 4/13 and treatment scheduled for 4/20 was canceled due to low WBC count. Pt with L eye cellulitis on admission.  Pt seen for MST: 3. BMI indicates normal weight status. Physical assessment does not show muscle or fat wasting but does show moderate BLE edema, mainly around ankle area. She states that this is unusual for L ankle and that it is worse in R ankle than usual.  She states that for breakfast she had omelet, bagel, fruit, and juice and ate the majority of this. PTA she had a good appetite but was experiencing a lost of taste due to chemo effects. She states that at home she was drinking Ensure and that her husband would use it to make a milkshake of Ensure, ice cream, bananas, and peanut butter.   Pt reports UBW of 138 lbs and that she last weighed this in December.This indicates 14 lb weight loss (10% body weight) 5 month. Pt states this was due to  decreased intakes due to effects of chemotherapy. Per chart review, she consumed 100% breakfast and 75% lunch yesterday.  Pt and husband request meal planning information PTA. Pt reports that she does not go to the [facility] here, but that she has been put into contact with the RD there Raford Pitcher). Informed her that she would be the best resource for this information. Pt appreciative and verbalizes understanding.  Likely meeting needs. Labs and medications reviewed; K: 3.2 mmol/L, pt on Megace here and PTA.  Height: Ht Readings from Last 1 Encounters:  01/04/15 5\' 3"  (1.6 m)    Weight: Wt Readings from Last 1 Encounters:  01/04/15 124 lb (56.246 kg)    Ideal Body Weight: 115 lbs (52.27 kg)  % Ideal Body Weight: 108%  Wt Readings from Last 10 Encounters:  01/04/15 124 lb (56.246 kg)  12/25/14 124 lb (56.246 kg)  12/16/14 126 lb (57.153 kg)  12/04/14 125 lb (56.7 kg)  11/13/14 127 lb (57.607 kg)  10/23/14 130 lb (58.968 kg)  10/10/14 156 lb (70.761 kg)  10/09/14 131 lb (59.421 kg)  09/18/14 135 lb (61.236 kg)  09/10/14 133 lb (60.328 kg)    Usual Body Weight: 138 lbs (62.73 kg)  % Usual Body Weight: 90%  BMI:  Body mass index is 21.97 kg/(m^2).  Estimated Nutritional Needs: Kcal: 1700-1900 Protein: 70-90 grams Fluid: 2L/day  Skin: WDL  Diet Order: Diet regular Room service appropriate?: Yes; Fluid consistency:: Thin  EDUCATION NEEDS: -No education needs identified at this time   Intake/Output Summary (Last 24  hours) at 01/06/15 1106 Last data filed at 01/06/15 0949  Gross per 24 hour  Intake 2017.73 ml  Output   1400 ml  Net 617.73 ml    Last BM: PTA   Labs:   Recent Labs Lab 01/04/15 1019 01/05/15 0346 01/06/15 0442  NA 134* 132* 135  K 3.7 3.7 3.2*  CL 106 107 109  CO2 22 21 21   BUN 12 13 11   CREATININE 0.75 0.77 0.66  CALCIUM 8.6 8.1* 8.2*  GLUCOSE 89 106* 123*    CBG (last 3)  No results for input(s): GLUCAP in the last 72  hours.  Scheduled Meds: . ampicillin-sulbactam (UNASYN) IV  3 g Intravenous Q6H  . antiseptic oral rinse  7 mL Mouth Rinse BID  . budesonide (PULMICORT) nebulizer solution  0.25 mg Nebulization BID  . docusate sodium  100 mg Oral BID  . famotidine  20 mg Oral QHS  . feeding supplement (ENSURE ENLIVE)  237 mL Oral BID BM  . fluconazole  100 mg Oral Daily  . levothyroxine  50 mcg Oral QAC breakfast  . megestrol  400 mg Oral BID  . sucralfate  1 g Oral TID WC & HS  . Tbo-Filgrastim  300 mcg Subcutaneous Q24H  . valACYclovir  1,000 mg Oral Daily  . vancomycin  1,000 mg Intravenous Q12H    Continuous Infusions: . heparin 1,000 Units/hr (01/06/15 1030)    Past Medical History  Diagnosis Date  . OSA (obstructive sleep apnea)   . Hiatal hernia   . IBS (irritable bowel syndrome)   . Fibrocystic breast disease   . Asthma   . Diverticulosis   . Adenomatous colon polyp 1994  . Internal hemorrhoids   . Melanoma     Facial  . Hyperlipidemia   . Hypertension   . GERD (gastroesophageal reflux disease)   . Osteoporosis   . Osteoarthritis   . Vitamin D deficiency   . Prediabetes   . Unspecified hypothyroidism   . Family history of ischemic heart disease   . Mantle cell lymphoma 10/18/2014    Past Surgical History  Procedure Laterality Date  . Appendectomy    . Tonsillectomy    . Bunionectomy    . Knee arthroscopy    . Umbilical hernia repair      Jarome Matin, RD, LDN Inpatient Clinical Dietitian Pager # [phone] After hours/weekend pager # [phone]

## 2015-01-06 NOTE — Progress Notes (Signed)
[facility] PROGRESS NOTE  Shannon Parsons ZJI:[id number] DOB: 08-01-1941 DOA: 01/04/2015  PCP: Alesia Richards, MD  Brief HPI: 74 year old Caucasian female with a past medical history of mantle cell lymphoma on chemotherapy was last chemotherapy on April 13 presented with pain in the left eye. She had evidence for left eyelid cellulitis along with surrounding area. There is no evidence for orbital cellulitis on CT scan. She was admitted to the hospital with broad-spectrum antibiotic coverage. She was also neutropenic and was started on Neupogen. She was noted to have enlargement of her right leg and is noted to have an acute DVT.  Past medical history:  Past Medical History  Diagnosis Date  . OSA (obstructive sleep apnea)   . Hiatal hernia   . IBS (irritable bowel syndrome)   . Fibrocystic breast disease   . Asthma   . Diverticulosis   . Adenomatous colon polyp 1994  . Internal hemorrhoids   . Melanoma     Facial  . Hyperlipidemia   . Hypertension   . GERD (gastroesophageal reflux disease)   . Osteoporosis   . Osteoarthritis   . Vitamin D deficiency   . Prediabetes   . Unspecified hypothyroidism   . Family history of ischemic heart disease   . Mantle cell lymphoma 10/18/2014    Consultants: Oncologist  Procedures:  Lower extremity venous Dopplers Extensive right lower extremity DVT  Antibiotics: Vancomycin and Unasyn 4/23  Subjective: Patient feels better this morning. Pain in the left eye is better. No new complaints. No chest pain or shortness of breath.  Objective: Vital Signs  Filed Vitals:   01/05/15 2001 01/05/15 2100 01/06/15 0452 01/06/15 0756  BP:  121/74 122/60   Pulse: 100 96 91   Temp:  100.6 F (38.1 C) 99 F (37.2 C)   TempSrc:  Oral Oral   Resp: 20 20 20    Height:      Weight:      SpO2:  100% 100% 98%    Intake/Output Summary (Last 24 hours) at 01/06/15 0905 Last data filed at 01/06/15 0500  Gross per 24 hour  Intake  1897.73 ml  Output   1350 ml  Net 547.73 ml   Filed Weights   01/04/15 1625  Weight: 56.246 kg (124 lb)    General appearance: alert, cooperative, appears stated age and no distress Left eye remains somewhat swollen. Erythema present. Warm to touch. Tender to palpation. Not much change. Resp: clear to auscultation bilaterally Cardio: regular rate and rhythm, S1, S2 normal, no murmur, click, rub or gallop GI: soft, non-tender; bowel sounds normal; no masses,  no organomegaly Extremities: Right leg larger than left Pulses: 2+ and symmetric Neurologic: No focal deficits  Lab Results:  Basic Metabolic Panel:  Recent Labs Lab 01/01/15 0935 01/04/15 1019 01/05/15 0346 01/06/15 0442  NA 136 134* 132* 135  K 3.3 3.7 3.7 3.2*  CL 104 106 107 109  CO2 24 22 21 21   GLUCOSE 82 89 106* 123*  BUN 16 12 13 11   CREATININE 1.0 0.75 0.77 0.66  CALCIUM 9.3 8.6 8.1* 8.2*   Liver Function Tests:  Recent Labs Lab 01/01/15 0935 01/04/15 1019 01/05/15 0346  AST 25 22 17   ALT 20 13 13   ALKPHOS 34 31* 27*  BILITOT 0.80 0.7 0.7  PROT 6.2* 5.6* 5.3*  ALBUMIN  --  3.2* 2.9*   CBC:  Recent Labs Lab 01/01/15 0905 01/04/15 1019 01/05/15 0346 01/06/15 0442  WBC 1.8* 0.2* 0.3* 0.3*  NEUTROABS 1.1* 0.0* 0.0* 0.0*  HGB 10.5* 9.2* 8.7* 8.1*  HCT 30.6* 27.2* 25.3* 23.5*  MCV 83 81.7 81.9 81.6  PLT 71* 48* 44* 41*     Recent Results (from the past 240 hour(s))  Culture, blood (routine x 2)     Status: None (Preliminary result)   Collection Time: Jan 17, 2015 10:19 AM  Result Value Ref Range Status   Specimen Description BLOOD PORTA CATH  Final   Special Requests BOTTLES DRAWN AEROBIC AND ANAEROBIC 5 CC EACH  Final   Culture   Final           BLOOD CULTURE RECEIVED NO GROWTH TO DATE CULTURE WILL BE HELD FOR 5 DAYS BEFORE ISSUING A FINAL NEGATIVE REPORT Performed at [facility]    Report Status PENDING  Incomplete  Culture, blood (routine x 2)     Status: None (Preliminary  result)   Collection Time: 2015-01-17 10:35 AM  Result Value Ref Range Status   Specimen Description BLOOD LEFT ANTECUBITAL  Final   Special Requests BOTTLES DRAWN AEROBIC AND ANAEROBIC 5 CC EACH  Final   Culture   Final           BLOOD CULTURE RECEIVED NO GROWTH TO DATE CULTURE WILL BE HELD FOR 5 DAYS BEFORE ISSUING A FINAL NEGATIVE REPORT Performed at [facility]    Report Status PENDING  Incomplete      Studies/Results: Ct Orbits W/cm  01-17-15   CLINICAL DATA:  Lymphoma. Facial swelling. Left eye pain and swelling. Symptoms for 1 day.  EXAM: CT ORBITS WITH CONTRAST  TECHNIQUE: Multidetector CT imaging of the orbits was performed following the bolus administration of intravenous contrast.  CONTRAST:  146mL OMNIPAQUE IOHEXOL 300 MG/ML  SOLN  COMPARISON:  09/16/2014  FINDINGS: The left globe is within normal limits. There is no evidence of abscess in the left orbit. The right orbit is unremarkable. Mucous retention cyst in the left maxillary sinus. Visualized paranasal sinuses are otherwise clear. There is fluid in the left mastoid air cells. No destructive bone lesion. No fracture.  IMPRESSION: No evidence of orbital abscess.  There is fluid in the left mastoid air cells. An inflammatory process is not excluded.   Electronically Signed   By: Marybelle Killings M.D.   On: 01-17-15 12:11    Medications:  Scheduled: . ampicillin-sulbactam (UNASYN) IV  3 g Intravenous Q6H  . antiseptic oral rinse  7 mL Mouth Rinse BID  . budesonide (PULMICORT) nebulizer solution  0.25 mg Nebulization BID  . docusate sodium  100 mg Oral BID  . famotidine  20 mg Oral QHS  . feeding supplement (ENSURE ENLIVE)  237 mL Oral BID BM  . fluconazole  100 mg Oral Daily  . levothyroxine  50 mcg Oral QAC breakfast  . megestrol  400 mg Oral BID  . sucralfate  1 g Oral TID WC & HS  . Tbo-Filgrastim  300 mcg Subcutaneous Q24H  . valACYclovir  1,000 mg Oral Daily  . vancomycin  500 mg Intravenous Q12H    Continuous: . heparin 1,000 Units/hr (01/05/15 2107)   DVV:OHYWVPXTGGYIR **OR** acetaminophen, ALPRAZolam, morphine injection, ondansetron **OR** ondansetron (ZOFRAN) IV, oxyCODONE, sodium chloride  Assessment/Plan:  Principal Problem:   Preseptal cellulitis of left eye Active Problems:   Chronic obstructive airway disease with asthma   Essential tremor   Mantle cell lymphoma   Chemotherapy induced neutropenia   Thrombocytopenia   Normocytic anemia    Periorbital/preseptal cellulitis of the left eye  Improving slowly. Continue with vancomycin and Unasyn. Blood cultures are pending. Pain management. No orbital involvement based on CT scan.   Acute right lower extremity DVT Continue IV heparin. Her thrombocytopenia makes this a very tricky situation. Oncology following as well. Continue to monitor her counts closely. If there are complications, IVC filter may have to be considered.  Pancytopenia, especially chemotherapy induced neutropenia Continue Neupogen. Dose was increased today by her oncologist. WBC remain low, along with platelets and hemoglobin. Hemoglobin slightly lower than yesterday. No active bleeding. No indication for blood transfusion. Thrombocytopenia could also be due to consumption.  History of mantle cell lymphoma on chemotherapy Last chemotherapy was on April 13. Chemotherapy was canceled on April 20 due to neutropenia.   History of hypothyroidism Continue with levothyroxine.  History of asthma/COPD Continue with her inhalers.  Hypokalemia Replete orally.  DVT Prophylaxis: On full dose IV heparin as discussed above    Code Status: Full code  Family Communication: Discussed with the patient  Disposition Plan: Not ready for discharge. PT and OT following. Will likely return home when improved.     LOS: 2 days   [facility] Pager [phone] 01/06/2015, 9:05 AM  If 7PM-7AM, please contact night-coverage at [email or web address],  password [facility]

## 2015-01-06 NOTE — Progress Notes (Signed)
Physical Therapy Treatment Patient Details Name: Shannon Parsons MRN: [id number] DOB: 1940-10-03 Today's Date: 01/06/2015    History of Present Illness Shannon Parsons is a 74 y.o. female with a past medical history of mantle cell lymphoma on chemotherapy,  history of hypothyroidism, history of asthma who was in her usual state of health until 01/04/15  yesterday when she started noticing pain in the left eye. noted to have cellulitis. Also found to have R LE DVT,    PT Comments    Pt progressing well with mobility.  Assisted with amb in hallway HHA.    Follow Up Recommendations  No PT follow up (pt declines HH PT "i don't think I need it")     Equipment Recommendations  None recommended by PT (pt stated she plans to borrow a walker from a friend)    Recommendations for Other Services       Precautions / Restrictions Precautions Precautions: Fall Restrictions Weight Bearing Restrictions: No    Mobility  Bed Mobility               General bed mobility comments: Pt OOB in recliner  Transfers Overall transfer level: Needs assistance Equipment used: 1 person hand held assist Transfers: Sit to/from Stand Sit to Stand: Min guard         General transfer comment: one VC on safety with turns  Ambulation/Gait Ambulation/Gait assistance: Min assist Ambulation Distance (Feet): 225 Feet Assistive device: 1 person hand held assist Gait Pattern/deviations: Drifts right/left Gait velocity: decreased   General Gait Details: Pt tolerated amb in hallway HHA.  noted lateral drifing. instructed pt the use of AD would be beneficial.  Noted slight unsteadiness.   Stairs            Wheelchair Mobility    Modified Rankin (Stroke Patients Only)       Balance                                    Cognition Arousal/Alertness: Awake/alert Behavior During Therapy: WFL for tasks assessed/performed Overall Cognitive Status: Within Functional Limits for  tasks assessed                      Exercises      General Comments        Pertinent Vitals/Pain Pain Assessment: 0-10 Pain Score: 3  Pain Location: L eye area Pain Descriptors / Indicators: Discomfort Pain Intervention(s): Monitored during session    Home Living                      Prior Function            PT Goals (current goals can now be found in the care plan section) Progress towards PT goals: Progressing toward goals    Frequency  Min 3X/week    PT Plan      Co-evaluation             End of Session Equipment Utilized During Treatment: Gait belt Activity Tolerance: Patient tolerated treatment well Patient left: in chair;with call bell/phone within reach     Time: 1000-1018 PT Time Calculation (min) (ACUTE ONLY): 18 min  Charges:  $Gait Training: 8-22 mins                    G Codes:      Courtnei Ruddell  PTA [employer]  Acute  Rehab Pager      [phone]

## 2015-01-06 NOTE — Evaluation (Signed)
Occupational Therapy Evaluation Patient Details Name: Shannon Parsons MRN: [id number] DOB: November 27, 1940 Today's Date: 01/06/2015    History of Present Illness Shannon Parsons is a 74 y.o. female with a past medical history of mantle cell lymphoma on chemotherapy,  history of hypothyroidism, history of asthma who was in her usual state of health until 01/04/15  yesterday when she started noticing pain in the left eye. noted to have cellulitis. Also found to have R LE DVT,   Clinical Impression   Pt admitted with L eye cellulitis. Pt currently with functional limitations due to the deficits listed below (see OT Problem List).  Pt will benefit from skilled OT to increase their safety and independence with ADL and functional mobility for ADL to facilitate discharge to venue listed below.      Follow Up Recommendations  Supervision/Assistance - 24 hour;Other (comment) (pt does not want HHOT)    Equipment Recommendations  Tub/shower seat;3 in 1 bedside comode;Other (comment) (pt will decide if she wants)       Precautions / Restrictions Precautions Precautions: Fall Restrictions Weight Bearing Restrictions: No      Mobility Bed Mobility               General bed mobility comments: Pt OOB in recliner  Transfers Overall transfer level: Needs assistance Equipment used: 1 person hand held assist Transfers: Sit to/from Stand Sit to Stand: Min guard         General transfer comment: one VC on safety with turns         ADL Overall ADL's : Needs assistance/impaired Eating/Feeding: Minimal assistance;Sitting   Grooming: Set up;Sitting   Upper Body Bathing: Minimal assitance   Lower Body Bathing: Moderate assistance;Sit to/from stand   Upper Body Dressing : Minimal assistance;Sitting   Lower Body Dressing: Moderate assistance;Sit to/from stand                 General ADL Comments: BUE tremors do affect self feeding.  sometimes worse than other times.  Educated  pt on option for weighted fork.  Pt states her husband is not much of a caregiver.  Explained benefits of [place] and pt does not feel she needs.               Pertinent Vitals/Pain Pain Assessment: 0-10 Pain Score: 2  Pain Location: l eye area Pain Descriptors / Indicators: Other (Comment) (swollen) Pain Intervention(s): Monitored during session        Extremity/Trunk Assessment Upper Extremity Assessment Upper Extremity Assessment: Generalized weakness (pt states tremors began approx 5 monthes ago - wonders if it is chemo related)           Communication Communication Communication: No difficulties   Cognition Arousal/Alertness: Awake/alert Behavior During Therapy: WFL for tasks assessed/performed Overall Cognitive Status: Within Functional Limits for tasks assessed                                Home Living Family/patient expects to be discharged to:: Private residence Living Arrangements: Spouse/significant other Available Help at Discharge: Family Type of Home: House Home Access: Stairs to enter [employer] of Steps: 2 Entrance Stairs-Rails: Right Home Layout: Two level;Able to live on main level with bedroom/bathroom     Bathroom Shower/Tub: Teacher, early years/pre: Standard     Home Equipment: None;Grab bars - tub/shower          Prior Functioning/Environment Level of  Independence: Independent             OT Diagnosis: Generalized weakness   OT Problem List: Decreased strength;Decreased activity tolerance   OT Treatment/Interventions: Self-care/ADL training;DME and/or AE instruction;Patient/family education    OT Goals(Current goals can be found in the care plan section) Acute Rehab OT Goals Patient Stated Goal: feel better- its been so long OT Goal Formulation: With patient Time For Goal Achievement: 02/19/15 Potential to Achieve Goals: Good ADL Goals Pt Will Perform Grooming: with modified  independence;standing Pt Will Perform Upper Body Dressing: with modified independence;sitting Pt Will Perform Lower Body Dressing: with modified independence;sit to/from stand Pt Will Transfer to Toilet: ambulating;with modified independence;regular height toilet Pt Will Perform Toileting - Clothing Manipulation and hygiene: with modified independence;sit to/from stand Additional ADL Goal #1: Pt will verbalize energy conservation principles in relation to ADL activity and ways to save energy  at mod I level  OT Frequency: Min 2X/week              End of Session Nurse Communication: Mobility status  Activity Tolerance: Patient tolerated treatment well Patient left: in bed;with call bell/phone within reach   Time: 1330-1355 OT Time Calculation (min): 25 min Charges:  OT General Charges $OT Visit: 1 Procedure OT Evaluation $Initial OT Evaluation Tier I: 1 Procedure OT Treatments $Self Care/Home Management : 8-22 mins G-Codes:    Payton Mccallum D 01/08/2015, 2:37 PM

## 2015-01-06 NOTE — Progress Notes (Addendum)
I very much appreciate the outstanding help from the hospitalist. I also appreciate the consultation by Dr. Burr Medico.  She has the left periorbital  cellulitis. She has a DVT in the right leg. She is on IV antibiotics.  The real problem is that she has no white cells. I will have to increase her Neupogen dose up to 480 g. She did not get treated last week with Velcade. I'm glad that I held off on treatment.  She is on heparin for the DVT. I think that she should stay on heparin until she is discharged and then we can get onto with a new oral anticoagulants.  She's had for a full cycles of treatment. She's had a very good response. Her last PET scan essentially showed that she was in remission. However, with mantle cell lymphoma, I just don't think that she is going be cured as she is able to get to a transplant. She really has not been a candidate for transplant in my opinion because of other health issues.  I may have to switch treatments on her.  She's had no cough. She's had no diarrhea. She's had no mouth sores. She's had no rashes, outside of that with the saline is.  Her vital signs are all stable. Temperature is 99 degrees. Blood pressure 122/60. Pulse is 91. Her head and neck exam shows the swelling and discoloration about the left eye. She has no oral lesions. There is no adenopathy in the neck. Lungs are clear. Cardiac exam regular rate and rhythm with no murmurs, rubs or bruits. Abdomen is soft. She has good bowel sounds. There is no fluid wave. There is no palpable liver or spleen tip. Skin exam shows no rashes, ecchymoses or petechia. Neurological exam is nonfocal.  [name] has mantle cell lymphoma. Again, she has responded well.  I would continue her on the IV antibiotics. Hopefully,her white cell count started coming up. This will be the only way for this saline as to improve.  I noted that her hemoglobin is only 8.1. We will follow this. It is conceivable that she may need to be  transfused.  I would continue heparin until she is discharged and then we will put her onone of the new oral anticoagulants.  We had a very good prayer session.  [name].  Ephesians 3:20

## 2015-01-06 NOTE — Progress Notes (Addendum)
ANTICOAGULATION CONSULT NOTE - Follow Up Consult  Pharmacy Consult for heparin Indication: new  DVT  Allergies  Allergen Reactions  . Actonel [Risedronate Sodium]     "made me choke"   . Fosamax [Alendronate]     "made me choke"  . Sulfa Antibiotics     Doesn't remember   . Tetracyclines & Related Rash    Patient Measurements: Height: 5\' 3"  (160 cm) Weight: 124 lb (56.246 kg) IBW/kg (Calculated) : 52.4 Heparin Dosing Weight: 56 kg  Vital Signs: Temp: 99 F (37.2 C) (04/25 0452) Temp Source: Oral (04/25 0452) BP: 122/60 mmHg (04/25 0452) Pulse Rate: 91 (04/25 0452)  Labs:  Recent Labs  01/04/15 1019 01/05/15 0346 01/05/15 2000 01/06/15 0442  HGB 9.2* 8.7*  --  8.1*  HCT 27.2* 25.3*  --  23.5*  PLT 48* 44*  --  41*  HEPARINUNFRC  --   --  0.23* 0.49  CREATININE 0.75 0.77  --  0.66    Estimated Creatinine Clearance: 53.4 mL/min (by C-G formula based on Cr of 0.66).   Medical History: Past Medical History  Diagnosis Date  . OSA (obstructive sleep apnea)   . Hiatal hernia   . IBS (irritable bowel syndrome)   . Fibrocystic breast disease   . Asthma   . Diverticulosis   . Adenomatous colon polyp 1994  . Internal hemorrhoids   . Melanoma     Facial  . Hyperlipidemia   . Hypertension   . GERD (gastroesophageal reflux disease)   . Osteoporosis   . Osteoarthritis   . Vitamin D deficiency   . Prediabetes   . Unspecified hypothyroidism   . Family history of ischemic heart disease   . Mantle cell lymphoma 10/18/2014   Assessment: Patient is a 74 y.o F with mantle cell lymphoma and pancytopenia currently on chemotherapy. Pharmacy is familiar with him from current abx consults. LE doppler performed today showed extensive acute DVT.  Oncology recommending that patient remains on heparin until discharge and then switch to new oral anticoagulant.  Today, 01/06/2015: - Heparin level therapeutic with infusion at 1000 units/hr - Hgb decreased to 8.1. Platelets low,  relatively stable.  No bleeding documented.  Hem/Onc consult notes patient may need transfusion. - Renal function stable.  Goal of Therapy:  Heparin level 0.3-0.7 units/ml Monitor platelets by anticoagulation protocol: Yes   Plan:  - Continue heparin drip at 1000 units/hr. - Recheck heparin level in 8 hours. - Monitor cbc and s/s bleeding.  F/u hem/onc recommendations.  Hershal Coria 01/06/2015,7:31 AM   ADDENDUM  Repeat heparin level therapeutic at 0.53. Plan: Continue heparin infusion at 1000 units/hr.  Check HL and CBC in AM.  Hershal Coria, PharmD, BCPS Pager: [phone] 01/06/2015 1:51 PM

## 2015-01-06 NOTE — Progress Notes (Signed)
ANTIBIOTIC CONSULT NOTE - Follow Up  Pharmacy Consult for vancomycin, ampicillin/sulbactam Indication: periorbital/preseptal cellulitis  Allergies  Allergen Reactions  . Actonel [Risedronate Sodium]     "made me choke"   . Fosamax [Alendronate]     "made me choke"  . Sulfa Antibiotics     Doesn't remember   . Tetracyclines & Related Rash    Patient Measurements: Height: 5\' 3"  (160 cm) Weight: 124 lb (56.246 kg) IBW/kg (Calculated) : 52.4 Adjusted Body Weight:   Vital Signs: Temp: 99 F (37.2 C) (04/25 0452) Temp Source: Oral (04/25 0452) BP: 122/60 mmHg (04/25 0452) Pulse Rate: 91 (04/25 0452) Intake/Output from previous day: 04/24 0701 - 04/25 0700 In: 2137.7 [P.O.:600; I.V.:937.7; IV Piggyback:600] Out: 1350 [Urine:1350] Intake/Output from this shift: Total I/O In: 120 [P.O.:120] Out: -   Labs:  Recent Labs  01/04/15 1019 01/05/15 0346 01/06/15 0442  WBC 0.2* 0.3* 0.3*  HGB 9.2* 8.7* 8.1*  PLT 48* 44* 41*  CREATININE 0.75 0.77 0.66   Estimated Creatinine Clearance: 53.4 mL/min (by C-G formula based on Cr of 0.66).  Recent Labs  01/06/15 0900  VANCOTROUGH 7.2*     Microbiology: Recent Results (from the past 720 hour(s))  Culture, blood (routine x 2)     Status: None (Preliminary result)   Collection Time: 01/04/15 10:19 AM  Result Value Ref Range Status   Specimen Description BLOOD PORTA CATH  Final   Special Requests BOTTLES DRAWN AEROBIC AND ANAEROBIC 5 CC EACH  Final   Culture   Final           BLOOD CULTURE RECEIVED NO GROWTH TO DATE CULTURE WILL BE HELD FOR 5 DAYS BEFORE ISSUING A FINAL NEGATIVE REPORT Performed at [facility]    Report Status PENDING  Incomplete  Culture, blood (routine x 2)     Status: None (Preliminary result)   Collection Time: 01/04/15 10:35 AM  Result Value Ref Range Status   Specimen Description BLOOD LEFT ANTECUBITAL  Final   Special Requests BOTTLES DRAWN AEROBIC AND ANAEROBIC 5 CC EACH  Final   Culture    Final           BLOOD CULTURE RECEIVED NO GROWTH TO DATE CULTURE WILL BE HELD FOR 5 DAYS BEFORE ISSUING A FINAL NEGATIVE REPORT Performed at [facility]    Report Status PENDING  Incomplete    Medical History: Past Medical History  Diagnosis Date  . OSA (obstructive sleep apnea)   . Hiatal hernia   . IBS (irritable bowel syndrome)   . Fibrocystic breast disease   . Asthma   . Diverticulosis   . Adenomatous colon polyp 1994  . Internal hemorrhoids   . Melanoma     Facial  . Hyperlipidemia   . Hypertension   . GERD (gastroesophageal reflux disease)   . Osteoporosis   . Osteoarthritis   . Vitamin D deficiency   . Prediabetes   . Unspecified hypothyroidism   . Family history of ischemic heart disease   . Mantle cell lymphoma 10/18/2014   Assessment: 46 YOF presents with eye pain/swelling. She has h/o lymphoma with chemo 4/13.  She is noted to have periorbital/preseptal cellulitis of L eye.  Pharmacy asked to dose vancomycin and Unasyn on admission.  First doses given in ED. She neutropenic at admission w/ undetectable ANC.  Noted that patient takes Famvir, Levaquin, Diflucan PTA daily for prophylaxis per oncology.    4/23 >> Vancomycin >> 4/23 >> Unasyn >>  Today is  day #3 Vancomycin/Unasyn.  Vancomycin trough low today with 500 mg q12h dosing.  Granix started 4/23. Patient remains neutropenic and had a fever of 100.6 last night.  Blood cultures ngtd.  Renal function stable.  Goal of Therapy:  Vancomycin trough level 10-15 mcg/ml  Plan:   Adjust Vancomycin to 1g IV q12h.  Continue Unasyn 3gm IV q6h.  F/u SCr, trough levels, clinical course.  Hershal Coria, PharmD, BCPS Pager: [phone] 01/06/2015 10:03 AM

## 2015-01-06 NOTE — Care Management Note (Addendum)
Page 1 of 2   01/10/2015     1:14:57 PM CARE MANAGEMENT NOTE 01/10/2015  Patient:  Shannon Parsons, Shannon Parsons   Account Number:  [id number]  Date Initiated:  01/06/2015  Documentation initiated by:  Dessa Phi  Subjective/Objective Assessment:   74 y/o f admitted w/Preseptal cellulitis l eye.     Action/Plan:   From home.   Anticipated DC Date:  01/10/2015   Anticipated DC Plan:  [place]  CM consult      Choice offered to / List presented to:  C-1 Patient   DME arranged  3-N-1      DME agency  [facility] arranged  HH-2 PT  HH-1 RN  HH-3 OT      [place].   Status of service:  Completed, signed off Medicare Important Message given?  YES (If response is "NO", the following Medicare IM given date fields will be blank) Date Medicare IM given:  01/08/2015 Medicare IM given by:  Dessa Phi Date Additional Medicare IM given:  01/10/2015 Additional Medicare IM given by:  Dessa Phi  Discharge Disposition:  [place]  Per UR Regulation:  Reviewed for med. necessity/level of care/duration of stay  If discussed at [place] of Stay Meetings, dates discussed:   01/09/2015    Comments:  01/10/15 Dessa Phi RN BSN NCM [phone] d/c home w/[facility] hhc,has dme.  01/09/15 Dessa Phi RN BSN NCM [phone] Starting on xarelto.[facility] following for d/c.3n1-[facility] dme rep.  01/08/15 Dessa Phi RN BSN NCM [phone] HHRN/HHOT-[facility] rep aware & following.Patient recommended for tub seat/3n1-patient pleasantly declines-states she will have to wait until she gets home to check if she really needs it. [facility] chosen for HHPT.[facility] rep aware of HHPT order. Patient declines home rw-states she will get one from friend,spouse confirmed that they have a friend who will give them the rw,he has to pick it up.xarelto co-$30.Patient to receive xarelto starter kit from [facility] for  free 30 days enclosed.Patient voiced understanding.  01/07/15 Dessa Phi RN BSN NCM [phone] Informed patient of PT recommendations.Provided w/HHC agency list.Patient states she may get a rw from a friend.  01/06/15 Dessa Phi RN BSN NCM [phone] PT-HH,rw. OT-n/a;tub seat/shower seat,3n1.Will provide [facility] list for choice.will confirm HHC, & dme needs.

## 2015-01-07 LAB — CBC WITH DIFFERENTIAL/PLATELET
Basophils Absolute: 0 10*3/uL (ref 0.0–0.1)
Basophils Relative: 0 % (ref 0–1)
Eosinophils Absolute: 0 10*3/uL (ref 0.0–0.7)
Eosinophils Relative: 0 % (ref 0–5)
HEMATOCRIT: 22.7 % — AB (ref 36.0–46.0)
Hemoglobin: 7.8 g/dL — ABNORMAL LOW (ref 12.0–15.0)
LYMPHS PCT: 61 % — AB (ref 12–46)
Lymphs Abs: 0.4 10*3/uL — ABNORMAL LOW (ref 0.7–4.0)
MCH: 28 pg (ref 26.0–34.0)
MCHC: 34.4 g/dL (ref 30.0–36.0)
MCV: 81.4 fL (ref 78.0–100.0)
Monocytes Absolute: 0.2 10*3/uL (ref 0.1–1.0)
Monocytes Relative: 36 % — ABNORMAL HIGH (ref 3–12)
NEUTROS ABS: 0 10*3/uL — AB (ref 1.7–7.7)
Neutrophils Relative %: 3 % — ABNORMAL LOW (ref 43–77)
Platelets: 48 10*3/uL — ABNORMAL LOW (ref 150–400)
RBC: 2.79 MIL/uL — AB (ref 3.87–5.11)
RDW: 16.7 % — ABNORMAL HIGH (ref 11.5–15.5)
WBC: 0.6 10*3/uL — CL (ref 4.0–10.5)

## 2015-01-07 LAB — COMPREHENSIVE METABOLIC PANEL
ALK PHOS: 25 U/L — AB (ref 39–117)
ALT: 12 U/L (ref 0–35)
AST: 16 U/L (ref 0–37)
Albumin: 2.8 g/dL — ABNORMAL LOW (ref 3.5–5.2)
Anion gap: 3 — ABNORMAL LOW (ref 5–15)
BILIRUBIN TOTAL: 0.5 mg/dL (ref 0.3–1.2)
BUN: 9 mg/dL (ref 6–23)
CO2: 20 mmol/L (ref 19–32)
CREATININE: 0.71 mg/dL (ref 0.50–1.10)
Calcium: 8.2 mg/dL — ABNORMAL LOW (ref 8.4–10.5)
Chloride: 113 mmol/L — ABNORMAL HIGH (ref 96–112)
GFR, EST NON AFRICAN AMERICAN: 85 mL/min — AB (ref >90)
Glucose, Bld: 103 mg/dL — ABNORMAL HIGH (ref 70–99)
Potassium: 3.6 mmol/L (ref 3.5–5.1)
Sodium: 136 mmol/L (ref 135–145)
Total Protein: 5.2 g/dL — ABNORMAL LOW (ref 6.0–8.3)

## 2015-01-07 LAB — HEPARIN LEVEL (UNFRACTIONATED): Heparin Unfractionated: 0.39 IU/mL (ref 0.30–0.70)

## 2015-01-07 NOTE — Progress Notes (Signed)
[facility] PROGRESS NOTE  Shannon Parsons KGU:[id number] DOB: 1941/01/06 DOA: 01/04/2015  PCP: Alesia Richards, MD  Brief HPI: 74 year old Caucasian female with a past medical history of mantle cell lymphoma on chemotherapy was last chemotherapy on April 13 presented with pain in the left eye. She had evidence for left eyelid cellulitis along with surrounding area. There is no evidence for orbital cellulitis on CT scan. She was admitted to the hospital with broad-spectrum antibiotic coverage. She was also neutropenic and was started on Neupogen. She was noted to have enlargement of her right leg and is noted to have an acute DVT.  Past medical history:  Past Medical History  Diagnosis Date  . OSA (obstructive sleep apnea)   . Hiatal hernia   . IBS (irritable bowel syndrome)   . Fibrocystic breast disease   . Asthma   . Diverticulosis   . Adenomatous colon polyp 1994  . Internal hemorrhoids   . Melanoma     Facial  . Hyperlipidemia   . Hypertension   . GERD (gastroesophageal reflux disease)   . Osteoporosis   . Osteoarthritis   . Vitamin D deficiency   . Prediabetes   . Unspecified hypothyroidism   . Family history of ischemic heart disease   . Mantle cell lymphoma 10/18/2014    Consultants: Oncologist  Procedures:  Lower extremity venous Dopplers Extensive right lower extremity DVT  Antibiotics: Vancomycin and Unasyn 4/23  Subjective: Patient states that her left eye feels better. Denies visual disturbances. Denies chest pain, shortness of breath.  Objective: Vital Signs  Filed Vitals:   01/06/15 0756 01/06/15 2110 01/06/15 2233 01/07/15 0459  BP:  124/62  126/62  Pulse:  91  84  Temp:    98.5 F (36.9 C)  TempSrc:  Oral  Oral  Resp:  20  20  Height:      Weight:      SpO2: 98% 99% 98% 100%    Intake/Output Summary (Last 24 hours) at 01/07/15 1017 Last data filed at 01/07/15 0529  Gross per 24 hour  Intake    930 ml  Output   2450 ml    Net  -1520 ml   Filed Weights   01/04/15 1625  Weight: 56.246 kg (124 lb)    General appearance: alert, cooperative, appears stated age and no distress Left eye remains somewhat swollen. Erythema present. Warm to touch. Tender to palpation. Not much change in examination. Resp: clear to auscultation bilaterally Cardio: regular rate and rhythm, S1, S2 normal, no murmur, click, rub or gallop GI: soft, non-tender; bowel sounds normal; no masses,  no organomegaly Extremities: Right leg larger than left.  Pulses: 2+ and symmetric Neurologic: No focal deficits  Lab Results:  Basic Metabolic Panel:  Recent Labs Lab 01/01/15 0935 01/04/15 1019 01/05/15 0346 01/06/15 0442 01/07/15 0445  NA 136 134* 132* 135 136  K 3.3 3.7 3.7 3.2* 3.6  CL 104 106 107 109 113*  CO2 24 22 21 21 20   GLUCOSE 82 89 106* 123* 103*  BUN 16 12 13 11 9   CREATININE 1.0 0.75 0.77 0.66 0.71  CALCIUM 9.3 8.6 8.1* 8.2* 8.2*   Liver Function Tests:  Recent Labs Lab 01/01/15 0935 01/04/15 1019 01/05/15 0346 01/07/15 0445  AST 25 22 17 16   ALT 20 13 13 12   ALKPHOS 34 31* 27* 25*  BILITOT 0.80 0.7 0.7 0.5  PROT 6.2* 5.6* 5.3* 5.2*  ALBUMIN  --  3.2* 2.9* 2.8*   CBC:  Recent Labs Lab 01/01/15 0905 01/04/15 1019 01/05/15 0346 01/06/15 0442 01/07/15 0445  WBC 1.8* 0.2* 0.3* 0.3* 0.6*  NEUTROABS 1.1* 0.0* 0.0* 0.0* 0.0*  HGB 10.5* 9.2* 8.7* 8.1* 7.8*  HCT 30.6* 27.2* 25.3* 23.5* 22.7*  MCV 83 81.7 81.9 81.6 81.4  PLT 71* 48* 44* 41* 48*     Recent Results (from the past 240 hour(s))  Culture, blood (routine x 2)     Status: None (Preliminary result)   Collection Time: 01/04/15 10:19 AM  Result Value Ref Range Status   Specimen Description BLOOD PORTA CATH  Final   Special Requests BOTTLES DRAWN AEROBIC AND ANAEROBIC 5 CC EACH  Final   Culture   Final           BLOOD CULTURE RECEIVED NO GROWTH TO DATE CULTURE WILL BE HELD FOR 5 DAYS BEFORE ISSUING A FINAL NEGATIVE REPORT Performed at  [facility]    Report Status PENDING  Incomplete  Culture, blood (routine x 2)     Status: None (Preliminary result)   Collection Time: 01/04/15 10:35 AM  Result Value Ref Range Status   Specimen Description BLOOD LEFT ANTECUBITAL  Final   Special Requests BOTTLES DRAWN AEROBIC AND ANAEROBIC 5 CC EACH  Final   Culture   Final           BLOOD CULTURE RECEIVED NO GROWTH TO DATE CULTURE WILL BE HELD FOR 5 DAYS BEFORE ISSUING A FINAL NEGATIVE REPORT Performed at [facility]    Report Status PENDING  Incomplete      Studies/Results: No results found.  Medications:  Scheduled: . ampicillin-sulbactam (UNASYN) IV  3 g Intravenous Q6H  . antiseptic oral rinse  7 mL Mouth Rinse BID  . budesonide (PULMICORT) nebulizer solution  0.25 mg Nebulization BID  . docusate sodium  100 mg Oral BID  . famotidine  20 mg Oral QHS  . feeding supplement (ENSURE ENLIVE)  237 mL Oral BID BM  . fluconazole  100 mg Oral Daily  . levothyroxine  50 mcg Oral QAC breakfast  . megestrol  400 mg Oral BID  . sucralfate  1 g Oral TID WC & HS  . Tbo-Filgrastim  300 mcg Subcutaneous Q24H  . valACYclovir  1,000 mg Oral Daily  . vancomycin  1,000 mg Intravenous Q12H   Continuous: . heparin 1,000 Units/hr (01/06/15 1030)   QMV:HQIONGEXBMWUX **OR** acetaminophen, ALPRAZolam, morphine injection, ondansetron **OR** ondansetron (ZOFRAN) IV, oxyCODONE, sodium chloride  Assessment/Plan:  Principal Problem:   Preseptal cellulitis of left eye Active Problems:   Chronic obstructive airway disease with asthma   Essential tremor   Mantle cell lymphoma   Chemotherapy induced neutropenia   Thrombocytopenia   Normocytic anemia   Leg DVT (deep venous thromboembolism), acute    Periorbital/preseptal cellulitis of the left eye Improving slowly. Continue with vancomycin and Unasyn till neutrophil count improves. Blood cultures are negative so far. Pain management. No orbital involvement based on CT  scan.   Acute right lower extremity DVT Continue IV heparin. Her thrombocytopenia makes this a very tricky situation. Oncology following as well. Continue to monitor her counts closely. If there are complications, IVC filter may have to be considered.  Pancytopenia, especially chemotherapy induced neutropenia Continue Neupogen. Neutrophil counts remains low. Hemoglobin has dropped further. Oncologist wants to hold off on transfusion for now. No active bleeding noted. Thrombocytopenia also persist, but perhaps a little better today. Thrombocytopenia could also be due to consumption.  History of mantle cell lymphoma  on chemotherapy Last chemotherapy was on April 13. Chemotherapy was canceled on April 20 due to neutropenia.   History of hypothyroidism Continue with levothyroxine.  History of asthma/COPD Continue with her inhalers.  Hypokalemia Replete orally.  DVT Prophylaxis: On full dose IV heparin as discussed above    Code Status: Full code  Family Communication: Discussed with the patient  Disposition Plan: Not ready for discharge. PT and OT following. Will likely return home with home health when improved.     LOS: 3 days   [facility] Pager [phone] 01/07/2015, 10:17 AM  If 7PM-7AM, please contact night-coverage at [email or web address], password [facility]

## 2015-01-07 NOTE — Progress Notes (Signed)
Ms. Holts looks a little better. She has the cellulitis about the left eye. Her white cell count is starting to come up. This I think should improve her situation nicely.  Her cultures are still negative.  She still continues on IV antibiotics.  She is on heparin for the right leg DVT. I would keep her on heparin for right now and then switch over to Xarelto as an outpatient.  She is eating. There is no nausea or vomiting.  She's not having diarrhea.  Physical therapy is doing a great job in trying to strengthen her.  Her lab work today shows that her white cell count is 0.6. She has a high percentage of monocytes which is always a good indicator of a marrow recovery. She does have a hemoglobin of 7.8. We will have to watch this closely. I still like to try to avoid a transfusion if possible. Her platelet count is low but stable. Her electrolytes look okay. Her potassium is 3.6. Her liver test looked okay.  Her vital signs are all stable. Her temperature is 98.5. Pulse is 84. Blood pressure 126/62. Her head exam shows the cellulitis about the left eye. There is some slight swelling noted. She has some discoloration about the left eye. There is no adenopathy. She has no oral lesions. Lungs are clear. Cardiac exam regular rate and rhythm. Abdomen is soft. Extremities shows no clubbing, cyanosis or edema.  I think that we are going to change her chemotherapy program. We will see what her PET scan shows. She set up for this next week. I probably would consider using Treanda/Rituxan.  The staff on [address] is doing a great job with her.  I appreciate the outstanding care that she is getting from the hospitalists!!  Stormy Card 3:23

## 2015-01-07 NOTE — Progress Notes (Signed)
Occupational Therapy Treatment Patient Details Name: Shannon Parsons MRN: [id number] DOB: 1941/04/18 Today's Date: 01/07/2015    History of present illness Shannon Parsons is a 74 y.o. female with a past medical history of mantle cell lymphoma on chemotherapy,  history of hypothyroidism, history of asthma who was in her usual state of health until 01/04/15  yesterday when she started noticing pain in the left eye. noted to have cellulitis. Also found to have R LE DVT,   OT comments  Pt slowly improving.  Reviewed energy conservation techniques and provided pt with a handout to review these techniques. Pt with some swelling in B ankles therefore not wanting to walk much.  Ice on ankles.  Spoke with pt at length about the benefits of HHOT and pt agrees that it would be helpful for her at d/c.  Follow Up Recommendations  Home health OT;Supervision/Assistance - 24 hour    Equipment Recommendations  Tub/shower seat;3 in 1 bedside comode;Other (comment)    Recommendations for Other Services      Precautions / Restrictions Precautions Precautions: Fall Precaution Comments: R DVT, dx after PT ambulated., neutropenic Restrictions Weight Bearing Restrictions: No       Mobility Bed Mobility Overal bed mobility: Independent             General bed mobility comments: limited today due to swelling in Les.  Transfers Overall transfer level: Needs assistance Equipment used: 1 person hand held assist Transfers: Sit to/from Stand Sit to Stand: Min guard         General transfer comment: Overall does ver well. Pt states when her ankles are swollen sometimes it hursts to get up.    Balance Overall balance assessment: Needs assistance Sitting-balance support: Feet supported Sitting balance-Leahy Scale: Good     Standing balance support: Bilateral upper extremity supported;During functional activity Standing balance-Leahy Scale: Fair                     ADL Overall ADL's  : Needs assistance/impaired Eating/Feeding: Set up;Sitting   Grooming: Set up;Sitting                   Toilet Transfer: Minimal assistance;Cueing for safety;BSC   Toileting- Clothing Manipulation and Hygiene: Minimal assistance;Sit to/from stand       Functional mobility during ADLs: Minimal assistance General ADL Comments: Pt does feel that [place] would be of benefit at this time.  Educated pt re : energy conservation technques she could use at home and gave her a hand out.  Pt is interested in the rollator walker so she can have a place to sit when she fatigues while walking.  Pt with swollen ankles today therefore not up to walking in room much.  Ice placed on ankles. Pt states this helps when she is at home.      Vision                     Perception     Praxis      Cognition   Behavior During Therapy: [facility] for tasks assessed/performed Overall Cognitive Status: Within Functional Limits for tasks assessed                       Extremity/Trunk Assessment               Exercises     Shoulder Instructions       General Comments  Pertinent Vitals/ Pain       Pain Assessment: 0-10 Pain Score: 3  Pain Location: B ankles (swelling) Pain Descriptors / Indicators: Discomfort Pain Intervention(s): Limited activity within patient's tolerance;Monitored during session;Repositioned  Home Living                                          Prior Functioning/Environment              Frequency Min 2X/week     Progress Toward Goals  OT Goals(current goals can now be found in the care plan section)  Progress towards OT goals: Progressing toward goals  Acute Rehab OT Goals Patient Stated Goal: feel better- its been so long OT Goal Formulation: With patient Time For Goal Achievement: 01/19/15 Potential to Achieve Goals: Good ADL Goals Pt Will Perform Grooming: with modified independence;standing Pt Will Perform Upper  Body Dressing: with modified independence;sitting Pt Will Perform Lower Body Dressing: with modified independence;sit to/from stand Pt Will Transfer to Toilet: ambulating;with modified independence;regular height toilet Pt Will Perform Toileting - Clothing Manipulation and hygiene: with modified independence;sit to/from stand Additional ADL Goal #1: Pt will verbalize energy conservation principles in relation to ADL activity and ways to save energy  at mod I level  Plan Discharge plan remains appropriate    Co-evaluation                 End of Session Equipment Utilized During Treatment: Rolling walker   Activity Tolerance Patient tolerated treatment well;Patient limited by pain   Patient Left in bed;with call bell/phone within reach   Nurse Communication Mobility status        Time: 9242-6834 OT Time Calculation (min): 14 min  Charges: OT General Charges $OT Visit: 1 Procedure OT Treatments $Self Care/Home Management : 8-22 mins  Glenford Peers 01/07/2015, 12:55 PM  [phone]

## 2015-01-07 NOTE — Progress Notes (Signed)
ANTICOAGULATION CONSULT NOTE - Follow Up Consult  Pharmacy Consult for heparin Indication: new  DVT  Allergies  Allergen Reactions  . Actonel [Risedronate Sodium]     "made me choke"   . Fosamax [Alendronate]     "made me choke"  . Sulfa Antibiotics     Doesn't remember   . Tetracyclines & Related Rash    Patient Measurements: Height: 5\' 3"  (160 cm) Weight: 124 lb (56.246 kg) IBW/kg (Calculated) : 52.4 Heparin Dosing Weight: 56 kg  Vital Signs: Temp: 98.5 F (36.9 C) (04/26 0459) Temp Source: Oral (04/26 0459) BP: 126/62 mmHg (04/26 0459) Pulse Rate: 84 (04/26 0459)  Labs:  Recent Labs  01/05/15 0346  01/06/15 0442 01/06/15 1300 01/07/15 0445  HGB 8.7*  --  8.1*  --  7.8*  HCT 25.3*  --  23.5*  --  22.7*  PLT 44*  --  41*  --  48*  HEPARINUNFRC  --   < > 0.49 0.53 0.39  CREATININE 0.77  --  0.66  --  0.71  < > = values in this interval not displayed.  Estimated Creatinine Clearance: 53.4 mL/min (by C-G formula based on Cr of 0.71).   Medical History: Past Medical History  Diagnosis Date  . OSA (obstructive sleep apnea)   . Hiatal hernia   . IBS (irritable bowel syndrome)   . Fibrocystic breast disease   . Asthma   . Diverticulosis   . Adenomatous colon polyp 1994  . Internal hemorrhoids   . Melanoma     Facial  . Hyperlipidemia   . Hypertension   . GERD (gastroesophageal reflux disease)   . Osteoporosis   . Osteoarthritis   . Vitamin D deficiency   . Prediabetes   . Unspecified hypothyroidism   . Family history of ischemic heart disease   . Mantle cell lymphoma 10/18/2014   Assessment: 19 YOF with mantle cell lymphoma and pancytopenia currently on chemotherapy presented to the ED on 01/01/15 due to left eye pain and swelling, found to have periorbital/preseptal cellulitis. Pharmacy is familiar with her from current antibiotic consults. LE doppler performed 01/06/15 showed extensive acute DVT. Oncology recommending that patient remains on heparin  until discharge and then switch to new oral anticoagulant. Pharmacy has been consulted to dose heparin for DVT treatment.   Today, 01/07/2015:  Heparin level therapeutic is at 0.39 unit/mL with infusion at 1000 units/hr  Hgb decreased to 7.8. Platelets low, but relatively stable. No bleeding documented. Hem/Onc consult notes patient may need transfusion.  Renal function stable.  Goal of Therapy:  Heparin level 0.3-0.7 units/ml Monitor platelets by anticoagulation protocol: Yes   Plan:   Continue heparin drip at 1000 units/hr.  Check heparin level daily  Monitor CBC and signs and symptoms of bleeding.    Follow up hematology/oncoclogy recommendations.  Gloriajean Dell, PharmD Candidate

## 2015-01-08 ENCOUNTER — Ambulatory Visit: Payer: Medicare Other | Admitting: Pulmonary Disease

## 2015-01-08 ENCOUNTER — Ambulatory Visit: Payer: Medicare Other

## 2015-01-08 ENCOUNTER — Other Ambulatory Visit: Payer: Medicare Other

## 2015-01-08 DIAGNOSIS — D696 Thrombocytopenia, unspecified: Secondary | ICD-10-CM

## 2015-01-08 LAB — BASIC METABOLIC PANEL
ANION GAP: 8 (ref 5–15)
BUN: 8 mg/dL (ref 6–23)
CO2: 22 mmol/L (ref 19–32)
Calcium: 8.8 mg/dL (ref 8.4–10.5)
Chloride: 111 mmol/L (ref 96–112)
Creatinine, Ser: 0.69 mg/dL (ref 0.50–1.10)
GFR calc Af Amer: >90 mL/min (ref >90)
GFR, EST NON AFRICAN AMERICAN: 86 mL/min — AB (ref >90)
Glucose, Bld: 101 mg/dL — ABNORMAL HIGH (ref 70–99)
POTASSIUM: 3.5 mmol/L (ref 3.5–5.1)
SODIUM: 141 mmol/L (ref 135–145)

## 2015-01-08 LAB — CBC WITH DIFFERENTIAL/PLATELET
Basophils Absolute: 0 10*3/uL (ref 0.0–0.1)
Basophils Relative: 0 % (ref 0–1)
Eosinophils Absolute: 0 10*3/uL (ref 0.0–0.7)
Eosinophils Relative: 0 % (ref 0–5)
HCT: 22.7 % — ABNORMAL LOW (ref 36.0–46.0)
Hemoglobin: 7.7 g/dL — ABNORMAL LOW (ref 12.0–15.0)
LYMPHS ABS: 0.7 10*3/uL (ref 0.7–4.0)
LYMPHS PCT: 25 % (ref 12–46)
MCH: 27.9 pg (ref 26.0–34.0)
MCHC: 33.9 g/dL (ref 30.0–36.0)
MCV: 82.2 fL (ref 78.0–100.0)
Monocytes Absolute: 0.9 10*3/uL (ref 0.1–1.0)
Monocytes Relative: 32 % — ABNORMAL HIGH (ref 3–12)
Neutro Abs: 1.2 10*3/uL — ABNORMAL LOW (ref 1.7–7.7)
Neutrophils Relative %: 43 % (ref 43–77)
Platelets: 77 10*3/uL — ABNORMAL LOW (ref 150–400)
RBC: 2.76 MIL/uL — ABNORMAL LOW (ref 3.87–5.11)
RDW: 17.1 % — AB (ref 11.5–15.5)
WBC: 2.8 10*3/uL — ABNORMAL LOW (ref 4.0–10.5)

## 2015-01-08 LAB — VANCOMYCIN, TROUGH: VANCOMYCIN TR: 14.9 ug/mL (ref 10.0–20.0)

## 2015-01-08 LAB — HEPARIN LEVEL (UNFRACTIONATED): Heparin Unfractionated: 0.34 IU/mL (ref 0.30–0.70)

## 2015-01-08 MED ORDER — BIOTENE DRY MOUTH MT LIQD
15.0000 mL | Freq: Four times a day (QID) | OROMUCOSAL | Status: DC
  Administered 2015-01-08 – 2015-01-09 (×5): 15 mL via OROMUCOSAL

## 2015-01-08 NOTE — Progress Notes (Signed)
ANTIBIOTIC CONSULT NOTE - Follow Up  Pharmacy Consult for Vancomycin, ampicillin/sulbactam Indication: periorbital/preseptal cellulitis  Allergies  Allergen Reactions  . Actonel [Risedronate Sodium]     "made me choke"   . Fosamax [Alendronate]     "made me choke"  . Sulfa Antibiotics     Doesn't remember   . Tetracyclines & Related Rash    Patient Measurements: Height: 5\' 3"  (160 cm) Weight: 124 lb (56.246 kg) IBW/kg (Calculated) : 52.4 Adjusted Body Weight:   Vital Signs: Temp: 98.7 F (37.1 C) (04/27 0403) Temp Source: Oral (04/27 0403) BP: 132/72 mmHg (04/27 0403) Pulse Rate: 76 (04/27 0403) Intake/Output from previous day: 04/26 0701 - 04/27 0700 In: 920 [P.O.:120; IV Piggyback:800] Out: 2400 [Urine:2400] Intake/Output from this shift:    Labs:  Recent Labs  01/06/15 0442 01/07/15 0445 01/08/15 0532  WBC 0.3* 0.6* 2.8*  HGB 8.1* 7.8* 7.7*  PLT 41* 48* 77*  CREATININE 0.66 0.71 0.69   Estimated Creatinine Clearance: 53.4 mL/min (by C-G formula based on Cr of 0.69).  Recent Labs  01/06/15 0900 01/08/15 0840  VANCOTROUGH 7.2* 14.9     Microbiology: Recent Results (from the past 720 hour(s))  Culture, blood (routine x 2)     Status: None (Preliminary result)   Collection Time: 01/04/15 10:19 AM  Result Value Ref Range Status   Specimen Description BLOOD PORTA CATH  Final   Special Requests BOTTLES DRAWN AEROBIC AND ANAEROBIC 5 CC EACH  Final   Culture   Final           BLOOD CULTURE RECEIVED NO GROWTH TO DATE CULTURE WILL BE HELD FOR 5 DAYS BEFORE ISSUING A FINAL NEGATIVE REPORT Performed at [facility]    Report Status PENDING  Incomplete  Culture, blood (routine x 2)     Status: None (Preliminary result)   Collection Time: 01/04/15 10:35 AM  Result Value Ref Range Status   Specimen Description BLOOD LEFT ANTECUBITAL  Final   Special Requests BOTTLES DRAWN AEROBIC AND ANAEROBIC 5 CC EACH  Final   Culture   Final           BLOOD  CULTURE RECEIVED NO GROWTH TO DATE CULTURE WILL BE HELD FOR 5 DAYS BEFORE ISSUING A FINAL NEGATIVE REPORT Performed at [facility]    Report Status PENDING  Incomplete    Medical History: Past Medical History  Diagnosis Date  . OSA (obstructive sleep apnea)   . Hiatal hernia   . IBS (irritable bowel syndrome)   . Fibrocystic breast disease   . Asthma   . Diverticulosis   . Adenomatous colon polyp 1994  . Internal hemorrhoids   . Melanoma     Facial  . Hyperlipidemia   . Hypertension   . GERD (gastroesophageal reflux disease)   . Osteoporosis   . Osteoarthritis   . Vitamin D deficiency   . Prediabetes   . Unspecified hypothyroidism   . Family history of ischemic heart disease   . Mantle cell lymphoma 10/18/2014   Assessment: 41 YOF with a history of lymphoma getting chemotherapy (last 12/25/14) presented to the ED on 01/04/15 with eye pain/swelling. She was noted to have periorbital/preseptal cellulitis of the left eye. Pharmacy was consulted to dose Vancomycin and Unasyn on admission. First doses were given in ED. She remains neutropenic with undetectable ANC since admission. Note the patient takes Famvir, Levaquin, Diflucan PTA daily for prophylaxis per oncology. Patient was started on Granix on 01/04/15 and was stopped 01/08/15  due to neutrophil count >1,000.   4/23 Vancomycin>> 4/23 Unasyn>> 4/23 Fluconazole>> 4/23 Valacyclovir>>  4/23 BCx>>NGTD  Today 01/08/2015  is day #5 of Vancomycin and Unasyn for periorbital/preseptal cellulitis of left eye.   Vancomycin trough is therapeutic at 14.9 mcg/mL  Tmax: 99, afebrile  Renal: SCr 0.69 mg/dL, CrCl ~53 mL/min  CBC: WBC low at 2.8, but trending up  Goal of Therapy:  Vancomycin trough level 10-15 mcg/ml  Plan:   Continue Vancomycin to 1g IV q12h.  Continue Unasyn 3gm IV q6h.  Monitor renal function, trough levels, cultures and clinical course.  Gloriajean Dell, PharmD Candidate

## 2015-01-08 NOTE — Progress Notes (Signed)
ANTICOAGULATION CONSULT NOTE - Follow Up Consult  Pharmacy Consult for Heparin Indication: new DVT  Allergies  Allergen Reactions  . Actonel [Risedronate Sodium]     "made me choke"   . Fosamax [Alendronate]     "made me choke"  . Sulfa Antibiotics     Doesn't remember   . Tetracyclines & Related Rash    Patient Measurements: Height: 5\' 3"  (160 cm) Weight: 124 lb (56.246 kg) IBW/kg (Calculated) : 52.4 Heparin Dosing Weight: 56 kg  Vital Signs: Temp: 98.7 F (37.1 C) (04/27 0403) Temp Source: Oral (04/27 0403) BP: 132/72 mmHg (04/27 0403) Pulse Rate: 76 (04/27 0403)  Labs:  Recent Labs  01/06/15 0442 01/06/15 1300 01/07/15 0445 01/08/15 0532  HGB 8.1*  --  7.8* 7.7*  HCT 23.5*  --  22.7* 22.7*  PLT 41*  --  48* 77*  HEPARINUNFRC 0.49 0.53 0.39 0.34  CREATININE 0.66  --  0.71 0.69    Estimated Creatinine Clearance: 53.4 mL/min (by C-G formula based on Cr of 0.69).   Medical History: Past Medical History  Diagnosis Date  . OSA (obstructive sleep apnea)   . Hiatal hernia   . IBS (irritable bowel syndrome)   . Fibrocystic breast disease   . Asthma   . Diverticulosis   . Adenomatous colon polyp 1994  . Internal hemorrhoids   . Melanoma     Facial  . Hyperlipidemia   . Hypertension   . GERD (gastroesophageal reflux disease)   . Osteoporosis   . Osteoarthritis   . Vitamin D deficiency   . Prediabetes   . Unspecified hypothyroidism   . Family history of ischemic heart disease   . Mantle cell lymphoma 10/18/2014   Assessment: 24 YOF with mantle cell lymphoma and pancytopenia currently on chemotherapy presented to the ED on 01/01/15 due to left eye pain and swelling, found to have periorbital/preseptal cellulitis. Pharmacy is familiar with her from current antibiotic consults. LE doppler performed 01/06/15 showed extensive acute DVT. Oncology recommending that patient remains on heparin until discharge and then switch to Xarelto. Pharmacy has been consulted to  dose heparin for DVT treatment.   Today, 01/08/2015:  Heparin level remains therapeutic at 0.34 unit/mL with infusion at 1000 units/hr  Hgb decreased to 7.7. Platelets low, but increasing. No bleeding documented.   Renal function stable, SCr 0.69 mg/dL and CrCl ~53 mL/min  Goal of Therapy:  Heparin level 0.3-0.7 units/ml Monitor platelets by anticoagulation protocol: Yes   Plan:   Continue heparin drip at 1000 units/hr.  Check heparin level daily  Monitor CBC and signs and symptoms of bleeding.  Gloriajean Dell, PharmD Candidate

## 2015-01-08 NOTE — Progress Notes (Signed)
Shannon Parsons white cell count is coming up nicely. It is now 2.6. She is no longer neutropenic area did  I would keep her on Neupogen until her white cell count is above 3.  Her periorbital cellulitis looks better. I think some ice packs will continue to help with this.  She does have a hematoma on the buccal mucosa on the left side of her mouth. I'm sure this is from thrombocytopenia and being on heparin. I will try make sure she rinses with Biotene.  She continues on heparin for the thrombus in the right leg. Again, I would keep her on heparin until discharge. I then switch her over to Xarelto. I would make sure that she is able to afford Xarelto.  She is out of bed. The bed is a uncomfortable for her.  Her appetite is doing okay.  She is not having any issues going to the bathroom.  She continues on IV antibiotics.  On her physical exam, her vital signs are all stable. Blood pressure 132/72. Temperature 98.7. Pulse is 76. Head and neck exam shows the swelling and slight discoloration about the left eye. Her pupils react. She has good extraocular muscle movement. She has had the small hematoma on the buccal mucosa on the left side of her mouth. There is no adenopathy d in the neck. Lungs are clear. Cardiac exam regular rate and rhythm. Abdomen soft. Extremities shows no swelling of the right leg. No venous cords noted. Skin exam shows no ecchymoses or petechia. Neurological exam is nonfocal.  She is quite anemic. However, I believe that her blood count is trending upward. Her plastic as much better. Her white cell count much better. She really is not that symptomatic with the anemia.  I think that 1-2 more days in the hospital should be appropriate for her. She will need to be transitioned over to oral antibiotics.  I appreciate the great care that she is getting up on 4 E.!!!   [name].  Hebrews 12:12

## 2015-01-08 NOTE — Progress Notes (Addendum)
Patient ID: Shannon Parsons, female   DOB: 1941/09/04, 74 y.o.   MRN: [id number] [facility] PROGRESS NOTE  Shannon Parsons QMG:[id number] DOB: 06-14-1941 DOA: 01/04/2015 PCP: Alesia Richards, MD  Oncology: Dr. Lattie Haw   Brief narrative:    74 year old female with a past medical history of mantle cell lymphoma on chemotherapy (last chemotherapy 4/13/2016with rituxan, velcade, doxorubicin and cyclophosphamide) who presented to [facility] with pain in the left eye that started few days prior to this admission. She was found to have left eye cellulitis in addition to neutropenia and was started on Unasyn and vancomycin.   Hospital course is complicated with finding of new acute right LE DVT and she was started on heparin drip with the idea to switch to xarelto on discharge.   Barrier to discharge: hoping to get her platelet and Hgb at least above 90 and 8 respectively. Once her counts little better she can be discharged to home, possible in next 1-2 days.   Assessment/Plan:    Principal Problem:   Preseptal cellulitis of left eye  - No evidence of an abscess on CT of the orbits. - She was started on Unasyn and vancomycin on admission - Cellulitis seems to be better.   Active Problems: Febrile neutropenia due to preseptal left eyes cellulitis  / Chemotherapy induced neutropenia - Pt with WBC count low 0.3. Neutropenia likely due to sequela of chemotherapy as well as left eye cellulitis. - She was started on Unasyn and Vancomycin for cellulitis.  - Also started on filgrastim on admission which helped recover WBC count to 2.8. Will stop filgrastim today. - Cellulitis improved.     Hypothyroidism - Continue synthroid 50 mcg daily     Mantle cell lymphoma - Under the care of Dr. Marin Olp. Appreciate oncology following and recomendations.  - Last infusion 4/13: rituxan, cytoxan, velcade, doxorubicin - Continue fluconazole, valacyclovir for prophylaxis       Thrombocytopenia /  anemia of chronic disease - Secondary to history of malignancy and sequela of chemotherapy - Platelet count is improving, 48 --> 77.  - Hgb 7.8, 7.7, stable. Will transfuse if Hgb less than 7.    Leg DVT (deep venous thromboembolism), acute - Pt currently on heparin drip but will change to Xarelto on discharge per oncology recommendations  - RLE doppler positive for DVT iinvolving the right saphenofemoral junction, common femoral, femoral, profunda femoral, popliteal, posterior tibial, and peroneal veins.     Moderate protein calorie malnutrition - In the context of chronic illness  - Nutrition consulted - Continue Ensure supplementation, megace    DVT Prophylaxis - On heparin drip; change to xarelto on discharge   Code Status: Full.  Family Communication:  plan of care discussed with the patient Disposition Plan: Home once she feels little better. She is tired today and her CBC still not at ideal range; hopefully in next 24-48 hours if her WBC count, platelet and Hgb stable she may be able to go home.  IV access:  Peripheral IV PAC  Procedures and diagnostic studies:    Ct Orbits W/cm 01/04/2015   No evidence of orbital abscess.  There is fluid in the left mastoid air cells. An inflammatory process is not excluded.    RLE doppler 01/05/2015 - The right lower extremity is positive for extensive acute deep vein thrombosis involving the right saphenofemoral junction, common femoral, femoral, profunda femoral, popliteal, posterior tibial, and peroneal veins. There is no obvious evidence of left lower extremity deep vein thrombosis  or bilateral Baker's cyst.  Medical Consultants:  Oncology, Dr. Lattie Haw  Other Consultants:  Nutrition   IAnti-Infectives:   Unasyn 01/04/2015 --> Vancomycin 01/04/2015 -->   Leisa Lenz, MD  [facility] Pager [phone]  Time spent in minutes: 25 minutes  If 7PM-7AM, please contact night-coverage [email or web address] Password  [email or web address] 01/08/2015, 2:01 PM   LOS: 4 days    HPI/Subjective: No acute overnight events. Patient reports feeling tired but better overall since admission. No nausea or vomiting.   Objective: Filed Vitals:   01/07/15 2205 01/07/15 2230 01/08/15 0403 01/08/15 1137  BP: 122/70  132/72   Pulse:  74 76   Temp:  97.7 F (36.5 C) 98.7 F (37.1 C)   TempSrc:  Oral Oral   Resp:  18 20   Height:      Weight:      SpO2:   98% 98%    Intake/Output Summary (Last 24 hours) at 01/08/15 1401 Last data filed at 01/08/15 0647  Gross per 24 hour  Intake    800 ml  Output   2400 ml  Net  -1600 ml    Exam:   General:  Pt is alert, follows commands appropriately, not in acute distress; cellulitis in occular area, left eye (per pt improved)  Cardiovascular: Regular rate and rhythm, S1/S2, no murmurs  Respiratory: Clear to auscultation bilaterally, no wheezing, no crackles, no rhonchi  Abdomen: Soft, non tender, non distended, bowel sounds present  Extremities: No edema, pulses DP and PT palpable bilaterally  Neuro: Grossly nonfocal  Data Reviewed: Basic Metabolic Panel:  Recent Labs Lab 01/04/15 1019 01/05/15 0346 01/06/15 0442 01/07/15 0445 01/08/15 0532  NA 134* 132* 135 136 141  K 3.7 3.7 3.2* 3.6 3.5  CL 106 107 109 113* 111  CO2 22 21 21 20 22   GLUCOSE 89 106* 123* 103* 101*  BUN 12 13 11 9 8   CREATININE 0.75 0.77 0.66 0.71 0.69  CALCIUM 8.6 8.1* 8.2* 8.2* 8.8   Liver Function Tests:  Recent Labs Lab 01/04/15 1019 01/05/15 0346 01/07/15 0445  AST 22 17 16   ALT 13 13 12   ALKPHOS 31* 27* 25*  BILITOT 0.7 0.7 0.5  PROT 5.6* 5.3* 5.2*  ALBUMIN 3.2* 2.9* 2.8*   No results for input(s): LIPASE, AMYLASE in the last 168 hours. No results for input(s): AMMONIA in the last 168 hours. CBC:  Recent Labs Lab 01/04/15 1019 01/05/15 0346 01/06/15 0442 01/07/15 0445 01/08/15 0532  WBC 0.2* 0.3* 0.3* 0.6* 2.8*  NEUTROABS 0.0* 0.0* 0.0* 0.0* 1.2*  HGB 9.2* 8.7*  8.1* 7.8* 7.7*  HCT 27.2* 25.3* 23.5* 22.7* 22.7*  MCV 81.7 81.9 81.6 81.4 82.2  PLT 48* 44* 41* 48* 77*   Cardiac Enzymes: No results for input(s): CKTOTAL, CKMB, CKMBINDEX, TROPONINI in the last 168 hours. BNP: Invalid input(s): POCBNP CBG: No results for input(s): GLUCAP in the last 168 hours.  Culture, blood (routine x 2)     Status: None (Preliminary result)   Collection Time: 01/04/15 10:19 AM  Result Value Ref Range Status   Specimen Description BLOOD PORTA CATH  Final   Special Requests BOTTLES DRAWN AEROBIC AND ANAEROBIC 5 CC EACH  Final   Culture   Final           BLOOD CULTURE RECEIVED NO GROWTH TO DATE     Report Status PENDING  Incomplete  Culture, blood (routine x 2)     Status: None (Preliminary result)   Collection Time: 01/04/15  10:35 AM  Result Value Ref Range Status   Specimen Description BLOOD LEFT ANTECUBITAL  Final   Special Requests BOTTLES DRAWN AEROBIC AND ANAEROBIC 5 CC EACH  Final   Culture   Final           BLOOD CULTURE RECEIVED NO GROWTH TO DATE     Report Status PENDING  Incomplete     Scheduled Meds: . ampicillin-sulbactam (UNASYN) IV  3 g Intravenous Q6H  . budesonide (PULMICORT) nebulizer solution  0.25 mg Nebulization BID  . docusate sodium  100 mg Oral BID  . famotidine  20 mg Oral QHS  . feeding supplement (ENSURE ENLIVE)  237 mL Oral BID BM  . fluconazole  100 mg Oral Daily  . levothyroxine  50 mcg Oral QAC breakfast  . megestrol  400 mg Oral BID  . sucralfate  1 g Oral TID WC & HS  . valACYclovir  1,000 mg Oral Daily  . vancomycin  1,000 mg Intravenous Q12H   Continuous Infusions: . heparin 1,000 Units/hr (01/07/15 1132)

## 2015-01-09 LAB — CBC WITH DIFFERENTIAL/PLATELET
Basophils Absolute: 0 10*3/uL (ref 0.0–0.1)
Basophils Relative: 0 % (ref 0–1)
EOS ABS: 0 10*3/uL (ref 0.0–0.7)
Eosinophils Relative: 0 % (ref 0–5)
HEMATOCRIT: 23.5 % — AB (ref 36.0–46.0)
Hemoglobin: 7.7 g/dL — ABNORMAL LOW (ref 12.0–15.0)
LYMPHS PCT: 9 % — AB (ref 12–46)
Lymphs Abs: 0.8 10*3/uL (ref 0.7–4.0)
MCH: 27 pg (ref 26.0–34.0)
MCHC: 32.8 g/dL (ref 30.0–36.0)
MCV: 82.5 fL (ref 78.0–100.0)
MONO ABS: 2.1 10*3/uL — AB (ref 0.1–1.0)
Monocytes Relative: 25 % — ABNORMAL HIGH (ref 3–12)
Neutro Abs: 5.6 10*3/uL (ref 1.7–7.7)
Neutrophils Relative %: 66 % (ref 43–77)
PLATELETS: 87 10*3/uL — AB (ref 150–400)
RBC: 2.85 MIL/uL — ABNORMAL LOW (ref 3.87–5.11)
RDW: 17.4 % — ABNORMAL HIGH (ref 11.5–15.5)
WBC: 8.5 10*3/uL (ref 4.0–10.5)

## 2015-01-09 LAB — BASIC METABOLIC PANEL
Anion gap: 6 (ref 5–15)
BUN: 9 mg/dL (ref 6–23)
CHLORIDE: 110 mmol/L (ref 96–112)
CO2: 23 mmol/L (ref 19–32)
Calcium: 8.6 mg/dL (ref 8.4–10.5)
Creatinine, Ser: 0.73 mg/dL (ref 0.50–1.10)
GFR calc non Af Amer: 84 mL/min — ABNORMAL LOW (ref >90)
GLUCOSE: 104 mg/dL — AB (ref 70–99)
POTASSIUM: 3.6 mmol/L (ref 3.5–5.1)
Sodium: 139 mmol/L (ref 135–145)

## 2015-01-09 LAB — HEPARIN LEVEL (UNFRACTIONATED): HEPARIN UNFRACTIONATED: 0.33 [IU]/mL (ref 0.30–0.70)

## 2015-01-09 MED ORDER — RIVAROXABAN 15 MG PO TABS
15.0000 mg | ORAL_TABLET | Freq: Two times a day (BID) | ORAL | Status: DC
  Administered 2015-01-09 – 2015-01-10 (×3): 15 mg via ORAL
  Filled 2015-01-09 (×4): qty 1

## 2015-01-09 NOTE — Progress Notes (Signed)
NUTRITION FOLLOW-UP   Pt meets criteria for NON-SEVERE (moderate) MALNUTRITION in the context of chronic illness, cancer as evidenced by 14 lb weight loss (10% body weight) in 5 months, moderate bilateral ankle edema.  DOCUMENTATION CODES Per approved criteria  -Non-severe (moderate) malnutrition in the context of chronic illness   INTERVENTION: - Continue Regular diet - Continue Ensure Enlive po BID, each supplement provides 350 kcal and 20 grams of protein - RD to continue to monitor for needs  NUTRITION DIAGNOSIS: Increased protein-energy needs related to catabolic illness, cancer as evidenced by pt with mantle cell lymphoma currently on chemotherapy. -ongoing  Goal: Pt to meet >/= 90% estimated nutrition needs -met on average  Monitor:  Meal and supplement intakes, weight trends, labs, I/O's  74 y.o. female  Admitting Dx: Preseptal cellulitis of left eye  ASSESSMENT: Pt is a 74 y.o. female with hx of asthma, hypothyroidism, and mantle cell lymphoma. Her last chemotherapy treatment was 4/13 and treatment scheduled for 4/20 was canceled due to low WBC count. Pt with L eye cellulitis on admission.  Flow sheet review indicates the following intakes: 75% breakfast 4/25 100% breakfast 4/26 25% breakfast, 0% lunch, and 40% dinner 4/28  Pt reports she ate most of breakfast today which consisted of eggs, grits, bacon, and fruit cup. She states she has been having a good appetite on average without issues. Pt reports she likes the Ensure and has been drinking that between meals also.  Meeting needs on average. Labs and medications reviewed.  Height: Ht Readings from Last 1 Encounters:  01/04/15 _0  (1.6 m)    Weight: Wt Readings from Last 1 Encounters:  01/04/15 124 lb (56.246 kg)    Ideal Body Weight: 115 lbs (52.27 kg)  % Ideal Body Weight: 108%  Wt Readings from Last 10 Encounters:  01/04/15 124 lb (56.246 kg)  12/25/14 124 lb (56.246 kg)  12/16/14 126 lb  (57.153 kg)  12/04/14 125 lb (56.7 kg)  11/13/14 127 lb (57.607 kg)  10/23/14 130 lb (58.968 kg)  10/10/14 156 lb (70.761 kg)  10/09/14 131 lb (59.421 kg)  09/18/14 135 lb (61.236 kg)  09/10/14 133 lb (60.328 kg)    Usual Body Weight: 138 lbs (62.73 kg)  % Usual Body Weight: 90%  BMI:  Body mass index is 21.97 kg/(m^2).  Estimated Nutritional Needs: Kcal: 1700-1900 Protein: 70-90 grams Fluid: 2L/day  Skin: WDL  Diet Order: Diet regular Room service appropriate?: Yes; Fluid consistency:: Thin    Intake/Output Summary (Last 24 hours) at 01/09/15 1122 Last data filed at 01/08/15 1700  Gross per 24 hour  Intake    480 ml  Output      0 ml  Net    480 ml    Last BM: no documentation since admission  Labs:   Recent Labs Lab 01/07/15 0445 01/08/15 0532 01/09/15 0435  NA 136 141 139  K 3.6 3.5 3.6  CL 113* 111 110  CO2 _1 BUN _2 CREATININE 0.71 0.69 0.73  CALCIUM 8.2* 8.8 8.6  GLUCOSE 103* 101* 104*    CBG (last 3)  No results for input(s): GLUCAP in the last 72 hours.  Scheduled Meds: . ampicillin-sulbactam (UNASYN) IV  3 g Intravenous Q6H  . antiseptic oral rinse  15 mL Mouth Rinse QID  . budesonide (PULMICORT) nebulizer solution  0.25 mg Nebulization BID  . docusate sodium  100 mg Oral BID  . famotidine  20 mg Oral QHS  .  feeding supplement (ENSURE ENLIVE)  237 mL Oral BID BM  . fluconazole  100 mg Oral Daily  . levothyroxine  50 mcg Oral QAC breakfast  . Rivaroxaban  15 mg Oral BID WC  . sucralfate  1 g Oral TID WC & HS  . valACYclovir  1,000 mg Oral Daily  . vancomycin  1,000 mg Intravenous Q12H    Continuous Infusions:    Past Medical History  Diagnosis Date  . OSA (obstructive sleep apnea)   . Hiatal hernia   . IBS (irritable bowel syndrome)   . Fibrocystic breast disease   . Asthma   . Diverticulosis   . Adenomatous colon polyp 1994  . Internal hemorrhoids   . Melanoma     Facial  . Hyperlipidemia   . Hypertension    . GERD (gastroesophageal reflux disease)   . Osteoporosis   . Osteoarthritis   . Vitamin D deficiency   . Prediabetes   . Unspecified hypothyroidism   . Family history of ischemic heart disease   . Mantle cell lymphoma 10/18/2014    Past Surgical History  Procedure Laterality Date  . Appendectomy    . Tonsillectomy    . Bunionectomy    . Knee arthroscopy    . Umbilical hernia repair      Jarome Matin, RD, LDN Inpatient Clinical Dietitian Pager # [phone] After hours/weekend pager # [phone]

## 2015-01-09 NOTE — Progress Notes (Signed)
PHYSICAL THERAPY  Pt is amb with nursing staff in the hallway at lib.    Rica Koyanagi  PTA [facility]  Acute  Rehab Pager      [phone]

## 2015-01-09 NOTE — Discharge Instructions (Signed)
Information on my medicine - XARELTO (rivaroxaban)  This medication education was reviewed with me or my healthcare representative as part of my discharge preparation.  The pharmacist that spoke with me during my hospital stay was:  Gloriajean Dell, PharmD Candidate  WHY WAS [place]? Xarelto was prescribed to treat blood clots that may have been found in the veins of your legs (deep vein thrombosis) or in your lungs (pulmonary embolism) and to reduce the risk of them occurring again.  What do you need to know about Xarelto? The starting dose is one 15 mg tablet taken TWICE daily with food for the FIRST 21 DAYS then on (enter date)  01/30/2015  the dose is changed to one 20 mg tablet taken ONCE A DAY with your evening meal.  DO NOT stop taking Xarelto without talking to the health care provider who prescribed the medication.  Refill your prescription for 20 mg tablets before you run out.  After discharge, you should have regular check-up appointments with your healthcare provider that is prescribing your Xarelto.  In the future your dose may need to be changed if your kidney function changes by a significant amount.  What do you do if you miss a dose? If you are taking Xarelto TWICE DAILY and you miss a dose, take it as soon as you remember. You may take two 15 mg tablets (total 30 mg) at the same time then resume your regularly scheduled 15 mg twice daily the next day.  If you are taking Xarelto ONCE DAILY and you miss a dose, take it as soon as you remember on the same day then continue your regularly scheduled once daily regimen the next day. Do not take two doses of Xarelto at the same time.   Important Safety Information Xarelto is a blood thinner medicine that can cause bleeding. You should call your healthcare provider right away if you experience any of the following: ? Bleeding from an injury or your nose that does not stop. ? Unusual colored urine (red or dark  brown) or unusual colored stools (red or black). ? Unusual bruising for unknown reasons. ? A serious fall or if you hit your head (even if there is no bleeding).  Some medicines may interact with Xarelto and might increase your risk of bleeding while on Xarelto. To help avoid this, consult your healthcare provider or pharmacist prior to using any new prescription or non-prescription medications, including herbals, vitamins, non-steroidal anti-inflammatory drugs (NSAIDs) and supplements.  This website has more information on Xarelto: [email or web address]

## 2015-01-09 NOTE — Progress Notes (Signed)
Patient ID: Shannon Parsons DOBROWOLSKI, female   DOB: 01-30-1941, 74 y.o.   MRN: [id number] [facility] PROGRESS NOTE  CLEON THOMA VPX:[id number] DOB: [birth year] DOA: 01/04/2015 PCP: Alesia Richards, MD  Oncology: Dr. Lattie Haw   Brief narrative:    74 year old female with a past medical history of mantle cell lymphoma on chemotherapy (last chemotherapy 4/13/2016with rituxan, velcade, doxorubicin and cyclophosphamide) who presented to [facility] with pain in the left eye that started few days prior to this admission. She was found to have left eye cellulitis in addition to neutropenia and was started on Unasyn and Vancomycin.   Hospital course is complicated with finding of new acute right LE DVT and she was started on heparin drip.  Barrier to discharge: home tomorrow with xarelto. Repeat CBC tomorrow and if still stable ok for discharge 4/29.   Assessment/Plan:    Principal Problem:   Preseptal cellulitis of left eye  - No evidence of an abscess on CT of the orbits. - Continue Unasyn and vancomycin which was started on admission. Cellulitis improving.   Active Problems: Febrile neutropenia due to preseptal left eyes cellulitis  / Chemotherapy induced neutropenia - Neutropenia secondary to sequela of chemotherapy - WBC count 0.3 on admission. Started on Unasyn and vanco on admission. Change to PO on discharge.  - Normalized after filgrastim, last dose given 01/07/2015.    Hypothyroidism - Continue synthroid 50 mcg daily     Mantle cell lymphoma - Under the care of Dr. Marin Olp. - Last chemotherapy was 4/13: rituxan, cytoxan, velcade, doxorubicin - Continue fluconazole, valacyclovir for prophylaxis per oncology recommendations.       Thrombocytopenia / anemia of chronic disease - Secondary to history of malignancy and sequela of chemotherapy - Platelet count is improving, 48 --> 87. - Hemoglobin 7.7. No reports of bleeding.     Leg DVT (deep venous thromboembolism), acute - RLE  doppler positive for DVT iinvolving the right saphenofemoral junction, common femoral, femoral, profunda femoral, popliteal, posterior tibial, and peroneal veins.  - On heparin gtt but change to xarelto today.     Moderate protein calorie malnutrition - In the context of chronic illness  - Continue Ensure supplementation, megace - Seen by dietician.    DVT Prophylaxis - Xarelto from today; was on heparin gtt    Code Status: Full.  Family Communication:  plan of care discussed with the patient Disposition Plan: Home in next 24 hours.   IV access:  Peripheral IV PAC  Procedures and diagnostic studies:    Ct Orbits W/cm 01/04/2015   No evidence of orbital abscess.  There is fluid in the left mastoid air cells. An inflammatory process is not excluded.    RLE doppler 01/05/2015 - The right lower extremity is positive for extensive acute deep vein thrombosis involving the right saphenofemoral junction, common femoral, femoral, profunda femoral, popliteal, posterior tibial, and peroneal veins. There is no obvious evidence of left lower extremity deep vein thrombosis or bilateral Baker's cyst.  Medical Consultants:  Oncology, Dr. Lattie Haw  Other Consultants:  Nutrition   IAnti-Infectives:   Unasyn 01/04/2015 --> Vancomycin 01/04/2015 -->   Leisa Lenz, MD  [facility] Pager [phone]  Time spent in minutes: 15 minutes  If 7PM-7AM, please contact night-coverage [email or web address] Password [facility] 01/09/2015, 12:52 PM   LOS: 5 days    HPI/Subjective: No acute overnight events. Patient reports feeling better.   Objective: Filed Vitals:   01/08/15 2004 01/08/15 2226 01/09/15 0442 01/09/15 0857  BP:  120/68  120/70   Pulse:  81 75   Temp:  98.9 F (37.2 C) 99.1 F (37.3 C)   TempSrc:  Oral Oral   Resp:  20 20   Height:      Weight:      SpO2: 98% 98% 98% 98%    Intake/Output Summary (Last 24 hours) at 01/09/15 1252 Last data filed at 01/08/15 1700  Gross per 24  hour  Intake    480 ml  Output      0 ml  Net    480 ml    Exam:   General:  Pt is alert, cellulitis in occular area, left eye improved.   Cardiovascular: RRR, S1/S2 (+)  Respiratory: no rhonchi, no wheezing   Abdomen: non tender and non distended abd, (+) BS  Extremities: No leg swelling, pulses palpable  Neuro: Nonfocal  Data Reviewed: Basic Metabolic Panel:  Recent Labs Lab 01/05/15 0346 01/06/15 0442 01/07/15 0445 01/08/15 0532 01/09/15 0435  NA 132* 135 136 141 139  K 3.7 3.2* 3.6 3.5 3.6  CL 107 109 113* 111 110  CO2 21 21 20 22 23   GLUCOSE 106* 123* 103* 101* 104*  BUN 13 11 9 8 9   CREATININE 0.77 0.66 0.71 0.69 0.73  CALCIUM 8.1* 8.2* 8.2* 8.8 8.6   Liver Function Tests:  Recent Labs Lab 01/04/15 1019 01/05/15 0346 01/07/15 0445  AST 22 17 16   ALT 13 13 12   ALKPHOS 31* 27* 25*  BILITOT 0.7 0.7 0.5  PROT 5.6* 5.3* 5.2*  ALBUMIN 3.2* 2.9* 2.8*   No results for input(s): LIPASE, AMYLASE in the last 168 hours. No results for input(s): AMMONIA in the last 168 hours. CBC:  Recent Labs Lab 01/05/15 0346 01/06/15 0442 01/07/15 0445 01/08/15 0532 01/09/15 0435  WBC 0.3* 0.3* 0.6* 2.8* 8.5  NEUTROABS 0.0* 0.0* 0.0* 1.2* 5.6  HGB 8.7* 8.1* 7.8* 7.7* 7.7*  HCT 25.3* 23.5* 22.7* 22.7* 23.5*  MCV 81.9 81.6 81.4 82.2 82.5  PLT 44* 41* 48* 77* 87*   Cardiac Enzymes: No results for input(s): CKTOTAL, CKMB, CKMBINDEX, TROPONINI in the last 168 hours. BNP: Invalid input(s): POCBNP CBG: No results for input(s): GLUCAP in the last 168 hours.  Culture, blood (routine x 2)     Status: None (Preliminary result)   Collection Time: 01/04/15 10:19 AM  Result Value Ref Range Status   Specimen Description BLOOD PORTA CATH  Final   Culture   Final           BLOOD CULTURE RECEIVED NO GROWTH TO DATE     Report Status PENDING  Incomplete  Culture, blood (routine x 2)     Status: None (Preliminary result)   Collection Time: 01/04/15 10:35 AM  Result  Value Ref Range Status   Specimen Description BLOOD LEFT ANTECUBITAL  Final   Culture   Final           BLOOD CULTURE RECEIVED NO GROWTH TO DATE     Report Status PENDING  Incomplete     Scheduled Meds: . ampicillin-sulbactam (UNASYN) IV  3 g Intravenous Q6H  . budesonide (PULMICORT) nebulizer solution  0.25 mg Nebulization BID  . docusate sodium  100 mg Oral BID  . famotidine  20 mg Oral QHS  . feeding supplement (ENSURE ENLIVE)  237 mL Oral BID BM  . fluconazole  100 mg Oral Daily  . levothyroxine  50 mcg Oral QAC breakfast  . megestrol  400 mg Oral BID  . sucralfate  1 g Oral TID WC & HS  . valACYclovir  1,000 mg Oral Daily  . vancomycin  1,000 mg Intravenous Q12H

## 2015-01-09 NOTE — Progress Notes (Signed)
[name] continues to improve. Her white cell count is now 8500. She can stop the Neupogen.  I think we can get her onto Xarelto. There is no right leg swelling. It seems like she only will have to pay $30 for the Xarelto.  She continues on IV antibiotics for the left periorbital cellulitis. This is improving nicely. There is not as much swelling. There is not as much discoloration.  She is eating okay.  I will stop the Megace as this might be a contributor to the thromboembolic event with her right leg.  She is ambulating. She is not having issues with diarrhea. There is no bleeding.  I will defer to the hospitalist for oral antibiotic therapy for the cellulitis.  Her vital signs all are stable. Temperature is 99.1. Pulse 75. Blood pressure 120/70. Head and neck exam shows some swelling and slight discoloration about the left eye. This continues to improve. Right eye is unremarkable. There is the hematoma on the left buccal mucosa which appears stable. Lungs are clear. Cardiac exam regular rate and rhythm with no murmurs, rubs or bruits. Abdomen is soft. She has good bowel sounds. There is no fluid wave. There is no palpable liver or spleen tip. Extremities shows no venous cord in the right leg. There is no swelling in the right leg. Skin exam shows no rashes, ecchymoses or petechia outside of that associated with the left eye.  With her labs, her hemoglobin is 7.7. This is holding pretty stable. Her platelet count is 87K. Her potassium was 3.6.  I think that she be discharged on Friday. I would like to make sure that she does okay with the oral Xarelto and with the oral antibiotics. However, if she wants to go home today, I certainly would not be adverse to this.  I very much appreciate the outstanding care that she is getting from all the staff and the hospitalist up on 4 E!!  [name].  [place]illipians 4:6

## 2015-01-09 NOTE — Progress Notes (Signed)
Occupational Therapy Treatment Patient Details Name: Shannon Parsons MRN: [id number] DOB: [birth year] Today's Date: 01/09/2015    History of present illness Shannon Parsons is a 74 y.o. female with a past medical history of mantle cell lymphoma on chemotherapy,  history of hypothyroidism, history of asthma who was in her usual state of health until 01/04/15  yesterday when she started noticing pain in the left eye. noted to have cellulitis. Also found to have R LE DVT,   OT comments  Pt up in room to the bathroom without assistive device for OT session but did push IV pole. Overall at supervision level and educated further on energy conservation techniques. Retrieved handout for pt from drawer and pt states she will look it over while in the recliner. Pt would like a 3in1 and would benefit from its use next to bed at night due to urgency to void.  -  Follow Up Recommendations  Home health OT;Supervision/Assistance - 24 hour    Equipment Recommendations  3 in 1 bedside comode (pt states she will sponge bathe for awhile)    Recommendations for Other Services      Precautions / Restrictions Precautions Precautions: Fall Precaution Comments: R DVT, dx after PT ambulated., neutropenic Restrictions Weight Bearing Restrictions: No       Mobility Bed Mobility               General bed mobility comments: at EOB when OT arrived.  Transfers Overall transfer level: Needs assistance Equipment used: None Transfers: Sit to/from Stand Sit to Stand: Supervision              Balance                                   ADL       Grooming: Wash/dry hands;Standing;Supervision/safety               Lower Body Dressing: Supervision/safety;Sit to/from stand   Toilet Transfer: Supervision/safety;Ambulation Toilet Transfer Details (indicate cue type and reason): push IV pole Toileting- Clothing Manipulation and Hygiene: Supervision/safety;Sit to/from stand          General ADL Comments: Reviewed energy conservation strategies and retrieved handout from pt drawer and pt requesting to look at handout in recliner. Encouraged her to read and apply strategies as able. Discussed use of 3in1 as BSC at night as pt states she has urgency to get to the bathroom when she wakes up in the night and explained for safety, BSC would be beneficial. Explained use over toilet also. Pt moving around well, pushing own IV pole. Pt agreeable to let [facility] assess for safety. She states husband purchased her a 4 wheel walker.       Vision                     Perception     Praxis      Cognition   Behavior During Therapy: [facility] for tasks assessed/performed Overall Cognitive Status: Within Functional Limits for tasks assessed                       Extremity/Trunk Assessment               Exercises     Shoulder Instructions       General Comments      Pertinent Vitals/ Pain       Pain Assessment:  No/denies pain  Home Living                                          Prior Functioning/Environment              Frequency Min 2X/week     Progress Toward Goals  OT Goals(current goals can now be found in the care plan section)  Progress towards OT goals: Progressing toward goals     Plan Discharge plan remains appropriate    Co-evaluation                 End of Session     Activity Tolerance Patient tolerated treatment well   Patient Left in chair;with call bell/phone within reach   Nurse Communication          Time: 8251-8984 OT Time Calculation (min): 15 min  Charges: OT General Charges $OT Visit: 1 Procedure OT Treatments $Therapeutic Activity: 8-22 mins  Jules Schick  [phone] 01/09/2015, 9:50 AM

## 2015-01-09 NOTE — Progress Notes (Signed)
ANTICOAGULATION CONSULT NOTE - [place] for Xarelto Indication: DVT  Allergies  Allergen Reactions  . Actonel [Risedronate Sodium]     "made me choke"   . Fosamax [Alendronate]     "made me choke"  . Sulfa Antibiotics     Doesn't remember   . Tetracyclines & Related Rash    Patient Measurements: Height: 5\' 3"  (160 cm) Weight: 124 lb (56.246 kg) IBW/kg (Calculated) : 52.4 Heparin Dosing Weight: 56 kg  Vital Signs: Temp: 99.1 F (37.3 C) (04/28 0442) Temp Source: Oral (04/28 0442) BP: 120/70 mmHg (04/28 0442) Pulse Rate: 75 (04/28 0442)  Labs:  Recent Labs  01/07/15 0445 01/08/15 0532 01/09/15 0435  HGB 7.8* 7.7* 7.7*  HCT 22.7* 22.7* 23.5*  PLT 48* 77* 87*  HEPARINUNFRC 0.39 0.34 0.33  CREATININE 0.71 0.69 0.73    Estimated Creatinine Clearance: 53.4 mL/min (by C-G formula based on Cr of 0.73).   Medical History: Past Medical History  Diagnosis Date  . OSA (obstructive sleep apnea)   . Hiatal hernia   . IBS (irritable bowel syndrome)   . Fibrocystic breast disease   . Asthma   . Diverticulosis   . Adenomatous colon polyp 1994  . Internal hemorrhoids   . Melanoma     Facial  . Hyperlipidemia   . Hypertension   . GERD (gastroesophageal reflux disease)   . Osteoporosis   . Osteoarthritis   . Vitamin D deficiency   . Prediabetes   . Unspecified hypothyroidism   . Family history of ischemic heart disease   . Mantle cell lymphoma 10/18/2014   Assessment: 31 YOF with mantle cell lymphoma and pancytopenia currently on chemotherapy (last 12/25/14) presented to the ED on 01/01/15 due to left eye pain and swelling, found to have periorbital/preseptal cellulitis without orbital involvement. Pharmacy is familiar with her from current antibiotic consults. LE doppler performed 01/06/15 showed extensive acute DVT. Heparin has been discontinued today. The patient is being transitioned to Xarelto. Pharmacy has been consulted to dose Xarelto for  DVT treatment.  Today, 01/09/2015:  CBC: Hgb stable at 7.7. Platelets low at 87, but increasing.    Renal function stable, SCr 0.73 mg/dL and CrCl ~53 mL/min  NOTE: during counseling about Xarelto yesterday patient mentioned she has been experiencing nose bleeds a few days prior to admission that has continued since she has been admitted. She also stated that it has not increased in severity while on heparin.   No other bleeding noted  Goal of Therapy:  Appropriate anticoagulation dosing per patient specific parameters   Plan:   Start Xarelto 15mg  tablets PO twice daily with meals for 21 days, followed by 20mg  tablets PO once daily with a meal   NOTE: Start 20mg  tablets on 01/30/15  Recommend discharging patient on the Xarelto starter pack due to convenience for the patient. Note the patient also qualifies for the discount card for the starter pack.   Monitor CBC and signs and symptoms of bleeding.    Gloriajean Dell, PharmD Candidate

## 2015-01-10 LAB — CBC WITH DIFFERENTIAL/PLATELET
Basophils Absolute: 0 10*3/uL (ref 0.0–0.1)
Basophils Relative: 0 % (ref 0–1)
EOS ABS: 0 10*3/uL (ref 0.0–0.7)
Eosinophils Relative: 0 % (ref 0–5)
HCT: 23.4 % — ABNORMAL LOW (ref 36.0–46.0)
Hemoglobin: 7.7 g/dL — ABNORMAL LOW (ref 12.0–15.0)
Lymphocytes Relative: 10 % — ABNORMAL LOW (ref 12–46)
Lymphs Abs: 0.8 10*3/uL (ref 0.7–4.0)
MCH: 27.5 pg (ref 26.0–34.0)
MCHC: 32.9 g/dL (ref 30.0–36.0)
MCV: 83.6 fL (ref 78.0–100.0)
MONO ABS: 1.7 10*3/uL — AB (ref 0.1–1.0)
Monocytes Relative: 23 % — ABNORMAL HIGH (ref 3–12)
Neutro Abs: 5 10*3/uL (ref 1.7–7.7)
Neutrophils Relative %: 67 % (ref 43–77)
PLATELETS: 108 10*3/uL — AB (ref 150–400)
RBC: 2.8 MIL/uL — AB (ref 3.87–5.11)
RDW: 17.9 % — AB (ref 11.5–15.5)
WBC: 7.5 10*3/uL (ref 4.0–10.5)

## 2015-01-10 LAB — CULTURE, BLOOD (ROUTINE X 2)
Culture: NO GROWTH
Culture: NO GROWTH

## 2015-01-10 MED ORDER — RIVAROXABAN (XARELTO) VTE STARTER PACK (15 & 20 MG): ORAL_TABLET | ORAL | Status: DC

## 2015-01-10 MED ORDER — MUPIROCIN 2 % EX OINT
TOPICAL_OINTMENT | Freq: Three times a day (TID) | CUTANEOUS | Status: DC
Start: 2015-01-10 — End: 2015-01-10
  Administered 2015-01-10: 1 via TOPICAL
  Filled 2015-01-10: qty 22

## 2015-01-10 MED ORDER — HEPARIN SOD (PORK) LOCK FLUSH 100 UNIT/ML IV SOLN
500.0000 [IU] | INTRAVENOUS | Status: AC | PRN
  Administered 2015-01-10: 500 [IU]

## 2015-01-10 NOTE — Progress Notes (Signed)
[name] looks good this morning. Her left eye continues to improve. I would probably put some Bactroban ointment on the upper eyelid of the left eye. He can tell that she has a eschar on the lateral aspect of the left upper eyelid.  She is afebrile. Her white cell count is holding steady. She now is on Xarelto. Per she's walking. She's not having any problems with bleeding. Her appetite is doing well.  On her physical exam, her vital signs are all stable. Her temperature 97.9. Blood pressure 130/68. On her exam, there is still some slight swelling of the left upper eyelid.. There is very minimal discoloration. She has good extraocular muscle movement. Her hematoma is resolving on the left buccal mucosa. Lungs are clear. Cardiac exam regular rate and rhythm with a normal S1 and S2. There are no murmurs, rubs or bruits. Extremities shows no swelling of the right leg. She has no tenderness to palpation.  Her hemoglobin is still 7.7. She is asymptomatic with this. I think this will improve over time.  From my point of view, she can go home. She's ready to go home. We had a good prayer session morning.  [name].  1 Corinthians 15:28

## 2015-01-10 NOTE — Discharge Summary (Signed)
Physician Discharge Summary  RONNESHA MESTER XHB:[id number] DOB: 1941/06/04 DOA: 01/04/2015  PCP: Alesia Richards, MD  Admit date: 01/04/2015 Discharge date: 01/10/2015  Recommendations for Outpatient Follow-up:  Per our pharmacy recommendation fluconazole can increase concentration of xarelto so please consider holding fluconazole for at least first 21 days while on xarelto. Talk to your oncologist when is it safe to resume fluconazole.  Discharge Diagnoses:  Principal Problem:   Preseptal cellulitis of left eye Active Problems:   Chronic obstructive airway disease with asthma   Essential tremor   Mantle cell lymphoma   Chemotherapy induced neutropenia   Thrombocytopenia   Normocytic anemia   Leg DVT (deep venous thromboembolism), acute    Discharge Condition: stable   Diet recommendation: as tolerated   History of present illness: 74 year old female with a past medical history of mantle cell lymphoma on chemotherapy (last chemotherapy 4/13/2016with rituxan, velcade, doxorubicin and cyclophosphamide) who presented to [facility] with pain in the left eye that started few days prior to this admission. She was found to have left eye cellulitis in addition to neutropenia and was started on Unasyn and Vancomycin.   Hospital course is complicated with finding of new acute right LE DVT and she was started on heparin drip. Per oncology recommendation patient will be on xarelto on discharge. First xarelto dose started 01/09/15 and she tolerated this medication swell.    Assessment/Plan:    Principal Problem:  Preseptal cellulitis of left eye  - No evidence of an abscess on CT of the orbits. - Patient has received total of 7 days of Unasyn and vancomycin. Her cellulitis is significantly better since the admission. I don't think she needs antibiotics at the time of discharge since cellulitis resolved.  Active Problems: Febrile neutropenia due to preseptal left eyes cellulitis /  Chemotherapy induced neutropenia - Neutropenia likely secondary to sequela of chemotherapy. - Patient's white blood cell count on admission was 0.3. She was given filgrastim for white blood cell count boost. Her white blood cell count is within normal limits today. Last dose of filgrastim given 01/07/2015. - For febrile neutropenia, patient was started on Unasyn and vancomycin as noted above for ocular cellulitis.   Hypothyroidism - Continue synthroid 50 mcg daily on discharge   Mantle cell lymphoma - Under the care of Dr. Marin Olp. - Last chemotherapy was 4/13: rituxan, cytoxan, velcade, doxorubicin - Continue valacyclovir for prophylaxis per oncology recommendations.  - As mentioned in follow-up section fluconazole can increase concentration of xarelto and per pharmacy it is recommended to hold fluconazole for at least first 21 days while patient on this blood thinner.     Thrombocytopenia / anemia of chronic disease - Secondary to history of malignancy and sequela of chemotherapy - Platelet count is improving, 48 --> 87 --> 108 - Hemoglobin 7.7, stable. No bleeding.    Leg DVT (deep venous thromboembolism), acute - RLE doppler positive for DVT iinvolving the right saphenofemoral junction, common femoral, femoral, profunda femoral, popliteal, posterior tibial, and peroneal veins.  - Patient was on heparin drip for anticoagulation. We changed to xarelto 01/09/2015.   Moderate protein calorie malnutrition - In the context of chronic illness  - Patient was seen by nutritionist during this hospital stay. Continue Ensure supplementation and Megace.   DVT Prophylaxis - On heparin drip then changed to xarelto 01/09/15.    Code Status: Full.  Family Communication: plan of care discussed with the patient; family not at the bedside this morning.   IV access:  Peripheral IV  PAC  Procedures and diagnostic studies:   Ct Orbits W/cm 01/04/2015 No evidence of orbital  abscess. There is fluid in the left mastoid air cells. An inflammatory process is not excluded.   RLE doppler 01/05/2015 - The right lower extremity is positive for extensive acute deep vein thrombosis involving the right saphenofemoral junction, common femoral, femoral, profunda femoral, popliteal, posterior tibial, and peroneal veins. There is no obvious evidence of left lower extremity deep vein thrombosis or bilateral Baker's cyst.  Medical Consultants:  Oncology, Dr. Lattie Haw  Other Consultants:  Nutrition   IAnti-Infectives:   Unasyn 01/04/2015 --> 01/10/2015  Vancomycin 01/04/2015 --> 01/10/2015     Signed:  Leisa Lenz, MD  [facility] 01/10/2015, 8:44 AM  Pager #: [phone]  Time spent in minutes: more than 30 minutes   Discharge Exam: Filed Vitals:   01/10/15 0500  BP: 130/68  Pulse: 81  Temp: 97.9 F (36.6 C)  Resp: 18   Filed Vitals:   01/09/15 0857 01/09/15 1359 01/09/15 2129 01/10/15 0500  BP:  128/64 120/70 130/68  Pulse:  80 78 81  Temp:  98 F (36.7 C) 98.7 F (37.1 C) 97.9 F (36.6 C)  TempSrc:  Oral Oral Oral  Resp:  18 20 18   Height:      Weight:      SpO2: 98% 100% 99% 98%    General: Pt is alert, follows commands appropriately, not in acute distress; ocular cellulitis significantly improved. Cardiovascular: Regular rate and rhythm, S1/S2 +, no murmurs Respiratory: Clear to auscultation bilaterally, no wheezing, no crackles, no rhonchi Abdominal: Soft, non tender, non distended, bowel sounds +, no guarding Extremities: no edema, no cyanosis, pulses palpable bilaterally DP and PT Neuro: Grossly nonfocal  Discharge Instructions  Discharge Instructions    Call MD for:  difficulty breathing, headache or visual disturbances    Complete by:  As directed      Call MD for:  persistant nausea and vomiting    Complete by:  As directed      Call MD for:  severe uncontrolled pain    Complete by:  As directed      Diet -  low sodium heart healthy    Complete by:  As directed      Discharge instructions    Complete by:  As directed   Per our pharmacy recommendation fluconazole can increase concentration of xarelto so please consider holding fluconazole for at least first 21 days while on xarelto. Talk to your oncologist when is it safe to resume fluconazole.     Increase activity slowly    Complete by:  As directed             Medication List    STOP taking these medications        fluconazole 100 MG tablet  Commonly known as:  DIFLUCAN     levofloxacin 500 MG tablet  Commonly known as:  LEVAQUIN     LORazepam 0.5 MG tablet  Commonly known as:  ATIVAN     predniSONE 20 MG tablet  Commonly known as:  DELTASONE      TAKE these medications        acetaminophen 325 MG tablet  Commonly known as:  TYLENOL  Take 650 mg by mouth every 6 (six) hours as needed (Pain).     ALPRAZolam 0.5 MG tablet  Commonly known as:  XANAX  Take 1/2 to 1 tablet 3 x day  as needed for anxiety or sleep  famciclovir 500 MG tablet  Commonly known as:  FAMVIR  Take 1 tablet (500 mg total) by mouth daily.     hyoscyamine 0.125 MG tablet  Commonly known as:  LEVSIN, ANASPAZ  Take 1 to 2 tablets every 4 hours as needed for nausea, cramping, bloating or diarrhea     levothyroxine 50 MCG tablet  Commonly known as:  SYNTHROID, LEVOTHROID  TAKE 1 TABLET BY MOUTH ONCE DAILY.     lidocaine-prilocaine cream  Commonly known as:  EMLA  Apply 1 application topically as needed. Apply to [facility] one hour before access.     Magnesium 200 MG Tabs  Take 1 tablet by mouth every morning.     megestrol 400 MG/10ML suspension  Commonly known as:  MEGACE  Take 10 mLs (400 mg total) by mouth 2 (two) times daily.     ondansetron 8 MG tablet  Commonly known as:  ZOFRAN  Take 1 pill twice a day for 4 days. Start the day after each chemotherapy treatment.     PRESCRIPTION MEDICATION  Chemo [facility]     PROBIOTIC DAILY PO  Take 1  capsule by mouth daily.     QVAR 80 MCG/ACT inhaler  Generic drug:  beclomethasone  INHALE 2 PUFFS INTO THE LUNGS 2 TIMES DAILY.     ranitidine 300 MG tablet  Commonly known as:  ZANTAC  Take 300 mg by mouth at bedtime.     Rivaroxaban 15 & 20 MG Tbpk  Commonly known as:  XARELTO STARTER PACK  Take as directed on package: Start with one 15mg  tablet by mouth twice a day with food. On Day 22, switch to one 20mg  tablet once a day with food.     sucralfate 1 GM/10ML suspension  Commonly known as:  CARAFATE  Take 10 mLs (1 g total) by mouth 4 (four) times daily -  with meals and at bedtime.     Vitamin D 2000 UNITS Caps  Take 2,000-4,000 capsules by mouth every other day. Patient alternates 1 cap with 2 caps every other day.           Follow-up Information    Follow up with [facility].   Why:  HHPT/HHRN/HHOT   Contact information:   [address] [place] [address] [phone]       Follow up with [facility].   Why:  bedside commode   Contact information:   [address] [place] [address] [phone]       Follow up with Alesia Richards, MD. Schedule an appointment as soon as possible for a visit in 1 week.   Specialty:  Internal Medicine   Why:  Follow up appt after recent hospitalization   Contact information:   [address] [place] [address] [phone]        The results of significant diagnostics from this hospitalization (including imaging, microbiology, ancillary and laboratory) are listed below for reference.    Significant Diagnostic Studies: Ct Orbits W/cm  01/04/2015   CLINICAL DATA:  Lymphoma. Facial swelling. Left eye pain and swelling. Symptoms for 1 day.  EXAM: CT ORBITS WITH CONTRAST  TECHNIQUE: Multidetector CT imaging of the orbits was performed following the bolus administration of intravenous contrast.  CONTRAST:  170mL OMNIPAQUE IOHEXOL 300 MG/ML  SOLN   COMPARISON:  09/16/2014  FINDINGS: The left globe is within normal limits. There is no evidence of abscess in the left orbit. The right  orbit is unremarkable. Mucous retention cyst in the left maxillary sinus. Visualized paranasal sinuses are otherwise clear. There is fluid in the left mastoid air cells. No destructive bone lesion. No fracture.  IMPRESSION: No evidence of orbital abscess.  There is fluid in the left mastoid air cells. An inflammatory process is not excluded.   Electronically Signed   By: Marybelle Killings M.D.   On: 01/04/2015 12:11    Microbiology: Recent Results (from the past 240 hour(s))  Culture, blood (routine x 2)     Status: None   Collection Time: 01/04/15 10:19 AM  Result Value Ref Range Status   Specimen Description BLOOD PORTA CATH  Final   Special Requests BOTTLES DRAWN AEROBIC AND ANAEROBIC 5 CC EACH  Final   Culture   Final    NO GROWTH 5 DAYS Performed at [facility]    Report Status 01/10/2015 FINAL  Final  Culture, blood (routine x 2)     Status: None   Collection Time: 01/04/15 10:35 AM  Result Value Ref Range Status   Specimen Description BLOOD LEFT ANTECUBITAL  Final   Special Requests BOTTLES DRAWN AEROBIC AND ANAEROBIC 5 CC EACH  Final   Culture   Final    NO GROWTH 5 DAYS Performed at [facility]    Report Status 01/10/2015 FINAL  Final     Labs: Basic Metabolic Panel:  Recent Labs Lab 01/05/15 0346 01/06/15 0442 01/07/15 0445 01/08/15 0532 01/09/15 0435  NA 132* 135 136 141 139  K 3.7 3.2* 3.6 3.5 3.6  CL 107 109 113* 111 110  CO2 21 21 20 22 23   GLUCOSE 106* 123* 103* 101* 104*  BUN 13 11 9 8 9   CREATININE 0.77 0.66 0.71 0.69 0.73  CALCIUM 8.1* 8.2* 8.2* 8.8 8.6   Liver Function Tests:  Recent Labs Lab 01/04/15 1019 01/05/15 0346 01/07/15 0445  AST 22 17 16   ALT 13 13 12   ALKPHOS 31* 27* 25*  BILITOT 0.7 0.7 0.5  PROT 5.6* 5.3* 5.2*  ALBUMIN 3.2* 2.9* 2.8*   No results for input(s): LIPASE, AMYLASE in  the last 168 hours. No results for input(s): AMMONIA in the last 168 hours. CBC:  Recent Labs Lab 01/06/15 0442 01/07/15 0445 01/08/15 0532 01/09/15 0435 01/10/15 0410  WBC 0.3* 0.6* 2.8* 8.5 7.5  NEUTROABS 0.0* 0.0* 1.2* 5.6 5.0  HGB 8.1* 7.8* 7.7* 7.7* 7.7*  HCT 23.5* 22.7* 22.7* 23.5* 23.4*  MCV 81.6 81.4 82.2 82.5 83.6  PLT 41* 48* 77* 87* 108*   Cardiac Enzymes: No results for input(s): CKTOTAL, CKMB, CKMBINDEX, TROPONINI in the last 168 hours. BNP: BNP (last 3 results) No results for input(s): BNP in the last 8760 hours.  ProBNP (last 3 results) No results for input(s): PROBNP in the last 8760 hours.  CBG: No results for input(s): GLUCAP in the last 168 hours.

## 2015-01-12 ENCOUNTER — Other Ambulatory Visit: Payer: Self-pay | Admitting: Hematology & Oncology

## 2015-01-12 DIAGNOSIS — C8310 Mantle cell lymphoma, unspecified site: Secondary | ICD-10-CM

## 2015-01-13 ENCOUNTER — Telehealth: Payer: Self-pay | Admitting: Hematology & Oncology

## 2015-01-13 NOTE — Telephone Encounter (Signed)
Pt aware of 5-3 appointment

## 2015-01-14 ENCOUNTER — Other Ambulatory Visit (HOSPITAL_BASED_OUTPATIENT_CLINIC_OR_DEPARTMENT_OTHER): Payer: Medicare Other

## 2015-01-14 ENCOUNTER — Encounter: Payer: Self-pay | Admitting: Family

## 2015-01-14 ENCOUNTER — Ambulatory Visit (HOSPITAL_BASED_OUTPATIENT_CLINIC_OR_DEPARTMENT_OTHER): Payer: Medicare Other | Admitting: Family

## 2015-01-14 VITALS — BP 116/54 | HR 90 | Temp 97.8°F | Resp 14 | Ht 63.0 in | Wt 130.0 lb

## 2015-01-14 DIAGNOSIS — Z7901 Long term (current) use of anticoagulants: Secondary | ICD-10-CM | POA: Diagnosis not present

## 2015-01-14 DIAGNOSIS — I82411 Acute embolism and thrombosis of right femoral vein: Secondary | ICD-10-CM

## 2015-01-14 DIAGNOSIS — I82431 Acute embolism and thrombosis of right popliteal vein: Secondary | ICD-10-CM | POA: Diagnosis not present

## 2015-01-14 DIAGNOSIS — C8310 Mantle cell lymphoma, unspecified site: Secondary | ICD-10-CM | POA: Diagnosis not present

## 2015-01-14 LAB — CBC WITH DIFFERENTIAL (CANCER CENTER ONLY)
BASO#: 0.1 10*3/uL (ref 0.0–0.2)
BASO%: 1.1 % (ref 0.0–2.0)
EOS ABS: 0 10*3/uL (ref 0.0–0.5)
EOS%: 0.2 % (ref 0.0–7.0)
HCT: 26.7 % — ABNORMAL LOW (ref 34.8–46.6)
HGB: 8.9 g/dL — ABNORMAL LOW (ref 11.6–15.9)
LYMPH#: 1.5 10*3/uL (ref 0.9–3.3)
LYMPH%: 22.4 % (ref 14.0–48.0)
MCH: 29.5 pg (ref 26.0–34.0)
MCHC: 33.3 g/dL (ref 32.0–36.0)
MCV: 88 fL (ref 81–101)
MONO#: 1.7 10*3/uL — ABNORMAL HIGH (ref 0.1–0.9)
MONO%: 25.2 % — AB (ref 0.0–13.0)
NEUT#: 3.4 10*3/uL (ref 1.5–6.5)
NEUT%: 51.1 % (ref 39.6–80.0)
PLATELETS: 262 10*3/uL (ref 145–400)
RBC: 3.02 10*6/uL — ABNORMAL LOW (ref 3.70–5.32)
RDW: 21.6 % — AB (ref 11.1–15.7)
WBC: 6.6 10*3/uL (ref 3.9–10.0)

## 2015-01-14 LAB — CMP (CANCER CENTER ONLY)
ALK PHOS: 35 U/L (ref 26–84)
ALT(SGPT): 29 U/L (ref 10–47)
AST: 31 U/L (ref 11–38)
Albumin: 3.1 g/dL — ABNORMAL LOW (ref 3.3–5.5)
BUN: 10 mg/dL (ref 7–22)
CHLORIDE: 106 meq/L (ref 98–108)
CO2: 25 mEq/L (ref 18–33)
Calcium: 9.1 mg/dL (ref 8.0–10.3)
Creat: 0.9 mg/dl (ref 0.6–1.2)
Glucose, Bld: 95 mg/dL (ref 73–118)
POTASSIUM: 3.6 meq/L (ref 3.3–4.7)
SODIUM: 143 meq/L (ref 128–145)
TOTAL PROTEIN: 6.3 g/dL — AB (ref 6.4–8.1)
Total Bilirubin: 0.6 mg/dl (ref 0.20–1.60)

## 2015-01-14 LAB — CHCC SATELLITE - SMEAR

## 2015-01-14 NOTE — Progress Notes (Signed)
Hematology and Oncology Follow Up Visit  Shannon Parsons [id number] April 23, 1941 74 y.o. 01/14/2015   Principle Diagnosis:  Mantle cell lymphoma (MCIPI = 5) DVT of the right leg   Current Therapy:   Patient s/p cycle 3 of VR-CAP    Interim History:  Shannon Parsons is here today for a follow-up. She went to the ED at [facility] on 4/23 for left periorbital cellulitis. It was also determined while she was there that she also had a DVT in her right leg that involves the right common femoral, profunda femoris, femoral, popliteal, posterior tibial and peroneal veins.  Her left eye is looking much better. The swelling and redness have resolved. She is still using antibiotic eye drops.  She is on a xarelto dose pack for the DVT. She is still taking 15 mg BID. She will likely be on Xarelto for at least 6 months.  She has a PET scan scheduled for tomorrow to assess the effects of treatment.  Her WBC count has come back up to 6.6.     No fever, chills, n/v, cough, rash, dizziness, headache, blurred vision, headache, SOB, mouth sores, chest pain, palpitations, abdominal pain, diarrhea, blood in urine or stool. She has had a little constipation that is relieved using Mirilax and Dulcolax. No episodes of bleeding.  No numbness or tingling in her extremities. She has a little swelling around the right ankle. Her pedal pulses are +1.  Her appetite is good and she is staying hydrated. She has gained 6 lbs since her last visit.    Medications:    Medication List       This list is accurate as of: 01/14/15 11:01 AM.  Always use your most recent med list.               acetaminophen 325 MG tablet  Commonly known as:  TYLENOL  Take 650 mg by mouth every 6 (six) hours as needed (Pain).     famciclovir 500 MG tablet  Commonly known as:  FAMVIR  Take 1 tablet (500 mg total) by mouth daily.     hyoscyamine 0.125 MG tablet  Commonly known as:  LEVSIN, ANASPAZ  Take 1 to 2 tablets every 4 hours as needed for  nausea, cramping, bloating or diarrhea     levothyroxine 50 MCG tablet  Commonly known as:  SYNTHROID, LEVOTHROID  TAKE 1 TABLET BY MOUTH ONCE DAILY.     lidocaine-prilocaine cream  Commonly known as:  EMLA  Apply 1 application topically as needed. Apply to [facility] one hour before access.     Magnesium 200 MG Tabs  Take 1 tablet by mouth every morning.     megestrol 400 MG/10ML suspension  Commonly known as:  MEGACE  Take 10 mLs (400 mg total) by mouth 2 (two) times daily.     ondansetron 8 MG tablet  Commonly known as:  ZOFRAN  Take 1 pill twice a day for 4 days. Start the day after each chemotherapy treatment.     PRESCRIPTION MEDICATION  Chem[facility]     PROBIOTIC DAILY PO  Take 1 capsule by mouth daily.     QVAR 80 MCG/ACT inhaler  Generic drug:  beclomethasone  INHALE 2 PUFFS INTO THE LUNGS 2 TIMES DAILY.     ranitidine 300 MG tablet  Commonly known as:  ZANTAC  Take 300 mg by mouth at bedtime.     Rivaroxaban 15 & 20 MG Tbpk  Commonly known as:  [place]  Take as directed on package: Start with one 15mg  tablet by mouth twice a day with food. On Day 22, switch to one 20mg  tablet once a day with food.     sucralfate 1 GM/10ML suspension  Commonly known as:  CARAFATE  Take 10 mLs (1 g total) by mouth 4 (four) times daily -  with meals and at bedtime.     Vitamin D 2000 UNITS Caps  Take 2,000-4,000 capsules by mouth every other day. Patient alternates 1 cap with 2 caps every other day.        Allergies:  Allergies  Allergen Reactions  . Actonel [Risedronate Sodium]     "made me choke"   . Fosamax [Alendronate]     "made me choke"  . Sulfa Antibiotics     Doesn't remember   . Tetracyclines & Related Rash    Past Medical History, Surgical history, Social history, and Family History were reviewed and updated.  Review of Systems: All other 10 point review of systems is negative.   Physical Exam:  vitals were not taken for this visit.  Wt  Readings from Last 3 Encounters:  01/04/15 124 lb (56.246 kg)  12/25/14 124 lb (56.246 kg)  12/16/14 126 lb (57.153 kg)    Ocular: Sclerae unicteric, pupils equal, round and reactive to light Ear-nose-throat: Oropharynx clear, dentition fair Lymphatic: No cervical or supraclavicular adenopathy Lungs no rales or rhonchi, good excursion bilaterally Heart regular rate and rhythm, no murmur appreciated Abd soft, nontender, positive bowel sounds MSK no focal spinal tenderness, no joint edema Neuro: non-focal, well-oriented, appropriate affect Breasts: Deferred  Lab Results  Component Value Date   WBC 6.6 01/14/2015   HGB 8.9* 01/14/2015   HCT 26.7* 01/14/2015   MCV 88 01/14/2015   PLT 262 01/14/2015   Lab Results  Component Value Date   FERRITIN 776* 12/16/2014   IRON 82 12/16/2014   TIBC 285 12/16/2014   UIBC 203 12/16/2014   IRONPCTSAT 29 12/16/2014   Lab Results  Component Value Date   RBC 3.02* 01/14/2015   No results found for: Nils Pyle [facility] Lab Results  Component Value Date   IGA 137 09/10/2014   No results found for: Odetta Pink, SPEI   Chemistry      Component Value Date/Time   NA 139 01/09/2015 0435   NA 136 01/01/2015 0935   K 3.6 01/09/2015 0435   K 3.3 01/01/2015 0935   CL 110 01/09/2015 0435   CL 104 01/01/2015 0935   CO2 23 01/09/2015 0435   CO2 24 01/01/2015 0935   BUN 9 01/09/2015 0435   BUN 16 01/01/2015 0935   CREATININE 0.73 01/09/2015 0435   CREATININE 1.0 01/01/2015 0935      Component Value Date/Time   CALCIUM 8.6 01/09/2015 0435   CALCIUM 9.3 01/01/2015 0935   ALKPHOS 25* 01/07/2015 0445   ALKPHOS 34 01/01/2015 0935   AST 16 01/07/2015 0445   AST 25 01/01/2015 0935   ALT 12 01/07/2015 0445   ALT 20 01/01/2015 0935   BILITOT 0.5 01/07/2015 0445   BILITOT 0.80 01/01/2015 0935     Impression and Plan: Shannon Parsons is 74 year old white female with mantle  cell lymphoma. The MCIPI shows she is an intermediate risk. She is feeling a little better. Her eye is much improved. She is now on Xarelto for a right leg DVT.  Her last treatment was held due to neutropenia. Her WBC count today is  up to 6.6.   She has a PET scan tomorrow.  Dr. Marin Olp with adjust her therapy based on her scan results.  She has an appointment with Dr. Marin Olp on May 11th. We will keep that.  She knows to call here with any questions or concerns. We can certainly see her sooner if need be.   Eliezer Bottom, NP 5/3/201611:01 AM

## 2015-01-15 ENCOUNTER — Ambulatory Visit (HOSPITAL_COMMUNITY)
Admission: RE | Admit: 2015-01-15 | Discharge: 2015-01-15 | Disposition: A | Discharge status: Home / Self Care | Payer: Medicare Other | Source: Ambulatory Visit | Attending: Family | Admitting: Family

## 2015-01-15 ENCOUNTER — Other Ambulatory Visit: Payer: Self-pay | Admitting: Family

## 2015-01-15 DIAGNOSIS — Z08 Encounter for follow-up examination after completed treatment for malignant neoplasm: Secondary | ICD-10-CM | POA: Diagnosis present

## 2015-01-15 DIAGNOSIS — C8310 Mantle cell lymphoma, unspecified site: Secondary | ICD-10-CM | POA: Diagnosis not present

## 2015-01-15 DIAGNOSIS — R918 Other nonspecific abnormal finding of lung field: Secondary | ICD-10-CM | POA: Diagnosis not present

## 2015-01-15 DIAGNOSIS — Z85820 Personal history of malignant melanoma of skin: Secondary | ICD-10-CM | POA: Insufficient documentation

## 2015-01-15 MED ORDER — FLUDEOXYGLUCOSE F - 18 (FDG) INJECTION
6.4400 | Freq: Once | INTRAVENOUS | Status: AC | PRN
  Administered 2015-01-15: 6.44 via INTRAVENOUS

## 2015-01-15 NOTE — Progress Notes (Signed)
Spoke with Shannon Parsons over the phone and notified her of her PET scan results. She has an appointment next week with Dr. Marin Olp. We will see her then.

## 2015-01-16 ENCOUNTER — Telehealth: Payer: Self-pay | Admitting: Hematology & Oncology

## 2015-01-16 LAB — GLUCOSE, CAPILLARY: Glucose-Capillary: 82 mg/dL (ref 70–99)

## 2015-01-16 NOTE — Telephone Encounter (Signed)
Per fax confirmation back, [facility] for PET scans as of 12/13/2014 w [facility].         COPY SCANNED

## 2015-01-20 ENCOUNTER — Ambulatory Visit (INDEPENDENT_AMBULATORY_CARE_PROVIDER_SITE_OTHER): Payer: Medicare Other | Admitting: Internal Medicine

## 2015-01-20 ENCOUNTER — Encounter: Payer: Self-pay | Admitting: Nurse Practitioner

## 2015-01-20 ENCOUNTER — Telehealth: Payer: Self-pay | Admitting: Nutrition

## 2015-01-20 ENCOUNTER — Encounter: Payer: Self-pay | Admitting: Internal Medicine

## 2015-01-20 VITALS — BP 126/68 | HR 84 | Temp 98.9°F | Resp 16 | Ht 63.5 in | Wt 125.4 lb

## 2015-01-20 DIAGNOSIS — C8310 Mantle cell lymphoma, unspecified site: Secondary | ICD-10-CM

## 2015-01-20 DIAGNOSIS — D649 Anemia, unspecified: Secondary | ICD-10-CM

## 2015-01-20 DIAGNOSIS — D709 Neutropenia, unspecified: Secondary | ICD-10-CM

## 2015-01-20 DIAGNOSIS — Z79899 Other long term (current) drug therapy: Secondary | ICD-10-CM

## 2015-01-20 LAB — IRON AND TIBC
%SAT: 27 % (ref 20–55)
Iron: 90 ug/dL (ref 42–145)
TIBC: 330 ug/dL (ref 250–470)
UIBC: 240 ug/dL (ref 125–400)

## 2015-01-20 LAB — VITAMIN B12: Vitamin B-12: 1942 pg/mL — ABNORMAL HIGH (ref 211–911)

## 2015-01-20 LAB — MAGNESIUM: Magnesium: 2 mg/dL (ref 1.5–2.5)

## 2015-01-20 NOTE — Progress Notes (Signed)
Received a message from [facility] stating "pt was referred for skilled nursing care. She was visited in her home, meds reviewed, general body systems assessment completed. No continuing skilled needs were identified. Pt is discharged from their nursing services per Arrie Senate, RN @ [phone].

## 2015-01-20 NOTE — Progress Notes (Addendum)
Patient ID: [name], female   DOB: 02/11/1941, 74 y.o.   MRN: [id number]  Assessment and Plan:   1. Neutropenia -if continued to be low consider EPO stimlation thereapy - CBC with Differential/Platelet  2. Medication management -could be cause for bilateral leg cramping and restlessness - Magnesium  3. Mantle cell lymphoma -f/u with Dr. Marin Parsons - CBC with Differential/Platelet  4. Normocytic anemia -may be cause for bilateral leg pain and restlessness -may likely need iron supplementation - Vitamin B12 - Iron and TIBC     HPI 74 y.o.female presents for 1 month follow up of hospitalization for left preseptal cellulitis and while in the hospitalization was diagnosed with DVT and started on xarelto.  She reports that she has been doing a little better since being out of the hospital and off her chemotherapy.  She is due to see Dr. Marin Parsons on Wednesday this week.  She states that her eye doctor saw her last week and started her on another antibiotic for her eye.  She reports that she has not had anymore fevers, chills, nausea, or vomiting.  female is taking her medication and is not having difficulty with their medications.  She does report some mild leg aching in bilateral legs and the sensation that she has to move them. She is unsure what is causing this.     Past Medical History  Diagnosis Date  . OSA (obstructive sleep apnea)   . Hiatal hernia   . IBS (irritable bowel syndrome)   . Fibrocystic breast disease   . Asthma   . Diverticulosis   . Adenomatous colon polyp 1994  . Internal hemorrhoids   . Melanoma     Facial  . Hyperlipidemia   . Hypertension   . GERD (gastroesophageal reflux disease)   . Osteoporosis   . Osteoarthritis   . Vitamin D deficiency   . Prediabetes   . Unspecified hypothyroidism   . Family history of ischemic heart disease   . Mantle cell lymphoma 10/18/2014     Allergies  Allergen Reactions  . Actonel [Risedronate Sodium]     "made  me choke"   . Fosamax [Alendronate]     "made me choke"  . Sulfa Antibiotics     Doesn't remember   . Tetracyclines & Related Rash      Current Outpatient Prescriptions on File Prior to Visit  Medication Sig Dispense Refill  . acetaminophen (TYLENOL) 325 MG tablet Take 650 mg by mouth every 6 (six) hours as needed (Pain).    . Cholecalciferol (VITAMIN D) 2000 UNITS CAPS Take 2,000-4,000 capsules by mouth every other day. Patient alternates 1 cap with 2 caps every other day.    . hyoscyamine (LEVSIN, ANASPAZ) 0.125 MG tablet Take 1 to 2 tablets every 4 hours as needed for nausea, cramping, bloating or diarrhea 90 tablet 11  . levothyroxine (SYNTHROID, LEVOTHROID) 50 MCG tablet TAKE 1 TABLET BY MOUTH ONCE DAILY. (Patient taking differently: 25 mg daily) 90 tablet 0  . lidocaine-prilocaine (EMLA) cream Apply 1 application topically as needed. Apply to [facility] one hour before access. 30 g 3  . Magnesium 200 MG TABS Take 1 tablet by mouth every morning.     [name] PRESCRIPTION MEDICATION Chemo [facility]    . Probiotic Product (PROBIOTIC DAILY PO) Take 1 capsule by mouth daily.    [name] QVAR 80 MCG/ACT inhaler INHALE 2 PUFFS INTO THE LUNGS 2 TIMES DAILY. (Patient taking differently: INHALE 1 PUFFS INTO THE LUNGS 2 TIMES DAILY.)  8.7 g 6  . ranitidine (ZANTAC) 300 MG tablet Take 300 mg by mouth at bedtime.    . Rivaroxaban (XARELTO STARTER PACK) 15 & 20 MG TBPK Take as directed on package: Start with one 15mg  tablet by mouth twice a day with food. On Day 22, switch to one 20mg  tablet once a day with food. 51 each 0  . ondansetron (ZOFRAN) 8 MG tablet Take 1 pill twice a day for 4 days. Start the day after each chemotherapy treatment. (Patient not taking: Reported on 01/14/2015) 30 tablet 3   No current facility-administered medications on file prior to visit.   Review of Systems  Constitutional: Positive for malaise/fatigue. Negative for fever and chills.  HENT: Negative for congestion, sore throat and tinnitus.    Eyes: Negative.   Respiratory: Negative for cough, shortness of breath and wheezing.   Cardiovascular: Positive for leg swelling. Negative for chest pain and palpitations.  Gastrointestinal: Negative for blood in stool and melena.  Genitourinary: Negative for hematuria.  Musculoskeletal: Positive for myalgias.  Skin: Negative.   Neurological: Negative for dizziness, sensory change, loss of consciousness and headaches.  Psychiatric/Behavioral: Negative for depression. The patient is not nervous/anxious and does not have insomnia.      Physical Exam: Filed Weights   01/20/15 1217  Weight: 125 lb 6.4 oz (56.881 kg)   BP 126/68 mmHg  Pulse 84  Temp(Src) 96.6 F (35.9 C)  Resp 16  Ht 5' 3.5" (1.613 m)  Wt 125 lb 6.4 oz (56.881 kg)  BMI 21.86 kg/m2  LMP 09/14/2014 General Appearance: Well developed well nourished, non-toxic appearing in no apparent distress. Eyes: PERRLA, EOMs, conjunctiva w/ no swelling or erythema or discharge Sinuses: No Frontal/maxillary tenderness.  There is some peeling of the skin around the left eye without discharge or redness.  There are no eyelashes of the left eye. ENT/Mouth: Ear canals clear without swelling or erythema.  TM's normal bilaterally with no retractions, bulging, or loss of landmarks.   Neck: Supple, thyroid normal, no notable JVD  Respiratory: Respiratory effort normal, Clear breath sounds anteriorly and posteriorly bilaterally without rales, rhonchi, wheezing or stridor. No retractions or accessory muscle usage. Cardio: RRR with no MRGs.   Abdomen: Soft, + BS.  Non tender, no guarding, rebound, hernias, masses.  Musculoskeletal: Full ROM, 5/5 strength, normal gait.  Skin: Warm, dry without rashes  Neuro: Awake and oriented X 3, Cranial nerves intact. Normal muscle tone, no cerebellar symptoms. Sensation intact.  Psych: normal affect, Insight and Judgment appropriate.     [name], [name], PA-C 12:34 PM [facility]

## 2015-01-20 NOTE — Telephone Encounter (Signed)
Patient called wondering if she could consume raw fruits and vegetables, during treatment. Assured patient that she could and should eat fruits and vegetables, during treatment. Enforced good food safety as well as proper washing of fruits and vegetables prior to consumption. Encouraged patient to call me back with any further questions.

## 2015-01-20 NOTE — Patient Instructions (Signed)
Neutropenia Neutropenia is a condition that occurs when the level of a certain type of white blood cell (neutrophil) in your body becomes lower than normal. Neutrophils are made in the bone marrow and fight infections. These cells protect against bacteria and viruses. The fewer neutrophils you have, and the longer your body remains without them, the greater your risk of getting a severe infection becomes. CAUSES  The cause of neutropenia may be hard to determine. However, it is usually due to 3 main problems:   Decreased production of neutrophils. This may be due to:  Certain medicines such as chemotherapy.  Genetic problems.  Cancer.  Radiation treatments.  Vitamin deficiency.  Some pesticides.  Increased destruction of neutrophils. This may be due to:  Overwhelming infections.  Hemolytic anemia. This is when the body destroys its own blood cells.  Chemotherapy.  Neutrophils moving to areas of the body where they cannot fight infections. This may be due to:  Dialysis procedures.  Conditions where the spleen becomes enlarged. Neutrophils are held in the spleen and are not available to the rest of the body.  Overwhelming infections. The neutrophils are held in the area of the infection and are not available to the rest of the body. SYMPTOMS  There are no specific symptoms of neutropenia. The lack of neutrophils can result in an infection, and an infection can cause various problems. DIAGNOSIS  Diagnosis is made by a blood test. A complete blood count is performed. The normal level of neutrophils in human blood differs with age and race. Infants have lower counts than older children and adults. African Americans have lower counts than Caucasians or Asians. The average adult level is 1500 cells/mm3 of blood. Neutrophil counts are interpreted as follows:  Greater than 1000 cells/mm3 gives normal protection against infection.  500 to 1000 cells/mm3 gives an increased risk for  infection.  200 to 500 cells/mm3 is a greater risk for severe infection.  Lower than 200 cells/mm3 is a marked risk of infection. This may require hospitalization and treatment with antibiotic medicines. TREATMENT  Treatment depends on the underlying cause, severity, and presence of infections or symptoms. It also depends on your health. Your caregiver will discuss the treatment plan with you. Mild cases are often easily treated and have a good outcome. Preventative measures may also be started to limit your risk of infections. Treatment can include:  Taking antibiotics.  Stopping medicines that are known to cause neutropenia.  Correcting nutritional deficiencies by eating green vegetables to supply folic acid and taking vitamin B supplements.  Stopping exposure to pesticides if your neutropenia is related to pesticide exposure.  Taking a blood growth factor called sargramostim, pegfilgrastim, or filgrastim if you are undergoing chemotherapy for cancer. This stimulates white blood cell production.  Removal of the spleen if you have Felty's syndrome and have repeated infections. HOME CARE INSTRUCTIONS   Follow your caregiver's instructions about when you need to have blood work done.  Wash your hands often. Make sure others who come in contact with you also wash their hands.  Wash raw fruits and vegetables before eating them. They can carry bacteria and fungi.  Avoid people with colds or spreadable (contagious) diseases (chickenpox, herpes zoster, influenza).  Avoid large crowds.  Avoid construction areas. The dust can release fungus into the air.  Be cautious around children in daycare or school environments.  Take care of your respiratory system by coughing and deep breathing.  Bathe daily.  Protect your skin from cuts and   burns.  Do not work in the garden or with flowers and plants.  Care for the mouth before and after meals by brushing with a soft toothbrush. If you have  mucositis, do not use mouthwash. Mouthwash contains alcohol and can dry out the mouth even more.  Clean the area between the genitals and the anus (perineal area) after urination and bowel movements. Women need to wipe from front to back.  Use a water soluble lubricant during sexual intercourse and practice good hygiene after. Do not have intercourse if you are severely neutropenic. Check with your caregiver for guidelines.  Exercise daily as tolerated.  Avoid people who were vaccinated with a live vaccine in the past 30 days. You should not receive live vaccines (polio, typhoid).  Do not provide direct care for pets. Avoid animal droppings. Do not clean litter boxes and bird cages.  Do not share food utensils.  Do not use tampons, enemas, or rectal suppositories unless directed by your caregiver.  Use an electric razor to remove hair.  Wash your hands after handling magazines, letters, and newspapers. SEEK IMMEDIATE MEDICAL CARE IF:   You have a fever.  You have chills or start to shake.  You feel nauseous or vomit.  You develop mouth sores.  You develop aches and pains.  You have redness and swelling around open wounds.  Your skin is warm to the touch.  You have pus coming from your wounds.  You develop swollen lymph nodes.  You feel weak or fatigued.  You develop red streaks on the skin. MAKE SURE YOU:  Understand these instructions.  Will watch your condition.  Will get help right away if you are not doing well or get worse. Document Released: 02/19/2002 Document Revised: 11/22/2011 Document Reviewed: 03/19/2011 ExitCare Patient Information 2015 [employer]. This information is not intended to replace advice given to you by your health care provider. Make sure you discuss any questions you have with your health care provider.  

## 2015-01-21 LAB — CBC WITH DIFFERENTIAL/PLATELET
BASOS ABS: 0 10*3/uL (ref 0.0–0.1)
Basophils Relative: 1 % (ref 0–1)
Eosinophils Absolute: 0 10*3/uL (ref 0.0–0.7)
Eosinophils Relative: 0 % (ref 0–5)
HEMATOCRIT: 29.5 % — AB (ref 36.0–46.0)
HEMOGLOBIN: 9.6 g/dL — AB (ref 12.0–15.0)
LYMPHS PCT: 21 % (ref 12–46)
Lymphs Abs: 1 10*3/uL (ref 0.7–4.0)
MCH: 28.9 pg (ref 26.0–34.0)
MCHC: 32.5 g/dL (ref 30.0–36.0)
MCV: 88.9 fL (ref 78.0–100.0)
MONOS PCT: 18 % — AB (ref 3–12)
MPV: 8.5 fL — ABNORMAL LOW (ref 8.6–12.4)
Monocytes Absolute: 0.9 10*3/uL (ref 0.1–1.0)
NEUTROS ABS: 2.9 10*3/uL (ref 1.7–7.7)
Neutrophils Relative %: 60 % (ref 43–77)
Platelets: 335 10*3/uL (ref 150–400)
RBC: 3.32 MIL/uL — ABNORMAL LOW (ref 3.87–5.11)
RDW: 24.1 % — ABNORMAL HIGH (ref 11.5–15.5)
WBC: 4.8 10*3/uL (ref 4.0–10.5)

## 2015-01-22 ENCOUNTER — Telehealth: Payer: Self-pay | Admitting: Hematology & Oncology

## 2015-01-22 ENCOUNTER — Ambulatory Visit: Payer: Medicare Other

## 2015-01-22 ENCOUNTER — Other Ambulatory Visit: Payer: Medicare Other

## 2015-01-22 ENCOUNTER — Ambulatory Visit (HOSPITAL_BASED_OUTPATIENT_CLINIC_OR_DEPARTMENT_OTHER): Payer: Medicare Other | Admitting: Hematology & Oncology

## 2015-01-22 ENCOUNTER — Encounter: Payer: Self-pay | Admitting: Hematology & Oncology

## 2015-01-22 VITALS — BP 112/55 | HR 86 | Temp 98.1°F | Resp 14 | Ht 63.0 in | Wt 125.0 lb

## 2015-01-22 DIAGNOSIS — C8310 Mantle cell lymphoma, unspecified site: Secondary | ICD-10-CM

## 2015-01-22 DIAGNOSIS — I82401 Acute embolism and thrombosis of unspecified deep veins of right lower extremity: Secondary | ICD-10-CM | POA: Diagnosis not present

## 2015-01-22 NOTE — Progress Notes (Signed)
Hematology and Oncology Follow Up Visit  Shannon Parsons [id number] 1941-07-01 74 y.o. 01/22/2015   Principle Diagnosis:   Mantle cell lymphoma (MCIPI = 5)  Thromboembolic event of the right leg  Current Therapy:    Patient s/p cycle #4 of RV-CAP  Xarelto 20 mg by mouth daily     Interim History:  [name] is back for follow-up. She really had a very tough time with the last cycle of chemotherapy. She was hospitalized with profound pancytopenia. She had a left orbital cellulitis. This finally got better once her white cell count improved.  Her blood counts are okay right now.  She feels a little bit better.  Thankfully, her PET scan does not show any active disease. As such, but we treated her with was working but I does don't believe that she can tolerate another cycle of this.  I believe that a very reasonable option would be to switch her over to Rituxan/bendamustine. This clearly has activity with mantle cell lymphoma. This is approved by the [facility] and I feel that she could tolerate this.  I talked to her and her husband about the change. Isolated and wife thought we needed to make a change. I think that this will be very well tolerated by her. I don't that she still will have some cytopenia from this. However, I the she would be able to handle it and it will be effective.  Her appetite seems be doing okay. She is worried about gaining too much weight.. There is no diarrhea.  She does have the thrombus in the right leg. There is no swelling of the right leg. There's a little bit of discomfort. She's had no bleeding. She is on Xarelto for this.  She's had no cough or shortness of breath. There is no chest wall pain.  Overall, her performance status is ECOG 1.  Medications:  Current outpatient prescriptions:  .  acetaminophen (TYLENOL) 325 MG tablet, Take 650 mg by mouth every 6 (six) hours as needed (Pain)., Disp: , Rfl:  .  Cholecalciferol (VITAMIN D) 2000 UNITS  CAPS, Take 2,000-4,000 capsules by mouth every other day. Patient alternates 1 cap with 2 caps every other day., Disp: , Rfl:  .  hyoscyamine (LEVSIN, ANASPAZ) 0.125 MG tablet, Take 1 to 2 tablets every 4 hours as needed for nausea, cramping, bloating or diarrhea, Disp: 90 tablet, Rfl: 11 .  levothyroxine (SYNTHROID, LEVOTHROID) 50 MCG tablet, TAKE 1 TABLET BY MOUTH ONCE DAILY. (Patient taking differently: 25 mg daily), Disp: 90 tablet, Rfl: 0 .  lidocaine-prilocaine (EMLA) cream, Apply 1 application topically as needed. Apply to [facility] one hour before access., Disp: 30 g, Rfl: 3 .  Magnesium 200 MG TABS, Take 1 tablet by mouth every morning. , Disp: , Rfl:  .  PRESCRIPTION MEDICATION, Chemo [facility], Disp: , Rfl:  .  Probiotic Product (PROBIOTIC DAILY PO), Take 1 capsule by mouth daily., Disp: , Rfl:  .  QVAR 80 MCG/ACT inhaler, INHALE 2 PUFFS INTO THE LUNGS 2 TIMES DAILY. (Patient taking differently: INHALE 1 PUFFS INTO THE LUNGS 2 TIMES DAILY.), Disp: 8.7 g, Rfl: 6 .  ranitidine (ZANTAC) 300 MG tablet, Take 300 mg by mouth at bedtime., Disp: , Rfl:  .  Rivaroxaban (XARELTO STARTER PACK) 15 & 20 MG TBPK, Take as directed on package: Start with one 15mg  tablet by mouth twice a day with food. On Day 22, switch to one 20mg  tablet once a day with food., Disp: 51 each,  Rfl: 0  Allergies:  Allergies  Allergen Reactions  . Actonel [Risedronate Sodium]     "made me choke"   . Fosamax [Alendronate]     "made me choke"  . Sulfa Antibiotics     Doesn't remember   . Tetracyclines & Related Rash    Past Medical History, Surgical history, Social history, and Family History were reviewed and updated.  Review of Systems: As above  Physical Exam:  height is 5\' 3"  (1.6 m) and weight is 125 lb (56.7 kg). Her oral temperature is 98.1 F (36.7 C). Her blood pressure is 112/55 and her pulse is 86. Her respiration is 14.   Well-developed and well-nourished white female. Head and neck exam shows no ocular or oral  lesions. She has no bilateral cervical or supraclavicular lymph nodes. She has no palpable thyroid. Lungs are clear. Cardiac exam regular rate and rhythm with no murmurs, rubs or bruits. Axillary exam shows no obvious bilateral axillary adenopathy. Abdomen is soft. She has decent bowel sounds. There is no fluid wave. Her spleen tip is just below the left costal margin. There is no hepatomegaly. Back exam shows no tenderness over the spine, ribs or hips. Extremities shows no clubbing, cyanosis or edema. No palpable venous cord is noted in the right leg. She has good range of motion of her joints. She has good strength in her muscles. Neurological exam is nonfocal. Skin exam shows no rashes, ecchymoses or petechia  Lab Results  Component Value Date   WBC 4.8 01/20/2015   HGB 9.6* 01/20/2015   HCT 29.5* 01/20/2015   MCV 88.9 01/20/2015   PLT 335 01/20/2015     Chemistry      Component Value Date/Time   NA 143 01/14/2015 1038   NA 139 01/09/2015 0435   K 3.6 01/14/2015 1038   K 3.6 01/09/2015 0435   CL 106 01/14/2015 1038   CL 110 01/09/2015 0435   CO2 25 01/14/2015 1038   CO2 23 01/09/2015 0435   BUN 10 01/14/2015 1038   BUN 9 01/09/2015 0435   CREATININE 0.9 01/14/2015 1038   CREATININE 0.73 01/09/2015 0435      Component Value Date/Time   CALCIUM 9.1 01/14/2015 1038   CALCIUM 8.6 01/09/2015 0435   ALKPHOS 35 01/14/2015 1038   ALKPHOS 25* 01/07/2015 0445   AST 31 01/14/2015 1038   AST 16 01/07/2015 0445   ALT 29 01/14/2015 1038   ALT 12 01/07/2015 0445   BILITOT 0.60 01/14/2015 1038   BILITOT 0.5 01/07/2015 0445         Impression and Plan: Shannon Parsons is 74 year old white female. She has mantle cell lymphoma. Again, by the [facility] she is an intermediate risk.  Because of the intolerance of the prior chemotherapy regimen, we will have to make a change. I really think that this is necessary because of her difficulties and I don't think that dose reduction would give [place]  benefit.  She asked me about stem cell transplantation. When I first saw her, I did not think that she was a good candidate for stem cell transplantation because of her performance status. However, her performance status is improving although she did take a "hit" with the last cycle of treatment. I certainly would not rule out the possibility of a stem cell transplant.  We will have her come back in 2 weeks to check her blood counts. I spent about 45 minutes with she and her husband. Volanda Napoleon, MD 5/11/201611:06 AM

## 2015-01-22 NOTE — Telephone Encounter (Signed)
Patient came into office for apt.  She mention her Doctor's office should of sent labs over and she would not be having her labs drawn today.  Spoke with Rennie Natter and per Roselyn Reef no labs needed.  Lab apt was cx and susan was notified that patient was not getting labs but is here early and checked in to see Dr.E

## 2015-01-22 NOTE — Telephone Encounter (Signed)
[id number] Treanda Injection, bendamustine hcl, 1 mg  - NPR

## 2015-01-23 ENCOUNTER — Ambulatory Visit (HOSPITAL_BASED_OUTPATIENT_CLINIC_OR_DEPARTMENT_OTHER): Payer: Medicare Other

## 2015-01-23 VITALS — BP 114/67 | HR 70 | Temp 98.0°F | Resp 20

## 2015-01-23 DIAGNOSIS — Z5112 Encounter for antineoplastic immunotherapy: Secondary | ICD-10-CM

## 2015-01-23 DIAGNOSIS — C8310 Mantle cell lymphoma, unspecified site: Secondary | ICD-10-CM

## 2015-01-23 DIAGNOSIS — Z5111 Encounter for antineoplastic chemotherapy: Secondary | ICD-10-CM

## 2015-01-23 MED ORDER — SODIUM CHLORIDE 0.9 % IV SOLN
Freq: Once | INTRAVENOUS | Status: AC
  Administered 2015-01-23: 09:00:00 via INTRAVENOUS
  Filled 2015-01-23: qty 4

## 2015-01-23 MED ORDER — SODIUM CHLORIDE 0.9 % IV SOLN
Freq: Once | INTRAVENOUS | Status: AC
Start: 2015-01-23 — End: 2015-01-23
  Administered 2015-01-23: 09:00:00 via INTRAVENOUS

## 2015-01-23 MED ORDER — HEPARIN SOD (PORK) LOCK FLUSH 100 UNIT/ML IV SOLN
500.0000 [IU] | Freq: Once | INTRAVENOUS | Status: AC | PRN
Start: 2015-01-23 — End: 2015-01-23
  Administered 2015-01-23: 500 [IU]
  Filled 2015-01-23: qty 5

## 2015-01-23 MED ORDER — ONDANSETRON HCL 8 MG PO TABS: 8.0000 mg | ORAL_TABLET | Freq: Two times a day (BID) | ORAL | Status: DC

## 2015-01-23 MED ORDER — SODIUM CHLORIDE 0.9 % IJ SOLN
10.0000 mL | INTRAMUSCULAR | Status: DC | PRN
  Administered 2015-01-23: 10 mL
  Filled 2015-01-23: qty 10

## 2015-01-23 MED ORDER — ACETAMINOPHEN 325 MG PO TABS
ORAL_TABLET | ORAL | Status: AC
  Filled 2015-01-23: qty 2

## 2015-01-23 MED ORDER — SODIUM CHLORIDE 0.9 % IV SOLN
100.0000 mg/m2 | Freq: Once | INTRAVENOUS | Status: AC
  Administered 2015-01-23: 150 mg via INTRAVENOUS
  Filled 2015-01-23: qty 6

## 2015-01-23 MED ORDER — ACETAMINOPHEN 325 MG PO TABS
650.0000 mg | ORAL_TABLET | Freq: Once | ORAL | Status: AC
  Administered 2015-01-23: 650 mg via ORAL

## 2015-01-23 MED ORDER — SODIUM CHLORIDE 0.9 % IV SOLN
375.0000 mg/m2 | Freq: Once | INTRAVENOUS | Status: AC
  Administered 2015-01-23: 600 mg via INTRAVENOUS
  Filled 2015-01-23: qty 60

## 2015-01-23 MED ORDER — DIPHENHYDRAMINE HCL 25 MG PO CAPS
ORAL_CAPSULE | ORAL | Status: AC
  Filled 2015-01-23: qty 2

## 2015-01-23 MED ORDER — PROCHLORPERAZINE MALEATE 10 MG PO TABS: 10.0000 mg | ORAL_TABLET | Freq: Four times a day (QID) | ORAL | Status: DC | PRN

## 2015-01-23 MED ORDER — DIPHENHYDRAMINE HCL 25 MG PO CAPS
50.0000 mg | ORAL_CAPSULE | Freq: Once | ORAL | Status: AC
  Administered 2015-01-23: 50 mg via ORAL

## 2015-01-23 MED ORDER — LORAZEPAM 0.5 MG PO TABS: 0.5000 mg | ORAL_TABLET | Freq: Four times a day (QID) | ORAL | Status: DC | PRN

## 2015-01-23 NOTE — Progress Notes (Signed)
Dr. Marin Olp aware of double vision and leg pain, no orders received. Pt to see eye doctor today for macular degeneration.

## 2015-01-23 NOTE — Patient Instructions (Addendum)
Bendamustine Injection What is this medicine? BENDAMUSTINE (BEN da MUS teen) is a chemotherapy drug. It is used to treat chronic lymphocytic leukemia and non-Hodgkin lymphoma. This medicine may be used for other purposes; ask your health care provider or pharmacist if you have questions. COMMON BRAND NAME(S): Treanda What should I tell my health care provider before I take this medicine? They need to know if you have any of these conditions: -kidney disease -liver disease -an unusual or allergic reaction to bendamustine, mannitol, other medicines, foods, dyes, or preservatives -pregnant or trying to get pregnant -breast-feeding How should I use this medicine? This medicine is for infusion into a vein. It is given by a health care professional in a hospital or clinic setting. Talk to your pediatrician regarding the use of this medicine in children. Special care may be needed. Overdosage: If you think you have taken too much of this medicine contact a poison control center or emergency room at once. NOTE: This medicine is only for you. Do not share this medicine with others. What if I miss a dose? It is important not to miss your dose. Call your doctor or health care professional if you are unable to keep an appointment. What may interact with this medicine? Do not take this medicine with any of the following medications: -clozapine This medicine may also interact with the following medications: -atazanavir -cimetidine -ciprofloxacin -enoxacin -fluvoxamine -medicines for seizures like carbamazepine and phenobarbital -mexiletine -rifampin -tacrine -thiabendazole -zileuton This list may not describe all possible interactions. Give your health care provider a list of all the medicines, herbs, non-prescription drugs, or dietary supplements you use. Also tell them if you smoke, drink alcohol, or use illegal drugs. Some items may interact with your medicine. What should I watch for while  using this medicine? Your condition will be monitored carefully while you are receiving this medicine. This drug may make you feel generally unwell. This is not uncommon, as chemotherapy can affect healthy cells as well as cancer cells. Report any side effects. Continue your course of treatment even though you feel ill unless your doctor tells you to stop. Call your doctor or health care professional for advice if you get a fever, chills or sore throat, or other symptoms of a cold or flu. Do not treat yourself. This drug decreases your body's ability to fight infections. Try to avoid being around people who are sick. This medicine may increase your risk to bruise or bleed. Call your doctor or health care professional if you notice any unusual bleeding. Be careful brushing and flossing your teeth or using a toothpick because you may get an infection or bleed more easily. If you have any dental work done, tell your dentist you are receiving this medicine. Avoid taking products that contain aspirin, acetaminophen, ibuprofen, naproxen, or ketoprofen unless instructed by your doctor. These medicines may hide a fever. Do not become pregnant while taking this medicine. Women should inform their doctor if they wish to become pregnant or think they might be pregnant. There is a potential for serious side effects to an unborn child. Men should inform their doctors if they wish to father a child. This medicine may lower sperm counts. Talk to your health care professional or pharmacist for more information. Do not breast-feed an infant while taking this medicine. What side effects may I notice from receiving this medicine? Side effects that you should report to your doctor or health care professional as soon as possible: -allergic reactions like skin rash,  itching or hives, swelling of the face, lips, or tongue -low blood counts - this medicine may decrease the number of white blood cells, red blood cells and  platelets. You may be at increased risk for infections and bleeding. -signs of infection - fever or chills, cough, sore throat, pain or difficulty passing urine -signs of decreased platelets or bleeding - bruising, pinpoint red spots on the skin, black, tarry stools, blood in the urine -signs of decreased red blood cells - unusually weak or tired, fainting spells, lightheadedness -trouble passing urine or change in the amount of urine Side effects that usually do not require medical attention (report to your doctor or health care professional if they continue or are bothersome): -diarrhea This list may not describe all possible side effects. Call your doctor for medical advice about side effects. You may report side effects to FDA at 1-800-FDA-1088. Where should I keep my medicine? This drug is given in a hospital or clinic and will not be stored at home. NOTE: This sheet is a summary. It may not cover all possible information. If you have questions about this medicine, talk to your doctor, pharmacist, or health care provider.  2015, Elsevier/Gold Standard. (2011-11-26 14:15:47) [facility] Discharge Instructions for Patients Receiving Chemotherapy  Today you received the following chemotherapy agents: Bendeka and Rituxan  To help prevent nausea and vomiting after your treatment, we encourage you to take your nausea medications as written on the bottle.  I have called in Zofran, Compazine, and I am giving you the Rx for Ativan.     If you develop nausea and vomiting that is not controlled by your nausea medication, call the clinic.   BELOW ARE SYMPTOMS THAT SHOULD BE REPORTED IMMEDIATELY:  *FEVER GREATER THAN 100.5 F  *CHILLS WITH OR WITHOUT FEVER  NAUSEA AND VOMITING THAT IS NOT CONTROLLED WITH YOUR NAUSEA MEDICATION  *UNUSUAL SHORTNESS OF BREATH  *UNUSUAL BRUISING OR BLEEDING  TENDERNESS IN MOUTH AND THROAT WITH OR WITHOUT PRESENCE OF ULCERS  *URINARY  PROBLEMS  *BOWEL PROBLEMS  UNUSUAL RASH Items with * indicate a potential emergency and should be followed up as soon as possible.  Feel free to call the clinic you have any questions or concerns. The clinic phone number is [phone].  Please show the [place] at check-in to the Emergency Department and triage nurse.

## 2015-01-24 ENCOUNTER — Ambulatory Visit (HOSPITAL_BASED_OUTPATIENT_CLINIC_OR_DEPARTMENT_OTHER): Payer: Medicare Other

## 2015-01-24 VITALS — BP 121/62 | HR 79 | Temp 98.1°F | Resp 18

## 2015-01-24 DIAGNOSIS — Z5111 Encounter for antineoplastic chemotherapy: Secondary | ICD-10-CM

## 2015-01-24 DIAGNOSIS — C8310 Mantle cell lymphoma, unspecified site: Secondary | ICD-10-CM

## 2015-01-24 MED ORDER — HEPARIN SOD (PORK) LOCK FLUSH 100 UNIT/ML IV SOLN
500.0000 [IU] | Freq: Once | INTRAVENOUS | Status: AC | PRN
  Administered 2015-01-24: 500 [IU]
  Filled 2015-01-24: qty 5

## 2015-01-24 MED ORDER — BENDAMUSTINE HCL CHEMO INJECTION 100 MG/4ML
100.0000 mg/m2 | Freq: Once | INTRAVENOUS | Status: AC
  Administered 2015-01-24: 150 mg via INTRAVENOUS
  Filled 2015-01-24: qty 6

## 2015-01-24 MED ORDER — SODIUM CHLORIDE 0.9 % IJ SOLN
10.0000 mL | INTRAMUSCULAR | Status: DC | PRN
  Administered 2015-01-24: 10 mL
  Filled 2015-01-24: qty 10

## 2015-01-24 MED ORDER — SODIUM CHLORIDE 0.9 % IV SOLN
Freq: Once | INTRAVENOUS | Status: AC
  Administered 2015-01-24: 09:00:00 via INTRAVENOUS

## 2015-01-24 MED ORDER — SODIUM CHLORIDE 0.9 % IV SOLN
Freq: Once | INTRAVENOUS | Status: AC
  Administered 2015-01-24: 09:00:00 via INTRAVENOUS
  Filled 2015-01-24: qty 4

## 2015-01-24 NOTE — Patient Instructions (Signed)
Bendamustine Injection What is this medicine? BENDAMUSTINE (BEN da MUS teen) is a chemotherapy drug. It is used to treat chronic lymphocytic leukemia and non-Hodgkin lymphoma. This medicine may be used for other purposes; ask your health care provider or pharmacist if you have questions. COMMON BRAND NAME(S): Treanda What should I tell my health care provider before I take this medicine? They need to know if you have any of these conditions: -kidney disease -liver disease -an unusual or allergic reaction to bendamustine, mannitol, other medicines, foods, dyes, or preservatives -pregnant or trying to get pregnant -breast-feeding How should I use this medicine? This medicine is for infusion into a vein. It is given by a health care professional in a hospital or clinic setting. Talk to your pediatrician regarding the use of this medicine in children. Special care may be needed. Overdosage: If you think you have taken too much of this medicine contact a poison control center or emergency room at once. NOTE: This medicine is only for you. Do not share this medicine with others. What if I miss a dose? It is important not to miss your dose. Call your doctor or health care professional if you are unable to keep an appointment. What may interact with this medicine? Do not take this medicine with any of the following medications: -clozapine This medicine may also interact with the following medications: -atazanavir -cimetidine -ciprofloxacin -enoxacin -fluvoxamine -medicines for seizures like carbamazepine and phenobarbital -mexiletine -rifampin -tacrine -thiabendazole -zileuton This list may not describe all possible interactions. Give your health care provider a list of all the medicines, herbs, non-prescription drugs, or dietary supplements you use. Also tell them if you smoke, drink alcohol, or use illegal drugs. Some items may interact with your medicine. What should I watch for while  using this medicine? Your condition will be monitored carefully while you are receiving this medicine. This drug may make you feel generally unwell. This is not uncommon, as chemotherapy can affect healthy cells as well as cancer cells. Report any side effects. Continue your course of treatment even though you feel ill unless your doctor tells you to stop. Call your doctor or health care professional for advice if you get a fever, chills or sore throat, or other symptoms of a cold or flu. Do not treat yourself. This drug decreases your body's ability to fight infections. Try to avoid being around people who are sick. This medicine may increase your risk to bruise or bleed. Call your doctor or health care professional if you notice any unusual bleeding. Be careful brushing and flossing your teeth or using a toothpick because you may get an infection or bleed more easily. If you have any dental work done, tell your dentist you are receiving this medicine. Avoid taking products that contain aspirin, acetaminophen, ibuprofen, naproxen, or ketoprofen unless instructed by your doctor. These medicines may hide a fever. Do not become pregnant while taking this medicine. Women should inform their doctor if they wish to become pregnant or think they might be pregnant. There is a potential for serious side effects to an unborn child. Men should inform their doctors if they wish to father a child. This medicine may lower sperm counts. Talk to your health care professional or pharmacist for more information. Do not breast-feed an infant while taking this medicine. What side effects may I notice from receiving this medicine? Side effects that you should report to your doctor or health care professional as soon as possible: -allergic reactions like skin rash,   itching or hives, swelling of the face, lips, or tongue -low blood counts - this medicine may decrease the number of white blood cells, red blood cells and  platelets. You may be at increased risk for infections and bleeding. -signs of infection - fever or chills, cough, sore throat, pain or difficulty passing urine -signs of decreased platelets or bleeding - bruising, pinpoint red spots on the skin, black, tarry stools, blood in the urine -signs of decreased red blood cells - unusually weak or tired, fainting spells, lightheadedness -trouble passing urine or change in the amount of urine Side effects that usually do not require medical attention (report to your doctor or health care professional if they continue or are bothersome): -diarrhea This list may not describe all possible side effects. Call your doctor for medical advice about side effects. You may report side effects to FDA at 1-800-FDA-1088. Where should I keep my medicine? This drug is given in a hospital or clinic and will not be stored at home. NOTE: This sheet is a summary. It may not cover all possible information. If you have questions about this medicine, talk to your doctor, pharmacist, or health care provider.  2015, Elsevier/Gold Standard. (2011-11-26 14:15:47)  

## 2015-02-05 ENCOUNTER — Other Ambulatory Visit: Payer: Self-pay | Admitting: *Deleted

## 2015-02-05 DIAGNOSIS — C8310 Mantle cell lymphoma, unspecified site: Secondary | ICD-10-CM

## 2015-02-06 ENCOUNTER — Ambulatory Visit (HOSPITAL_BASED_OUTPATIENT_CLINIC_OR_DEPARTMENT_OTHER): Payer: Medicare Other | Admitting: Family

## 2015-02-06 ENCOUNTER — Other Ambulatory Visit (HOSPITAL_BASED_OUTPATIENT_CLINIC_OR_DEPARTMENT_OTHER): Payer: Medicare Other

## 2015-02-06 VITALS — BP 130/66 | HR 97 | Temp 98.2°F | Resp 18 | Wt 125.0 lb

## 2015-02-06 DIAGNOSIS — N390 Urinary tract infection, site not specified: Secondary | ICD-10-CM

## 2015-02-06 DIAGNOSIS — C8310 Mantle cell lymphoma, unspecified site: Secondary | ICD-10-CM

## 2015-02-06 LAB — COMPREHENSIVE METABOLIC PANEL
ALBUMIN: 3.8 g/dL (ref 3.5–5.2)
ALT: 16 U/L (ref 0–35)
AST: 26 U/L (ref 0–37)
Alkaline Phosphatase: 48 U/L (ref 39–117)
BILIRUBIN TOTAL: 0.4 mg/dL (ref 0.2–1.2)
BUN: 12 mg/dL (ref 6–23)
CALCIUM: 9.6 mg/dL (ref 8.4–10.5)
CHLORIDE: 101 meq/L (ref 96–112)
CO2: 26 mEq/L (ref 19–32)
Creatinine, Ser: 0.74 mg/dL (ref 0.50–1.10)
GLUCOSE: 84 mg/dL (ref 70–99)
Potassium: 4.5 mEq/L (ref 3.5–5.3)
SODIUM: 134 meq/L — AB (ref 135–145)
Total Protein: 5.9 g/dL — ABNORMAL LOW (ref 6.0–8.3)

## 2015-02-06 LAB — CBC WITH DIFFERENTIAL (CANCER CENTER ONLY)
BASO#: 0.1 10*3/uL (ref 0.0–0.2)
BASO%: 0.9 % (ref 0.0–2.0)
EOS ABS: 1 10*3/uL — AB (ref 0.0–0.5)
EOS%: 12.3 % — ABNORMAL HIGH (ref 0.0–7.0)
HCT: 34.9 % (ref 34.8–46.6)
HEMOGLOBIN: 11.5 g/dL — AB (ref 11.6–15.9)
LYMPH#: 1.4 10*3/uL (ref 0.9–3.3)
LYMPH%: 17.7 % (ref 14.0–48.0)
MCH: 32 pg (ref 26.0–34.0)
MCHC: 33 g/dL (ref 32.0–36.0)
MCV: 97 fL (ref 81–101)
MONO#: 1.1 10*3/uL — AB (ref 0.1–0.9)
MONO%: 14.7 % — AB (ref 0.0–13.0)
NEUT%: 54.4 % (ref 39.6–80.0)
NEUTROS ABS: 4.2 10*3/uL (ref 1.5–6.5)
PLATELETS: 226 10*3/uL (ref 145–400)
RBC: 3.59 10*6/uL — ABNORMAL LOW (ref 3.70–5.32)
RDW: 18.4 % — ABNORMAL HIGH (ref 11.1–15.7)
WBC: 7.7 10*3/uL (ref 3.9–10.0)

## 2015-02-06 LAB — URINALYSIS, MICROSCOPIC (CHCC SATELLITE)
BILIRUBIN (URINE): NEGATIVE
Glucose: NEGATIVE mg/dL
KETONES: NEGATIVE mg/dL
Nitrite: POSITIVE
PH: 6 (ref 4.60–8.00)
Specific Gravity, Urine: 1.01 (ref 1.003–1.035)
Urobilinogen, UR: 0.2 mg/dL (ref 0.2–1)

## 2015-02-06 LAB — CHCC SATELLITE - SMEAR

## 2015-02-06 MED ORDER — CIPROFLOXACIN HCL 500 MG PO TABS: 500.0000 mg | ORAL_TABLET | Freq: Two times a day (BID) | ORAL | Status: DC

## 2015-02-06 MED ORDER — RIVAROXABAN 20 MG PO TABS: 20.0000 mg | ORAL_TABLET | Freq: Every day | ORAL | Status: DC

## 2015-02-06 NOTE — Progress Notes (Signed)
Hematology and Oncology Follow Up Visit  Shannon Parsons [id number] [birth year] 74 y.o. 02/06/2015   Principle Diagnosis:  Mantle cell lymphoma (MCIPI = 5) DVT of the right leg   Current Therapy:   Patient s/p cycle 1 of Rituxan/Bendamustine    Interim History:  Shannon Parsons is here today for a follow-up. She is feeling much better since her chemo was changed to rituxan/bendamustine. He blood work is also much improved. Her WBC count is 7.7 Hgb 11.5 and platelets 226.  She is no on Xarelto 20 mg daily for her DVT of the right leg. No episodes of bleeding or bruising. Her fatigue is improved. She is having some aching in her legs that comes and goes.  She has some dry areas around her neck on on her head. She is using coconut oil and eucerin cream and this has helped.  No fever, chills, n/v, cough, dizziness, headache, blurred vision, headache, SOB, mouth sores, chest pain, palpitations, abdominal pain, diarrhea, blood in urine or stool.  She has had urinary frequency and urgency this week. She also does not feel that she is emptying her bladder when she goes to the bathroom. Her UA was positive for a UTI.  No swelling, numbness or tingling in her extremities. She is wearing a compression stocking on her right leg.  Her appetite is good and she is staying hydrated. Her weight is stable.    Medications:    Medication List       This list is accurate as of: 02/06/15 10:29 AM.  Always use your most recent med list.               acetaminophen 325 MG tablet  Commonly known as:  TYLENOL  Take 650 mg by mouth every 6 (six) hours as needed (Pain).     hyoscyamine 0.125 MG tablet  Commonly known as:  LEVSIN, ANASPAZ  Take 1 to 2 tablets every 4 hours as needed for nausea, cramping, bloating or diarrhea     levothyroxine 50 MCG tablet  Commonly known as:  SYNTHROID, LEVOTHROID  TAKE 1 TABLET BY MOUTH ONCE DAILY.     lidocaine-prilocaine cream  Commonly known as:  EMLA  Apply 1  application topically as needed. Apply to [facility] one hour before access.     LORazepam 0.5 MG tablet  Commonly known as:  ATIVAN  Take 1 tablet (0.5 mg total) by mouth every 6 (six) hours as needed (Nausea or vomiting).     Magnesium 200 MG Tabs  Take 1 tablet by mouth every morning.     ondansetron 8 MG tablet  Commonly known as:  ZOFRAN  Take 1 tablet (8 mg total) by mouth 2 (two) times daily. Start the day after chemo for 2 days. Then take as needed for nausea or vomiting.     PRESCRIPTION MEDICATION  Chemo [facility]     PROBIOTIC DAILY PO  Take 1 capsule by mouth daily.     prochlorperazine 10 MG tablet  Commonly known as:  COMPAZINE  Take 1 tablet (10 mg total) by mouth every 6 (six) hours as needed (Nausea or vomiting).     QVAR 80 MCG/ACT inhaler  Generic drug:  beclomethasone  INHALE 2 PUFFS INTO THE LUNGS 2 TIMES DAILY.     ranitidine 300 MG tablet  Commonly known as:  ZANTAC  Take 300 mg by mouth at bedtime.     Rivaroxaban 15 & 20 MG Tbpk  Commonly known as:  Dollene Primrose  PACK  Take as directed on package: Start with one 15mg  tablet by mouth twice a day with food. On Day 22, switch to one 20mg  tablet once a day with food.     Vitamin D 2000 UNITS Caps  Take 2,000-4,000 capsules by mouth every other day. Patient alternates 1 cap with 2 caps every other day.        Allergies:  Allergies  Allergen Reactions  . Actonel [Risedronate Sodium]     "made me choke"   . Fosamax [Alendronate]     "made me choke"  . Sulfa Antibiotics     Doesn't remember   . Tetracyclines & Related Rash    Past Medical History, Surgical history, Social history, and Family History were reviewed and updated.  Review of Systems: All other 10 point review of systems is negative.   Physical Exam:  weight is 125 lb (56.7 kg). Her oral temperature is 98.2 F (36.8 C). Her blood pressure is 130/66 and her pulse is 97. Her respiration is 18.   Wt Readings from Last 3 Encounters:    02/06/15 125 lb (56.7 kg)  01/22/15 125 lb (56.7 kg)  01/20/15 125 lb 6.4 oz (56.881 kg)    Ocular: Sclerae unicteric, pupils equal, round and reactive to light Ear-nose-throat: Oropharynx clear, dentition fair Lymphatic: No cervical or supraclavicular adenopathy Lungs no rales or rhonchi, good excursion bilaterally Heart regular rate and rhythm, no murmur appreciated Abd soft, nontender, positive bowel sounds MSK no focal spinal tenderness, no joint edema Neuro: non-focal, well-oriented, appropriate affect Breasts: Deferred  Lab Results  Component Value Date   WBC 7.7 02/06/2015   HGB 11.5* 02/06/2015   HCT 34.9 02/06/2015   MCV 97 02/06/2015   PLT 226 02/06/2015   Lab Results  Component Value Date   FERRITIN 776* 12/16/2014   IRON 90 01/20/2015   TIBC 330 01/20/2015   UIBC 240 01/20/2015   IRONPCTSAT 27 01/20/2015   Lab Results  Component Value Date   RBC 3.59* 02/06/2015   No results found for: Nils Pyle [facility] Lab Results  Component Value Date   IGA 137 09/10/2014   No results found for: Odetta Pink, SPEI   Chemistry      Component Value Date/Time   NA 143 01/14/2015 1038   NA 139 01/09/2015 0435   K 3.6 01/14/2015 1038   K 3.6 01/09/2015 0435   CL 106 01/14/2015 1038   CL 110 01/09/2015 0435   CO2 25 01/14/2015 1038   CO2 23 01/09/2015 0435   BUN 10 01/14/2015 1038   BUN 9 01/09/2015 0435   CREATININE 0.9 01/14/2015 1038   CREATININE 0.73 01/09/2015 0435      Component Value Date/Time   CALCIUM 9.1 01/14/2015 1038   CALCIUM 8.6 01/09/2015 0435   ALKPHOS 35 01/14/2015 1038   ALKPHOS 25* 01/07/2015 0445   AST 31 01/14/2015 1038   AST 16 01/07/2015 0445   ALT 29 01/14/2015 1038   ALT 12 01/07/2015 0445   BILITOT 0.60 01/14/2015 1038   BILITOT 0.5 01/07/2015 0445     Impression and Plan: Shannon Parsons is 74 year old white female with mantle cell lymphoma. She has done  much better with her first cycle of rituxan/bendamustine. Her pancytopenia has resolved and her fatigue has improved.  She does have a UTI. We will have her take Cipro BID for 5 days.  She will continue on Xarelto 20 mg daily. We refilled her prescription for  her today.  She will see Dr. Marin Olp and start cycle 2 on June 9th.  She knows to call here with any questions or concerns. We can certainly see her sooner if need be.   Eliezer Bottom, NP 5/26/201610:29 AM

## 2015-02-08 LAB — URINE CULTURE

## 2015-02-20 ENCOUNTER — Other Ambulatory Visit: Payer: Medicare Other

## 2015-02-20 ENCOUNTER — Other Ambulatory Visit (HOSPITAL_BASED_OUTPATIENT_CLINIC_OR_DEPARTMENT_OTHER): Payer: Medicare Other

## 2015-02-20 ENCOUNTER — Ambulatory Visit (HOSPITAL_BASED_OUTPATIENT_CLINIC_OR_DEPARTMENT_OTHER): Payer: Medicare Other

## 2015-02-20 ENCOUNTER — Ambulatory Visit: Payer: Medicare Other

## 2015-02-20 ENCOUNTER — Ambulatory Visit (HOSPITAL_BASED_OUTPATIENT_CLINIC_OR_DEPARTMENT_OTHER): Payer: Medicare Other | Admitting: Hematology & Oncology

## 2015-02-20 VITALS — BP 96/62 | HR 64 | Temp 98.2°F | Resp 20

## 2015-02-20 VITALS — BP 122/78 | HR 82 | Temp 97.7°F | Wt 122.0 lb

## 2015-02-20 DIAGNOSIS — C8310 Mantle cell lymphoma, unspecified site: Secondary | ICD-10-CM

## 2015-02-20 DIAGNOSIS — Z5111 Encounter for antineoplastic chemotherapy: Secondary | ICD-10-CM

## 2015-02-20 DIAGNOSIS — Z86718 Personal history of other venous thrombosis and embolism: Secondary | ICD-10-CM | POA: Diagnosis not present

## 2015-02-20 DIAGNOSIS — M79604 Pain in right leg: Secondary | ICD-10-CM | POA: Diagnosis not present

## 2015-02-20 LAB — CBC WITH DIFFERENTIAL (CANCER CENTER ONLY)
BASO#: 0.1 10*3/uL (ref 0.0–0.2)
BASO%: 1.2 % (ref 0.0–2.0)
EOS%: 23.4 % — AB (ref 0.0–7.0)
Eosinophils Absolute: 1 10*3/uL — ABNORMAL HIGH (ref 0.0–0.5)
HCT: 37.6 % (ref 34.8–46.6)
HGB: 12.6 g/dL (ref 11.6–15.9)
LYMPH#: 0.9 10*3/uL (ref 0.9–3.3)
LYMPH%: 21 % (ref 14.0–48.0)
MCH: 31.9 pg (ref 26.0–34.0)
MCHC: 33.5 g/dL (ref 32.0–36.0)
MCV: 95 fL (ref 81–101)
MONO#: 0.7 10*3/uL (ref 0.1–0.9)
MONO%: 16.3 % — AB (ref 0.0–13.0)
NEUT#: 1.6 10*3/uL (ref 1.5–6.5)
NEUT%: 38.1 % — AB (ref 39.6–80.0)
Platelets: 154 10*3/uL (ref 145–400)
RBC: 3.95 10*6/uL (ref 3.70–5.32)
RDW: 14.5 % (ref 11.1–15.7)
WBC: 4.2 10*3/uL (ref 3.9–10.0)

## 2015-02-20 LAB — CMP (CANCER CENTER ONLY)
ALT(SGPT): 24 U/L (ref 10–47)
AST: 32 U/L (ref 11–38)
Albumin: 3.6 g/dL (ref 3.3–5.5)
Alkaline Phosphatase: 47 U/L (ref 26–84)
BILIRUBIN TOTAL: 0.7 mg/dL (ref 0.20–1.60)
BUN, Bld: 11 mg/dL (ref 7–22)
CALCIUM: 10 mg/dL (ref 8.0–10.3)
CO2: 27 meq/L (ref 18–33)
Chloride: 101 mEq/L (ref 98–108)
Creat: 0.8 mg/dl (ref 0.6–1.2)
Glucose, Bld: 78 mg/dL (ref 73–118)
POTASSIUM: 4.1 meq/L (ref 3.3–4.7)
Sodium: 140 mEq/L (ref 128–145)
TOTAL PROTEIN: 6.6 g/dL (ref 6.4–8.1)

## 2015-02-20 LAB — CHCC SATELLITE - SMEAR

## 2015-02-20 MED ORDER — SODIUM CHLORIDE 0.9 % IV SOLN
375.0000 mg/m2 | Freq: Once | INTRAVENOUS | Status: AC
  Administered 2015-02-20: 600 mg via INTRAVENOUS
  Filled 2015-02-20: qty 60

## 2015-02-20 MED ORDER — SODIUM CHLORIDE 0.9 % IJ SOLN
10.0000 mL | INTRAMUSCULAR | Status: DC | PRN
  Administered 2015-02-20: 10 mL
  Filled 2015-02-20: qty 10

## 2015-02-20 MED ORDER — SODIUM CHLORIDE 0.9 % IV SOLN
Freq: Once | INTRAVENOUS | Status: AC
  Administered 2015-02-20: 12:00:00 via INTRAVENOUS
  Filled 2015-02-20: qty 4

## 2015-02-20 MED ORDER — ACETAMINOPHEN 325 MG PO TABS
650.0000 mg | ORAL_TABLET | Freq: Once | ORAL | Status: AC
  Administered 2015-02-20: 650 mg via ORAL

## 2015-02-20 MED ORDER — DIPHENHYDRAMINE HCL 25 MG PO CAPS
ORAL_CAPSULE | ORAL | Status: AC
  Filled 2015-02-20: qty 2

## 2015-02-20 MED ORDER — ACETAMINOPHEN 325 MG PO TABS
ORAL_TABLET | ORAL | Status: AC
  Filled 2015-02-20: qty 2

## 2015-02-20 MED ORDER — SODIUM CHLORIDE 0.9 % IV SOLN
100.0000 mg/m2 | Freq: Once | INTRAVENOUS | Status: AC
  Administered 2015-02-20: 150 mg via INTRAVENOUS
  Filled 2015-02-20: qty 6

## 2015-02-20 MED ORDER — DIPHENHYDRAMINE HCL 25 MG PO CAPS
50.0000 mg | ORAL_CAPSULE | Freq: Once | ORAL | Status: AC
  Administered 2015-02-20: 50 mg via ORAL

## 2015-02-20 MED ORDER — HEPARIN SOD (PORK) LOCK FLUSH 100 UNIT/ML IV SOLN
500.0000 [IU] | Freq: Once | INTRAVENOUS | Status: AC | PRN
  Administered 2015-02-20: 500 [IU]
  Filled 2015-02-20: qty 5

## 2015-02-20 MED ORDER — SODIUM CHLORIDE 0.9 % IV SOLN
Freq: Once | INTRAVENOUS | Status: AC
  Administered 2015-02-20: 09:00:00 via INTRAVENOUS

## 2015-02-20 NOTE — Progress Notes (Signed)
Hematology and Oncology Follow Up Visit  Shannon Parsons [id number] 05/27/1941 74 y.o. 02/20/2015   Principle Diagnosis:   Mantle cell lymphoma (MCIPI = 5)  Thromboembolic event of the right leg  Current Therapy:    Patient s/p cycle #1 of Treanda/Rituxan  Xarelto 20 mg by mouth daily     Interim History:  Shannon Parsons is back for follow-up. She did well with her first cycle of Rituxan/Treanda. I'm thankful for that.  She still is incredibly skeptical at she will ever get better. Even though her last PET scan so that she was in remission, she just has a very negative outlook as to her disease. She gets on the Internet and looked all these websites.  I try to encourage her. I told her that I thought that she was doing currently well. I told her that there is the likelihood that her mantle cell will come back for that if it does, we certainly could offer her other treatment options.  Her left eye is doing great. She had a terrible cellulitis secondary to her initial course of chemotherapy. She is incredibly neutropenic which did not help the healing process.  She's had some leg pain. She does have the thromboembolic event in the right leg. I will repeat her Doppler test when we get her back.  Her appetite seems be doing okay. She's not had any nausea or vomiting. He's had no bleeding. She's had no diarrhea.  Overall, I would say her performance status is ECOG 1-2. i  Medications:  Current outpatient prescriptions:  .  acetaminophen (TYLENOL) 325 MG tablet, Take 650 mg by mouth every 6 (six) hours as needed (Pain)., Disp: , Rfl:  .  Cholecalciferol (VITAMIN D) 2000 UNITS CAPS, Take 2,000-4,000 capsules by mouth every other day. Patient alternates 1 cap with 2 caps every other day., Disp: , Rfl:  .  hyoscyamine (LEVSIN, ANASPAZ) 0.125 MG tablet, Take 1 to 2 tablets every 4 hours as needed for nausea, cramping, bloating or diarrhea, Disp: 90 tablet, Rfl: 11 .  levothyroxine  (SYNTHROID, LEVOTHROID) 50 MCG tablet, TAKE 1 TABLET BY MOUTH ONCE DAILY. (Patient taking differently: 25 mg daily), Disp: 90 tablet, Rfl: 0 .  lidocaine-prilocaine (EMLA) cream, Apply 1 application topically as needed. Apply to [facility] one hour before access., Disp: 30 g, Rfl: 3 .  LORazepam (ATIVAN) 0.5 MG tablet, Take 1 tablet (0.5 mg total) by mouth every 6 (six) hours as needed (Nausea or vomiting)., Disp: 30 tablet, Rfl: 0 .  Magnesium 200 MG TABS, Take 1 tablet by mouth every morning. , Disp: , Rfl:  .  ondansetron (ZOFRAN) 8 MG tablet, Take 1 tablet (8 mg total) by mouth 2 (two) times daily. Start the day after chemo for 2 days. Then take as needed for nausea or vomiting., Disp: 30 tablet, Rfl: 1 .  PRESCRIPTION MEDICATION, Chemo [facility], Disp: , Rfl:  .  Probiotic Product (PROBIOTIC DAILY PO), Take 1 capsule by mouth daily., Disp: , Rfl:  .  prochlorperazine (COMPAZINE) 10 MG tablet, Take 1 tablet (10 mg total) by mouth every 6 (six) hours as needed (Nausea or vomiting)., Disp: 30 tablet, Rfl: 1 .  QVAR 80 MCG/ACT inhaler, INHALE 2 PUFFS INTO THE LUNGS 2 TIMES DAILY. (Patient taking differently: INHALE 1 PUFFS INTO THE LUNGS 2 TIMES DAILY.), Disp: 8.7 g, Rfl: 6 .  ranitidine (ZANTAC) 300 MG tablet, Take 300 mg by mouth at bedtime., Disp: , Rfl:  .  rivaroxaban (XARELTO) 20 MG  TABS tablet, Take 1 tablet (20 mg total) by mouth daily with supper., Disp: 30 tablet, Rfl: 4  Allergies:  Allergies  Allergen Reactions  . Actonel [Risedronate Sodium]     "made me choke"   . Fosamax [Alendronate]     "made me choke"  . Sulfa Antibiotics     Doesn't remember   . Tetracyclines & Related Rash    Past Medical History, Surgical history, Social history, and Family History were reviewed and updated.  Review of Systems: As above  Physical Exam:  weight is 122 lb (55.339 kg). Her oral temperature is 97.7 F (36.5 C). Her blood pressure is 122/78 and her pulse is 82.   Well-developed and  well-nourished white female. Head and neck exam shows no ocular or oral lesions. She has no bilateral cervical or supraclavicular lymph nodes. She has no palpable thyroid. Lungs are clear. Cardiac exam regular rate and rhythm with no murmurs, rubs or bruits. Axillary exam shows no obvious bilateral axillary adenopathy. Abdomen is soft. She has decent bowel sounds. There is no fluid wave. Her spleen tip is just below the left costal margin. There is no hepatomegaly. Back exam shows no tenderness over the spine, ribs or hips. Extremities shows no clubbing, cyanosis or edema. No palpable venous cord is noted in the right leg. She has good range of motion of her joints. She has good strength in her muscles. Neurological exam is nonfocal. Skin exam shows no rashes, ecchymoses or petechia  Lab Results  Component Value Date   WBC 4.2 02/20/2015   HGB 12.6 02/20/2015   HCT 37.6 02/20/2015   MCV 95 02/20/2015   PLT 154 02/20/2015     Chemistry      Component Value Date/Time   NA 140 02/20/2015 0813   NA 134* 02/06/2015 0948   K 4.1 02/20/2015 0813   K 4.5 02/06/2015 0948   CL 101 02/20/2015 0813   CL 101 02/06/2015 0948   CO2 27 02/20/2015 0813   CO2 26 02/06/2015 0948   BUN 11 02/20/2015 0813   BUN 12 02/06/2015 0948   CREATININE 0.8 02/20/2015 0813   CREATININE 0.74 02/06/2015 0948      Component Value Date/Time   CALCIUM 10.0 02/20/2015 0813   CALCIUM 9.6 02/06/2015 0948   ALKPHOS 47 02/20/2015 0813   ALKPHOS 48 02/06/2015 0948   AST 32 02/20/2015 0813   AST 26 02/06/2015 0948   ALT 24 02/20/2015 0813   ALT 16 02/06/2015 0948   BILITOT 0.70 02/20/2015 0813   BILITOT 0.4 02/06/2015 0948         Impression and Plan: Shannon Parsons is 74 year old white female. She has mantle cell lymphoma. Again, by the [facility] she is an intermediate risk.  We will go ahead with her second cycle of Rituxan/Treanda. I will then repeat her PET scan after this cycle. I also will get a Doppler of her  right leg.  I have not totally discounted the possibility of a stem cell transplant. I think this would be a "stretch" given her overall performance status. I'm just not sure that she would have the necessary physical ability to handle a stem cell transplant.  I'm just grateful that she is doing as well as she is doing and that she is responded as well as she has responded.  I will plan to get her back here in another month. We will get the PET scan and Doppler in between visits.  Thankfully, her husband is incredibly  encouraging and trying to keep her positive.. I spent about 45 minutes with she and her husband.   Volanda Napoleon, MD 6/9/20166:06 PM

## 2015-02-20 NOTE — Patient Instructions (Signed)
Bendamustine Injection What is this medicine? BENDAMUSTINE (BEN da MUS teen) is a chemotherapy drug. It is used to treat chronic lymphocytic leukemia and non-Hodgkin lymphoma. This medicine may be used for other purposes; ask your health care provider or pharmacist if you have questions. COMMON BRAND NAME(S): Treanda What should I tell my health care provider before I take this medicine? They need to know if you have any of these conditions: -kidney disease -liver disease -an unusual or allergic reaction to bendamustine, mannitol, other medicines, foods, dyes, or preservatives -pregnant or trying to get pregnant -breast-feeding How should I use this medicine? This medicine is for infusion into a vein. It is given by a health care professional in a hospital or clinic setting. Talk to your pediatrician regarding the use of this medicine in children. Special care may be needed. Overdosage: If you think you have taken too much of this medicine contact a poison control center or emergency room at once. NOTE: This medicine is only for you. Do not share this medicine with others. What if I miss a dose? It is important not to miss your dose. Call your doctor or health care professional if you are unable to keep an appointment. What may interact with this medicine? Do not take this medicine with any of the following medications: -clozapine This medicine may also interact with the following medications: -atazanavir -cimetidine -ciprofloxacin -enoxacin -fluvoxamine -medicines for seizures like carbamazepine and phenobarbital -mexiletine -rifampin -tacrine -thiabendazole -zileuton This list may not describe all possible interactions. Give your health care provider a list of all the medicines, herbs, non-prescription drugs, or dietary supplements you use. Also tell them if you smoke, drink alcohol, or use illegal drugs. Some items may interact with your medicine. What should I watch for while  using this medicine? Your condition will be monitored carefully while you are receiving this medicine. This drug may make you feel generally unwell. This is not uncommon, as chemotherapy can affect healthy cells as well as cancer cells. Report any side effects. Continue your course of treatment even though you feel ill unless your doctor tells you to stop. Call your doctor or health care professional for advice if you get a fever, chills or sore throat, or other symptoms of a cold or flu. Do not treat yourself. This drug decreases your body's ability to fight infections. Try to avoid being around people who are sick. This medicine may increase your risk to bruise or bleed. Call your doctor or health care professional if you notice any unusual bleeding. Be careful brushing and flossing your teeth or using a toothpick because you may get an infection or bleed more easily. If you have any dental work done, tell your dentist you are receiving this medicine. Avoid taking products that contain aspirin, acetaminophen, ibuprofen, naproxen, or ketoprofen unless instructed by your doctor. These medicines may hide a fever. Do not become pregnant while taking this medicine. Women should inform their doctor if they wish to become pregnant or think they might be pregnant. There is a potential for serious side effects to an unborn child. Men should inform their doctors if they wish to father a child. This medicine may lower sperm counts. Talk to your health care professional or pharmacist for more information. Do not breast-feed an infant while taking this medicine. What side effects may I notice from receiving this medicine? Side effects that you should report to your doctor or health care professional as soon as possible: -allergic reactions like skin rash,  itching or hives, swelling of the face, lips, or tongue -low blood counts - this medicine may decrease the number of white blood cells, red blood cells and  platelets. You may be at increased risk for infections and bleeding. -signs of infection - fever or chills, cough, sore throat, pain or difficulty passing urine -signs of decreased platelets or bleeding - bruising, pinpoint red spots on the skin, black, tarry stools, blood in the urine -signs of decreased red blood cells - unusually weak or tired, fainting spells, lightheadedness -trouble passing urine or change in the amount of urine Side effects that usually do not require medical attention (report to your doctor or health care professional if they continue or are bothersome): -diarrhea This list may not describe all possible side effects. Call your doctor for medical advice about side effects. You may report side effects to FDA at 1-800-FDA-1088. Where should I keep my medicine? This drug is given in a hospital or clinic and will not be stored at home. NOTE: This sheet is a summary. It may not cover all possible information. If you have questions about this medicine, talk to your doctor, pharmacist, or health care provider.  2015, Elsevier/Gold Standard. (2011-11-26 14:15:47) [facility] Discharge Instructions for Patients Receiving Chemotherapy  Today you received the following chemotherapy agents: Bendeka and Rituxan  To help prevent nausea and vomiting after your treatment, we encourage you to take your nausea medications as written on the bottle.  I have called in Zofran, Compazine, and I am giving you the Rx for Ativan.     If you develop nausea and vomiting that is not controlled by your nausea medication, call the clinic.   BELOW ARE SYMPTOMS THAT SHOULD BE REPORTED IMMEDIATELY:  *FEVER GREATER THAN 100.5 F  *CHILLS WITH OR WITHOUT FEVER  NAUSEA AND VOMITING THAT IS NOT CONTROLLED WITH YOUR NAUSEA MEDICATION  *UNUSUAL SHORTNESS OF BREATH  *UNUSUAL BRUISING OR BLEEDING  TENDERNESS IN MOUTH AND THROAT WITH OR WITHOUT PRESENCE OF ULCERS  *URINARY  PROBLEMS  *BOWEL PROBLEMS  UNUSUAL RASH Items with * indicate a potential emergency and should be followed up as soon as possible.  Feel free to call the clinic you have any questions or concerns. The clinic phone number is [phone].  Please show the [facility] at check-in to the Emergency Department and triage nurse.

## 2015-02-21 ENCOUNTER — Telehealth (HOSPITAL_COMMUNITY): Payer: Self-pay | Admitting: *Deleted

## 2015-02-21 ENCOUNTER — Emergency Department (HOSPITAL_COMMUNITY): Payer: Medicare Other

## 2015-02-21 ENCOUNTER — Other Ambulatory Visit: Payer: Self-pay

## 2015-02-21 ENCOUNTER — Ambulatory Visit: Payer: Medicare Other

## 2015-02-21 ENCOUNTER — Encounter: Payer: Self-pay | Admitting: *Deleted

## 2015-02-21 ENCOUNTER — Observation Stay (HOSPITAL_COMMUNITY): Payer: Medicare Other

## 2015-02-21 ENCOUNTER — Encounter (HOSPITAL_COMMUNITY): Payer: Self-pay | Admitting: *Deleted

## 2015-02-21 ENCOUNTER — Other Ambulatory Visit: Payer: Self-pay | Admitting: Nurse Practitioner

## 2015-02-21 ENCOUNTER — Observation Stay (HOSPITAL_COMMUNITY)
Admission: EM | Admit: 2015-02-21 | Discharge: 2015-02-22 | Disposition: A | Discharge status: Home / Self Care | Payer: Medicare Other | Attending: Internal Medicine | Admitting: Internal Medicine

## 2015-02-21 DIAGNOSIS — K449 Diaphragmatic hernia without obstruction or gangrene: Secondary | ICD-10-CM | POA: Insufficient documentation

## 2015-02-21 DIAGNOSIS — I959 Hypotension, unspecified: Secondary | ICD-10-CM | POA: Diagnosis present

## 2015-02-21 DIAGNOSIS — G4733 Obstructive sleep apnea (adult) (pediatric): Secondary | ICD-10-CM | POA: Insufficient documentation

## 2015-02-21 DIAGNOSIS — Z86010 Personal history of colonic polyps: Secondary | ICD-10-CM | POA: Insufficient documentation

## 2015-02-21 DIAGNOSIS — Z7901 Long term (current) use of anticoagulants: Secondary | ICD-10-CM | POA: Diagnosis not present

## 2015-02-21 DIAGNOSIS — E039 Hypothyroidism, unspecified: Secondary | ICD-10-CM | POA: Diagnosis not present

## 2015-02-21 DIAGNOSIS — K5790 Diverticulosis of intestine, part unspecified, without perforation or abscess without bleeding: Secondary | ICD-10-CM | POA: Diagnosis not present

## 2015-02-21 DIAGNOSIS — D696 Thrombocytopenia, unspecified: Secondary | ICD-10-CM

## 2015-02-21 DIAGNOSIS — J449 Chronic obstructive pulmonary disease, unspecified: Secondary | ICD-10-CM | POA: Insufficient documentation

## 2015-02-21 DIAGNOSIS — R7989 Other specified abnormal findings of blood chemistry: Secondary | ICD-10-CM | POA: Diagnosis not present

## 2015-02-21 DIAGNOSIS — R509 Fever, unspecified: Secondary | ICD-10-CM | POA: Diagnosis present

## 2015-02-21 DIAGNOSIS — I10 Essential (primary) hypertension: Secondary | ICD-10-CM | POA: Insufficient documentation

## 2015-02-21 DIAGNOSIS — M1990 Unspecified osteoarthritis, unspecified site: Secondary | ICD-10-CM | POA: Insufficient documentation

## 2015-02-21 DIAGNOSIS — C8310 Mantle cell lymphoma, unspecified site: Secondary | ICD-10-CM | POA: Insufficient documentation

## 2015-02-21 DIAGNOSIS — E785 Hyperlipidemia, unspecified: Secondary | ICD-10-CM | POA: Insufficient documentation

## 2015-02-21 DIAGNOSIS — Z7951 Long term (current) use of inhaled steroids: Secondary | ICD-10-CM | POA: Diagnosis not present

## 2015-02-21 DIAGNOSIS — J45909 Unspecified asthma, uncomplicated: Secondary | ICD-10-CM | POA: Diagnosis not present

## 2015-02-21 DIAGNOSIS — R778 Other specified abnormalities of plasma proteins: Secondary | ICD-10-CM

## 2015-02-21 DIAGNOSIS — I82401 Acute embolism and thrombosis of unspecified deep veins of right lower extremity: Secondary | ICD-10-CM

## 2015-02-21 DIAGNOSIS — Z79899 Other long term (current) drug therapy: Secondary | ICD-10-CM | POA: Insufficient documentation

## 2015-02-21 DIAGNOSIS — R748 Abnormal levels of other serum enzymes: Secondary | ICD-10-CM | POA: Insufficient documentation

## 2015-02-21 DIAGNOSIS — Z86718 Personal history of other venous thrombosis and embolism: Secondary | ICD-10-CM | POA: Diagnosis not present

## 2015-02-21 DIAGNOSIS — K219 Gastro-esophageal reflux disease without esophagitis: Secondary | ICD-10-CM | POA: Insufficient documentation

## 2015-02-21 LAB — URINALYSIS, ROUTINE W REFLEX MICROSCOPIC
Bilirubin Urine: NEGATIVE
GLUCOSE, UA: NEGATIVE mg/dL
KETONES UR: NEGATIVE mg/dL
NITRITE: NEGATIVE
PROTEIN: 30 mg/dL — AB
SPECIFIC GRAVITY, URINE: 1.019 (ref 1.005–1.030)
Urobilinogen, UA: 0.2 mg/dL (ref 0.0–1.0)
pH: 6 (ref 5.0–8.0)

## 2015-02-21 LAB — BASIC METABOLIC PANEL
ANION GAP: 9 (ref 5–15)
BUN: 15 mg/dL (ref 6–20)
CALCIUM: 9.2 mg/dL (ref 8.9–10.3)
CHLORIDE: 100 mmol/L — AB (ref 101–111)
CO2: 23 mmol/L (ref 22–32)
Creatinine, Ser: 0.87 mg/dL (ref 0.44–1.00)
GFR calc Af Amer: >60 mL/min (ref >60)
Glucose, Bld: 128 mg/dL — ABNORMAL HIGH (ref 65–99)
Potassium: 3.7 mmol/L (ref 3.5–5.1)
Sodium: 132 mmol/L — ABNORMAL LOW (ref 135–145)

## 2015-02-21 LAB — CBC WITH DIFFERENTIAL/PLATELET
Basophils Absolute: 0 10*3/uL (ref 0.0–0.1)
Basophils Relative: 0 % (ref 0–1)
EOS PCT: 13 % — AB (ref 0–5)
Eosinophils Absolute: 1.5 10*3/uL — ABNORMAL HIGH (ref 0.0–0.7)
HEMATOCRIT: 31.8 % — AB (ref 36.0–46.0)
Hemoglobin: 10.4 g/dL — ABNORMAL LOW (ref 12.0–15.0)
Lymphocytes Relative: 2 % — ABNORMAL LOW (ref 12–46)
Lymphs Abs: 0.2 10*3/uL — ABNORMAL LOW (ref 0.7–4.0)
MCH: 30.8 pg (ref 26.0–34.0)
MCHC: 32.7 g/dL (ref 30.0–36.0)
MCV: 94.1 fL (ref 78.0–100.0)
Monocytes Absolute: 0.9 10*3/uL (ref 0.1–1.0)
Monocytes Relative: 8 % (ref 3–12)
NEUTROS ABS: 8.5 10*3/uL — AB (ref 1.7–7.7)
Neutrophils Relative %: 77 % (ref 43–77)
Platelets: 127 10*3/uL — ABNORMAL LOW (ref 150–400)
RBC: 3.38 MIL/uL — ABNORMAL LOW (ref 3.87–5.11)
RDW: 15 % (ref 11.5–15.5)
WBC: 11.1 10*3/uL — ABNORMAL HIGH (ref 4.0–10.5)

## 2015-02-21 LAB — I-STAT CG4 LACTIC ACID, ED: Lactic Acid, Venous: 2.03 mmol/L (ref 0.5–2.0)

## 2015-02-21 LAB — URINE MICROSCOPIC-ADD ON

## 2015-02-21 LAB — CK: Total CK: 43 U/L (ref 38–234)

## 2015-02-21 LAB — BRAIN NATRIURETIC PEPTIDE: B Natriuretic Peptide: 271.1 pg/mL — ABNORMAL HIGH (ref 0.0–100.0)

## 2015-02-21 LAB — I-STAT TROPONIN, ED: TROPONIN I, POC: 0.29 ng/mL — AB (ref 0.00–0.08)

## 2015-02-21 LAB — TROPONIN I
Troponin I: 0.36 ng/mL — ABNORMAL HIGH (ref <0.031)
Troponin I: 0.3 ng/mL — ABNORMAL HIGH (ref <0.031)

## 2015-02-21 MED ORDER — ONDANSETRON HCL 4 MG PO TABS: 4.0000 mg | ORAL_TABLET | Freq: Four times a day (QID) | ORAL | Status: DC | PRN

## 2015-02-21 MED ORDER — SODIUM CHLORIDE 0.9 % IV SOLN
INTRAVENOUS | Status: DC
  Administered 2015-02-22: 03:00:00 via INTRAVENOUS

## 2015-02-21 MED ORDER — HYOSCYAMINE SULFATE 0.125 MG PO TABS
0.1250 mg | ORAL_TABLET | ORAL | Status: DC | PRN
  Filled 2015-02-21: qty 1

## 2015-02-21 MED ORDER — SODIUM CHLORIDE 0.9 % IV SOLN: INTRAVENOUS | Status: DC

## 2015-02-21 MED ORDER — BECLOMETHASONE DIPROPIONATE 80 MCG/ACT IN AERS
1.0000 | INHALATION_SPRAY | Freq: Two times a day (BID) | RESPIRATORY_TRACT | Status: DC
Start: 2015-02-21 — End: 2015-02-21

## 2015-02-21 MED ORDER — SODIUM CHLORIDE 0.9 % IJ SOLN: 3.0000 mL | Freq: Two times a day (BID) | INTRAMUSCULAR | Status: DC

## 2015-02-21 MED ORDER — ACETAMINOPHEN 325 MG PO TABS: 325.0000 mg | ORAL_TABLET | Freq: Four times a day (QID) | ORAL | Status: DC | PRN

## 2015-02-21 MED ORDER — HYDROCODONE-ACETAMINOPHEN 5-325 MG PO TABS: 1.0000 | ORAL_TABLET | ORAL | Status: DC | PRN

## 2015-02-21 MED ORDER — ONDANSETRON HCL 8 MG PO TABS
8.0000 mg | ORAL_TABLET | Freq: Two times a day (BID) | ORAL | Status: DC
  Filled 2015-02-21 (×2): qty 1

## 2015-02-21 MED ORDER — SODIUM CHLORIDE 0.9 % IV BOLUS (SEPSIS)
1000.0000 mL | Freq: Once | INTRAVENOUS | Status: AC
  Administered 2015-02-21: 1000 mL via INTRAVENOUS

## 2015-02-21 MED ORDER — BISACODYL 10 MG RE SUPP: 10.0000 mg | Freq: Every day | RECTAL | Status: DC | PRN

## 2015-02-21 MED ORDER — LORAZEPAM 0.5 MG PO TABS: 0.5000 mg | ORAL_TABLET | Freq: Four times a day (QID) | ORAL | Status: DC | PRN

## 2015-02-21 MED ORDER — BUDESONIDE 0.25 MG/2ML IN SUSP
0.2500 mg | Freq: Two times a day (BID) | RESPIRATORY_TRACT | Status: DC
  Filled 2015-02-21: qty 2

## 2015-02-21 MED ORDER — POLYETHYLENE GLYCOL 3350 17 G PO PACK: 17.0000 g | PACK | Freq: Every day | ORAL | Status: DC | PRN

## 2015-02-21 MED ORDER — LEVOTHYROXINE SODIUM 25 MCG PO TABS
50.0000 ug | ORAL_TABLET | Freq: Every day | ORAL | Status: DC
  Administered 2015-02-22: 50 ug via ORAL
  Filled 2015-02-21: qty 2

## 2015-02-21 MED ORDER — SODIUM CHLORIDE 0.9 % IJ SOLN: 10.0000 mL | INTRAMUSCULAR | Status: DC | PRN

## 2015-02-21 MED ORDER — RIVAROXABAN 20 MG PO TABS
20.0000 mg | ORAL_TABLET | Freq: Every day | ORAL | Status: DC
  Administered 2015-02-21: 20 mg via ORAL
  Filled 2015-02-21: qty 1

## 2015-02-21 MED ORDER — PROCHLORPERAZINE MALEATE 10 MG PO TABS: 10.0000 mg | ORAL_TABLET | Freq: Four times a day (QID) | ORAL | Status: DC | PRN

## 2015-02-21 MED ORDER — ACETAMINOPHEN 325 MG PO TABS
650.0000 mg | ORAL_TABLET | Freq: Once | ORAL | Status: AC
  Administered 2015-02-21: 650 mg via ORAL
  Filled 2015-02-21: qty 2

## 2015-02-21 MED ORDER — ONDANSETRON HCL 4 MG/2ML IJ SOLN: 4.0000 mg | Freq: Four times a day (QID) | INTRAMUSCULAR | Status: DC | PRN

## 2015-02-21 NOTE — Progress Notes (Signed)
  Echocardiogram 2D Echocardiogram has been performed.  Joelene Millin 02/21/2015, 4:11 PM

## 2015-02-21 NOTE — ED Notes (Signed)
Pt sts she would like to avoid blood draws, has yesterday's results with her, and if necessary she would like PIV instead of accessing her port.

## 2015-02-21 NOTE — Consult Note (Signed)
Referral MD  Reason for Referral: Mantle cell lymphoma   Chief Complaint  Patient presents with  . Fever    cancer pt  . Shaking  : I had fevers and chest discomfort.  HPI: Shannon Parsons is well-known to me. She is a 74 year old female. I saw her yesterday in the office. She has Mantle cell lymphoma. She is on chemotherapy with Rituxan/Treanda. She had good labs. She tolerated treatment well.  This morning, she began to have some temperatures. She had no cough. She had some chills. Patient went to the emergency room. She had chest x-ray which was unremarkable.  She had elevated troponins. Because of this, she was admitted for evaluation of a cardiac etiology.  Her labs look okay.  She feels okay. Cultures were taken.  She's had no diarrhea. She's had no rashes. She's had no leg swelling.  She was hospitalized in May with a terrible cellulitis of the left periorbital region. This has not been an issue.  We were notified of her admission so we might be able to help out with any issues that might arise.    Past Medical History  Diagnosis Date  . OSA (obstructive sleep apnea)   . Hiatal hernia   . IBS (irritable bowel syndrome)   . Fibrocystic breast disease   . Asthma   . Diverticulosis   . Adenomatous colon polyp 1994  . Internal hemorrhoids   . Melanoma     Facial  . Hyperlipidemia   . Hypertension   . GERD (gastroesophageal reflux disease)   . Osteoporosis   . Osteoarthritis   . Vitamin D deficiency   . Prediabetes   . Unspecified hypothyroidism   . Family history of ischemic heart disease   . Mantle cell lymphoma 10/18/2014  :  Past Surgical History  Procedure Laterality Date  . Appendectomy    . Tonsillectomy    . Bunionectomy    . Knee arthroscopy    . Umbilical hernia repair    :   Current facility-administered medications:  .  0.9 %  sodium chloride infusion, , Intravenous, Continuous, Dawood S Elgergawy, MD .  0.9 %  sodium chloride infusion, ,  Intravenous, Continuous, Albertine Patricia, MD, 75 mL/hr at 02/21/15 1526 .  acetaminophen (TYLENOL) tablet 325 mg, 325 mg, Oral, Q6H PRN, Albertine Patricia, MD .  bisacodyl (DULCOLAX) suppository 10 mg, 10 mg, Rectal, Daily PRN, Silver Huguenin Elgergawy, MD .  budesonide (PULMICORT) nebulizer solution 0.25 mg, 0.25 mg, Nebulization, BID, Silver Huguenin Elgergawy, MD .  HYDROcodone-acetaminophen (NORCO/VICODIN) 5-325 MG per tablet 1-2 tablet, 1-2 tablet, Oral, Q4H PRN, Silver Huguenin Elgergawy, MD .  hyoscyamine (LEVSIN, ANASPAZ) tablet 0.125 mg, 0.125 mg, Oral, Q4H PRN, Albertine Patricia, MD .  Derrill Memo ON 02/22/2015] levothyroxine (SYNTHROID, LEVOTHROID) tablet 50 mcg, 50 mcg, Oral, QAC breakfast, Dawood S Elgergawy, MD .  LORazepam (ATIVAN) tablet 0.5 mg, 0.5 mg, Oral, Q6H PRN, Silver Huguenin Elgergawy, MD .  ondansetron (ZOFRAN) tablet 4 mg, 4 mg, Oral, Q6H PRN **OR** ondansetron (ZOFRAN) injection 4 mg, 4 mg, Intravenous, Q6H PRN, Dawood S Elgergawy, MD .  ondansetron (ZOFRAN) tablet 8 mg, 8 mg, Oral, BID, Dawood S Elgergawy, MD .  polyethylene glycol (MIRALAX / GLYCOLAX) packet 17 g, 17 g, Oral, Daily PRN, Albertine Patricia, MD .  prochlorperazine (COMPAZINE) tablet 10 mg, 10 mg, Oral, Q6H PRN, Silver Huguenin Elgergawy, MD .  rivaroxaban (XARELTO) tablet 20 mg, 20 mg, Oral, Q supper, [employer],  MD, 20 mg at 02/21/15 1718 .  sodium chloride 0.9 % injection 3 mL, 3 mL, Intravenous, Q12H, Dawood S Elgergawy, MD:  . budesonide (PULMICORT) nebulizer solution  0.25 mg Nebulization BID  . [START ON 02/22/2015] levothyroxine  50 mcg Oral QAC breakfast  . ondansetron  8 mg Oral BID  . rivaroxaban  20 mg Oral Q supper  . sodium chloride  3 mL Intravenous Q12H  :  Allergies  Allergen Reactions  . Actonel [Risedronate Sodium] Other (See Comments)    "made me choke"   . Fosamax [Alendronate] Other (See Comments)    "made me choke"  . Sulfa Antibiotics Other (See Comments)    Doesn't remember   . Tetracyclines &  Related Rash  :  Family History  Problem Relation Age of Onset  . Emphysema Father   . Heart disease Father   . Heart attack Father 26    Died  . Heart disease Brother   . Rheum arthritis Sister   . Hypertension Mother   . Thyroid disease Mother   . Colon cancer Neg Hx   :  History   Social History  . Marital Status: Married    Spouse Name: N/A  . Number of Children: N/A  . Years of Education: N/A   Occupational History  . retired from office work    Social History Main Topics  . Smoking status: Never Smoker   . Smokeless tobacco: Never Used     Comment: Never Used Tobacco  . Alcohol Use: No  . Drug Use: No  . Sexual Activity: Not on file   Other Topics Concern  . Not on file   Social History Narrative  :  Pertinent items are noted in HPI.  Exam: Patient Vitals for the past 24 hrs:  BP Temp Temp src Pulse Resp SpO2 Height Weight  02/21/15 1530 (!) 96/53 mmHg 100 F (37.8 C) Oral 82 18 100 % 5\' 4"  (1.626 m) 127 lb (57.607 kg)  02/21/15 1330 118/56 mmHg 99.1 F (37.3 C) - - - 98 % - -  02/21/15 1300 (!) 115/54 mmHg - - - - - - -  02/21/15 1230 141/66 mmHg - - - - - - -  02/21/15 1200 (!) 123/50 mmHg - - 95 15 97 % - -  02/21/15 1130 128/62 mmHg - - 100 22 96 % - -  02/21/15 1100 (!) 121/54 mmHg - - 98 20 97 % - -  02/21/15 1030 130/57 mmHg - - 96 21 98 % - -  02/21/15 1004 123/61 mmHg 100.6 F (38.1 C) Oral 98 20 96 % - -  02/21/15 0819 128/62 mmHg 98.2 F (36.8 C) Oral (!) 122 20 95 % - -   See above.    Recent Labs  02/20/15 0813 02/21/15 1100  WBC 4.2 11.1*  HGB 12.6 10.4*  HCT 37.6 31.8*  PLT 154 127*    Recent Labs  02/20/15 0813 02/21/15 0852  NA 140 132*  K 4.1 3.7  CL 101 100*  CO2 27 23  GLUCOSE 78 128*  BUN 11 15  CREATININE 0.8 0.87  CALCIUM 10.0 9.2    Blood smear review: None  Pathology: None     Assessment and Plan: Shannon Parsons is a 74 year old female. I don't think she has any type of cardiac history. Which  she is getting treatment wise, she not cause any cardiac problems. She tolerated her first cycle very well.  I can't hear anything specific  that needs to be done for her. I don't think she needs any empiric antibiotic therapy. Her immune system is doing okay.  From a point of view with her lymphoma, she has done quite well with this.  I appreciate the outstanding care that she is getting up on 4 W.  We will follow along as necessary.  She had an echocardiogram done today. This looked okay with a left ventricular ejection fraction of 60%.  Again, she hopefully will be able to go home over the weekend. If so, we can give her day #2 of cycle #2 of treatment next week.  She is setup for her restaging scans in about 3 weeks.  [name].

## 2015-02-21 NOTE — ED Notes (Signed)
Husband taking belongings

## 2015-02-21 NOTE — ED Notes (Signed)
Pt did not want blood drawn for lactic acid, PA notified

## 2015-02-21 NOTE — ED Notes (Signed)
Admitting RN at bedside.  

## 2015-02-21 NOTE — ED Notes (Signed)
Pt given saltine crackers and coke.

## 2015-02-21 NOTE — ED Provider Notes (Signed)
CSN: [id number]     Arrival date & time 02/21/15  0813 History   First MD Initiated Contact with Patient 02/21/15 [phone]     Chief Complaint  Patient presents with  . Fever    cancer pt  . Shaking     (Consider location/radiation/quality/duration/timing/severity/associated sxs/prior Treatment) HPI Shannon Parsons is a 74 y.o. female with mantle cell lymphoma, chemotherapy yesterday, comes in for evaluation of fever and generally not feeling well. Patient states she "felt the worst I felt after chemotherapy yesterday". She reports associated shaking after chemotherapy sessions, but today shaking is a little worse. Patient states last night at approximately 2 AM she was not feeling well, took her temperature and noted it to be 101.8. She reports taking Tylenol and the fever reducing to 100.8. She cannot further clarify "not feeling well". She reports a mild cough, productive yellow sputum. She denies chest pain, shortness of breath, headache, neck pain or neck stiffness, numbness or weakness. She reports mild abdominal discomfort, denies urinary symptoms, pelvic pain, vaginal bleeding or discharge. Reports she was diagnosed with UTI 2 weeks ago and finished Cipro one week ago. Denies diarrhea or constipation. Reports associated nausea without vomiting.  Patient also on Xarelto therapy for previously diagnosed DVT.  Past Medical History  Diagnosis Date  . OSA (obstructive sleep apnea)   . Hiatal hernia   . IBS (irritable bowel syndrome)   . Fibrocystic breast disease   . Asthma   . Diverticulosis   . Adenomatous colon polyp 1994  . Internal hemorrhoids   . Melanoma     Facial  . Hyperlipidemia   . Hypertension   . GERD (gastroesophageal reflux disease)   . Osteoporosis   . Osteoarthritis   . Vitamin D deficiency   . Prediabetes   . Unspecified hypothyroidism   . Family history of ischemic heart disease   . Mantle cell lymphoma 10/18/2014   Past Surgical History  Procedure  Laterality Date  . Appendectomy    . Tonsillectomy    . Bunionectomy    . Knee arthroscopy    . Umbilical hernia repair     Family History  Problem Relation Age of Onset  . Emphysema Father   . Heart disease Father   . Heart attack Father 63    Died  . Heart disease Brother   . Rheum arthritis Sister   . Hypertension Mother   . Thyroid disease Mother   . Colon cancer Neg Hx    History  Substance Use Topics  . Smoking status: Never Smoker   . Smokeless tobacco: Never Used     Comment: Never Used Tobacco  . Alcohol Use: No   OB History    No data available     Review of Systems A 10 point review of systems was completed and was negative except for pertinent positives and negatives as mentioned in the history of present illness     Allergies  Actonel; Fosamax; Sulfa antibiotics; and Tetracyclines & related  Home Medications   Prior to Admission medications   Medication Sig Start Date End Date Taking? Authorizing Provider  acetaminophen (TYLENOL) 325 MG tablet Take 325 mg by mouth every 6 (six) hours as needed (For pain.).    Yes Historical Provider, MD  beclomethasone (QVAR) 80 MCG/ACT inhaler Inhale 1 puff into the lungs 2 (two) times daily.    Yes Historical Provider, MD  Cholecalciferol (VITAMIN D) 2000 UNITS CAPS Take 2,000-4,000 capsules by mouth every other day. Patient  alternates 1 cap with 2 caps every other day.   Yes Historical Provider, MD  hyoscyamine (LEVSIN, ANASPAZ) 0.125 MG tablet Take 1 to 2 tablets every 4 hours as needed for nausea, cramping, bloating or diarrhea 08/19/14  Yes Unk Pinto, MD  levothyroxine (SYNTHROID, LEVOTHROID) 50 MCG tablet Take 50 mcg by mouth daily before breakfast.   Yes Historical Provider, MD  lidocaine-prilocaine (EMLA) cream Apply 1 application topically as needed. Apply to [facility] one hour before access. 10/17/14  Yes Volanda Napoleon, MD  LORazepam (ATIVAN) 0.5 MG tablet Take 1 tablet (0.5 mg total) by mouth every 6 (six)  hours as needed (Nausea or vomiting). 01/23/15  Yes Volanda Napoleon, MD  ondansetron (ZOFRAN) 8 MG tablet Take 1 tablet (8 mg total) by mouth 2 (two) times daily. Start the day after chemo for 2 days. Then take as needed for nausea or vomiting. 01/23/15  Yes Volanda Napoleon, MD  PRESCRIPTION MEDICATION Patient receives her treatments at the [facility] with Dr Marin Olp. Her last dose of Bendeka 100mg  and Rituxan 600mg  was on 02/20/15.   Yes Historical Provider, MD  Probiotic Product (PROBIOTIC DAILY PO) Take 1 capsule by mouth daily.   Yes Historical Provider, MD  prochlorperazine (COMPAZINE) 10 MG tablet Take 1 tablet (10 mg total) by mouth every 6 (six) hours as needed (Nausea or vomiting). 01/23/15  Yes Volanda Napoleon, MD  ranitidine (ZANTAC) 300 MG tablet Take 300 mg by mouth daily as needed for heartburn.    Yes Unk Pinto, MD  rivaroxaban (XARELTO) 20 MG TABS tablet Take 1 tablet (20 mg total) by mouth daily with supper. 02/06/15  Yes Ashton, NP   BP 118/56 mmHg  Pulse 95  Temp(Src) 99.1 F (37.3 C) (Oral)  Resp 15  SpO2 98%  LMP 09/14/2014 Physical Exam  Constitutional: She is oriented to person, place, and time. She appears well-developed and well-nourished.  HENT:  Head: Normocephalic and atraumatic.  Mouth/Throat: Oropharynx is clear and moist. No oropharyngeal exudate.  Eyes: Conjunctivae are normal. Pupils are equal, round, and reactive to light. Right eye exhibits no discharge. Left eye exhibits no discharge. No scleral icterus.  Neck: Normal range of motion. Neck supple.  Cardiovascular: Normal rate, regular rhythm and normal heart sounds.   Pulmonary/Chest: Effort normal and breath sounds normal. No respiratory distress. She has no wheezes. She has no rales.  Abdominal: Soft.  Abdomen is soft, nondistended. No focal tenderness noted. No rebound or guarding  Musculoskeletal: She exhibits no tenderness.  Neurological: She is alert and  oriented to person, place, and time.  Cranial Nerves II-XII grossly intact. Motor and sensation appear to be baseline for patient. There is a resting head, neck and hand tremor the patient states is present after chemotherapy.  Skin: Skin is warm and dry. No rash noted. She is not diaphoretic.  Small petechial rash noted to bilateral lower extremities and trunk.  Psychiatric: She has a normal mood and affect.  Nursing note and vitals reviewed.   ED Course  Procedures (including critical care time) Labs Review Labs Reviewed  BASIC METABOLIC PANEL - Abnormal; Notable for the following:    Sodium 132 (*)    Chloride 100 (*)    Glucose, Bld 128 (*)    All other components within normal limits  BRAIN NATRIURETIC PEPTIDE - Abnormal; Notable for the following:    B Natriuretic Peptide 271.1 (*)    All other components within normal  limits  URINALYSIS, ROUTINE W REFLEX MICROSCOPIC (NOT AT [facility]) - Abnormal; Notable for the following:    APPearance CLOUDY (*)    Hgb urine dipstick MODERATE (*)    Protein, ur 30 (*)    Leukocytes, UA SMALL (*)    All other components within normal limits  CBC WITH DIFFERENTIAL/PLATELET - Abnormal; Notable for the following:    WBC 11.1 (*)    RBC 3.38 (*)    Hemoglobin 10.4 (*)    HCT 31.8 (*)    Platelets 127 (*)    Neutro Abs 8.5 (*)    Lymphocytes Relative 2 (*)    Lymphs Abs 0.2 (*)    Eosinophils Relative 13 (*)    Eosinophils Absolute 1.5 (*)    All other components within normal limits  TROPONIN I - Abnormal; Notable for the following:    Troponin I 0.36 (*)    All other components within normal limits  I-STAT TROPOININ, ED - Abnormal; Notable for the following:    Troponin i, poc 0.29 (*)    All other components within normal limits  I-STAT CG4 LACTIC ACID, ED - Abnormal; Notable for the following:    Lactic Acid, Venous 2.03 (*)    All other components within normal limits  URINE CULTURE  CULTURE, BLOOD (ROUTINE X 2)  CULTURE, BLOOD  (ROUTINE X 2)  CK  URINE MICROSCOPIC-ADD ON  TROPONIN I  TROPONIN I    Imaging Review Dg Chest 2 View  02/21/2015   CLINICAL DATA:  Fever since this morning. Undergoing chemotherapy for lymphoma.  EXAM: CHEST  2 VIEW  COMPARISON:  04/09/2014  FINDINGS: The cardiac silhouette, mediastinal and hilar contours are within normal limits and stable. Aortic calcifications are noted. The power port is in good position. No complicating features. The lungs are clear. No pleural effusion. The bony thorax is intact.  IMPRESSION: No acute cardiopulmonary findings.   Electronically Signed   By: Marijo Sanes M.D.   On: 02/21/2015 09:43     EKG Interpretation   Date/Time:  Friday February 21 2015 09:27:10 EDT Ventricular Rate:  97 PR Interval:  134 QRS Duration: 82 QT Interval:  329 QTC Calculation: 418 R Axis:   81 Text Interpretation:  Sinus rhythm Borderline right axis deviation  Probable anteroseptal infarct, old No significant change since last  tracing Confirmed by DOCHERTY  MD, MEGAN [phone]) on 02/21/2015 9:33:06 AM     Meds given in ED:  Medications  sodium chloride 0.9 % bolus 1,000 mL (1,000 mLs Intravenous New Bag/Given 02/21/15 0911)  acetaminophen (TYLENOL) tablet 650 mg (650 mg Oral Given 02/21/15 1033)    New Prescriptions   No medications on file   Filed Vitals:   02/21/15 1200 02/21/15 1230 02/21/15 1300 02/21/15 1330  BP: 123/50 141/66 115/54 118/56  Pulse: 95     Temp:    99.1 F (37.3 C)  TempSrc:      Resp: 15     SpO2: 97%   98%    MDM  Patient with a history of mantle cell lymphoma presents to the ED for fever and generally feeling unwell after chemotherapy yesterday. MAXIMUM TEMPERATURE in the ED 100.64F, given Tylenol in ED in temperature reduced to 99.70F. Vitals otherwise stable. Elevated troponin of 0.29 without EKG changes, normal chest x-ray and no chest pain. Consult cardiology for evaluation of potential cardiotoxicity secondary to chemotherapy  medications. Consult cardiology, will see in ED. Requests we order CK. Leukocytosis 11.1, up from 6/9  of 4.2. Hemoglobin 10.4 down from yesterday 12.6. CK normal No evidence of UTI. Chest x-ray shows no acute cardiopulmonary pathology No obvious source of infection, blood cultures drawn to evaluate for possible catheter/blood infection. Lactic acid 2.03  Discussed patient presentation and ED course with my attending, Dr. Tawnya Crook who also saw and evaluated the patient and agrees the plan for admission for general observation and further evaluation of patient's symptoms. Consult to internal medicine, Dr. Waldron Labs. Patient admitted. Final diagnoses:  Fever, unspecified fever cause  Elevated troponin       Comer Locket, PA-C 02/21/15 [place], MD 02/22/15 [phone]

## 2015-02-21 NOTE — ED Notes (Signed)
Pt reports to ed with c/o fever and shaking, sts had a chemo therapy yesterday, reports back pain.

## 2015-02-21 NOTE — H&P (Signed)
Patient Demographics  Shannon Parsons, is a 74 y.o. female  MRN: [id number]   DOB - 11-Mar-1941  Admit Date - 02/21/2015  Outpatient Primary MD for the patient is Alesia Richards, MD   With History of -  Past Medical History  Diagnosis Date  . OSA (obstructive sleep apnea)   . Hiatal hernia   . IBS (irritable bowel syndrome)   . Fibrocystic breast disease   . Asthma   . Diverticulosis   . Adenomatous colon polyp 1994  . Internal hemorrhoids   . Melanoma     Facial  . Hyperlipidemia   . Hypertension   . GERD (gastroesophageal reflux disease)   . Osteoporosis   . Osteoarthritis   . Vitamin D deficiency   . Prediabetes   . Unspecified hypothyroidism   . Family history of ischemic heart disease   . Mantle cell lymphoma 10/18/2014      Past Surgical History  Procedure Laterality Date  . Appendectomy    . Tonsillectomy    . Bunionectomy    . Knee arthroscopy    . Umbilical hernia repair      in for   Chief Complaint  Patient presents with  . Fever    cancer pt  . Shaking     HPI  Shannon Parsons  is a 74 y.o. female, past medical history of mantle cell lymphoma on chemotherapy, hypothyroidism, asthma, recent hospitalization in April for left eye cellulitis, presents with complaints of generalized weakness, fatigue, shaking and fever, NAD patient was a febrile, negative urinalysis, chest x-ray with no acute disease, blood work without significant abnormalities, patient had troponins sent which was found to be positive at 0.34, patient denies any chest pain, any shortness of breath, any diaphoresis, EKG nonacute, seen by cardiology service recommended to cycle cardiac enzymes, and follow on 2-D echo, blood cultures were sent in ED.    Review of Systems    In addition to the HPI above,  reports Fever-chills, No Headache, No changes with Vision or hearing, No problems swallowing food or Liquids, No Chest pain, Cough or Shortness of Breath, No Abdominal  pain, No Nausea or Vommitting, Bowel movements are regular, No Blood in stool or Urine, No dysuria, No new skin rashes or bruises, No new joints pains-aches,  No new weakness, tingling, numbness in any extremity, therefore generalized weakness and body ache No recent weight gain or loss, No polyuria, polydypsia or polyphagia, No significant Mental Stressors.  A full 10 point Review of Systems was done, except as stated above, all other Review of Systems were negative.   Social History History  Substance Use Topics  . Smoking status: Never Smoker   . Smokeless tobacco: Never Used     Comment: Never Used Tobacco  . Alcohol Use: No     Family History Family History  Problem Relation Age of Onset  . Emphysema Father   . Heart disease Father   . Heart attack Father 57    Died  . Heart disease Brother   . Rheum arthritis Sister   . Hypertension Mother   . Thyroid disease Mother   . Colon cancer Neg Hx      Prior to Admission medications   Medication Sig Start Date End Date Taking? Authorizing Provider  acetaminophen (TYLENOL) 325 MG tablet Take 325 mg by mouth every 6 (six) hours as needed (For pain.).    Yes Historical Provider, MD  beclomethasone (QVAR) 80 MCG/ACT inhaler Inhale 1 puff  into the lungs 2 (two) times daily.    Yes Historical Provider, MD  Cholecalciferol (VITAMIN D) 2000 UNITS CAPS Take 2,000-4,000 capsules by mouth every other day. Patient alternates 1 cap with 2 caps every other day.   Yes Historical Provider, MD  hyoscyamine (LEVSIN, ANASPAZ) 0.125 MG tablet Take 1 to 2 tablets every 4 hours as needed for nausea, cramping, bloating or diarrhea 08/19/14  Yes Unk Pinto, MD  levothyroxine (SYNTHROID, LEVOTHROID) 50 MCG tablet Take 50 mcg by mouth daily before breakfast.   Yes Historical Provider, MD  lidocaine-prilocaine (EMLA) cream Apply 1 application topically as needed. Apply to [facility] one hour before access. 10/17/14  Yes Volanda Napoleon, MD  LORazepam  (ATIVAN) 0.5 MG tablet Take 1 tablet (0.5 mg total) by mouth every 6 (six) hours as needed (Nausea or vomiting). 01/23/15  Yes Volanda Napoleon, MD  ondansetron (ZOFRAN) 8 MG tablet Take 1 tablet (8 mg total) by mouth 2 (two) times daily. Start the day after chemo for 2 days. Then take as needed for nausea or vomiting. 01/23/15  Yes Volanda Napoleon, MD  PRESCRIPTION MEDICATION Patient receives her treatments at the [facility] in [facility] with Dr Marin Olp. Her last dose of Bendeka 100mg  and Rituxan 600mg  was on 02/20/15.   Yes Historical Provider, MD  Probiotic Product (PROBIOTIC DAILY PO) Take 1 capsule by mouth daily.   Yes Historical Provider, MD  prochlorperazine (COMPAZINE) 10 MG tablet Take 1 tablet (10 mg total) by mouth every 6 (six) hours as needed (Nausea or vomiting). 01/23/15  Yes Volanda Napoleon, MD  ranitidine (ZANTAC) 300 MG tablet Take 300 mg by mouth daily as needed for heartburn.    Yes Unk Pinto, MD  rivaroxaban (XARELTO) 20 MG TABS tablet Take 1 tablet (20 mg total) by mouth daily with supper. 02/06/15  Yes Eliezer Bottom, NP    Allergies  Allergen Reactions  . Actonel [Risedronate Sodium] Other (See Comments)    "made me choke"   . Fosamax [Alendronate] Other (See Comments)    "made me choke"  . Sulfa Antibiotics Other (See Comments)    Doesn't remember   . Tetracyclines & Related Rash    Physical Exam  Vitals  Blood pressure 118/56, pulse 95, temperature 99.1 F (37.3 C), temperature source Oral, resp. rate 15, last menstrual period 09/14/2014, SpO2 98 %.   1. General ill-appearing female lying in bed in NAD,    2. Normal affect and insight, Not Suicidal or Homicidal, Awake Alert, Oriented X 3.  3. No F.N deficits, ALL C.Nerves Intact, Strength 5/5 all 4 extremities, Sensation intact all 4 extremities, Plantars down going.  4. Ears and Eyes appear Normal, Conjunctivae clear, PERRLA. Moist Oral Mucosa. With alopecia.  5. Supple Neck, No JVD,  No cervical lymphadenopathy appriciated, No Carotid Bruits.  6. Symmetrical Chest wall movement, Good air movement bilaterally, CTAB.  7. RRR, No Gallops, Rubs or Murmurs, No Parasternal Heave. Port-A-Cath accessed in the right upper chest  8. Positive Bowel Sounds, Abdomen Soft, No tenderness, No organomegaly appriciated,No rebound -guarding or rigidity.  9.  No Cyanosis, Normal Skin Turgor, No Skin Rash or Bruise.  10. Good muscle tone,  joints appear normal , no effusions, Normal ROM.    Data Review  CBC  Recent Labs Lab 02/20/15 0813 02/21/15 1100  WBC 4.2 11.1*  HGB 12.6 10.4*  HCT 37.6 31.8*  PLT 154 127*  MCV 95 94.1  MCH 31.9 30.8  MCHC  33.5 32.7  RDW 14.5 15.0  LYMPHSABS 0.9 0.2*  MONOABS  --  0.9  EOSABS 1.0* 1.5*  BASOSABS 0.1 0.0   ------------------------------------------------------------------------------------------------------------------  Chemistries   Recent Labs Lab 02/20/15 0813 02/21/15 0852  NA 140 132*  K 4.1 3.7  CL 101 100*  CO2 27 23  GLUCOSE 78 128*  BUN 11 15  CREATININE 0.8 0.87  CALCIUM 10.0 9.2  AST 32  --   ALT 24  --   ALKPHOS 47  --   BILITOT 0.70  --    ------------------------------------------------------------------------------------------------------------------ estimated creatinine clearance is 49.1 mL/min (by C-G formula based on Cr of 0.87). ------------------------------------------------------------------------------------------------------------------ No results for input(s): TSH, T4TOTAL, T3FREE, THYROIDAB in the last 72 hours.  Invalid input(s): FREET3   Coagulation profile No results for input(s): INR, PROTIME in the last 168 hours. ------------------------------------------------------------------------------------------------------------------- No results for input(s): DDIMER in the last 72  hours. -------------------------------------------------------------------------------------------------------------------  Cardiac Enzymes  Recent Labs Lab 02/21/15 1204  TROPONINI 0.36*   ------------------------------------------------------------------------------------------------------------------ Invalid input(s): POCBNP   ---------------------------------------------------------------------------------------------------------------  Urinalysis    Component Value Date/Time   COLORURINE YELLOW 02/21/2015 1056   APPEARANCEUR CLOUDY* 02/21/2015 1056   LABSPEC 1.019 02/21/2015 1056   LABSPEC 1.010 02/06/2015 0953   PHURINE 6.0 02/21/2015 1056   GLUCOSEU NEGATIVE 02/21/2015 1056   HGBUR MODERATE* 02/21/2015 1056   BILIRUBINUR NEGATIVE 02/21/2015 1056   KETONESUR NEGATIVE 02/21/2015 1056   PROTEINUR 30* 02/21/2015 1056   UROBILINOGEN 0.2 02/21/2015 1056   UROBILINOGEN 0.2 02/06/2015 0953   NITRITE NEGATIVE 02/21/2015 1056   LEUKOCYTESUR SMALL* 02/21/2015 1056    ----------------------------------------------------------------------------------------------------------------  Imaging results:   Dg Chest 2 View  02/21/2015   CLINICAL DATA:  Fever since this morning. Undergoing chemotherapy for lymphoma.  EXAM: CHEST  2 VIEW  COMPARISON:  04/09/2014  FINDINGS: The cardiac silhouette, mediastinal and hilar contours are within normal limits and stable. Aortic calcifications are noted. The power port is in good position. No complicating features. The lungs are clear. No pleural effusion. The bony thorax is intact.  IMPRESSION: No acute cardiopulmonary findings.   Electronically Signed   By: Marijo Sanes M.D.   On: 02/21/2015 09:43    My personal review of EKG: Rhythm NSR, Rate  97 /min, QTc 418 , no Acute ST changes    Assessment & Plan  Principal Problem:   Elevated troponin Active Problems:   Chronic obstructive airway disease with asthma   Hypertension   Mantle  cell lymphoma   Leg DVT (deep venous thromboembolism), acute  Elevated troponin - Patient denies any chest pain or shortness of breath, EKG nonacute, cardiology consult appreciated, patient will be admitted for further workup including telemetry monitoring, cycle cardiac enzymes, and follow on 2-D echo.  Generalized weakness, fever at home - Patient is a febrile here, has no neutropenia, so far negative urinalysis and chest x-ray, will follow blood cultures, discussed with Dr. Marin Olp, will hold on starting antibiotics. Continue with gentle hydration  COPD - No active wheezing, continue with Qvar and when necessary nebs.  Mantle cell lymphoma - Following with oncology as an outpatient, discussed with Dr Marin Olp.  Leg DVT - Continue with Xarelto    DVT Prophylaxis Xarelto  AM Labs Ordered, also please review Full Orders  Family Communication: Admission, patients condition and plan of care including tests being ordered have been discussed with the patient and husband who indicate understanding and agree with the plan and Code Status.  Code Status full  Likely DC to  home  when stable  Condition GUARDED    Time spent in minutes : 50 minutes    Glory Graefe M.D on 02/21/2015 at 2:48 PM  Between 7am to 7pm - Pager - [phone]  After 7pm go to [email or web address] - password [email or web address]  And look for the night coverage person covering me after hours  [facility] Office  [phone]

## 2015-02-21 NOTE — Progress Notes (Signed)
Patient was scheduled to come in for second dose of chemotherapy today. This morning she went to the ED with c/o fever. Dr Marin Olp has reviewed the ED notes, and has cancelled her second dose for today. We will follow up with the patient once home.

## 2015-02-21 NOTE — Consult Note (Signed)
Patient ID: Shannon Parsons MRN: [id number], DOB/AGE: 1940/10/28   Admit date: 02/21/2015   Primary Physician: Alesia Richards, MD Primary Cardiologist: Seen By Dr. Domenic Polite in the past.   Pt. Profile:  74 y/o female undergoing chemotherapy for mantle cell lymphoma, being evaluated for malaise with subjective fever and rigors, found to have an elevated troponin.   Problem List  Past Medical History  Diagnosis Date  . OSA (obstructive sleep apnea)   . Hiatal hernia   . IBS (irritable bowel syndrome)   . Fibrocystic breast disease   . Asthma   . Diverticulosis   . Adenomatous colon polyp 1994  . Internal hemorrhoids   . Melanoma     Facial  . Hyperlipidemia   . Hypertension   . GERD (gastroesophageal reflux disease)   . Osteoporosis   . Osteoarthritis   . Vitamin D deficiency   . Prediabetes   . Unspecified hypothyroidism   . Family history of ischemic heart disease   . Mantle cell lymphoma 10/18/2014    Past Surgical History  Procedure Laterality Date  . Appendectomy    . Tonsillectomy    . Bunionectomy    . Knee arthroscopy    . Umbilical hernia repair       Allergies  Allergies  Allergen Reactions  . Actonel [Risedronate Sodium] Other (See Comments)    "made me choke"   . Fosamax [Alendronate] Other (See Comments)    "made me choke"  . Sulfa Antibiotics Other (See Comments)    Doesn't remember   . Tetracyclines & Related Rash    HPI  74 y/o female with mantle cell lymphoma, currently undergoing chemotherapy. She presented to the [facility] ER today with complaints of malaise with subjective fever and rigors that started after her chemo treatment yesterday. Afebrile in ER with normal WBC count. Cardiology consulted for elevated troponin of 0.29. EKG shows NSR. No ischemic abnormalities.   Additional PMH is significant for HTN and HLD. She also notes family h/o of CAD. Her father had an MI in his 46s and her brother died from an MI in his 20s. She denies  h/o tobacco use. It appears that she has seen Dr. Domenic Polite in the past, however records not accessible electronically. She reports undergoing stress testing many years ago but she reports the result was normal. She had a 2D echo 10/10/14 performed as a part of chemotherapy monitoring to screen for medication induced cardiomyopathy. This demonstrated normal LV systolic function with EF of 60-65%. Normal strain parameters. Lateral S prime: 12 cm/sec. Global longitudinal strain: -21.4%. No valvular abnormalities.   In the ED, she admits to recent h/o substernal chest pressure that feels like "indigestion". Has occurred occasionally over the last several months since starting chemo. 3/10 in maximum intensity. Last ~5 minutes at a time and resolves spontaneously. Radiates across her chest. Can occur at rest and sometimes exacerbated by exertion, but no always. No associated dyspnea. Despite recent h/o of chest pain, she denies any chest pain symptoms yesterday or today. No n/v.     Home Medications  Prior to Admission medications   Medication Sig Start Date End Date Taking? Authorizing Provider  acetaminophen (TYLENOL) 325 MG tablet Take 325 mg by mouth every 6 (six) hours as needed (For pain.).    Yes Historical Provider, MD  beclomethasone (QVAR) 80 MCG/ACT inhaler Inhale 1 puff into the lungs 2 (two) times daily.    Yes Historical Provider, MD  Cholecalciferol (VITAMIN D) 2000 UNITS  CAPS Take 2,000-4,000 capsules by mouth every other day. Patient alternates 1 cap with 2 caps every other day.   Yes Historical Provider, MD  hyoscyamine (LEVSIN, ANASPAZ) 0.125 MG tablet Take 1 to 2 tablets every 4 hours as needed for nausea, cramping, bloating or diarrhea 08/19/14  Yes Unk Pinto, MD  levothyroxine (SYNTHROID, LEVOTHROID) 50 MCG tablet Take 50 mcg by mouth daily before breakfast.   Yes Historical Provider, MD  lidocaine-prilocaine (EMLA) cream Apply 1 application topically as needed. Apply to [facility] one  hour before access. 10/17/14  Yes Volanda Napoleon, MD  LORazepam (ATIVAN) 0.5 MG tablet Take 1 tablet (0.5 mg total) by mouth every 6 (six) hours as needed (Nausea or vomiting). 01/23/15  Yes Volanda Napoleon, MD  ondansetron (ZOFRAN) 8 MG tablet Take 1 tablet (8 mg total) by mouth 2 (two) times daily. Start the day after chemo for 2 days. Then take as needed for nausea or vomiting. 01/23/15  Yes Volanda Napoleon, MD  PRESCRIPTION MEDICATION Patient receives her treatments at the [facility] in [facility] with Dr Marin Olp. Her last dose of Bendeka 100mg  and Rituxan 600mg  was on 02/20/15.   Yes Historical Provider, MD  Probiotic Product (PROBIOTIC DAILY PO) Take 1 capsule by mouth daily.   Yes Historical Provider, MD  prochlorperazine (COMPAZINE) 10 MG tablet Take 1 tablet (10 mg total) by mouth every 6 (six) hours as needed (Nausea or vomiting). 01/23/15  Yes Volanda Napoleon, MD  ranitidine (ZANTAC) 300 MG tablet Take 300 mg by mouth daily as needed for heartburn.    Yes Unk Pinto, MD  rivaroxaban (XARELTO) 20 MG TABS tablet Take 1 tablet (20 mg total) by mouth daily with supper. 02/06/15  Yes Eliezer Bottom, NP    Family History  Family History  Problem Relation Age of Onset  . Emphysema Father   . Heart disease Father   . Heart attack Father 33    Died  . Heart disease Brother   . Rheum arthritis Sister   . Hypertension Mother   . Thyroid disease Mother   . Colon cancer Neg Hx     Social History  History   Social History  . Marital Status: Married    Spouse Name: N/A  . Number of Children: N/A  . Years of Education: N/A   Occupational History  . retired from office work    Social History Main Topics  . Smoking status: Never Smoker   . Smokeless tobacco: Never Used     Comment: Never Used Tobacco  . Alcohol Use: No  . Drug Use: No  . Sexual Activity: Not on file   Other Topics Concern  . Not on file   Social History Narrative     Review of  Systems General:  + chills, + fever, no night sweats or weight changes.  Cardiovascular:  + chest pain, no dyspnea on exertion, edema, orthopnea, palpitations, paroxysmal nocturnal dyspnea. Dermatological: No rash, lesions/masses Respiratory: No cough, dyspnea Urologic: No hematuria, dysuria Abdominal:   No nausea, vomiting, diarrhea, bright red blood per rectum, melena, or hematemesis Neurologic:  No visual changes, wkns, changes in mental status. All other systems reviewed and are otherwise negative except as noted above.  Physical Exam  Blood pressure 121/54, pulse 98, temperature 100.6 F (38.1 C), temperature source Oral, resp. rate 20, last menstrual period 09/14/2014, SpO2 97 %.  General: Pleasant, NAD, frail appearing Psych: Normal affect. Neuro: Alert and oriented X 3.  Moves all extremities spontaneously. HEENT: Normal  Neck: Supple without bruits or JVD. Lungs:  Resp regular and unlabored, CTA. Heart: RRR no s3, s4, or murmurs. Abdomen: Soft, non-tender, non-distended, BS + x 4.  Extremities: No clubbing, cyanosis or edema. DP/PT/Radials 2+ and equal bilaterally.  Labs  Troponin [facility] of Care Test)  Recent Labs  02/21/15 0901  TROPIPOC 0.29*   No results for input(s): CKTOTAL, CKMB, TROPONINI in the last 72 hours. Lab Results  Component Value Date   WBC 4.2 02/20/2015   HGB 12.6 02/20/2015   HCT 37.6 02/20/2015   MCV 95 02/20/2015   PLT 154 02/20/2015    Recent Labs Lab 02/20/15 0813 02/21/15 0852  NA 140 132*  K 4.1 3.7  CL 101 100*  CO2 27 23  BUN 11 15  CREATININE 0.8 0.87  CALCIUM 10.0 9.2  PROT 6.6  --   BILITOT 0.70  --   ALKPHOS 47  --   ALT 24  --   AST 32  --   GLUCOSE 78 128*   Lab Results  Component Value Date   CHOL 189 08/21/2014   HDL 34* 08/21/2014   LDLCALC 133* 08/21/2014   TRIG 111 08/21/2014   No results found for: DDIMER   Radiology/Studies  Dg Chest 2 View  02/21/2015   CLINICAL DATA:  Fever since this morning.  Undergoing chemotherapy for lymphoma.  EXAM: CHEST  2 VIEW  COMPARISON:  04/09/2014  FINDINGS: The cardiac silhouette, mediastinal and hilar contours are within normal limits and stable. Aortic calcifications are noted. The power port is in good position. No complicating features. The lungs are clear. No pleural effusion. The bony thorax is intact.  IMPRESSION: No acute cardiopulmonary findings.   Electronically Signed   By: Marijo Sanes M.D.   On: 02/21/2015 09:43    ECG  NSR. Nonacute  ASSESSMENT AND PLAN  1. Elevated Troponin: initial POC troponin elevated at 0.29. Despite the fact that she has not had pain today or yesterday, she has had intermittent chest pressure/"indigestion" over the last few months since starting chemo. Also with a h/o HTN, HLD and family h/o CAD. Her EKG today shows no ischemia. Recommend cycling cardiac enzymes x 3 to assess trend. Also re-evaluate systolic functio/ wall motion by 2D echo as part of chemotherapy screening. Order with strain.   Signed, Lyda Jester, PA-C 02/21/2015, 11:14 AM  I have examined the patient and reviewed assessment and plan and discussed with patient.  Agree with above as stated.  Abnormal troponin.  Somewhat atypical sx.  Normal echo.  COuld consider pharmacologic stress test once fevers have resolved.  Troponin could be from the stress of the febrile illness.    Shannon Steinmetz S.

## 2015-02-21 NOTE — ED Notes (Signed)
Per [id number] tube needs to be recollected for CBC, pt refused stick and now sts wants her port accessed, however she requests ice pack to numb the area prior to accessing it. Ice pack given, will check on pt in 10 min.

## 2015-02-21 NOTE — ED Notes (Signed)
Per nurse pt refuse anymore blood draw

## 2015-02-22 DIAGNOSIS — C8310 Mantle cell lymphoma, unspecified site: Secondary | ICD-10-CM

## 2015-02-22 DIAGNOSIS — R7989 Other specified abnormal findings of blood chemistry: Secondary | ICD-10-CM | POA: Diagnosis not present

## 2015-02-22 DIAGNOSIS — I82401 Acute embolism and thrombosis of unspecified deep veins of right lower extremity: Secondary | ICD-10-CM

## 2015-02-22 DIAGNOSIS — R509 Fever, unspecified: Secondary | ICD-10-CM | POA: Diagnosis not present

## 2015-02-22 DIAGNOSIS — R748 Abnormal levels of other serum enzymes: Secondary | ICD-10-CM | POA: Diagnosis not present

## 2015-02-22 DIAGNOSIS — J449 Chronic obstructive pulmonary disease, unspecified: Secondary | ICD-10-CM | POA: Diagnosis not present

## 2015-02-22 DIAGNOSIS — J45909 Unspecified asthma, uncomplicated: Secondary | ICD-10-CM | POA: Diagnosis not present

## 2015-02-22 LAB — CBC
HEMATOCRIT: 32.7 % — AB (ref 36.0–46.0)
HEMOGLOBIN: 10.5 g/dL — AB (ref 12.0–15.0)
MCH: 30.7 pg (ref 26.0–34.0)
MCHC: 32.1 g/dL (ref 30.0–36.0)
MCV: 95.6 fL (ref 78.0–100.0)
Platelets: 120 10*3/uL — ABNORMAL LOW (ref 150–400)
RBC: 3.42 MIL/uL — ABNORMAL LOW (ref 3.87–5.11)
RDW: 15.2 % (ref 11.5–15.5)
WBC: 6.5 10*3/uL (ref 4.0–10.5)

## 2015-02-22 LAB — BASIC METABOLIC PANEL
Anion gap: 5 (ref 5–15)
BUN: 13 mg/dL (ref 6–20)
CO2: 23 mmol/L (ref 22–32)
Calcium: 8.6 mg/dL — ABNORMAL LOW (ref 8.9–10.3)
Chloride: 108 mmol/L (ref 101–111)
Creatinine, Ser: 0.64 mg/dL (ref 0.44–1.00)
GFR calc non Af Amer: >60 mL/min (ref >60)
Glucose, Bld: 99 mg/dL (ref 65–99)
Potassium: 3.8 mmol/L (ref 3.5–5.1)
SODIUM: 136 mmol/L (ref 135–145)

## 2015-02-22 LAB — TROPONIN I: Troponin I: 0.28 ng/mL — ABNORMAL HIGH (ref <0.031)

## 2015-02-22 NOTE — Discharge Summary (Signed)
Shannon Parsons, is a 74 y.o. female  DOB June 03, 1941  MRN [id number].  Admission date:  02/21/2015  Admitting Physician  Albertine Patricia, MD  Discharge Date:  02/22/2015   Primary MD  Alesia Richards, MD  Recommendations for primary care physician for things to follow:  - Basic labs including CBC, BMP during next visit, please follow on the final results of blood cultures done during hospital stay on 02/21/15.   Admission Diagnosis  Elevated troponin [R79.89] Fever, unspecified fever cause [R50.9]   Discharge Diagnosis  Elevated troponin [R79.89] Fever, unspecified fever cause [R50.9]    Principal Problem:   Elevated troponin Active Problems:   Chronic obstructive airway disease with asthma   Hypertension   Mantle cell lymphoma   Leg DVT (deep venous thromboembolism), acute   Pyrexia      Past Medical History  Diagnosis Date  . OSA (obstructive sleep apnea)   . Hiatal hernia   . IBS (irritable bowel syndrome)   . Fibrocystic breast disease   . Asthma   . Diverticulosis   . Adenomatous colon polyp 1994  . Internal hemorrhoids   . Melanoma     Facial  . Hyperlipidemia   . Hypertension   . GERD (gastroesophageal reflux disease)   . Osteoporosis   . Osteoarthritis   . Vitamin D deficiency   . Prediabetes   . Unspecified hypothyroidism   . Family history of ischemic heart disease   . Mantle cell lymphoma 10/18/2014    Past Surgical History  Procedure Laterality Date  . Appendectomy    . Tonsillectomy    . Bunionectomy    . Knee arthroscopy    . Umbilical hernia repair         History of present illness and  Hospital Course:     Kindly see H&P for history of present illness and admission details, please review complete Labs, Consult reports and Test reports for all details in brief  HPI  from the history and physical done on the day of admission  Shannon Parsons  is a 74 y.o. female, past medical history of mantle cell lymphoma on chemotherapy, hypothyroidism, asthma, recent hospitalization in April for left eye cellulitis, presents with complaints of generalized weakness, fatigue, shaking and fever, NAD patient was a febrile, negative urinalysis, chest x-ray with no acute disease, blood work without significant abnormalities, patient had troponins sent which was found to be positive at 0.34, patient denies any chest pain, any shortness of breath, any diaphoresis, EKG nonacute, seen by cardiology service recommended to cycle cardiac enzymes, and follow on 2-D echo, blood cultures were sent in ED.  Hospital Course   Elevated troponin - Patient denies any chest pain or shortness of breath, EKG nonacute, cardiology consult appreciated,  - 2-D echo showing normal EF without focal wall motion abnormality, abnormal Myoview in 02/2014. - Denies any chest pain or shortness of breath, cardiac enzymes trending down no further workup as inpatient recommended by cardiology. - Can follow with cardiology as an outpatient  for possible need of stress test  Generalized weakness, fever at home - has no neutropenia, so far negative urinalysis and chest x-ray, blood cultures no growth to date , fever most likely related to mantle cell lymphoma versus viral illness, no indication for antibiotics as discussed with oncology Dr. Burr Medico.  COPD - No active wheezing,   Mantle cell lymphoma - Following with oncology as an outpatient,   Leg DVT - Continue with Xarelto      Discharge Condition:   stable Follow UP  Follow-up Information    Follow up with MCKEOWN,WILLIAM DAVID, MD. Schedule an appointment as soon as possible for a visit in 1 week.   Specialty:  Internal Medicine   Why:  Posthospitalization follow-up   Contact information:   [address] [place] [address] [phone]       Follow up with Volanda Napoleon, MD. Call in 1 week.    Specialty:  Oncology   Contact information:   [address], [address] [place] [address] [phone]       Schedule an appointment as soon as possible for a visit with [place].   Why:  Posthospitalization follow-up   Contact information:   [place] [address] [phone]        Discharge Instructions  and  Discharge Medications         Discharge Instructions    Discharge instructions    Complete by:  As directed   Follow with Primary MD MCKEOWN,WILLIAM DAVID, MD in 7 days   Get CBC, CMP, 2 view Chest X ray checked  by Primary MD next visit.    Activity: As tolerated with Full fall precautions use walker/cane & assistance as needed   Disposition Home **   Diet: Heart Healthy ** , with feeding assistance and aspiration precautions.  For Heart failure patients - Check your Weight same time everyday, if you gain over 2 pounds, or you develop in leg swelling, experience more shortness of breath or chest pain, call your Primary MD immediately. Follow Cardiac Low Salt Diet and 1.5 lit/day fluid restriction.   On your next visit with your primary care physician please Get Medicines reviewed and adjusted.   Please request your Prim.MD to go over all Hospital Tests and Procedure/Radiological results at the follow up, please get all Hospital records sent to your Prim MD by signing hospital release before you go home.   If you experience worsening of your admission symptoms, develop shortness of breath, life threatening emergency, suicidal or homicidal thoughts you must seek medical attention immediately by calling 911 or calling your MD immediately  if symptoms less severe.  You Must read complete instructions/literature along with all the possible adverse reactions/side effects for all the Medicines you take and that have been prescribed to you. Take any new Medicines after  you have completely understood and accpet all the possible adverse reactions/side effects.   Do not drive, operating heavy machinery, perform activities at heights, swimming or participation in water activities or provide baby sitting services if your were admitted for syncope or siezures until you have seen by Primary MD or a Neurologist and advised to do so again.  Do not drive when taking Pain medications.    Do not take more than prescribed Pain, Sleep and Anxiety Medications  Special Instructions: If you have smoked or chewed Tobacco  in the last 2 yrs please stop smoking, stop any  regular Alcohol  and or any Recreational drug use.  Wear Seat belts while driving.   Please note  You were cared for by a hospitalist during your hospital stay. If you have any questions about your discharge medications or the care you received while you were in the hospital after you are discharged, you can call the unit and asked to speak with the hospitalist on call if the hospitalist that took care of you is not available. Once you are discharged, your primary care physician will handle any further medical issues. Please note that NO REFILLS for any discharge medications will be authorized once you are discharged, as it is imperative that you return to your primary care physician (or establish a relationship with a primary care physician if you do not have one) for your aftercare needs so that they can reassess your need for medications and monitor your lab values.     Increase activity slowly    Complete by:  As directed             Medication List    TAKE these medications        acetaminophen 325 MG tablet  Commonly known as:  TYLENOL  Take 325 mg by mouth every 6 (six) hours as needed (For pain.).     beclomethasone 80 MCG/ACT inhaler  Commonly known as:  QVAR  Inhale 1 puff into the lungs 2 (two) times daily.     hyoscyamine 0.125 MG tablet  Commonly known as:  LEVSIN, ANASPAZ  Take 1 to  2 tablets every 4 hours as needed for nausea, cramping, bloating or diarrhea     levothyroxine 50 MCG tablet  Commonly known as:  SYNTHROID, LEVOTHROID  Take 50 mcg by mouth daily before breakfast.     lidocaine-prilocaine cream  Commonly known as:  EMLA  Apply 1 application topically as needed. Apply to [facility] one hour before access.     LORazepam 0.5 MG tablet  Commonly known as:  ATIVAN  Take 1 tablet (0.5 mg total) by mouth every 6 (six) hours as needed (Nausea or vomiting).     ondansetron 8 MG tablet  Commonly known as:  ZOFRAN  Take 1 tablet (8 mg total) by mouth 2 (two) times daily. Start the day after chemo for 2 days. Then take as needed for nausea or vomiting.     PRESCRIPTION MEDICATION  Patient receives her treatments at the [facility] with Dr Marin Olp. Her last dose of Bendeka 100mg  and Rituxan 600mg  was on 02/20/15.     PROBIOTIC DAILY PO  Take 1 capsule by mouth daily.     prochlorperazine 10 MG tablet  Commonly known as:  COMPAZINE  Take 1 tablet (10 mg total) by mouth every 6 (six) hours as needed (Nausea or vomiting).     ranitidine 300 MG tablet  Commonly known as:  ZANTAC  Take 300 mg by mouth daily as needed for heartburn.     rivaroxaban 20 MG Tabs tablet  Commonly known as:  XARELTO  Take 1 tablet (20 mg total) by mouth daily with supper.     Vitamin D 2000 UNITS Caps  Take 2,000-4,000 capsules by mouth every other day. Patient alternates 1 cap with 2 caps every other day.          Diet and Activity recommendation: See Discharge Instructions above   Consults obtained -  caridology oncology   Major procedures and Radiology Reports - PLEASE review  detailed and final reports for all details, in brief -      Dg Chest 2 View  02/21/2015   CLINICAL DATA:  Fever since this morning. Undergoing chemotherapy for lymphoma.  EXAM: CHEST  2 VIEW  COMPARISON:  04/09/2014  FINDINGS: The cardiac silhouette, mediastinal and hilar  contours are within normal limits and stable. Aortic calcifications are noted. The power port is in good position. No complicating features. The lungs are clear. No pleural effusion. The bony thorax is intact.  IMPRESSION: No acute cardiopulmonary findings.   Electronically Signed   By: Marijo Sanes M.D.   On: 02/21/2015 09:43    Micro Results   Results for orders placed or performed during the hospital encounter of 02/21/15  Blood culture (routine x 2)     Status: None (Preliminary result)   Collection Time: 02/21/15  9:55 AM  Result Value Ref Range Status   Specimen Description BLOOD BLOOD RIGHT FOREARM  Final   Special Requests BOTTLES DRAWN AEROBIC AND ANAEROBIC 5 CC EA  Final   Culture   Final           BLOOD CULTURE RECEIVED NO GROWTH TO DATE CULTURE WILL BE HELD FOR 5 DAYS BEFORE ISSUING A FINAL NEGATIVE REPORT Performed at [facility]    Report Status PENDING  Incomplete  Blood culture (routine x 2)     Status: None (Preliminary result)   Collection Time: 02/21/15 10:07 AM  Result Value Ref Range Status   Specimen Description BLOOD BLOOD RIGHT FOREARM  Final   Special Requests BOTTLES DRAWN AEROBIC AND ANAEROBIC 3 CC EA  Final   Culture   Final           BLOOD CULTURE RECEIVED NO GROWTH TO DATE CULTURE WILL BE HELD FOR 5 DAYS BEFORE ISSUING A FINAL NEGATIVE REPORT Performed at [facility]    Report Status PENDING  Incomplete     Recent Results (from the past 240 hour(s))  Blood culture (routine x 2)     Status: None (Preliminary result)   Collection Time: 02/21/15  9:55 AM  Result Value Ref Range Status   Specimen Description BLOOD BLOOD RIGHT FOREARM  Final   Special Requests BOTTLES DRAWN AEROBIC AND ANAEROBIC 5 CC EA  Final   Culture   Final           BLOOD CULTURE RECEIVED NO GROWTH TO DATE CULTURE WILL BE HELD FOR 5 DAYS BEFORE ISSUING A FINAL NEGATIVE REPORT Performed at [facility]    Report Status PENDING  Incomplete  Blood culture  (routine x 2)     Status: None (Preliminary result)   Collection Time: 02/21/15 10:07 AM  Result Value Ref Range Status   Specimen Description BLOOD BLOOD RIGHT FOREARM  Final   Special Requests BOTTLES DRAWN AEROBIC AND ANAEROBIC 3 CC EA  Final   Culture   Final           BLOOD CULTURE RECEIVED NO GROWTH TO DATE CULTURE WILL BE HELD FOR 5 DAYS BEFORE ISSUING A FINAL NEGATIVE REPORT Performed at [facility]    Report Status PENDING  Incomplete       Today   Subjective:   Shannon Parsons today has no headache,no chest abdominal pain,no new weakness tingling or numbness, feels much better  today.   Objective:   Blood pressure 107/56, pulse 85, temperature 99.2 F (37.3 C), temperature source Oral, resp. rate 18, height 5\' 4"  (1.626 m), weight 57.607  kg (127 lb), last menstrual period 09/14/2014, SpO2 97 %.   Intake/Output Summary (Last 24 hours) at 02/22/15 1203 Last data filed at 02/22/15 0655  Gross per 24 hour  Intake  797.5 ml  Output      0 ml  Net  797.5 ml    Exam Awake Alert, Oriented x 3, No new F.N deficits, Normal affect [place].AT,PERRAL Supple Neck,No JVD, No cervical lymphadenopathy appriciated.  Symmetrical Chest wall movement, Good air movement bilaterally, CTAB RRR,No Gallops,Rubs or new Murmurs, No Parasternal Heave +ve B.Sounds, Abd Soft, Non tender, No organomegaly appriciated, No rebound -guarding or rigidity. No Cyanosis, Clubbing or edema, has mild lower extremity maculopapular rash.  Data Review   CBC w Diff:  Lab Results  Component Value Date   WBC 6.5 02/22/2015   WBC 4.2 02/20/2015   HGB 10.5* 02/22/2015   HGB 12.6 02/20/2015   HCT 32.7* 02/22/2015   HCT 37.6 02/20/2015   PLT 120* 02/22/2015   PLT 154 02/20/2015   LYMPHOPCT 2* 02/21/2015   LYMPHOPCT 21.0 02/20/2015   MONOPCT 8 02/21/2015   MONOPCT 16.3* 02/20/2015   EOSPCT 13* 02/21/2015   EOSPCT 23.4* 02/20/2015   BASOPCT 0 02/21/2015   BASOPCT 1.2 02/20/2015    CMP:    Lab Results  Component Value Date   NA 136 02/22/2015   NA 140 02/20/2015   K 3.8 02/22/2015   K 4.1 02/20/2015   CL 108 02/22/2015   CL 101 02/20/2015   CO2 23 02/22/2015   CO2 27 02/20/2015   BUN 13 02/22/2015   BUN 11 02/20/2015   CREATININE 0.64 02/22/2015   CREATININE 0.8 02/20/2015   PROT 6.6 02/20/2015   PROT 5.9* 02/06/2015   ALBUMIN 3.8 02/06/2015   BILITOT 0.70 02/20/2015   BILITOT 0.4 02/06/2015   ALKPHOS 47 02/20/2015   ALKPHOS 48 02/06/2015   AST 32 02/20/2015   AST 26 02/06/2015   ALT 24 02/20/2015   ALT 16 02/06/2015  .   Total Time in preparing paper work, data evaluation and todays exam - 35 minutes  Tayjon Halladay M.D on 02/22/2015 at 12:03 PM  [facility]   Office  [phone]

## 2015-02-22 NOTE — Progress Notes (Signed)
Consulting cardiologist: Dr. Casandra Doffing  Seen for followup: Abnormal troponin I  Subjective:    Patient reports no chest pain or shortness of breath at rest, no palpitations.  Objective:   Temp:  [98.2 F (36.8 C)-100.6 F (38.1 C)] 99.2 F (37.3 C) (06/11 0555) Pulse Rate:  [82-122] 85 (06/11 0555) Resp:  [15-22] 18 (06/11 0555) BP: (96-141)/(50-66) 107/56 mmHg (06/11 0555) SpO2:  [95 %-100 %] 97 % (06/11 0555) Weight:  [127 lb (57.607 kg)] 127 lb (57.607 kg) (06/10 1530) Last BM Date: 02/21/15  Filed Weights   02/21/15 1530  Weight: 127 lb (57.607 kg)    Intake/Output Summary (Last 24 hours) at 02/22/15 0736 Last data filed at 02/22/15 0655  Gross per 24 hour  Intake  797.5 ml  Output      0 ml  Net  797.5 ml    Telemetry: Sinus rhythm.  Exam:  General: No distress.  Lungs: Clear, nonlabored.  Cardiac: RRR without gallop or rub.  Extremities: No pitting edema.  Lab Results:  Basic Metabolic Panel:  Recent Labs Lab 02/20/15 0813 02/21/15 0852 02/22/15 0405  NA 140 132* 136  K 4.1 3.7 3.8  CL 101 100* 108  CO2 27 23 23   GLUCOSE 78 128* 99  BUN 11 15 13   CREATININE 0.8 0.87 0.64  CALCIUM 10.0 9.2 8.6*    CBC:  Recent Labs Lab 02/20/15 0813 02/21/15 1100 02/22/15 0405  WBC 4.2 11.1* 6.5  HGB 12.6 10.4* 10.5*  HCT 37.6 31.8* 32.7*  MCV 95 94.1 95.6  PLT 154 127* 120*    Cardiac Enzymes:  Recent Labs Lab 02/21/15 0852 02/21/15 1204 02/21/15 1905 02/22/15 0105  CKTOTAL 43  --   --   --   TROPONINI  --  0.36* 0.30* 0.28*    Echocardiogram 02/21/2015: Study Conclusions  - Left ventricle: The cavity size was normal. Wall thickness was normal. The estimated ejection fraction was 60%. Wall motion was normal; there were no regional wall motion abnormalities. - Right ventricle: The cavity size was mildly dilated. Systolic function was normal.  Exercise Myoview 02/28/2014: FINDINGS: Walking on the treadmill, there  were no significant abnormalities. The raw nuclear data reveals no evidence of excess motion. Tomographic images at rest reveal normal uptake in all segments. Tomographic images with stress reveal normal uptake in all segments. The ejection fraction is normal. It is noted that the ventricle is very small. The computer estimates the ejection fraction at 87%. This is falsely high because the ventricle is small. The ejection fraction is in the normal range.  IMPRESSION: This is a normal stress nuclear scan. The interpretation is made more difficult because to the left ventricle is very small. However there are no definite abnormalities.   Medications:   Scheduled Medications: . budesonide (PULMICORT) nebulizer solution  0.25 mg Nebulization BID  . levothyroxine  50 mcg Oral QAC breakfast  . ondansetron  8 mg Oral BID  . rivaroxaban  20 mg Oral Q supper  . sodium chloride  3 mL Intravenous Q12H     Infusions: . sodium chloride    . sodium chloride 75 mL/hr at 02/22/15 0329     PRN Medications:  acetaminophen, bisacodyl, HYDROcodone-acetaminophen, hyoscyamine, LORazepam, ondansetron **OR** ondansetron (ZOFRAN) IV, polyethylene glycol, prochlorperazine, sodium chloride   Assessment:   1. Abnormal troponin I, low-level and in a flat pattern. No acute ST segment changes by recent ECG, and echocardiogram shows normal LVEF without focal wall motion abnormalities. She  had a normal Myoview in June 2015. Some indigestion-like chest pain when she presented with fevers and rigors, but none at present.  2. Mantle cell lymphoma, undergoing chemotherapy treatments, followed by Dr. Marin Olp.  3. Lower extremity DVT, currently on Xarelto. Pulmonary embolus could also cause abnormal troponin I levels, however she is not tachycardic or hypoxic, and already on anticoagulation making this possibility much less likely.  Plan/Discussion:    At this point further inpatient cardiac testing is not  planned, as long as she continues to improve clinically. Check follow-up ECG. Abnormal troponin I levels are likely related to myocardial strain rather than true ACS. Last ischemic evaluation was one year ago and reassuring as outlined above. She can be given follow-up in our [facility] cardiology office after discharge for reassessment and consideration of a follow-up Myoview as an outpatient to reassess ischemic burden.   Satira Sark, M.D., F.A.C.C.

## 2015-02-22 NOTE — Progress Notes (Signed)
Pt discharged to home. DC instructions given with significant other at bedside.  No concerns voiced. Left unit in good condition steadily ambulating off unit accompanied by significant other. No concerns voiced. Vwilliams,rn.

## 2015-02-22 NOTE — Discharge Instructions (Signed)
Follow with Primary MD MCKEOWN,WILLIAM DAVID, MD in 7 days  ° °Get CBC, CMP, 2 view Chest X ray checked  by Primary MD next visit.  ° ° °Activity: As tolerated with Full fall precautions use walker/cane & assistance as needed ° ° °Disposition Home  ° ° °Diet: Heart Healthy , with feeding assistance and aspiration precautions. ° °For Heart failure patients - Check your Weight same time everyday, if you gain over 2 pounds, or you develop in leg swelling, experience more shortness of breath or chest pain, call your Primary MD immediately. Follow Cardiac Low Salt Diet and 1.5 lit/day fluid restriction. ° ° °On your next visit with your primary care physician please Get Medicines reviewed and adjusted. ° ° °Please request your Prim.MD to go over all Hospital Tests and Procedure/Radiological results at the follow up, please get all Hospital records sent to your Prim MD by signing hospital release before you go home. ° ° °If you experience worsening of your admission symptoms, develop shortness of breath, life threatening emergency, suicidal or homicidal thoughts you must seek medical attention immediately by calling 911 or calling your MD immediately  if symptoms less severe. ° °You Must read complete instructions/literature along with all the possible adverse reactions/side effects for all the Medicines you take and that have been prescribed to you. Take any new Medicines after you have completely understood and accpet all the possible adverse reactions/side effects.  ° °Do not drive, operating heavy machinery, perform activities at heights, swimming or participation in water activities or provide baby sitting services if your were admitted for syncope or siezures until you have seen by Primary MD or a Neurologist and advised to do so again. ° °Do not drive when taking Pain medications.  ° ° °Do not take more than prescribed Pain, Sleep and Anxiety Medications ° °Special Instructions: If you have smoked or chewed Tobacco   in the last 2 yrs please stop smoking, stop any regular Alcohol  and or any Recreational drug use. ° °Wear Seat belts while driving. ° ° °Please note ° °You were cared for by a hospitalist during your hospital stay. If you have any questions about your discharge medications or the care you received while you were in the hospital after you are discharged, you can call the unit and asked to speak with the hospitalist on call if the hospitalist that took care of you is not available. Once you are discharged, your primary care physician will handle any further medical issues. Please note that NO REFILLS for any discharge medications will be authorized once you are discharged, as it is imperative that you return to your primary care physician (or establish a relationship with a primary care physician if you do not have one) for your aftercare needs so that they can reassess your need for medications and monitor your lab values. ° °

## 2015-02-22 NOTE — Progress Notes (Signed)
PT Cancellation Note  Patient Details Name: MIRI JOSE MRN: [id number] DOB: 1940/10/31   Cancelled Treatment:    Reason Eval/Treat Not Completed: PT screened, no needs identified, will sign off. Nursing reports pt has been up in room independently.  Spoke to pt who politely declines PT stating she is doing fine.  Reviewed home safety and she verbalized understanding.  Pt plans on going home today and explained that if she ends up staying and feels she needs PT that she can request MD to re-order.  Will sign off.   Omar Gayden LUBECK 02/22/2015, 9:03 AM

## 2015-02-23 LAB — URINE CULTURE

## 2015-02-24 LAB — HEMOGLOBIN A1C
Hgb A1c MFr Bld: 5 % (ref 4.8–5.6)
MEAN PLASMA GLUCOSE: 97 mg/dL

## 2015-02-26 ENCOUNTER — Ambulatory Visit (HOSPITAL_BASED_OUTPATIENT_CLINIC_OR_DEPARTMENT_OTHER): Payer: Medicare Other

## 2015-02-26 ENCOUNTER — Other Ambulatory Visit: Payer: Self-pay | Admitting: Internal Medicine

## 2015-02-26 VITALS — BP 124/77 | HR 84 | Temp 98.0°F | Resp 20

## 2015-02-26 DIAGNOSIS — Z5111 Encounter for antineoplastic chemotherapy: Secondary | ICD-10-CM | POA: Diagnosis not present

## 2015-02-26 DIAGNOSIS — C8310 Mantle cell lymphoma, unspecified site: Secondary | ICD-10-CM

## 2015-02-26 MED ORDER — SODIUM CHLORIDE 0.9 % IJ SOLN
10.0000 mL | INTRAMUSCULAR | Status: DC | PRN
  Filled 2015-02-26: qty 10

## 2015-02-26 MED ORDER — SODIUM CHLORIDE 0.9 % IV SOLN
Freq: Once | INTRAVENOUS | Status: AC
  Administered 2015-02-26: 13:00:00 via INTRAVENOUS
  Filled 2015-02-26: qty 4

## 2015-02-26 MED ORDER — HEPARIN SOD (PORK) LOCK FLUSH 100 UNIT/ML IV SOLN
500.0000 [IU] | Freq: Once | INTRAVENOUS | Status: DC | PRN
  Filled 2015-02-26: qty 5

## 2015-02-26 MED ORDER — SODIUM CHLORIDE 0.9 % IV SOLN
100.0000 mg/m2 | Freq: Once | INTRAVENOUS | Status: AC
  Administered 2015-02-26: 150 mg via INTRAVENOUS
  Filled 2015-02-26: qty 6

## 2015-02-26 MED ORDER — SODIUM CHLORIDE 0.9 % IV SOLN
Freq: Once | INTRAVENOUS | Status: AC
  Administered 2015-02-26: 13:00:00 via INTRAVENOUS

## 2015-02-26 NOTE — Patient Instructions (Signed)
Bendamustine Injection What is this medicine? BENDAMUSTINE (BEN da MUS teen) is a chemotherapy drug. It is used to treat chronic lymphocytic leukemia and non-Hodgkin lymphoma. This medicine may be used for other purposes; ask your health care provider or pharmacist if you have questions. COMMON BRAND NAME(S): Treanda What should I tell my health care provider before I take this medicine? They need to know if you have any of these conditions: -kidney disease -liver disease -an unusual or allergic reaction to bendamustine, mannitol, other medicines, foods, dyes, or preservatives -pregnant or trying to get pregnant -breast-feeding How should I use this medicine? This medicine is for infusion into a vein. It is given by a health care professional in a hospital or clinic setting. Talk to your pediatrician regarding the use of this medicine in children. Special care may be needed. Overdosage: If you think you have taken too much of this medicine contact a poison control center or emergency room at once. NOTE: This medicine is only for you. Do not share this medicine with others. What if I miss a dose? It is important not to miss your dose. Call your doctor or health care professional if you are unable to keep an appointment. What may interact with this medicine? Do not take this medicine with any of the following medications: -clozapine This medicine may also interact with the following medications: -atazanavir -cimetidine -ciprofloxacin -enoxacin -fluvoxamine -medicines for seizures like carbamazepine and phenobarbital -mexiletine -rifampin -tacrine -thiabendazole -zileuton This list may not describe all possible interactions. Give your health care provider a list of all the medicines, herbs, non-prescription drugs, or dietary supplements you use. Also tell them if you smoke, drink alcohol, or use illegal drugs. Some items may interact with your medicine. What should I watch for while  using this medicine? Your condition will be monitored carefully while you are receiving this medicine. This drug may make you feel generally unwell. This is not uncommon, as chemotherapy can affect healthy cells as well as cancer cells. Report any side effects. Continue your course of treatment even though you feel ill unless your doctor tells you to stop. Call your doctor or health care professional for advice if you get a fever, chills or sore throat, or other symptoms of a cold or flu. Do not treat yourself. This drug decreases your body's ability to fight infections. Try to avoid being around people who are sick. This medicine may increase your risk to bruise or bleed. Call your doctor or health care professional if you notice any unusual bleeding. Be careful brushing and flossing your teeth or using a toothpick because you may get an infection or bleed more easily. If you have any dental work done, tell your dentist you are receiving this medicine. Avoid taking products that contain aspirin, acetaminophen, ibuprofen, naproxen, or ketoprofen unless instructed by your doctor. These medicines may hide a fever. Do not become pregnant while taking this medicine. Women should inform their doctor if they wish to become pregnant or think they might be pregnant. There is a potential for serious side effects to an unborn child. Men should inform their doctors if they wish to father a child. This medicine may lower sperm counts. Talk to your health care professional or pharmacist for more information. Do not breast-feed an infant while taking this medicine. What side effects may I notice from receiving this medicine? Side effects that you should report to your doctor or health care professional as soon as possible: -allergic reactions like skin rash,   itching or hives, swelling of the face, lips, or tongue -low blood counts - this medicine may decrease the number of white blood cells, red blood cells and  platelets. You may be at increased risk for infections and bleeding. -signs of infection - fever or chills, cough, sore throat, pain or difficulty passing urine -signs of decreased platelets or bleeding - bruising, pinpoint red spots on the skin, black, tarry stools, blood in the urine -signs of decreased red blood cells - unusually weak or tired, fainting spells, lightheadedness -trouble passing urine or change in the amount of urine Side effects that usually do not require medical attention (report to your doctor or health care professional if they continue or are bothersome): -diarrhea This list may not describe all possible side effects. Call your doctor for medical advice about side effects. You may report side effects to FDA at 1-800-FDA-1088. Where should I keep my medicine? This drug is given in a hospital or clinic and will not be stored at home. NOTE: This sheet is a summary. It may not cover all possible information. If you have questions about this medicine, talk to your doctor, pharmacist, or health care provider.  2015, Elsevier/Gold Standard. (2011-11-26 14:15:47)  

## 2015-02-27 LAB — CULTURE, BLOOD (ROUTINE X 2)
Culture: NO GROWTH
Culture: NO GROWTH

## 2015-03-05 ENCOUNTER — Encounter: Payer: Self-pay | Admitting: Physician Assistant

## 2015-03-05 ENCOUNTER — Ambulatory Visit (INDEPENDENT_AMBULATORY_CARE_PROVIDER_SITE_OTHER): Payer: Medicare Other | Admitting: Physician Assistant

## 2015-03-05 VITALS — BP 110/72 | HR 96 | Temp 97.9°F | Resp 16 | Ht 63.5 in | Wt 119.0 lb

## 2015-03-05 DIAGNOSIS — R5383 Other fatigue: Secondary | ICD-10-CM

## 2015-03-05 DIAGNOSIS — R0602 Shortness of breath: Secondary | ICD-10-CM

## 2015-03-05 DIAGNOSIS — R079 Chest pain, unspecified: Secondary | ICD-10-CM

## 2015-03-05 DIAGNOSIS — R350 Frequency of micturition: Secondary | ICD-10-CM

## 2015-03-05 LAB — CBC WITH DIFFERENTIAL/PLATELET
Basophils Absolute: 0 10*3/uL (ref 0.0–0.1)
Basophils Relative: 1 % (ref 0–1)
Eosinophils Absolute: 0.6 10*3/uL (ref 0.0–0.7)
Eosinophils Relative: 14 % — ABNORMAL HIGH (ref 0–5)
HCT: 39.2 % (ref 36.0–46.0)
HEMOGLOBIN: 12.9 g/dL (ref 12.0–15.0)
LYMPHS PCT: 7 % — AB (ref 12–46)
Lymphs Abs: 0.3 10*3/uL — ABNORMAL LOW (ref 0.7–4.0)
MCH: 30.1 pg (ref 26.0–34.0)
MCHC: 32.9 g/dL (ref 30.0–36.0)
MCV: 91.6 fL (ref 78.0–100.0)
MONO ABS: 0.4 10*3/uL (ref 0.1–1.0)
MPV: 9.3 fL (ref 8.6–12.4)
Monocytes Relative: 10 % (ref 3–12)
NEUTROS ABS: 2.9 10*3/uL (ref 1.7–7.7)
Neutrophils Relative %: 68 % (ref 43–77)
PLATELETS: 168 10*3/uL (ref 150–400)
RBC: 4.28 MIL/uL (ref 3.87–5.11)
RDW: 15.4 % (ref 11.5–15.5)
WBC: 4.2 10*3/uL (ref 4.0–10.5)

## 2015-03-05 NOTE — Patient Instructions (Addendum)
Chest Pain (Nonspecific) It is often hard to give a specific diagnosis for the cause of chest pain. There is always a chance that your pain could be related to something serious, such as a heart attack or a blood clot in the lungs. You need to follow up with your health care provider for further evaluation. CAUSES   Heartburn.  Pneumonia or bronchitis.  Anxiety or stress.  Inflammation around your heart (pericarditis) or lung (pleuritis or pleurisy).  A blood clot in the lung.  A collapsed lung (pneumothorax). It can develop suddenly on its own (spontaneous pneumothorax) or from trauma to the chest.  Shingles infection (herpes zoster virus). The chest wall is composed of bones, muscles, and cartilage. Any of these can be the source of the pain.  The bones can be bruised by injury.  The muscles or cartilage can be strained by coughing or overwork.  The cartilage can be affected by inflammation and become sore (costochondritis). DIAGNOSIS  Lab tests or other studies may be needed to find the cause of your pain. Your health care provider may have you take a test called an ambulatory electrocardiogram (ECG). An ECG records your heartbeat patterns over a 24-hour period. You may also have other tests, such as:  Transthoracic echocardiogram (TTE). During echocardiography, sound waves are used to evaluate how blood flows through your heart.  Transesophageal echocardiogram (TEE).  Cardiac monitoring. This allows your health care provider to monitor your heart rate and rhythm in real time.  Holter monitor. This is a portable device that records your heartbeat and can help diagnose heart arrhythmias. It allows your health care provider to track your heart activity for several days, if needed.  Stress tests by exercise or by giving medicine that makes the heart beat faster. TREATMENT   Treatment depends on what may be causing your chest pain. Treatment may include:  Acid blockers for  heartburn.  Anti-inflammatory medicine.  Pain medicine for inflammatory conditions.  Antibiotics if an infection is present.  You may be advised to change lifestyle habits. This includes stopping smoking and avoiding alcohol, caffeine, and chocolate.  You may be advised to keep your head raised (elevated) when sleeping. This reduces the chance of acid going backward from your stomach into your esophagus. Most of the time, nonspecific chest pain will improve within 2-3 days with rest and mild pain medicine.  HOME CARE INSTRUCTIONS   If antibiotics were prescribed, take them as directed. Finish them even if you start to feel better.  For the next few days, avoid physical activities that bring on chest pain. Continue physical activities as directed.  Do not use any tobacco products, including cigarettes, chewing tobacco, or electronic cigarettes.  Avoid drinking alcohol.  Only take medicine as directed by your health care provider.  Follow your health care provider's suggestions for further testing if your chest pain does not go away.  Keep any follow-up appointments you made. If you do not go to an appointment, you could develop lasting (chronic) problems with pain. If there is any problem keeping an appointment, call to reschedule. SEEK MEDICAL CARE IF:   Your chest pain does not go away, even after treatment.  You have a rash with blisters on your chest.  You have a fever. SEEK IMMEDIATE MEDICAL CARE IF:   You have increased chest pain or pain that spreads to your arm, neck, jaw, back, or abdomen.  You have shortness of breath.  You have an increasing cough, or you cough  up blood.  You have severe back or abdominal pain.  You feel nauseous or vomit.  You have severe weakness.  You faint.  You have chills. This is an emergency. Do not wait to see if the pain will go away. Get medical help at once. Call your local emergency services (911 in [place] Do not drive  yourself to the hospital. MAKE SURE YOU:   Understand these instructions.  Will watch your condition.  Will get help right away if you are not doing well or get worse. Document Released: 06/09/2005 Document Revised: 09/04/2013 Document Reviewed: 04/04/2008 [facility] Patient Information 2015 [place], [place]. This information is not intended to replace advice given to you by your health care provider. Make sure you discuss any questions you have with your health care provider.   Pulmonary Embolism A pulmonary (lung) embolism (PE) is a blood clot that has traveled to the lung and results in a blockage of blood flow in the affected lung. Most clots come from deep veins in the legs or pelvis. PE is a dangerous and potentially life-threatening condition that can be treated if identified. CAUSES Blood clots form in a vein for different reasons. Usually several things cause blood clots. They include:  The flow of blood slows down.  The inside of the vein is damaged in some way.  The person has a condition that makes the blood clot more easily. RISK FACTORS Some people are more likely than others to develop PE. Risk factors include:   Smoking.  Being overweight (obese).  Sitting or lying still for a long time. This includes long-distance travel, paralysis, or recovery from an illness or surgery. Other factors that increase risk are:   Older age, especially over 105 years of age.  Having a family history of blood clots or if you have already had a blood clot.  Having major or lengthy surgery. This is especially true for surgery on the hip, knee, or belly (abdomen). Hip surgery is particularly high risk.  Having a long, thin tube (catheter) placed inside a vein during a medical procedure.  Breaking a hip or leg.  Having cancer or cancer treatment.  Medicines containing the female hormone estrogen. This includes birth control pills and hormone replacement therapy.  Other circulation  or heart problems.  Pregnancy and childbirth.  Hormone changes make the blood clot more easily during pregnancy.  The fetus puts pressure on the veins of the pelvis.  There is a risk of injury to veins during delivery or a caesarean delivery. The risk is highest just after childbirth.  PREVENTION   Exercise the legs regularly. Take a brisk 30 minute walk every day.  Maintain a weight that is appropriate for your height.  Avoid sitting or lying in bed for long periods of time without moving your legs.  Women, particularly those over the age of 28 years, should consider the risks and benefits of taking estrogen medicines, including birth control pills.  Do not smoke, especially if you take estrogen medicines.  Long-distance travel can increase your risk. You should exercise your legs by walking or pumping the muscles every hour.  Many of the risk factors above relate to situations that exist with hospitalization, either for illness, injury, or elective surgery. Prevention may include medical and nonmedical measures.   Your health care provider will assess you for the need for venous thromboembolism prevention when you are admitted to the hospital. If you are having surgery, your surgeon will assess you the day of  or day after surgery.  SYMPTOMS  The symptoms of a PE usually start suddenly and include:  Shortness of breath.  Coughing.  Coughing up blood or blood-tinged mucus.  Chest pain. Pain is often worse with deep breaths.  Rapid heartbeat. DIAGNOSIS  If a PE is suspected, your health care provider will take a medical history and perform a physical exam. Other tests that may be required include:  Blood tests, such as studies of the clotting properties of your blood.  Imaging tests, such as ultrasound, CT, MRI, and other tests to see if you have clots in your legs or lungs.  An electrocardiogram. This can look for heart strain from blood clots in the lungs. TREATMENT    The most common treatment for a PE is blood thinning (anticoagulant) medicine, which reduces the blood's tendency to clot. Anticoagulants can stop new blood clots from forming and old clots from growing. They cannot dissolve existing clots. Your body does this by itself over time. Anticoagulants can be given by mouth, through an intravenous (IV) tube, or by injection. Your health care provider will determine the best program for you.  Less commonly, clot-dissolving medicines (thrombolytics) are used to dissolve a PE. They carry a high risk of bleeding, so they are used mainly in severe cases.  Very rarely, a blood clot in the leg needs to be removed surgically.  If you are unable to take anticoagulants, your health care provider may arrange for you to have a filter placed in a main vein in your abdomen. This filter prevents clots from traveling to your lungs. HOME CARE INSTRUCTIONS   Take all medicines as directed by your health care provider.  Learn as much as you can about DVT.  Wear a medical alert bracelet or carry a medical alert card.  Ask your health care provider how soon you can go back to normal activities. It is important to stay active to prevent blood clots. If you are on anticoagulant medicine, avoid contact sports.  It is very important to exercise. This is especially important while traveling, sitting, or standing for long periods of time. Exercise your legs by walking or by tightening and relaxing your leg muscles regularly. Take frequent walks.  You may need to wear compression stockings. These are tight elastic stockings that apply pressure to the lower legs. This pressure can help keep the blood in the legs from clotting. Taking Warfarin Warfarin is a daily medicine that is taken by mouth. Your health care provider will advise you on the length of treatment (usually 3-6 months, sometimes lifelong). If you take warfarin:  Understand how to take warfarin and foods that  can affect how warfarin works in Veterinary surgeon.  Too much and too little warfarin are both dangerous. Too much warfarin increases the risk of bleeding. Too little warfarin continues to allow the risk for blood clots. Warfarin and Regular Blood Testing While taking warfarin, you will need to have regular blood tests to measure your blood clotting time. These blood tests usually include both the prothrombin time (PT) and international normalized ratio (INR) tests. The PT and INR results allow your health care provider to adjust your dose of warfarin. It is very important that you have your PT and INR tested as often as directed by your health care provider.  Warfarin and Your Diet Avoid major changes in your diet, or notify your health care provider before changing your diet. Arrange a visit with a registered dietitian to answer your questions.  Many foods, especially foods high in vitamin K, can interfere with warfarin and affect the PT and INR results. You should eat a consistent amount of foods high in vitamin K. Foods high in vitamin K include:   Spinach, kale, broccoli, cabbage, collard and turnip greens, Brussels sprouts, peas, cauliflower, seaweed, and parsley.  Beef and pork liver.  Green tea.  Soybean oil. Warfarin with Other Medicines Many medicines can interfere with warfarin and affect the PT and INR results. You must:  Tell your health care provider about any and all medicines, vitamins, and supplements you take, including aspirin and other over-the-counter anti-inflammatory medicines. Be especially cautious with aspirin and anti-inflammatory medicines. Ask your health care provider before taking these.  Do not take or discontinue any prescribed or over-the-counter medicine except on the advice of your health care provider or pharmacist. Warfarin Side Effects Warfarin can have side effects, such as easy bruising and difficulty stopping bleeding. Ask your health care provider or pharmacist  about other side effects of warfarin. You will need to:  Hold pressure over cuts for longer than usual.  Notify your dentist and other health care providers that you are taking warfarin before you undergo any procedures where bleeding may occur. Warfarin with Alcohol and Tobacco   Drinking alcohol frequently can increase the effect of warfarin, leading to excess bleeding. It is best to avoid alcoholic drinks or consume only very small amounts while taking warfarin. Notify your health care provider if you change your alcohol intake.  Do not use any tobacco products including cigarettes, chewing tobacco, or electronic cigarettes. If you smoke, quit. Ask your health care provider for help with quitting smoking. Alternative Medicines to Warfarin: Factor Xa Inhibitor Medicines  These blood thinning medicines are taken by mouth, usually for several weeks or longer. It is important to take the medicine every single day, at the same time each day.  There are no regular blood tests required when using these medicines.  There are fewer food and drug interactions than with warfarin.  The side effects of this class of medicine is similar to that of warfarin, including excessive bruising or bleeding. Ask your health care provider or pharmacist about other potential side effects. SEEK MEDICAL CARE IF:   You notice a rapid heartbeat.  You feel weaker or more tired than usual.  You feel faint.  You notice increased bruising.  Your symptoms are not getting better in the time expected.  You are having side effects of medicine. SEEK IMMEDIATE MEDICAL CARE IF:   You have chest pain.  You have trouble breathing.  You have new or increased swelling or pain in one leg.  You cough up blood.  You notice blood in vomit, in a bowel movement, or in urine.  You have a fever. Symptoms of PE may represent a serious problem that is an emergency. Do not wait to see if the symptoms will go away. Get  medical help right away. Call your local emergency services (911 in the [place]). Do not drive yourself to the hospital. Document Released: 08/27/2000 Document Revised: 01/14/2014 Document Reviewed: 09/10/2013 [facility] Patient Information 2015 [place], [place]. This information is not intended to replace advice given to you by your health care provider. Make sure you discuss any questions you have with your health care provider.

## 2015-03-05 NOTE — Progress Notes (Signed)
Assessment and Plan: 74 yr WF with complicated history, being treated for Mantle Cell Lymphoma with DVT on xarelto in April presents with fatigue. Vitals are stable other than tachycardia, EKG is without changes. She has had normal Echo. Possible could be chemo related however with her stories she was encouraged to go to the ER for a CT scan of her chest to rule out PE/MI, she understands the risk of PE including death but declines the ER at this time. We will order a CT chest PE protocol due to her hypercoagulable state, we will check her labs including repeat urine, and we will refer her to cardiology for possible stress test. Patient understands if any worsening CP, SOB, dizziness go to the ER.   HPI 74 y.o.female with history of HTN, GERD, preDM, diverticulosis, anxiety, chol and vitamin D being treated for Mantle cell lymphoma at cancer center with Ennever, complicated by DVT of her right leg on 01/04/2015 currently on Xarelto presents for follow up from the hospital. Admit date to the hospital was 02/21/15, patient was discharged from the hospital on 02/22/15, patient was admitted for: elevated troponin and fever.   She went to the Er on the 10th with fever, fatigue, weakness. Had negative Ua, CXR, but did have + troponin with non acute EKG. Echo showed normal EF without wall motion abnormality. Blood cultures negative. The fever was thought to be from a virus versus mantle cell and she was discharged. She did have abnormal CBC, BMP which needs repeating.   She a chemo treatment last Wednesday, states that she normally recovers but for the last 5 days she has been very tired, unable to stand at the kitchen table due to fatigue. She states she can not stand for a long time without feeling shaky, exhausted and will have to sit down. She describes 1 episode this AM, while stirring oatmeal at the stove, she will feel a hot flush, overwhelming feeling of doom, gets shaky, some shortness of breath, some  palpitations and she will have to sit and wait 10-15 mins for it to pass. States this has happened 2-3 times in the past week, mainly in the AM. Most exertion during the day is making the bed. States that the core of her body feels " shaky, nervous turmoil".  Takes ativan 3 night a week before bed but will cause nightmares occ.   Also mentions chest pain, substernal to her back, scale of 3 out of 10, little pressure/stab going through her body, last for minutes. Intermittent since out of the hospital.   Blood pressure 110/72, pulse 96, temperature 97.9 F (36.6 C), resp. rate 16, height 5' 3.5" (1.613 m), weight 119 lb (53.978 kg), last menstrual period 09/14/2014.   Lab Results  Component Value Date   CREATININE 0.64 02/22/2015   BUN 13 02/22/2015   NA 136 02/22/2015   K 3.8 02/22/2015   CL 108 02/22/2015   CO2 23 02/22/2015   Lab Results  Component Value Date   WBC 6.5 02/22/2015   HGB 10.5* 02/22/2015   HCT 32.7* 02/22/2015   MCV 95.6 02/22/2015   PLT 120* 02/22/2015   Lab Results  Component Value Date   CKTOTAL 43 02/21/2015   TROPONINI 0.28* 02/22/2015   Lab Results  Component Value Date   TSH 3.049 12/16/2014   Images while in the hospital: Dg Chest 2 View  02/21/2015   CLINICAL DATA:  Fever since this morning. Undergoing chemotherapy for lymphoma.  EXAM: CHEST  2  VIEW  COMPARISON:  04/09/2014  FINDINGS: The cardiac silhouette, mediastinal and hilar contours are within normal limits and stable. Aortic calcifications are noted. The power port is in good position. No complicating features. The lungs are clear. No pleural effusion. The bony thorax is intact.  IMPRESSION: No acute cardiopulmonary findings.   Electronically Signed   By: Marijo Sanes M.D.   On: 02/21/2015 09:43    Past Medical History  Diagnosis Date  . OSA (obstructive sleep apnea)   . Hiatal hernia   . IBS (irritable bowel syndrome)   . Fibrocystic breast disease   . Asthma   . Diverticulosis   .  Adenomatous colon polyp 1994  . Internal hemorrhoids   . Melanoma     Facial  . Hyperlipidemia   . Hypertension   . GERD (gastroesophageal reflux disease)   . Osteoporosis   . Osteoarthritis   . Vitamin D deficiency   . Prediabetes   . Unspecified hypothyroidism   . Family history of ischemic heart disease   . Mantle cell lymphoma 10/18/2014     Allergies  Allergen Reactions  . Actonel [Risedronate Sodium] Other (See Comments)    "made me choke"   . Fosamax [Alendronate] Other (See Comments)    "made me choke"  . Sulfa Antibiotics Other (See Comments)    Doesn't remember   . Tetracyclines & Related Rash      Current Outpatient Prescriptions on File Prior to Visit  Medication Sig Dispense Refill  . acetaminophen (TYLENOL) 325 MG tablet Take 325 mg by mouth every 6 (six) hours as needed (For pain.).     [name] beclomethasone (QVAR) 80 MCG/ACT inhaler Inhale 1 puff into the lungs 2 (two) times daily.     . Cholecalciferol (VITAMIN D) 2000 UNITS CAPS Take 2,000-4,000 capsules by mouth every other day. Patient alternates 1 cap with 2 caps every other day.    . hyoscyamine (LEVSIN, ANASPAZ) 0.125 MG tablet Take 1 to 2 tablets every 4 hours as needed for nausea, cramping, bloating or diarrhea 90 tablet 11  . levothyroxine (SYNTHROID, LEVOTHROID) 50 MCG tablet TAKE 1 TABLET BY MOUTH ONCE DAILY. 90 tablet 3  . lidocaine-prilocaine (EMLA) cream Apply 1 application topically as needed. Apply to [facility] one hour before access. 30 g 3  . LORazepam (ATIVAN) 0.5 MG tablet Take 1 tablet (0.5 mg total) by mouth every 6 (six) hours as needed (Nausea or vomiting). 30 tablet 0  . ondansetron (ZOFRAN) 8 MG tablet Take 1 tablet (8 mg total) by mouth 2 (two) times daily. Start the day after chemo for 2 days. Then take as needed for nausea or vomiting. 30 tablet 1  . PRESCRIPTION MEDICATION Patient receives her treatments at the [facility] in [facility] with Dr Marin Olp. Her last dose of Bendeka  100mg  and Rituxan 600mg  was on 02/20/15.    . Probiotic Product (PROBIOTIC DAILY PO) Take 1 capsule by mouth daily.    . prochlorperazine (COMPAZINE) 10 MG tablet Take 1 tablet (10 mg total) by mouth every 6 (six) hours as needed (Nausea or vomiting). 30 tablet 1  . ranitidine (ZANTAC) 300 MG tablet Take 300 mg by mouth daily as needed for heartburn.     . rivaroxaban (XARELTO) 20 MG TABS tablet Take 1 tablet (20 mg total) by mouth daily with supper. 30 tablet 4   No current facility-administered medications on file prior to visit.    ROS: all negative except above.   Physical Exam:  Filed Weights   03/05/15 1116  Weight: 119 lb (53.978 kg)   BP 110/72 mmHg  Pulse 96  Temp(Src) 97.9 F (36.6 C)  Resp 16  Ht 5' 3.5" (1.613 m)  Wt 119 lb (53.978 kg)  BMI 20.75 kg/m2  LMP 09/14/2014 General Appearance: thin, sick appearing, in no apparent distress. Eyes: PERRLA, EOMs, conjunctiva no swelling or erythema Sinuses: No Frontal/maxillary tenderness ENT/Mouth: Ext aud canals clear, TMs without erythema, bulging. No erythema, swelling, or exudate on post pharynx.  Tonsils not swollen or erythematous. Hearing normal.  Neck: Supple, thyroid normal.  Respiratory: Respiratory effort normal, BS equal bilaterally without rales, rhonchi, wheezing or stridor.  Cardio: RRR with no MRGs, occ PVC. Brisk peripheral pulses mild bilateral edema.  Abdomen: Soft, + BS. Mild epigsatric tenderness, no guarding, rebound, hernias, masses. Lymphatics: Non tender without lymphadenopathy.  Musculoskeletal: Full ROM, 5/5 strength, slow, antalgic gait Skin: Warm, dry without rashes, lesions, ecchymosis.  Neuro: Cranial nerves intact. Normal muscle tone, no cerebellar symptoms. Sensation intact.  Psych: Awake and oriented X 3, normal affect, Insight and Judgment appropriate.     Vicie Mutters, PA-C 11:23 AM [facility]

## 2015-03-06 ENCOUNTER — Ambulatory Visit: Payer: Self-pay | Admitting: Physician Assistant

## 2015-03-06 ENCOUNTER — Ambulatory Visit
Admission: RE | Admit: 2015-03-06 | Discharge: 2015-03-06 | Disposition: A | Discharge status: Home / Self Care | Payer: Medicare Other | Source: Ambulatory Visit | Attending: Physician Assistant | Admitting: Physician Assistant

## 2015-03-06 DIAGNOSIS — R079 Chest pain, unspecified: Secondary | ICD-10-CM

## 2015-03-06 DIAGNOSIS — R0602 Shortness of breath: Secondary | ICD-10-CM

## 2015-03-06 LAB — URINALYSIS, ROUTINE W REFLEX MICROSCOPIC
BILIRUBIN URINE: NEGATIVE
GLUCOSE, UA: NEGATIVE mg/dL
Hgb urine dipstick: NEGATIVE
KETONES UR: NEGATIVE mg/dL
Leukocytes, UA: NEGATIVE
Nitrite: NEGATIVE
PH: 6.5 (ref 5.0–8.0)
Protein, ur: NEGATIVE mg/dL
Specific Gravity, Urine: 1.015 (ref 1.005–1.030)
Urobilinogen, UA: 0.2 mg/dL (ref 0.0–1.0)

## 2015-03-06 LAB — HEPATIC FUNCTION PANEL
ALBUMIN: 4.1 g/dL (ref 3.5–5.2)
ALT: 11 U/L (ref 0–35)
AST: 18 U/L (ref 0–37)
Alkaline Phosphatase: 49 U/L (ref 39–117)
BILIRUBIN TOTAL: 0.4 mg/dL (ref 0.2–1.2)
Bilirubin, Direct: 0.1 mg/dL (ref 0.0–0.3)
Indirect Bilirubin: 0.3 mg/dL (ref 0.2–1.2)
TOTAL PROTEIN: 6.5 g/dL (ref 6.0–8.3)

## 2015-03-06 LAB — BASIC METABOLIC PANEL WITH GFR
BUN: 15 mg/dL (ref 6–23)
CALCIUM: 9.3 mg/dL (ref 8.4–10.5)
CHLORIDE: 102 meq/L (ref 96–112)
CO2: 22 mEq/L (ref 19–32)
CREATININE: 0.81 mg/dL (ref 0.50–1.10)
GFR, EST NON AFRICAN AMERICAN: 73 mL/min
GFR, Est African American: 85 mL/min
Glucose, Bld: 89 mg/dL (ref 70–99)
Potassium: 3.7 mEq/L (ref 3.5–5.3)
Sodium: 136 mEq/L (ref 135–145)

## 2015-03-06 LAB — URINE CULTURE
Colony Count: NO GROWTH
ORGANISM ID, BACTERIA: NO GROWTH

## 2015-03-06 LAB — TSH: TSH: 2.784 u[IU]/mL (ref 0.350–4.500)

## 2015-03-06 LAB — AMYLASE: Amylase: 70 U/L (ref 0–105)

## 2015-03-06 MED ORDER — IOPAMIDOL (ISOVUE-370) INJECTION 76%
100.0000 mL | Freq: Once | INTRAVENOUS | Status: AC | PRN
  Administered 2015-03-06: 100 mL via INTRAVENOUS

## 2015-03-10 ENCOUNTER — Encounter: Payer: Self-pay | Admitting: Internal Medicine

## 2015-03-10 ENCOUNTER — Other Ambulatory Visit: Payer: Self-pay | Admitting: Hematology & Oncology

## 2015-03-13 ENCOUNTER — Encounter (HOSPITAL_COMMUNITY)
Admit: 2015-03-13 | Discharge: 2015-03-13 | Disposition: A | Discharge status: Home / Self Care | Payer: Medicare Other | Attending: Hematology & Oncology | Admitting: Hematology & Oncology

## 2015-03-13 ENCOUNTER — Ambulatory Visit (HOSPITAL_COMMUNITY)
Admission: RE | Admit: 2015-03-13 | Discharge: 2015-03-13 | Disposition: A | Discharge status: Home / Self Care | Payer: Medicare Other | Source: Ambulatory Visit | Attending: Hematology & Oncology | Admitting: Hematology & Oncology

## 2015-03-13 DIAGNOSIS — Z86718 Personal history of other venous thrombosis and embolism: Secondary | ICD-10-CM | POA: Diagnosis not present

## 2015-03-13 DIAGNOSIS — Z01818 Encounter for other preprocedural examination: Secondary | ICD-10-CM | POA: Diagnosis not present

## 2015-03-13 DIAGNOSIS — C8310 Mantle cell lymphoma, unspecified site: Secondary | ICD-10-CM

## 2015-03-13 DIAGNOSIS — I82401 Acute embolism and thrombosis of unspecified deep veins of right lower extremity: Secondary | ICD-10-CM | POA: Diagnosis not present

## 2015-03-13 LAB — GLUCOSE, CAPILLARY: Glucose-Capillary: 81 mg/dL (ref 65–99)

## 2015-03-13 MED ORDER — FLUDEOXYGLUCOSE F - 18 (FDG) INJECTION
5.9200 | Freq: Once | INTRAVENOUS | Status: AC | PRN
  Administered 2015-03-13: 5.92 via INTRAVENOUS

## 2015-03-13 NOTE — Progress Notes (Signed)
VASCULAR LAB PRELIMINARY  PRELIMINARY  PRELIMINARY  PRELIMINARY  Right lower extremity venous duplex completed.    Preliminary report:  Right:  No evidence of DVT, superficial thrombosis, or Baker's cyst.  Previous DVT appears completely resolved except for very mild chronic changes noted in the popliteal vein.  Abeer Iversen, RVT 03/13/2015, 11:27 AM

## 2015-03-14 ENCOUNTER — Telehealth: Payer: Self-pay | Admitting: *Deleted

## 2015-03-14 NOTE — Telephone Encounter (Addendum)
Patient aware of results.   ----- Message from Volanda Napoleon, MD sent at 03/14/2015  6:53 AM EDT ----- Call - PET scan is normal!!!!   NO active lymphoma!!!!  Happy July 4th!!!!  Pete  Call - No more clot in the right leg!!! pete

## 2015-03-20 ENCOUNTER — Ambulatory Visit (HOSPITAL_BASED_OUTPATIENT_CLINIC_OR_DEPARTMENT_OTHER): Payer: Medicare Other | Admitting: Hematology & Oncology

## 2015-03-20 ENCOUNTER — Other Ambulatory Visit (HOSPITAL_BASED_OUTPATIENT_CLINIC_OR_DEPARTMENT_OTHER): Payer: Medicare Other

## 2015-03-20 ENCOUNTER — Encounter: Payer: Self-pay | Admitting: Hematology & Oncology

## 2015-03-20 ENCOUNTER — Ambulatory Visit: Payer: Medicare Other

## 2015-03-20 VITALS — BP 114/65 | HR 101 | Temp 97.9°F | Resp 16 | Ht 63.5 in | Wt 119.0 lb

## 2015-03-20 DIAGNOSIS — C8310 Mantle cell lymphoma, unspecified site: Secondary | ICD-10-CM | POA: Diagnosis not present

## 2015-03-20 LAB — LACTATE DEHYDROGENASE: LDH: 204 U/L (ref 94–250)

## 2015-03-20 LAB — CBC WITH DIFFERENTIAL (CANCER CENTER ONLY)
BASO#: 0 10*3/uL (ref 0.0–0.2)
BASO%: 0.2 % (ref 0.0–2.0)
EOS%: 7.7 % — ABNORMAL HIGH (ref 0.0–7.0)
Eosinophils Absolute: 0.5 10*3/uL (ref 0.0–0.5)
HCT: 37.9 % (ref 34.8–46.6)
HEMOGLOBIN: 13.2 g/dL (ref 11.6–15.9)
LYMPH#: 0.2 10*3/uL — ABNORMAL LOW (ref 0.9–3.3)
LYMPH%: 3.5 % — ABNORMAL LOW (ref 14.0–48.0)
MCH: 31.7 pg (ref 26.0–34.0)
MCHC: 34.8 g/dL (ref 32.0–36.0)
MCV: 91 fL (ref 81–101)
MONO#: 1 10*3/uL — ABNORMAL HIGH (ref 0.1–0.9)
MONO%: 15.1 % — AB (ref 0.0–13.0)
NEUT#: 4.7 10*3/uL (ref 1.5–6.5)
NEUT%: 73.5 % (ref 39.6–80.0)
PLATELETS: 219 10*3/uL (ref 145–400)
RBC: 4.17 10*6/uL (ref 3.70–5.32)
RDW: 14.3 % (ref 11.1–15.7)
WBC: 6.4 10*3/uL (ref 3.9–10.0)

## 2015-03-20 LAB — CMP (CANCER CENTER ONLY)
ALK PHOS: 48 U/L (ref 26–84)
ALT: 34 U/L (ref 10–47)
AST: 29 U/L (ref 11–38)
Albumin: 3.4 g/dL (ref 3.3–5.5)
BILIRUBIN TOTAL: 0.7 mg/dL (ref 0.20–1.60)
BUN, Bld: 14 mg/dL (ref 7–22)
CALCIUM: 9.5 mg/dL (ref 8.0–10.3)
CO2: 22 mEq/L (ref 18–33)
CREATININE: 1.2 mg/dL (ref 0.6–1.2)
Chloride: 104 mEq/L (ref 98–108)
Glucose, Bld: 98 mg/dL (ref 73–118)
Potassium: 3.1 mEq/L — ABNORMAL LOW (ref 3.3–4.7)
Sodium: 133 mEq/L (ref 128–145)
Total Protein: 6.2 g/dL — ABNORMAL LOW (ref 6.4–8.1)

## 2015-03-20 NOTE — Progress Notes (Signed)
Hematology and Oncology Follow Up Visit  Shannon Parsons [id number] 09/28/1940 74 y.o. 03/20/2015   Principle Diagnosis:   Mantle cell lymphoma (MCIPI = 5)  Thromboembolic event of the right leg  Current Therapy:    Patient s/p cycle #2 of Treanda/Rituxan  Xarelto 20 mg by mouth daily     Interim History:  [name] is back for follow-up. It looks like she may develop some type of gastroenteritis yesterday. She had nausea and vomiting. She's having some diarrhea. She thinks it was him that she ate.  Because of this, I don't think we need to treat her today. As such we will refer back 1 week. A she had a PET scan done. PET scan showed no obvious lymphoma. There is some slight activity in the lung. It was felt to be inflammatory area  She did end up in the hospital for 1 day after her last cycle of treatment. Not sure exactly what happened. Cultures were all negative. She'll of the temperature but this improved.  Before yesterday, she was doing well. She had a good July 4 holiday.  She's had no fever. She's had no cough. She's had no bleeding.  We did do a Doppler of her right leg. There is no residual thrombus that was noted. I want to keep her on Xarelto for right now.  Her appetite have a doing okay. She is on some Megace area and her weight is holding steady.  Overall, I would say her performance status is ECOG 1-2. i  Medications:  Current outpatient prescriptions:  .  acetaminophen (TYLENOL) 325 MG tablet, Take 325 mg by mouth every 6 (six) hours as needed (For pain.). , Disp: , Rfl:  .  beclomethasone (QVAR) 80 MCG/ACT inhaler, Inhale 1 puff into the lungs 2 (two) times daily. , Disp: , Rfl:  .  Cholecalciferol (VITAMIN D) 2000 UNITS CAPS, Take 2,000-4,000 capsules by mouth every other day. Patient alternates 1 cap with 2 caps every other day., Disp: , Rfl:  .  hyoscyamine (LEVSIN, ANASPAZ) 0.125 MG tablet, Take 1 to 2 tablets every 4 hours as needed for nausea,  cramping, bloating or diarrhea, Disp: 90 tablet, Rfl: 11 .  levothyroxine (SYNTHROID, LEVOTHROID) 50 MCG tablet, TAKE 1 TABLET BY MOUTH ONCE DAILY., Disp: 90 tablet, Rfl: 3 .  lidocaine-prilocaine (EMLA) cream, Apply 1 application topically as needed. Apply to [facility] one hour before access., Disp: 30 g, Rfl: 3 .  LORazepam (ATIVAN) 0.5 MG tablet, Take 1 tablet (0.5 mg total) by mouth every 6 (six) hours as needed (Nausea or vomiting)., Disp: 30 tablet, Rfl: 0 .  megestrol (MEGACE) 40 MG/ML suspension, TAKE 10 MLS (400 MG TOTAL) BY MOUTH 2 TIMES DAILY., Disp: 480 mL, Rfl: 0 .  ondansetron (ZOFRAN) 8 MG tablet, Take 1 tablet (8 mg total) by mouth 2 (two) times daily. Start the day after chemo for 2 days. Then take as needed for nausea or vomiting., Disp: 30 tablet, Rfl: 1 .  PRESCRIPTION MEDICATION, Patient receives her treatments at the [facility] in [facility] with Dr Marin Olp. Her last dose of Bendeka 100mg  and Rituxan 600mg  was on 02/20/15., Disp: , Rfl:  .  Probiotic Product (PROBIOTIC DAILY PO), Take 1 capsule by mouth daily., Disp: , Rfl:  .  prochlorperazine (COMPAZINE) 10 MG tablet, Take 1 tablet (10 mg total) by mouth every 6 (six) hours as needed (Nausea or vomiting)., Disp: 30 tablet, Rfl: 1 .  ranitidine (ZANTAC) 300 MG  tablet, Take 300 mg by mouth daily as needed for heartburn. , Disp: , Rfl:  .  rivaroxaban (XARELTO) 20 MG TABS tablet, Take 1 tablet (20 mg total) by mouth daily with supper., Disp: 30 tablet, Rfl: 4  Allergies:  Allergies  Allergen Reactions  . Actonel [Risedronate Sodium] Other (See Comments)    "made me choke"   . Fosamax [Alendronate] Other (See Comments)    "made me choke"  . Sulfa Antibiotics Other (See Comments)    Doesn't remember   . Tetracyclines & Related Rash    Past Medical History, Surgical history, Social history, and Family History were reviewed and updated.  Review of Systems: As above  Physical Exam:  height is 5' 3.5" (1.613 m) and  weight is 119 lb (53.978 kg). Her oral temperature is 97.9 F (36.6 C). Her blood pressure is 114/65 and her pulse is 101. Her respiration is 16.   Well-developed and well-nourished white female. Head and neck exam shows no ocular or oral lesions. She has no bilateral cervical or supraclavicular lymph nodes. She has no palpable thyroid. Lungs are clear. Cardiac exam regular rate and rhythm with no murmurs, rubs or bruits. Axillary exam shows no obvious bilateral axillary adenopathy. Abdomen is soft. She has decent bowel sounds. There is no fluid wave. Her spleen tip is just below the left costal margin. There is no hepatomegaly. Back exam shows no tenderness over the spine, ribs or hips. Extremities shows no clubbing, cyanosis or edema. No palpable venous cord is noted in the right leg. She has good range of motion of her joints. She has good strength in her muscles. Neurological exam is nonfocal. Skin exam shows no rashes, ecchymoses or petechia  Lab Results  Component Value Date   WBC 6.4 03/20/2015   HGB 13.2 03/20/2015   HCT 37.9 03/20/2015   MCV 91 03/20/2015   PLT 219 03/20/2015     Chemistry      Component Value Date/Time   NA 136 03/05/2015 1218   NA 140 02/20/2015 0813   K 3.7 03/05/2015 1218   K 4.1 02/20/2015 0813   CL 102 03/05/2015 1218   CL 101 02/20/2015 0813   CO2 22 03/05/2015 1218   CO2 27 02/20/2015 0813   BUN 15 03/05/2015 1218   BUN 11 02/20/2015 0813   CREATININE 0.81 03/05/2015 1218   CREATININE 0.64 02/22/2015 0405      Component Value Date/Time   CALCIUM 9.3 03/05/2015 1218   CALCIUM 10.0 02/20/2015 0813   ALKPHOS 49 03/05/2015 1218   ALKPHOS 47 02/20/2015 0813   AST 18 03/05/2015 1218   AST 32 02/20/2015 0813   ALT 11 03/05/2015 1218   ALT 24 02/20/2015 0813   BILITOT 0.4 03/05/2015 1218   BILITOT 0.70 02/20/2015 0813         Impression and Plan: Shannon Parsons is 74 year old white female. She has mantle cell lymphoma. Again, by the [facility] she is  an intermediate risk.  She has responded very well. Her last PET scan looks excellent. There is that slight activity in the lung which I would suspect would be inflammatory in nature.  We will have to hold her chemotherapy this week. I think that this gastroenteritis that she has is all that she needs to deal with right now. I don't want to add chemotherapy into the "mix" that would cause more problems for her.  We will go ahead and just have her start treatment with the third cycle next  week.  I will plan to see her back myself after her next chemotherapy.  I spent about 45 minutes with she and her husband.   Volanda Napoleon, MD 7/7/20168:35 AM

## 2015-03-21 ENCOUNTER — Ambulatory Visit: Payer: Medicare Other

## 2015-03-25 ENCOUNTER — Ambulatory Visit: Payer: Medicare Other | Admitting: Hematology & Oncology

## 2015-03-26 ENCOUNTER — Ambulatory Visit (HOSPITAL_BASED_OUTPATIENT_CLINIC_OR_DEPARTMENT_OTHER): Payer: Medicare Other

## 2015-03-26 ENCOUNTER — Other Ambulatory Visit (HOSPITAL_BASED_OUTPATIENT_CLINIC_OR_DEPARTMENT_OTHER): Payer: Medicare Other

## 2015-03-26 VITALS — BP 120/56 | HR 68 | Temp 98.1°F | Resp 18 | Ht 63.5 in | Wt 122.0 lb

## 2015-03-26 DIAGNOSIS — C8310 Mantle cell lymphoma, unspecified site: Secondary | ICD-10-CM

## 2015-03-26 DIAGNOSIS — Z5112 Encounter for antineoplastic immunotherapy: Secondary | ICD-10-CM

## 2015-03-26 DIAGNOSIS — Z5111 Encounter for antineoplastic chemotherapy: Secondary | ICD-10-CM | POA: Diagnosis not present

## 2015-03-26 LAB — CBC WITH DIFFERENTIAL (CANCER CENTER ONLY)
BASO#: 0 10*3/uL (ref 0.0–0.2)
BASO%: 0.7 % (ref 0.0–2.0)
EOS ABS: 0.4 10*3/uL (ref 0.0–0.5)
EOS%: 9.1 % — ABNORMAL HIGH (ref 0.0–7.0)
HEMATOCRIT: 35.9 % (ref 34.8–46.6)
HGB: 12.5 g/dL (ref 11.6–15.9)
LYMPH#: 0.4 10*3/uL — AB (ref 0.9–3.3)
LYMPH%: 8.9 % — ABNORMAL LOW (ref 14.0–48.0)
MCH: 31.4 pg (ref 26.0–34.0)
MCHC: 34.8 g/dL (ref 32.0–36.0)
MCV: 90 fL (ref 81–101)
MONO#: 1 10*3/uL — ABNORMAL HIGH (ref 0.1–0.9)
MONO%: 22.4 % — ABNORMAL HIGH (ref 0.0–13.0)
NEUT%: 58.9 % (ref 39.6–80.0)
NEUTROS ABS: 2.5 10*3/uL (ref 1.5–6.5)
Platelets: 227 10*3/uL (ref 145–400)
RBC: 3.98 10*6/uL (ref 3.70–5.32)
RDW: 14.3 % (ref 11.1–15.7)
WBC: 4.3 10*3/uL (ref 3.9–10.0)

## 2015-03-26 LAB — CMP (CANCER CENTER ONLY)
ALBUMIN: 3.3 g/dL (ref 3.3–5.5)
ALT: 24 U/L (ref 10–47)
AST: 28 U/L (ref 11–38)
Alkaline Phosphatase: 49 U/L (ref 26–84)
BILIRUBIN TOTAL: 0.6 mg/dL (ref 0.20–1.60)
BUN, Bld: 12 mg/dL (ref 7–22)
CO2: 24 mEq/L (ref 18–33)
Calcium: 9.5 mg/dL (ref 8.0–10.3)
Chloride: 105 mEq/L (ref 98–108)
Creat: 0.7 mg/dl (ref 0.6–1.2)
Glucose, Bld: 78 mg/dL (ref 73–118)
Potassium: 3.6 mEq/L (ref 3.3–4.7)
SODIUM: 134 meq/L (ref 128–145)
Total Protein: 6 g/dL — ABNORMAL LOW (ref 6.4–8.1)

## 2015-03-26 LAB — TECHNOLOGIST REVIEW CHCC SATELLITE

## 2015-03-26 LAB — LACTATE DEHYDROGENASE: LDH: 231 U/L (ref 94–250)

## 2015-03-26 MED ORDER — ACETAMINOPHEN 325 MG PO TABS
650.0000 mg | ORAL_TABLET | Freq: Once | ORAL | Status: AC
  Administered 2015-03-26: 650 mg via ORAL

## 2015-03-26 MED ORDER — SODIUM CHLORIDE 0.9 % IV SOLN
Freq: Once | INTRAVENOUS | Status: AC
  Administered 2015-03-26: 09:00:00 via INTRAVENOUS
  Filled 2015-03-26: qty 4

## 2015-03-26 MED ORDER — SODIUM CHLORIDE 0.9 % IJ SOLN
10.0000 mL | INTRAMUSCULAR | Status: DC | PRN
  Administered 2015-03-26: 10 mL
  Filled 2015-03-26: qty 10

## 2015-03-26 MED ORDER — HEPARIN SOD (PORK) LOCK FLUSH 100 UNIT/ML IV SOLN
500.0000 [IU] | Freq: Once | INTRAVENOUS | Status: AC | PRN
  Administered 2015-03-26: 500 [IU]
  Filled 2015-03-26: qty 5

## 2015-03-26 MED ORDER — SODIUM CHLORIDE 0.9 % IV SOLN
375.0000 mg/m2 | Freq: Once | INTRAVENOUS | Status: AC
  Administered 2015-03-26: 600 mg via INTRAVENOUS
  Filled 2015-03-26: qty 60

## 2015-03-26 MED ORDER — SODIUM CHLORIDE 0.9 % IV SOLN
90.0000 mg/m2 | Freq: Once | INTRAVENOUS | Status: AC
  Administered 2015-03-26: 150 mg via INTRAVENOUS
  Filled 2015-03-26: qty 6

## 2015-03-26 MED ORDER — DIPHENHYDRAMINE HCL 25 MG PO CAPS
50.0000 mg | ORAL_CAPSULE | Freq: Once | ORAL | Status: AC
  Administered 2015-03-26: 50 mg via ORAL

## 2015-03-26 MED ORDER — ACETAMINOPHEN 325 MG PO TABS
ORAL_TABLET | ORAL | Status: AC
Start: 2015-03-26 — End: 2015-03-26
  Filled 2015-03-26: qty 2

## 2015-03-26 MED ORDER — DIPHENHYDRAMINE HCL 25 MG PO CAPS
ORAL_CAPSULE | ORAL | Status: AC
  Filled 2015-03-26: qty 2

## 2015-03-26 MED ORDER — SODIUM CHLORIDE 0.9 % IV SOLN
Freq: Once | INTRAVENOUS | Status: AC
  Administered 2015-03-26: 09:00:00 via INTRAVENOUS

## 2015-03-26 NOTE — Patient Instructions (Signed)
Bendamustine Injection What is this medicine? BENDAMUSTINE (BEN da MUS teen) is a chemotherapy drug. It is used to treat chronic lymphocytic leukemia and non-Hodgkin lymphoma. This medicine may be used for other purposes; ask your health care provider or pharmacist if you have questions. COMMON BRAND NAME(S): Treanda What should I tell my health care provider before I take this medicine? They need to know if you have any of these conditions: -kidney disease -liver disease -an unusual or allergic reaction to bendamustine, mannitol, other medicines, foods, dyes, or preservatives -pregnant or trying to get pregnant -breast-feeding How should I use this medicine? This medicine is for infusion into a vein. It is given by a health care professional in a hospital or clinic setting. Talk to your pediatrician regarding the use of this medicine in children. Special care may be needed. Overdosage: If you think you have taken too much of this medicine contact a poison control center or emergency room at once. NOTE: This medicine is only for you. Do not share this medicine with others. What if I miss a dose? It is important not to miss your dose. Call your doctor or health care professional if you are unable to keep an appointment. What may interact with this medicine? Do not take this medicine with any of the following medications: -clozapine This medicine may also interact with the following medications: -atazanavir -cimetidine -ciprofloxacin -enoxacin -fluvoxamine -medicines for seizures like carbamazepine and phenobarbital -mexiletine -rifampin -tacrine -thiabendazole -zileuton This list may not describe all possible interactions. Give your health care provider a list of all the medicines, herbs, non-prescription drugs, or dietary supplements you use. Also tell them if you smoke, drink alcohol, or use illegal drugs. Some items may interact with your medicine. What should I watch for while  using this medicine? Your condition will be monitored carefully while you are receiving this medicine. This drug may make you feel generally unwell. This is not uncommon, as chemotherapy can affect healthy cells as well as cancer cells. Report any side effects. Continue your course of treatment even though you feel ill unless your doctor tells you to stop. Call your doctor or health care professional for advice if you get a fever, chills or sore throat, or other symptoms of a cold or flu. Do not treat yourself. This drug decreases your body's ability to fight infections. Try to avoid being around people who are sick. This medicine may increase your risk to bruise or bleed. Call your doctor or health care professional if you notice any unusual bleeding. Be careful brushing and flossing your teeth or using a toothpick because you may get an infection or bleed more easily. If you have any dental work done, tell your dentist you are receiving this medicine. Avoid taking products that contain aspirin, acetaminophen, ibuprofen, naproxen, or ketoprofen unless instructed by your doctor. These medicines may hide a fever. Do not become pregnant while taking this medicine. Women should inform their doctor if they wish to become pregnant or think they might be pregnant. There is a potential for serious side effects to an unborn child. Men should inform their doctors if they wish to father a child. This medicine may lower sperm counts. Talk to your health care professional or pharmacist for more information. Do not breast-feed an infant while taking this medicine. What side effects may I notice from receiving this medicine? Side effects that you should report to your doctor or health care professional as soon as possible: -allergic reactions like skin rash,   itching or hives, swelling of the face, lips, or tongue -low blood counts - this medicine may decrease the number of white blood cells, red blood cells and  platelets. You may be at increased risk for infections and bleeding. -signs of infection - fever or chills, cough, sore throat, pain or difficulty passing urine -signs of decreased platelets or bleeding - bruising, pinpoint red spots on the skin, black, tarry stools, blood in the urine -signs of decreased red blood cells - unusually weak or tired, fainting spells, lightheadedness -trouble passing urine or change in the amount of urine Side effects that usually do not require medical attention (report to your doctor or health care professional if they continue or are bothersome): -diarrhea This list may not describe all possible side effects. Call your doctor for medical advice about side effects. You may report side effects to FDA at 1-800-FDA-1088. Where should I keep my medicine? This drug is given in a hospital or clinic and will not be stored at home. NOTE: This sheet is a summary. It may not cover all possible information. If you have questions about this medicine, talk to your doctor, pharmacist, or health care provider.  2015, Elsevier/Gold Standard. (2011-11-26 14:15:47) Rituximab injection What is this medicine? RITUXIMAB (ri TUX i mab) is a monoclonal antibody. This medicine changes the way the body's immune system works. It is used commonly to treat non-Hodgkin's lymphoma and other conditions. In cancer cells, this drug targets a specific protein within cancer cells and stops the cancer cells from growing. It is also used to treat rhuematoid arthritis (RA). In RA, this medicine slow the inflammatory process and help reduce joint pain and swelling. This medicine is often used with other cancer or arthritis medications. This medicine may be used for other purposes; ask your health care provider or pharmacist if you have questions. COMMON BRAND NAME(S): Rituxan What should I tell my health care provider before I take this medicine? They need to know if you have any of these  conditions: -blood disorders -heart disease -history of hepatitis B -infection (especially a virus infection such as chickenpox, cold sores, or herpes) -irregular heartbeat -kidney disease -lung or breathing disease, like asthma -lupus -an unusual or allergic reaction to rituximab, mouse proteins, other medicines, foods, dyes, or preservatives -pregnant or trying to get pregnant -breast-feeding How should I use this medicine? This medicine is for infusion into a vein. It is administered in a hospital or clinic by a specially trained health care professional. A special MedGuide will be given to you by the pharmacist with each prescription and refill. Be sure to read this information carefully each time. Talk to your pediatrician regarding the use of this medicine in children. This medicine is not approved for use in children. Overdosage: If you think you have taken too much of this medicine contact a poison control center or emergency room at once. NOTE: This medicine is only for you. Do not share this medicine with others. What if I miss a dose? It is important not to miss a dose. Call your doctor or health care professional if you are unable to keep an appointment. What may interact with this medicine? -cisplatin -medicines for blood pressure -some other medicines for arthritis -vaccines This list may not describe all possible interactions. Give your health care provider a list of all the medicines, herbs, non-prescription drugs, or dietary supplements you use. Also tell them if you smoke, drink alcohol, or use illegal drugs. Some items may interact with   your medicine. What should I watch for while using this medicine? Report any side effects that you notice during your treatment right away, such as changes in your breathing, fever, chills, dizziness or lightheadedness. These effects are more common with the first dose. Visit your prescriber or health care professional for checks on your  progress. You will need to have regular blood work. Report any other side effects. The side effects of this medicine can continue after you finish your treatment. Continue your course of treatment even though you feel ill unless your doctor tells you to stop. Call your doctor or health care professional for advice if you get a fever, chills or sore throat, or other symptoms of a cold or flu. Do not treat yourself. This drug decreases your body's ability to fight infections. Try to avoid being around people who are sick. This medicine may increase your risk to bruise or bleed. Call your doctor or health care professional if you notice any unusual bleeding. Be careful brushing and flossing your teeth or using a toothpick because you may get an infection or bleed more easily. If you have any dental work done, tell your dentist you are receiving this medicine. Avoid taking products that contain aspirin, acetaminophen, ibuprofen, naproxen, or ketoprofen unless instructed by your doctor. These medicines may hide a fever. Do not become pregnant while taking this medicine. Women should inform their doctor if they wish to become pregnant or think they might be pregnant. There is a potential for serious side effects to an unborn child. Talk to your health care professional or pharmacist for more information. Do not breast-feed an infant while taking this medicine. What side effects may I notice from receiving this medicine? Side effects that you should report to your doctor or health care professional as soon as possible: -allergic reactions like skin rash, itching or hives, swelling of the face, lips, or tongue -low blood counts - this medicine may decrease the number of white blood cells, red blood cells and platelets. You may be at increased risk for infections and bleeding. -signs of infection - fever or chills, cough, sore throat, pain or difficulty passing urine -signs of decreased platelets or bleeding -  bruising, pinpoint red spots on the skin, black, tarry stools, blood in the urine -signs of decreased red blood cells - unusually weak or tired, fainting spells, lightheadedness -breathing problems -confused, not responsive -chest pain -fast, irregular heartbeat -feeling faint or lightheaded, falls -mouth sores -redness, blistering, peeling or loosening of the skin, including inside the mouth -stomach pain -swelling of the ankles, feet, or hands -trouble passing urine or change in the amount of urine Side effects that usually do not require medical attention (report to your doctor or other health care professional if they continue or are bothersome): -anxiety -headache -loss of appetite -muscle aches -nausea -night sweats This list may not describe all possible side effects. Call your doctor for medical advice about side effects. You may report side effects to FDA at 1-800-FDA-1088. Where should I keep my medicine? This drug is given in a hospital or clinic and will not be stored at home. NOTE: This sheet is a summary. It may not cover all possible information. If you have questions about this medicine, talk to your doctor, pharmacist, or health care provider.  2015, Elsevier/Gold Standard. (2008-04-29 14:04:59)  

## 2015-03-27 ENCOUNTER — Ambulatory Visit (HOSPITAL_BASED_OUTPATIENT_CLINIC_OR_DEPARTMENT_OTHER): Payer: Medicare Other

## 2015-03-27 VITALS — BP 121/65 | HR 64 | Temp 98.2°F | Resp 18

## 2015-03-27 DIAGNOSIS — C8310 Mantle cell lymphoma, unspecified site: Secondary | ICD-10-CM

## 2015-03-27 DIAGNOSIS — Z5111 Encounter for antineoplastic chemotherapy: Secondary | ICD-10-CM | POA: Diagnosis not present

## 2015-03-27 MED ORDER — HEPARIN SOD (PORK) LOCK FLUSH 100 UNIT/ML IV SOLN
500.0000 [IU] | Freq: Once | INTRAVENOUS | Status: AC | PRN
  Administered 2015-03-27: 500 [IU]
  Filled 2015-03-27: qty 5

## 2015-03-27 MED ORDER — SODIUM CHLORIDE 0.9 % IV SOLN
90.0000 mg/m2 | Freq: Once | INTRAVENOUS | Status: AC
  Administered 2015-03-27: 150 mg via INTRAVENOUS
  Filled 2015-03-27: qty 6

## 2015-03-27 MED ORDER — SODIUM CHLORIDE 0.9 % IJ SOLN
10.0000 mL | INTRAMUSCULAR | Status: DC | PRN
  Administered 2015-03-27: 10 mL
  Filled 2015-03-27: qty 10

## 2015-03-27 MED ORDER — SODIUM CHLORIDE 0.9 % IV SOLN
Freq: Once | INTRAVENOUS | Status: AC
  Administered 2015-03-27: 11:00:00 via INTRAVENOUS

## 2015-03-27 MED ORDER — SODIUM CHLORIDE 0.9 % IV SOLN
Freq: Once | INTRAVENOUS | Status: AC
  Administered 2015-03-27: 12:00:00 via INTRAVENOUS
  Filled 2015-03-27: qty 4

## 2015-03-27 NOTE — Patient Instructions (Signed)
Bendamustine Injection What is this medicine? BENDAMUSTINE (BEN da MUS teen) is a chemotherapy drug. It is used to treat chronic lymphocytic leukemia and non-Hodgkin lymphoma. This medicine may be used for other purposes; ask your health care provider or pharmacist if you have questions. COMMON BRAND NAME(S): Treanda What should I tell my health care provider before I take this medicine? They need to know if you have any of these conditions: -kidney disease -liver disease -an unusual or allergic reaction to bendamustine, mannitol, other medicines, foods, dyes, or preservatives -pregnant or trying to get pregnant -breast-feeding How should I use this medicine? This medicine is for infusion into a vein. It is given by a health care professional in a hospital or clinic setting. Talk to your pediatrician regarding the use of this medicine in children. Special care may be needed. Overdosage: If you think you have taken too much of this medicine contact a poison control center or emergency room at once. NOTE: This medicine is only for you. Do not share this medicine with others. What if I miss a dose? It is important not to miss your dose. Call your doctor or health care professional if you are unable to keep an appointment. What may interact with this medicine? Do not take this medicine with any of the following medications: -clozapine This medicine may also interact with the following medications: -atazanavir -cimetidine -ciprofloxacin -enoxacin -fluvoxamine -medicines for seizures like carbamazepine and phenobarbital -mexiletine -rifampin -tacrine -thiabendazole -zileuton This list may not describe all possible interactions. Give your health care provider a list of all the medicines, herbs, non-prescription drugs, or dietary supplements you use. Also tell them if you smoke, drink alcohol, or use illegal drugs. Some items may interact with your medicine. What should I watch for while  using this medicine? Your condition will be monitored carefully while you are receiving this medicine. This drug may make you feel generally unwell. This is not uncommon, as chemotherapy can affect healthy cells as well as cancer cells. Report any side effects. Continue your course of treatment even though you feel ill unless your doctor tells you to stop. Call your doctor or health care professional for advice if you get a fever, chills or sore throat, or other symptoms of a cold or flu. Do not treat yourself. This drug decreases your body's ability to fight infections. Try to avoid being around people who are sick. This medicine may increase your risk to bruise or bleed. Call your doctor or health care professional if you notice any unusual bleeding. Be careful brushing and flossing your teeth or using a toothpick because you may get an infection or bleed more easily. If you have any dental work done, tell your dentist you are receiving this medicine. Avoid taking products that contain aspirin, acetaminophen, ibuprofen, naproxen, or ketoprofen unless instructed by your doctor. These medicines may hide a fever. Do not become pregnant while taking this medicine. Women should inform their doctor if they wish to become pregnant or think they might be pregnant. There is a potential for serious side effects to an unborn child. Men should inform their doctors if they wish to father a child. This medicine may lower sperm counts. Talk to your health care professional or pharmacist for more information. Do not breast-feed an infant while taking this medicine. What side effects may I notice from receiving this medicine? Side effects that you should report to your doctor or health care professional as soon as possible: -allergic reactions like skin rash,   itching or hives, swelling of the face, lips, or tongue -low blood counts - this medicine may decrease the number of white blood cells, red blood cells and  platelets. You may be at increased risk for infections and bleeding. -signs of infection - fever or chills, cough, sore throat, pain or difficulty passing urine -signs of decreased platelets or bleeding - bruising, pinpoint red spots on the skin, black, tarry stools, blood in the urine -signs of decreased red blood cells - unusually weak or tired, fainting spells, lightheadedness -trouble passing urine or change in the amount of urine Side effects that usually do not require medical attention (report to your doctor or health care professional if they continue or are bothersome): -diarrhea This list may not describe all possible side effects. Call your doctor for medical advice about side effects. You may report side effects to FDA at 1-800-FDA-1088. Where should I keep my medicine? This drug is given in a hospital or clinic and will not be stored at home. NOTE: This sheet is a summary. It may not cover all possible information. If you have questions about this medicine, talk to your doctor, pharmacist, or health care provider.  2015, Elsevier/Gold Standard. (2011-11-26 14:15:47)  

## 2015-04-02 ENCOUNTER — Encounter: Payer: Self-pay | Admitting: Physician Assistant

## 2015-04-02 ENCOUNTER — Ambulatory Visit (INDEPENDENT_AMBULATORY_CARE_PROVIDER_SITE_OTHER): Payer: Medicare Other | Admitting: Physician Assistant

## 2015-04-02 VITALS — BP 114/68 | HR 88 | Temp 97.9°F | Resp 16 | Ht 63.5 in | Wt 123.0 lb

## 2015-04-02 DIAGNOSIS — Z0001 Encounter for general adult medical examination with abnormal findings: Secondary | ICD-10-CM | POA: Insufficient documentation

## 2015-04-02 DIAGNOSIS — R5383 Other fatigue: Secondary | ICD-10-CM

## 2015-04-02 DIAGNOSIS — J3489 Other specified disorders of nose and nasal sinuses: Secondary | ICD-10-CM

## 2015-04-02 DIAGNOSIS — R350 Frequency of micturition: Secondary | ICD-10-CM

## 2015-04-02 DIAGNOSIS — Z0000 Encounter for general adult medical examination without abnormal findings: Secondary | ICD-10-CM | POA: Insufficient documentation

## 2015-04-02 DIAGNOSIS — D519 Vitamin B12 deficiency anemia, unspecified: Secondary | ICD-10-CM

## 2015-04-02 MED ORDER — MUPIROCIN CALCIUM 2 % EX CREA: 1.0000 "application " | TOPICAL_CREAM | Freq: Three times a day (TID) | CUTANEOUS | Status: DC

## 2015-04-02 NOTE — Progress Notes (Signed)
Subjective:    Patient ID: Shannon Parsons, female    DOB: 1941-01-03, 74 y.o.   MRN: [id number]  HPI 74 y.o. female with history of HTN, GERD, preDM, diverticulosis, anxiety, chol and vitamin D being treated for Mantle cell lymphoma at cancer center with Ennever 2 chemos last Wednesday and 1 last thursday, complicated by DVT of her right leg on 01/04/2015 currently on Xarelto presents for UTI symptoms.  She complains of nocturia q hour, feels frequency, and her urine has an odor. She denies fever, chills, hematuria, AB pain, back pain.   She has small tender bump on her right nostril x 1 week, no getting better but not worse, has been using hot compresses and neosporin.   Blood pressure 114/68, pulse 88, temperature 97.9 F (36.6 C), resp. rate 16, height 5' 3.5" (1.613 m), weight 123 lb (55.792 kg), last menstrual period 09/14/2014.  Past Medical History  Diagnosis Date  . OSA (obstructive sleep apnea)   . Hiatal hernia   . IBS (irritable bowel syndrome)   . Fibrocystic breast disease   . Asthma   . Diverticulosis   . Adenomatous colon polyp 1994  . Internal hemorrhoids   . Melanoma     Facial  . Hyperlipidemia   . Hypertension   . GERD (gastroesophageal reflux disease)   . Osteoporosis   . Osteoarthritis   . Vitamin D deficiency   . Prediabetes   . Unspecified hypothyroidism   . Family history of ischemic heart disease   . Mantle cell lymphoma 10/18/2014   Current Outpatient Prescriptions on File Prior to Visit  Medication Sig Dispense Refill  . acetaminophen (TYLENOL) 325 MG tablet Take 325 mg by mouth every 6 (six) hours as needed (For pain.).     [name] beclomethasone (QVAR) 80 MCG/ACT inhaler Inhale 1 puff into the lungs 2 (two) times daily.     . Cholecalciferol (VITAMIN D) 2000 UNITS CAPS Take 2,000-4,000 capsules by mouth every other day. Patient alternates 1 cap with 2 caps every other day.    . hyoscyamine (LEVSIN, ANASPAZ) 0.125 MG tablet Take 1 to 2 tablets every 4  hours as needed for nausea, cramping, bloating or diarrhea 90 tablet 11  . levothyroxine (SYNTHROID, LEVOTHROID) 50 MCG tablet TAKE 1 TABLET BY MOUTH ONCE DAILY. 90 tablet 3  . lidocaine-prilocaine (EMLA) cream Apply 1 application topically as needed. Apply to [facility] one hour before access. 30 g 3  . LORazepam (ATIVAN) 0.5 MG tablet Take 1 tablet (0.5 mg total) by mouth every 6 (six) hours as needed (Nausea or vomiting). 30 tablet 0  . megestrol (MEGACE) 40 MG/ML suspension TAKE 10 MLS (400 MG TOTAL) BY MOUTH 2 TIMES DAILY. 480 mL 0  . PRESCRIPTION MEDICATION Patient receives her treatments at the [facility] with Dr Marin Olp. Her last dose of Bendeka 100mg  and Rituxan 600mg  was on 02/20/15.    . Probiotic Product (PROBIOTIC DAILY PO) Take 1 capsule by mouth daily.    . ranitidine (ZANTAC) 300 MG tablet Take 300 mg by mouth daily as needed for heartburn.     . rivaroxaban (XARELTO) 20 MG TABS tablet Take 1 tablet (20 mg total) by mouth daily with supper. 30 tablet 4   No current facility-administered medications on file prior to visit.     Review of Systems  Constitutional: Positive for fatigue. Negative for fever and chills.  HENT: Negative for congestion, sore throat and tinnitus.   Eyes: Negative.  Respiratory: Negative for cough, shortness of breath and wheezing.   Cardiovascular: Negative for chest pain, palpitations and leg swelling.  Gastrointestinal: Negative for blood in stool.  Genitourinary: Negative for hematuria.  Musculoskeletal: Positive for myalgias.  Skin: Negative.   Neurological: Negative for dizziness and headaches.  Psychiatric/Behavioral: The patient is not nervous/anxious.        Objective:   Physical Exam  Constitutional: She appears well-developed and well-nourished.  HENT:  Head: Normocephalic and atraumatic.  Nose: Mucosal edema and sinus tenderness present. No rhinorrhea, nose lacerations, septal deviation or nasal septal hematoma.  Right sinus exhibits no maxillary sinus tenderness and no frontal sinus tenderness. Left sinus exhibits no maxillary sinus tenderness and no frontal sinus tenderness.  Mouth/Throat: Uvula is midline, oropharynx is clear and moist and mucous membranes are normal.  Right nostril with erythema, tenderness. No ulcers.   Eyes: Conjunctivae are normal. Pupils are equal, round, and reactive to light.  Neck: Normal range of motion. Neck supple.  Cardiovascular: Normal rate and regular rhythm.   Pulmonary/Chest: Effort normal and breath sounds normal.  Abdominal: Soft. She exhibits no distension. There is no tenderness. There is no rebound.  Musculoskeletal: Normal range of motion.  Good strength bilateral legs, normal distal pulses. No CVA tenderness  Lymphadenopathy:    She has no cervical adenopathy.  Neurological: She is alert. She has normal strength and normal reflexes. She displays tremor (unchanged). She displays no atrophy. No cranial nerve deficit or sensory deficit. Gait normal.  Psychiatric: Her mood appears anxious.       Assessment & Plan:  Nasal infection- no ulcers/sign of shingles- will treat with bactroban to cover MRSA UTI- will check culture before starting on ABX Fatigue- likely due to chemo but will get B12 (has been def in the past), CBC, CMET, Iron,ferritin.

## 2015-04-02 NOTE — Patient Instructions (Signed)

## 2015-04-03 LAB — CBC WITH DIFFERENTIAL/PLATELET
BASOS ABS: 0 10*3/uL (ref 0.0–0.1)
BASOS PCT: 1 % (ref 0–1)
Eosinophils Absolute: 0.4 10*3/uL (ref 0.0–0.7)
Eosinophils Relative: 14 % — ABNORMAL HIGH (ref 0–5)
HEMATOCRIT: 37.4 % (ref 36.0–46.0)
Hemoglobin: 12.5 g/dL (ref 12.0–15.0)
LYMPHS PCT: 4 % — AB (ref 12–46)
Lymphs Abs: 0.1 10*3/uL — ABNORMAL LOW (ref 0.7–4.0)
MCH: 30 pg (ref 26.0–34.0)
MCHC: 33.4 g/dL (ref 30.0–36.0)
MCV: 89.9 fL (ref 78.0–100.0)
MONO ABS: 0.5 10*3/uL (ref 0.1–1.0)
MPV: 9.9 fL (ref 8.6–12.4)
Monocytes Relative: 16 % — ABNORMAL HIGH (ref 3–12)
Neutro Abs: 1.9 10*3/uL (ref 1.7–7.7)
Neutrophils Relative %: 65 % (ref 43–77)
Platelets: 195 10*3/uL (ref 150–400)
RBC: 4.16 MIL/uL (ref 3.87–5.11)
RDW: 15.2 % (ref 11.5–15.5)
WBC: 2.9 10*3/uL — ABNORMAL LOW (ref 4.0–10.5)

## 2015-04-03 LAB — HEPATIC FUNCTION PANEL
ALT: 11 U/L (ref 0–35)
AST: 19 U/L (ref 0–37)
Albumin: 3.5 g/dL (ref 3.5–5.2)
Alkaline Phosphatase: 48 U/L (ref 39–117)
Bilirubin, Direct: 0.1 mg/dL (ref 0.0–0.3)
Indirect Bilirubin: 0.2 mg/dL (ref 0.2–1.2)
Total Bilirubin: 0.3 mg/dL (ref 0.2–1.2)
Total Protein: 5.7 g/dL — ABNORMAL LOW (ref 6.0–8.3)

## 2015-04-03 LAB — IRON AND TIBC
%SAT: 21 % (ref 20–55)
Iron: 63 ug/dL (ref 42–145)
TIBC: 300 ug/dL (ref 250–470)
UIBC: 237 ug/dL (ref 125–400)

## 2015-04-03 LAB — URINALYSIS, ROUTINE W REFLEX MICROSCOPIC
BILIRUBIN URINE: NEGATIVE
Glucose, UA: NEGATIVE mg/dL
Hgb urine dipstick: NEGATIVE
KETONES UR: NEGATIVE mg/dL
LEUKOCYTES UA: NEGATIVE
Nitrite: NEGATIVE
Protein, ur: NEGATIVE mg/dL
Specific Gravity, Urine: 1.009 (ref 1.005–1.030)
UROBILINOGEN UA: 0.2 mg/dL (ref 0.0–1.0)
pH: 7 (ref 5.0–8.0)

## 2015-04-03 LAB — URINE CULTURE
Colony Count: NO GROWTH
Organism ID, Bacteria: NO GROWTH

## 2015-04-03 LAB — BASIC METABOLIC PANEL WITH GFR
BUN: 16 mg/dL (ref 6–23)
CO2: 22 mEq/L (ref 19–32)
Calcium: 8.9 mg/dL (ref 8.4–10.5)
Chloride: 102 mEq/L (ref 96–112)
Creat: 0.72 mg/dL (ref 0.50–1.10)
GFR, Est African American: >89 mL/min
GFR, Est Non African American: 85 mL/min
Glucose, Bld: 88 mg/dL (ref 70–99)
POTASSIUM: 3.7 meq/L (ref 3.5–5.3)
Sodium: 135 mEq/L (ref 135–145)

## 2015-04-03 LAB — FERRITIN: FERRITIN: 118 ng/mL (ref 10–291)

## 2015-04-03 LAB — VITAMIN B12: Vitamin B-12: 401 pg/mL (ref 211–911)

## 2015-04-03 LAB — TSH: TSH: 3.38 u[IU]/mL (ref 0.350–4.500)

## 2015-04-04 ENCOUNTER — Telehealth: Payer: Self-pay | Admitting: *Deleted

## 2015-04-04 NOTE — Telephone Encounter (Signed)
Patient called with c/o not feeling well. She states she went to her primary physician this week and was told she had a virus. She also stated that her physician reccommended that Dr Marin Olp reviewed the labs that were drawn. In addition to this, she started having some diarrhea and vomiting last evening and through the night.   Reviewed everything with Dr Marin Olp. He feels like her labs look good with no sign of concern. For the GI upset her recommends rest, increased fluid intake and immodium as needed.   Gave patient Dr Antonieta Pert instructions. Also instructed to call the on-call service or go to the ED over the weekend if symptoms worsen. She is in understanding.

## 2015-04-07 ENCOUNTER — Other Ambulatory Visit: Payer: Self-pay | Admitting: Physician Assistant

## 2015-04-07 MED ORDER — MIRABEGRON ER 50 MG PO TB24: 50.0000 mg | ORAL_TABLET | Freq: Every day | ORAL | Status: DC

## 2015-04-08 ENCOUNTER — Telehealth: Payer: Self-pay | Admitting: Internal Medicine

## 2015-04-08 ENCOUNTER — Telehealth: Payer: Self-pay | Admitting: *Deleted

## 2015-04-08 NOTE — Telephone Encounter (Signed)
Patient reviewed labs drawn last week. She is concerned about the white count being so low. Explained to patient that Dr Marin Olp had reviewed labs last week when she called, and that he was content with all the results. She was reassured that there wasn't a concern and was appreciative of the call back.

## 2015-04-08 NOTE — Telephone Encounter (Signed)
Patient called requesting samples of Xarelto 20mg , for 21months, she is in the doughnut hole and only needs this medicine for 2 months. Please advise patient  Thank you, Shannon Parsons Referral Coordinator  [facility] [phone] ext. [phone] Fax [phone]

## 2015-04-09 ENCOUNTER — Other Ambulatory Visit: Payer: Self-pay | Admitting: *Deleted

## 2015-04-09 DIAGNOSIS — N390 Urinary tract infection, site not specified: Secondary | ICD-10-CM

## 2015-04-09 MED ORDER — RIVAROXABAN 20 MG PO TABS: 20.0000 mg | ORAL_TABLET | Freq: Every day | ORAL | Status: DC

## 2015-04-09 MED ORDER — RIVAROXABAN 15 MG PO TABS: ORAL_TABLET | ORAL | Status: DC

## 2015-04-17 ENCOUNTER — Ambulatory Visit: Payer: Medicare Other

## 2015-04-17 ENCOUNTER — Ambulatory Visit: Payer: Medicare Other | Admitting: Hematology & Oncology

## 2015-04-17 ENCOUNTER — Other Ambulatory Visit: Payer: Medicare Other

## 2015-04-18 ENCOUNTER — Ambulatory Visit: Payer: Medicare Other

## 2015-04-23 ENCOUNTER — Other Ambulatory Visit: Payer: Self-pay | Admitting: Nurse Practitioner

## 2015-04-23 ENCOUNTER — Ambulatory Visit (HOSPITAL_BASED_OUTPATIENT_CLINIC_OR_DEPARTMENT_OTHER): Payer: Medicare Other | Admitting: Hematology & Oncology

## 2015-04-23 ENCOUNTER — Other Ambulatory Visit (HOSPITAL_BASED_OUTPATIENT_CLINIC_OR_DEPARTMENT_OTHER): Payer: Medicare Other

## 2015-04-23 ENCOUNTER — Encounter: Payer: Self-pay | Admitting: Hematology & Oncology

## 2015-04-23 ENCOUNTER — Telehealth: Payer: Self-pay | Admitting: *Deleted

## 2015-04-23 ENCOUNTER — Telehealth: Payer: Self-pay | Admitting: Hematology & Oncology

## 2015-04-23 ENCOUNTER — Ambulatory Visit (HOSPITAL_BASED_OUTPATIENT_CLINIC_OR_DEPARTMENT_OTHER): Payer: Medicare Other

## 2015-04-23 VITALS — BP 120/61 | HR 106 | Temp 97.2°F | Resp 16 | Ht 63.0 in | Wt 118.0 lb

## 2015-04-23 VITALS — BP 128/67 | HR 76 | Temp 98.0°F

## 2015-04-23 DIAGNOSIS — G4700 Insomnia, unspecified: Secondary | ICD-10-CM

## 2015-04-23 DIAGNOSIS — Z5111 Encounter for antineoplastic chemotherapy: Secondary | ICD-10-CM | POA: Diagnosis not present

## 2015-04-23 DIAGNOSIS — Z5112 Encounter for antineoplastic immunotherapy: Secondary | ICD-10-CM | POA: Diagnosis not present

## 2015-04-23 DIAGNOSIS — C8310 Mantle cell lymphoma, unspecified site: Secondary | ICD-10-CM

## 2015-04-23 DIAGNOSIS — F419 Anxiety disorder, unspecified: Secondary | ICD-10-CM | POA: Diagnosis not present

## 2015-04-23 DIAGNOSIS — R251 Tremor, unspecified: Secondary | ICD-10-CM

## 2015-04-23 LAB — CBC WITH DIFFERENTIAL (CANCER CENTER ONLY)
BASO#: 0 10*3/uL (ref 0.0–0.2)
BASO%: 0.7 % (ref 0.0–2.0)
EOS%: 24.5 % — ABNORMAL HIGH (ref 0.0–7.0)
Eosinophils Absolute: 0.4 10*3/uL (ref 0.0–0.5)
HCT: 38.6 % (ref 34.8–46.6)
HGB: 13.4 g/dL (ref 11.6–15.9)
LYMPH#: 0.3 10*3/uL — ABNORMAL LOW (ref 0.9–3.3)
LYMPH%: 18.4 % (ref 14.0–48.0)
MCH: 30.9 pg (ref 26.0–34.0)
MCHC: 34.7 g/dL (ref 32.0–36.0)
MCV: 89 fL (ref 81–101)
MONO#: 0.4 10*3/uL (ref 0.1–0.9)
MONO%: 23.8 % — AB (ref 0.0–13.0)
NEUT#: 0.5 10*3/uL — CL (ref 1.5–6.5)
NEUT%: 32.6 % — ABNORMAL LOW (ref 39.6–80.0)
PLATELETS: 99 10*3/uL — AB (ref 145–400)
RBC: 4.33 10*6/uL (ref 3.70–5.32)
RDW: 14.8 % (ref 11.1–15.7)
WBC: 1.5 10*3/uL — ABNORMAL LOW (ref 3.9–10.0)

## 2015-04-23 LAB — CMP (CANCER CENTER ONLY)
ALK PHOS: 48 U/L (ref 26–84)
ALT(SGPT): 25 U/L (ref 10–47)
AST: 29 U/L (ref 11–38)
Albumin: 3.5 g/dL (ref 3.3–5.5)
BILIRUBIN TOTAL: 0.7 mg/dL (ref 0.20–1.60)
BUN: 16 mg/dL (ref 7–22)
CO2: 24 mEq/L (ref 18–33)
Calcium: 9.9 mg/dL (ref 8.0–10.3)
Chloride: 107 mEq/L (ref 98–108)
Creat: 1 mg/dl (ref 0.6–1.2)
GLUCOSE: 92 mg/dL (ref 73–118)
Potassium: 3.4 mEq/L (ref 3.3–4.7)
Sodium: 137 mEq/L (ref 128–145)
Total Protein: 7.1 g/dL (ref 6.4–8.1)

## 2015-04-23 LAB — LACTATE DEHYDROGENASE: LDH: 189 U/L (ref 94–250)

## 2015-04-23 MED ORDER — SODIUM CHLORIDE 0.9 % IJ SOLN
10.0000 mL | INTRAMUSCULAR | Status: DC | PRN
  Administered 2015-04-23: 10 mL
  Filled 2015-04-23: qty 10

## 2015-04-23 MED ORDER — SODIUM CHLORIDE 0.9 % IV SOLN
90.0000 mg/m2 | Freq: Once | INTRAVENOUS | Status: AC
  Administered 2015-04-23: 150 mg via INTRAVENOUS
  Filled 2015-04-23: qty 6

## 2015-04-23 MED ORDER — HEPARIN SOD (PORK) LOCK FLUSH 100 UNIT/ML IV SOLN
500.0000 [IU] | Freq: Once | INTRAVENOUS | Status: AC | PRN
  Administered 2015-04-23: 500 [IU]
  Filled 2015-04-23: qty 5

## 2015-04-23 MED ORDER — ACETAMINOPHEN 325 MG PO TABS
ORAL_TABLET | ORAL | Status: AC
  Filled 2015-04-23: qty 2

## 2015-04-23 MED ORDER — DIPHENHYDRAMINE HCL 25 MG PO CAPS
ORAL_CAPSULE | ORAL | Status: AC
  Filled 2015-04-23: qty 2

## 2015-04-23 MED ORDER — SODIUM CHLORIDE 0.9 % IV SOLN
Freq: Once | INTRAVENOUS | Status: AC
  Administered 2015-04-23: 09:00:00 via INTRAVENOUS

## 2015-04-23 MED ORDER — ACETAMINOPHEN 325 MG PO TABS
650.0000 mg | ORAL_TABLET | Freq: Once | ORAL | Status: AC
  Administered 2015-04-23: 650 mg via ORAL

## 2015-04-23 MED ORDER — DIPHENHYDRAMINE HCL 25 MG PO CAPS
50.0000 mg | ORAL_CAPSULE | Freq: Once | ORAL | Status: AC
  Administered 2015-04-23: 50 mg via ORAL

## 2015-04-23 MED ORDER — TEMAZEPAM 15 MG PO CAPS: 15.0000 mg | ORAL_CAPSULE | Freq: Every evening | ORAL | Status: DC | PRN

## 2015-04-23 MED ORDER — SODIUM CHLORIDE 0.9 % IV SOLN
Freq: Once | INTRAVENOUS | Status: AC
  Administered 2015-04-23: 09:00:00 via INTRAVENOUS
  Filled 2015-04-23: qty 4

## 2015-04-23 MED ORDER — RITUXIMAB CHEMO INJECTION 500 MG/50ML
375.0000 mg/m2 | Freq: Once | INTRAVENOUS | Status: AC
  Administered 2015-04-23: 600 mg via INTRAVENOUS
  Filled 2015-04-23: qty 60

## 2015-04-23 NOTE — Patient Instructions (Signed)
[facility] Discharge Instructions for Patients Receiving Chemotherapy  Today you received the following chemotherapy agents Rituxan, Treanda  To help prevent nausea and vomiting after your treatment, we encourage you to take your nausea medication    If you develop nausea and vomiting that is not controlled by your nausea medication, call the clinic.   BELOW ARE SYMPTOMS THAT SHOULD BE REPORTED IMMEDIATELY:  *FEVER GREATER THAN 100.5 F  *CHILLS WITH OR WITHOUT FEVER  NAUSEA AND VOMITING THAT IS NOT CONTROLLED WITH YOUR NAUSEA MEDICATION  *UNUSUAL SHORTNESS OF BREATH  *UNUSUAL BRUISING OR BLEEDING  TENDERNESS IN MOUTH AND THROAT WITH OR WITHOUT PRESENCE OF ULCERS  *URINARY PROBLEMS  *BOWEL PROBLEMS  UNUSUAL RASH Items with * indicate a potential emergency and should be followed up as soon as possible.  Feel free to call the clinic you have any questions or concerns. The clinic phone number is [phone].  Please show the [place] at check-in to the Emergency Department and triage nurse.

## 2015-04-23 NOTE — Progress Notes (Signed)
Hematology and Oncology Follow Up Visit  Shannon Parsons [id number] [birth year] 74 y.o. 04/23/2015   Principle Diagnosis:   Mantle cell lymphoma (MCIPI = 5)  Thromboembolic event of the right leg  Current Therapy:    Patient s/p cycle #3 of Treanda/Rituxan  Xarelto 20 mg by mouth daily     Interim History:  [name] is back for follow-up.she seems be doing okay. She still has a lot of anxiety.  So far, she is tolerating her treatment quite well. She had no problems with her last treatment.  She is feeling tired. I'm unsure what she is eating. I'm not sure how much of her medications she is taking.  Her main problem seems to be insomnia. Again she has a ton of anxiety. I will go ahead and try her on some Restoril at 15 mg by mouth daily at bedtime to see if this helps.  She's not having any issues with diarrhea.  She's had no problems with bleeding. She is on Xarelto. I1 keep her on Xarelto for a total of 6 months. The lesser we did a Doppler of her right leg, there is no residual thrombus.  She's had no fever. She's had no cough or shortness of breath. Patient does have some tremors. I'm not sure what could be causing this. I think it probably is most likely just a benign essential hype of tremor  Overall, I would say her performance status is ECOG 1-2. i  Medications:  Current outpatient prescriptions:  .  acetaminophen (TYLENOL) 325 MG tablet, Take 325 mg by mouth every 6 (six) hours as needed (For pain.). , Disp: , Rfl:  .  beclomethasone (QVAR) 80 MCG/ACT inhaler, Inhale 1 puff into the lungs 2 (two) times daily. , Disp: , Rfl:  .  Cholecalciferol (VITAMIN D) 2000 UNITS CAPS, Take 2,000-4,000 capsules by mouth every other day. Patient alternates 1 cap with 2 caps every other day., Disp: , Rfl:  .  hyoscyamine (LEVSIN, ANASPAZ) 0.125 MG tablet, Take 1 to 2 tablets every 4 hours as needed for nausea, cramping, bloating or diarrhea, Disp: 90 tablet, Rfl: 11 .   levothyroxine (SYNTHROID, LEVOTHROID) 50 MCG tablet, TAKE 1 TABLET BY MOUTH ONCE DAILY., Disp: 90 tablet, Rfl: 3 .  lidocaine-prilocaine (EMLA) cream, Apply 1 application topically as needed. Apply to [facility] one hour before access., Disp: 30 g, Rfl: 3 .  LORazepam (ATIVAN) 0.5 MG tablet, Take 1 tablet (0.5 mg total) by mouth every 6 (six) hours as needed (Nausea or vomiting)., Disp: 30 tablet, Rfl: 0 .  megestrol (MEGACE) 40 MG/ML suspension, TAKE 10 MLS (400 MG TOTAL) BY MOUTH 2 TIMES DAILY., Disp: 480 mL, Rfl: 0 .  mirabegron ER (MYRBETRIQ) 50 MG TB24 tablet, Take 1 tablet (50 mg total) by mouth daily., Disp: 30 tablet, Rfl: 5 .  mupirocin cream (BACTROBAN) 2 %, Apply 1 application topically 3 (three) times daily. For 7-14 days, Disp: 30 g, Rfl: 2 .  PRESCRIPTION MEDICATION, Patient receives her treatments at the [facility] in [facility] with Dr Marin Olp. Her last dose of Bendeka 18m and Rituxan 6061mwas on 02/20/15., Disp: , Rfl:  .  Probiotic Product (PROBIOTIC DAILY PO), Take 1 capsule by mouth daily., Disp: , Rfl:  .  ranitidine (ZANTAC) 300 MG tablet, Take 300 mg by mouth daily as needed for heartburn. , Disp: , Rfl:  .  rivaroxaban (XARELTO) 20 MG TABS tablet, Take 1 tablet (20 mg total) by mouth daily  with supper., Disp: 15 tablet, Rfl: 0 .  temazepam (RESTORIL) 15 MG capsule, Take 1 capsule (15 mg total) by mouth at bedtime as needed for sleep., Disp: 30 capsule, Rfl: 2 No current facility-administered medications for this visit.  Facility-Administered Medications Ordered in Other Visits:  .  bendamustine (BENDEKA) 150 mg in sodium chloride 0.9 % 50 mL (2.6786 mg/mL) chemo infusion, 90 mg/m2 (Treatment Plan Actual), Intravenous, Once, Volanda Napoleon, MD .  heparin lock flush 100 unit/mL, 500 Units, Intracatheter, Once PRN, Volanda Napoleon, MD .  ondansetron (ZOFRAN) 8 mg, dexamethasone (DECADRON) 10 mg in sodium chloride 0.9 % 50 mL IVPB, , Intravenous, Once, Volanda Napoleon,  MD .  riTUXimab (RITUXAN) 600 mg in sodium chloride 0.9 % 190 mL chemo infusion, 375 mg/m2 (Treatment Plan Actual), Intravenous, Once, Volanda Napoleon, MD .  sodium chloride 0.9 % injection 10 mL, 10 mL, Intracatheter, PRN, Volanda Napoleon, MD  Allergies:  Allergies  Allergen Reactions  . Actonel [Risedronate Sodium] Other (See Comments)    "made me choke"   . Fosamax [Alendronate] Other (See Comments)    "made me choke"  . Sulfa Antibiotics Other (See Comments)    Doesn't remember   . Tetracyclines & Related Rash    Past Medical History, Surgical history, Social history, and Family History were reviewed and updated.  Review of Systems: As above  Physical Exam:  height is 5' 3"  (1.6 m) and weight is 118 lb (53.524 kg). Her oral temperature is 97.2 F (36.2 C). Her blood pressure is 120/61 and her pulse is 106. Her respiration is 16.   Well-developed and well-nourished white female. Head and neck exam shows no ocular or oral lesions. She has no bilateral cervical or supraclavicular lymph nodes. She has no palpable thyroid. Lungs are clear. Cardiac exam regular rate and rhythm with no murmurs, rubs or bruits. Axillary exam shows no obvious bilateral axillary adenopathy. Abdomen is soft. She has decent bowel sounds. There is no fluid wave. Her spleen tip is just below the left costal margin. There is no hepatomegaly. Back exam shows no tenderness over the spine, ribs or hips. Extremities shows no clubbing, cyanosis or edema. No palpable venous cord is noted in the right leg. She has good range of motion of her joints. She has good strength in her muscles. Neurological exam is nonfocal. Skin exam shows no rashes, ecchymoses or petechia  Lab Results  Component Value Date   WBC 1.5* 04/23/2015   HGB 13.4 04/23/2015   HCT 38.6 04/23/2015   MCV 89 04/23/2015   PLT 99* 04/23/2015     Chemistry      Component Value Date/Time   NA 137 04/23/2015 0803   NA 135 04/02/2015 1511   K 3.4  04/23/2015 0803   K 3.7 04/02/2015 1511   CL 107 04/23/2015 0803   CL 102 04/02/2015 1511   CO2 24 04/23/2015 0803   CO2 22 04/02/2015 1511   BUN 16 04/23/2015 0803   BUN 16 04/02/2015 1511   CREATININE 1.0 04/23/2015 0803   CREATININE 0.64 02/22/2015 0405      Component Value Date/Time   CALCIUM 9.9 04/23/2015 0803   CALCIUM 8.9 04/02/2015 1511   ALKPHOS 48 04/23/2015 0803   ALKPHOS 48 04/02/2015 1511   AST 29 04/23/2015 0803   AST 19 04/02/2015 1511   ALT 25 04/23/2015 0803   ALT 11 04/02/2015 1511   BILITOT 0.70 04/23/2015 0803   BILITOT 0.3 04/02/2015 1511  Impression and Plan: Ms. Earnhart is 74 year old white female. She has mantle cell lymphoma. Again, by the [facility] she is an intermediate risk.  She has responded very well. Her last PET scan looks excellent. There is that slight activity in the lung which I would suspect would be inflammatory in nature.  I will plan for her fourth and final cycle of Treanda/Rituxan today. Even though her white cell count is low, she has a high percentage of monocytes so this typically indicates marrow recovery. However, I will make sure that we give her Neulasta on Friday so that her white cell count is roosting up.  I told her that we will plan for a follow-up bone marrow biopsy in about 6 weeks. We will also do a PET scan in about 6 weeks.  I think that she might be a good candidate for maintenance Rituxan. I will have to think about this.  I spent about 35 minutes with she and her husband this morning.  Volanda Napoleon, MD 8/10/20169:10 AM

## 2015-04-23 NOTE — Telephone Encounter (Addendum)
[id number] pegfilgrastim (NEULASTA) injection 6 mg APPROVED Auth: [id number] Valid: 04/23/2015 -  09/13/2015                  Clinical Nurse Mateo Flow: [phone] P: [phone] F: [phone] Mantle Cell Lymphoma  - Primary C83.10

## 2015-04-23 NOTE — Telephone Encounter (Signed)
Critical Value Neutrophils 0.5 Dr Ennever aware. No orders at this time 

## 2015-04-24 ENCOUNTER — Ambulatory Visit (HOSPITAL_BASED_OUTPATIENT_CLINIC_OR_DEPARTMENT_OTHER): Payer: Medicare Other

## 2015-04-24 VITALS — BP 122/75 | HR 74 | Temp 98.4°F | Resp 18

## 2015-04-24 DIAGNOSIS — Z5111 Encounter for antineoplastic chemotherapy: Secondary | ICD-10-CM | POA: Diagnosis not present

## 2015-04-24 DIAGNOSIS — C8310 Mantle cell lymphoma, unspecified site: Secondary | ICD-10-CM | POA: Diagnosis not present

## 2015-04-24 MED ORDER — SODIUM CHLORIDE 0.9 % IJ SOLN
10.0000 mL | INTRAMUSCULAR | Status: DC | PRN
  Administered 2015-04-24: 10 mL
  Filled 2015-04-24: qty 10

## 2015-04-24 MED ORDER — SODIUM CHLORIDE 0.9 % IV SOLN
Freq: Once | INTRAVENOUS | Status: AC
  Administered 2015-04-24: 12:00:00 via INTRAVENOUS
  Filled 2015-04-24: qty 4

## 2015-04-24 MED ORDER — HEPARIN SOD (PORK) LOCK FLUSH 100 UNIT/ML IV SOLN
500.0000 [IU] | Freq: Once | INTRAVENOUS | Status: AC | PRN
  Administered 2015-04-24: 500 [IU]
  Filled 2015-04-24: qty 5

## 2015-04-24 MED ORDER — SODIUM CHLORIDE 0.9 % IV SOLN
Freq: Once | INTRAVENOUS | Status: AC
  Administered 2015-04-24: 12:00:00 via INTRAVENOUS

## 2015-04-24 MED ORDER — SODIUM CHLORIDE 0.9 % IV SOLN
90.0000 mg/m2 | Freq: Once | INTRAVENOUS | Status: AC
  Administered 2015-04-24: 150 mg via INTRAVENOUS
  Filled 2015-04-24: qty 6

## 2015-04-24 NOTE — Patient Instructions (Signed)
[facility] Discharge Instructions for Patients Receiving Chemotherapy  Today you received the following chemotherapy agents Bendeka  To help prevent nausea and vomiting after your treatment, we encourage you to take your nausea medication as prescribed. If you develop nausea and vomiting that is not controlled by your nausea medication, call the clinic.   BELOW ARE SYMPTOMS THAT SHOULD BE REPORTED IMMEDIATELY:  *FEVER GREATER THAN 100.5 F  *CHILLS WITH OR WITHOUT FEVER  NAUSEA AND VOMITING THAT IS NOT CONTROLLED WITH YOUR NAUSEA MEDICATION  *UNUSUAL SHORTNESS OF BREATH  *UNUSUAL BRUISING OR BLEEDING  TENDERNESS IN MOUTH AND THROAT WITH OR WITHOUT PRESENCE OF ULCERS  *URINARY PROBLEMS  *BOWEL PROBLEMS  UNUSUAL RASH Items with * indicate a potential emergency and should be followed up as soon as possible.  Feel free to call the clinic you have any questions or concerns. The clinic phone number is [phone].  Please show the CHEMO ALERT CARD at check-in to the Emergency Department and triage nurse.   

## 2015-04-25 ENCOUNTER — Ambulatory Visit (HOSPITAL_BASED_OUTPATIENT_CLINIC_OR_DEPARTMENT_OTHER): Payer: Medicare Other

## 2015-04-25 DIAGNOSIS — C8310 Mantle cell lymphoma, unspecified site: Secondary | ICD-10-CM

## 2015-04-25 DIAGNOSIS — Z5189 Encounter for other specified aftercare: Secondary | ICD-10-CM | POA: Diagnosis not present

## 2015-04-25 MED ORDER — PEGFILGRASTIM INJECTION 6 MG/0.6ML
6.0000 mg | Freq: Once | SUBCUTANEOUS | Status: AC
  Administered 2015-04-25: 6 mg via SUBCUTANEOUS
  Filled 2015-04-25: qty 0.6

## 2015-04-25 NOTE — Patient Instructions (Signed)
Pegfilgrastim injection What is this medicine? PEGFILGRASTIM (peg fil GRA stim) is a long-acting granulocyte colony-stimulating factor that stimulates the growth of neutrophils, a type of white blood cell important in the body's fight against infection. It is used to reduce the incidence of fever and infection in patients with certain types of cancer who are receiving chemotherapy that affects the bone marrow. This medicine may be used for other purposes; ask your health care provider or pharmacist if you have questions. COMMON BRAND NAME(S): Neulasta What should I tell my health care provider before I take this medicine? They need to know if you have any of these conditions: -latex allergy -ongoing radiation therapy -sickle cell disease -skin reactions to acrylic adhesives (On-Body Injector only) -an unusual or allergic reaction to pegfilgrastim, filgrastim, other medicines, foods, dyes, or preservatives -pregnant or trying to get pregnant -breast-feeding How should I use this medicine? This medicine is for injection under the skin. If you get this medicine at home, you will be taught how to prepare and give the pre-filled syringe or how to use the On-body Injector. Refer to the patient Instructions for Use for detailed instructions. Use exactly as directed. Take your medicine at regular intervals. Do not take your medicine more often than directed. It is important that you put your used needles and syringes in a special sharps container. Do not put them in a trash can. If you do not have a sharps container, call your pharmacist or healthcare provider to get one. Talk to your pediatrician regarding the use of this medicine in children. Special care may be needed. Overdosage: If you think you have taken too much of this medicine contact a poison control center or emergency room at once. NOTE: This medicine is only for you. Do not share this medicine with others. What if I miss a dose? It is  important not to miss your dose. Call your doctor or health care professional if you miss your dose. If you miss a dose due to an On-body Injector failure or leakage, a new dose should be administered as soon as possible using a single prefilled syringe for manual use. What may interact with this medicine? Interactions have not been studied. Give your health care provider a list of all the medicines, herbs, non-prescription drugs, or dietary supplements you use. Also tell them if you smoke, drink alcohol, or use illegal drugs. Some items may interact with your medicine. This list may not describe all possible interactions. Give your health care provider a list of all the medicines, herbs, non-prescription drugs, or dietary supplements you use. Also tell them if you smoke, drink alcohol, or use illegal drugs. Some items may interact with your medicine. What should I watch for while using this medicine? You may need blood work done while you are taking this medicine. If you are going to need a MRI, CT scan, or other procedure, tell your doctor that you are using this medicine (On-Body Injector only). What side effects may I notice from receiving this medicine? Side effects that you should report to your doctor or health care professional as soon as possible: -allergic reactions like skin rash, itching or hives, swelling of the face, lips, or tongue -dizziness -fever -pain, redness, or irritation at site where injected -pinpoint red spots on the skin -shortness of breath or breathing problems -stomach or side pain, or pain at the shoulder -swelling -tiredness -trouble passing urine Side effects that usually do not require medical attention (report to your doctor   or health care professional if they continue or are bothersome): -bone pain -muscle pain This list may not describe all possible side effects. Call your doctor for medical advice about side effects. You may report side effects to FDA at  1-800-FDA-1088. Where should I keep my medicine? Keep out of the reach of children. Store pre-filled syringes in a refrigerator between 2 and 8 degrees C (36 and 46 degrees F). Do not freeze. Keep in carton to protect from light. Throw away this medicine if it is left out of the refrigerator for more than 48 hours. Throw away any unused medicine after the expiration date. NOTE: This sheet is a summary. It may not cover all possible information. If you have questions about this medicine, talk to your doctor, pharmacist, or health care provider.  2015, Elsevier/Gold Standard. (2013-11-29 16:14:05)  

## 2015-05-01 NOTE — Telephone Encounter (Signed)
APPROVED [id number]

## 2015-05-14 ENCOUNTER — Ambulatory Visit (INDEPENDENT_AMBULATORY_CARE_PROVIDER_SITE_OTHER): Payer: Medicare Other | Admitting: Physician Assistant

## 2015-05-14 ENCOUNTER — Encounter: Payer: Self-pay | Admitting: Physician Assistant

## 2015-05-14 VITALS — BP 110/72 | HR 80 | Temp 97.5°F | Resp 16 | Ht 63.0 in | Wt 121.2 lb

## 2015-05-14 DIAGNOSIS — I82401 Acute embolism and thrombosis of unspecified deep veins of right lower extremity: Secondary | ICD-10-CM | POA: Diagnosis not present

## 2015-05-14 DIAGNOSIS — N952 Postmenopausal atrophic vaginitis: Secondary | ICD-10-CM

## 2015-05-14 DIAGNOSIS — R350 Frequency of micturition: Secondary | ICD-10-CM

## 2015-05-14 DIAGNOSIS — N760 Acute vaginitis: Secondary | ICD-10-CM | POA: Diagnosis not present

## 2015-05-14 MED ORDER — TERCONAZOLE 0.4 % VA CREA
1.0000 | TOPICAL_CREAM | Freq: Every day | VAGINAL | Status: DC
Start: 2015-05-14 — End: 2015-06-24

## 2015-05-14 MED ORDER — TRIAMCINOLONE ACETONIDE 0.1 % EX CREA: 1.0000 "application " | TOPICAL_CREAM | Freq: Two times a day (BID) | CUTANEOUS | Status: DC

## 2015-05-14 NOTE — Patient Instructions (Signed)
Vaginitis Vaginitis is an inflammation of the vagina. It can happen when the normal bacteria and yeast in the vagina grow too much. There are different types. Treatment will depend on the type you have. HOME CARE  Take all medicines as told by your doctor.  Keep your vagina area clean and dry. Avoid soap. Rinse the area with water.  Avoid washing and cleaning out the vagina (douching).  Do not use tampons or have sex (intercourse) until your treatment is done.  Wipe from front to back after going to the restroom.  Wear cotton underwear.  Avoid wearing underwear while you sleep until your vaginitis is gone.  Avoid tight pants. Avoid underwear or nylons without a cotton panel.  Take off wet clothing (such as a bathing suit) as soon as you can.  Use mild, unscented products. Avoid fabric softeners and scented:  Feminine sprays.  Laundry detergents.  Tampons.  Soaps or bubble baths.  Practice safe sex and use condoms. GET HELP RIGHT AWAY IF:   You have belly (abdominal) pain.  You have a fever or lasting symptoms for more than 2-3 days.  You have a fever and your symptoms suddenly get worse. MAKE SURE YOU:   Understand these instructions.  Will watch this condition.  Will get help right away if you are not doing well or get worse. Document Released: 11/26/2008 Document Revised: 05/24/2012 Document Reviewed: 02/10/2012 [facility] Patient Information 2015 [place], [place]. This information is not intended to replace advice given to you by your health care provider. Make sure you discuss any questions you have with your health care provider.   VAGINAL DRYNESS OVERVIEW  Vaginal dryness, also known as atrophic vaginitis, is a common condition in postmenopausal women. This condition is also common in women who have had both ovaries removed at the time of hysterectomy.   Some women have uncomfortable symptoms of vaginal dryness, such as pain with sex, burning vaginal  discomfort or itching, or abnormal vaginal discharge, while others have no symptoms at all.  VAGINAL DRYNESS CAUSES   Estrogen helps to keep the vagina moist and to maintain thickness of the vaginal lining. Vaginal dryness occurs when the ovaries produce a decreased amount of estrogen. This can occur at certain times in a woman's life, and may be permanent or temporary. Times when less estrogen is made include: ?At the time of menopause. ?After surgical removal of the ovaries, chemotherapy, or radiation therapy of the pelvis for cancer. ?After having a baby, particularly in women who breastfeed. ?While using certain medications, such as danazol, medroxyprogesterone (brand names: Provera or DepoProvera), leuprolide (brand name: Lupron), or nafarelin. When these medications are stopped, estrogen production resumes.  Women who smoke cigarettes have been shown to have an increased risk of an earlier menopause transition as compared to non-smokers. Therefore, atrophic vaginitis symptoms may appear at a younger age in this population.  VAGINAL DRYNESS TREATMENT   There are three treatment options for women with vaginal dryness:  Vaginal lubricants and moisturizers - Vaginal lubricants and moisturizers can be purchased without a prescription. These products do not contain any hormones and have virtually no side effects. - Albolene is found in the facial cleanser section at [facility], [facility], or [employer]. It is a large jar with a blue top. This is the best lubricant for women because it is hypoallergenic. -Natural lubricants, such as olive, avocado or peanut oil, are easily available products that may be used as a lubricant with sex.  -Vaginal moisturizes (eg, Replens, Moist  Again, Vagisil, K-Y Silk-E, and Feminease) are formulated to allow water to be retained in the vaginal tissues. Moisturizers are applied into the vagina three times weekly to allow a continued moisturizing effect. These should not be used  just before having sex, as they can be irritating.  Vaginal estrogen - Vaginal estrogen is the most effective treatment option for women with vaginal dryness. Vaginal estrogen must be prescribed by a healthcare provider. Very low doses of vaginal estrogen can be used when it is put into the vagina to treat vaginal dryness. A small amount of estrogen is absorbed into the bloodstream, but only about 100 times less than when using estrogen pills or tablets. As a result, there is a much lower risk of side effects, such as blood clots, breast cancer, and heart attack, compared with other estrogen-containing products (birth control pills, menopausal hormone therapy).   Ospemifene - Ospemifene is a prescription medication that is similar to estrogen, but is not estrogen. In the vaginal tissue, it acts similarly to estrogen. In the breast tissue, it acts as an estrogen blocker. It comes in a pill, and is prescribed for women who want to use an estrogen-like medication for vaginal dryness or painful sex associated with vaginal dryness, but prefer not to use a vaginal medication. The medication may cause hot flashes as a side effect. This type of medication may increase the risk of blood clots or uterine cancer. Further study of ospemifene is needed to evaluate the risk of these complications. This medication has not been tested in women who have had breast cancer or are at a high risk of developing breast cancer.    Sexual activity - Vaginal estrogen improves vaginal dryness quickly, usually within a few weeks. You may continue to have sex as you treat vaginal dryness because sex itself can help to keep the vaginal tissues healthy. Vaginal intercourse may help the vaginal tissues by keeping them soft and stretchable and preventing the tissues from shrinking.  If sex continues to be painful despite treatment for vaginal dryness, talk to your healthcare provider.

## 2015-05-14 NOTE — Progress Notes (Signed)
Subjective:    Patient ID: Shannon Parsons, female    DOB: [birth year], 74 y.o.   MRN: [id number]  HPI 74 y.o. WF with history of HTN, GERD, preDM, diverticulosis, anxiety, chol and vitamin D being treated for Mantle cell lymphoma at cancer center with Ennever, complicated by DVT of her right leg on 01/04/2015 currently on Xarelto presents for hematuria. On Sunday, Monday, Tuesday she would noticed bright red blood on her tissue paper after wiping, then last night she soaked in the tub and it has not had any since then. She has frequency and has had some lower AB discomfort with bloating, denies small amounts, hesitancy, dysuria, falling out sensation, pressure, constipation, diarrhea.   CT AB with and without: 09/20/2014 IMPRESSION: 1. Considerable retroperitoneal, porta hepatis, peripancreatic, gastrohepatic ligament, and mesenteric adenopathy tracking down into the pelvis. Mild splenomegaly. Appearance favors lymphoma although otherwise occult malignancy could also cause extensive adenopathy. Consider nuclear medicine PET-CT or biopsy. 2. Mild right hydronephrosis suspicious for partial UPJ obstruction. 3. Ancillary findings include small type 1 hiatal hernia, degenerative disc disease, and chronic pars defects at L5 with grade 1 anterolisthesis.  US PELVIS transvaginal IMPRESSION: Unremarkable pelvic ultrasound. Right ovary not visualized, but no adnexal mass seen.  Blood pressure 110/72, pulse 80, temperature 97.5 F (36.4 C), resp. rate 16, height 5\' 3"  (1.6 m), weight 121 lb 3.2 oz (54.976 kg), last menstrual period 09/14/2014.  Current Outpatient Prescriptions on File Prior to Visit  Medication Sig Dispense Refill  . acetaminophen (TYLENOL) 325 MG tablet Take 325 mg by mouth every 6 (six) hours as needed (For pain.).     [name] beclomethasone (QVAR) 80 MCG/ACT inhaler Inhale 1 puff into the lungs 2 (two) times daily.     . Cholecalciferol (VITAMIN D) 2000 UNITS CAPS Take  2,000-4,000 capsules by mouth every other day. Patient alternates 1 cap with 2 caps every other day.    . hyoscyamine (LEVSIN, ANASPAZ) 0.125 MG tablet Take 1 to 2 tablets every 4 hours as needed for nausea, cramping, bloating or diarrhea 90 tablet 11  . levothyroxine (SYNTHROID, LEVOTHROID) 50 MCG tablet TAKE 1 TABLET BY MOUTH ONCE DAILY. 90 tablet 3  . lidocaine-prilocaine (EMLA) cream Apply 1 application topically as needed. Apply to [facility] one hour before access. 30 g 3  . LORazepam (ATIVAN) 0.5 MG tablet Take 1 tablet (0.5 mg total) by mouth every 6 (six) hours as needed (Nausea or vomiting). 30 tablet 0  . megestrol (MEGACE) 40 MG/ML suspension TAKE 10 MLS (400 MG TOTAL) BY MOUTH 2 TIMES DAILY. 480 mL 0  . mirabegron ER (MYRBETRIQ) 50 MG TB24 tablet Take 1 tablet (50 mg total) by mouth daily. 30 tablet 5  . mupirocin cream (BACTROBAN) 2 % Apply 1 application topically 3 (three) times daily. For 7-14 days 30 g 2  . PRESCRIPTION MEDICATION Patient receives her treatments at the [facility] in [facility] with Dr Marin Olp. Her last dose of Bendeka 100mg  and Rituxan 600mg  was on 02/20/15.    . Probiotic Product (PROBIOTIC DAILY PO) Take 1 capsule by mouth daily.    . ranitidine (ZANTAC) 300 MG tablet Take 300 mg by mouth daily as needed for heartburn.     . rivaroxaban (XARELTO) 20 MG TABS tablet Take 1 tablet (20 mg total) by mouth daily with supper. 15 tablet 0  . temazepam (RESTORIL) 15 MG capsule Take 1 capsule (15 mg total) by mouth at bedtime as needed for sleep. 30 capsule 2  No current facility-administered medications on file prior to visit.    Past Medical History  Diagnosis Date  . OSA (obstructive sleep apnea)   . Hiatal hernia   . IBS (irritable bowel syndrome)   . Fibrocystic breast disease   . Asthma   . Diverticulosis   . Adenomatous colon polyp 1994  . Internal hemorrhoids   . Melanoma     Facial  . Hyperlipidemia   . Hypertension   . GERD (gastroesophageal  reflux disease)   . Osteoporosis   . Osteoarthritis   . Vitamin D deficiency   . Prediabetes   . Unspecified hypothyroidism   . Family history of ischemic heart disease   . Mantle cell lymphoma 10/18/2014    Review of Systems  Constitutional: Positive for fatigue. Negative for fever and chills.  HENT: Negative for congestion, sore throat and tinnitus.   Eyes: Negative.   Respiratory: Negative for cough, shortness of breath and wheezing.   Cardiovascular: Negative for chest pain, palpitations and leg swelling.  Gastrointestinal: Negative for blood in stool.  Genitourinary: Positive for frequency, vaginal bleeding and vaginal pain. Negative for dysuria, urgency, hematuria, flank pain, decreased urine volume, vaginal discharge, difficulty urinating, menstrual problem, pelvic pain and dyspareunia.  Musculoskeletal: Positive for myalgias.  Skin: Negative.   Neurological: Negative for dizziness and headaches.  Psychiatric/Behavioral: The patient is not nervous/anxious.        Objective:   Physical Exam  Constitutional: She is oriented to person, place, and time. She appears well-developed and well-nourished.  HENT:  Head: Normocephalic and atraumatic.  Eyes: Conjunctivae are normal. Pupils are equal, round, and reactive to light.  Neck: Normal range of motion. Neck supple.  Pulmonary/Chest: Effort normal and breath sounds normal.  Abdominal: Soft. Bowel sounds are normal.  Genitourinary: There is erythema and tenderness in the vagina. No bleeding in the vagina. No foreign body around the vagina. There are signs of injury in the vagina. No vaginal discharge found.  Neurological: She is alert and oriented to person, place, and time.  Skin: Skin is warm and dry.      Assessment & Plan:  1. Urine frequency Rule out infection - Urine culture - Urinalysis, Routine w reflex microscopic (not at [facility])  2. Vaginal atrophy/ Vaginitis and vulvovaginitis Information given to patient.  -  terconazole (TERAZOL 7) 0.4 % vaginal cream; Place 1 applicator vaginally at bedtime. For 3-5 days  Dispense: 45 g; Refill: 2 - triamcinolone cream (KENALOG) 0.1 %; Apply 1 application topically 2 (two) times daily. For 2-3 days  Dispense: 45 g; Refill: 1  3. Leg DVT (deep venous thromboembolism), acute, right Discussed with cancer, increase risk of blood clot, continue xarelto for now

## 2015-05-15 LAB — URINALYSIS, ROUTINE W REFLEX MICROSCOPIC
BILIRUBIN URINE: NEGATIVE
Glucose, UA: NEGATIVE
HGB URINE DIPSTICK: NEGATIVE
Ketones, ur: NEGATIVE
Leukocytes, UA: NEGATIVE
Nitrite: NEGATIVE
PROTEIN: NEGATIVE
Specific Gravity, Urine: 1.006 (ref 1.001–1.035)
pH: 7 (ref 5.0–8.0)

## 2015-05-15 LAB — URINE CULTURE
COLONY COUNT: NO GROWTH
ORGANISM ID, BACTERIA: NO GROWTH

## 2015-06-04 ENCOUNTER — Other Ambulatory Visit: Payer: Self-pay | Admitting: Hematology & Oncology

## 2015-06-04 ENCOUNTER — Telehealth: Payer: Self-pay | Admitting: Hematology & Oncology

## 2015-06-04 NOTE — Telephone Encounter (Signed)
Per Md order to sch patient for 06/24/15 lab and md follow up and to call patient.  appt was scheduled and i called and reminded patient of 06/17/15 bone marrow apt and gave her apt date/time for 06/24/15.  Patient is aware of both apts

## 2015-06-05 ENCOUNTER — Ambulatory Visit: Payer: Medicare Other | Admitting: Cardiology

## 2015-06-10 ENCOUNTER — Ambulatory Visit (HOSPITAL_COMMUNITY): Admission: RE | Admit: 2015-06-10 | Payer: Medicare Other | Source: Ambulatory Visit

## 2015-06-11 ENCOUNTER — Ambulatory Visit (HOSPITAL_COMMUNITY)
Admission: RE | Admit: 2015-06-11 | Discharge: 2015-06-11 | Disposition: A | Discharge status: Home / Self Care | Payer: Medicare Other | Source: Ambulatory Visit | Attending: Hematology & Oncology | Admitting: Hematology & Oncology

## 2015-06-11 DIAGNOSIS — R918 Other nonspecific abnormal finding of lung field: Secondary | ICD-10-CM | POA: Diagnosis not present

## 2015-06-11 DIAGNOSIS — I2510 Atherosclerotic heart disease of native coronary artery without angina pectoris: Secondary | ICD-10-CM | POA: Diagnosis not present

## 2015-06-11 DIAGNOSIS — G4700 Insomnia, unspecified: Secondary | ICD-10-CM

## 2015-06-11 DIAGNOSIS — C8310 Mantle cell lymphoma, unspecified site: Secondary | ICD-10-CM | POA: Diagnosis present

## 2015-06-11 LAB — GLUCOSE, CAPILLARY: GLUCOSE-CAPILLARY: 83 mg/dL (ref 65–99)

## 2015-06-11 MED ORDER — FLUDEOXYGLUCOSE F - 18 (FDG) INJECTION
6.0800 | Freq: Once | INTRAVENOUS | Status: DC | PRN
  Administered 2015-06-11: 6.08 via INTRAVENOUS
  Filled 2015-06-11: qty 6.08

## 2015-06-13 ENCOUNTER — Other Ambulatory Visit: Payer: Self-pay | Admitting: Hematology & Oncology

## 2015-06-13 DIAGNOSIS — C8310 Mantle cell lymphoma, unspecified site: Secondary | ICD-10-CM

## 2015-06-17 ENCOUNTER — Encounter (HOSPITAL_COMMUNITY): Payer: Self-pay

## 2015-06-17 ENCOUNTER — Other Ambulatory Visit: Payer: Medicare Other

## 2015-06-17 ENCOUNTER — Ambulatory Visit (HOSPITAL_COMMUNITY)
Admission: RE | Admit: 2015-06-17 | Discharge: 2015-06-17 | Disposition: A | Discharge status: Home / Self Care | Payer: Medicare Other | Source: Ambulatory Visit | Attending: Hematology & Oncology | Admitting: Hematology & Oncology

## 2015-06-17 ENCOUNTER — Ambulatory Visit: Payer: Medicare Other | Admitting: Hematology & Oncology

## 2015-06-17 VITALS — BP 108/51 | HR 72 | Temp 98.0°F | Resp 20 | Ht 64.0 in | Wt 120.0 lb

## 2015-06-17 DIAGNOSIS — C8310 Mantle cell lymphoma, unspecified site: Secondary | ICD-10-CM

## 2015-06-17 LAB — CBC WITH DIFFERENTIAL/PLATELET
BASOS ABS: 0 10*3/uL (ref 0.0–0.1)
Basophils Relative: 1 %
EOS ABS: 0.3 10*3/uL (ref 0.0–0.7)
Eosinophils Relative: 7 %
HCT: 36.3 % (ref 36.0–46.0)
Hemoglobin: 12.1 g/dL (ref 12.0–15.0)
Lymphocytes Relative: 10 %
Lymphs Abs: 0.5 10*3/uL — ABNORMAL LOW (ref 0.7–4.0)
MCH: 31.2 pg (ref 26.0–34.0)
MCHC: 33.3 g/dL (ref 30.0–36.0)
MCV: 93.6 fL (ref 78.0–100.0)
MONO ABS: 0.4 10*3/uL (ref 0.1–1.0)
Monocytes Relative: 8 %
Neutro Abs: 3.6 10*3/uL (ref 1.7–7.7)
Neutrophils Relative %: 74 %
Platelets: 201 10*3/uL (ref 150–400)
RBC: 3.88 MIL/uL (ref 3.87–5.11)
RDW: 14.4 % (ref 11.5–15.5)
WBC: 4.9 10*3/uL (ref 4.0–10.5)

## 2015-06-17 LAB — BONE MARROW EXAM

## 2015-06-17 MED ORDER — MEPERIDINE HCL 25 MG/ML IJ SOLN
INTRAMUSCULAR | Status: AC | PRN
  Administered 2015-06-17: 25 mg via INTRAVENOUS

## 2015-06-17 MED ORDER — SODIUM CHLORIDE 0.9 % IV SOLN
INTRAVENOUS | Status: DC
  Administered 2015-06-17: 07:00:00 via INTRAVENOUS

## 2015-06-17 MED ORDER — HEPARIN SOD (PORK) LOCK FLUSH 100 UNIT/ML IV SOLN
500.0000 [IU] | INTRAVENOUS | Status: AC | PRN
  Administered 2015-06-17: 500 [IU]
  Filled 2015-06-17: qty 5

## 2015-06-17 MED ORDER — MIDAZOLAM HCL 5 MG/5ML IJ SOLN
INTRAMUSCULAR | Status: AC | PRN
  Administered 2015-06-17: 2.5 mg via INTRAVENOUS
  Administered 2015-06-17 (×2): 1 mg via INTRAVENOUS

## 2015-06-17 MED ORDER — MIDAZOLAM HCL 2 MG/2ML IJ SOLN: INTRAMUSCULAR | Status: AC | PRN

## 2015-06-17 MED ORDER — MIDAZOLAM HCL 5 MG/ML IJ SOLN
10.0000 mg | Freq: Once | INTRAMUSCULAR | Status: DC
  Filled 2015-06-17: qty 2

## 2015-06-17 MED ORDER — MEPERIDINE HCL 50 MG/ML IJ SOLN
50.0000 mg | Freq: Once | INTRAMUSCULAR | Status: DC
  Filled 2015-06-17: qty 1

## 2015-06-17 NOTE — Procedures (Signed)
This is a bone marrow biopsy and aspirate note for Shannon Parsons. She is boxy short stay unit at [facility].  She had a Port-A-Cath already accessed.  We did the appropriate timeout procedure at 7:50 AM.  Her Mallimpati score is 2. Her ASA class is 2.  We obtain the informed consent. We did this at 7:48 AM.  We then placed onto her right side. She received a total of 4.5 mg of Versed and 25 mg of Demerol for IV sedation.  The left posterior iliac crest was prepped and draped in sterile fashion. We used 5 mL of 1% lidocaine infiltrated is under the skin down to the periosteum.  We made an incision into the skin with a #11 scalpel.  Despite several times, I cannot aspirate out any liquid.  I then used the biopsy aspirate needle and obtained to bone marrow biopsy cores.  She tolerated the procedure well. We cleaned and dressed the procedure site sterilely.  There were no complications.  Laurey Arrow

## 2015-06-17 NOTE — Discharge Instructions (Signed)
Bone Marrow Aspiration, Bone Marrow Biopsy °Care After °Read the instructions outlined below and refer to this sheet in the next few weeks. These discharge instructions provide you with general information on caring for yourself after you leave the hospital. Your caregiver may also give you specific instructions. While your treatment has been planned according to the most current medical practices available, unavoidable complications occasionally occur. If you have any problems or questions after discharge, call your caregiver. °FINDING OUT THE RESULTS OF YOUR TEST °Not all test results are available during your visit. If your test results are not back during the visit, make an appointment with your caregiver to find out the results. Do not assume everything is normal if you have not heard from your caregiver or the medical facility. It is important for you to follow up on all of your test results.  °HOME CARE INSTRUCTIONS  °You have had sedation and may be sleepy or dizzy. Your thinking may not be as clear as usual. For the next 24 hours: °· Only take over-the-counter or prescription medicines for pain, discomfort, and or fever as directed by your caregiver. °· Do not drink alcohol. °· Do not smoke. °· Do not drive. °· Do not make important legal decisions. °· Do not operate heavy machinery. °· Do not care for small children by yourself. °· Keep your dressing clean and dry. You may replace dressing with a bandage after 24 hours. °· You may take a bath or shower after 24 hours. °· Use an ice pack for 20 minutes every 2 hours while awake for pain as needed. °SEEK MEDICAL CARE IF:  °· There is redness, swelling, or increasing pain at the biopsy site. °· There is pus coming from the biopsy site. °· There is drainage from a biopsy site lasting longer than one day. °· An unexplained oral temperature above 102° F (38.9° C) develops. °SEEK IMMEDIATE MEDICAL CARE IF:  °· You develop a rash. °· You have difficulty  breathing. °· You develop any reaction or side effects to medications given. °Document Released: 03/19/2005 Document Revised: 11/22/2011 Document Reviewed: 08/27/2008 °ExitCare® Patient Information ©2015 [employer]. This information is not intended to replace advice given to you by your health care provider. Make sure you discuss any questions you have with your health care provider. °Conscious Sedation, Adult, Care After °Refer to this sheet in the next few weeks. These instructions provide you with information on caring for yourself after your procedure. Your health care provider may also give you more specific instructions. Your treatment has been planned according to current medical practices, but problems sometimes occur. Call your health care provider if you have any problems or questions after your procedure. °WHAT TO EXPECT AFTER THE PROCEDURE  °After your procedure: °· You may feel sleepy, clumsy, and have poor balance for several hours. °· Vomiting may occur if you eat too soon after the procedure. °HOME CARE INSTRUCTIONS °· Do not participate in any activities where you could become injured for at least 24 hours. Do not: °¨ Drive. °¨ Swim. °¨ Ride a bicycle. °¨ Operate heavy machinery. °¨ Cook. °¨ Use power tools. °¨ Climb ladders. °¨ Work from a high place. °· Do not make important decisions or sign legal documents until you are improved. °· If you vomit, drink water, juice, or soup when you can drink without vomiting. Make sure you have little or no nausea before eating solid foods. °· Only take over-the-counter or prescription medicines for pain, discomfort, or fever   as directed by your health care provider.  Make sure you and your family fully understand everything about the medicines given to you, including what side effects may occur.  You should not drink alcohol, take sleeping pills, or take medicines that cause drowsiness for at least 24 hours.  If you smoke, do not smoke without  supervision.  If you are feeling better, you may resume normal activities 24 hours after you were sedated.  Keep all appointments with your health care provider. SEEK MEDICAL CARE IF:  Your skin is pale or bluish in color.  You continue to feel nauseous or vomit.  Your pain is getting worse and is not helped by medicine.  You have bleeding or swelling.  You are still sleepy or feeling clumsy after 24 hours. SEEK IMMEDIATE MEDICAL CARE IF:  You develop a rash.  You have difficulty breathing.  You develop any type of allergic problem.  You have a fever. MAKE SURE YOU:  Understand these instructions.  Will watch your condition.  Will get help right away if you are not doing well or get worse. Document Released: 06/20/2013 Document Reviewed: 06/20/2013 [facility] Patient Information 2015 [place], [place]. This information is not intended to replace advice given to you by your health care provider. Make sure you discuss any questions you have with your health care provider.

## 2015-06-24 ENCOUNTER — Other Ambulatory Visit (HOSPITAL_BASED_OUTPATIENT_CLINIC_OR_DEPARTMENT_OTHER): Payer: Medicare Other

## 2015-06-24 ENCOUNTER — Ambulatory Visit (HOSPITAL_BASED_OUTPATIENT_CLINIC_OR_DEPARTMENT_OTHER): Payer: Medicare Other | Admitting: Hematology & Oncology

## 2015-06-24 ENCOUNTER — Telehealth: Payer: Self-pay | Admitting: Hematology & Oncology

## 2015-06-24 ENCOUNTER — Encounter: Payer: Self-pay | Admitting: Hematology & Oncology

## 2015-06-24 VITALS — BP 95/75 | HR 79 | Temp 97.4°F | Resp 16 | Ht 64.0 in | Wt 119.0 lb

## 2015-06-24 DIAGNOSIS — C8313 Mantle cell lymphoma, intra-abdominal lymph nodes: Secondary | ICD-10-CM

## 2015-06-24 DIAGNOSIS — C8310 Mantle cell lymphoma, unspecified site: Secondary | ICD-10-CM | POA: Diagnosis not present

## 2015-06-24 DIAGNOSIS — G4700 Insomnia, unspecified: Secondary | ICD-10-CM

## 2015-06-24 LAB — COMPREHENSIVE METABOLIC PANEL (CC13)
ALBUMIN: 3.8 g/dL (ref 3.5–5.0)
ALK PHOS: 77 U/L (ref 40–150)
ALT: 17 U/L (ref 0–55)
AST: 27 U/L (ref 5–34)
Anion Gap: 9 mEq/L (ref 3–11)
BUN: 10 mg/dL (ref 7.0–26.0)
CHLORIDE: 103 meq/L (ref 98–109)
CO2: 25 meq/L (ref 22–29)
Calcium: 9.4 mg/dL (ref 8.4–10.4)
Creatinine: 0.7 mg/dL (ref 0.6–1.1)
EGFR: 82 mL/min/{1.73_m2} — AB (ref >90)
GLUCOSE: 85 mg/dL (ref 70–140)
POTASSIUM: 3.9 meq/L (ref 3.5–5.1)
SODIUM: 136 meq/L (ref 136–145)
Total Bilirubin: 0.54 mg/dL (ref 0.20–1.20)
Total Protein: 6.2 g/dL — ABNORMAL LOW (ref 6.4–8.3)

## 2015-06-24 LAB — CBC WITH DIFFERENTIAL (CANCER CENTER ONLY)
BASO#: 0.1 10*3/uL (ref 0.0–0.2)
BASO%: 2.7 % — AB (ref 0.0–2.0)
EOS%: 7.1 % — AB (ref 0.0–7.0)
Eosinophils Absolute: 0.3 10*3/uL (ref 0.0–0.5)
HEMATOCRIT: 35.5 % (ref 34.8–46.6)
HGB: 11.6 g/dL (ref 11.6–15.9)
LYMPH#: 0.4 10*3/uL — AB (ref 0.9–3.3)
LYMPH%: 7.5 % — ABNORMAL LOW (ref 14.0–48.0)
MCH: 31.4 pg (ref 26.0–34.0)
MCHC: 32.7 g/dL (ref 32.0–36.0)
MCV: 96 fL (ref 81–101)
MONO#: 0.5 10*3/uL (ref 0.1–0.9)
MONO%: 9.8 % (ref 0.0–13.0)
NEUT%: 72.9 % (ref 39.6–80.0)
NEUTROS ABS: 3.5 10*3/uL (ref 1.5–6.5)
Platelets: 183 10*3/uL (ref 145–400)
RBC: 3.7 10*6/uL (ref 3.70–5.32)
RDW: 13.6 % (ref 11.1–15.7)
WBC: 4.8 10*3/uL (ref 3.9–10.0)

## 2015-06-24 NOTE — Progress Notes (Signed)
Hematology and Oncology Follow Up Visit  LAKRISTA SCADUTO [id number] 02/17/1941 74 y.o. 06/24/2015   Principle Diagnosis:   Mantle cell lymphoma (MCIPI = 5)  Thromboembolic event of the right leg  Current Therapy:    Patient s/p cycle #4 of Treanda/Rituxan  Xarelto 20 mg by mouth daily     Interim History:  Ms.  Ambroise is back for follow-up.we completed all of her chemotherapy back in August. She tolerated treatment and currently well.  Her post chemotherapy studies looked fantastic. A PET scan that was done the end of September showed no evidence of recurrent lymphoma.  We then did a bone marrow biopsy on her. Her initial bone marrow biopsy was positive for lymphoma. However, the pathology report ([id number]) did not show any obvious lymphoma. She had a focal cellular lymphoid aggregate. There is no monoclonal population of cells in the marrow. Her other marrow cytogenetics were negative for mantle cell lymphoma. She did not have the t11:14 abnormality. She had had had this previously.  I talked to her and her husband. I told him that she is in remission but not cured. With mantle cell lymphoma, there is always the risk of reoccurrence.  She is not a candidate for any high-dose chemotherapy. She has a marginal performance status and I'm not sure she would be able to tolerate high-dose chemotherapy with stem cell support.  I think that she would be a candidate for maintenance Rituxan. I think this would be reasonable for her.  She is eating better. She has had no fever. She's had no change in bowel or bladder habits. She has had no nausea or vomiting. There has been no bleeding.  Overall, her performance status is ECOG 2.   Medications:  Current outpatient prescriptions:  .  acetaminophen (TYLENOL) 325 MG tablet, Take 325 mg by mouth every 6 (six) hours as needed (For pain.). , Disp: , Rfl:  .  ALPRAZolam (XANAX) 0.5 MG tablet, Take 0.5 mg by mouth at bedtime as needed for  anxiety., Disp: , Rfl:  .  Cholecalciferol (VITAMIN D) 2000 UNITS CAPS, Take 2,000-4,000 capsules by mouth every other day. Patient alternates 1 cap with 2 caps every other day., Disp: , Rfl:  .  hyoscyamine (LEVSIN, ANASPAZ) 0.125 MG tablet, Take 1 to 2 tablets every 4 hours as needed for nausea, cramping, bloating or diarrhea, Disp: 90 tablet, Rfl: 11 .  levothyroxine (SYNTHROID, LEVOTHROID) 50 MCG tablet, TAKE 1 TABLET BY MOUTH ONCE DAILY., Disp: 90 tablet, Rfl: 3 .  lidocaine-prilocaine (EMLA) cream, Apply 1 application topically as needed. Apply to [facility] one hour before access., Disp: 30 g, Rfl: 3 .  Multiple Vitamin (MULTIVITAMIN WITH MINERALS) TABS tablet, Take 1 tablet by mouth daily., Disp: , Rfl:  .  Probiotic Product (PROBIOTIC DAILY PO), Take 1 capsule by mouth daily., Disp: , Rfl:  .  ranitidine (ZANTAC) 300 MG tablet, Take 300 mg by mouth daily as needed for heartburn. , Disp: , Rfl:  .  rivaroxaban (XARELTO) 20 MG TABS tablet, Take 1 tablet (20 mg total) by mouth daily with supper., Disp: 15 tablet, Rfl: 0 .  vitamin B-12 (CYANOCOBALAMIN) 1000 MCG tablet, Take 1,000 mcg by mouth daily., Disp: , Rfl:   Allergies:  Allergies  Allergen Reactions  . Actonel [Risedronate Sodium] Other (See Comments)    "made me choke"   . Fosamax [Alendronate] Other (See Comments)    "made me choke"  . Sulfa Antibiotics Other (See Comments)    Doesn't remember   .  Tetracyclines & Related Rash    Past Medical History, Surgical history, Social history, and Family History were reviewed and updated.  Review of Systems: As above  Physical Exam:  height is 5' 4"  (1.626 m) and weight is 119 lb (53.978 kg). Her oral temperature is 97.4 F (36.3 C). Her blood pressure is 95/75 and her pulse is 79. Her respiration is 16.   Thin but fairly well-nourished white female. Head and neck exam shows no ocular or oral lesions. She has no bilateral cervical or supraclavicular lymph nodes. She has no palpable  thyroid. Lungs are clear. Cardiac exam regular rate and rhythm with no murmurs, rubs or bruits. Axillary exam shows no obvious bilateral axillary adenopathy. Abdomen is soft. She has decent bowel sounds. There is no fluid wave. Her spleen tip is just below the left costal margin. There is no hepatomegaly. Back exam shows no tenderness over the spine, ribs or hips. Extremities shows no clubbing, cyanosis or edema. No palpable venous cord is noted in the right leg. She has good range of motion of her joints. She has good strength in her muscles. Neurological exam is nonfocal. Skin exam shows no rashes, ecchymoses or petechia  Lab Results  Component Value Date   WBC 4.8 06/24/2015   HGB 11.6 06/24/2015   HCT 35.5 06/24/2015   MCV 96 06/24/2015   PLT 183 06/24/2015     Chemistry      Component Value Date/Time   NA 137 04/23/2015 0803   NA 135 04/02/2015 1511   K 3.4 04/23/2015 0803   K 3.7 04/02/2015 1511   CL 107 04/23/2015 0803   CL 102 04/02/2015 1511   CO2 24 04/23/2015 0803   CO2 22 04/02/2015 1511   BUN 16 04/23/2015 0803   BUN 16 04/02/2015 1511   CREATININE 1.0 04/23/2015 0803   CREATININE 0.64 02/22/2015 0405      Component Value Date/Time   CALCIUM 9.9 04/23/2015 0803   CALCIUM 8.9 04/02/2015 1511   ALKPHOS 48 04/23/2015 0803   ALKPHOS 48 04/02/2015 1511   AST 29 04/23/2015 0803   AST 19 04/02/2015 1511   ALT 25 04/23/2015 0803   ALT 11 04/02/2015 1511   BILITOT 0.70 04/23/2015 0803   BILITOT 0.3 04/02/2015 1511         Impression and Plan: Ms. Millward is 74 year old white female. She has mantle cell lymphoma. [facility], [facility] she is an intermediate risk.  She has responded very well. Again, we have to watch out for recurrence. She is at risk for recurrence.  Again manus Rituxan would be reasonable. I think she could tolerate this.  I will going get her set up with maintenance Rituxan for 3 weeks. I think this would be reasonable. This way, we can get her  through the holidays.  I'm just grateful that she has responded so well. We first treated her with a fairly aggressive regimen of R-CHOP in which increased he was replaced by Velcade. She had a very tough time with this protocol. However, it worked. We then gave her 4 cycles of Treanda/Rituxan. She did much better with this.  We will plan to have her come back for Rituxan by itself in early November. I will plan to see her back in January 2017.  I spent about 35 minutes with she and her husband this morning.  Volanda Napoleon, MD 10/11/201610:57 AM

## 2015-06-24 NOTE — Telephone Encounter (Signed)
Year 2016 J9310 Injection, rituximab, 600 mg  - RITUXAN APPROVED Valid: 10/14/2014 - 09/13/2015 Ref: [id number]  Year 2017 9310 Injection, rituximab, 600 mg  - RITUXAN APPROVED Valid: 09/14/2015 - 09/12/2016 Ref: [id number]

## 2015-06-25 LAB — LACTATE DEHYDROGENASE: LDH: 195 U/L (ref 94–250)

## 2015-06-25 LAB — PREALBUMIN: Prealbumin: 15 mg/dL — ABNORMAL LOW (ref 17–34)

## 2015-06-25 LAB — CHROMOSOME ANALYSIS, BONE MARROW

## 2015-06-25 LAB — TISSUE HYBRIDIZATION (BONE MARROW)-NCBH

## 2015-07-01 ENCOUNTER — Encounter (HOSPITAL_COMMUNITY): Payer: Self-pay

## 2015-07-04 ENCOUNTER — Ambulatory Visit (INDEPENDENT_AMBULATORY_CARE_PROVIDER_SITE_OTHER): Payer: Medicare Other | Admitting: Internal Medicine

## 2015-07-04 ENCOUNTER — Encounter: Payer: Self-pay | Admitting: Internal Medicine

## 2015-07-04 VITALS — BP 106/70 | HR 68 | Temp 98.0°F | Resp 16 | Ht 63.0 in | Wt 117.0 lb

## 2015-07-04 DIAGNOSIS — J069 Acute upper respiratory infection, unspecified: Secondary | ICD-10-CM

## 2015-07-04 MED ORDER — PREDNISONE 20 MG PO TABS: ORAL_TABLET | ORAL | Status: DC

## 2015-07-04 MED ORDER — AZITHROMYCIN 250 MG PO TABS: ORAL_TABLET | ORAL | Status: DC

## 2015-07-04 MED ORDER — ALBUTEROL SULFATE HFA 108 (90 BASE) MCG/ACT IN AERS: 2.0000 | INHALATION_SPRAY | Freq: Four times a day (QID) | RESPIRATORY_TRACT | Status: DC | PRN

## 2015-07-04 NOTE — Progress Notes (Signed)
Patient ID: Shannon Parsons, female   DOB: Mar 11, 1941, 74 y.o.   MRN: [id number]  HPI  Patient presents to the office for evaluation of cough.  It has been going on for 1 weeks.  Patient reports all the time, dry, feels like she needs to cough stuff up but can't get it there..  They also endorse postnasal drip, shortness of breath, wheezing and chest congestion.  They have tried none.  They report that nothing has worked.  They denies other sick contacts.  She reports that her nurse neighbor listened and said she should come in.  She doesn't tend to have issues with bad seasonal allergies.  Review of Systems  Constitutional: Negative for fever, chills and malaise/fatigue.  HENT: Negative for congestion, ear pain and sore throat.   Eyes: Negative.   Respiratory: Positive for cough. Negative for sputum production, shortness of breath and wheezing.   Cardiovascular: Negative for chest pain, palpitations and leg swelling.  Gastrointestinal: Negative for heartburn, abdominal pain, diarrhea, constipation, blood in stool and melena.  Skin: Negative.   Neurological: Negative for headaches.    PE:  General:  Alert and non-toxic, WDWN, NAD HEENT: NCAT, PERLA, EOM normal, no occular discharge or erythema.  Nasal mucosal edema with sinus tenderness to palpation.  Oropharynx clear with minimal oropharyngeal edema and erythema.  Mucous membranes moist and pink. Neck:  Cervical adenopathy Chest:  RRR no MRGs.  Lungs clear to auscultation A&P with no wheezes rhonchi or rales.   Abdomen: +BS x 4 quadrants, soft, non-tender, no guarding, rigidity, or rebound. Skin: warm and dry no rash Neuro: A&Ox4, CN II-XII grossly intact  Assessment and Plan:   1. Acute URI -try prednisone -albuterol -zpak -mucinex -claritin

## 2015-07-09 ENCOUNTER — Other Ambulatory Visit: Payer: Self-pay | Admitting: Internal Medicine

## 2015-07-09 MED ORDER — BUDESONIDE 0.25 MG/2ML IN SUSP: 0.2500 mg | Freq: Every day | RESPIRATORY_TRACT | Status: DC

## 2015-07-09 MED ORDER — ALBUTEROL SULFATE (2.5 MG/3ML) 0.083% IN NEBU: 2.5000 mg | INHALATION_SOLUTION | RESPIRATORY_TRACT | Status: DC | PRN

## 2015-07-10 ENCOUNTER — Encounter: Payer: Self-pay | Admitting: Pharmacist

## 2015-07-17 ENCOUNTER — Ambulatory Visit (HOSPITAL_BASED_OUTPATIENT_CLINIC_OR_DEPARTMENT_OTHER): Payer: Medicare Other

## 2015-07-17 VITALS — BP 122/61 | HR 61 | Temp 98.3°F | Resp 20 | Wt 110.5 lb

## 2015-07-17 DIAGNOSIS — Z5112 Encounter for antineoplastic immunotherapy: Secondary | ICD-10-CM | POA: Diagnosis not present

## 2015-07-17 DIAGNOSIS — C8313 Mantle cell lymphoma, intra-abdominal lymph nodes: Secondary | ICD-10-CM

## 2015-07-17 DIAGNOSIS — C8310 Mantle cell lymphoma, unspecified site: Secondary | ICD-10-CM

## 2015-07-17 MED ORDER — DIPHENHYDRAMINE HCL 25 MG PO CAPS
50.0000 mg | ORAL_CAPSULE | Freq: Once | ORAL | Status: AC
  Administered 2015-07-17: 50 mg via ORAL

## 2015-07-17 MED ORDER — DIPHENHYDRAMINE HCL 25 MG PO CAPS
ORAL_CAPSULE | ORAL | Status: AC
  Filled 2015-07-17: qty 1

## 2015-07-17 MED ORDER — SODIUM CHLORIDE 0.9 % IJ SOLN
10.0000 mL | INTRAMUSCULAR | Status: DC | PRN
  Administered 2015-07-17: 10 mL
  Filled 2015-07-17: qty 10

## 2015-07-17 MED ORDER — ACETAMINOPHEN 325 MG PO TABS
ORAL_TABLET | ORAL | Status: AC
  Filled 2015-07-17: qty 2

## 2015-07-17 MED ORDER — SODIUM CHLORIDE 0.9 % IV SOLN
Freq: Once | INTRAVENOUS | Status: AC
  Administered 2015-07-17: 08:00:00 via INTRAVENOUS

## 2015-07-17 MED ORDER — ACETAMINOPHEN 325 MG PO TABS
650.0000 mg | ORAL_TABLET | Freq: Once | ORAL | Status: AC
  Administered 2015-07-17: 650 mg via ORAL

## 2015-07-17 MED ORDER — HEPARIN SOD (PORK) LOCK FLUSH 100 UNIT/ML IV SOLN
500.0000 [IU] | Freq: Once | INTRAVENOUS | Status: AC | PRN
  Administered 2015-07-17: 500 [IU]
  Filled 2015-07-17: qty 5

## 2015-07-17 MED ORDER — RITUXIMAB CHEMO INJECTION 500 MG/50ML
375.0000 mg/m2 | Freq: Once | INTRAVENOUS | Status: AC
  Administered 2015-07-17: 600 mg via INTRAVENOUS
  Filled 2015-07-17: qty 60

## 2015-07-17 NOTE — Patient Instructions (Signed)
Rituximab injection What is this medicine? RITUXIMAB (ri TUX i mab) is a monoclonal antibody. It is used commonly to treat non-Hodgkin lymphoma and other conditions. It is also used to treat rheumatoid arthritis (RA). In RA, this medicine slows the inflammatory process and help reduce joint pain and swelling. This medicine is often used with other cancer or arthritis medications. This medicine may be used for other purposes; ask your health care provider or pharmacist if you have questions. What should I tell my health care provider before I take this medicine? They need to know if you have any of these conditions: -blood disorders -heart disease -history of hepatitis B -infection (especially a virus infection such as chickenpox, cold sores, or herpes) -irregular heartbeat -kidney disease -lung or breathing disease, like asthma -lupus -an unusual or allergic reaction to rituximab, mouse proteins, other medicines, foods, dyes, or preservatives -pregnant or trying to get pregnant -breast-feeding How should I use this medicine? This medicine is for infusion into a vein. It is administered in a hospital or clinic by a specially trained health care professional. A special MedGuide will be given to you by the pharmacist with each prescription and refill. Be sure to read this information carefully each time. Talk to your pediatrician regarding the use of this medicine in children. This medicine is not approved for use in children. Overdosage: If you think you have taken too much of this medicine contact a poison control center or emergency room at once. NOTE: This medicine is only for you. Do not share this medicine with others. What if I miss a dose? It is important not to miss a dose. Call your doctor or health care professional if you are unable to keep an appointment. What may interact with this medicine? -cisplatin -medicines for blood pressure -some other medicines for  arthritis -vaccines This list may not describe all possible interactions. Give your health care provider a list of all the medicines, herbs, non-prescription drugs, or dietary supplements you use. Also tell them if you smoke, drink alcohol, or use illegal drugs. Some items may interact with your medicine. What should I watch for while using this medicine? Report any side effects that you notice during your treatment right away, such as changes in your breathing, fever, chills, dizziness or lightheadedness. These effects are more common with the first dose. Visit your prescriber or health care professional for checks on your progress. You will need to have regular blood work. Report any other side effects. The side effects of this medicine can continue after you finish your treatment. Continue your course of treatment even though you feel ill unless your doctor tells you to stop. Call your doctor or health care professional for advice if you get a fever, chills or sore throat, or other symptoms of a cold or flu. Do not treat yourself. This drug decreases your body's ability to fight infections. Try to avoid being around people who are sick. This medicine may increase your risk to bruise or bleed. Call your doctor or health care professional if you notice any unusual bleeding. Be careful brushing and flossing your teeth or using a toothpick because you may get an infection or bleed more easily. If you have any dental work done, tell your dentist you are receiving this medicine. Avoid taking products that contain aspirin, acetaminophen, ibuprofen, naproxen, or ketoprofen unless instructed by your doctor. These medicines may hide a fever. Do not become pregnant while taking this medicine. Women should inform their doctor if   they wish to become pregnant or think they might be pregnant. There is a potential for serious side effects to an unborn child. Talk to your health care professional or pharmacist for more  information. Do not breast-feed an infant while taking this medicine. What side effects may I notice from receiving this medicine? Side effects that you should report to your doctor or health care professional as soon as possible: -allergic reactions like skin rash, itching or hives, swelling of the face, lips, or tongue -low blood counts - this medicine may decrease the number of white blood cells, red blood cells and platelets. You may be at increased risk for infections and bleeding. -signs of infection - fever or chills, cough, sore throat, pain or difficulty passing urine -signs of decreased platelets or bleeding - bruising, pinpoint red spots on the skin, black, tarry stools, blood in the urine -signs of decreased red blood cells - unusually weak or tired, fainting spells, lightheadedness -breathing problems -confused, not responsive -chest pain -fast, irregular heartbeat -feeling faint or lightheaded, falls -mouth sores -redness, blistering, peeling or loosening of the skin, including inside the mouth -stomach pain -swelling of the ankles, feet, or hands -trouble passing urine or change in the amount of urine Side effects that usually do not require medical attention (report to your doctor or other health care professional if they continue or are bothersome): -anxiety -headache -loss of appetite -muscle aches -nausea -night sweats This list may not describe all possible side effects. Call your doctor for medical advice about side effects. You may report side effects to FDA at 1-800-FDA-1088. Where should I keep my medicine? This drug is given in a hospital or clinic and will not be stored at home. NOTE: This sheet is a summary. It may not cover all possible information. If you have questions about this medicine, talk to your doctor, pharmacist, or health care provider.    2016, Elsevier/Gold Standard. (2014-11-06 22:30:56)  

## 2015-07-21 ENCOUNTER — Encounter: Payer: Self-pay | Admitting: Internal Medicine

## 2015-07-21 ENCOUNTER — Ambulatory Visit (INDEPENDENT_AMBULATORY_CARE_PROVIDER_SITE_OTHER): Payer: Medicare Other | Admitting: Internal Medicine

## 2015-07-21 VITALS — BP 136/70 | HR 74 | Temp 98.6°F | Resp 16 | Ht 63.5 in | Wt 119.0 lb

## 2015-07-21 DIAGNOSIS — Z789 Other specified health status: Secondary | ICD-10-CM | POA: Diagnosis not present

## 2015-07-21 DIAGNOSIS — C8310 Mantle cell lymphoma, unspecified site: Secondary | ICD-10-CM

## 2015-07-21 DIAGNOSIS — Z6820 Body mass index (BMI) 20.0-20.9, adult: Secondary | ICD-10-CM

## 2015-07-21 DIAGNOSIS — K589 Irritable bowel syndrome without diarrhea: Secondary | ICD-10-CM

## 2015-07-21 DIAGNOSIS — E559 Vitamin D deficiency, unspecified: Secondary | ICD-10-CM | POA: Diagnosis not present

## 2015-07-21 DIAGNOSIS — K2100 Gastro-esophageal reflux disease with esophagitis, without bleeding: Secondary | ICD-10-CM

## 2015-07-21 DIAGNOSIS — J45909 Unspecified asthma, uncomplicated: Secondary | ICD-10-CM

## 2015-07-21 DIAGNOSIS — Z1389 Encounter for screening for other disorder: Secondary | ICD-10-CM

## 2015-07-21 DIAGNOSIS — J449 Chronic obstructive pulmonary disease, unspecified: Secondary | ICD-10-CM

## 2015-07-21 DIAGNOSIS — E785 Hyperlipidemia, unspecified: Secondary | ICD-10-CM

## 2015-07-21 DIAGNOSIS — Z0001 Encounter for general adult medical examination with abnormal findings: Secondary | ICD-10-CM

## 2015-07-21 DIAGNOSIS — Z1212 Encounter for screening for malignant neoplasm of rectum: Secondary | ICD-10-CM

## 2015-07-21 DIAGNOSIS — I10 Essential (primary) hypertension: Secondary | ICD-10-CM

## 2015-07-21 DIAGNOSIS — J4489 Other specified chronic obstructive pulmonary disease: Secondary | ICD-10-CM

## 2015-07-21 DIAGNOSIS — Z1331 Encounter for screening for depression: Secondary | ICD-10-CM

## 2015-07-21 DIAGNOSIS — Z79899 Other long term (current) drug therapy: Secondary | ICD-10-CM

## 2015-07-21 DIAGNOSIS — R6889 Other general symptoms and signs: Secondary | ICD-10-CM

## 2015-07-21 DIAGNOSIS — G250 Essential tremor: Secondary | ICD-10-CM | POA: Diagnosis not present

## 2015-07-21 DIAGNOSIS — G4733 Obstructive sleep apnea (adult) (pediatric): Secondary | ICD-10-CM

## 2015-07-21 DIAGNOSIS — R7303 Prediabetes: Secondary | ICD-10-CM

## 2015-07-21 DIAGNOSIS — Z9181 History of falling: Secondary | ICD-10-CM

## 2015-07-21 LAB — HEMOGLOBIN A1C
HEMOGLOBIN A1C: 5.7 % — AB (ref <5.7)
MEAN PLASMA GLUCOSE: 117 mg/dL — AB (ref <117)

## 2015-07-21 LAB — LIPID PANEL
CHOLESTEROL: 235 mg/dL — AB (ref 125–200)
HDL: 49 mg/dL (ref >=46)
LDL Cholesterol: 144 mg/dL — ABNORMAL HIGH (ref <130)
TRIGLYCERIDES: 209 mg/dL — AB (ref <150)
Total CHOL/HDL Ratio: 4.8 Ratio (ref <=5.0)
VLDL: 42 mg/dL — AB (ref <30)

## 2015-07-21 LAB — MAGNESIUM: Magnesium: 1.9 mg/dL (ref 1.5–2.5)

## 2015-07-21 LAB — TSH: TSH: 1.923 u[IU]/mL (ref 0.350–4.500)

## 2015-07-21 NOTE — Patient Instructions (Signed)

## 2015-07-21 NOTE — Progress Notes (Signed)
Patient ID: Shannon Parsons, female   DOB: 1941-05-19, 74 y.o.   MRN: [id number]  Medicare  Annual  Wellness Visit And  Comprehensive Evaluation, Examination and Management   Assessment:   1. Essential hypertension  - Microalbumin / creatinine urine ratio - EKG 12-Lead - TSH  2. Hyperlipidemia  - Lipid panel  3. Prediabetes  - Hemoglobin A1c - Insulin, random  4. Vitamin D deficiency  - Vit D  25 hydroxy (rtn osteoporosis monitoring)  5. GERD   6. IBS   7. Mantle cell lymphoma, unspecified body region ([place])   8. Essential tremor   9. Chronic obstructive airway disease with asthma ([place])   10. OSA (obstructive sleep apnea)   11. Screening for rectal cancer  - POC Hemoccult Bld/Stl (3-Cd Home Screen); Future  12. Depression screen   13. At low risk for fall   14. Body mass index (BMI) of 20.0-20.9 in adult   15. Encounter for general adult medical examination with abnormal findings   16. Medication management  - Urinalysis, Routine w reflex microscopic (not at [facility]) - Magnesium  Plan:   During the course of the visit the patient was educated and counseled about appropriate screening and preventive services including:    Pneumococcal vaccine   Influenza vaccine  Td vaccine  Screening electrocardiogram  Bone densitometry screening  Colorectal cancer screening  Diabetes screening  Glaucoma screening  Nutrition counseling   Advanced directives: requested  Screening recommendations, referrals: Vaccinations:  Immunization History  Administered Date(s) Administered  . Pneumococcal Polysaccharide-23 06/18/2013  . Zoster 04/17/2013  Tdap vaccine out of stock Influenza vaccine declined Prevnar vaccine out of stock Hep B vaccine not indicated  Nutrition assessed and recommended  Colonoscopy 08/19/2011 Recommended yearly ophthalmology/optometry visit for glaucoma screening and checkup Recommended yearly dental visit for hygiene  and checkup Advanced directives - yes  Conditions/risks identified: BMI: Discussed weight loss, diet, and increase physical activity.  Increase physical activity: AHA recommends 150 minutes of physical activity a week.  Medications reviewed PreDiabetes is at goal, ACE/ARB therapy: Not Indicated Urinary Incontinence is not an issue: discussed non pharmacology and pharmacology options.  Fall risk: low- discussed PT, home fall assessment, medications.   Subjective:        Shannon Parsons  presents for [facility] Annual Wellness Visit and also presents for a comprehensive evaluation, examination and management of multiple medical co-morbidities.  Date of last medicare wellness visit was 06/11/2014.  This very nice 73 y.o. MWF presents for annual follow up with Hypertension, Hyperlipidemia, Pre-Diabetes and Vitamin D Deficiency. Patient was dx'd this year with Mantle Cell Lymphoma in Jan and underwent Chemotx by Dr Marin Olp and most recent PET scan/Bone marrow  In Sept showed remission and patient has been started on maintenance chemotx with Rituxan q87mo.  In April she was dx'd with DVT of the R leg & initiated on Xarelto which she continues. She has scheduled f/u in Jan 2017 with Dr Marin Olp.        Patient has long hx/o of labile HTN and has been followed expectantly & BP has been controlled and today's BP: 136/70 mmHg. Patient has had no complaints of any cardiac type chest pain, palpitations, dyspnea/orthopnea/PND, dizziness, claudication, or dependent edema.       Hyperlipidemia has been treated in the past for hyperlipidemia , but since losing 30# over the last 2 years her lipids have  improved. Patient denies myalgias or other med SE's. Last Lipids were not at goal with Cholesterol  189; HDL 34*; LDL 133*; Triglycerides 111 on 08/21/2014.       Also, the patient has history of PreDiabetes with A1c 5.9% in 2012 and 5.8% in 2014 and she has had no symptoms of reactive hypoglycemia, diabetic polys,  paresthesias or visual blurring.  Last A1c was 5.0% on 02/21/2015.        Further, the patient also has history of Vitamin D Deficiency and supplements vitamin D without any suspected side-effects. Last vitamin D was still low  At 37 off supplements on  12/16/2014.  Names of Other Physician/Practitioners you currently use: 1. [facility] here for primary care 2. Dr Sherral Hammers, eye doctor, last visit May 2016 3. Dr Ivor Messier, dentist, last visit Sept 2016  Patient Care Team: Unk Pinto, MD as PCP - General (Internal Medicine) Ladene Artist, MD as Consulting Physician (Gastroenterology) Satira Sark, MD as Consulting Physician (Cardiology) Kathee Delton, MD as Consulting Physician (Pulmonary Disease) Volanda Napoleon, MD as Consulting Physician (Oncology) Volanda Napoleon, MD as Consulting Physician (Oncology)  Medication Review: Medication Sig  . acetaminophen (TYLENOL) 325 MG tablet Take 325 mg by mouth every 6 (six) hours as needed (For pain.).   [name] albuterol  HFA inhaler Inhale 2 puffs into the lungs every 6 (six) hours as needed for wheezing or shortness of breath (cough).  [name] albuterol  (2.5 MG/3ML) 0.083% neb soln Take 3 mLs (2.5 mg total) by nebulization every 4 (four) hours as needed for wheezing or shortness of breath.  . ALPRAZolam  0.5 MG tablet Take 0.5 mg by mouth at bedtime as needed for anxiety.  . budesonide (PULMICORT) 0.25 MG/2ML neb soln Take 2 mLs (0.25 mg total) by nebulization daily.  [name] VITAMIN D 2000 UNITS CAPS Take 2,000-4,000 capsules by mouth every other day. Patient alternates 1 cap with 2 caps every other day.  . hyoscyamine0.125 MG tablet Take 1 to 2 tablets every 4 hours as needed for nausea, cramping, bloating or diarrhea  . levothyroxine 50 MCG tablet TAKE 1 TABLET BY MOUTH ONCE DAILY.  [name] lidocaine-prilocaine (EMLA) cream Apply 1 application topically as needed. Apply to [facility] one hour before access.  . Multiple Vitamin  w/ MINERALS Take 1 tablet by mouth daily.  [name] PROBIOTIC  Take 1 capsule by mouth daily.  . ranitidine  300 MG tablet Take 300 mg by mouth daily as needed for heartburn.   . rivaroxaban (XARELTO) 20 MG TABS tablet Take 1 tablet (20 mg total) by mouth daily with supper.  . vitamin B-12  1000 MCG tablet Take 1,000 mcg by mouth daily.   Allergies  Allergen Reactions  . Actonel [Risedronate Sodium] Other (See Comments)    "made me choke"   . Fosamax [Alendronate] Other (See Comments)    "made me choke"  . Sulfa Antibiotics Other (See Comments)    Doesn't remember   . Tetracyclines & Related Rash   Current Problems (verified) Patient Active Problem List   Diagnosis Date Noted  . Body mass index (BMI) of 20.0-20.9 in adult 07/21/2015  . Encounter for Medicare annual wellness exam 04/02/2015  . Leg DVT (deep venous thromboembolism), acute ([place])   . Normocytic anemia 01/04/2015  . Mantle cell lymphoma ([place]) 10/18/2014  . Essential tremor 07/15/2014  . Medication management 07/15/2014  . OSA (obstructive sleep apnea) 07/05/2014  . Anxiety state 02/27/2014  . Hyperlipidemia   . Hypertension   . GERD   . Osteoporosis, unspecified   . Osteoarthritis   .  Vitamin D deficiency   . Prediabetes   . IBS   . Diverticulosis   . Personal history of colonic polyps 07/19/2011  . Chronic obstructive airway disease with asthma ([place]) 03/16/2011   Screening Tests Health Maintenance  Topic Date Due  . TETANUS/TDAP  08/17/1962  . PNA vac Low Risk Adult (2 of 2 - PCV13) 06/18/2014  . INFLUENZA VACCINE  06/23/2016 (Originally 04/14/2015)  . MAMMOGRAM  09/11/2015  . COLONOSCOPY  08/18/2021  . DEXA SCAN  Completed  . ZOSTAVAX  Completed   Immunization History  Administered Date(s) Administered  . Pneumococcal Polysaccharide-23 06/18/2013  . Zoster 04/17/2013   Preventative care: Last colonoscopy: 08/19/2011  Past Medical History  Diagnosis Date  . OSA (obstructive sleep apnea)   . Hiatal  hernia   . IBS (irritable bowel syndrome)   . Fibrocystic breast disease   . Asthma   . Diverticulosis   . Adenomatous colon polyp 1994  . Internal hemorrhoids   . Melanoma ([place])     Facial  . Hyperlipidemia   . Hypertension   . GERD (gastroesophageal reflux disease)   . Osteoporosis   . Osteoarthritis   . Vitamin D deficiency   . Prediabetes   . Unspecified hypothyroidism   . Family history of ischemic heart disease   . Mantle cell lymphoma ([place]) 10/18/2014   Past Surgical History  Procedure Laterality Date  . Appendectomy    . Tonsillectomy    . Bunionectomy    . Knee arthroscopy    . Umbilical hernia repair     Risk Factors: Tobacco Social History  Substance Use Topics  . Smoking status: Never Smoker   . Smokeless tobacco: Never Used     Comment: Never Used Tobacco  . Alcohol Use: No   She does not smoke.  Patient is not a former smoker. Are there smokers in your home (other than you)?  No Alcohol Current alcohol use: none  Caffeine Current caffeine use: coffee 2 cups /day  Exercise Current exercise: housecleaning, walking and yard work  Nutrition/Diet Current diet: in general, a "healthy" diet    Cardiac risk factors: advanced age (older than 68 for men, 24 for women), dyslipidemia, hypertension and sedentary lifestyle.  Depression Screen (Note: if answer to either of the following is "Yes", a more complete depression screening is indicated)   Q1: Over the past two weeks, have you felt down, depressed or hopeless? No  Q2: Over the past two weeks, have you felt little interest or pleasure in doing things? No  Have you lost interest or pleasure in daily life? No  Do you often feel hopeless? No  Do you cry easily over simple problems? No  Activities of Daily Living In your present state of health, do you have any difficulty performing the following activities?:  Driving? No Managing money?  No Feeding yourself? No Getting from bed to chair?  No Climbing a flight of stairs? No Preparing food and eating?: No Bathing or showering? No Getting dressed: No Getting to the toilet? No Using the toilet:No Moving around from place to place: No In the past year have you fallen or had a near fall?:No   Are you sexually active?  Yes  Do you have more than one partner?  No  Vision Difficulties: No  Hearing Difficulties: No Do you often ask people to speak up or repeat themselves? No Do you experience ringing or noises in your ears? No Do you have difficulty understanding soft or whispered  voices? Sometimes.  Cognition  Do you feel that you have a problem with memory?No  Do you often misplace items? No  Do you feel safe at home?  Yes  Advanced directives Does patient have a [place]ie? Yes Does patient have a Living Will? Yes  ROS: Constitutional: Denies fever, chills, weight loss/gain, headaches, insomnia, fatigue, night sweats, and change in appetite. Eyes: Denies redness, blurred vision, diplopia, discharge, itchy, watery eyes.  ENT: Denies discharge, congestion, post nasal drip, epistaxis, sore throat, earache, hearing loss, dental pain, Tinnitus, Vertigo, Sinus pain, snoring.  Cardio: Denies chest pain, palpitations, irregular heartbeat, syncope, dyspnea, diaphoresis, orthopnea, PND, claudication, edema Respiratory: denies cough, dyspnea, DOE, pleurisy, hoarseness, laryngitis, wheezing.  Gastrointestinal: Denies dysphagia, heartburn, reflux, water brash, pain, cramps, nausea, vomiting, bloating, diarrhea, constipation, hematemesis, melena, hematochezia, jaundice, hemorrhoids Genitourinary: Denies dysuria, frequency, urgency, nocturia, hesitancy, discharge, hematuria, flank pain Breast: Breast lumps, nipple discharge, bleeding.  Musculoskeletal: Denies arthralgia, myalgia, stiffness, Jt. Swelling, pain, limp, and strain/sprain. Denies falls. Skin: Denies puritis, rash, hives, warts, acne, eczema, changing  in skin lesion Neuro: No weakness, tremor, incoordination, spasms, paresthesia, pain Psychiatric: Denies confusion, memory loss, sensory loss. Denies Depression. Endocrine: Denies change in weight, skin, hair change, nocturia, and paresthesia, diabetic polys, visual blurring, hyper / hypo glycemic episodes.  Heme/Lymph: No excessive bleeding, bruising, enlarged lymph nodes  Objective:     BP 136/70   Pulse 74  Temp 98.6 F   Resp 16  Ht 5' 3.5"   Wt 119 lb     BMI 20.75  General Appearance: Well nourished, alert, WD/WN, female and in no apparent distress. Prominent head titubation.  Eyes: PERRLA, EOMs, conjunctiva no swelling or erythema, normal fundi and vessels. Sinuses: No frontal/maxillary tenderness ENT/Mouth: EACs patent / TMs  nl. Nares clear without erythema, swelling, mucoid exudates. Oral hygiene is good. No erythema, swelling, or exudate. Tongue normal, non-obstructing. Tonsils not swollen or erythematous. Hearing normal.  Neck: Supple, thyroid normal. No bruits, nodes or JVD. Respiratory: Respiratory effort normal.  BS equal and clear bilateral without rales, rhonci, wheezing or stridor. Cardio: Heart sounds are normal with regular rate and rhythm and no murmurs, rubs or gallops. Peripheral pulses are normal and equal bilaterally without edema. No aortic or femoral bruits. Chest: symmetric with normal excursions and percussion. Breasts: Symmetric, without lumps, nipple discharge, retractions, or fibrocystic changes.  Abdomen: Flat, soft  with nl bowel sounds. Nontender, no guarding, rebound, hernias, masses, or organomegaly.  Lymphatics: Non tender without lymphadenopathy.  Musculoskeletal: Full ROM all peripheral extremities, joint stability, 5/5 strength, and normal gait. Skin: Warm and dry without rashes, lesions, cyanosis, clubbing or  ecchymosis.  Neuro: Cranial nerves intact, reflexes equal bilaterally. Normal muscle tone, no cerebellar symptoms. Sensation intact.   Pysch: Alert and oriented X 3, normal affect, Insight and Judgment appropriate.   Cognitive Testing  Alert? Yes  Normal Appearance?Yes  Oriented to person? Yes  Place? Yes   Time? Yes  Recall of three objects?  Yes  Can perform simple calculations? Yes  Displays appropriate judgment? Yes  Can read the correct time from a watch/clock?Yes  Medicare Attestation I have personally reviewed: The patient's medical and social history Their use of alcohol, tobacco or illicit drugs Their current medications and supplements The patient's functional ability including ADLs,fall risks, home safety risks, cognitive, and hearing and visual impairment Diet and physical activities Evidence for depression or mood disorders  The patient's weight, height, BMI, and visual acuity have been recorded in  the chart.  I have made referrals, counseling, and provided education to the patient based on review of the above and I have provided the patient with a written personalized care plan for preventive services.  Over 40 minutes of exam, counseling, chart review was performed.  Zyia Kaneko DAVID, MD   07/21/2015

## 2015-07-22 LAB — URINALYSIS, ROUTINE W REFLEX MICROSCOPIC
Bilirubin Urine: NEGATIVE
GLUCOSE, UA: NEGATIVE
Hgb urine dipstick: NEGATIVE
Ketones, ur: NEGATIVE
LEUKOCYTES UA: NEGATIVE
Nitrite: NEGATIVE
PH: 7 (ref 5.0–8.0)
Protein, ur: NEGATIVE
Specific Gravity, Urine: 1.006 (ref 1.001–1.035)

## 2015-07-22 LAB — MICROALBUMIN / CREATININE URINE RATIO
Creatinine, Urine: 35 mg/dL (ref 20–320)
Microalb Creat Ratio: 11 mcg/mg creat (ref <30)
Microalb, Ur: 0.4 mg/dL

## 2015-07-22 LAB — VITAMIN D 25 HYDROXY (VIT D DEFICIENCY, FRACTURES): Vit D, 25-Hydroxy: 41 ng/mL (ref 30–100)

## 2015-07-22 LAB — INSULIN, RANDOM: Insulin: 4.8 u[IU]/mL (ref 2.0–19.6)

## 2015-07-31 ENCOUNTER — Other Ambulatory Visit: Payer: Self-pay | Admitting: Nurse Practitioner

## 2015-07-31 DIAGNOSIS — C8310 Mantle cell lymphoma, unspecified site: Secondary | ICD-10-CM

## 2015-08-01 ENCOUNTER — Other Ambulatory Visit (HOSPITAL_BASED_OUTPATIENT_CLINIC_OR_DEPARTMENT_OTHER): Payer: Medicare Other

## 2015-08-01 ENCOUNTER — Encounter: Payer: Self-pay | Admitting: Family

## 2015-08-01 ENCOUNTER — Ambulatory Visit (HOSPITAL_BASED_OUTPATIENT_CLINIC_OR_DEPARTMENT_OTHER): Payer: Medicare Other | Admitting: Family

## 2015-08-01 VITALS — BP 114/77 | HR 72 | Temp 97.9°F | Resp 18 | Ht 63.0 in | Wt 118.0 lb

## 2015-08-01 DIAGNOSIS — C8313 Mantle cell lymphoma, intra-abdominal lymph nodes: Secondary | ICD-10-CM | POA: Diagnosis not present

## 2015-08-01 DIAGNOSIS — C8310 Mantle cell lymphoma, unspecified site: Secondary | ICD-10-CM

## 2015-08-01 LAB — COMPREHENSIVE METABOLIC PANEL (CC13)
ALBUMIN: 3.6 g/dL (ref 3.5–5.0)
ALK PHOS: 88 U/L (ref 40–150)
ALT: 12 U/L (ref 0–55)
AST: 23 U/L (ref 5–34)
Anion Gap: 7 mEq/L (ref 3–11)
BUN: 10.4 mg/dL (ref 7.0–26.0)
CO2: 28 mEq/L (ref 22–29)
CREATININE: 0.8 mg/dL (ref 0.6–1.1)
Calcium: 9.6 mg/dL (ref 8.4–10.4)
Chloride: 104 mEq/L (ref 98–109)
EGFR: 80 mL/min/{1.73_m2} — AB (ref >90)
GLUCOSE: 86 mg/dL (ref 70–140)
Potassium: 4 mEq/L (ref 3.5–5.1)
Sodium: 138 mEq/L (ref 136–145)
TOTAL PROTEIN: 6.1 g/dL — AB (ref 6.4–8.3)

## 2015-08-01 LAB — CBC WITH DIFFERENTIAL (CANCER CENTER ONLY)
BASO#: 0.1 10*3/uL (ref 0.0–0.2)
BASO%: 1.9 % (ref 0.0–2.0)
EOS%: 7.7 % — AB (ref 0.0–7.0)
Eosinophils Absolute: 0.3 10*3/uL (ref 0.0–0.5)
HEMATOCRIT: 36.8 % (ref 34.8–46.6)
HGB: 12 g/dL (ref 11.6–15.9)
LYMPH#: 0.4 10*3/uL — AB (ref 0.9–3.3)
LYMPH%: 11.3 % — AB (ref 14.0–48.0)
MCH: 30.5 pg (ref 26.0–34.0)
MCHC: 32.6 g/dL (ref 32.0–36.0)
MCV: 93 fL (ref 81–101)
MONO#: 0.5 10*3/uL (ref 0.1–0.9)
MONO%: 12.9 % (ref 0.0–13.0)
NEUT#: 2.4 10*3/uL (ref 1.5–6.5)
NEUT%: 66.2 % (ref 39.6–80.0)
PLATELETS: 180 10*3/uL (ref 145–400)
RBC: 3.94 10*6/uL (ref 3.70–5.32)
RDW: 12.9 % (ref 11.1–15.7)
WBC: 3.6 10*3/uL — AB (ref 3.9–10.0)

## 2015-08-01 LAB — LACTATE DEHYDROGENASE (CC13): LDH: 189 U/L (ref 125–245)

## 2015-08-01 LAB — TECHNOLOGIST REVIEW CHCC SATELLITE

## 2015-08-01 NOTE — Progress Notes (Signed)
Hematology and Oncology Follow Up Visit  Shannon Parsons [id number] Mar 21, 1941 74 y.o. 08/01/2015   Principle Diagnosis:  Mantle cell lymphoma (MCIPI = 5) DVT of the right leg - resolved   Current Therapy:   Patient s/p cycle 1 of maintenance Rituxan Xarelto 20 mg daily - discontinued at today's visit    Interim History:  Shannon Parsons is here today for a follow-up. She did well receiving her maintenance Rituxan last week. She did not experience any side effects.  She has had improvement in her energy level.  She is here today with c.o blood in her stool yesterday. She states that she did strain while having a bowel movement and that there was bright red blood on her toilet tissue.  She is currently on Xarelto and has now completed 7 months. She has no swelling tenderness, numbness or tingling in her extremities.  She is sleeping better at night and has not had to use the Restoril.  She denies fever, chills, n/v, cough, dizziness, headache, blurred vision, headache, SOB, mouth sores, chest pain, palpitations, abdominal pain or changes in bowel or bladder habits.   Her appetite is good and she is staying well hydrated. Her weight is unchanged.    Medications:    Medication List       This list is accurate as of: 08/01/15 12:53 PM.  Always use your most recent med list.               acetaminophen 325 MG tablet  Commonly known as:  TYLENOL  Take 325 mg by mouth every 6 (six) hours as needed (For pain.).     albuterol 108 (90 BASE) MCG/ACT inhaler  Commonly known as:  PROVENTIL HFA;VENTOLIN HFA  Inhale 2 puffs into the lungs every 6 (six) hours as needed for wheezing or shortness of breath (cough).     albuterol (2.5 MG/3ML) 0.083% nebulizer solution  Commonly known as:  PROVENTIL  Take 3 mLs (2.5 mg total) by nebulization every 4 (four) hours as needed for wheezing or shortness of breath.     ALPRAZolam 0.5 MG tablet  Commonly known as:  XANAX  Take 0.5 mg by mouth at  bedtime as needed for anxiety.     budesonide 0.25 MG/2ML nebulizer solution  Commonly known as:  PULMICORT  Take 2 mLs (0.25 mg total) by nebulization daily.     hyoscyamine 0.125 MG tablet  Commonly known as:  LEVSIN, ANASPAZ  Take 1 to 2 tablets every 4 hours as needed for nausea, cramping, bloating or diarrhea     levothyroxine 50 MCG tablet  Commonly known as:  SYNTHROID, LEVOTHROID  TAKE 1 TABLET BY MOUTH ONCE DAILY.     lidocaine-prilocaine cream  Commonly known as:  EMLA  Apply 1 application topically as needed. Apply to [facility] one hour before access.     multivitamin with minerals Tabs tablet  Take 1 tablet by mouth daily.     PROBIOTIC DAILY PO  Take 1 capsule by mouth daily.     vitamin B-12 1000 MCG tablet  Commonly known as:  CYANOCOBALAMIN  Take 1,000 mcg by mouth daily.     Vitamin D 2000 UNITS Caps  Take 2,000-4,000 capsules by mouth every other day. Patient alternates 1 cap with 2 caps every other day.        Allergies:  Allergies  Allergen Reactions  . Actonel [Risedronate Sodium] Other (See Comments)    "made me choke"   . Fosamax [Alendronate] Other (  See Comments)    "made me choke"  . Sulfa Antibiotics Other (See Comments)    Doesn't remember   . Tetracyclines & Related Rash    Past Medical History, Surgical history, Social history, and Family History were reviewed and updated.  Review of Systems: All other 10 point review of systems is negative.   Physical Exam:  height is 5\' 3"  (1.6 m) and weight is 118 lb (53.524 kg). Her oral temperature is 97.9 F (36.6 C). Her blood pressure is 114/77 and her pulse is 72. Her respiration is 18.   Wt Readings from Last 3 Encounters:  08/01/15 118 lb (53.524 kg)  07/21/15 119 lb (53.978 kg)  07/17/15 110 lb 8 oz (50.122 kg)    Ocular: Sclerae unicteric, pupils equal, round and reactive to light Ear-nose-throat: Oropharynx clear, dentition fair Lymphatic: No cervical supraclavicular or axillary  adenopathy Lungs no rales or rhonchi, good excursion bilaterally Heart regular rate and rhythm, no murmur appreciated Abd soft, nontender, positive bowel sounds MSK no focal spinal tenderness, no joint edema Neuro: non-focal, well-oriented, appropriate affect Breasts: Deferred  Lab Results  Component Value Date   WBC 3.6* 08/01/2015   HGB 12.0 08/01/2015   HCT 36.8 08/01/2015   MCV 93 08/01/2015   PLT 180 08/01/2015   Lab Results  Component Value Date   FERRITIN 118 04/02/2015   IRON 63 04/02/2015   TIBC 300 04/02/2015   UIBC 237 04/02/2015   IRONPCTSAT 21 04/02/2015   Lab Results  Component Value Date   RBC 3.94 08/01/2015   No results found for: Nils Pyle [facility] Lab Results  Component Value Date   IGA 137 09/10/2014   No results found for: Kathrynn Ducking, MSPIKE, SPEI   Chemistry      Component Value Date/Time   NA 136 06/24/2015 0957   NA 137 04/23/2015 0803   NA 135 04/02/2015 1511   K 3.9 06/24/2015 0957   K 3.4 04/23/2015 0803   K 3.7 04/02/2015 1511   CL 107 04/23/2015 0803   CL 102 04/02/2015 1511   CO2 25 06/24/2015 0957   CO2 24 04/23/2015 0803   CO2 22 04/02/2015 1511   BUN 10.0 06/24/2015 0957   BUN 16 04/23/2015 0803   BUN 16 04/02/2015 1511   CREATININE 0.7 06/24/2015 0957   CREATININE 1.0 04/23/2015 0803   CREATININE 0.64 02/22/2015 0405      Component Value Date/Time   CALCIUM 9.4 06/24/2015 0957   CALCIUM 9.9 04/23/2015 0803   CALCIUM 8.9 04/02/2015 1511   ALKPHOS 77 06/24/2015 0957   ALKPHOS 48 04/23/2015 0803   ALKPHOS 48 04/02/2015 1511   AST 27 06/24/2015 0957   AST 29 04/23/2015 0803   AST 19 04/02/2015 1511   ALT 17 06/24/2015 0957   ALT 25 04/23/2015 0803   ALT 11 04/02/2015 1511   BILITOT 0.54 06/24/2015 0957   BILITOT 0.70 04/23/2015 0803   BILITOT 0.3 04/02/2015 1511     Impression and Plan: Shannon Parsons is 74 year old white female with mantle cell  lymphoma. She did well with her maintenance 2 weeks ago. She had no adverse effects.  She is here today with c/o blood in her stool yesterday. She did states that she strained while having a BM and has no other episodes of bleeding. Her Hgb today is 12.0 with an MCV of 93.  We wanted her to complete 6 months of anticoagulation with Xarelto and we are now at  7 months.  She will discontinue the Xarelto at this time and start taking 2 baby aspirin daily.  She is in agreement with this. We will plan to see her back on January 10th for follow-up and treatment. She will notify us if she notices any more blood in her stool.  She will contact us with any questions or concerns. We can certainly see her sooner if need be.   Eliezer Bottom, NP 11/18/201612:53 PM

## 2015-08-18 ENCOUNTER — Ambulatory Visit (INDEPENDENT_AMBULATORY_CARE_PROVIDER_SITE_OTHER): Payer: Medicare Other | Admitting: Physician Assistant

## 2015-08-18 ENCOUNTER — Encounter: Payer: Self-pay | Admitting: Physician Assistant

## 2015-08-18 ENCOUNTER — Other Ambulatory Visit: Payer: Self-pay | Admitting: Hematology & Oncology

## 2015-08-18 ENCOUNTER — Ambulatory Visit (HOSPITAL_COMMUNITY)
Admission: RE | Admit: 2015-08-18 | Discharge: 2015-08-18 | Disposition: A | Discharge status: Home / Self Care | Payer: Medicare Other | Source: Ambulatory Visit | Attending: Physician Assistant | Admitting: Physician Assistant

## 2015-08-18 VITALS — BP 118/66 | HR 72 | Temp 97.9°F | Resp 14 | Ht 63.0 in | Wt 118.0 lb

## 2015-08-18 DIAGNOSIS — M5442 Lumbago with sciatica, left side: Secondary | ICD-10-CM | POA: Diagnosis not present

## 2015-08-18 DIAGNOSIS — M8580 Other specified disorders of bone density and structure, unspecified site: Secondary | ICD-10-CM | POA: Insufficient documentation

## 2015-08-18 DIAGNOSIS — M419 Scoliosis, unspecified: Secondary | ICD-10-CM | POA: Insufficient documentation

## 2015-08-18 DIAGNOSIS — M545 Low back pain: Secondary | ICD-10-CM | POA: Diagnosis present

## 2015-08-18 MED ORDER — MELOXICAM 15 MG PO TABS: ORAL_TABLET | ORAL | Status: DC

## 2015-08-18 NOTE — Progress Notes (Signed)
Subjective:    Patient ID: Shannon Parsons, female    DOB: 1941/08/27, 74 y.o.   MRN: [id number]  HPI 74 y.o. WF with history of HTN, GERD, preDM, diverticulosis, anxiety, chol and vitamin D has finished chemotherapy for Mantle cell lymphoma at cancer center with Dr.Ennever, has follow up in Jan, had recent normal PET on 06/11/2015 presents for back pain x 10 days. She states that due to her chemotherapy she has been very sedentary however the 2 days after thanksgiving she walked for 30 mins each day and then Saturday she started to have pain. She states it is left lower back with pain radiating down her front lower leg to her foot with tingling and numbness. She denies weakness in that leg, falling, denies urinary issues, fever, chills, saddle anesthesia. She has history of back pain but has not lasted over 2-3 days.    Blood pressure 118/66, pulse 72, temperature 97.9 F (36.6 C), temperature source Temporal, resp. rate 14, height 5\' 3"  (1.6 m), weight 118 lb (53.524 kg), last menstrual period 09/14/2014, SpO2 94 %.  Past Medical History  Diagnosis Date  . OSA (obstructive sleep apnea)   . Hiatal hernia   . IBS (irritable bowel syndrome)   . Fibrocystic breast disease   . Asthma   . Diverticulosis   . Adenomatous colon polyp 1994  . Internal hemorrhoids   . Melanoma ([place])     Facial  . Hyperlipidemia   . Hypertension   . GERD (gastroesophageal reflux disease)   . Osteoporosis   . Osteoarthritis   . Vitamin D deficiency   . Prediabetes   . Unspecified hypothyroidism   . Family history of ischemic heart disease   . Mantle cell lymphoma ([place]) 10/18/2014   Current Outpatient Prescriptions on File Prior to Visit  Medication Sig Dispense Refill  . acetaminophen (TYLENOL) 325 MG tablet Take 325 mg by mouth every 6 (six) hours as needed (For pain.).     [name] albuterol (PROVENTIL HFA;VENTOLIN HFA) 108 (90 BASE) MCG/ACT inhaler Inhale 2 puffs into the lungs every 6 (six) hours as needed for  wheezing or shortness of breath (cough). 1 Inhaler 0  . albuterol (PROVENTIL) (2.5 MG/3ML) 0.083% nebulizer solution Take 3 mLs (2.5 mg total) by nebulization every 4 (four) hours as needed for wheezing or shortness of breath. 30 vial 0  . ALPRAZolam (XANAX) 0.5 MG tablet Take 0.5 mg by mouth at bedtime as needed for anxiety.    . Cholecalciferol (VITAMIN D) 2000 UNITS CAPS Take 2,000-4,000 capsules by mouth every other day. Patient alternates 1 cap with 2 caps every other day.    . hyoscyamine (LEVSIN, ANASPAZ) 0.125 MG tablet Take 1 to 2 tablets every 4 hours as needed for nausea, cramping, bloating or diarrhea 90 tablet 11  . levothyroxine (SYNTHROID, LEVOTHROID) 50 MCG tablet TAKE 1 TABLET BY MOUTH ONCE DAILY. 90 tablet 3  . lidocaine-prilocaine (EMLA) cream Apply 1 application topically as needed. Apply to [facility] one hour before access. 30 g 3  . Multiple Vitamin (MULTIVITAMIN WITH MINERALS) TABS tablet Take 1 tablet by mouth daily.    . Probiotic Product (PROBIOTIC DAILY PO) Take 1 capsule by mouth daily.    . vitamin B-12 (CYANOCOBALAMIN) 1000 MCG tablet Take 1,000 mcg by mouth daily.     No current facility-administered medications on file prior to visit.    Review of Systems  Constitutional: Negative for fever, chills and fatigue.  Gastrointestinal: Negative for nausea and vomiting.  Genitourinary: Negative for dysuria, urgency, frequency, hematuria, flank pain and difficulty urinating.  Musculoskeletal: Positive for back pain.       Objective:   Physical Exam  Constitutional: She is oriented to person, place, and time. She appears well-developed and well-nourished.  HENT:  Head: Normocephalic and atraumatic.  Eyes: Conjunctivae are normal. Pupils are equal, round, and reactive to light.  Neck: Normal range of motion. Neck supple.  Cardiovascular: Normal rate and regular rhythm.   Pulmonary/Chest: Effort normal and breath sounds normal.  Abdominal: Soft. Bowel sounds are normal.  There is no tenderness.  Musculoskeletal:  Patient is able to ambulate well. Gait is  Antalgic. Straight leg raising with dorsiflexion positive on the left side. Sensory exam in the legs are abnormal bilateral bottom feet. Knee reflexes are normal Ankle reflexes are normal Strength is normal and symmetric in arms and legs. There is not SI tenderness to palpation.  There is paraspinal muscle spasm on the left.  There is midline tenderness.  ROM of spine with  limited in all spheres due to pain.   Lymphadenopathy:    She has no cervical adenopathy.  Neurological: She is alert and oriented to person, place, and time. She has normal reflexes.  Skin: Skin is warm and dry. No rash noted.  No warmth, swelling, tenderness bilateral calves       Assessment & Plan:  Lower back pain- positive straight leg with radicular symtoms NSAIDs, RICE, and exercise given, Xray Wants to do PT before seeing ortho, has seen murphy/wainer in the past.

## 2015-08-18 NOTE — Patient Instructions (Addendum)

## 2015-08-28 ENCOUNTER — Other Ambulatory Visit: Payer: Self-pay | Admitting: Nurse Practitioner

## 2015-08-28 ENCOUNTER — Ambulatory Visit: Payer: Medicare Other | Attending: Orthopaedic Surgery | Admitting: Physical Therapy

## 2015-08-28 DIAGNOSIS — M6289 Other specified disorders of muscle: Secondary | ICD-10-CM | POA: Insufficient documentation

## 2015-08-28 DIAGNOSIS — M62830 Muscle spasm of back: Secondary | ICD-10-CM | POA: Diagnosis present

## 2015-08-28 DIAGNOSIS — R293 Abnormal posture: Secondary | ICD-10-CM | POA: Insufficient documentation

## 2015-08-28 DIAGNOSIS — M545 Low back pain, unspecified: Secondary | ICD-10-CM

## 2015-08-28 DIAGNOSIS — R29898 Other symptoms and signs involving the musculoskeletal system: Secondary | ICD-10-CM

## 2015-08-28 NOTE — Therapy (Signed)
[place], [place], [address] Phone: [phone]   Fax:  [phone]  Physical Therapy Evaluation  Patient Details  Name: Shannon Parsons MRN: [id number] Date of Birth: [birth year] Referring Provider: Karenann Cai PA-C  Encounter Date: 08/28/2015      PT End of Session - 08/28/15 1458    Visit Number 1   Number of Visits 12   Date for PT Re-Evaluation 10/09/15   Authorization Type Medicare: kx mod by 15th visit, and progress note by 9th visit   PT Start Time 1415   PT Stop Time 1459   PT Time Calculation (min) 44 min   Activity Tolerance Patient tolerated treatment well   Behavior During Therapy [facility] for tasks assessed/performed      Past Medical History  Diagnosis Date  . OSA (obstructive sleep apnea)   . Hiatal hernia   . IBS (irritable bowel syndrome)   . Fibrocystic breast disease   . Asthma   . Diverticulosis   . Adenomatous colon polyp 1994  . Internal hemorrhoids   . Melanoma ([place])     Facial  . Hyperlipidemia   . Hypertension   . GERD (gastroesophageal reflux disease)   . Osteoporosis   . Osteoarthritis   . Vitamin D deficiency   . Prediabetes   . Unspecified hypothyroidism   . Family history of ischemic heart disease   . Mantle cell lymphoma ([place]) 10/18/2014    Past Surgical History  Procedure Laterality Date  . Appendectomy    . Tonsillectomy    . Bunionectomy    . Knee arthroscopy    . Umbilical hernia repair      There were no vitals filed for this visit.  Visit Diagnosis:  Bilateral low back pain without sciatica - Plan: PT plan of care cert/re-cert  Abnormal posture - Plan: PT plan of care cert/re-cert  Back muscle spasm - Plan: PT plan of care cert/re-cert  Weakness of both hips - Plan: PT plan of care cert/re-cert      Subjective Assessment - 08/28/15 1415    Subjective pt is a 74 y.o F with C Cof low back pain that has been going on for the last 2 weeks,  she denies any traumtic injury. She reports tha she is feeling better after making some medicaton. she reported pain down the legs with L > R , she denies incointince problems or saddle parestheisa.    Pertinent History taking medication for cancer   Limitations Lifting   How long can you sit comfortably? 1 hour   How long can you stand comfortably? 30-45 min   How long can you walk comfortably? 30- 45 min   Diagnostic tests 08/27/2015 Scoliosis and arthritis in the low back    Patient Stated Goals I want to feel stronger to avoid the back from getting worse   Currently in Pain? Yes   Pain Score 0-No pain  with moving around 1-2/10, prior to seeing MD 9/10   Pain Location Back   Pain Orientation Lower   Pain Type Chronic pain   Pain Radiating Towards tingling into bil knees with L>R   Pain Onset More than a month ago   Pain Frequency Occasional   Aggravating Factors  bending, picking up items,    Pain Relieving Factors Medication, heat, ice            [facility] PT Assessment - 08/28/15 1408    Assessment   Medical Diagnosis low  back pain   Referring Provider Colleen Mahar PA-C   Onset Date/Surgical Date 08/09/15   Hand Dominance Right   Next MD Visit 6 weeks    Prior Therapy yes, but pt can't remember what body part   Precautions   Precautions None   Restrictions   Weight Bearing Restrictions No   Balance Screen   Has the patient fallen in the past 6 months No   Has the patient had a decrease in activity level because of a fear of falling?  No   Is the patient reluctant to leave their home because of a fear of falling?  No   Home Environment   Living Environment Private residence   Living Arrangements Spouse/significant other   Available Help at Discharge Available 24 hours/day;Available PRN/intermittently   Type of Home House   Home Access Stairs to enter   Entrance Stairs-Number of Steps 3   Entrance Stairs-Rails None  holds onto the wall   Home Layout Two level    Alternate Level Stairs-Number of Steps 14   Alternate Level Stairs-Rails Right   Prior Function   Level of Independence Independent;Independent with basic ADLs   Vocation Retired   Leisure living, reading, resting, listening to music,    Cognition   Overall Cognitive Status Within Functional Limits for tasks assessed   Observation/Other Assessments   Focus on Therapeutic Outcomes (FOTO)  39% limited  predicted 29% limited   Posture/Postural Control   Posture/Postural Control Postural limitations   ROM / Strength   AROM / PROM / Strength AROM;PROM;Strength   AROM   AROM Assessment Site Lumbar   Lumbar Flexion 70   Lumbar Extension 20   Lumbar - Right Side Bend 38   Lumbar - Left Side Bend 28  tightness at endrange   Strength   Strength Assessment Site Hip;Knee   Right/Left Hip Right;Left   Right Hip Flexion 4/5   Right Hip ABduction 4-/5   Right Hip ADduction 4/5   Left Hip Flexion 4-/5   Left Hip ABduction 4-/5   Left Hip ADduction 4/5   Right/Left Knee Right;Left   Right Knee Flexion 5/5   Right Knee Extension 5/5   Left Knee Flexion 5/5   Left Knee Extension 5/5   Palpation   Palpation comment tenderness located    Special Tests    Special Tests Lumbar   Lumbar Tests Slump Test;Prone Knee Bend Test;Straight Leg Raise   Sacroiliac Tests  Sacral Thrust   Slump test   Findings Negative   Side --  bil   Prone Knee Bend Test   Findings Positive   Side --  bil   Straight Leg Raise   Findings Negative   Side  --  bil                           PT Education - 08/28/15 1458    Education provided Yes   Education Details evaluation findings, POC, goals, HEP   Person(s) Educated Patient   Methods Explanation   Comprehension Verbalized understanding          PT Short Term Goals - 08/28/15 1517    PT SHORT TERM GOAL #1   Title pt will be I with initial HEP (09/17/2014)   Time 3   Period Weeks   Status New   PT SHORT TERM GOAL #2   Title  pt will be able to verbalize and demonstrate techniques to reduce low  back pain and reinjury via posture awareness, lifting and carrying mechanics, and HEP (09/17/2014)   Time 3   Period Weeks   Status New           PT Long Term Goals - 27-Sep-2015 1536    PT LONG TERM GOAL #1   Title pt will be I with all HEP as of last visiti (10/09/2015)   Time 6   Period Weeks   Status New   PT LONG TERM GOAL #2   Title pt will demonstrate function trunk mobility with </=2/10 pain to promote funcitonal progression (10/09/2015)   Time 6   Period Weeks   Status New   PT LONG TERM GOAL #3   Title pt will be able to lift and carry >/= 8 for 50 ft with </=2/10 pain while exhibiting proper mechanics to promote lifting and carrying activities (10/09/2015)   Time 6   Period Weeks   Status New   PT LONG TERM GOAL #4   Title she will increase her FOTO score >/= 70 to demonstrate improved function at discharge (10/09/2015)   Time [place] - 09/27/15 1459    Clinical Impression Statement Canya reports to OPPT with CC of low back pain that has been present for 2 weeks. She demosntrates functionl trunk mobility in all planes with soreness with L sidebending. She exhibits some weakness of the hip flexors and abductors. passive intervertebral movements of L1-L5 are limited with soreness noted around L3-L2, she exhiibits L sided thoracic scoliosis with moderate thoracic kyphosis. Palpation revealed tightness of the paraspinals with R>L. Special testing was postive for lumbar spine indicating muscle tightss, and SIJ testing indiccating sligth anterior rotation of the R innominate. She would benefit from physical therapy to decrease low back pain and return to PLOF by addressing the impairments listed.    Pt will benefit from skilled therapeutic intervention in order to improve on the following deficits Improper body mechanics;Postural dysfunction;Pain;Decreased  endurance;Increased muscle spasms;Hypomobility;Decreased activity tolerance   Rehab Potential Good   PT Frequency 2x / week   PT Duration 6 weeks   PT Treatment/Interventions ADLs/Self Care Home Management;Cryotherapy;Iontophoresis 4mg /ml Dexamethasone;Therapeutic exercise;Therapeutic activities;Manual techniques;Taping;Dry needling;Passive range of motion;Patient/family education   PT Next Visit Plan assess response to HEP, trunk moblity, manual for hip muscle tightness, ICE only (taking medication for cancer). posture education.    PT Home Exercise Plan lower trunk rotation, pelvic titls, hamstirng stretch, hip flexor stretching, piriformis stretch   Consulted and Agree with Plan of Care Patient          G-Codes - September 27, 2015 1538    Functional Assessment Tool Used FOTO 39% limited   Functional Limitation Changing and maintaining body position   Changing and Maintaining Body Position Current Status [id number]) At least 20 percent but less than 40 percent impaired, limited or restricted   Changing and Maintaining Body Position Goal Status [id number]) At least 20 percent but less than 40 percent impaired, limited or restricted       Problem List Patient Active Problem List   Diagnosis Date Noted  . Body mass index (BMI) of 20.0-20.9 in adult 07/21/2015  . Encounter for Medicare annual wellness exam 04/02/2015  . Leg DVT (deep venous thromboembolism), acute ([place])   . Normocytic anemia 01/04/2015  . Mantle cell lymphoma ([place]) 10/18/2014  . Essential tremor 07/15/2014  . Medication management 07/15/2014  .  OSA (obstructive sleep apnea) 07/05/2014  . Anxiety state 02/27/2014  . Hyperlipidemia   . Hypertension   . GERD   . Osteoporosis, unspecified   . Osteoarthritis   . Vitamin D deficiency   . Prediabetes   . IBS   . Diverticulosis   . Personal history of colonic polyps 07/19/2011  . Chronic obstructive airway disease with asthma ([place]) 03/16/2011   Starr Lake PT, DPT, LAT, ATC   08/28/2015  4:03 PM     [facility] [address] [place], [place], [address] Phone: [phone]   Fax:  [phone]  Name: Shannon Parsons MRN: [id number] Date of Birth: July 21, 1941

## 2015-08-28 NOTE — Patient Instructions (Signed)
   Su Duma PT, DPT, LAT, ATC  [facility] Phone: [phone]     

## 2015-09-10 ENCOUNTER — Ambulatory Visit: Payer: Medicare Other | Admitting: Physical Therapy

## 2015-09-10 DIAGNOSIS — M545 Low back pain, unspecified: Secondary | ICD-10-CM

## 2015-09-10 DIAGNOSIS — R29898 Other symptoms and signs involving the musculoskeletal system: Secondary | ICD-10-CM

## 2015-09-10 DIAGNOSIS — M62830 Muscle spasm of back: Secondary | ICD-10-CM

## 2015-09-10 DIAGNOSIS — R293 Abnormal posture: Secondary | ICD-10-CM

## 2015-09-10 NOTE — Therapy (Signed)
[place], [place], [address] Phone: [phone]   Fax:  [phone]  Physical Therapy Treatment  Patient Details  Name: Shannon Parsons MRN: [id number] Date of Birth: [birth year] Referring Provider: Karenann Cai PA-C  Encounter Date: 09/10/2015      PT End of Session - 09/10/15 1511    Visit Number 2   Number of Visits 12   Date for PT Re-Evaluation 10/09/15   PT Start Time 1325   PT Stop Time 1414   PT Time Calculation (min) 49 min   Activity Tolerance Patient tolerated treatment well;No increased pain   Behavior During Therapy [facility] for tasks assessed/performed      Past Medical History  Diagnosis Date  . OSA (obstructive sleep apnea)   . Hiatal hernia   . IBS (irritable bowel syndrome)   . Fibrocystic breast disease   . Asthma   . Diverticulosis   . Adenomatous colon polyp 1994  . Internal hemorrhoids   . Melanoma ([place])     Facial  . Hyperlipidemia   . Hypertension   . GERD (gastroesophageal reflux disease)   . Osteoporosis   . Osteoarthritis   . Vitamin D deficiency   . Prediabetes   . Unspecified hypothyroidism   . Family history of ischemic heart disease   . Mantle cell lymphoma ([place]) 10/18/2014    Past Surgical History  Procedure Laterality Date  . Appendectomy    . Tonsillectomy    . Bunionectomy    . Knee arthroscopy    . Umbilical hernia repair      There were no vitals filed for this visit.  Visit Diagnosis:  Bilateral low back pain without sciatica  Abnormal posture  Back muscle spasm  Weakness of both hips      Subjective Assessment - 09/10/15 1325    Subjective No pain now.  Has a few questions about the exercises.     Currently in Pain? No/denies   Pain Location Back   Pain Orientation Lower   Pain Frequency Occasional   Aggravating Factors  bending                         [facility] Adult PT Treatment/Exercise - 09/10/15 1340    Lumbar  Exercises: Stretches   Piriformis Stretch 20 seconds;30 seconds  anterior hip stretch practiced.  Cues to bring knee to chest   Piriformis Stretch Limitations stiffer LT,  showed knee to opposite shoulder as alternative.   Lumbar Exercises: Supine   Other Supine Lumbar Exercises decompression series.  all practiced 3 to 5 X each except for decompression  which was 5 minutes.  Nec roll uses and 1 pillow under legs for leg press   Other Supine Lumbar Exercises Supine scapular stabilization series with yellow band 10 X each added to home exercise program:  Sash, chest pull, ER narrow grip flexion                PT Education - 09/10/15 1510    Education provided Yes   Education Details decompression Exercises from drawer, supine scapular stabilization   Person(s) Educated Patient   Methods Explanation;Tactile cues;Verbal cues;Handout   Comprehension Verbalized understanding;Returned demonstration          PT Short Term Goals - 09/10/15 1514    PT SHORT TERM GOAL #1   Title pt will be I with initial HEP (09/17/2014)   Baseline minor cues   Time 3  Period Weeks   Status On-going   PT SHORT TERM GOAL #2   Title pt will be able to verbalize and demonstrate techniques to reduce low back pain and reinjury via posture awareness, lifting and carrying mechanics, and HEP (09/17/2014)   Baseline golfer's lift uses often   Time 3   Period Weeks   Status Partially Met           PT Long Term Goals - 08/28/15 1536    PT LONG TERM GOAL #1   Title pt will be I with all HEP as of last visiti (10/09/2015)   Time 6   Period Weeks   Status New   PT LONG TERM GOAL #2   Title pt will demonstrate function trunk mobility with </=2/10 pain to promote funcitonal progression (10/09/2015)   Time 6   Period Weeks   Status New   PT LONG TERM GOAL #3   Title pt will be able to lift and carry >/= 8 for 50 ft with </=2/10 pain while exhibiting proper mechanics to promote lifting and carrying  activities (10/09/2015)   Time 6   Period Weeks   Status New   PT LONG TERM GOAL #4   Title she will increase her FOTO score >/= 70 to demonstrate improved function at discharge (10/09/2015)   Time 6   Period Weeks   Status New               Plan - 09/10/15 1512    Clinical Impression Statement Progress toward home exercise goals.  Minor cues needed.  No pain, intermittant discomfort during exercises, eliminated with modifications. Briefly lightheaded upon sitting.    PT Next Visit Plan review decompression and supine band exercises   PT Home Exercise Plan decompression, supine scapular stabilization, yellow band   Consulted and Agree with Plan of Care Patient        Problem List Patient Active Problem List   Diagnosis Date Noted  . Body mass index (BMI) of 20.0-20.9 in adult 07/21/2015  . Encounter for Medicare annual wellness exam 04/02/2015  . Leg DVT (deep venous thromboembolism), acute ([place])   . Normocytic anemia 01/04/2015  . Mantle cell lymphoma ([place]) 10/18/2014  . Essential tremor 07/15/2014  . Medication management 07/15/2014  . OSA (obstructive sleep apnea) 07/05/2014  . Anxiety state 02/27/2014  . Hyperlipidemia   . Hypertension   . GERD   . Osteoporosis, unspecified   . Osteoarthritis   . Vitamin D deficiency   . Prediabetes   . IBS   . Diverticulosis   . Personal history of colonic polyps 07/19/2011  . Chronic obstructive airway disease with asthma ([place]) 03/16/2011    Shannon Parsons 09/10/2015, 3:15 PM  [facility] [address] [place], [place], [address] Phone: [phone]   Fax:  [phone]  Name: Shannon Parsons MRN: [id number] Date of Birth: Sep 30, 1940    Melvenia Needles, PTA 09/10/2015 3:15 PM Phone: [phone] Fax: [phone]

## 2015-09-10 NOTE — Patient Instructions (Signed)
Over Head Pull: Narrow Grip       On back, knees bent, feet flat, band across thighs, elbows straight but relaxed. Pull hands apart (start). Keeping elbows straight, bring arms up and over head, hands toward floor. Keep pull steady on band. Hold momentarily. Return slowly, keeping pull steady, back to start. Repeat 10-30___ times. Band color _yellow_____   Side Pull: Double Arm   On back, knees bent, feet flat. Arms perpendicular to body, shoulder level, elbows straight but relaxed. Pull arms out to sides, elbows straight. Resistance band comes across collarbones, hands toward floor. Hold momentarily. Slowly return to starting position. Repeat 10-30__ times. Band color _yellow____   Sash   On back, knees bent, feet flat, left hand on left hip, right hand above left. Pull right arm DIAGONALLY (hip to shoulder) across chest. Bring right arm along head toward floor. Hold momentarily. Slowly return to starting position. Repeat10-30 ___ times. Do with left arm. Band color _yellow_____   Shoulder Rotation: Double Arm   On back, knees bent, feet flat, elbows tucked at sides, bent 90, hands palms up. Pull hands apart and down toward floor, keeping elbows near sides. Hold momentarily. Slowly return to starting position. Repeat 10-30___ times. Band color ___yellow___    

## 2015-09-16 ENCOUNTER — Ambulatory Visit: Payer: Medicare Other | Attending: Orthopaedic Surgery | Admitting: Physical Therapy

## 2015-09-16 DIAGNOSIS — M62830 Muscle spasm of back: Secondary | ICD-10-CM | POA: Diagnosis present

## 2015-09-16 DIAGNOSIS — M545 Low back pain, unspecified: Secondary | ICD-10-CM

## 2015-09-16 DIAGNOSIS — M6289 Other specified disorders of muscle: Secondary | ICD-10-CM | POA: Insufficient documentation

## 2015-09-16 DIAGNOSIS — R29898 Other symptoms and signs involving the musculoskeletal system: Secondary | ICD-10-CM

## 2015-09-16 DIAGNOSIS — R293 Abnormal posture: Secondary | ICD-10-CM | POA: Insufficient documentation

## 2015-09-16 NOTE — Patient Instructions (Signed)
(  Home) Retraction: Row - Bilateral (Anchor)    Facing anchor, arms reaching forward, pull hands toward stomach, pinching shoulder blades together. Repeat __10__ times per set. Do ___1_ sets per session. Do __3-4__ sessions per week.  May do single arm, May do with elbows straight.   Use _red band.  Copyright  VHI. All rights reserved.

## 2015-09-16 NOTE — Therapy (Signed)
Shannon Parsons, [place], [address] Phone: [phone]   Fax:  [phone]  Physical Therapy Treatment  Patient Details  Name: Shannon Parsons MRN: [id number] Date of Birth: 1941-07-02 Referring Provider: Karenann Cai PA-C  Encounter Date: 09/16/2015      PT End of Session - 09/16/15 1509    Visit Number 3   Number of Visits 12   Date for PT Re-Evaluation 10/09/15   PT Start Time [id number]   PT Stop Time 1502   PT Time Calculation (min) 45 min   Activity Tolerance Patient tolerated treatment well;No increased pain   Behavior During Therapy [facility] for tasks assessed/performed      Past Medical History  Diagnosis Date  . OSA (obstructive sleep apnea)   . Hiatal hernia   . IBS (irritable bowel syndrome)   . Fibrocystic breast disease   . Asthma   . Diverticulosis   . Adenomatous colon polyp 1994  . Internal hemorrhoids   . Melanoma ([place])     Facial  . Hyperlipidemia   . Hypertension   . GERD (gastroesophageal reflux disease)   . Osteoporosis   . Osteoarthritis   . Vitamin D deficiency   . Prediabetes   . Unspecified hypothyroidism   . Family history of ischemic heart disease   . Mantle cell lymphoma ([place]) 10/18/2014    Past Surgical History  Procedure Laterality Date  . Appendectomy    . Tonsillectomy    . Bunionectomy    . Knee arthroscopy    . Umbilical hernia repair      There were no vitals filed for this visit.  Visit Diagnosis:  Bilateral low back pain without sciatica  Back muscle spasm  Weakness of both hips      Subjective Assessment - 09/16/15 1424    Subjective upper trap RT a little stiff, sore.  Does not bother her while doing her home exercises   Currently in Pain? Yes   Pain Score --  mild   Pain Location Neck   Pain Orientation Right   Aggravating Factors  band exercises, Later?   Pain Relieving Factors not sure   Multiple Pain Sites Yes  RT low back, intermittant,  not doing exercises today?  better with hip rolls.    sore.                           [facility] Adult PT Treatment/Exercise - 09/16/15 1432    Lumbar Exercises: Aerobic   Stationary Bike Nu step L5 changed to L3  arms 9, seat 6   Lumbar Exercises: Seated   Sit to Stand Limitations 8X limited fatigue.     Other Seated Lumbar Exercises isometric hip adduction 10 X8   Other Seated Lumbar Exercises posture stretch, ER shoulders 5 X no band.   Lumbar Exercises: Supine   Ab Set 10 reps   Clam 10 reps   Heel Slides 10 reps   Bent Knee Raise 10 reps  challange   Bridge 10 reps   Other Supine Lumbar Exercises Supine scapular stabilization series with yellow band 10 X each added to home exercise program:  Sash, chest pull, ER narrow grip flexion                PT Education - 09/16/15 1508    Education provided Yes   Education Details rows.    Person(s) Educated Patient   Methods Explanation;Demonstration;Tactile cues;Verbal cues;Handout  Comprehension Verbalized understanding;Returned demonstration          PT Short Term Goals - 09/16/15 1515    PT SHORT TERM GOAL #1   Title pt will be I with initial HEP (09/17/2014)   Baseline independent3   Time 3   Period Weeks   Status Achieved   PT SHORT TERM GOAL #2   Title pt will be able to verbalize and demonstrate techniques to reduce low back pain and reinjury via posture awareness, lifting and carrying mechanics, and HEP (09/17/2014)   Time 3   Period Weeks   Status On-going           PT Long Term Goals - 09/16/15 1516    PT LONG TERM GOAL #1   Title pt will be I with all HEP as of last visiti (10/09/2015)   Time 6   Period Weeks   Status On-going   PT LONG TERM GOAL #2   Title pt will demonstrate function trunk mobility with </=2/10 pain to promote funcitonal progression (10/09/2015)   Time 6   Period Weeks   Status Unable to assess   PT LONG TERM GOAL #3   Title 6   Period Weeks   Status On-going    PT LONG TERM GOAL #4   Title she will increase her FOTO score >/= 70 to demonstrate improved function at discharge (10/09/2015)   Time 6   Period Weeks   Status Unable to assess               Plan - 09/16/15 1509    Clinical Impression Statement PT 1 X this wek an next due to oncology appointments.  Patient adherent with home exercises, she has a mat she uses on the floor.    Leg tingling has resolved.  Upper trap soreness may be due to new band exercises.  Good tecjniques observed.   PT Next Visit Plan supine band, decompression, row.  Nu step   PT Home Exercise Plan rows, red, bent/straight arms.   Consulted and Agree with Plan of Care Patient        Problem List Patient Active Problem List   Diagnosis Date Noted  . Body mass index (BMI) of 20.0-20.9 in adult 07/21/2015  . Encounter for Medicare annual wellness exam 04/02/2015  . Leg DVT (deep venous thromboembolism), acute ([place])   . Normocytic anemia 01/04/2015  . Mantle cell lymphoma ([place]) 10/18/2014  . Essential tremor 07/15/2014  . Medication management 07/15/2014  . OSA (obstructive sleep apnea) 07/05/2014  . Anxiety state 02/27/2014  . Hyperlipidemia   . Hypertension   . GERD   . Osteoporosis, unspecified   . Osteoarthritis   . Vitamin D deficiency   . Prediabetes   . IBS   . Diverticulosis   . Personal history of colonic polyps 07/19/2011  . Chronic obstructive airway disease with asthma ([place]) 03/16/2011    Tudor Chandley 09/16/2015, 3:18 PM  [facility] [address] [place], [place], [address] Phone: [phone]   Fax:  [phone]  Name: Shannon Parsons MRN: [id number] Date of Birth: 15-May-1941    Melvenia Needles, PTA 09/16/2015 3:18 PM Phone: [phone] Fax: [phone]

## 2015-09-18 ENCOUNTER — Encounter: Payer: Medicare Other | Admitting: Physical Therapy

## 2015-09-18 ENCOUNTER — Ambulatory Visit (INDEPENDENT_AMBULATORY_CARE_PROVIDER_SITE_OTHER): Payer: Medicare Other | Admitting: Physician Assistant

## 2015-09-18 VITALS — BP 118/60 | HR 80 | Temp 97.9°F | Resp 16 | Ht 63.0 in | Wt 118.0 lb

## 2015-09-18 DIAGNOSIS — J069 Acute upper respiratory infection, unspecified: Secondary | ICD-10-CM

## 2015-09-18 MED ORDER — AZITHROMYCIN 250 MG PO TABS: ORAL_TABLET | ORAL | Status: AC

## 2015-09-18 MED ORDER — PREDNISONE 20 MG PO TABS: ORAL_TABLET | ORAL | Status: DC

## 2015-09-18 NOTE — Progress Notes (Signed)
Subjective:    Patient ID: Shannon Parsons, female    DOB: 1941/05/22, 75 y.o.   MRN: [id number]  HPI 75 y.o. WF presents with cough, chest tightness x 1 week. No fever, chills. She has some wheezing, has had to use inhaler, has some post nasal drip. Denies CP, swelling in legs, changes in weight.   Blood pressure 118/60, pulse 80, temperature 97.9 F (36.6 C), temperature source Temporal, resp. rate 16, height 5\' 3"  (1.6 m), weight 118 lb (53.524 kg), last menstrual period 09/14/2014, SpO2 98 %.  Current Outpatient Prescriptions on File Prior to Visit  Medication Sig Dispense Refill  . acetaminophen (TYLENOL) 325 MG tablet Take 325 mg by mouth every 6 (six) hours as needed (For pain.).     [name] albuterol (PROVENTIL HFA;VENTOLIN HFA) 108 (90 BASE) MCG/ACT inhaler Inhale 2 puffs into the lungs every 6 (six) hours as needed for wheezing or shortness of breath (cough). 1 Inhaler 0  . albuterol (PROVENTIL) (2.5 MG/3ML) 0.083% nebulizer solution Take 3 mLs (2.5 mg total) by nebulization every 4 (four) hours as needed for wheezing or shortness of breath. 30 vial 0  . ALPRAZolam (XANAX) 0.5 MG tablet Take 0.5 mg by mouth at bedtime as needed for anxiety.    . Cholecalciferol (VITAMIN D) 2000 UNITS CAPS Take 2,000-4,000 capsules by mouth every other day. Patient alternates 1 cap with 2 caps every other day.    . hyoscyamine (LEVSIN, ANASPAZ) 0.125 MG tablet Take 1 to 2 tablets every 4 hours as needed for nausea, cramping, bloating or diarrhea 90 tablet 11  . levothyroxine (SYNTHROID, LEVOTHROID) 50 MCG tablet TAKE 1 TABLET BY MOUTH ONCE DAILY. 90 tablet 3  . lidocaine-prilocaine (EMLA) cream Apply 1 application topically as needed. Apply to [facility] one hour before access. 30 g 3  . meloxicam (MOBIC) 15 MG tablet Take one daily with food for 2 weeks, can take with tylenol, can not take with aleve, iburpofen, then as needed daily for pain 30 tablet 1  . Multiple Vitamin (MULTIVITAMIN WITH MINERALS) TABS  tablet Take 1 tablet by mouth daily.    . Probiotic Product (PROBIOTIC DAILY PO) Take 1 capsule by mouth daily.    . vitamin B-12 (CYANOCOBALAMIN) 1000 MCG tablet Take 1,000 mcg by mouth daily.     No current facility-administered medications on file prior to visit.   Past Medical History  Diagnosis Date  . OSA (obstructive sleep apnea)   . Hiatal hernia   . IBS (irritable bowel syndrome)   . Fibrocystic breast disease   . Asthma   . Diverticulosis   . Adenomatous colon polyp 1994  . Internal hemorrhoids   . Melanoma ([place])     Facial  . Hyperlipidemia   . Hypertension   . GERD (gastroesophageal reflux disease)   . Osteoporosis   . Osteoarthritis   . Vitamin D deficiency   . Prediabetes   . Unspecified hypothyroidism   . Family history of ischemic heart disease   . Mantle cell lymphoma ([place]) 10/18/2014    Review of Systems  Constitutional: Negative for fever, chills and fatigue.  HENT: Positive for postnasal drip and rhinorrhea. Negative for congestion, ear pain, sore throat and trouble swallowing.   Eyes: Negative.   Respiratory: Positive for cough and chest tightness. Negative for apnea, choking, shortness of breath, wheezing and stridor.   Cardiovascular: Negative for chest pain, palpitations and leg swelling.  Gastrointestinal: Negative for abdominal pain, diarrhea, constipation and blood in stool.  Skin:  Negative.   Neurological: Negative for headaches.       Objective:   Physical Exam  Constitutional: She appears well-developed and well-nourished.  HENT:  Head: Normocephalic and atraumatic.  Neck: Normal range of motion. Neck supple.  Cardiovascular: Normal rate and regular rhythm.   No murmur heard. Pulmonary/Chest: Effort normal. She has decreased breath sounds. She has wheezes. She has no rhonchi. She has no rales. She exhibits no tenderness.  Abdominal: Soft. Bowel sounds are normal.  Lymphadenopathy:    She has no cervical adenopathy.       Assessment  & Plan:  1. Acute URI Do duonebs at home, go to ER if any worsening SOB/CP - azithromycin (ZITHROMAX) 250 MG tablet; Take 2 tablets (500 mg) on  Day 1,  followed by 1 tablet (250 mg) once daily on Days 2 through 5.  Dispense: 6 each; Refill: 1 - predniSONE (DELTASONE) 20 MG tablet; 2 tablets daily for 3 days, 1 tablet daily for 4 days.  Dispense: 10 tablet; Refill: 0

## 2015-09-18 NOTE — Patient Instructions (Signed)
The albuterol is good to help open up your lungs, help chest tightness, and help you breath The ipratropium is good if you have mucus that you need to dry up You can mix them together in your nebulizer or you can do them separately.   I will give you a prescription for an antibiotic, but please only take it if you are not feeling better in 7-10 days.  Bronchitis is mostly caused by viruses and the antibiotic will do nothing.  PLEASE TRY TO DO OVER THE COUNTER TREATMENT AND/OR PREDNISONE FOR 5-7 DAYS AND IF YOU ARE NOT GETTING BETTER OR GETTING WORSE THEN YOU CAN START ON AN ANTIBIOTIC GIVEN.  Can take the prednisone AT NIGHT WITH DINNER, it take 8-12 hours to start working so it will NOT affect your sleeping if you take it at night with your food!! Take two pills the first night and 1 or two pill the second night and then 1 pill the other nights.    Rest and stay hydrated.  Make sure you drink plenty of fluids to make sure urine is clear when you urinate.  Water will help thin out mucous. - Take Mucinex DM- Maximum Strength over the counter to thin out and cough up the thick mucous.  Please follow directions on box. -Take Albuterol if prescribed.  Risk of antibiotic use: About 1 in 4 people who take antibiotics have side effects including stomach problems, dizziness, or rashes. Those problems clear up soon after stopping the drugs, but in rare cases antibiotics can cause severe allergic reaction. Over use of antibiotics also encourages the growth of bacteria that can't be controlled easily with drugs. That makes you more vunerable to antibiotic-resistant infections and undermines the benefits of antibiotics for others.   Waste of Money: Antibiotics often aren't very expensive, but any money spent on unnecessary drugs is money down the drain.   When are antibiotics needed? Only when symptoms last longer than a week.  Start to improve but then worsen again  Please call the office or message  through My Chart if you have any questions.   Acute Bronchitis Bronchitis is when the airways that extend from the windpipe into the lungs get red, puffy, and painful (inflamed). Bronchitis often causes thick spit (mucus) to develop. This leads to a cough. A cough is the most common symptom of bronchitis. In acute bronchitis, the condition usually begins suddenly and goes away over time (usually in 2 weeks). Smoking, allergies, and asthma can make bronchitis worse. Repeated episodes of bronchitis may cause more lung problems.  Most common cause of Bronchitis is viruses (rhinovirus, coronavirus, RSV).  Therefore, not requiring an antibiotic; as antibiotics only treat bacterial infections.  HOME CARE  Rest.  Drink enough fluids to keep your pee (urine) clear or pale yellow (unless you need to limit fluids as told by your doctor).  Only take over-the-counter or prescription medicines as told by your doctor.  Avoid smoking and secondhand smoke. These can make bronchitis worse. If you are a smoker, think about using nicotine gum or skin patches. Quitting smoking will help your lungs heal faster.  Reduce the chance of getting bronchitis again by:  Washing your hands often.  Avoiding people with cold symptoms.  Trying not to touch your hands to your mouth, nose, or eyes.  Follow up with your doctor as told. GET HELP IF: Your symptoms do not improve after 1 week of treatment. Symptoms include:  Cough.  Fever.  Coughing up thick  spit.  Body aches.  Chest congestion.  Chills.  Shortness of breath.  Sore throat. GET HELP RIGHT AWAY IF:   You have an increased fever.  You have chills.  You have severe shortness of breath.  You have bloody thick spit (sputum).  You throw up (vomit) often.  You lose too much body fluid (dehydration).  You have a severe headache.  You faint. MAKE SURE YOU:   Understand these instructions.  Will watch your condition.  Will get  help right away if you are not doing well or get worse. Document Released: 02/16/2008 Document Revised: 05/02/2013 Document Reviewed: 02/20/2013 [facility] Patient Information 2015 [place], [place]. This information is not intended to replace advice given to you by your health care provider. Make sure you discuss any questions you have with your health care provider.

## 2015-09-22 ENCOUNTER — Ambulatory Visit: Payer: Medicare Other | Admitting: Hematology & Oncology

## 2015-09-22 ENCOUNTER — Other Ambulatory Visit: Payer: Medicare Other

## 2015-09-22 ENCOUNTER — Encounter: Payer: Medicare Other | Admitting: Physical Therapy

## 2015-09-22 ENCOUNTER — Ambulatory Visit: Payer: Medicare Other

## 2015-09-23 ENCOUNTER — Other Ambulatory Visit (HOSPITAL_BASED_OUTPATIENT_CLINIC_OR_DEPARTMENT_OTHER): Payer: Medicare Other

## 2015-09-23 ENCOUNTER — Ambulatory Visit (HOSPITAL_BASED_OUTPATIENT_CLINIC_OR_DEPARTMENT_OTHER): Payer: Medicare Other

## 2015-09-23 ENCOUNTER — Ambulatory Visit (HOSPITAL_BASED_OUTPATIENT_CLINIC_OR_DEPARTMENT_OTHER): Payer: Medicare Other | Admitting: Hematology & Oncology

## 2015-09-23 ENCOUNTER — Encounter: Payer: Self-pay | Admitting: Hematology & Oncology

## 2015-09-23 VITALS — BP 121/56 | HR 61 | Temp 98.0°F | Resp 16

## 2015-09-23 VITALS — BP 91/69 | HR 84 | Temp 98.1°F | Resp 16 | Ht 63.0 in | Wt 117.0 lb

## 2015-09-23 DIAGNOSIS — C8313 Mantle cell lymphoma, intra-abdominal lymph nodes: Secondary | ICD-10-CM

## 2015-09-23 DIAGNOSIS — C8315 Mantle cell lymphoma, lymph nodes of inguinal region and lower limb: Secondary | ICD-10-CM

## 2015-09-23 DIAGNOSIS — Z5112 Encounter for antineoplastic immunotherapy: Secondary | ICD-10-CM

## 2015-09-23 DIAGNOSIS — I82409 Acute embolism and thrombosis of unspecified deep veins of unspecified lower extremity: Secondary | ICD-10-CM

## 2015-09-23 DIAGNOSIS — C8310 Mantle cell lymphoma, unspecified site: Secondary | ICD-10-CM

## 2015-09-23 DIAGNOSIS — M810 Age-related osteoporosis without current pathological fracture: Secondary | ICD-10-CM

## 2015-09-23 LAB — CBC WITH DIFFERENTIAL (CANCER CENTER ONLY)
BASO#: 0.1 10*3/uL (ref 0.0–0.2)
BASO%: 1.4 % (ref 0.0–2.0)
EOS ABS: 0.2 10*3/uL (ref 0.0–0.5)
EOS%: 3 % (ref 0.0–7.0)
HEMATOCRIT: 37.8 % (ref 34.8–46.6)
HEMOGLOBIN: 12.5 g/dL (ref 11.6–15.9)
LYMPH#: 0.6 10*3/uL — AB (ref 0.9–3.3)
LYMPH%: 8.6 % — ABNORMAL LOW (ref 14.0–48.0)
MCH: 29.9 pg (ref 26.0–34.0)
MCHC: 33.1 g/dL (ref 32.0–36.0)
MCV: 90 fL (ref 81–101)
MONO#: 0.8 10*3/uL (ref 0.1–0.9)
MONO%: 12.6 % (ref 0.0–13.0)
NEUT%: 74.4 % (ref 39.6–80.0)
NEUTROS ABS: 5 10*3/uL (ref 1.5–6.5)
Platelets: 249 10*3/uL (ref 145–400)
RBC: 4.18 10*6/uL (ref 3.70–5.32)
RDW: 14.3 % (ref 11.1–15.7)
WBC: 6.7 10*3/uL (ref 3.9–10.0)

## 2015-09-23 LAB — CMP (CANCER CENTER ONLY)
ALT(SGPT): 18 U/L (ref 10–47)
AST: 24 U/L (ref 11–38)
Albumin: 3.4 g/dL (ref 3.3–5.5)
Alkaline Phosphatase: 78 U/L (ref 26–84)
BILIRUBIN TOTAL: 0.7 mg/dL (ref 0.20–1.60)
BUN: 13 mg/dL (ref 7–22)
CHLORIDE: 101 meq/L (ref 98–108)
CO2: 27 meq/L (ref 18–33)
CREATININE: 0.6 mg/dL (ref 0.6–1.2)
Calcium: 8.9 mg/dL (ref 8.0–10.3)
Glucose, Bld: 102 mg/dL (ref 73–118)
Potassium: 3.6 mEq/L (ref 3.3–4.7)
SODIUM: 135 meq/L (ref 128–145)
Total Protein: 6 g/dL — ABNORMAL LOW (ref 6.4–8.1)

## 2015-09-23 LAB — LACTATE DEHYDROGENASE: LDH: 177 U/L (ref 125–245)

## 2015-09-23 MED ORDER — SODIUM CHLORIDE 0.9 % IV SOLN
375.0000 mg/m2 | Freq: Once | INTRAVENOUS | Status: AC
  Administered 2015-09-23: 600 mg via INTRAVENOUS
  Filled 2015-09-23: qty 50

## 2015-09-23 MED ORDER — HEPARIN SOD (PORK) LOCK FLUSH 100 UNIT/ML IV SOLN
500.0000 [IU] | Freq: Once | INTRAVENOUS | Status: AC | PRN
  Administered 2015-09-23: 500 [IU]
  Filled 2015-09-23: qty 5

## 2015-09-23 MED ORDER — DIPHENHYDRAMINE HCL 25 MG PO CAPS
50.0000 mg | ORAL_CAPSULE | Freq: Once | ORAL | Status: AC
  Administered 2015-09-23: 50 mg via ORAL

## 2015-09-23 MED ORDER — SODIUM CHLORIDE 0.9 % IV SOLN
Freq: Once | INTRAVENOUS | Status: AC
  Administered 2015-09-23: 09:00:00 via INTRAVENOUS

## 2015-09-23 MED ORDER — SODIUM CHLORIDE 0.9 % IJ SOLN
10.0000 mL | INTRAMUSCULAR | Status: DC | PRN
  Administered 2015-09-23: 10 mL
  Filled 2015-09-23: qty 10

## 2015-09-23 MED ORDER — ACETAMINOPHEN 325 MG PO TABS
650.0000 mg | ORAL_TABLET | Freq: Once | ORAL | Status: AC
  Administered 2015-09-23: 650 mg via ORAL

## 2015-09-23 MED ORDER — DIPHENHYDRAMINE HCL 25 MG PO CAPS
ORAL_CAPSULE | ORAL | Status: AC
  Filled 2015-09-23: qty 2

## 2015-09-23 MED ORDER — ACETAMINOPHEN 325 MG PO TABS
ORAL_TABLET | ORAL | Status: AC
  Filled 2015-09-23: qty 2

## 2015-09-23 NOTE — Progress Notes (Signed)
Hematology and Oncology Follow Up Visit  Shannon Parsons:[id number] 01-21-1941 75 y.o. 09/23/2015   Principle Diagnosis:   Mantle cell lymphoma (MCIPI = 5)  Thromboembolic event of the right leg  Current Therapy:    Rituxan - maintanence - s/p cycle #1     Interim History:  Ms.  [name] is back for follow-up.she is doing quite well. She enjoyed the holidays. She had a very quiet holiday with her husband. She is now off Xarelto. She finished up I think back in December. She's had no problem with pain in the right leg. Her last Doppler was done back in June, did not show any residual thrombus.  We have her on maintenance Rituxan. There's been some good data which shows maintenance Rituxan helps with respect to decreasing recurrence. Patient is taking vitamin D. I think vitamin D levels will be helpful. Her last vitamin D level in November was 41.   She is not a candidate for any high-dose chemotherapy. She has a marginal performance status and I'm not sure she would be able to tolerate high-dose chemotherapy with stem cell support.  She is eating better. She has had no fever. She's had no change in bowel or bladder habits. She has had no nausea or vomiting. There has been no bleeding.  Overall, her performance status is ECOG 1   Medications:  Current outpatient prescriptions:  .  acetaminophen (TYLENOL) 325 MG tablet, Take 325 mg by mouth every 6 (six) hours as needed (For pain.). , Disp: , Rfl:  .  albuterol (PROVENTIL HFA;VENTOLIN HFA) 108 (90 BASE) MCG/ACT inhaler, Inhale 2 puffs into the lungs every 6 (six) hours as needed for wheezing or shortness of breath (cough)., Disp: 1 Inhaler, Rfl: 0 .  albuterol (PROVENTIL) (2.5 MG/3ML) 0.083% nebulizer solution, Take 3 mLs (2.5 mg total) by nebulization every 4 (four) hours as needed for wheezing or shortness of breath., Disp: 30 vial, Rfl: 0 .  ALPRAZolam (XANAX) 0.5 MG tablet, Take 0.5 mg by mouth at bedtime as needed for anxiety.,  Disp: , Rfl:  .  azithromycin (ZITHROMAX) 250 MG tablet, Take 2 tablets (500 mg) on  Day 1,  followed by 1 tablet (250 mg) once daily on Days 2 through 5., Disp: 6 each, Rfl: 1 .  Cholecalciferol (VITAMIN D) 2000 UNITS CAPS, Take 2,000-4,000 capsules by mouth every other day. Patient alternates 1 cap with 2 caps every other day., Disp: , Rfl:  .  hyoscyamine (LEVSIN, ANASPAZ) 0.125 MG tablet, Take 1 to 2 tablets every 4 hours as needed for nausea, cramping, bloating or diarrhea, Disp: 90 tablet, Rfl: 11 .  levothyroxine (SYNTHROID, LEVOTHROID) 50 MCG tablet, TAKE 1 TABLET BY MOUTH ONCE DAILY., Disp: 90 tablet, Rfl: 3 .  lidocaine-prilocaine (EMLA) cream, Apply 1 application topically as needed. Apply to [facility] one hour before access., Disp: 30 g, Rfl: 3 .  meloxicam (MOBIC) 15 MG tablet, Take one daily with food for 2 weeks, can take with tylenol, can not take with aleve, iburpofen, then as needed daily for pain, Disp: 30 tablet, Rfl: 1 .  Multiple Vitamin (MULTIVITAMIN WITH MINERALS) TABS tablet, Take 1 tablet by mouth daily., Disp: , Rfl:  .  predniSONE (DELTASONE) 20 MG tablet, 2 tablets daily for 3 days, 1 tablet daily for 4 days., Disp: 10 tablet, Rfl: 0 .  Probiotic Product (PROBIOTIC DAILY PO), Take 1 capsule by mouth daily., Disp: , Rfl:  .  vitamin B-12 (CYANOCOBALAMIN) 1000 MCG tablet, Take 1,000 mcg  by mouth daily., Disp: , Rfl:   Allergies:  Allergies  Allergen Reactions  . Actonel [Risedronate Sodium] Other (See Comments)    "made me choke"   . Fosamax [Alendronate] Other (See Comments)    "made me choke"  . Sulfa Antibiotics Other (See Comments)    Doesn't remember   . Tetracyclines & Related Rash    Past Medical History, Surgical history, Social history, and Family History were reviewed and updated.  Review of Systems: As above  Physical Exam:  height is 5\' 3"  (1.6 m) and weight is 117 lb (53.071 kg). Her oral temperature is 98.1 F (36.7 C). Her blood pressure is 91/69 and  her pulse is 84. Her respiration is 16.   Thin but fairly well-nourished white female. Head and neck exam shows no ocular or oral lesions. She has no bilateral cervical or supraclavicular lymph nodes. She has no palpable thyroid. Lungs are clear. Cardiac exam regular rate and rhythm with no murmurs, rubs or bruits. Axillary exam shows no obvious bilateral axillary adenopathy. Abdomen is soft. She has decent bowel sounds. There is no fluid wave. Her spleen tip is just below the left costal margin. There is no hepatomegaly. Back exam shows no tenderness over the spine, ribs or hips. Extremities shows no clubbing, cyanosis or edema. No palpable venous cord is noted in the right leg. She has good range of motion of her joints. She has good strength in her muscles. Neurological exam is nonfocal. Skin exam shows no rashes, ecchymoses or petechia  Lab Results  Component Value Date   WBC 3.6* 08/01/2015   HGB 12.0 08/01/2015   HCT 36.8 08/01/2015   MCV 93 08/01/2015   PLT 180 08/01/2015     Chemistry      Component Value Date/Time   NA 138 08/01/2015 1124   NA 137 04/23/2015 0803   NA 135 04/02/2015 1511   K 4.0 08/01/2015 1124   K 3.4 04/23/2015 0803   K 3.7 04/02/2015 1511   CL 107 04/23/2015 0803   CL 102 04/02/2015 1511   CO2 28 08/01/2015 1124   CO2 24 04/23/2015 0803   CO2 22 04/02/2015 1511   BUN 10.4 08/01/2015 1124   BUN 16 04/23/2015 0803   BUN 16 04/02/2015 1511   CREATININE 0.8 08/01/2015 1124   CREATININE 1.0 04/23/2015 0803   CREATININE 0.64 02/22/2015 0405      Component Value Date/Time   CALCIUM 9.6 08/01/2015 1124   CALCIUM 9.9 04/23/2015 0803   CALCIUM 8.9 04/02/2015 1511   ALKPHOS 88 08/01/2015 1124   ALKPHOS 48 04/23/2015 0803   ALKPHOS 48 04/02/2015 1511   AST 23 08/01/2015 1124   AST 29 04/23/2015 0803   AST 19 04/02/2015 1511   ALT 12 08/01/2015 1124   ALT 25 04/23/2015 0803   ALT 11 04/02/2015 1511   BILITOT <0.30 08/01/2015 1124   BILITOT 0.70  04/23/2015 0803   BILITOT 0.3 04/02/2015 1511         Impression and Plan: [name] is 75 year old white female. She has mantle cell lymphoma. Again, by the [facility] she is an intermediate risk.  She has responded very well. Again, we have to watch out for recurrence. She is at risk for recurrence.  We will continue her on the maintenance Rituxan. The severe second cycle.  We'll plan to get her back in 2 months.  I did not see a need for any scans right now.  I spent about 35 minutes  with she and her husband this morning.  Volanda Napoleon, MD 1/10/20178:49 AM

## 2015-09-23 NOTE — Patient Instructions (Signed)
Rituximab injection What is this medicine? RITUXIMAB (ri TUX i mab) is a monoclonal antibody. It is used commonly to treat non-Hodgkin lymphoma and other conditions. It is also used to treat rheumatoid arthritis (RA). In RA, this medicine slows the inflammatory process and help reduce joint pain and swelling. This medicine is often used with other cancer or arthritis medications. This medicine may be used for other purposes; ask your health care provider or pharmacist if you have questions. What should I tell my health care provider before I take this medicine? They need to know if you have any of these conditions: -blood disorders -heart disease -history of hepatitis B -infection (especially a virus infection such as chickenpox, cold sores, or herpes) -irregular heartbeat -kidney disease -lung or breathing disease, like asthma -lupus -an unusual or allergic reaction to rituximab, mouse proteins, other medicines, foods, dyes, or preservatives -pregnant or trying to get pregnant -breast-feeding How should I use this medicine? This medicine is for infusion into a vein. It is administered in a hospital or clinic by a specially trained health care professional. A special MedGuide will be given to you by the pharmacist with each prescription and refill. Be sure to read this information carefully each time. Talk to your pediatrician regarding the use of this medicine in children. This medicine is not approved for use in children. Overdosage: If you think you have taken too much of this medicine contact a poison control center or emergency room at once. NOTE: This medicine is only for you. Do not share this medicine with others. What if I miss a dose? It is important not to miss a dose. Call your doctor or health care professional if you are unable to keep an appointment. What may interact with this medicine? -cisplatin -medicines for blood pressure -some other medicines for  arthritis -vaccines This list may not describe all possible interactions. Give your health care provider a list of all the medicines, herbs, non-prescription drugs, or dietary supplements you use. Also tell them if you smoke, drink alcohol, or use illegal drugs. Some items may interact with your medicine. What should I watch for while using this medicine? Report any side effects that you notice during your treatment right away, such as changes in your breathing, fever, chills, dizziness or lightheadedness. These effects are more common with the first dose. Visit your prescriber or health care professional for checks on your progress. You will need to have regular blood work. Report any other side effects. The side effects of this medicine can continue after you finish your treatment. Continue your course of treatment even though you feel ill unless your doctor tells you to stop. Call your doctor or health care professional for advice if you get a fever, chills or sore throat, or other symptoms of a cold or flu. Do not treat yourself. This drug decreases your body's ability to fight infections. Try to avoid being around people who are sick. This medicine may increase your risk to bruise or bleed. Call your doctor or health care professional if you notice any unusual bleeding. Be careful brushing and flossing your teeth or using a toothpick because you may get an infection or bleed more easily. If you have any dental work done, tell your dentist you are receiving this medicine. Avoid taking products that contain aspirin, acetaminophen, ibuprofen, naproxen, or ketoprofen unless instructed by your doctor. These medicines may hide a fever. Do not become pregnant while taking this medicine. Women should inform their doctor if   they wish to become pregnant or think they might be pregnant. There is a potential for serious side effects to an unborn child. Talk to your health care professional or pharmacist for more  information. Do not breast-feed an infant while taking this medicine. What side effects may I notice from receiving this medicine? Side effects that you should report to your doctor or health care professional as soon as possible: -allergic reactions like skin rash, itching or hives, swelling of the face, lips, or tongue -low blood counts - this medicine may decrease the number of white blood cells, red blood cells and platelets. You may be at increased risk for infections and bleeding. -signs of infection - fever or chills, cough, sore throat, pain or difficulty passing urine -signs of decreased platelets or bleeding - bruising, pinpoint red spots on the skin, black, tarry stools, blood in the urine -signs of decreased red blood cells - unusually weak or tired, fainting spells, lightheadedness -breathing problems -confused, not responsive -chest pain -fast, irregular heartbeat -feeling faint or lightheaded, falls -mouth sores -redness, blistering, peeling or loosening of the skin, including inside the mouth -stomach pain -swelling of the ankles, feet, or hands -trouble passing urine or change in the amount of urine Side effects that usually do not require medical attention (report to your doctor or other health care professional if they continue or are bothersome): -anxiety -headache -loss of appetite -muscle aches -nausea -night sweats This list may not describe all possible side effects. Call your doctor for medical advice about side effects. You may report side effects to FDA at 1-800-FDA-1088. Where should I keep my medicine? This drug is given in a hospital or clinic and will not be stored at home. NOTE: This sheet is a summary. It may not cover all possible information. If you have questions about this medicine, talk to your doctor, pharmacist, or health care provider.    2016, Elsevier/Gold Standard. (2014-11-06 22:30:56)  

## 2015-09-24 ENCOUNTER — Encounter: Payer: Medicare Other | Admitting: Physical Therapy

## 2015-09-29 ENCOUNTER — Ambulatory Visit: Payer: Medicare Other | Admitting: Physical Therapy

## 2015-09-29 DIAGNOSIS — M62830 Muscle spasm of back: Secondary | ICD-10-CM

## 2015-09-29 DIAGNOSIS — R293 Abnormal posture: Secondary | ICD-10-CM

## 2015-09-29 DIAGNOSIS — M545 Low back pain, unspecified: Secondary | ICD-10-CM

## 2015-09-29 DIAGNOSIS — R29898 Other symptoms and signs involving the musculoskeletal system: Secondary | ICD-10-CM

## 2015-09-29 NOTE — Therapy (Signed)
Shannon Parsons, [place], [address] Phone: [phone]   Fax:  [phone]  Physical Therapy Treatment  Patient Details  Name: Shannon Parsons MRN: [id number] Date of Birth: 05-18-1941 Referring Provider: Karenann Cai PA-C  Encounter Date: 09/29/2015      PT End of Session - 09/29/15 1546    Visit Number 4   Number of Visits 12   Date for PT Re-Evaluation 10/09/15   Authorization Type Medicare: kx mod by 15th visit, and progress note by 9th visit   PT Start Time 1415   PT Stop Time 1500   PT Time Calculation (min) 45 min   Activity Tolerance Patient tolerated treatment well   Behavior During Therapy [facility] for tasks assessed/performed      Past Medical History  Diagnosis Date  . OSA (obstructive sleep apnea)   . Hiatal hernia   . IBS (irritable bowel syndrome)   . Fibrocystic breast disease   . Asthma   . Diverticulosis   . Adenomatous colon polyp 1994  . Internal hemorrhoids   . Melanoma ([place])     Facial  . Hyperlipidemia   . Hypertension   . GERD (gastroesophageal reflux disease)   . Osteoporosis   . Osteoarthritis   . Vitamin D deficiency   . Prediabetes   . Unspecified hypothyroidism   . Family history of ischemic heart disease   . Mantle cell lymphoma ([place]) 10/18/2014    Past Surgical History  Procedure Laterality Date  . Appendectomy    . Tonsillectomy    . Bunionectomy    . Knee arthroscopy    . Umbilical hernia repair      There were no vitals filed for this visit.  Visit Diagnosis:  Bilateral low back pain without sciatica  Back muscle spasm  Weakness of both hips  Abnormal posture      Subjective Assessment - 09/29/15 1410    Subjective "the back is getting better, it is nothing like it was when I first started"    Currently in Pain? Yes   Pain Score 1    Pain Location Back   Pain Type Chronic pain   Pain Onset More than a month ago   Pain Frequency Occasional   Aggravating Factors  N/A   Pain Relieving Factors N/A                         [facility] Adult PT Treatment/Exercise - 09/29/15 1415    Lumbar Exercises: Stretches   Lower Trunk Rotation 20 seconds;3 reps   Lumbar Exercises: Aerobic   Stationary Bike Nu step L4 x 10   started at L6 x 3 min, dropped to L4 x 6 min   Lumbar Exercises: Standing   Other Standing Lumbar Exercises standing hip extension/ abduction 2 x 10  seated    Lumbar Exercises: Seated   Sit to Stand 15 reps   Other Seated Lumbar Exercises seated marching on dynadisc x 15    Lumbar Exercises: Supine   Bent Knee Raise 10 reps  x 2 sets                PT Education - 09/29/15 1545    Education provided Yes   Education Details updated HEP   Person(s) Educated Patient   Methods Explanation   Comprehension Verbalized understanding          PT Short Term Goals - 09/29/15 1549    PT  SHORT TERM GOAL #1   Title pt will be I with initial HEP (09/17/2014)   Baseline independent3   Time 3   Period Weeks   Status Achieved   PT SHORT TERM GOAL #2   Title pt will be able to verbalize and demonstrate techniques to reduce low back pain and reinjury via posture awareness, lifting and carrying mechanics, and HEP (09/17/2014)   Baseline golfer's lift uses often   Time 3   Period Weeks   Status Achieved           PT Long Term Goals - 09/29/15 1550    PT LONG TERM GOAL #1   Title pt will be I with all HEP as of last visiti (10/09/2015)   Time 6   Period Weeks   Status On-going   PT LONG TERM GOAL #2   Title pt will demonstrate function trunk mobility with </=2/10 pain to promote funcitonal progression (10/09/2015)   Time 6   Period Weeks   Status On-going   PT LONG TERM GOAL #3   Title pt will be able to lift and carry >/= 8 for 50 ft with </=2/10 pain while exhibiting proper mechanics to promote lifting and carrying activities (10/09/2015)   Time 6   Period Weeks   Status On-going   PT LONG  TERM GOAL #4   Title she will increase her FOTO score >/= 70 to demonstrate improved function at discharge (10/09/2015)   Time 6   Period Weeks   Status On-going               Plan - 09/29/15 [name] continues to make progress with therapy with report that she is doing much better with little to no pain. focused on core and hip strengthening which she was able to complete all without any pain. Discussed that if she continues to do well that we may discuss possibility of discharge depending on how she is doing the next 1-2 visits.    PT Next Visit Plan core strengthening, hip strengthening, hip Nu-step.    PT Home Exercise Plan supine marching, bridge with clam shell, standing hip extension/ abduction   Consulted and Agree with Plan of Care Patient        Problem List Patient Active Problem List   Diagnosis Date Noted  . Body mass index (BMI) of 20.0-20.9 in adult 07/21/2015  . Encounter for Medicare annual wellness exam 04/02/2015  . Leg DVT (deep venous thromboembolism), acute ([place])   . Normocytic anemia 01/04/2015  . Mantle cell lymphoma ([place]) 10/18/2014  . Essential tremor 07/15/2014  . Medication management 07/15/2014  . OSA (obstructive sleep apnea) 07/05/2014  . Anxiety state 02/27/2014  . Hyperlipidemia   . Hypertension   . GERD   . Osteoporosis, unspecified   . Osteoarthritis   . Vitamin D deficiency   . Prediabetes   . IBS   . Diverticulosis   . Personal history of colonic polyps 07/19/2011  . Chronic obstructive airway disease with asthma ([place]) 03/16/2011   Shannon Parsons PT, DPT, LAT, ATC  09/29/2015  3:52 PM     [facility] [address] [place], [place], [address] Phone: [phone]   Fax:  [phone]  Name: Shannon Parsons MRN: [id number] Date of Birth: 1941/06/01

## 2015-10-06 ENCOUNTER — Ambulatory Visit: Payer: Medicare Other | Admitting: Physical Therapy

## 2015-10-08 ENCOUNTER — Ambulatory Visit: Payer: Medicare Other | Admitting: Physical Therapy

## 2015-10-08 DIAGNOSIS — M545 Low back pain, unspecified: Secondary | ICD-10-CM

## 2015-10-08 DIAGNOSIS — R293 Abnormal posture: Secondary | ICD-10-CM

## 2015-10-08 DIAGNOSIS — R29898 Other symptoms and signs involving the musculoskeletal system: Secondary | ICD-10-CM

## 2015-10-08 DIAGNOSIS — M62830 Muscle spasm of back: Secondary | ICD-10-CM

## 2015-10-08 NOTE — Therapy (Addendum)
[place], [place], [address] Phone: [phone]   Fax:  [phone]  Physical Therapy Treatment  Patient Details  Name: Shannon Parsons MRN: [id number] Date of Birth: 08/23/1941 Referring Provider: Karenann Cai PA-C  Encounter Date: 10/08/2015      PT End of Session - 10/08/15 1806    Visit Number 5   Number of Visits 12   Date for PT Re-Evaluation 10/09/15   PT Start Time 9741   PT Stop Time 1503   PT Time Calculation (min) 44 min   Activity Tolerance Patient tolerated treatment well;No increased pain   Behavior During Therapy [facility] for tasks assessed/performed      Past Medical History  Diagnosis Date  . OSA (obstructive sleep apnea)   . Hiatal hernia   . IBS (irritable bowel syndrome)   . Fibrocystic breast disease   . Asthma   . Diverticulosis   . Adenomatous colon polyp 1994  . Internal hemorrhoids   . Melanoma ([place])     Facial  . Hyperlipidemia   . Hypertension   . GERD (gastroesophageal reflux disease)   . Osteoporosis   . Osteoarthritis   . Vitamin D deficiency   . Prediabetes   . Unspecified hypothyroidism   . Family history of ischemic heart disease   . Mantle cell lymphoma ([place]) 10/18/2014    Past Surgical History  Procedure Laterality Date  . Appendectomy    . Tonsillectomy    . Bunionectomy    . Knee arthroscopy    . Umbilical hernia repair      There were no vitals filed for this visit.  Visit Diagnosis:  Bilateral low back pain without sciatica  Back muscle spasm  Weakness of both hips  Abnormal posture      Subjective Assessment - 10/08/15 1423    Subjective No pain since the last visit.  My hips were sore this morning after working out last night.  I think this is my last visit.    Currently in Pain? No/denies            [facility] PT Assessment - 10/08/15 0001    Observation/Other Assessments   Focus on Therapeutic Outcomes (FOTO)  18% limited  29 %  limitation predicted   AROM   AROM Assessment Site --  Did not measure, WNL in clinic                     [facility] Adult PT Treatment/Exercise - 10/08/15 1425    Lumbar Exercises: Stretches   Lower Trunk Rotation --  5X10   Lumbar Exercises: Aerobic   Stationary Bike NuStep L 5 X minutes   Lumbar Exercises: Standing   Other Standing Lumbar Exercises Shanding hip abduction/extension 10 X 2 sets each   Lumbar Exercises: Seated   Hip Flexion on Ball Limitations on air disc march, march with reach, LAQ And SBA cues 5 x 2 sets each   Lumbar Exercises: Supine   Ab Set 10 reps   Clam 10 reps   Bent Knee Raise 10 reps  cues   Bridge 10 reps   Bridge Limitations bridge with clam   Other Supine Lumbar Exercises scapular stabilization yellow ,whole series                  PT Short Term Goals - 09/29/15 1549    PT SHORT TERM GOAL #1   Title pt will be I with initial HEP (09/17/2014)  Baseline independent3   Time 3   Period Weeks   Status Achieved   PT SHORT TERM GOAL #2   Title pt will be able to verbalize and demonstrate techniques to reduce low back pain and reinjury via posture awareness, lifting and carrying mechanics, and HEP (09/17/2014)   Baseline golfer's lift uses often   Time 3   Period Weeks   Status Achieved           PT Long Term Goals - 10/08/15 1808    PT LONG TERM GOAL #1   Title pt will be I with all HEP as of last visiti (10/09/2015)   Baseline independent   Time 6   Period Weeks   Status Achieved   PT LONG TERM GOAL #2   Title pt will demonstrate function trunk mobility with </=2/10 pain to promote funcitonal progression (10/09/2015)   Baseline WNL observed in clinic,  ROM not formally measured.     Time 6   Period Weeks   Status Partially Met   PT LONG TERM GOAL #3   Title pt will be able to lift and carry >/= 8 for 50 ft with </=2/10 pain while exhibiting proper mechanics to promote lifting and carrying activities (10/09/2015)    Baseline can carry 8 LBS or more without pain   Period Weeks   Status Achieved   PT LONG TERM GOAL #4   Title she will increase her FOTO score >/= 70 to demonstrate improved function at discharge (10/09/2015)   Baseline score:  82   Time 6   Period Weeks   Status Achieved               Plan - 10/08/15 1807    Clinical Impression Statement Last visit today.  patient is pleased with her progress . No pain.  Goals checked and all were met or exceeded except Trunk ROM not formally measured.   WNL noted in gym during exercises. .   PT Next Visit Plan Discharge   PT Home Exercise Plan continue   Consulted and Agree with Plan of Care Patient        Problem List Patient Active Problem List   Diagnosis Date Noted  . Body mass index (BMI) of 20.0-20.9 in adult 07/21/2015  . Encounter for Medicare annual wellness exam 04/02/2015  . Leg DVT (deep venous thromboembolism), acute ([place])   . Normocytic anemia 01/04/2015  . Mantle cell lymphoma ([place]) 10/18/2014  . Essential tremor 07/15/2014  . Medication management 07/15/2014  . OSA (obstructive sleep apnea) 07/05/2014  . Anxiety state 02/27/2014  . Hyperlipidemia   . Hypertension   . GERD   . Osteoporosis, unspecified   . Osteoarthritis   . Vitamin D deficiency   . Prediabetes   . IBS   . Diverticulosis   . Personal history of colonic polyps 07/19/2011  . Chronic obstructive airway disease with asthma ([place]) 03/16/2011    Haruka Kowaleski 10/08/2015, 6:14 PM  [facility] [address] [place], [place], [address] Phone: [phone]   Fax:  [phone]  Name: Shannon Parsons MRN: [id number] Date of Birth: 06-16-1941    Melvenia Needles, PTA 10/08/2015 6:14 PM Phone: [phone] Fax: [phone]    PHYSICAL THERAPY DISCHARGE SUMMARY  Visits from Start of Care: 5  Current functional level related to goals / functional outcomes: FOTO 18% limited   Remaining  deficits: N/A   Education / Equipment: HEP, theraband.   Plan: Patient agrees to discharge.  Patient goals were met. Patient is being discharged due to being pleased with the current functional level.  ?????       Kristoffer Leamon PT, DPT, LAT, ATC  10/09/2015  9:57 AM

## 2015-10-08 NOTE — Patient Instructions (Signed)
Continue with home exercise. Call if any questions.

## 2015-10-20 ENCOUNTER — Telehealth: Payer: Self-pay | Admitting: *Deleted

## 2015-10-20 NOTE — Telephone Encounter (Signed)
Patient c/o upper respiratory infection as well as sinus congestion. She states she hasn't had a fever and has been symptomatic for 2-3 days. She would like to know if there is anything else that can be prescribed for her.  It was explained to patient that this illness is mostly viral and the best treatment was rest, fluids and OTC decongestants/expectorants. Reviewed symptoms with Dr Marin Olp and he agrees.  Patient instructed to call office if she develops a fever or symptoms worsen. She understood.

## 2015-10-23 ENCOUNTER — Encounter: Payer: Self-pay | Admitting: Physician Assistant

## 2015-10-23 ENCOUNTER — Ambulatory Visit (INDEPENDENT_AMBULATORY_CARE_PROVIDER_SITE_OTHER): Payer: Medicare Other | Admitting: Physician Assistant

## 2015-10-23 VITALS — BP 112/64 | HR 82 | Temp 97.3°F | Resp 14 | Ht 63.0 in | Wt 117.0 lb

## 2015-10-23 DIAGNOSIS — E559 Vitamin D deficiency, unspecified: Secondary | ICD-10-CM

## 2015-10-23 DIAGNOSIS — J45909 Unspecified asthma, uncomplicated: Secondary | ICD-10-CM | POA: Diagnosis not present

## 2015-10-23 DIAGNOSIS — J069 Acute upper respiratory infection, unspecified: Secondary | ICD-10-CM

## 2015-10-23 DIAGNOSIS — J449 Chronic obstructive pulmonary disease, unspecified: Secondary | ICD-10-CM

## 2015-10-23 DIAGNOSIS — C8310 Mantle cell lymphoma, unspecified site: Secondary | ICD-10-CM | POA: Diagnosis not present

## 2015-10-23 DIAGNOSIS — R7303 Prediabetes: Secondary | ICD-10-CM | POA: Diagnosis not present

## 2015-10-23 DIAGNOSIS — I10 Essential (primary) hypertension: Secondary | ICD-10-CM

## 2015-10-23 DIAGNOSIS — D649 Anemia, unspecified: Secondary | ICD-10-CM

## 2015-10-23 DIAGNOSIS — E785 Hyperlipidemia, unspecified: Secondary | ICD-10-CM

## 2015-10-23 DIAGNOSIS — Z79899 Other long term (current) drug therapy: Secondary | ICD-10-CM

## 2015-10-23 LAB — LIPID PANEL
CHOL/HDL RATIO: 5.1 ratio — AB (ref <=5.0)
CHOLESTEROL: 188 mg/dL (ref 125–200)
HDL: 37 mg/dL — AB (ref >=46)
LDL CALC: 125 mg/dL (ref <130)
TRIGLYCERIDES: 132 mg/dL (ref <150)
VLDL: 26 mg/dL (ref <30)

## 2015-10-23 LAB — MAGNESIUM: MAGNESIUM: 1.8 mg/dL (ref 1.5–2.5)

## 2015-10-23 LAB — TSH: TSH: 1.92 mIU/L

## 2015-10-23 MED ORDER — PREDNISONE 20 MG PO TABS: ORAL_TABLET | ORAL | Status: DC

## 2015-10-23 NOTE — Progress Notes (Signed)
Assessment and Plan:  1. Hypertension -Continue medication, monitor blood pressure at home. Continue DASH diet.  Reminder to go to the ER if any CP, SOB, nausea, dizziness, severe HA, changes vision/speech, left arm numbness and tingling and jaw pain.  2. Cholesterol -Continue diet and exercise. Check cholesterol.   3. Prediabetes  -Continue diet and exercise. Check A1C  4. Vitamin D Def - check level and continue medications.   5. Hypothyroidism -check TSH level, continue medications the same, reminded to take on an empty stomach 30-61mins before food.   6. URI Will hold the zpak and take if she is not getting better, increase fluids, rest, cont allergy pill   Continue diet and meds as discussed. Further disposition pending results of labs. Over 30 minutes of exam, counseling, chart review, and critical decision making was performed  Future Appointments Date Time Provider [place]  11/24/2015 9:45 AM [facility] LAB [facility] None  11/24/2015 10:00 AM Volanda Napoleon, MD [facility] None  11/24/2015 10:30 AM [facility] CHAIR 1 [facility] None  01/21/2016 2:30 PM Unk Pinto, MD [facility] None  08/23/2016 2:00 PM Unk Pinto, MD [facility] None     HPI 75 y.o. female  presents for 3 month follow up on hypertension, cholesterol, prediabetes, and vitamin D deficiency.   She also has history of mantle cell lymphoma, following with Dr. Marin Olp at [facility], had normal PET 06/11/2015, currently on maintenance Rituxan and tolerating well.  She has history of asthma, x 1 week has had sinus congestion with yellow mucus, has dry cough, no fever chills. Feels very fatigued, denies SOB, wheezing, CP. Has albuterol, tylenol, and has been taking cold ezz. Treated with prednisone in Jan, did not take the zpak.   Her blood pressure has been controlled at home, today their BP is BP: 112/64 mmHg  She does not workout but will walk occ. She denies chest pain, shortness of breath, dizziness.   She is not on cholesterol medication and denies myalgias. Her cholesterol is not at goal. The cholesterol last visit was:   Lab Results  Component Value Date   CHOL 235* 07/21/2015   HDL 49 07/21/2015   LDLCALC 144* 07/21/2015   TRIG 209* 07/21/2015   CHOLHDL 4.8 07/21/2015    She has been working on diet and exercise for prediabetes, and denies paresthesia of the feet, polydipsia, polyuria and visual disturbances. Last A1C in the office was:  Lab Results  Component Value Date   HGBA1C 5.7* 07/21/2015   Patient is on Vitamin D supplement.   Lab Results  Component Value Date   VD25OH 41 07/21/2015     She is on thyroid medication. Her medication was not changed last visit.   Lab Results  Component Value Date   TSH 1.923 07/21/2015  .    Current Medications:  Current Outpatient Prescriptions on File Prior to Visit  Medication Sig Dispense Refill  . acetaminophen (TYLENOL) 325 MG tablet Take 325 mg by mouth every 6 (six) hours as needed (For pain.).     [name] albuterol (PROVENTIL HFA;VENTOLIN HFA) 108 (90 BASE) MCG/ACT inhaler Inhale 2 puffs into the lungs every 6 (six) hours as needed for wheezing or shortness of breath (cough). 1 Inhaler 0  . albuterol (PROVENTIL) (2.5 MG/3ML) 0.083% nebulizer solution Take 3 mLs (2.5 mg total) by nebulization every 4 (four) hours as needed for wheezing or shortness of breath. 30 vial 0  . ALPRAZolam (XANAX) 0.5 MG tablet Take 0.5 mg by mouth at bedtime as  needed for anxiety.    . Cholecalciferol (VITAMIN D) 2000 UNITS CAPS Take 2,000-4,000 capsules by mouth every other day. Patient alternates 1 cap with 2 caps every other day.    . hyoscyamine (LEVSIN, ANASPAZ) 0.125 MG tablet Take 1 to 2 tablets every 4 hours as needed for nausea, cramping, bloating or diarrhea 90 tablet 11  . levothyroxine (SYNTHROID, LEVOTHROID) 50 MCG tablet TAKE 1 TABLET BY MOUTH ONCE DAILY. 90 tablet 3  . lidocaine-prilocaine (EMLA) cream Apply 1 application topically as needed.  Apply to [facility] one hour before access. 30 g 3  . meloxicam (MOBIC) 15 MG tablet Take one daily with food for 2 weeks, can take with tylenol, can not take with aleve, iburpofen, then as needed daily for pain 30 tablet 1  . Multiple Vitamin (MULTIVITAMIN WITH MINERALS) TABS tablet Take 1 tablet by mouth daily.    . Probiotic Product (PROBIOTIC DAILY PO) Take 1 capsule by mouth daily.    . vitamin B-12 (CYANOCOBALAMIN) 1000 MCG tablet Take 1,000 mcg by mouth daily.     No current facility-administered medications on file prior to visit.   Medical History:  Past Medical History  Diagnosis Date  . OSA (obstructive sleep apnea)   . Hiatal hernia   . IBS (irritable bowel syndrome)   . Fibrocystic breast disease   . Asthma   . Diverticulosis   . Adenomatous colon polyp 1994  . Internal hemorrhoids   . Melanoma ([place])     Facial  . Hyperlipidemia   . Hypertension   . GERD (gastroesophageal reflux disease)   . Osteoporosis   . Osteoarthritis   . Vitamin D deficiency   . Prediabetes   . Unspecified hypothyroidism   . Family history of ischemic heart disease   . Mantle cell lymphoma ([place]) 10/18/2014   Allergies:  Allergies  Allergen Reactions  . Actonel [Risedronate Sodium] Other (See Comments)    "made me choke"   . Fosamax [Alendronate] Other (See Comments)    "made me choke"  . Sulfa Antibiotics Other (See Comments)    Doesn't remember   . Tetracyclines & Related Rash     Review of Systems:  ROS  Family history- Review and unchanged Social history- Review and unchanged Physical Exam: BP 112/64 mmHg  Pulse 82  Temp(Src) 97.3 F (36.3 C)  Resp 14  Ht 5\' 3"  (1.6 m)  Wt 117 lb (53.071 kg)  BMI 20.73 kg/m2  SpO2 98%  LMP 09/14/2014 Wt Readings from Last 3 Encounters:  10/23/15 117 lb (53.071 kg)  09/23/15 117 lb (53.071 kg)  09/18/15 118 lb (53.524 kg)   General Appearance: Well nourished, in no apparent distress. Eyes: PERRLA, EOMs, conjunctiva no swelling or  erythema Sinuses: No Frontal/maxillary tenderness ENT/Mouth: Ext aud canals clear, TMs without erythema, bulging. No erythema, swelling, or exudate on post pharynx.  Tonsils not swollen or erythematous. Hearing normal.  Neck: Supple, thyroid normal.  Respiratory: Respiratory effort normal, BS equal bilaterally without rales, rhonchi, wheezing or stridor.  Cardio: RRR with no MRGs. Brisk peripheral pulses without edema.  Abdomen: Soft, + BS,  Non tender, no guarding, rebound, hernias, masses. Lymphatics: Non tender without lymphadenopathy.  Musculoskeletal: Full ROM, 5/5 strength, Normal gait Skin: Warm, dry without rashes, lesions, ecchymosis.  Neuro: Cranial nerves intact. Normal muscle tone, no cerebellar symptoms. Psych: Awake and oriented X 3, normal affect, Insight and Judgment appropriate.    Vicie Mutters, PA-C 2:27 PM [facility]

## 2015-10-23 NOTE — Patient Instructions (Signed)
Hold zpak for 2-3 more days but if you are not getting better with the prednisone get on it.   Please pick one of the over the counter allergy medications below and take it once daily for allergies.  Claritin or loratadine cheapest but likely the weakest  Zyrtec or certizine at night because it can make you sleepy The strongest is allegra or fexafinadine  Cheapest at [employer], [employer], [employer]  I will give you a prescription for an antibiotic, but please only take it if you are not feeling better in 7-10 days.  Bronchitis is mostly caused by viruses and the antibiotic will do nothing.  PLEASE TRY TO DO OVER THE COUNTER TREATMENT AND/OR PREDNISONE FOR 5-7 DAYS AND IF YOU ARE NOT GETTING BETTER OR GETTING WORSE THEN YOU CAN START ON AN ANTIBIOTIC GIVEN.  Can take the prednisone AT NIGHT WITH DINNER, it take 8-12 hours to start working so it will NOT affect your sleeping if you take it at night with your food!! Take two pills the first night and 1 or two pill the second night and then 1 pill the other nights.    Rest and stay hydrated.  Make sure you drink plenty of fluids to make sure urine is clear when you urinate.  Water will help thin out mucous. - Take Mucinex DM- Maximum Strength over the counter to thin out and cough up the thick mucous.  Please follow directions on box. -Take Albuterol if prescribed.  Risk of antibiotic use: About 1 in 4 people who take antibiotics have side effects including stomach problems, dizziness, or rashes. Those problems clear up soon after stopping the drugs, but in rare cases antibiotics can cause severe allergic reaction. Over use of antibiotics also encourages the growth of bacteria that can't be controlled easily with drugs. That makes you more vunerable to antibiotic-resistant infections and undermines the benefits of antibiotics for others.   Waste of Money: Antibiotics often aren't very expensive, but any money spent on unnecessary drugs is money down the  drain.   When are antibiotics needed? Only when symptoms last longer than a week.  Start to improve but then worsen again  Please call the office or message through My Chart if you have any questions.   Acute Bronchitis Bronchitis is when the airways that extend from the windpipe into the lungs get red, puffy, and painful (inflamed). Bronchitis often causes thick spit (mucus) to develop. This leads to a cough. A cough is the most common symptom of bronchitis. In acute bronchitis, the condition usually begins suddenly and goes away over time (usually in 2 weeks). Smoking, allergies, and asthma can make bronchitis worse. Repeated episodes of bronchitis may cause more lung problems.  Most common cause of Bronchitis is viruses (rhinovirus, coronavirus, RSV).  Therefore, not requiring an antibiotic; as antibiotics only treat bacterial infections.  HOME CARE  Rest.  Drink enough fluids to keep your pee (urine) clear or pale yellow (unless you need to limit fluids as told by your doctor).  Only take over-the-counter or prescription medicines as told by your doctor.  Avoid smoking and secondhand smoke. These can make bronchitis worse. If you are a smoker, think about using nicotine gum or skin patches. Quitting smoking will help your lungs heal faster.  Reduce the chance of getting bronchitis again by:  Washing your hands often.  Avoiding people with cold symptoms.  Trying not to touch your hands to your mouth, nose, or eyes.  Follow up with your  doctor as told. GET HELP IF: Your symptoms do not improve after 1 week of treatment. Symptoms include:  Cough.  Fever.  Coughing up thick spit.  Body aches.  Chest congestion.  Chills.  Shortness of breath.  Sore throat. GET HELP RIGHT AWAY IF:   You have an increased fever.  You have chills.  You have severe shortness of breath.  You have bloody thick spit (sputum).  You throw up (vomit) often.  You lose too much body  fluid (dehydration).  You have a severe headache.  You faint. MAKE SURE YOU:   Understand these instructions.  Will watch your condition.  Will get help right away if you are not doing well or get worse. Document Released: 02/16/2008 Document Revised: 05/02/2013 Document Reviewed: 02/20/2013 [facility] Patient Information 2015 [place], [place]. This information is not intended to replace advice given to you by your health care provider. Make sure you discuss any questions you have with your health care provider.

## 2015-10-24 LAB — CBC WITH DIFFERENTIAL/PLATELET
BASOS ABS: 0 10*3/uL (ref 0.0–0.1)
Basophils Relative: 1 % (ref 0–1)
EOS PCT: 3 % (ref 0–5)
Eosinophils Absolute: 0.1 10*3/uL (ref 0.0–0.7)
HEMATOCRIT: 36.3 % (ref 36.0–46.0)
HEMOGLOBIN: 11.9 g/dL — AB (ref 12.0–15.0)
LYMPHS ABS: 0.4 10*3/uL — AB (ref 0.7–4.0)
LYMPHS PCT: 9 % — AB (ref 12–46)
MCH: 29.8 pg (ref 26.0–34.0)
MCHC: 32.8 g/dL (ref 30.0–36.0)
MCV: 91 fL (ref 78.0–100.0)
MPV: 9.3 fL (ref 8.6–12.4)
Monocytes Absolute: 0.5 10*3/uL (ref 0.1–1.0)
Monocytes Relative: 11 % (ref 3–12)
NEUTROS ABS: 3.4 10*3/uL (ref 1.7–7.7)
NEUTROS PCT: 76 % (ref 43–77)
Platelets: 230 10*3/uL (ref 150–400)
RBC: 3.99 MIL/uL (ref 3.87–5.11)
RDW: 14.8 % (ref 11.5–15.5)
WBC: 4.5 10*3/uL (ref 4.0–10.5)

## 2015-10-24 LAB — HEMOGLOBIN A1C
Hgb A1c MFr Bld: 5.8 % — ABNORMAL HIGH (ref <5.7)
Mean Plasma Glucose: 120 mg/dL — ABNORMAL HIGH (ref <117)

## 2015-11-06 ENCOUNTER — Encounter: Payer: Self-pay | Admitting: Hematology & Oncology

## 2015-11-24 ENCOUNTER — Ambulatory Visit (HOSPITAL_BASED_OUTPATIENT_CLINIC_OR_DEPARTMENT_OTHER): Payer: Medicare Other | Admitting: Hematology & Oncology

## 2015-11-24 ENCOUNTER — Other Ambulatory Visit (HOSPITAL_BASED_OUTPATIENT_CLINIC_OR_DEPARTMENT_OTHER): Payer: Medicare Other

## 2015-11-24 ENCOUNTER — Encounter: Payer: Self-pay | Admitting: Hematology & Oncology

## 2015-11-24 ENCOUNTER — Ambulatory Visit (HOSPITAL_BASED_OUTPATIENT_CLINIC_OR_DEPARTMENT_OTHER): Payer: Medicare Other

## 2015-11-24 VITALS — BP 97/68 | HR 80 | Temp 98.2°F | Resp 18 | Wt 116.0 lb

## 2015-11-24 VITALS — BP 106/53 | HR 67 | Temp 97.8°F

## 2015-11-24 DIAGNOSIS — C8310 Mantle cell lymphoma, unspecified site: Secondary | ICD-10-CM

## 2015-11-24 DIAGNOSIS — C8315 Mantle cell lymphoma, lymph nodes of inguinal region and lower limb: Secondary | ICD-10-CM

## 2015-11-24 DIAGNOSIS — Z5112 Encounter for antineoplastic immunotherapy: Secondary | ICD-10-CM

## 2015-11-24 DIAGNOSIS — M810 Age-related osteoporosis without current pathological fracture: Secondary | ICD-10-CM

## 2015-11-24 DIAGNOSIS — C8313 Mantle cell lymphoma, intra-abdominal lymph nodes: Secondary | ICD-10-CM

## 2015-11-24 LAB — CBC WITH DIFFERENTIAL (CANCER CENTER ONLY)
BASO#: 0.1 10*3/uL (ref 0.0–0.2)
BASO%: 1.1 % (ref 0.0–2.0)
EOS ABS: 0.1 10*3/uL (ref 0.0–0.5)
EOS%: 1.6 % (ref 0.0–7.0)
HCT: 39.1 % (ref 34.8–46.6)
HGB: 13.3 g/dL (ref 11.6–15.9)
LYMPH#: 0.4 10*3/uL — ABNORMAL LOW (ref 0.9–3.3)
LYMPH%: 8 % — AB (ref 14.0–48.0)
MCH: 30.6 pg (ref 26.0–34.0)
MCHC: 34 g/dL (ref 32.0–36.0)
MCV: 90 fL (ref 81–101)
MONO#: 0.4 10*3/uL (ref 0.1–0.9)
MONO%: 9 % (ref 0.0–13.0)
NEUT#: 3.5 10*3/uL (ref 1.5–6.5)
NEUT%: 80.3 % — AB (ref 39.6–80.0)
PLATELETS: 218 10*3/uL (ref 145–400)
RBC: 4.34 10*6/uL (ref 3.70–5.32)
RDW: 14.3 % (ref 11.1–15.7)
WBC: 4.4 10*3/uL (ref 3.9–10.0)

## 2015-11-24 LAB — CMP (CANCER CENTER ONLY)
ALK PHOS: 92 U/L — AB (ref 26–84)
ALT: 23 U/L (ref 10–47)
AST: 30 U/L (ref 11–38)
Albumin: 3.8 g/dL (ref 3.3–5.5)
BUN: 16 mg/dL (ref 7–22)
CALCIUM: 9.5 mg/dL (ref 8.0–10.3)
CO2: 30 meq/L (ref 18–33)
Chloride: 102 mEq/L (ref 98–108)
Creat: 0.9 mg/dl (ref 0.6–1.2)
GLUCOSE: 104 mg/dL (ref 73–118)
POTASSIUM: 4 meq/L (ref 3.3–4.7)
Sodium: 139 mEq/L (ref 128–145)
Total Bilirubin: 0.8 mg/dl (ref 0.20–1.60)
Total Protein: 6.4 g/dL (ref 6.4–8.1)

## 2015-11-24 LAB — LACTATE DEHYDROGENASE: LDH: 200 U/L (ref 125–245)

## 2015-11-24 LAB — TECHNOLOGIST REVIEW CHCC SATELLITE

## 2015-11-24 MED ORDER — DIPHENHYDRAMINE HCL 25 MG PO CAPS
ORAL_CAPSULE | ORAL | Status: AC
  Filled 2015-11-24: qty 2

## 2015-11-24 MED ORDER — SODIUM CHLORIDE 0.9 % IV SOLN
375.0000 mg/m2 | Freq: Once | INTRAVENOUS | Status: AC
  Administered 2015-11-24: 600 mg via INTRAVENOUS
  Filled 2015-11-24: qty 60

## 2015-11-24 MED ORDER — ACETAMINOPHEN 325 MG PO TABS
ORAL_TABLET | ORAL | Status: AC
  Filled 2015-11-24: qty 2

## 2015-11-24 MED ORDER — DIPHENHYDRAMINE HCL 25 MG PO CAPS
50.0000 mg | ORAL_CAPSULE | Freq: Once | ORAL | Status: AC
  Administered 2015-11-24: 50 mg via ORAL

## 2015-11-24 MED ORDER — SODIUM CHLORIDE 0.9 % IJ SOLN
10.0000 mL | INTRAMUSCULAR | Status: DC | PRN
  Administered 2015-11-24: 10 mL
  Filled 2015-11-24: qty 10

## 2015-11-24 MED ORDER — HEPARIN SOD (PORK) LOCK FLUSH 100 UNIT/ML IV SOLN
500.0000 [IU] | Freq: Once | INTRAVENOUS | Status: AC | PRN
  Administered 2015-11-24: 500 [IU]
  Filled 2015-11-24: qty 5

## 2015-11-24 MED ORDER — ACETAMINOPHEN 325 MG PO TABS
650.0000 mg | ORAL_TABLET | Freq: Once | ORAL | Status: AC
  Administered 2015-11-24: 650 mg via ORAL

## 2015-11-24 MED ORDER — SODIUM CHLORIDE 0.9 % IV SOLN
Freq: Once | INTRAVENOUS | Status: AC
  Administered 2015-11-24: 11:00:00 via INTRAVENOUS

## 2015-11-24 NOTE — Patient Instructions (Signed)
[facility] Discharge Instructions for Patients Receiving Chemotherapy  Today you received the following chemotherapy agents Rituxan  To help prevent nausea and vomiting after your treatment, we encourage you to take your nausea medication    If you develop nausea and vomiting that is not controlled by your nausea medication, call the clinic.   BELOW ARE SYMPTOMS THAT SHOULD BE REPORTED IMMEDIATELY:  *FEVER GREATER THAN 100.5 F  *CHILLS WITH OR WITHOUT FEVER  NAUSEA AND VOMITING THAT IS NOT CONTROLLED WITH YOUR NAUSEA MEDICATION  *UNUSUAL SHORTNESS OF BREATH  *UNUSUAL BRUISING OR BLEEDING  TENDERNESS IN MOUTH AND THROAT WITH OR WITHOUT PRESENCE OF ULCERS  *URINARY PROBLEMS  *BOWEL PROBLEMS  UNUSUAL RASH Items with * indicate a potential emergency and should be followed up as soon as possible.  Feel free to call the clinic you have any questions or concerns. The clinic phone number is [phone].  Please show the CHEMO ALERT CARD at check-in to the Emergency Department and triage nurse.   

## 2015-11-24 NOTE — Progress Notes (Signed)
Hematology and Oncology Follow Up Visit  Shannon Parsons [id number] 07-16-1941 75 y.o. 11/24/2015   Principle Diagnosis:   Mantle cell lymphoma (MCIPI = 5)  Thromboembolic event of the right leg  Current Therapy:    Rituxan - maintanence - s/p cycle #2     Interim History:  Ms.  Shannon Parsons is back for follow-up.it sounds like she may have had some food poisoning. She says she had vomiting last night. It was self-limiting. She thinks it was him that she ate.  Otherwise, she is doing pretty well. She's had no pain or swelling in the right leg. She's had no change in bowel or bladder habits. She's had no fever. There's been no rashes.  Her hair is coming back slowly. She wishes it would come back more quickly.  She's not noted any lymph glands.  She's had no issues with headaches.  Overall, her performance status is ECOG 1   Medications:  Current outpatient prescriptions:  .  acetaminophen (TYLENOL) 325 MG tablet, Take 325 mg by mouth every 6 (six) hours as needed (For pain.). , Disp: , Rfl:  .  albuterol (PROVENTIL HFA;VENTOLIN HFA) 108 (90 BASE) MCG/ACT inhaler, Inhale 2 puffs into the lungs every 6 (six) hours as needed for wheezing or shortness of breath (cough)., Disp: 1 Inhaler, Rfl: 0 .  albuterol (PROVENTIL) (2.5 MG/3ML) 0.083% nebulizer solution, Take 3 mLs (2.5 mg total) by nebulization every 4 (four) hours as needed for wheezing or shortness of breath., Disp: 30 vial, Rfl: 0 .  ALPRAZolam (XANAX) 0.5 MG tablet, Take 0.5 mg by mouth at bedtime as needed for anxiety., Disp: , Rfl:  .  Cholecalciferol (VITAMIN D) 2000 UNITS CAPS, Take 2,000-4,000 capsules by mouth every other day. Patient alternates 1 cap with 2 caps every other day., Disp: , Rfl:  .  hyoscyamine (LEVSIN, ANASPAZ) 0.125 MG tablet, Take 1 to 2 tablets every 4 hours as needed for nausea, cramping, bloating or diarrhea, Disp: 90 tablet, Rfl: 11 .  levothyroxine (SYNTHROID, LEVOTHROID) 50 MCG tablet, TAKE 1  TABLET BY MOUTH ONCE DAILY., Disp: 90 tablet, Rfl: 3 .  lidocaine-prilocaine (EMLA) cream, Apply 1 application topically as needed. Apply to [facility] one hour before access., Disp: 30 g, Rfl: 3 .  Multiple Vitamin (MULTIVITAMIN WITH MINERALS) TABS tablet, Take 1 tablet by mouth daily., Disp: , Rfl:  .  Probiotic Product (PROBIOTIC DAILY PO), Take 1 capsule by mouth daily., Disp: , Rfl:  .  vitamin B-12 (CYANOCOBALAMIN) 1000 MCG tablet, Take 1,000 mcg by mouth daily., Disp: , Rfl:   Allergies:  Allergies  Allergen Reactions  . Actonel [Risedronate Sodium] Other (See Comments)    "made me choke"   . Fosamax [Alendronate] Other (See Comments)    "made me choke"  . Sulfa Antibiotics Other (See Comments)    Doesn't remember   . Tetracyclines & Related Rash    Past Medical History, Surgical history, Social history, and Family History were reviewed and updated.  Review of Systems: As above  Physical Exam:  weight is 116 lb (52.617 kg). Her oral temperature is 98.2 F (36.8 C). Her blood pressure is 97/68 and her pulse is 80. Her respiration is 18.   Thin but fairly well-nourished white female. Head and neck exam shows no ocular or oral lesions. She has no bilateral cervical or supraclavicular lymph nodes. She has no palpable thyroid. Lungs are clear. Cardiac exam regular rate and rhythm with no murmurs, rubs or bruits. Axillary exam shows no  obvious bilateral axillary adenopathy. Abdomen is soft. She has decent bowel sounds. There is no fluid wave. Her spleen tip is just below the left costal margin. There is no hepatomegaly. Back exam shows no tenderness over the spine, ribs or hips. Extremities shows no clubbing, cyanosis or edema. No palpable venous cord is noted in the right leg. She has good range of motion of her joints. She has good strength in her muscles. Neurological exam is nonfocal. Skin exam shows no rashes, ecchymoses or petechia  Lab Results  Component Value Date   WBC 4.4  11/24/2015   HGB 13.3 11/24/2015   HCT 39.1 11/24/2015   MCV 90 11/24/2015   PLT 218 11/24/2015     Chemistry      Component Value Date/Time   NA 135 09/23/2015 0817   NA 138 08/01/2015 1124   NA 135 04/02/2015 1511   K 3.6 09/23/2015 0817   K 4.0 08/01/2015 1124   K 3.7 04/02/2015 1511   CL 101 09/23/2015 0817   CL 102 04/02/2015 1511   CO2 27 09/23/2015 0817   CO2 28 08/01/2015 1124   CO2 22 04/02/2015 1511   BUN 13 09/23/2015 0817   BUN 10.4 08/01/2015 1124   BUN 16 04/02/2015 1511   CREATININE 0.6 09/23/2015 0817   CREATININE 0.8 08/01/2015 1124   CREATININE 0.64 02/22/2015 0405      Component Value Date/Time   CALCIUM 8.9 09/23/2015 0817   CALCIUM 9.6 08/01/2015 1124   CALCIUM 8.9 04/02/2015 1511   ALKPHOS 78 09/23/2015 0817   ALKPHOS 88 08/01/2015 1124   ALKPHOS 48 04/02/2015 1511   AST 24 09/23/2015 0817   AST 23 08/01/2015 1124   AST 19 04/02/2015 1511   ALT 18 09/23/2015 0817   ALT 12 08/01/2015 1124   ALT 11 04/02/2015 1511   BILITOT 0.70 09/23/2015 0817   BILITOT <0.30 08/01/2015 1124   BILITOT 0.3 04/02/2015 1511         Impression and Plan: [name] is 75 year old white female. She has mantle cell lymphoma. Again, by the [facility] she is an intermediate risk.  She has responded very well. Again, we have to watch out for recurrence. She is at risk for recurrence.  We will continue her on the maintenance Rituxan. TheWill be her third cycle treatment.   We'll plan to get her back in 2 months.  I did not see a need for any scans right now.  I spent about 35 minutes with she and her husband this morning.  Volanda Napoleon, MD 3/13/201710:18 AM

## 2015-11-25 ENCOUNTER — Telehealth: Payer: Self-pay | Admitting: *Deleted

## 2015-11-25 LAB — VITAMIN D 25 HYDROXY (VIT D DEFICIENCY, FRACTURES): Vitamin D, 25-Hydroxy: 56.4 ng/mL (ref 30.0–100.0)

## 2015-11-25 NOTE — Telephone Encounter (Addendum)
Patient aware of results  ----- Message from Volanda Napoleon, MD sent at 11/25/2015  6:45 AM EDT ----- Call - vit D level is fantastic!!!  Shannon Parsons

## 2015-12-29 ENCOUNTER — Other Ambulatory Visit: Payer: Self-pay | Admitting: Internal Medicine

## 2016-01-15 ENCOUNTER — Ambulatory Visit (INDEPENDENT_AMBULATORY_CARE_PROVIDER_SITE_OTHER): Payer: Medicare Other | Admitting: Internal Medicine

## 2016-01-15 VITALS — BP 122/78 | HR 88 | Temp 98.2°F | Resp 16 | Ht 63.0 in | Wt 118.0 lb

## 2016-01-15 DIAGNOSIS — J069 Acute upper respiratory infection, unspecified: Secondary | ICD-10-CM | POA: Diagnosis not present

## 2016-01-15 MED ORDER — PREDNISONE 20 MG PO TABS: ORAL_TABLET | ORAL | Status: DC

## 2016-01-15 MED ORDER — PROMETHAZINE-DM 6.25-15 MG/5ML PO SYRP
ORAL_SOLUTION | ORAL | Status: DC
Start: 2016-01-15 — End: 2016-09-10

## 2016-01-15 MED ORDER — AMOXICILLIN-POT CLAVULANATE 875-125 MG PO TABS: 1.0000 | ORAL_TABLET | Freq: Two times a day (BID) | ORAL | Status: DC

## 2016-01-15 NOTE — Progress Notes (Signed)
HPI  Patient presents to the office for evaluation of nasal congestion and coughing.  It has been going on for 3 weeks.  Patient reports wet, barky, cough that only happens with a lot of nasal drainage.   She does bring up yellow sputum.  She is using nebulizers twice daily which help to bring up congestion.  They also endorse change in voice, postnasal drip, shortness of breath, sputum production, wheezing and nasal congestion, clear rhinorrhea..  They have tried antitussives or prednisone.  They report that nothing has worked.  They denies other sick contacts.  She does typically get bad seasonal allergies.    Review of Systems  Constitutional: Negative for fever, chills and malaise/fatigue.  HENT: Positive for congestion. Negative for ear pain and sore throat.   Respiratory: Positive for cough, sputum production, shortness of breath and wheezing.   Cardiovascular: Negative for chest pain, palpitations and leg swelling.  Skin: Negative.   Neurological: Negative for headaches.    PE:  Filed Vitals:   01/15/16 0952  BP: 122/78  Pulse: 88  Temp: 98.2 F (36.8 C)  Resp: 16    General:  Alert and non-toxic, WDWN, NAD HEENT: NCAT, PERLA, EOM normal, no occular discharge or erythema.  Nasal mucosal edema with sinus tenderness to palpation.  Oropharynx clear with minimal oropharyngeal edema and erythema.  Mucous membranes moist and pink. Neck:  Cervical adenopathy Chest:  RRR no MRGs.  Lungs with some rhonchi in the right upper lobe with scattered wheeze.    Abdomen: +BS x 4 quadrants, soft, non-tender, no guarding, rigidity, or rebound. Skin: warm and dry no rash Neuro: A&Ox4, CN II-XII grossly intact  Assessment and Plan:   1. Acute URI -prednisone -albuterol q6hrs prn -symbicort sample 1 puff BID -augmentin -phenergan dm -zyrtec -nasal saline as often as tolerated.

## 2016-01-15 NOTE — Patient Instructions (Addendum)
Please take the augmentin twice daily with food.  Take it until it is gone.  If you get diarrhea while taking it please call me.  Please use saline in your nose as often as you can tolerate to help rinse your nose out.  Please use symbicort 1 inhalation in the morning and 1 inhalation in the evening.  Please take zyrtec nightly at bedtime.  Please use cough syrup as needed to help dry up congestion and to help with coughing.  Please take prednisone as directed until it is gone.  Please use your nebulizer up to every 6 hours as needed for chest tightness and wheezing.  Please call the office if you have any worsening in shortness of breath or any other concerning symptoms.     I will send a message to Dr. Marin Olp.

## 2016-01-21 ENCOUNTER — Ambulatory Visit: Payer: Self-pay | Admitting: Internal Medicine

## 2016-01-27 ENCOUNTER — Ambulatory Visit (HOSPITAL_BASED_OUTPATIENT_CLINIC_OR_DEPARTMENT_OTHER): Payer: Medicare Other

## 2016-01-27 ENCOUNTER — Other Ambulatory Visit (HOSPITAL_BASED_OUTPATIENT_CLINIC_OR_DEPARTMENT_OTHER): Payer: Medicare Other

## 2016-01-27 ENCOUNTER — Ambulatory Visit (HOSPITAL_BASED_OUTPATIENT_CLINIC_OR_DEPARTMENT_OTHER): Payer: Medicare Other | Admitting: Hematology & Oncology

## 2016-01-27 ENCOUNTER — Encounter: Payer: Self-pay | Admitting: Hematology & Oncology

## 2016-01-27 VITALS — BP 98/64 | HR 80 | Temp 98.0°F | Resp 16

## 2016-01-27 VITALS — BP 114/54 | HR 79 | Temp 98.1°F | Resp 16 | Ht 63.0 in | Wt 118.0 lb

## 2016-01-27 DIAGNOSIS — Z5112 Encounter for antineoplastic immunotherapy: Secondary | ICD-10-CM | POA: Diagnosis not present

## 2016-01-27 DIAGNOSIS — C8310 Mantle cell lymphoma, unspecified site: Secondary | ICD-10-CM

## 2016-01-27 DIAGNOSIS — C8313 Mantle cell lymphoma, intra-abdominal lymph nodes: Secondary | ICD-10-CM

## 2016-01-27 DIAGNOSIS — L502 Urticaria due to cold and heat: Secondary | ICD-10-CM

## 2016-01-27 LAB — CBC WITH DIFFERENTIAL (CANCER CENTER ONLY)
BASO#: 0.1 10*3/uL (ref 0.0–0.2)
BASO%: 1.9 % (ref 0.0–2.0)
EOS%: 1.9 % (ref 0.0–7.0)
Eosinophils Absolute: 0.1 10*3/uL (ref 0.0–0.5)
HEMATOCRIT: 39.1 % (ref 34.8–46.6)
HGB: 13.4 g/dL (ref 11.6–15.9)
LYMPH#: 0.5 10*3/uL — AB (ref 0.9–3.3)
LYMPH%: 8 % — AB (ref 14.0–48.0)
MCH: 30.9 pg (ref 26.0–34.0)
MCHC: 34.3 g/dL (ref 32.0–36.0)
MCV: 90 fL (ref 81–101)
MONO#: 0.7 10*3/uL (ref 0.1–0.9)
MONO%: 11.6 % (ref 0.0–13.0)
NEUT#: 4.8 10*3/uL (ref 1.5–6.5)
NEUT%: 76.6 % (ref 39.6–80.0)
Platelets: 170 10*3/uL (ref 145–400)
RBC: 4.33 10*6/uL (ref 3.70–5.32)
RDW: 14.6 % (ref 11.1–15.7)
WBC: 6.2 10*3/uL (ref 3.9–10.0)

## 2016-01-27 LAB — CMP (CANCER CENTER ONLY)
ALK PHOS: 68 U/L (ref 26–84)
ALT(SGPT): 18 U/L (ref 10–47)
AST: 27 U/L (ref 11–38)
Albumin: 3.2 g/dL — ABNORMAL LOW (ref 3.3–5.5)
BUN, Bld: 13 mg/dL (ref 7–22)
CALCIUM: 9.5 mg/dL (ref 8.0–10.3)
CHLORIDE: 102 meq/L (ref 98–108)
CO2: 26 meq/L (ref 18–33)
Creat: 0.9 mg/dl (ref 0.6–1.2)
GLUCOSE: 95 mg/dL (ref 73–118)
POTASSIUM: 4.3 meq/L (ref 3.3–4.7)
Sodium: 136 mEq/L (ref 128–145)
Total Bilirubin: 0.8 mg/dl (ref 0.20–1.60)
Total Protein: 5.7 g/dL — ABNORMAL LOW (ref 6.4–8.1)

## 2016-01-27 LAB — TECHNOLOGIST REVIEW CHCC SATELLITE

## 2016-01-27 LAB — LACTATE DEHYDROGENASE: LDH: 263 U/L — AB (ref 125–245)

## 2016-01-27 MED ORDER — DIPHENHYDRAMINE HCL 25 MG PO CAPS
50.0000 mg | ORAL_CAPSULE | Freq: Once | ORAL | Status: AC
  Administered 2016-01-27: 50 mg via ORAL

## 2016-01-27 MED ORDER — SODIUM CHLORIDE 0.9 % IJ SOLN
10.0000 mL | INTRAMUSCULAR | Status: DC | PRN
  Administered 2016-01-27: 10 mL
  Filled 2016-01-27: qty 10

## 2016-01-27 MED ORDER — HEPARIN SOD (PORK) LOCK FLUSH 100 UNIT/ML IV SOLN
500.0000 [IU] | Freq: Once | INTRAVENOUS | Status: AC | PRN
  Administered 2016-01-27: 500 [IU]
  Filled 2016-01-27: qty 5

## 2016-01-27 MED ORDER — SODIUM CHLORIDE 0.9 % IV SOLN
Freq: Once | INTRAVENOUS | Status: AC
  Administered 2016-01-27: 10:00:00 via INTRAVENOUS

## 2016-01-27 MED ORDER — SODIUM CHLORIDE 0.9 % IJ SOLN
3.0000 mL | INTRAMUSCULAR | Status: DC | PRN
  Filled 2016-01-27: qty 10

## 2016-01-27 MED ORDER — ACETAMINOPHEN 325 MG PO TABS
650.0000 mg | ORAL_TABLET | Freq: Once | ORAL | Status: AC
  Administered 2016-01-27: 650 mg via ORAL

## 2016-01-27 MED ORDER — DIPHENHYDRAMINE HCL 25 MG PO CAPS
ORAL_CAPSULE | ORAL | Status: AC
  Filled 2016-01-27: qty 2

## 2016-01-27 MED ORDER — LORATADINE 10 MG PO TABS: 10.0000 mg | ORAL_TABLET | Freq: Every day | ORAL | Status: DC

## 2016-01-27 MED ORDER — ACETAMINOPHEN 325 MG PO TABS
ORAL_TABLET | ORAL | Status: AC
Start: 2016-01-27 — End: 2016-01-27
  Filled 2016-01-27: qty 2

## 2016-01-27 MED ORDER — RITUXIMAB CHEMO INJECTION 500 MG/50ML
375.0000 mg/m2 | Freq: Once | INTRAVENOUS | Status: AC
  Administered 2016-01-27: 600 mg via INTRAVENOUS
  Filled 2016-01-27: qty 50

## 2016-01-27 MED ORDER — ALTEPLASE 2 MG IJ SOLR
2.0000 mg | Freq: Once | INTRAMUSCULAR | Status: DC | PRN
  Filled 2016-01-27: qty 2

## 2016-01-27 MED ORDER — AMOXICILLIN 500 MG PO CAPS: ORAL_CAPSULE | ORAL | Status: DC

## 2016-01-27 MED ORDER — HEPARIN SOD (PORK) LOCK FLUSH 100 UNIT/ML IV SOLN
250.0000 [IU] | Freq: Once | INTRAVENOUS | Status: DC | PRN
  Filled 2016-01-27: qty 5

## 2016-01-27 NOTE — Patient Instructions (Signed)
Rituximab injection What is this medicine? RITUXIMAB (ri TUX i mab) is a monoclonal antibody. It is used commonly to treat non-Hodgkin lymphoma and other conditions. It is also used to treat rheumatoid arthritis (RA). In RA, this medicine slows the inflammatory process and help reduce joint pain and swelling. This medicine is often used with other cancer or arthritis medications. This medicine may be used for other purposes; ask your health care provider or pharmacist if you have questions. What should I tell my health care provider before I take this medicine? They need to know if you have any of these conditions: -blood disorders -heart disease -history of hepatitis B -infection (especially a virus infection such as chickenpox, cold sores, or herpes) -irregular heartbeat -kidney disease -lung or breathing disease, like asthma -lupus -an unusual or allergic reaction to rituximab, mouse proteins, other medicines, foods, dyes, or preservatives -pregnant or trying to get pregnant -breast-feeding How should I use this medicine? This medicine is for infusion into a vein. It is administered in a hospital or clinic by a specially trained health care professional. A special MedGuide will be given to you by the pharmacist with each prescription and refill. Be sure to read this information carefully each time. Talk to your pediatrician regarding the use of this medicine in children. This medicine is not approved for use in children. Overdosage: If you think you have taken too much of this medicine contact a poison control center or emergency room at once. NOTE: This medicine is only for you. Do not share this medicine with others. What if I miss a dose? It is important not to miss a dose. Call your doctor or health care professional if you are unable to keep an appointment. What may interact with this medicine? -cisplatin -medicines for blood pressure -some other medicines for  arthritis -vaccines This list may not describe all possible interactions. Give your health care provider a list of all the medicines, herbs, non-prescription drugs, or dietary supplements you use. Also tell them if you smoke, drink alcohol, or use illegal drugs. Some items may interact with your medicine. What should I watch for while using this medicine? Report any side effects that you notice during your treatment right away, such as changes in your breathing, fever, chills, dizziness or lightheadedness. These effects are more common with the first dose. Visit your prescriber or health care professional for checks on your progress. You will need to have regular blood work. Report any other side effects. The side effects of this medicine can continue after you finish your treatment. Continue your course of treatment even though you feel ill unless your doctor tells you to stop. Call your doctor or health care professional for advice if you get a fever, chills or sore throat, or other symptoms of a cold or flu. Do not treat yourself. This drug decreases your body's ability to fight infections. Try to avoid being around people who are sick. This medicine may increase your risk to bruise or bleed. Call your doctor or health care professional if you notice any unusual bleeding. Be careful brushing and flossing your teeth or using a toothpick because you may get an infection or bleed more easily. If you have any dental work done, tell your dentist you are receiving this medicine. Avoid taking products that contain aspirin, acetaminophen, ibuprofen, naproxen, or ketoprofen unless instructed by your doctor. These medicines may hide a fever. Do not become pregnant while taking this medicine. Women should inform their doctor if   they wish to become pregnant or think they might be pregnant. There is a potential for serious side effects to an unborn child. Talk to your health care professional or pharmacist for more  information. Do not breast-feed an infant while taking this medicine. What side effects may I notice from receiving this medicine? Side effects that you should report to your doctor or health care professional as soon as possible: -allergic reactions like skin rash, itching or hives, swelling of the face, lips, or tongue -low blood counts - this medicine may decrease the number of white blood cells, red blood cells and platelets. You may be at increased risk for infections and bleeding. -signs of infection - fever or chills, cough, sore throat, pain or difficulty passing urine -signs of decreased platelets or bleeding - bruising, pinpoint red spots on the skin, black, tarry stools, blood in the urine -signs of decreased red blood cells - unusually weak or tired, fainting spells, lightheadedness -breathing problems -confused, not responsive -chest pain -fast, irregular heartbeat -feeling faint or lightheaded, falls -mouth sores -redness, blistering, peeling or loosening of the skin, including inside the mouth -stomach pain -swelling of the ankles, feet, or hands -trouble passing urine or change in the amount of urine Side effects that usually do not require medical attention (report to your doctor or other health care professional if they continue or are bothersome): -anxiety -headache -loss of appetite -muscle aches -nausea -night sweats This list may not describe all possible side effects. Call your doctor for medical advice about side effects. You may report side effects to FDA at 1-800-FDA-1088. Where should I keep my medicine? This drug is given in a hospital or clinic and will not be stored at home. NOTE: This sheet is a summary. It may not cover all possible information. If you have questions about this medicine, talk to your doctor, pharmacist, or health care provider.    2016, Elsevier/Gold Standard. (2014-11-06 22:30:56)  

## 2016-01-27 NOTE — Progress Notes (Signed)
Hematology and Oncology Follow Up Visit  Shannon Parsons [id number] 13-Jun-1941 75 y.o. 01/27/2016   Principle Diagnosis:   Mantle cell lymphoma (MCIPI = 5)  Thromboembolic event of the right leg  Current Therapy:    Rituxan - maintanence - s/p cycle #3     Interim History:  Shannon Parsons is back for follow-up.i  her problem now that she has some issues with sinus congestion. She's been on several antibiotics. She's had a little bit of a cough. She's had no x-rays done area and she's had no fever.  We'll try her on some Claritin to see this helps.  She's had no nausea or vomiting. She is eating okay. She's had no diarrhea.  There's been no rashes.  She still has a tremor area and I told her that the neurologist pontes to sort this out.  She has had no bleeding. There's been no bruising.  Overall, her performance status is ECOG 1.  Medications:  Current outpatient prescriptions:  .  acetaminophen (TYLENOL) 325 MG tablet, Take 325 mg by mouth every 6 (six) hours as needed (For pain.). , Disp: , Rfl:  .  albuterol (PROVENTIL HFA;VENTOLIN HFA) 108 (90 BASE) MCG/ACT inhaler, Inhale 2 puffs into the lungs every 6 (six) hours as needed for wheezing or shortness of breath (cough)., Disp: 1 Inhaler, Rfl: 0 .  albuterol (PROVENTIL) (2.5 MG/3ML) 0.083% nebulizer solution, Take 3 mLs (2.5 mg total) by nebulization every 4 (four) hours as needed for wheezing or shortness of breath., Disp: 30 vial, Rfl: 0 .  ALPRAZolam (XANAX) 0.5 MG tablet, Take 0.5 mg by mouth at bedtime as needed for anxiety., Disp: , Rfl:  .  Cholecalciferol (VITAMIN D) 2000 UNITS CAPS, Take 2,000-4,000 capsules by mouth every other day. Patient alternates 1 cap with 2 caps every other day., Disp: , Rfl:  .  hyoscyamine (LEVSIN, ANASPAZ) 0.125 MG tablet, Take 1 to 2 tablets every 4 hours as needed for nausea, cramping, bloating or diarrhea, Disp: 90 tablet, Rfl: 11 .  levothyroxine (SYNTHROID, LEVOTHROID) 50 MCG tablet,  TAKE 1 TABLET BY MOUTH ONCE DAILY., Disp: 90 tablet, Rfl: 3 .  lidocaine-prilocaine (EMLA) cream, Apply 1 application topically as needed. Apply to [facility] one hour before access., Disp: 30 g, Rfl: 3 .  Multiple Vitamin (MULTIVITAMIN WITH MINERALS) TABS tablet, Take 1 tablet by mouth daily., Disp: , Rfl:  .  Probiotic Product (PROBIOTIC DAILY PO), Take 1 capsule by mouth daily., Disp: , Rfl:  .  promethazine-dextromethorphan (PROMETHAZINE-DM) 6.25-15 MG/5ML syrup, Take 5-10 mL PO q8hrs prn for cold symptoms, Disp: 360 mL, Rfl: 1 .  vitamin B-12 (CYANOCOBALAMIN) 1000 MCG tablet, Take 1,000 mcg by mouth daily., Disp: , Rfl:   Allergies:  Allergies  Allergen Reactions  . Actonel [Risedronate Sodium] Other (See Comments)    "made me choke"   . Fosamax [Alendronate] Other (See Comments)    "made me choke"  . Sulfa Antibiotics Other (See Comments)    Doesn't remember   . Tetracyclines & Related Rash    Past Medical History, Surgical history, Social history, and Family History were reviewed and updated.  Review of Systems: As above  Physical Exam:  height is 5\' 3"  (1.6 m) and weight is 118 lb (53.524 kg). Her oral temperature is 98.1 F (36.7 C). Her blood pressure is 114/54 and her pulse is 79. Her respiration is 16.   Thin but fairly well-nourished white female. Head and neck exam shows no ocular or oral lesions.  She has no bilateral cervical or supraclavicular lymph nodes. She has no palpable thyroid. Lungs are clear. Cardiac exam regular rate and rhythm with no murmurs, rubs or bruits. Axillary exam shows no obvious bilateral axillary adenopathy. Abdomen is soft. She has decent bowel sounds. There is no fluid wave. Her spleen tip is just below the left costal margin. There is no hepatomegaly. Back exam shows no tenderness over the spine, ribs or hips. Extremities shows no clubbing, cyanosis or edema. No palpable venous cord is noted in the right leg. She has good range of motion of her joints.  She has good strength in her muscles. Neurological exam is nonfocal. Skin exam shows no rashes, ecchymoses or petechia  Lab Results  Component Value Date   WBC 6.2 01/27/2016   HGB 13.4 01/27/2016   HCT 39.1 01/27/2016   MCV 90 01/27/2016   PLT 170 01/27/2016     Chemistry      Component Value Date/Time   NA 136 01/27/2016 0900   NA 138 08/01/2015 1124   NA 135 04/02/2015 1511   K 4.3 01/27/2016 0900   K 4.0 08/01/2015 1124   K 3.7 04/02/2015 1511   CL 102 01/27/2016 0900   CL 102 04/02/2015 1511   CO2 26 01/27/2016 0900   CO2 28 08/01/2015 1124   CO2 22 04/02/2015 1511   BUN 13 01/27/2016 0900   BUN 10.4 08/01/2015 1124   BUN 16 04/02/2015 1511   CREATININE 0.9 01/27/2016 0900   CREATININE 0.8 08/01/2015 1124   CREATININE 0.64 02/22/2015 0405      Component Value Date/Time   CALCIUM 9.5 01/27/2016 0900   CALCIUM 9.6 08/01/2015 1124   CALCIUM 8.9 04/02/2015 1511   ALKPHOS 68 01/27/2016 0900   ALKPHOS 88 08/01/2015 1124   ALKPHOS 48 04/02/2015 1511   AST 27 01/27/2016 0900   AST 23 08/01/2015 1124   AST 19 04/02/2015 1511   ALT 18 01/27/2016 0900   ALT 12 08/01/2015 1124   ALT 11 04/02/2015 1511   BILITOT 0.80 01/27/2016 0900   BILITOT <0.30 08/01/2015 1124   BILITOT 0.3 04/02/2015 1511         Impression and Plan: Shannon Parsons is 75 year old white female. She has mantle cell lymphoma. Again, by the [facility] she is an intermediate risk.  She has responded very well. Again, we have to watch out for recurrence. She is at risk for recurrence.  We will continue her on the maintenance Rituxan. This will be her fourth cycle treatment.   We'll plan to get her back in 2 months.  I did not see a need for any scans right now.  I spent about 35 minutes with she and her husband this morning.  Volanda Napoleon, MD 5/16/201710:06 AM

## 2016-02-03 ENCOUNTER — Ambulatory Visit (HOSPITAL_COMMUNITY)
Admission: RE | Admit: 2016-02-03 | Discharge: 2016-02-03 | Disposition: A | Discharge status: Home / Self Care | Payer: Medicare Other | Source: Ambulatory Visit | Attending: Physician Assistant | Admitting: Physician Assistant

## 2016-02-03 ENCOUNTER — Ambulatory Visit (INDEPENDENT_AMBULATORY_CARE_PROVIDER_SITE_OTHER): Payer: Medicare Other | Admitting: Physician Assistant

## 2016-02-03 ENCOUNTER — Encounter: Payer: Self-pay | Admitting: Physician Assistant

## 2016-02-03 VITALS — BP 100/70 | HR 99 | Temp 97.5°F | Resp 16 | Ht 63.0 in | Wt 118.0 lb

## 2016-02-03 DIAGNOSIS — R05 Cough: Secondary | ICD-10-CM | POA: Diagnosis not present

## 2016-02-03 DIAGNOSIS — R059 Cough, unspecified: Secondary | ICD-10-CM

## 2016-02-03 DIAGNOSIS — Z8572 Personal history of non-Hodgkin lymphomas: Secondary | ICD-10-CM | POA: Insufficient documentation

## 2016-02-03 MED ORDER — LEVOFLOXACIN 500 MG PO TABS: 500.0000 mg | ORAL_TABLET | Freq: Every day | ORAL | Status: DC

## 2016-02-03 MED ORDER — PREDNISONE 20 MG PO TABS: ORAL_TABLET | ORAL | Status: DC

## 2016-02-03 NOTE — Progress Notes (Signed)
Subjective:    Patient ID: Shannon Parsons, female    DOB: 1941-07-23, 75 y.o.   MRN: [id number]  HPI 75 y.o. WF with history of HTN, GERD, preDM, diverticulosis, anxiety, chol and vitamin D being treated for Mantle cell lymphoma at cancer center with Ennever, complicated by DVT of her right leg on 01/04/2015 off Xarelto presents for continuing cough. Patient has been treated for recurrent URI x Feb, at that time did not take ABX, but then on April 17th, took 2 doses of zpak, came back in 05/04 and treated with Augmentin. She continue to have productive yellow sputum, rhinorrhea, some chest tightness but no SOB, CP, leg swelling, palpitations. Denies fever, chills, sweats, weight loss.  Wt Readings from Last 3 Encounters:  02/03/16 118 lb (53.524 kg)  01/27/16 118 lb (53.524 kg)  01/15/16 118 lb (53.524 kg)    Blood pressure 100/70, pulse 99, temperature 97.5 F (36.4 C), temperature source Temporal, resp. rate 16, height 5\' 3"  (1.6 m), weight 118 lb (53.524 kg), last menstrual period 09/14/2014, SpO2 97 %.  Past Medical History  Diagnosis Date  . OSA (obstructive sleep apnea)   . Hiatal hernia   . IBS (irritable bowel syndrome)   . Fibrocystic breast disease   . Asthma   . Diverticulosis   . Adenomatous colon polyp 1994  . Internal hemorrhoids   . Melanoma ([place])     Facial  . Hyperlipidemia   . Hypertension   . GERD (gastroesophageal reflux disease)   . Osteoporosis   . Osteoarthritis   . Vitamin D deficiency   . Prediabetes   . Unspecified hypothyroidism   . Family history of ischemic heart disease   . Mantle cell lymphoma ([place]) 10/18/2014   Current Outpatient Prescriptions on File Prior to Visit  Medication Sig Dispense Refill  . acetaminophen (TYLENOL) 325 MG tablet Take 325 mg by mouth every 6 (six) hours as needed (For pain.).     [name] albuterol (PROVENTIL HFA;VENTOLIN HFA) 108 (90 BASE) MCG/ACT inhaler Inhale 2 puffs into the lungs every 6 (six) hours as needed for  wheezing or shortness of breath (cough). 1 Inhaler 0  . albuterol (PROVENTIL) (2.5 MG/3ML) 0.083% nebulizer solution Take 3 mLs (2.5 mg total) by nebulization every 4 (four) hours as needed for wheezing or shortness of breath. 30 vial 0  . ALPRAZolam (XANAX) 0.5 MG tablet Take 0.5 mg by mouth at bedtime as needed for anxiety.    . Cholecalciferol (VITAMIN D) 2000 UNITS CAPS Take 2,000-4,000 capsules by mouth every other day. Patient alternates 1 cap with 2 caps every other day.    . hyoscyamine (LEVSIN, ANASPAZ) 0.125 MG tablet Take 1 to 2 tablets every 4 hours as needed for nausea, cramping, bloating or diarrhea 90 tablet 11  . levothyroxine (SYNTHROID, LEVOTHROID) 50 MCG tablet TAKE 1 TABLET BY MOUTH ONCE DAILY. 90 tablet 3  . lidocaine-prilocaine (EMLA) cream Apply 1 application topically as needed. Apply to [facility] one hour before access. 30 g 3  . loratadine (CLARITIN) 10 MG tablet Take 1 tablet (10 mg total) by mouth daily. 20 tablet 1  . Multiple Vitamin (MULTIVITAMIN WITH MINERALS) TABS tablet Take 1 tablet by mouth daily.    . Probiotic Product (PROBIOTIC DAILY PO) Take 1 capsule by mouth daily.    . vitamin B-12 (CYANOCOBALAMIN) 1000 MCG tablet Take 1,000 mcg by mouth daily.    . promethazine-dextromethorphan (PROMETHAZINE-DM) 6.25-15 MG/5ML syrup Take 5-10 mL PO q8hrs prn for cold symptoms (Patient  not taking: Reported on 02/03/2016) 360 mL 1   No current facility-administered medications on file prior to visit.     Review of Systems  Constitutional: Negative for fever and chills.  HENT: Positive for congestion. Negative for ear pain and sore throat.   Respiratory: Positive for cough. Negative for shortness of breath and wheezing.   Cardiovascular: Negative for chest pain, palpitations and leg swelling.  Skin: Negative.   Neurological: Negative for headaches.       Objective:   Physical Exam  Constitutional: She is oriented to person, place, and time. She appears well-developed and  well-nourished.  HENT:  Head: Normocephalic and atraumatic.  Right Ear: External ear normal.  Left Ear: External ear normal.  Nose: Nose normal.  Mouth/Throat: Oropharynx is clear and moist.  Eyes: Conjunctivae are normal. Pupils are equal, round, and reactive to light.  Neck: Normal range of motion. Neck supple.  Cardiovascular: Normal rate and regular rhythm.   Pulmonary/Chest: Effort normal. No respiratory distress. She has decreased breath sounds (RLL with possible rhonchi). She has no wheezes. She has no rales. She exhibits no tenderness.  Abdominal: Soft. Bowel sounds are normal.  Musculoskeletal:  No edema, no warmth, redness bilateral legs, negative homen's sign.   Lymphadenopathy:    She has no cervical adenopathy.  Neurological: She is alert and oriented to person, place, and time.  Skin: Skin is warm and dry.       Assessment & Plan:  Cough No fever, chills, weight loss, night sweats, leg swelling, CP, palpitations + mucus, possible still infectious versus bronchitis- will get CXR to rule out pneumonia or lymphoma complication Pending CXR and labs, may get CT chest Levaquin, albuterol inhaler, sample of symbicort, prednisone, stay on claritin

## 2016-02-03 NOTE — Patient Instructions (Signed)

## 2016-02-11 ENCOUNTER — Other Ambulatory Visit: Payer: Self-pay | Admitting: Physician Assistant

## 2016-02-19 ENCOUNTER — Ambulatory Visit (INDEPENDENT_AMBULATORY_CARE_PROVIDER_SITE_OTHER): Payer: Medicare Other | Admitting: Physician Assistant

## 2016-02-19 ENCOUNTER — Encounter: Payer: Self-pay | Admitting: Physician Assistant

## 2016-02-19 VITALS — BP 100/66 | HR 86 | Temp 97.7°F | Resp 14 | Ht 63.0 in | Wt 120.0 lb

## 2016-02-19 DIAGNOSIS — J988 Other specified respiratory disorders: Secondary | ICD-10-CM

## 2016-02-19 DIAGNOSIS — C8310 Mantle cell lymphoma, unspecified site: Secondary | ICD-10-CM

## 2016-02-19 LAB — BASIC METABOLIC PANEL WITH GFR
BUN: 11 mg/dL (ref 7–25)
CHLORIDE: 101 mmol/L (ref 98–110)
CO2: 27 mmol/L (ref 20–31)
Calcium: 9 mg/dL (ref 8.6–10.4)
Creat: 0.76 mg/dL (ref 0.60–0.93)
GFR, EST NON AFRICAN AMERICAN: 79 mL/min (ref >=60)
GFR, Est African American: >89 mL/min (ref >=60)
GLUCOSE: 83 mg/dL (ref 65–99)
Potassium: 4.1 mmol/L (ref 3.5–5.3)
SODIUM: 137 mmol/L (ref 135–146)

## 2016-02-19 LAB — HEPATIC FUNCTION PANEL
ALT: 12 U/L (ref 6–29)
AST: 22 U/L (ref 10–35)
Albumin: 3.8 g/dL (ref 3.6–5.1)
Alkaline Phosphatase: 64 U/L (ref 33–130)
BILIRUBIN DIRECT: 0.1 mg/dL (ref <=0.2)
BILIRUBIN INDIRECT: 0.3 mg/dL (ref 0.2–1.2)
Total Bilirubin: 0.4 mg/dL (ref 0.2–1.2)
Total Protein: 5.9 g/dL — ABNORMAL LOW (ref 6.1–8.1)

## 2016-02-19 LAB — LACTATE DEHYDROGENASE: LDH: 189 U/L (ref 94–250)

## 2016-02-19 MED ORDER — DOXYCYCLINE HYCLATE 100 MG PO TABS: 100.0000 mg | ORAL_TABLET | Freq: Two times a day (BID) | ORAL | Status: DC

## 2016-02-19 NOTE — Patient Instructions (Addendum)
Stop the symbicort Can do breathing treatment or ventolin twice a day  If you get leg swelling, worsening shortness of breath or chest pain go to the ER  We will get a CT chest to look for infection  We are sending you to a pulmonary doctor  Please get on the doxycycline this helps to treat ATYPICAL infections

## 2016-02-19 NOTE — Progress Notes (Signed)
Subjective:    Patient ID: Shannon Parsons, female    DOB: 07-02-1941, 75 y.o.   MRN: [id number]  HPI 75 y.o. WF with history of HTN, GERD, preDM, diverticulosis, anxiety, chol and vitamin D being treated for Mantle cell lymphoma at cancer center with Ennever, complicated by DVT of her right leg on 01/04/2015 off Xarelto presents for continuing cough. She was treated with recurrrent URI in feb, then April 17th, then 05/04 and again 05/23, she had a normal CXR 02/03/2016.  She presents here again today with continuing sinus/cough, states she was never better from previous visit treated with levaquin. She continue to have rhinorrhea, yellow productive sputum,  Some chest discomfort/soreness when she coughs only.  Denies fever, chills, sweats, SOB, leg swelling, palpitations.   Weight: 120 lb (54.432 kg)   Current Outpatient Prescriptions on File Prior to Visit  Medication Sig Dispense Refill  . acetaminophen (TYLENOL) 325 MG tablet Take 325 mg by mouth every 6 (six) hours as needed (For pain.).     [name] albuterol (PROVENTIL HFA;VENTOLIN HFA) 108 (90 BASE) MCG/ACT inhaler Inhale 2 puffs into the lungs every 6 (six) hours as needed for wheezing or shortness of breath (cough). 1 Inhaler 0  . albuterol (PROVENTIL) (2.5 MG/3ML) 0.083% nebulizer solution Take 3 mLs (2.5 mg total) by nebulization every 4 (four) hours as needed for wheezing or shortness of breath. 30 vial 0  . ALPRAZolam (XANAX) 0.5 MG tablet Take 0.5 mg by mouth at bedtime as needed for anxiety.    . Cholecalciferol (VITAMIN D) 2000 UNITS CAPS Take 2,000-4,000 capsules by mouth every other day. Patient alternates 1 cap with 2 caps every other day.    . hyoscyamine (LEVSIN, ANASPAZ) 0.125 MG tablet Take 1 to 2 tablets every 4 hours as needed for nausea, cramping, bloating or diarrhea 90 tablet 11  . levothyroxine (SYNTHROID, LEVOTHROID) 50 MCG tablet TAKE 1 TABLET BY MOUTH ONCE DAILY. 90 tablet 3  . lidocaine-prilocaine (EMLA) cream Apply 1  application topically as needed. Apply to [facility] one hour before access. 30 g 3  . loratadine (CLARITIN) 10 MG tablet Take 1 tablet (10 mg total) by mouth daily. 20 tablet 1  . Multiple Vitamin (MULTIVITAMIN WITH MINERALS) TABS tablet Take 1 tablet by mouth daily.    . Probiotic Product (PROBIOTIC DAILY PO) Take 1 capsule by mouth daily.    . promethazine-dextromethorphan (PROMETHAZINE-DM) 6.25-15 MG/5ML syrup Take 5-10 mL PO q8hrs prn for cold symptoms 360 mL 1  . vitamin B-12 (CYANOCOBALAMIN) 1000 MCG tablet Take 1,000 mcg by mouth daily.     No current facility-administered medications on file prior to visit.   Past Medical History  Diagnosis Date  . OSA (obstructive sleep apnea)   . Hiatal hernia   . IBS (irritable bowel syndrome)   . Fibrocystic breast disease   . Asthma   . Diverticulosis   . Adenomatous colon polyp 1994  . Internal hemorrhoids   . Melanoma ([place])     Facial  . Hyperlipidemia   . Hypertension   . GERD (gastroesophageal reflux disease)   . Osteoporosis   . Osteoarthritis   . Vitamin D deficiency   . Prediabetes   . Unspecified hypothyroidism   . Family history of ischemic heart disease   . Mantle cell lymphoma ([place]) 10/18/2014     Review of Systems  Constitutional: Negative for fever and chills.  HENT: Positive for congestion. Negative for ear pain and sore throat.   Respiratory: Positive for  cough. Negative for shortness of breath and wheezing.   Cardiovascular: Negative for chest pain, palpitations and leg swelling.  Skin: Negative.   Neurological: Negative for headaches.       Objective:   Physical Exam  Constitutional: She is oriented to person, place, and time. She appears well-developed and well-nourished.  HENT:  Head: Normocephalic and atraumatic.  Right Ear: External ear normal.  Left Ear: External ear normal.  Nose: Nose normal.  Mouth/Throat: Oropharynx is clear and moist.  Eyes: Conjunctivae are normal. Pupils are equal, round, and  reactive to light.  Neck: Normal range of motion. Neck supple.  Cardiovascular: Normal rate and regular rhythm.   Pulmonary/Chest: Effort normal. No respiratory distress. She has decreased breath sounds (possible rhonchi bilateral lungs). She has no wheezes. She has no rales. She exhibits no tenderness.  Abdominal: Soft. Bowel sounds are normal.  Musculoskeletal:  No edema, no warmth, redness bilateral legs, negative homen's sign.   Lymphadenopathy:    She has no cervical adenopathy.  Neurological: She is alert and oriented to person, place, and time.  Skin: Skin is warm and dry.       Assessment & Plan:  1.  Recurrent respiratory infection - Lactate dehydrogenase - Complement, total - IgG, IgA, IgM - CT Chest Wo Contrast; Future - Ambulatory referral to Pulmonology - will treat with doxy, will get CT chest to rule out infection/mass, will refer to pulmonary, will look for IGM def since recurrent infection/autoimmune.

## 2016-02-20 LAB — CBC WITH DIFFERENTIAL/PLATELET
BASOS ABS: 60 {cells}/uL (ref 0–200)
Basophils Relative: 1 %
EOS PCT: 1 %
Eosinophils Absolute: 60 cells/uL (ref 15–500)
HEMATOCRIT: 41.4 % (ref 35.0–45.0)
HEMOGLOBIN: 13.4 g/dL (ref 11.7–15.5)
LYMPHS ABS: 600 {cells}/uL — AB (ref 850–3900)
Lymphocytes Relative: 10 %
MCH: 30 pg (ref 27.0–33.0)
MCHC: 32.4 g/dL (ref 32.0–36.0)
MCV: 92.6 fL (ref 80.0–100.0)
MONO ABS: 660 {cells}/uL (ref 200–950)
MPV: 8.8 fL (ref 7.5–12.5)
Monocytes Relative: 11 %
NEUTROS ABS: 4620 {cells}/uL (ref 1500–7800)
Neutrophils Relative %: 77 %
Platelets: 225 10*3/uL (ref 140–400)
RBC: 4.47 MIL/uL (ref 3.80–5.10)
RDW: 14.7 % (ref 11.0–15.0)
WBC: 6 10*3/uL (ref 3.8–10.8)

## 2016-02-20 LAB — IGG, IGA, IGM
IGM, SERUM: 6 mg/dL — AB (ref 48–271)
IgA: 49 mg/dL — ABNORMAL LOW (ref 81–463)
IgG (Immunoglobin G), Serum: 582 mg/dL — ABNORMAL LOW (ref 694–1618)

## 2016-02-21 LAB — COMPLEMENT, TOTAL: Compl, Total (CH50): >60 U/mL — ABNORMAL HIGH (ref 31–60)

## 2016-02-25 ENCOUNTER — Ambulatory Visit
Admission: RE | Admit: 2016-02-25 | Discharge: 2016-02-25 | Disposition: A | Discharge status: Home / Self Care | Payer: Medicare Other | Source: Ambulatory Visit | Attending: Physician Assistant | Admitting: Physician Assistant

## 2016-02-25 DIAGNOSIS — J988 Other specified respiratory disorders: Secondary | ICD-10-CM

## 2016-02-26 ENCOUNTER — Telehealth: Payer: Self-pay

## 2016-02-26 NOTE — Telephone Encounter (Signed)
Pt aware of CT scan & states she has an appt with Pulmonology Aug 8th.

## 2016-03-18 ENCOUNTER — Encounter: Payer: Self-pay | Admitting: Internal Medicine

## 2016-03-18 ENCOUNTER — Ambulatory Visit (INDEPENDENT_AMBULATORY_CARE_PROVIDER_SITE_OTHER): Payer: Medicare Other | Admitting: Internal Medicine

## 2016-03-18 VITALS — BP 110/60 | HR 75 | Ht 64.0 in | Wt 121.0 lb

## 2016-03-18 DIAGNOSIS — Z23 Encounter for immunization: Secondary | ICD-10-CM

## 2016-03-18 DIAGNOSIS — J471 Bronchiectasis with (acute) exacerbation: Secondary | ICD-10-CM | POA: Diagnosis not present

## 2016-03-18 DIAGNOSIS — J479 Bronchiectasis, uncomplicated: Secondary | ICD-10-CM | POA: Insufficient documentation

## 2016-03-18 MED ORDER — FLUTTER DEVI: Status: DC

## 2016-03-18 MED ORDER — BUDESONIDE-FORMOTEROL FUMARATE 80-4.5 MCG/ACT IN AERO: INHALATION_SPRAY | RESPIRATORY_TRACT | Status: DC

## 2016-03-18 NOTE — Patient Instructions (Addendum)
Bronchiectasis =   you have scarring of your bronchial tubes which means that they don't function perfectly normally and mucus tends to pool in certain areas of your lung which can cause pneumonia and further scarring of your lung and bronchial tubes      Plan A = Automatic = Symbiocort 80 Take 2 puffs first thing in am and then another 2 puffs about 12 hours later.    Work on inhaler technique:  relax and gently blow all the way out then take a nice smooth deep breath back in, triggering the inhaler at same time you start breathing in.  Hold for up to 5 seconds if you can. Blow out thru nose. Rinse and gargle with water when done    Plan B = Backup Only use your albuterol as a rescue medication   - The less you use it, the better it will work when you need it. - Ok to use the inhaler up to 2 puffs  every 4 hours if you must but call for appointment if use goes up over your usual need - Don't leave home without it !!  (think of it like the spare tire for your car)   Plan C = Crisis - only use your albuterol nebulizer if you first try Plan B and it fails to help > ok to use the nebulizer up to every 4 hours but if start needing it regularly call for immediate appointment   For cough try mucinex 1200 mg every 12 hours as needed / flutter valve as much as can   GERD (REFLUX)  is an extremely common cause of respiratory symptoms just like yours , many times with no obvious heartburn at all.    It can be treated with medication, but also with lifestyle changes including elevation of the head of your bed (ideally with 6 inch  bed blocks),  Smoking cessation, avoidance of late meals, excessive alcohol, and avoid fatty foods, chocolate, peppermint, colas, red wine, and acidic juices such as orange juice.  NO MINT OR MENTHOL PRODUCTS SO NO COUGH DROPS  USE SUGARLESS CANDY INSTEAD (Jolley ranchers or Stover's or Life Savers) or even ice chips will also do - the key is to swallow to prevent all throat  clearing. NO OIL BASED VITAMINS - use powdered substitutes.  We need to give you the Prevnar 13 if ok with Dr Marin Olp   Please schedule a follow up office visit in 4 weeks, sooner if needed - I will let Dr Marin Olp know we need to postpone your next Retuximab until we get your cough better

## 2016-03-18 NOTE — Progress Notes (Signed)
Subjective:     Patient ID: Shannon Parsons, female   DOB: 04/09/1941     MRN: [id number]  HPI   27 yowf never smoker with sev bad childhood pneumonia's and asthma but seemed outgrow it until in 1970s with assoc nasal/chest congestions/ cough >    took allergy shots x 5 years and much better except for spells of head cold going into chest not related to seasons  with sev visits per year s chronic rx and prev eval by Clance who recorded it was worse in spring and fall and rec maint rx with qvar and rec try singulair but she never did either and referred to pulmonary clinic 03/18/2016 by Dr Vicie Mutters back to pulmonary clinic     03/18/2016 1st [facility] office visit/ Wert  On Retuximab  And prn  saba only  Chief Complaint  Patient presents with  . Pulmonary Consult    Referred by Vicie Mutters. Pt c/o cough since April 2017- prod with yellow sputm. Cough is worse about 3 hours after she wakes up in the am.   symptoms of cough/ congestion/ wheezing/ rattling x years but much worse since mid April 2017 rx with muliptle courses of abx and prednisone   Cough is worse in am's x 3 h - no flutter valve / only using saba maybe once a day, very poor insight into hx/ meds / prev instructions  Really not limted by breathing unless while coughing  No obvious day to day or daytime variability or assoc  or cp or chest tightness, subjective wheeze or overt sinus or hb symptoms. No unusual exp hx or h/o childhood pna/ asthma or knowledge of premature birth.    Also denies any obvious fluctuation of symptoms with weather or environmental changes or other aggravating or alleviating factors except as outlined above   Current Medications, Allergies, Complete Past Medical History, Past Surgical History, Family History, and Social History were reviewed in [employer] record.  ROS  The following are not active complaints unless bolded sore throat, dysphagia, dental problems,  itching, sneezing,  nasal congestion or excess/ purulent secretions, ear ache,   fever, chills, sweats, unintended wt loss, classically pleuritic or exertional cp, hemoptysis,  orthopnea pnd or leg swelling, presyncope, palpitations, abdominal pain, anorexia, nausea, vomiting, diarrhea  or change in bowel or bladder habits, change in stools or urine, dysuria,hematuria,  rash, arthralgias, visual complaints, headache, numbness, weakness or ataxia or problems with walking or coordination,  change in mood/affect or memory.         Review of Systems     Objective:   Physical Exam    amb wf very congested cough   Wt Readings from Last 3 Encounters:  03/18/16 121 lb (54.885 kg)  02/19/16 120 lb (54.432 kg)  02/03/16 118 lb (53.524 kg)    Vital signs reviewed   HEENT: nl dentition, turbinates, and oropharynx. Nl external ear canals without cough reflex   NECK :  without JVD/Nodes/TM/ nl carotid upstrokes bilaterally   LUNGS: no acc muscle use,  Nl contour chest with mid exp bilateral junky  rhonchi/ cough   CV:  RRR  no s3 or murmur or increase in P2, no edema   ABD:  soft and nontender with nl inspiratory excursion in the supine position. No bruits or organomegaly, bowel sounds nl  MS:  Nl gait/ ext warm without deformities, calf tenderness, cyanosis or clubbing No obvious joint restrictions   SKIN: warm and dry  without lesions    NEURO:  alert, approp, nl sensorium with  no motor deficits     I personally reviewed images and agree with radiology impression as follows:  CT Chest    W/o contrast  02/25/16  Diffuse mild cylindrical bronchiectasis throughout both lungs. Patchy tree-in-bud opacities and areas of mucoid impaction in both lungs, most prominent in the basilar right upper lobe       Assessment:

## 2016-03-24 ENCOUNTER — Telehealth: Payer: Self-pay | Admitting: Internal Medicine

## 2016-03-24 NOTE — Telephone Encounter (Signed)
Called spoke with pt. Aware of recs below. Nothing further needed 

## 2016-03-24 NOTE — Telephone Encounter (Signed)
Per 03/18/16 OV: Patient Instructions       Bronchiectasis =   you have scarring of your bronchial tubes which means that they don't function perfectly normally and mucus tends to pool in certain areas of your lung which can cause pneumonia and further scarring of your lung and bronchial tubes      Plan A = Automatic = Symbiocort 80 Take 2 puffs first thing in am and then another 2 puffs about 12 hours later.    Work on inhaler technique:  relax and gently blow all the way out then take a nice smooth deep breath back in, triggering the inhaler at same time you start breathing in.  Hold for up to 5 seconds if you can. Blow out thru nose. Rinse and gargle with water when done     Plan B = Backup Only use your albuterol as a rescue medication    - The less you use it, the better it will work when you need it. - Ok to use the inhaler up to 2 puffs  every 4 hours if you must but call for appointment if use goes up over your usual need - Don't leave home without it !!  (think of it like the spare tire for your car)   Plan C = Crisis - only use your albuterol nebulizer if you first try Plan B and it fails to help > ok to use the nebulizer up to every 4 hours but if start needing it regularly call for immediate appointment   For cough try mucinex 1200 mg every 12 hours as needed / flutter valve as much as can   GERD (REFLUX)  is an extremely common cause of respiratory symptoms just like yours , many times with no obvious heartburn at all.    It can be treated with medication, but also with lifestyle changes including elevation of the head of your bed (ideally with 6 inch  bed blocks),  Smoking cessation, avoidance of late meals, excessive alcohol, and avoid fatty foods, chocolate, peppermint, colas, red wine, and acidic juices such as orange juice.   NO MINT OR MENTHOL PRODUCTS SO NO COUGH DROPS  USE SUGARLESS CANDY INSTEAD (Jolley ranchers or Stover's or Life Savers) or even ice chips will also do  - the key is to swallow to prevent all throat clearing. NO OIL BASED VITAMINS - use powdered substitutes.  We need to give you the Prevnar 13 if ok with Dr Marin Olp    Please schedule a follow up office visit in 4 weeks, sooner if needed - I will let Dr Marin Olp know we need to postpone your next Retuximab until we get your cough better   --  Called spoke with pt. She reports the area she was given the prevnar vaccine on 7/6 is very itchy and is broken out w/ a rash. She denies any fever in the area. She has not tried anything OTC. Please advise Dr. Melvyn Novas thanks

## 2016-03-24 NOTE — Telephone Encounter (Signed)
If the rash is localized around the shot can use cortaid ieg 1% hydrocortisone For itching> benadryl 25 mg every 4 h as needed  If conditioning worsening will need to be seen here or go to ER

## 2016-03-25 ENCOUNTER — Telehealth: Payer: Self-pay | Admitting: Internal Medicine

## 2016-03-25 MED ORDER — AMOXICILLIN-POT CLAVULANATE 875-125 MG PO TABS: 1.0000 | ORAL_TABLET | Freq: Two times a day (BID) | ORAL | Status: DC

## 2016-03-25 NOTE — Telephone Encounter (Signed)
Spoke with pt, aware of recs.  rx called in to preferred pharmacy.  Nothing further needed.  

## 2016-03-25 NOTE — Telephone Encounter (Signed)
Spoke with pt, c/o worsening sinus congestion, chest congestion, prod cough with yellow mucus, runny/stuffy nose.  S/s present X2 days.  Denies fever, chest pain.  Pt wants to know if this could come from her pna vaccine.  Pt called yesterday about a localized rash at injection site.   Pt uses [facility].    MW please advise on recs.  Thanks.    Patient Instructions       Bronchiectasis =   you have scarring of your bronchial tubes which means that they don't function perfectly normally and mucus tends to pool in certain areas of your lung which can cause pneumonia and further scarring of your lung and bronchial tubes      Plan A = Automatic = Symbiocort 80 Take 2 puffs first thing in am and then another 2 puffs about 12 hours later.    Work on inhaler technique:  relax and gently blow all the way out then take a nice smooth deep breath back in, triggering the inhaler at same time you start breathing in.  Hold for up to 5 seconds if you can. Blow out thru nose. Rinse and gargle with water when done     Plan B = Backup Only use your albuterol as a rescue medication    - The less you use it, the better it will work when you need it. - Ok to use the inhaler up to 2 puffs  every 4 hours if you must but call for appointment if use goes up over your usual need - Don't leave home without it !!  (think of it like the spare tire for your car)   Plan C = Crisis - only use your albuterol nebulizer if you first try Plan B and it fails to help > ok to use the nebulizer up to every 4 hours but if start needing it regularly call for immediate appointment   For cough try mucinex 1200 mg every 12 hours as needed / flutter valve as much as can   GERD (REFLUX)  is an extremely common cause of respiratory symptoms just like yours , many times with no obvious heartburn at all.    It can be treated with medication, but also with lifestyle changes including elevation of the head of your bed (ideally with 6  inch  bed blocks),  Smoking cessation, avoidance of late meals, excessive alcohol, and avoid fatty foods, chocolate, peppermint, colas, red wine, and acidic juices such as orange juice.   NO MINT OR MENTHOL PRODUCTS SO NO COUGH DROPS  USE SUGARLESS CANDY INSTEAD (Jolley ranchers or Stover's or Life Savers) or even ice chips will also do - the key is to swallow to prevent all throat clearing. NO OIL BASED VITAMINS - use powdered substitutes.  We need to give you the Prevnar 13 if ok with Dr Marin Olp    Please schedule a follow up office visit in 4 weeks, sooner if needed - I will let Dr Marin Olp know we need to postpone your next Retuximab until we get your cough better

## 2016-03-25 NOTE — Telephone Encounter (Signed)
Augmentin 875 mg take one pill twice daily  X 10 days - take at breakfast and supper with large glass of water.  It would help reduce the usual side effects (diarrhea and yeast infections) if you ate cultured yogurt at lunch.   For cough try mucinex 1200 mg every 12 hours as needed / flutter valve as much as can

## 2016-03-29 ENCOUNTER — Ambulatory Visit: Payer: Medicare Other

## 2016-03-29 ENCOUNTER — Encounter: Payer: Self-pay | Admitting: Hematology & Oncology

## 2016-03-29 ENCOUNTER — Ambulatory Visit (HOSPITAL_BASED_OUTPATIENT_CLINIC_OR_DEPARTMENT_OTHER): Payer: Medicare Other | Admitting: Hematology & Oncology

## 2016-03-29 ENCOUNTER — Other Ambulatory Visit (HOSPITAL_BASED_OUTPATIENT_CLINIC_OR_DEPARTMENT_OTHER): Payer: Medicare Other

## 2016-03-29 VITALS — BP 125/67 | HR 83 | Temp 97.7°F | Resp 18 | Ht 64.0 in | Wt 120.0 lb

## 2016-03-29 DIAGNOSIS — C8310 Mantle cell lymphoma, unspecified site: Secondary | ICD-10-CM

## 2016-03-29 DIAGNOSIS — J479 Bronchiectasis, uncomplicated: Secondary | ICD-10-CM | POA: Diagnosis not present

## 2016-03-29 DIAGNOSIS — C8313 Mantle cell lymphoma, intra-abdominal lymph nodes: Secondary | ICD-10-CM

## 2016-03-29 DIAGNOSIS — C8312 Mantle cell lymphoma, intrathoracic lymph nodes: Secondary | ICD-10-CM

## 2016-03-29 DIAGNOSIS — Z86718 Personal history of other venous thrombosis and embolism: Secondary | ICD-10-CM | POA: Diagnosis not present

## 2016-03-29 DIAGNOSIS — L502 Urticaria due to cold and heat: Secondary | ICD-10-CM

## 2016-03-29 LAB — CMP (CANCER CENTER ONLY)
ALBUMIN: 3.5 g/dL (ref 3.3–5.5)
ALK PHOS: 62 U/L (ref 26–84)
ALT: 26 U/L (ref 10–47)
AST: 31 U/L (ref 11–38)
BILIRUBIN TOTAL: 0.6 mg/dL (ref 0.20–1.60)
BUN, Bld: 11 mg/dL (ref 7–22)
CALCIUM: 9.6 mg/dL (ref 8.0–10.3)
CO2: 30 mEq/L (ref 18–33)
Chloride: 97 mEq/L — ABNORMAL LOW (ref 98–108)
Creat: 0.8 mg/dl (ref 0.6–1.2)
Glucose, Bld: 80 mg/dL (ref 73–118)
Potassium: 4.2 mEq/L (ref 3.3–4.7)
Sodium: 135 mEq/L (ref 128–145)
TOTAL PROTEIN: 6.2 g/dL — AB (ref 6.4–8.1)

## 2016-03-29 LAB — CBC WITH DIFFERENTIAL (CANCER CENTER ONLY)
BASO#: 0.1 10*3/uL (ref 0.0–0.2)
BASO%: 1.4 % (ref 0.0–2.0)
EOS ABS: 0.2 10*3/uL (ref 0.0–0.5)
EOS%: 2.1 % (ref 0.0–7.0)
HEMATOCRIT: 40.2 % (ref 34.8–46.6)
HEMOGLOBIN: 13.7 g/dL (ref 11.6–15.9)
LYMPH#: 0.6 10*3/uL — ABNORMAL LOW (ref 0.9–3.3)
LYMPH%: 7.7 % — ABNORMAL LOW (ref 14.0–48.0)
MCH: 31.2 pg (ref 26.0–34.0)
MCHC: 34.1 g/dL (ref 32.0–36.0)
MCV: 92 fL (ref 81–101)
MONO#: 0.8 10*3/uL (ref 0.1–0.9)
MONO%: 10.5 % (ref 0.0–13.0)
NEUT%: 78.3 % (ref 39.6–80.0)
NEUTROS ABS: 5.7 10*3/uL (ref 1.5–6.5)
Platelets: 226 10*3/uL (ref 145–400)
RBC: 4.39 10*6/uL (ref 3.70–5.32)
RDW: 13.8 % (ref 11.1–15.7)
WBC: 7.3 10*3/uL (ref 3.9–10.0)

## 2016-03-29 LAB — TECHNOLOGIST REVIEW CHCC SATELLITE

## 2016-03-29 LAB — LACTATE DEHYDROGENASE: LDH: 265 U/L — ABNORMAL HIGH (ref 125–245)

## 2016-03-29 MED ORDER — HEPARIN SOD (PORK) LOCK FLUSH 100 UNIT/ML IV SOLN
500.0000 [IU] | Freq: Once | INTRAVENOUS | Status: AC
  Administered 2016-03-29: 500 [IU] via INTRAVENOUS
  Filled 2016-03-29: qty 5

## 2016-03-29 MED ORDER — SODIUM CHLORIDE 0.9% FLUSH
10.0000 mL | INTRAVENOUS | Status: DC | PRN
  Administered 2016-03-29: 10 mL via INTRAVENOUS
  Filled 2016-03-29: qty 10

## 2016-03-29 NOTE — Patient Instructions (Signed)

## 2016-03-29 NOTE — Progress Notes (Signed)
Hematology and Oncology Follow Up Visit  Shannon Parsons [id number] [birth year] 75 y.o. 03/29/2016   Principle Diagnosis:   Mantle cell lymphoma (MCIPI = 5)  Thromboembolic event of the right leg  Current Therapy:    Rituxan - maintanence - s/p cycle #4 - on hold secondary to bronchiectasis     Interim History:  [name] is back for follow-up.her problems now that she has bronchiectasis. She has been seen pulmonary medicine. They're not yet done a bronchoscopy. They want to have the Rituxan held for a little bit. I think this would be reasonable.  She is coughing up quite a lot of mucus. I'm not sure this has any bacteria in it. She is on some antibiotics.  Her quality of life clearly is affected by this. She really cannot do what she would like to cannot walk as much as she would like.  She's had no rashes. She's had no fever. She's had no bleeding. There's been no change in bowel or bladder habits.  She's had no leg swelling. She's had no joint problems.  Overall, her performance status is ECOG 1.  Medications:  Current outpatient prescriptions:  .  acetaminophen (TYLENOL) 325 MG tablet, Take 325 mg by mouth every 6 (six) hours as needed (For pain.). , Disp: , Rfl:  .  albuterol (PROVENTIL HFA;VENTOLIN HFA) 108 (90 BASE) MCG/ACT inhaler, Inhale 2 puffs into the lungs every 6 (six) hours as needed for wheezing or shortness of breath (cough)., Disp: 1 Inhaler, Rfl: 0 .  albuterol (PROVENTIL) (2.5 MG/3ML) 0.083% nebulizer solution, Take 3 mLs (2.5 mg total) by nebulization every 4 (four) hours as needed for wheezing or shortness of breath., Disp: 30 vial, Rfl: 0 .  ALPRAZolam (XANAX) 0.5 MG tablet, Take 0.5 mg by mouth at bedtime as needed for anxiety., Disp: , Rfl:  .  amoxicillin-clavulanate (AUGMENTIN) 875-125 MG tablet, Take 1 tablet by mouth 2 (two) times daily., Disp: 20 tablet, Rfl: 0 .  budesonide-formoterol (SYMBICORT) 80-4.5 MCG/ACT inhaler, Take 2 puffs first thing  in am and then another 2 puffs about 12 hours later., Disp: , Rfl:  .  Cholecalciferol (VITAMIN D) 2000 UNITS CAPS, Take 2,000-4,000 capsules by mouth every other day. Patient alternates 1 cap with 2 caps every other day., Disp: , Rfl:  .  hyoscyamine (LEVSIN, ANASPAZ) 0.125 MG tablet, Take 1 to 2 tablets every 4 hours as needed for nausea, cramping, bloating or diarrhea, Disp: 90 tablet, Rfl: 11 .  levothyroxine (SYNTHROID, LEVOTHROID) 50 MCG tablet, TAKE 1 TABLET BY MOUTH ONCE DAILY., Disp: 90 tablet, Rfl: 3 .  lidocaine-prilocaine (EMLA) cream, Apply 1 application topically as needed. Apply to [facility] one hour before access., Disp: 30 g, Rfl: 3 .  loratadine (CLARITIN) 10 MG tablet, Take 1 tablet (10 mg total) by mouth daily., Disp: 20 tablet, Rfl: 1 .  Multiple Vitamin (MULTIVITAMIN WITH MINERALS) TABS tablet, Take 1 tablet by mouth daily., Disp: , Rfl:  .  Probiotic Product (PROBIOTIC DAILY PO), Take 1 capsule by mouth daily., Disp: , Rfl:  .  promethazine-dextromethorphan (PROMETHAZINE-DM) 6.25-15 MG/5ML syrup, Take 5-10 mL PO q8hrs prn for cold symptoms, Disp: 360 mL, Rfl: 1 .  Respiratory Therapy Supplies (FLUTTER) DEVI, Take as directed, Disp: 1 each, Rfl: 0 .  vitamin B-12 (CYANOCOBALAMIN) 1000 MCG tablet, Take 1,000 mcg by mouth daily., Disp: , Rfl:   Allergies:  Allergies  Allergen Reactions  . Actonel [Risedronate Sodium] Other (See Comments)    "made me choke"   .  Fosamax [Alendronate] Other (See Comments)    "made me choke"  . Sulfa Antibiotics Other (See Comments)    Doesn't remember   . Tetracyclines & Related Rash    Past Medical History, Surgical history, Social history, and Family History were reviewed and updated.  Review of Systems: As above  Physical Exam:  height is 5\' 4"  (1.626 m) and weight is 120 lb (54.432 kg). Her oral temperature is 97.7 F (36.5 C). Her blood pressure is 125/67 and her pulse is 83. Her respiration is 18.   Thin but fairly well-nourished  white female. Head and neck exam shows no ocular or oral lesions. She has no bilateral cervical or supraclavicular lymph nodes. She has no palpable thyroid. Lungs are clear. Cardiac exam regular rate and rhythm with no murmurs, rubs or bruits. Axillary exam shows no obvious bilateral axillary adenopathy. Abdomen is soft. She has decent bowel sounds. There is no fluid wave. Her spleen tip is just below the left costal margin. There is no hepatomegaly. Back exam shows no tenderness over the spine, ribs or hips. Extremities shows no clubbing, cyanosis or edema. No palpable venous cord is noted in the right leg. She has good range of motion of her joints. She has good strength in her muscles. Neurological exam is nonfocal. Skin exam shows no rashes, ecchymoses or petechia  Lab Results  Component Value Date   WBC 7.3 03/29/2016   HGB 13.7 03/29/2016   HCT 40.2 03/29/2016   MCV 92 03/29/2016   PLT 226 03/29/2016     Chemistry      Component Value Date/Time   NA 135 03/29/2016 0906   NA 137 02/19/2016 1218   NA 138 08/01/2015 1124   K 4.2 03/29/2016 0906   K 4.1 02/19/2016 1218   K 4.0 08/01/2015 1124   CL 97* 03/29/2016 0906   CL 101 02/19/2016 1218   CO2 30 03/29/2016 0906   CO2 27 02/19/2016 1218   CO2 28 08/01/2015 1124   BUN 11 03/29/2016 0906   BUN 11 02/19/2016 1218   BUN 10.4 08/01/2015 1124   CREATININE 0.8 03/29/2016 0906   CREATININE 0.8 08/01/2015 1124   CREATININE 0.64 02/22/2015 0405      Component Value Date/Time   CALCIUM 9.6 03/29/2016 0906   CALCIUM 9.0 02/19/2016 1218   CALCIUM 9.6 08/01/2015 1124   ALKPHOS 62 03/29/2016 0906   ALKPHOS 64 02/19/2016 1218   ALKPHOS 88 08/01/2015 1124   AST 31 03/29/2016 0906   AST 22 02/19/2016 1218   AST 23 08/01/2015 1124   ALT 26 03/29/2016 0906   ALT 12 02/19/2016 1218   ALT 12 08/01/2015 1124   BILITOT 0.60 03/29/2016 0906   BILITOT 0.4 02/19/2016 1218   BILITOT <0.30 08/01/2015 1124         Impression and  Plan: Shannon Parsons is 75 year old white female. She has mantle cell lymphoma. Again, by the [facility] she is an intermediate risk.  She has responded very well. Again, we have to watch out for recurrence. She is at risk for recurrence.  She now has this bronchiectasis. It is hard to know if Rituxan is related to this or not. I'm sure there probably are cases in which Rituxan has contributed to bronchiectasis.   For now, we will hold on the Rituxan until pulmonary can get her bronchiectasis under better control. It sounds like she will need a bronchoscopy.   I will like to see her back in 6 weeks. She  sees the pulmonologist back in 4 weeks.  I am checking immunoglobulin levels on her. It is possible she may have low immunoglobulin levels. So, then we might try IVIG to see if this helps with the bronchiectasis.  I spent about 35 minutes with she and her husband this morning.  Volanda Napoleon, MD 7/17/201710:42 AM

## 2016-03-30 LAB — IGG, IGA, IGM
IGG (IMMUNOGLOBIN G), SERUM: 523 mg/dL — AB (ref 700–1600)
IgA, Qn, Serum: 41 mg/dL — ABNORMAL LOW (ref 64–422)
IgM, Qn, Serum: 6 mg/dL — ABNORMAL LOW (ref 26–217)

## 2016-04-01 ENCOUNTER — Telehealth: Payer: Self-pay | Admitting: Internal Medicine

## 2016-04-01 DIAGNOSIS — J479 Bronchiectasis, uncomplicated: Secondary | ICD-10-CM

## 2016-04-01 NOTE — Telephone Encounter (Signed)
Spoke with the pt and notified of recs per MW  She verbalized understanding and nothing further needed 

## 2016-04-01 NOTE — Telephone Encounter (Signed)
augmentin is very good but does have a few causes for cough it misses and these should be covered by cipro 500 mg bid x 10 days then try to leave off completely until visit and we'll recultures mucus or consider bronchoscopy at that point depending on her response

## 2016-04-01 NOTE — Telephone Encounter (Signed)
Stop the augmentin now and start the cipro now

## 2016-04-01 NOTE — Telephone Encounter (Signed)
Patient states she is still coughing up yellow/ green phlegm.  Chest feels tight all the way around to her back. No fever.  Nasal congestion, chest congestion, lot of drainage. She has been using the flutter valve and inhaler. Patient states she has been on antibiotics for 2 weeks. Patient already has an appointment scheduled to see Dr. Melvyn Novas on 04/21/16.  Pharmacy: [facility]  Allergies  Allergen Reactions  . Actonel [Risedronate Sodium] Other (See Comments)    "made me choke"   . Fosamax [Alendronate] Other (See Comments)    "made me choke"  . Sulfa Antibiotics Other (See Comments)    Doesn't remember   . Tetracyclines & Related Rash   Patient Instructions     Bronchiectasis = you have scarring of your bronchial tubes which means that they don't function perfectly normally and mucus tends to pool in certain areas of your lung which can cause pneumonia and further scarring of your lung and bronchial tubes     Plan A = Automatic = Symbiocort 80 Take 2 puffs first thing in am and then another 2 puffs about 12 hours later.   Work on inhaler technique: relax and gently blow all the way out then take a nice smooth deep breath back in, triggering the inhaler at same time you start breathing in. Hold for up to 5 seconds if you can. Blow out thru nose. Rinse and gargle with water when done   Plan B = Backup Only use your albuterol as a rescue medication  - The less you use it, the better it will work when you need it. - Ok to use the inhaler up to 2 puffs every 4 hours if you must but call for appointment if use goes up over your usual need - Don't leave home without it !! (think of it like the spare tire for your car)   Plan C = Crisis - only use your albuterol nebulizer if you first try Plan B and it fails to help > ok to use the nebulizer up to every 4 hours but if start needing it regularly call for immediate appointment   For cough try mucinex 1200 mg every 12 hours as  needed / flutter valve as much as can   GERD (REFLUX) is an extremely common cause of respiratory symptoms just like yours , many times with no obvious heartburn at all.   It can be treated with medication, but also with lifestyle changes including elevation of the head of your bed (ideally with 6 inch bed blocks), Smoking cessation, avoidance of late meals, excessive alcohol, and avoid fatty foods, chocolate, peppermint, colas, red wine, and acidic juices such as orange juice.  NO MINT OR MENTHOL PRODUCTS SO NO COUGH DROPS  USE SUGARLESS CANDY INSTEAD (Jolley ranchers or Stover's or Life Savers) or even ice chips will also do - the key is to swallow to prevent all throat clearing. NO OIL BASED VITAMINS - use powdered substitutes.  We need to give you the Prevnar 13 if ok with Dr Marin Olp  Please schedule a follow up office visit in 4 weeks, sooner if needed - I will let Dr Marin Olp know we need to postpone your next Retuximab until we get your cough better

## 2016-04-01 NOTE — Telephone Encounter (Signed)
Dr Melvyn Novas please advise how you would like patient to proceed - would you like her to stop the Augmentin right now and start the Cipro right away or go ahead and complete the Augmentin then add in the Cipro? Please advise Dr Melvyn Novas. Thanks.

## 2016-04-01 NOTE — Assessment & Plan Note (Signed)
PFT's 03/2011:  FEV1 1.72 (82%), ratio 63, ++airtrapping, DLCO 108% pred AAT 2012: no s or z mutations noted. (therefore MM) 03/18/2016  After extensive coaching HFA effectiveness =   50% > try symbicort 80 2bid - flutter valve added 03/18/2016   Clearly this is not tyical astma and certainly copd in this never smoker and much better fit for obstructed bronchiectasis so need to may every effort to minimize recurrent steroid/ abx exposure by  1) max rx with mucinex/ flutter to help move mucus better   2) LABA / low dose ics best choice for any asthmatic component but will also improve MC  fxn  3) rx for gerd which is frequently assoc esp given that incessant cough is probably contributing to secondary gerd even if primary gerd is not an issue  4) Hold immunosuppression until see whether she'll respond to efforts to boost her immune/ barrier responses  5) Prevar 13 today   Each maintenance medication was reviewed in detail including most importantly the difference between maintenance and as needed and under what circumstances the prns are to be used.  Please see instructions for details which were reviewed in writing and the patient given a copy.    Total time devoted to counseling  = 35/79m review case with pt/husband discussion of options/alternatives/ personally creating written instructions  in presence of pt  then going over those specific  Instructions directly with the pt including how to use all of the meds but in particular covering each new medication in detail and the difference between the maintenance/automatic meds and the prns using an action plan format for the latter.

## 2016-04-02 ENCOUNTER — Telehealth: Payer: Self-pay | Admitting: Internal Medicine

## 2016-04-02 ENCOUNTER — Other Ambulatory Visit: Payer: Self-pay | Admitting: Internal Medicine

## 2016-04-02 MED ORDER — CIPROFLOXACIN HCL 500 MG PO TABS: 500.0000 mg | ORAL_TABLET | Freq: Two times a day (BID) | ORAL | Status: DC

## 2016-04-02 NOTE — Telephone Encounter (Signed)
Tanda Rockers, MD at 04/01/2016 4:53 PM     Status: Signed       Expand All Collapse All   Stop the augmentin now and start the cipro now       Called spoke with pt. She states that the cipro was not called into the pharmacy. Verified pharmacy as [facility]. She voiced understanding and had no further questions. Rx sent. Nothing further needed.

## 2016-04-05 ENCOUNTER — Other Ambulatory Visit: Payer: Self-pay | Admitting: Hematology & Oncology

## 2016-04-05 ENCOUNTER — Telehealth: Payer: Self-pay | Admitting: Internal Medicine

## 2016-04-05 DIAGNOSIS — D801 Nonfamilial hypogammaglobulinemia: Secondary | ICD-10-CM | POA: Insufficient documentation

## 2016-04-05 NOTE — Telephone Encounter (Signed)
Called and spoke with pt and she stated that she is still on cipro.  She stated that her oncologist stated that she needs to do an infusion for IVIG.  She wanted to make sure everyone was on the same page.  She does have appt with MW on 8-9 and didn't know if there is anything else that she needs to do.  Will start the infusion on Tuesday 8-2.  MW please advise. Thanks  Allergies  Allergen Reactions  . Actonel [Risedronate Sodium] Other (See Comments)    "made me choke"   . Fosamax [Alendronate] Other (See Comments)    "made me choke"  . Sulfa Antibiotics Other (See Comments)    Doesn't remember   . Tetracyclines & Related Rash

## 2016-04-05 NOTE — Telephone Encounter (Signed)
Ok - no change in rx

## 2016-04-06 ENCOUNTER — Ambulatory Visit: Payer: Medicare Other | Admitting: Pulmonary Disease

## 2016-04-06 NOTE — Telephone Encounter (Signed)
Called spoke with pt. Informed her that MW is aware and there is no change in rx. She voiced understanding and had no further questions.

## 2016-04-07 ENCOUNTER — Other Ambulatory Visit: Payer: Self-pay | Admitting: Physician Assistant

## 2016-04-07 ENCOUNTER — Other Ambulatory Visit: Payer: Self-pay | Admitting: Internal Medicine

## 2016-04-07 DIAGNOSIS — J4520 Mild intermittent asthma, uncomplicated: Secondary | ICD-10-CM

## 2016-04-07 MED ORDER — IPRATROPIUM BROMIDE 0.02 % IN SOLN: 0.5000 mg | Freq: Four times a day (QID) | RESPIRATORY_TRACT | 11 refills | Status: DC

## 2016-04-07 MED ORDER — ALBUTEROL SULFATE (2.5 MG/3ML) 0.083% IN NEBU: 2.5000 mg | INHALATION_SOLUTION | Freq: Four times a day (QID) | RESPIRATORY_TRACT | 11 refills | Status: DC

## 2016-04-07 MED ORDER — IPRATROPIUM-ALBUTEROL 0.5-2.5 (3) MG/3ML IN SOLN: 3.0000 mL | Freq: Four times a day (QID) | RESPIRATORY_TRACT | 0 refills | Status: DC | PRN

## 2016-04-14 ENCOUNTER — Ambulatory Visit (HOSPITAL_BASED_OUTPATIENT_CLINIC_OR_DEPARTMENT_OTHER): Payer: Medicare Other

## 2016-04-14 VITALS — BP 121/62 | HR 80 | Temp 98.0°F | Resp 20

## 2016-04-14 DIAGNOSIS — D819 Combined immunodeficiency, unspecified: Secondary | ICD-10-CM | POA: Diagnosis not present

## 2016-04-14 DIAGNOSIS — D801 Nonfamilial hypogammaglobulinemia: Secondary | ICD-10-CM

## 2016-04-14 DIAGNOSIS — C8312 Mantle cell lymphoma, intrathoracic lymph nodes: Secondary | ICD-10-CM

## 2016-04-14 MED ORDER — METHYLPREDNISOLONE SODIUM SUCC 125 MG IJ SOLR
125.0000 mg | Freq: Once | INTRAMUSCULAR | Status: AC
  Administered 2016-04-14: 125 mg via INTRAVENOUS

## 2016-04-14 MED ORDER — SODIUM CHLORIDE 0.9% FLUSH
10.0000 mL | INTRAVENOUS | Status: DC | PRN
  Administered 2016-04-14: 10 mL via INTRAVENOUS
  Filled 2016-04-14: qty 10

## 2016-04-14 MED ORDER — FAMOTIDINE IN NACL 20-0.9 MG/50ML-% IV SOLN
INTRAVENOUS | Status: AC
  Filled 2016-04-14: qty 100

## 2016-04-14 MED ORDER — IMMUNE GLOBULIN (HUMAN) 10 GM/100ML IV SOLN
40.0000 g | Freq: Once | INTRAVENOUS | Status: AC
  Administered 2016-04-14: 40 g via INTRAVENOUS
  Filled 2016-04-14: qty 400

## 2016-04-14 MED ORDER — FAMOTIDINE IN NACL 20-0.9 MG/50ML-% IV SOLN
40.0000 mg | Freq: Once | INTRAVENOUS | Status: AC
  Administered 2016-04-14: 40 mg via INTRAVENOUS

## 2016-04-14 MED ORDER — METHYLPREDNISOLONE SODIUM SUCC 125 MG IJ SOLR
INTRAMUSCULAR | Status: AC
  Filled 2016-04-14: qty 2

## 2016-04-14 MED ORDER — SODIUM CHLORIDE 0.9 % IV SOLN
INTRAVENOUS | Status: DC
  Administered 2016-04-14: 09:00:00 via INTRAVENOUS

## 2016-04-14 MED ORDER — HEPARIN SOD (PORK) LOCK FLUSH 100 UNIT/ML IV SOLN
500.0000 [IU] | Freq: Once | INTRAVENOUS | Status: AC
  Administered 2016-04-14: 500 [IU] via INTRAVENOUS
  Filled 2016-04-14: qty 5

## 2016-04-14 MED ORDER — SODIUM CHLORIDE 0.9 % IV SOLN: 40.0000 mg | Freq: Once | INTRAVENOUS | Status: DC

## 2016-04-14 NOTE — Patient Instructions (Signed)

## 2016-04-20 ENCOUNTER — Ambulatory Visit: Payer: Medicare Other | Admitting: Emergency Medicine

## 2016-04-21 ENCOUNTER — Encounter: Payer: Self-pay | Admitting: Internal Medicine

## 2016-04-21 ENCOUNTER — Ambulatory Visit (INDEPENDENT_AMBULATORY_CARE_PROVIDER_SITE_OTHER): Payer: Medicare Other | Admitting: Internal Medicine

## 2016-04-21 VITALS — BP 122/70 | HR 97 | Ht 64.0 in | Wt 118.8 lb

## 2016-04-21 DIAGNOSIS — J471 Bronchiectasis with (acute) exacerbation: Secondary | ICD-10-CM

## 2016-04-21 DIAGNOSIS — J479 Bronchiectasis, uncomplicated: Secondary | ICD-10-CM

## 2016-04-21 MED ORDER — BUDESONIDE-FORMOTEROL FUMARATE 80-4.5 MCG/ACT IN AERO: INHALATION_SPRAY | RESPIRATORY_TRACT | 11 refills | Status: DC

## 2016-04-21 NOTE — Patient Instructions (Addendum)
Please see patient coordinator before you leave today  to schedule sinus CT   Keep using the flutter valve as much as possible   If you think  symbicort 80 is helping then continue 80 Take 2 puffs first thing in am and then another 2 puffs about 12 hours later.     Call med if the mucus gets really nasty again and we can meet you at the hospital for bronchoscopy

## 2016-04-21 NOTE — Progress Notes (Signed)
Subjective:     Patient ID: Shannon Parsons, female   DOB: 01-03-1941     MRN: [id number]    Brief patient profile:  17 yowf never smoker with sev bad childhood pneumonia's and asthma but seemed outgrow it until in 1970s with assoc nasal/chest congestions/ cough >    took allergy shots x 5 years and much better except for spells of head cold going into chest not related to seasons  with sev visits per year s chronic rx and prev eval by Clance who recorded it was worse in spring and fall and rec maint rx with qvar and rec try singulair but she never did either and referred to pulmonary clinic 03/18/2016 by Dr Vicie Mutters back to pulmonary clinic     History of Present Illness  03/18/2016 1st [facility] office visit/ Shannon Parsons  On Retuximab  And prn  saba only  Chief Complaint  Patient presents with  . Pulmonary Consult    Referred by Vicie Mutters. Pt c/o cough since April 2017- prod with yellow sputm. Cough is worse about 3 hours after she wakes up in the am.   symptoms of cough/ congestion/ wheezing/ rattling x years but much worse since mid April 2017 rx with muliptle courses of abx and prednisone   Cough is worse in am's x 3 h - no flutter valve / only using saba maybe once a day, very poor insight into hx/ meds / prev instructions rec Bronchiectasis =   you have scarring of your bronchial tubes   Plan A = Automatic = Symbiocort 80 Take 2 puffs first thing in am and then another 2 puffs about 12 hours later.  Work on inhaler technique:   Plan B = Backup Only use your albuterol as a rescue medication  Plan C = Crisis - only use your albuterol nebulizer if you first try Plan B  For cough try mucinex 1200 mg every 12 hours as needed / flutter valve as much as can  GERD (REFLUX) diet   04/01/16 Cipro x 5 days  04/21/2016  f/u ov/Osamu Olguin re:  Chief Complaint  Patient presents with  . Follow-up    4wk rov. breathing has slightly improved. c/o cont sob w/exertion, prod cough w/yellowish  mucus & wheezing mainly w/laying flat  Could not tol symbicort at 2 bid dose/ cipro x 8-9 pills  Only, nonspecific intol to both>  no change in color of mucus while on it but better since change meds per Dr Marin Olp (added IVIG 8/2) Last cipro was around 04/06/16 Feels flutter really helping when uses it    No obvious day to day or daytime variability or assoc  or cp or chest tightness,  or overt sinus or hb symptoms. No unusual exp hx or h/o childhood pna/ asthma or knowledge of premature birth.    Also denies any obvious fluctuation of symptoms with weather or environmental changes or other aggravating or alleviating factors except as outlined above   Current Medications, Allergies, Complete Past Medical History, Past Surgical History, Family History, and Social History were reviewed in [employer] record.  ROS  The following are not active complaints unless bolded sore throat, dysphagia, dental problems, itching, sneezing,  nasal congestion or excess/ purulent secretions, ear ache,   fever, chills, sweats, unintended wt loss, classically pleuritic or exertional cp, hemoptysis,  orthopnea pnd or leg swelling, presyncope, palpitations, abdominal pain, anorexia, nausea, vomiting, diarrhea  or change in bowel or bladder habits, change in  stools or urine, dysuria,hematuria,  rash, arthralgias, visual complaints, headache, numbness, weakness or ataxia or problems with walking or coordination,  change in mood/affect or memory.               Objective:   Physical Exam  amb wf  congested /rattling cough    04/21/2016         118   03/18/16 121 lb (54.885 kg)  02/19/16 120 lb (54.432 kg)  02/03/16 118 lb (53.524 kg)    Vital signs reviewed   HEENT: nl dentition, turbinates, and oropharynx. Nl external ear canals without cough reflex   NECK :  without JVD/Nodes/TM/ nl carotid upstrokes bilaterally   LUNGS: no acc muscle use,  Nl contour chest with mid to late exp  bilateral  Rhonchi   CV:  RRR  no s3 or murmur or increase in P2, no edema   ABD:  soft and nontender with nl inspiratory excursion in the supine position. No bruits or organomegaly, bowel sounds nl  MS:  Nl gait/ ext warm without deformities, calf tenderness, cyanosis or clubbing No obvious joint restrictions   SKIN: warm and dry without lesions    NEURO:  alert, approp, nl sensorium with  no motor deficits     I personally reviewed images and agree with radiology impression as follows:  CT Chest    W/o contrast  02/25/16  Diffuse mild cylindrical bronchiectasis throughout both lungs. Patchy tree-in-bud opacities and areas of mucoid impaction in both lungs, most prominent in the basilar right upper lobe       Assessment:

## 2016-04-22 ENCOUNTER — Encounter: Payer: Self-pay | Admitting: Internal Medicine

## 2016-04-22 NOTE — Assessment & Plan Note (Addendum)
PFT's 03/2011:  FEV1 1.72 (82%), ratio 63, ++airtrapping, DLCO 108% pred AAT 2012: no s or z mutations noted. (therefore MM) 03/18/2016  After extensive coaching HFA effectiveness =   50% > try symbicort 80 2bid - flutter valve added 03/18/2016  - PC 04/01/2016  Green sputum on augmentin > rec Cipro 500 mg bid x 10 days  > could not tol/ non-specific symptoms - started IVIG  04/14/16 > improved at f/u ov 04/21/2016   I had an extended discussion with the patient reviewing all relevant studies completed to date and  lasting 15 to 20 minutes of a 25 minute visit on the following ongoing concerns:   So far she is better from addition of IVIG than any abx she previously received  We will focus in the pulmonary clinic on [facility] dysfunction so rec rechallenge with symbiort 80 2bid to see if has a reproducible complaint that would suggest true intolerance and hold further abx   For now rec continue flutter/ mucinex consider VEST/ and eval for ? Underlying sinusitis > ct sinus ordered/ ent f/u prn   If mucus gets really purulent again best option if fob/bal to get a better idea of specific organisms rather than just to continue ineffective and poorly tol abx   Each maintenance medication was reviewed in detail including most importantly the difference between maintenance and as needed and under what circumstances the prns are to be used.  Please see instructions for details which were reviewed in writing and the patient given a copy.

## 2016-04-23 ENCOUNTER — Ambulatory Visit (INDEPENDENT_AMBULATORY_CARE_PROVIDER_SITE_OTHER)
Admission: RE | Admit: 2016-04-23 | Discharge: 2016-04-23 | Disposition: A | Discharge status: Home / Self Care | Payer: Medicare Other | Source: Ambulatory Visit | Attending: Internal Medicine | Admitting: Internal Medicine

## 2016-04-23 DIAGNOSIS — J471 Bronchiectasis with (acute) exacerbation: Secondary | ICD-10-CM | POA: Diagnosis not present

## 2016-04-23 DIAGNOSIS — J479 Bronchiectasis, uncomplicated: Secondary | ICD-10-CM

## 2016-04-26 ENCOUNTER — Telehealth: Payer: Self-pay

## 2016-04-26 DIAGNOSIS — R930 Abnormal findings on diagnostic imaging of skull and head, not elsewhere classified: Secondary | ICD-10-CM

## 2016-04-26 NOTE — Telephone Encounter (Signed)
Tanda Rockers, MD  Rosana Berger, CMA        Let her know sinuses may be the cause for her refractory cough > refer to ENT    Spoke with pt, aware of results/recs.  Pt has requested to be referred back to Dr. Constance Holster.  Referral placed.  Nothing further needed.

## 2016-05-06 DIAGNOSIS — R251 Tremor, unspecified: Secondary | ICD-10-CM | POA: Insufficient documentation

## 2016-05-06 DIAGNOSIS — J479 Bronchiectasis, uncomplicated: Secondary | ICD-10-CM | POA: Insufficient documentation

## 2016-05-06 DIAGNOSIS — H6123 Impacted cerumen, bilateral: Secondary | ICD-10-CM | POA: Insufficient documentation

## 2016-05-10 ENCOUNTER — Ambulatory Visit (HOSPITAL_BASED_OUTPATIENT_CLINIC_OR_DEPARTMENT_OTHER): Payer: Medicare Other

## 2016-05-10 ENCOUNTER — Ambulatory Visit (HOSPITAL_BASED_OUTPATIENT_CLINIC_OR_DEPARTMENT_OTHER): Payer: Medicare Other | Admitting: Hematology & Oncology

## 2016-05-10 ENCOUNTER — Other Ambulatory Visit (HOSPITAL_BASED_OUTPATIENT_CLINIC_OR_DEPARTMENT_OTHER): Payer: Medicare Other

## 2016-05-10 VITALS — BP 112/62 | HR 76 | Temp 98.1°F | Resp 18

## 2016-05-10 VITALS — BP 115/63 | HR 73 | Temp 98.2°F | Resp 16 | Wt 120.0 lb

## 2016-05-10 DIAGNOSIS — C8310 Mantle cell lymphoma, unspecified site: Secondary | ICD-10-CM | POA: Diagnosis not present

## 2016-05-10 DIAGNOSIS — D801 Nonfamilial hypogammaglobulinemia: Secondary | ICD-10-CM

## 2016-05-10 DIAGNOSIS — C8312 Mantle cell lymphoma, intrathoracic lymph nodes: Secondary | ICD-10-CM

## 2016-05-10 DIAGNOSIS — C8319 Mantle cell lymphoma, extranodal and solid organ sites: Secondary | ICD-10-CM

## 2016-05-10 DIAGNOSIS — J479 Bronchiectasis, uncomplicated: Secondary | ICD-10-CM

## 2016-05-10 DIAGNOSIS — Z5112 Encounter for antineoplastic immunotherapy: Secondary | ICD-10-CM | POA: Diagnosis not present

## 2016-05-10 DIAGNOSIS — J471 Bronchiectasis with (acute) exacerbation: Secondary | ICD-10-CM | POA: Diagnosis not present

## 2016-05-10 DIAGNOSIS — C8313 Mantle cell lymphoma, intra-abdominal lymph nodes: Secondary | ICD-10-CM

## 2016-05-10 LAB — CBC WITH DIFFERENTIAL (CANCER CENTER ONLY)
BASO#: 0 10*3/uL (ref 0.0–0.2)
BASO%: 1.1 % (ref 0.0–2.0)
EOS%: 4.9 % (ref 0.0–7.0)
Eosinophils Absolute: 0.2 10*3/uL (ref 0.0–0.5)
HCT: 36.6 % (ref 34.8–46.6)
HGB: 12.5 g/dL (ref 11.6–15.9)
LYMPH#: 0.5 10*3/uL — ABNORMAL LOW (ref 0.9–3.3)
LYMPH%: 12.6 % — AB (ref 14.0–48.0)
MCH: 31.3 pg (ref 26.0–34.0)
MCHC: 34.2 g/dL (ref 32.0–36.0)
MCV: 92 fL (ref 81–101)
MONO#: 0.7 10*3/uL (ref 0.1–0.9)
MONO%: 19 % — AB (ref 0.0–13.0)
NEUT#: 2.3 10*3/uL (ref 1.5–6.5)
NEUT%: 62.4 % (ref 39.6–80.0)
PLATELETS: 201 10*3/uL (ref 145–400)
RBC: 4 10*6/uL (ref 3.70–5.32)
RDW: 13.4 % (ref 11.1–15.7)
WBC: 3.6 10*3/uL — AB (ref 3.9–10.0)

## 2016-05-10 LAB — CMP (CANCER CENTER ONLY)
ALK PHOS: 59 U/L (ref 26–84)
ALT: 19 U/L (ref 10–47)
AST: 27 U/L (ref 11–38)
Albumin: 3.1 g/dL — ABNORMAL LOW (ref 3.3–5.5)
BILIRUBIN TOTAL: 0.8 mg/dL (ref 0.20–1.60)
BUN: 12 mg/dL (ref 7–22)
CO2: 29 mEq/L (ref 18–33)
CREATININE: 0.9 mg/dL (ref 0.6–1.2)
Calcium: 9.2 mg/dL (ref 8.0–10.3)
Chloride: 105 mEq/L (ref 98–108)
GLUCOSE: 93 mg/dL (ref 73–118)
POTASSIUM: 4.1 meq/L (ref 3.3–4.7)
Sodium: 136 mEq/L (ref 128–145)
TOTAL PROTEIN: 6.1 g/dL — AB (ref 6.4–8.1)

## 2016-05-10 LAB — LACTATE DEHYDROGENASE: LDH: 174 U/L (ref 125–245)

## 2016-05-10 MED ORDER — SODIUM CHLORIDE 0.9 % IV SOLN
INTRAVENOUS | Status: DC
  Administered 2016-05-10: 09:00:00 via INTRAVENOUS

## 2016-05-10 MED ORDER — SODIUM CHLORIDE 0.9 % IV SOLN
375.0000 mg/m2 | Freq: Once | INTRAVENOUS | Status: AC
  Administered 2016-05-10: 600 mg via INTRAVENOUS
  Filled 2016-05-10: qty 60

## 2016-05-10 MED ORDER — IMMUNE GLOBULIN (HUMAN) 20 GM/200ML IV SOLN
40.0000 g | Freq: Once | INTRAVENOUS | Status: AC
  Administered 2016-05-10: 40 g via INTRAVENOUS
  Filled 2016-05-10: qty 400

## 2016-05-10 MED ORDER — DIPHENHYDRAMINE HCL 25 MG PO CAPS
50.0000 mg | ORAL_CAPSULE | Freq: Once | ORAL | Status: AC
  Administered 2016-05-10: 50 mg via ORAL

## 2016-05-10 MED ORDER — SODIUM CHLORIDE 0.9% FLUSH
10.0000 mL | INTRAVENOUS | Status: DC | PRN
  Filled 2016-05-10: qty 10

## 2016-05-10 MED ORDER — FAMOTIDINE IN NACL 20-0.9 MG/50ML-% IV SOLN
40.0000 mg | Freq: Once | INTRAVENOUS | Status: AC
  Administered 2016-05-10: 40 mg via INTRAVENOUS

## 2016-05-10 MED ORDER — METHYLPREDNISOLONE SODIUM SUCC 125 MG IJ SOLR
125.0000 mg | Freq: Once | INTRAMUSCULAR | Status: AC
  Administered 2016-05-10: 125 mg via INTRAVENOUS

## 2016-05-10 MED ORDER — IMMUNE GLOBULIN (HUMAN) 5 GM/100ML IV SOLN: 40.0000 g | Freq: Once | INTRAVENOUS | Status: DC

## 2016-05-10 MED ORDER — ACETAMINOPHEN 325 MG PO TABS
650.0000 mg | ORAL_TABLET | Freq: Once | ORAL | Status: AC
  Administered 2016-05-10: 650 mg via ORAL

## 2016-05-10 MED ORDER — HEPARIN SOD (PORK) LOCK FLUSH 100 UNIT/ML IV SOLN
500.0000 [IU] | Freq: Once | INTRAVENOUS | Status: AC
  Administered 2016-05-10: 500 [IU] via INTRAVENOUS
  Filled 2016-05-10: qty 5

## 2016-05-10 MED ORDER — SODIUM CHLORIDE 0.9 % IV SOLN: 40.0000 mg | Freq: Once | INTRAVENOUS | Status: DC

## 2016-05-10 MED ORDER — DIPHENHYDRAMINE HCL 25 MG PO CAPS
ORAL_CAPSULE | ORAL | Status: AC
  Filled 2016-05-10: qty 2

## 2016-05-10 MED ORDER — ACETAMINOPHEN 325 MG PO TABS
ORAL_TABLET | ORAL | Status: AC
  Filled 2016-05-10: qty 2

## 2016-05-10 MED ORDER — FAMOTIDINE IN NACL 20-0.9 MG/50ML-% IV SOLN
INTRAVENOUS | Status: AC
  Filled 2016-05-10: qty 100

## 2016-05-10 MED ORDER — METHYLPREDNISOLONE SODIUM SUCC 125 MG IJ SOLR
INTRAMUSCULAR | Status: AC
  Filled 2016-05-10: qty 2

## 2016-05-10 NOTE — Progress Notes (Signed)
Hematology and Oncology Follow Up Visit  Shannon Parsons [id number] [birth year] 75 y.o. 05/10/2016   Principle Diagnosis:   Mantle cell lymphoma (MCIPI = 5)  Thromboembolic event of the right leg  Acquired Hypogammaglobulinemia  Bronchiectasis  Current Therapy:    Rituxan - maintanence - s/p cycle #4 - on hold secondary to bronchiectasis  IVIG - q 2 months     Interim History:  [name] is back for follow-up. She seems to be doing much better with her bronchiectasis. She is not coughing nearly as much.  We did go ahead and give her IVIG we last saw her. Her immunoglobulin levels were on the low side. I'm sure this is from her having the mantle cell lymphoma. After the IVIG was given, she felt a whole lot better. Her cough went down.  She is very worried about her mantle cell lymphoma. I realize that this certainly could recur. I told her that there is no evidence that doing routine PET scans will help. I told her that if she has recurrence, that she probably will know it herself.  She is eating pretty well. She's had no nausea or vomiting. She's had no change in bowel or bladder habits. There's been no leg swelling. She's had no fever. She's had no sweats. There's been no rashes.  She's had no leg swelling. She's had no joint problems.  Overall, her performance status is ECOG 1.  Medications:  Current Outpatient Prescriptions:  .  acetaminophen (TYLENOL) 325 MG tablet, Take 325 mg by mouth every 6 (six) hours as needed (For pain.). , Disp: , Rfl:  .  albuterol (PROVENTIL HFA;VENTOLIN HFA) 108 (90 BASE) MCG/ACT inhaler, Inhale 2 puffs into the lungs every 6 (six) hours as needed for wheezing or shortness of breath (cough)., Disp: 1 Inhaler, Rfl: 0 .  albuterol (PROVENTIL) (2.5 MG/3ML) 0.083% nebulizer solution, Take 3 mLs (2.5 mg total) by nebulization 4 (four) times daily., Disp: 360 mL, Rfl: 11 .  ALPRAZolam (XANAX) 0.5 MG tablet, Take 0.5 mg by mouth at bedtime as  needed for anxiety., Disp: , Rfl:  .  budesonide-formoterol (SYMBICORT) 80-4.5 MCG/ACT inhaler, Take 2 puffs first thing in am and then another 2 puffs about 12 hours later., Disp: 1 Inhaler, Rfl: 11 .  Cholecalciferol (VITAMIN D) 2000 UNITS CAPS, Take 2,000-4,000 capsules by mouth every other day. Patient alternates 1 cap with 2 caps every other day., Disp: , Rfl:  .  hyoscyamine (LEVSIN, ANASPAZ) 0.125 MG tablet, Take 1 to 2 tablets every 4 hours as needed for nausea, cramping, bloating or diarrhea, Disp: 90 tablet, Rfl: 11 .  ipratropium (ATROVENT) 0.02 % nebulizer solution, Take 2.5 mLs (0.5 mg total) by nebulization 4 (four) times daily., Disp: 300 mL, Rfl: 11 .  ipratropium-albuterol (DUONEB) 0.5-2.5 (3) MG/3ML SOLN, Take 3 mLs by nebulization every 6 (six) hours as needed., Disp: 360 mL, Rfl: 0 .  levothyroxine (SYNTHROID, LEVOTHROID) 50 MCG tablet, TAKE 1 TABLET BY MOUTH ONCE DAILY., Disp: 90 tablet, Rfl: 3 .  lidocaine-prilocaine (EMLA) cream, Apply 1 application topically as needed. Apply to [facility] one hour before access., Disp: 30 g, Rfl: 3 .  Multiple Vitamin (MULTIVITAMIN WITH MINERALS) TABS tablet, Take 1 tablet by mouth daily., Disp: , Rfl:  .  Probiotic Product (PROBIOTIC DAILY PO), Take 1 capsule by mouth daily., Disp: , Rfl:  .  promethazine-dextromethorphan (PROMETHAZINE-DM) 6.25-15 MG/5ML syrup, Take 5-10 mL PO q8hrs prn for cold symptoms, Disp: 360 mL, Rfl: 1 .  Respiratory Therapy Supplies (FLUTTER) DEVI, Take as directed, Disp: 1 each, Rfl: 0 .  vitamin B-12 (CYANOCOBALAMIN) 1000 MCG tablet, Take 1,000 mcg by mouth daily., Disp: , Rfl:  No current facility-administered medications for this visit.   Facility-Administered Medications Ordered in Other Visits:  .  0.9 %  sodium chloride infusion, , Intravenous, Continuous, Volanda Napoleon, MD, Last Rate: 20 mL/hr at 05/10/16 0912 .  Immune Globulin 10% (PRIVIGEN) IV infusion 40 g, 40 g, Intravenous, Once, Volanda Napoleon,  MD  Allergies:  Allergies  Allergen Reactions  . Actonel [Risedronate Sodium] Other (See Comments)    "made me choke"   . Fosamax [Alendronate] Other (See Comments)    "made me choke"  . Sulfa Antibiotics Other (See Comments)    Doesn't remember   . Tetracyclines & Related Rash    Past Medical History, Surgical history, Social history, and Family History were reviewed and updated.  Review of Systems: As above  Physical Exam:  weight is 120 lb (54.4 kg). Her oral temperature is 98.2 F (36.8 C). Her blood pressure is 115/63 and her pulse is 73. Her respiration is 16.   Thin but fairly well-nourished white female. Head and neck exam shows no ocular or oral lesions. She has no bilateral cervical or supraclavicular lymph nodes. She has no palpable thyroid. Lungs are clear. Cardiac exam regular rate and rhythm with no murmurs, rubs or bruits. Axillary exam shows no obvious bilateral axillary adenopathy. Abdomen is soft. She has decent bowel sounds. There is no fluid wave. Her spleen tip is just below the left costal margin. There is no hepatomegaly. Back exam shows no tenderness over the spine, ribs or hips. Extremities shows no clubbing, cyanosis or edema. No palpable venous cord is noted in the right leg. She has good range of motion of her joints. She has good strength in her muscles. Neurological exam is nonfocal. Skin exam shows no rashes, ecchymoses or petechia  Lab Results  Component Value Date   WBC 3.6 (L) 05/10/2016   HGB 12.5 05/10/2016   HCT 36.6 05/10/2016   MCV 92 05/10/2016   PLT 201 05/10/2016     Chemistry      Component Value Date/Time   NA 136 05/10/2016 0848   NA 138 08/01/2015 1124   K 4.1 05/10/2016 0848   K 4.0 08/01/2015 1124   CL 105 05/10/2016 0848   CO2 29 05/10/2016 0848   CO2 28 08/01/2015 1124   BUN 12 05/10/2016 0848   BUN 10.4 08/01/2015 1124   CREATININE 0.9 05/10/2016 0848   CREATININE 0.8 08/01/2015 1124      Component Value Date/Time    CALCIUM 9.2 05/10/2016 0848   CALCIUM 9.6 08/01/2015 1124   ALKPHOS 59 05/10/2016 0848   ALKPHOS 88 08/01/2015 1124   AST 27 05/10/2016 0848   AST 23 08/01/2015 1124   ALT 19 05/10/2016 0848   ALT 12 08/01/2015 1124   BILITOT 0.80 05/10/2016 0848   BILITOT <0.30 08/01/2015 1124         Impression and Plan: Shannon Parsons is 75 year old white female. She has mantle cell lymphoma. Again, by the [facility] she is an intermediate risk.  She has responded very well. Again, we have to watch out for recurrence. She is at risk for recurrence.  I will go ahead and give her Rituxan today. I'll also give her IVIG. I think that both will be very helpful for her.  Again, the bronchiectasis has responded nicely to  the IVIG. I want to make sure we continue this.   I will plan to get her back in 2 more months. Again we can do both IVIG and Rituxan the same day to make things easy or for her.    Volanda Napoleon, MD 8/28/20179:47 AM

## 2016-05-10 NOTE — Patient Instructions (Signed)
Immune Globulin Injection What is this medicine? IMMUNE GLOBULIN (im MUNE GLOB yoo lin) helps to prevent or reduce the severity of certain infections in patients who are at risk. This medicine is collected from the pooled blood of many donors. It is used to treat immune system problems, thrombocytopenia, and Kawasaki syndrome. This medicine may be used for other purposes; ask your health care provider or pharmacist if you have questions. What should I tell my health care provider before I take this medicine? They need to know if you have any of these conditions: - diabetes - extremely low or no immune antibodies in the blood - heart disease - history of blood clots - hyperprolinemia - infection in the blood, sepsis - kidney disease - taking medicine that may change kidney function - ask your health care provider about your medicine - an unusual or allergic reaction to human immune globulin, albumin, maltose, sucrose, polysorbate 80, other medicines, foods, dyes, or preservatives - pregnant or trying to get pregnant - breast-feeding How should I use this medicine? This medicine is for injection into a muscle or infusion into a vein or skin. It is usually given by a health care professional in a hospital or clinic setting. In rare cases, some brands of this medicine might be given at home. You will be taught how to give this medicine. Use exactly as directed. Take your medicine at regular intervals. Do not take your medicine more often than directed. Talk to your pediatrician regarding the use of this medicine in children. Special care may be needed. Overdosage: If you think you have taken too much of this medicine contact a poison control center or emergency room at once. NOTE: This medicine is only for you. Do not share this medicine with others. What if I miss a dose? It is important not to miss your dose. Call your doctor or health care professional if you are unable to keep an  appointment. If you give yourself the medicine and you miss a dose, take it as soon as you can. If it is almost time for your next dose, take only that dose. Do not take double or extra doses. What may interact with this medicine? -aspirin and aspirin-like medicines -cisplatin -cyclosporine -medicines for infection like acyclovir, adefovir, amphotericin B, bacitracin, cidofovir, foscarnet, ganciclovir, gentamicin, pentamidine, vancomycin -NSAIDS, medicines for pain and inflammation, like ibuprofen or naproxen -pamidronate -vaccines -zoledronic acid This list may not describe all possible interactions. Give your health care provider a list of all the medicines, herbs, non-prescription drugs, or dietary supplements you use. Also tell them if you smoke, drink alcohol, or use illegal drugs. Some items may interact with your medicine. What should I watch for while using this medicine? Your condition will be monitored carefully while you are receiving this medicine. This medicine is made from pooled blood donations of many different people. It may be possible to pass an infection in this medicine. However, the donors are screened for infections and all products are tested for HIV and hepatitis. The medicine is treated to kill most or all bacteria and viruses. Talk to your doctor about the risks and benefits of this medicine. Do not have vaccinations for at least 14 days before, or until at least 3 months after receiving this medicine. What side effects may I notice from receiving this medicine? Side effects that you should report to your doctor or health care professional as soon as possible: -allergic reactions like skin rash, itching or hives, swelling of   the face, lips, or tongue -breathing problems -chest pain or tightness -fever, chills -headache with nausea, vomiting -neck pain or difficulty moving neck -pain when moving eyes -pain, swelling, warmth in the leg -problems with balance,  talking, walking -sudden weight gain -swelling of the ankles, feet, hands -trouble passing urine or change in the amount of urine Side effects that usually do not require medical attention (report to your doctor or health care professional if they continue or are bothersome): -dizzy, drowsy -flushing -increased sweating -leg cramps -muscle aches and pains -pain at site where injected This list may not describe all possible side effects. Call your doctor for medical advice about side effects. You may report side effects to FDA at 1-800-FDA-1088. Where should I keep my medicine? Keep out of the reach of children. This drug is usually given in a hospital or clinic and will not be stored at home. In rare cases, some brands of this medicine may be given at home. If you are using this medicine at home, you will be instructed on how to store this medicine. Throw away any unused medicine after the expiration date on the label. NOTE: This sheet is a summary. It may not cover all possible information. If you have questions about this medicine, talk to your doctor, pharmacist, or health care provider.    2016, Elsevier/Gold Standard. (2008-11-20 11:44:49)   Rituximab injection What is this medicine? RITUXIMAB (ri TUX i mab) is a monoclonal antibody. It is used commonly to treat non-Hodgkin lymphoma and other conditions. It is also used to treat rheumatoid arthritis (RA). In RA, this medicine slows the inflammatory process and help reduce joint pain and swelling. This medicine is often used with other cancer or arthritis medications. This medicine may be used for other purposes; ask your health care provider or pharmacist if you have questions. What should I tell my health care provider before I take this medicine? They need to know if you have any of these conditions: -blood disorders -heart disease -history of hepatitis B -infection (especially a virus infection such as chickenpox, cold sores,  or herpes) -irregular heartbeat -kidney disease -lung or breathing disease, like asthma -lupus -an unusual or allergic reaction to rituximab, mouse proteins, other medicines, foods, dyes, or preservatives -pregnant or trying to get pregnant -breast-feeding How should I use this medicine? This medicine is for infusion into a vein. It is administered in a hospital or clinic by a specially trained health care professional. A special MedGuide will be given to you by the pharmacist with each prescription and refill. Be sure to read this information carefully each time. Talk to your pediatrician regarding the use of this medicine in children. This medicine is not approved for use in children. Overdosage: If you think you have taken too much of this medicine contact a poison control center or emergency room at once. NOTE: This medicine is only for you. Do not share this medicine with others. What if I miss a dose? It is important not to miss a dose. Call your doctor or health care professional if you are unable to keep an appointment. What may interact with this medicine? -cisplatin -medicines for blood pressure -some other medicines for arthritis -vaccines This list may not describe all possible interactions. Give your health care provider a list of all the medicines, herbs, non-prescription drugs, or dietary supplements you use. Also tell them if you smoke, drink alcohol, or use illegal drugs. Some items may interact with your medicine. What should I  watch for while using this medicine? Report any side effects that you notice during your treatment right away, such as changes in your breathing, fever, chills, dizziness or lightheadedness. These effects are more common with the first dose. Visit your prescriber or health care professional for checks on your progress. You will need to have regular blood work. Report any other side effects. The side effects of this medicine can continue after you  finish your treatment. Continue your course of treatment even though you feel ill unless your doctor tells you to stop. Call your doctor or health care professional for advice if you get a fever, chills or sore throat, or other symptoms of a cold or flu. Do not treat yourself. This drug decreases your body's ability to fight infections. Try to avoid being around people who are sick. This medicine may increase your risk to bruise or bleed. Call your doctor or health care professional if you notice any unusual bleeding. Be careful brushing and flossing your teeth or using a toothpick because you may get an infection or bleed more easily. If you have any dental work done, tell your dentist you are receiving this medicine. Avoid taking products that contain aspirin, acetaminophen, ibuprofen, naproxen, or ketoprofen unless instructed by your doctor. These medicines may hide a fever. Do not become pregnant while taking this medicine. Women should inform their doctor if they wish to become pregnant or think they might be pregnant. There is a potential for serious side effects to an unborn child. Talk to your health care professional or pharmacist for more information. Do not breast-feed an infant while taking this medicine. What side effects may I notice from receiving this medicine? Side effects that you should report to your doctor or health care professional as soon as possible: -allergic reactions like skin rash, itching or hives, swelling of the face, lips, or tongue -low blood counts - this medicine may decrease the number of white blood cells, red blood cells and platelets. You may be at increased risk for infections and bleeding. -signs of infection - fever or chills, cough, sore throat, pain or difficulty passing urine -signs of decreased platelets or bleeding - bruising, pinpoint red spots on the skin, black, tarry stools, blood in the urine -signs of decreased red blood cells - unusually weak or  tired, fainting spells, lightheadedness -breathing problems -confused, not responsive -chest pain -fast, irregular heartbeat -feeling faint or lightheaded, falls -mouth sores -redness, blistering, peeling or loosening of the skin, including inside the mouth -stomach pain -swelling of the ankles, feet, or hands -trouble passing urine or change in the amount of urine Side effects that usually do not require medical attention (report to your doctor or other health care professional if they continue or are bothersome): -anxiety -headache -loss of appetite -muscle aches -nausea -night sweats This list may not describe all possible side effects. Call your doctor for medical advice about side effects. You may report side effects to FDA at 1-800-FDA-1088. Where should I keep my medicine? This drug is given in a hospital or clinic and will not be stored at home. NOTE: This sheet is a summary. It may not cover all possible information. If you have questions about this medicine, talk to your doctor, pharmacist, or health care provider.    2016, Elsevier/Gold Standard. (2014-11-06 22:30:56)

## 2016-05-11 LAB — IGG, IGA, IGM
IGM (IMMUNOGLOBIN M), SRM: 8 mg/dL — AB (ref 26–217)
IgA, Qn, Serum: 42 mg/dL — ABNORMAL LOW (ref 64–422)
IgG, Qn, Serum: 853 mg/dL (ref 700–1600)

## 2016-05-19 ENCOUNTER — Telehealth: Payer: Self-pay | Admitting: *Deleted

## 2016-05-19 NOTE — Telephone Encounter (Signed)
PAtient called asking if she is ok to have dental cleaning and fillings now.  Dr. Marin Olp states yes it is fine for her to do so.  Patient aware

## 2016-05-24 ENCOUNTER — Other Ambulatory Visit: Payer: Self-pay | Admitting: Internal Medicine

## 2016-05-24 DIAGNOSIS — Z1231 Encounter for screening mammogram for malignant neoplasm of breast: Secondary | ICD-10-CM

## 2016-05-26 ENCOUNTER — Encounter: Payer: Self-pay | Admitting: *Deleted

## 2016-05-26 ENCOUNTER — Telehealth: Payer: Self-pay | Admitting: *Deleted

## 2016-05-26 NOTE — Telephone Encounter (Signed)
Received call from Dentist office statin gpatient is there for crowns and fillings.  Asking if ok to proceed.  Dr. Marin Olp states patient needs to be on Amoxicillin pre procedure.  Staff notified.

## 2016-06-09 IMAGING — US US ABDOMEN COMPLETE
1 series · 14 of 25 positions shown · non-contrast
Comparison: CT abdomen and pelvis January 01, 2014

CLINICAL DATA: Epigastric region pain and elevated lipase

EXAM:
ULTRASOUND ABDOMEN COMPLETE

[Series 1: us abdomen complete · 0.20mm/px · 14 of 66 slices shown]
[im 1/66]
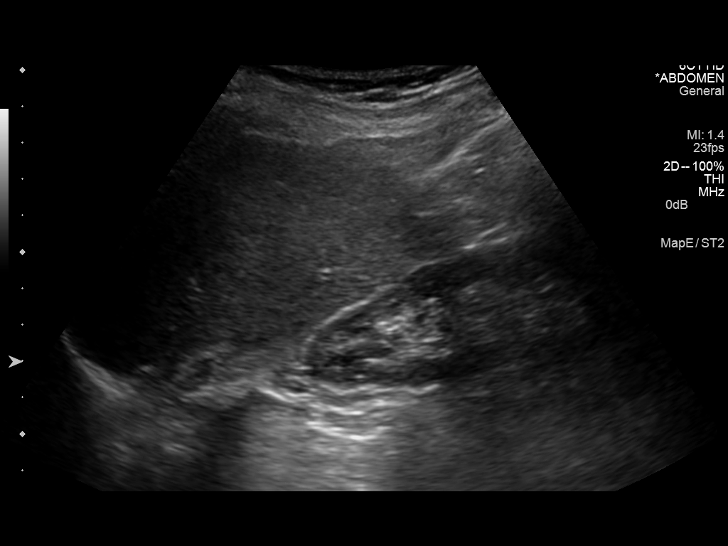
[im 6/66]
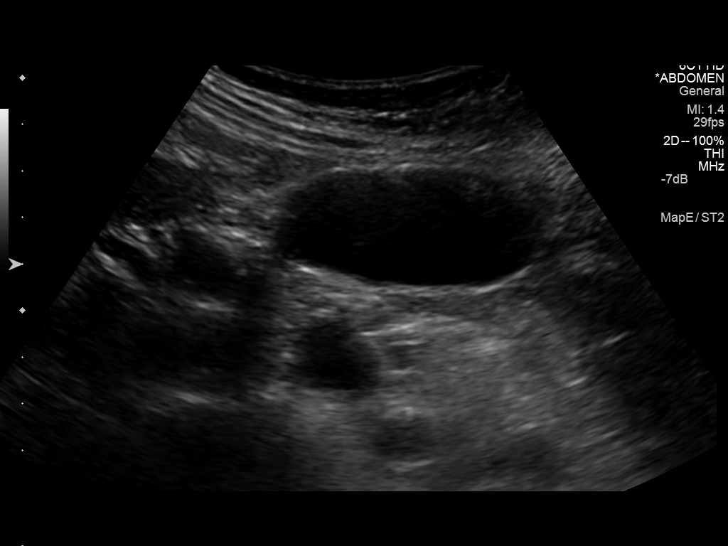
[im 11/66]
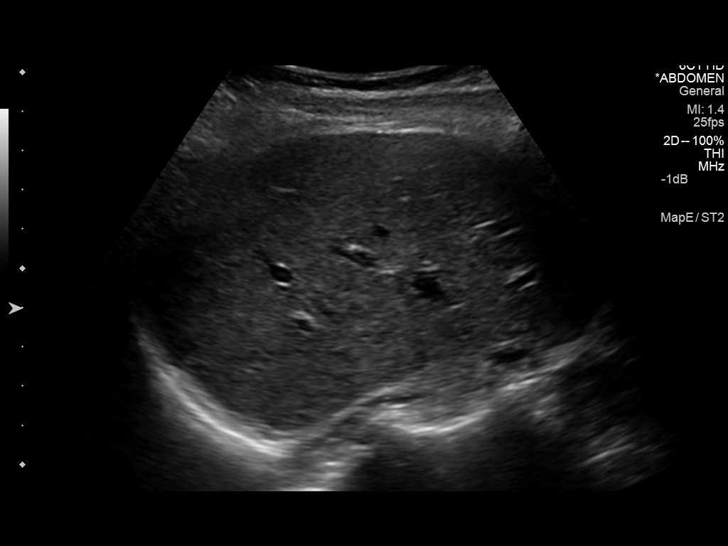
[im 17/66]
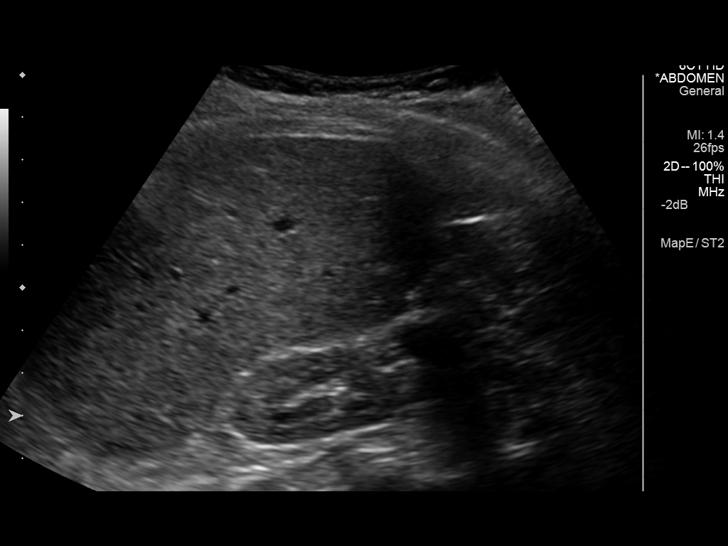
[im 22/66]
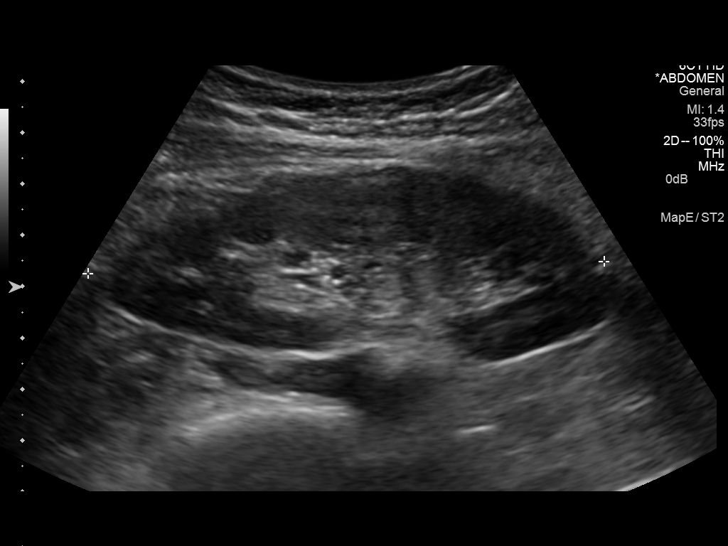
[im 25/66]
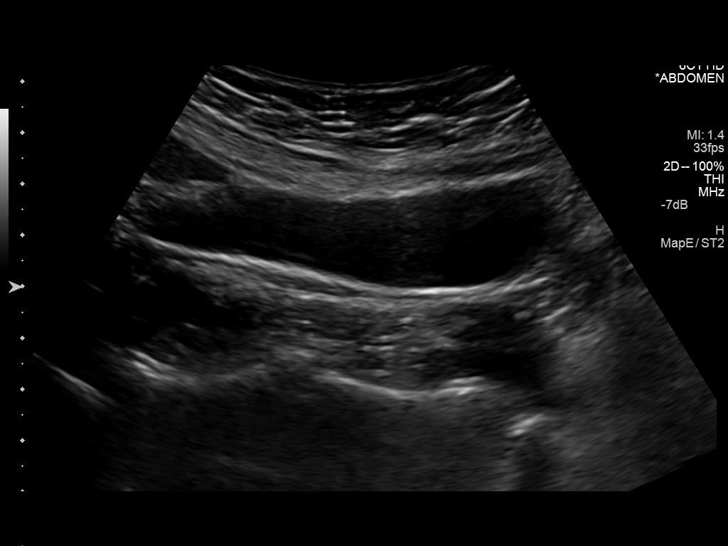
[im 30/66]
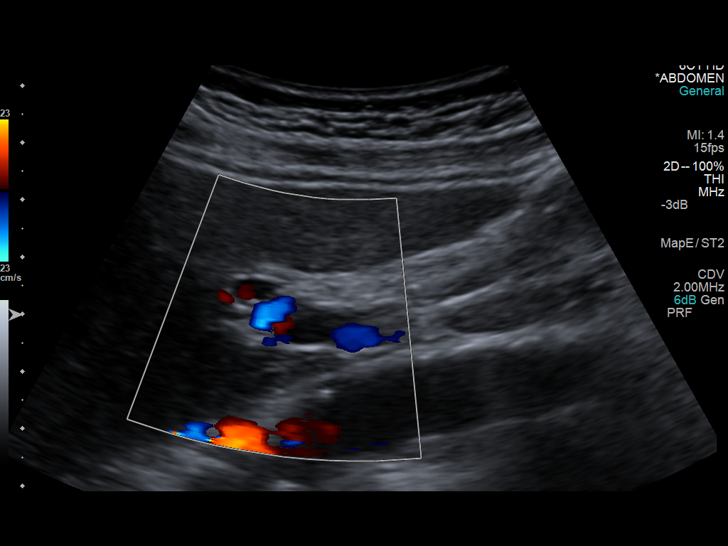
[im 36/66]
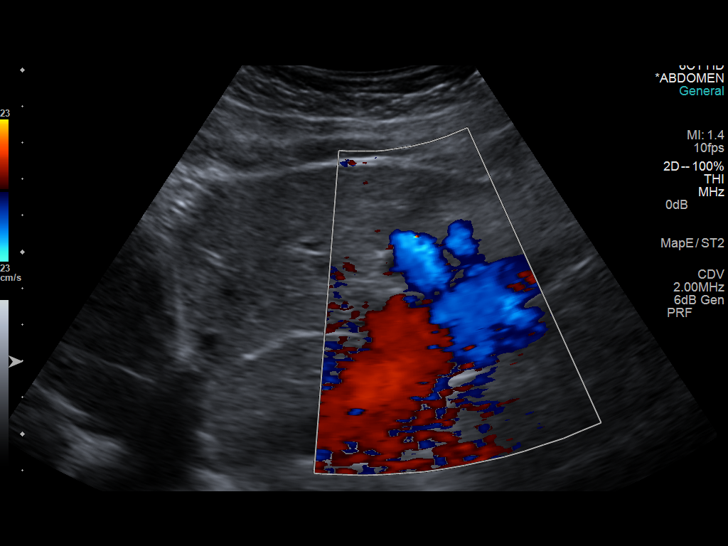
[im 41/66]
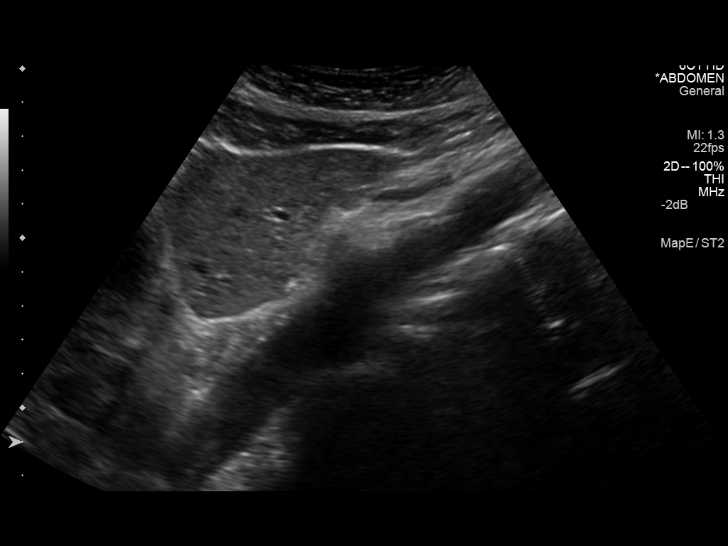
[im 44/66]
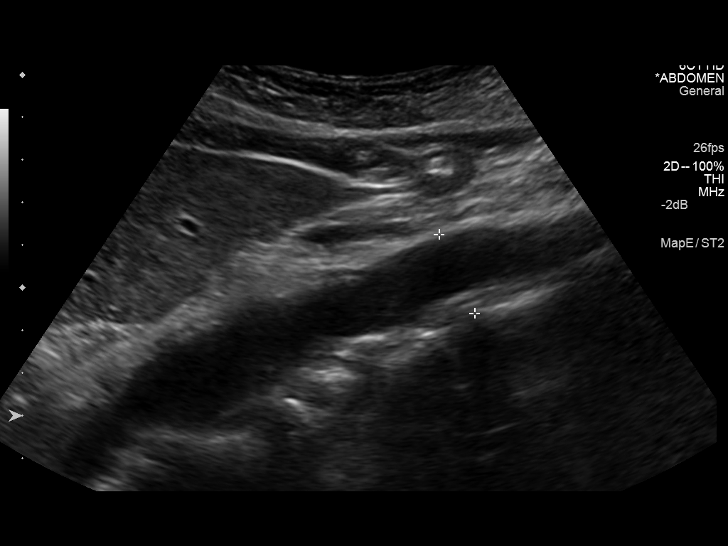
[im 49/66]
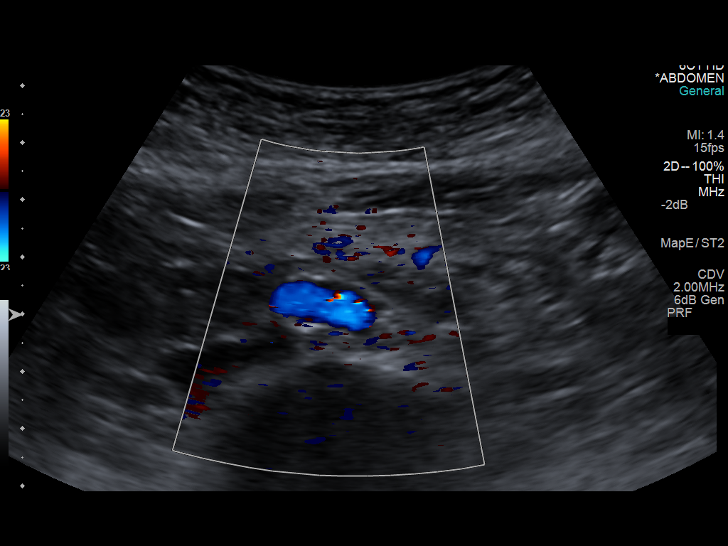
[im 55/66]
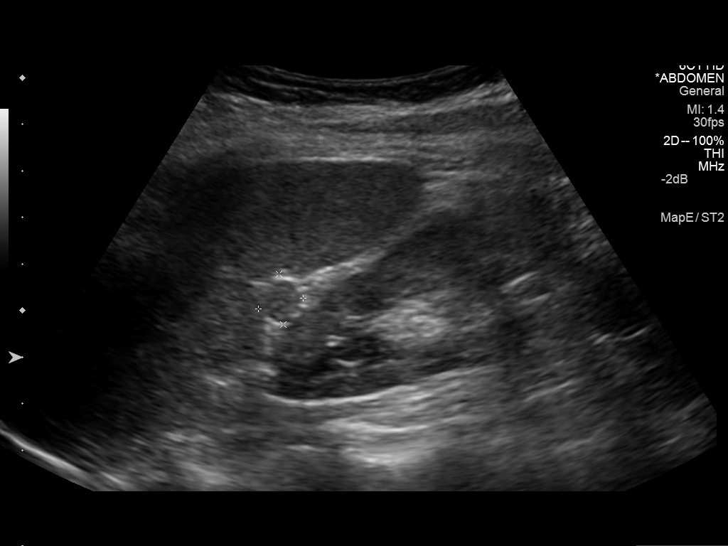
[im 60/66]
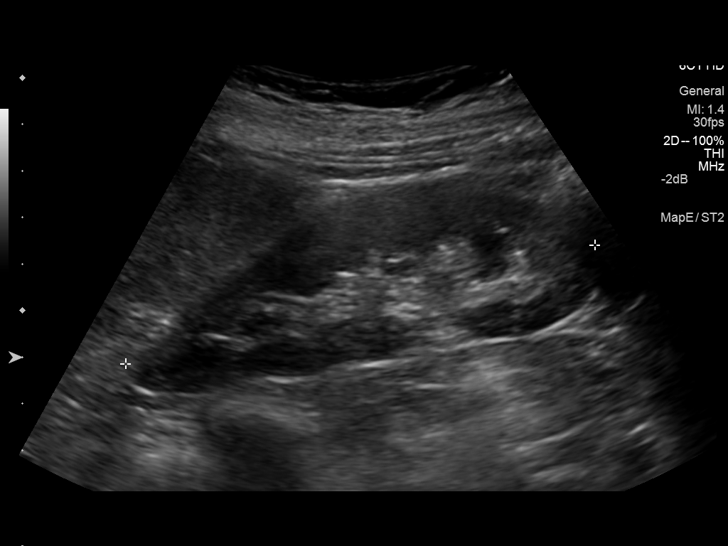
[im 66/66]
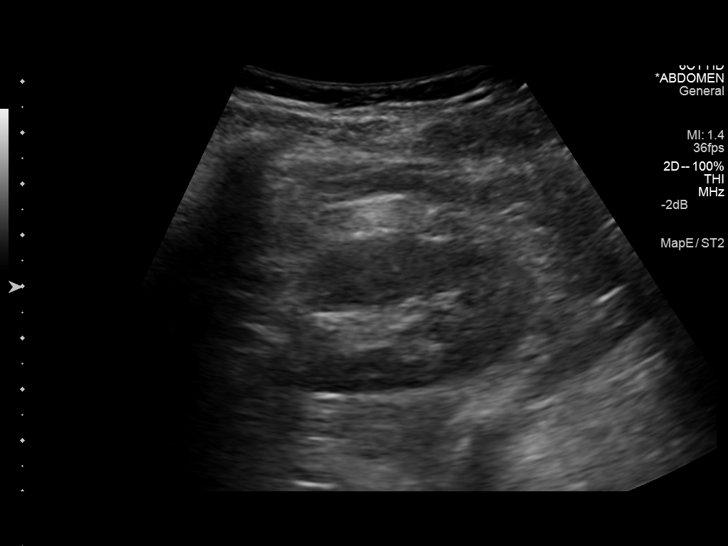

[14 of 25 positions shown; findings below may reference images not displayed]

FINDINGS: Gallbladder:

No gallstones or wall thickening visualized. There is no
pericholecystic fluid. No sonographic Murphy sign noted.

Common bile duct:

Diameter: 2 mm. There is no intrahepatic, common hepatic, or common
bile duct dilatation.

Liver:

No focal lesion identified. Several subcentimeter cyst seen on prior
CT are not seen on this study currently. Within normal limits in
parenchymal echogenicity.

IVC:

No abnormality visualized.

Pancreas:

No mass or inflammatory focus.

Spleen:

Size and appearance within normal limits. A small splenule is noted
incidentally.

Right Kidney:

Length: 10.1 cm. Echogenicity within normal limits. No mass or
hydronephrosis visualized.

Left Kidney:

Length: 10.4 cm. Echogenicity within normal limits. No mass or
hydronephrosis visualized.

Abdominal aorta:

No aneurysm visualized.

Other findings:

No demonstrable ascites.
IMPRESSION: Study within normal limits. Note that several subcentimeter liver
cysts noted on CT are not seen by ultrasound.

## 2016-06-10 ENCOUNTER — Ambulatory Visit: Payer: Medicare Other

## 2016-06-15 ENCOUNTER — Ambulatory Visit
Admission: RE | Admit: 2016-06-15 | Discharge: 2016-06-15 | Disposition: A | Discharge status: Home / Self Care | Payer: Medicare Other | Source: Ambulatory Visit | Attending: Internal Medicine | Admitting: Internal Medicine

## 2016-06-15 DIAGNOSIS — Z1231 Encounter for screening mammogram for malignant neoplasm of breast: Secondary | ICD-10-CM

## 2016-06-21 ENCOUNTER — Other Ambulatory Visit: Payer: Self-pay | Admitting: Internal Medicine

## 2016-06-21 DIAGNOSIS — R928 Other abnormal and inconclusive findings on diagnostic imaging of breast: Secondary | ICD-10-CM

## 2016-06-23 ENCOUNTER — Other Ambulatory Visit: Payer: Self-pay | Admitting: Internal Medicine

## 2016-06-23 ENCOUNTER — Ambulatory Visit
Admission: RE | Admit: 2016-06-23 | Discharge: 2016-06-23 | Disposition: A | Discharge status: Home / Self Care | Payer: Medicare Other | Source: Ambulatory Visit | Attending: Internal Medicine | Admitting: Internal Medicine

## 2016-06-23 DIAGNOSIS — R928 Other abnormal and inconclusive findings on diagnostic imaging of breast: Secondary | ICD-10-CM

## 2016-06-24 ENCOUNTER — Telehealth: Payer: Self-pay

## 2016-06-24 NOTE — Telephone Encounter (Signed)
Informed pt that there was no "[facility] Blood test" & that per provider advise she should get the breast biopsy done. If she had anymore questions about the breast biopsy she should & could call the Dr's office & they can answer any questions that she may have about the biopsy. Pt understood but still felt like there should be a blood test for cancer. She thanked me & call was over.

## 2016-06-28 ENCOUNTER — Other Ambulatory Visit: Payer: Self-pay | Admitting: *Deleted

## 2016-06-28 DIAGNOSIS — C8310 Mantle cell lymphoma, unspecified site: Secondary | ICD-10-CM

## 2016-06-28 MED ORDER — LORAZEPAM 0.5 MG PO TABS: 0.5000 mg | ORAL_TABLET | Freq: Four times a day (QID) | ORAL | 0 refills | Status: AC | PRN

## 2016-07-12 ENCOUNTER — Encounter: Payer: Self-pay | Admitting: Hematology & Oncology

## 2016-07-12 ENCOUNTER — Ambulatory Visit (HOSPITAL_BASED_OUTPATIENT_CLINIC_OR_DEPARTMENT_OTHER): Payer: Medicare Other

## 2016-07-12 ENCOUNTER — Ambulatory Visit (HOSPITAL_BASED_OUTPATIENT_CLINIC_OR_DEPARTMENT_OTHER): Payer: Medicare Other | Admitting: Hematology & Oncology

## 2016-07-12 ENCOUNTER — Ambulatory Visit: Payer: Medicare Other

## 2016-07-12 ENCOUNTER — Other Ambulatory Visit (HOSPITAL_BASED_OUTPATIENT_CLINIC_OR_DEPARTMENT_OTHER): Payer: Medicare Other

## 2016-07-12 VITALS — BP 114/66 | HR 59 | Temp 98.2°F | Resp 16

## 2016-07-12 VITALS — BP 126/68 | HR 68 | Temp 98.3°F | Resp 16 | Wt 120.5 lb

## 2016-07-12 DIAGNOSIS — C8313 Mantle cell lymphoma, intra-abdominal lymph nodes: Secondary | ICD-10-CM

## 2016-07-12 DIAGNOSIS — C8310 Mantle cell lymphoma, unspecified site: Secondary | ICD-10-CM

## 2016-07-12 DIAGNOSIS — I82401 Acute embolism and thrombosis of unspecified deep veins of right lower extremity: Secondary | ICD-10-CM

## 2016-07-12 DIAGNOSIS — Z5112 Encounter for antineoplastic immunotherapy: Secondary | ICD-10-CM

## 2016-07-12 DIAGNOSIS — C8312 Mantle cell lymphoma, intrathoracic lymph nodes: Secondary | ICD-10-CM

## 2016-07-12 DIAGNOSIS — J479 Bronchiectasis, uncomplicated: Secondary | ICD-10-CM

## 2016-07-12 DIAGNOSIS — D801 Nonfamilial hypogammaglobulinemia: Secondary | ICD-10-CM

## 2016-07-12 LAB — CBC WITH DIFFERENTIAL (CANCER CENTER ONLY)
BASO#: 0 10*3/uL (ref 0.0–0.2)
BASO%: 0.2 % (ref 0.0–2.0)
EOS%: 3 % (ref 0.0–7.0)
Eosinophils Absolute: 0.1 10*3/uL (ref 0.0–0.5)
HEMATOCRIT: 38 % (ref 34.8–46.6)
HEMOGLOBIN: 13.1 g/dL (ref 11.6–15.9)
LYMPH#: 0.6 10*3/uL — AB (ref 0.9–3.3)
LYMPH%: 12.9 % — ABNORMAL LOW (ref 14.0–48.0)
MCH: 31.1 pg (ref 26.0–34.0)
MCHC: 34.5 g/dL (ref 32.0–36.0)
MCV: 90 fL (ref 81–101)
MONO#: 0.8 10*3/uL (ref 0.1–0.9)
MONO%: 16.7 % — AB (ref 0.0–13.0)
NEUT%: 67.2 % (ref 39.6–80.0)
NEUTROS ABS: 3.1 10*3/uL (ref 1.5–6.5)
Platelets: 218 10*3/uL (ref 145–400)
RBC: 4.21 10*6/uL (ref 3.70–5.32)
RDW: 13.7 % (ref 11.1–15.7)
WBC: 4.7 10*3/uL (ref 3.9–10.0)

## 2016-07-12 LAB — CMP (CANCER CENTER ONLY)
ALBUMIN: 3.8 g/dL (ref 3.3–5.5)
ALK PHOS: 58 U/L (ref 26–84)
ALT: 24 U/L (ref 10–47)
AST: 31 U/L (ref 11–38)
BILIRUBIN TOTAL: 0.6 mg/dL (ref 0.20–1.60)
BUN, Bld: 13 mg/dL (ref 7–22)
CALCIUM: 9.4 mg/dL (ref 8.0–10.3)
CO2: 28 mEq/L (ref 18–33)
CREATININE: 0.7 mg/dL (ref 0.6–1.2)
Chloride: 100 mEq/L (ref 98–108)
GLUCOSE: 94 mg/dL (ref 73–118)
Potassium: 4 mEq/L (ref 3.3–4.7)
SODIUM: 139 meq/L (ref 128–145)
Total Protein: 6.4 g/dL (ref 6.4–8.1)

## 2016-07-12 LAB — LACTATE DEHYDROGENASE: LDH: 165 U/L (ref 125–245)

## 2016-07-12 MED ORDER — SODIUM CHLORIDE 0.9 % IV SOLN
Freq: Once | INTRAVENOUS | Status: AC
  Administered 2016-07-12: 10:00:00 via INTRAVENOUS

## 2016-07-12 MED ORDER — ACETAMINOPHEN 325 MG PO TABS
650.0000 mg | ORAL_TABLET | Freq: Once | ORAL | Status: AC
  Administered 2016-07-12: 650 mg via ORAL

## 2016-07-12 MED ORDER — ACETAMINOPHEN 325 MG PO TABS
ORAL_TABLET | ORAL | Status: AC
  Filled 2016-07-12: qty 2

## 2016-07-12 MED ORDER — SODIUM CHLORIDE 0.9 % IJ SOLN
10.0000 mL | INTRAMUSCULAR | Status: DC | PRN
  Administered 2016-07-12: 10 mL
  Filled 2016-07-12: qty 10

## 2016-07-12 MED ORDER — HEPARIN SOD (PORK) LOCK FLUSH 100 UNIT/ML IV SOLN
500.0000 [IU] | Freq: Once | INTRAVENOUS | Status: AC | PRN
  Administered 2016-07-12: 500 [IU]
  Filled 2016-07-12: qty 5

## 2016-07-12 MED ORDER — DIPHENHYDRAMINE HCL 25 MG PO CAPS
ORAL_CAPSULE | ORAL | Status: AC
  Filled 2016-07-12: qty 2

## 2016-07-12 MED ORDER — SODIUM CHLORIDE 0.9 % IV SOLN
375.0000 mg/m2 | Freq: Once | INTRAVENOUS | Status: AC
  Administered 2016-07-12: 600 mg via INTRAVENOUS
  Filled 2016-07-12: qty 50

## 2016-07-12 MED ORDER — DIPHENHYDRAMINE HCL 25 MG PO CAPS
50.0000 mg | ORAL_CAPSULE | Freq: Once | ORAL | Status: AC
  Administered 2016-07-12: 50 mg via ORAL

## 2016-07-12 MED ORDER — SODIUM CHLORIDE 0.9% FLUSH
10.0000 mL | INTRAVENOUS | Status: DC | PRN
  Administered 2016-07-12: 10 mL via INTRAVENOUS
  Filled 2016-07-12: qty 10

## 2016-07-12 NOTE — Patient Instructions (Signed)

## 2016-07-12 NOTE — Progress Notes (Signed)
Hematology and Oncology Follow Up Visit  Shannon Parsons [id number] 1941/04/16 75 y.o. 07/12/2016   Principle Diagnosis:   Mantle cell lymphoma (MCIPI = 5)  Thromboembolic event of the right leg  Acquired Hypogammaglobulinemia  Bronchiectasis  Current Therapy:    Rituxan - maintanence - s/p cycle #4 - on hold secondary to bronchiectasis  IVIG - q 2 months - on hold     Interim History:  Ms.  Clopton is back for follow-up. She seems to be doing much better with her bronchiectasis. She is not coughing nearly as much. I think the IVIG helped out quite a bit. However, she does not wish to have any more IVIG.  The big problem with her now is that there is a nodule in the left breast. On mammogram that was done a couple weeks ago, she had 2 indeterminant 4 mm nodules in the lower left breast. It is recommended that a biopsy be done. However, she has been a delay on this. I told her that she really needs to have the biopsy done. Husband agrees with me. He will make sure that this is done.  She is very worried about her mantle cell lymphoma. I realize that this certainly could recur. I told her that there is no evidence that doing routine PET scans will help. I told her that if she has recurrence, that she probably will know it herself.  She is eating pretty well. She's had no nausea or vomiting. She's had no change in bowel or bladder habits. There's been no leg swelling. She's had no fever. She's had no sweats. There's been no rashes.  She's had no leg swelling. She's had no joint problems.  Overall, her performance status is ECOG 1.  Medications:  Current Outpatient Prescriptions:  .  acetaminophen (TYLENOL) 325 MG tablet, Take 325 mg by mouth every 6 (six) hours as needed (For pain.). , Disp: , Rfl:  .  ALPRAZolam (XANAX) 0.5 MG tablet, Take 0.5 mg by mouth at bedtime as needed for anxiety., Disp: , Rfl:  .  Cholecalciferol (VITAMIN D) 2000 UNITS CAPS, Take 2,000-4,000 capsules  by mouth every other day. Patient alternates 1 cap with 2 caps every other day., Disp: , Rfl:  .  levothyroxine (SYNTHROID, LEVOTHROID) 50 MCG tablet, TAKE 1 TABLET BY MOUTH ONCE DAILY., Disp: 90 tablet, Rfl: 3 .  lidocaine-prilocaine (EMLA) cream, Apply 1 application topically as needed. Apply to [facility] one hour before access., Disp: 30 g, Rfl: 3 .  Probiotic Product (PROBIOTIC DAILY PO), Take 1 capsule by mouth daily., Disp: , Rfl:  .  albuterol (PROVENTIL HFA;VENTOLIN HFA) 108 (90 BASE) MCG/ACT inhaler, Inhale 2 puffs into the lungs every 6 (six) hours as needed for wheezing or shortness of breath (cough). (Patient not taking: Reported on 07/12/2016), Disp: 1 Inhaler, Rfl: 0 .  albuterol (PROVENTIL) (2.5 MG/3ML) 0.083% nebulizer solution, Take 3 mLs (2.5 mg total) by nebulization 4 (four) times daily. (Patient not taking: Reported on 07/12/2016), Disp: 360 mL, Rfl: 11 .  hyoscyamine (LEVSIN, ANASPAZ) 0.125 MG tablet, Take 1 to 2 tablets every 4 hours as needed for nausea, cramping, bloating or diarrhea (Patient not taking: Reported on 07/12/2016), Disp: 90 tablet, Rfl: 11 .  ipratropium (ATROVENT) 0.02 % nebulizer solution, Take 2.5 mLs (0.5 mg total) by nebulization 4 (four) times daily. (Patient not taking: Reported on 07/12/2016), Disp: 300 mL, Rfl: 11 .  ipratropium-albuterol (DUONEB) 0.5-2.5 (3) MG/3ML SOLN, Take 3 mLs by nebulization every 6 (six) hours  as needed. (Patient not taking: Reported on 07/12/2016), Disp: 360 mL, Rfl: 0 .  LORazepam (ATIVAN) 0.5 MG tablet, Take 1 tablet (0.5 mg total) by mouth every 6 (six) hours as needed (Nausea or vomiting). (Patient not taking: Reported on 07/12/2016), Disp: 30 tablet, Rfl: 0 .  promethazine-dextromethorphan (PROMETHAZINE-DM) 6.25-15 MG/5ML syrup, Take 5-10 mL PO q8hrs prn for cold symptoms (Patient not taking: Reported on 07/12/2016), Disp: 360 mL, Rfl: 1 .  Respiratory Therapy Supplies (FLUTTER) DEVI, Take as directed (Patient not taking: Reported on  07/12/2016), Disp: 1 each, Rfl: 0 .  vitamin B-12 (CYANOCOBALAMIN) 1000 MCG tablet, Take 1,000 mcg by mouth daily., Disp: , Rfl:  No current facility-administered medications for this visit.   Facility-Administered Medications Ordered in Other Visits:  .  sodium chloride flush (NS) 0.9 % injection 10 mL, 10 mL, Intravenous, PRN, Volanda Napoleon, MD, 10 mL at 07/12/16 0853  Allergies:  Allergies  Allergen Reactions  . Actonel [Risedronate Sodium] Other (See Comments)    "made me choke"   . Fosamax [Alendronate] Other (See Comments)    "made me choke"  . Sulfa Antibiotics Other (See Comments)    Doesn't remember   . Tetracyclines & Related Rash    Past Medical History, Surgical history, Social history, and Family History were reviewed and updated.  Review of Systems: As above  Physical Exam:  weight is 120 lb 8 oz (54.7 kg). Her oral temperature is 98.3 F (36.8 C). Her blood pressure is 126/68 and her pulse is 68. Her respiration is 16.   Thin but fairly well-nourished white female. Head and neck exam shows no ocular or oral lesions. She has no bilateral cervical or supraclavicular lymph nodes. She has no palpable thyroid. Lungs are clear. Cardiac exam regular rate and rhythm with no murmurs, rubs or bruits. Axillary exam shows no obvious bilateral axillary adenopathy. Abdomen is soft. She has decent bowel sounds. There is no fluid wave. Her spleen tip is just below the left costal margin. There is no hepatomegaly. Back exam shows no tenderness over the spine, ribs or hips. Extremities shows no clubbing, cyanosis or edema. No palpable venous cord is noted in the right leg. She has good range of motion of her joints. She has good strength in her muscles. Neurological exam is nonfocal. Skin exam shows no rashes, ecchymoses or petechia  Lab Results  Component Value Date   WBC 4.7 07/12/2016   HGB 13.1 07/12/2016   HCT 38.0 07/12/2016   MCV 90 07/12/2016   PLT 218 07/12/2016      Chemistry      Component Value Date/Time   NA 139 07/12/2016 0814   NA 138 08/01/2015 1124   K 4.0 07/12/2016 0814   K 4.0 08/01/2015 1124   CL 100 07/12/2016 0814   CO2 28 07/12/2016 0814   CO2 28 08/01/2015 1124   BUN 13 07/12/2016 0814   BUN 10.4 08/01/2015 1124   CREATININE 0.7 07/12/2016 0814   CREATININE 0.8 08/01/2015 1124      Component Value Date/Time   CALCIUM 9.4 07/12/2016 0814   CALCIUM 9.6 08/01/2015 1124   ALKPHOS 58 07/12/2016 0814   ALKPHOS 88 08/01/2015 1124   AST 31 07/12/2016 0814   AST 23 08/01/2015 1124   ALT 24 07/12/2016 0814   ALT 12 08/01/2015 1124   BILITOT 0.60 07/12/2016 0814   BILITOT <0.30 08/01/2015 1124         Impression and Plan: Ms. Bord is 75 year old white  female. She has mantle cell lymphoma. Again, by the [facility] she is an intermediate risk.  She has responded very well. Again, we have to watch out for recurrence. She is at risk for recurrence.  I will go ahead and give her Rituxan today. I'll Hold on her IVIG. She does not want any IVIG right now.  I will plan to get her back in 2 more months. For right now, I will just give her the Rituxan.     Volanda Napoleon, MD 10/30/201710:09 AM

## 2016-07-12 NOTE — Patient Instructions (Signed)
[facility] Discharge Instructions for Patients Receiving Chemotherapy  Today you received the following chemotherapy agents Rituxan  To help prevent nausea and vomiting after your treatment, we encourage you to take your nausea medication    If you develop nausea and vomiting that is not controlled by your nausea medication, call the clinic.   BELOW ARE SYMPTOMS THAT SHOULD BE REPORTED IMMEDIATELY:  *FEVER GREATER THAN 100.5 F  *CHILLS WITH OR WITHOUT FEVER  NAUSEA AND VOMITING THAT IS NOT CONTROLLED WITH YOUR NAUSEA MEDICATION  *UNUSUAL SHORTNESS OF BREATH  *UNUSUAL BRUISING OR BLEEDING  TENDERNESS IN MOUTH AND THROAT WITH OR WITHOUT PRESENCE OF ULCERS  *URINARY PROBLEMS  *BOWEL PROBLEMS  UNUSUAL RASH Items with * indicate a potential emergency and should be followed up as soon as possible.  Feel free to call the clinic you have any questions or concerns. The clinic phone number is [phone].  Please show the CHEMO ALERT CARD at check-in to the Emergency Department and triage nurse.   

## 2016-07-13 ENCOUNTER — Other Ambulatory Visit: Payer: Self-pay | Admitting: Internal Medicine

## 2016-07-13 LAB — IGG, IGA, IGM
IGA/IMMUNOGLOBULIN A, SERUM: 29 mg/dL — AB (ref 64–422)
IGM (IMMUNOGLOBIN M), SRM: 6 mg/dL — AB (ref 26–217)
IgG, Qn, Serum: 737 mg/dL (ref 700–1600)

## 2016-07-13 MED ORDER — CALCITONIN (SALMON) 200 UNIT/ACT NA SOLN: NASAL | 3 refills | Status: DC

## 2016-07-13 NOTE — Progress Notes (Signed)
 -   dexa BMD from [facility] showed T score -3.0 in Lumbar Spine  - Intolerant to bisphosphates, so   - Rx Miacalcin daily

## 2016-07-15 ENCOUNTER — Encounter: Payer: Self-pay | Admitting: Hematology & Oncology

## 2016-07-16 ENCOUNTER — Encounter: Payer: Self-pay | Admitting: Gastroenterology

## 2016-07-23 ENCOUNTER — Other Ambulatory Visit: Payer: Self-pay | Admitting: Internal Medicine

## 2016-07-23 ENCOUNTER — Inpatient Hospital Stay: Admission: RE | Admit: 2016-07-23 | Payer: Medicare Other | Source: Ambulatory Visit

## 2016-07-23 DIAGNOSIS — R928 Other abnormal and inconclusive findings on diagnostic imaging of breast: Secondary | ICD-10-CM

## 2016-07-29 ENCOUNTER — Ambulatory Visit
Admission: RE | Admit: 2016-07-29 | Discharge: 2016-07-29 | Disposition: A | Discharge status: Home / Self Care | Payer: Medicare Other | Source: Ambulatory Visit | Attending: Internal Medicine | Admitting: Internal Medicine

## 2016-07-29 ENCOUNTER — Other Ambulatory Visit: Payer: Self-pay | Admitting: Internal Medicine

## 2016-07-29 DIAGNOSIS — R928 Other abnormal and inconclusive findings on diagnostic imaging of breast: Secondary | ICD-10-CM

## 2016-08-23 ENCOUNTER — Encounter: Payer: Self-pay | Admitting: Internal Medicine

## 2016-08-23 ENCOUNTER — Ambulatory Visit (INDEPENDENT_AMBULATORY_CARE_PROVIDER_SITE_OTHER): Payer: Medicare Other | Admitting: Internal Medicine

## 2016-08-23 VITALS — BP 106/68 | HR 64 | Temp 97.7°F | Resp 16 | Ht 63.5 in | Wt 120.6 lb

## 2016-08-23 DIAGNOSIS — E559 Vitamin D deficiency, unspecified: Secondary | ICD-10-CM

## 2016-08-23 DIAGNOSIS — Z136 Encounter for screening for cardiovascular disorders: Secondary | ICD-10-CM | POA: Diagnosis not present

## 2016-08-23 DIAGNOSIS — I10 Essential (primary) hypertension: Secondary | ICD-10-CM

## 2016-08-23 DIAGNOSIS — K2100 Gastro-esophageal reflux disease with esophagitis, without bleeding: Secondary | ICD-10-CM

## 2016-08-23 DIAGNOSIS — G250 Essential tremor: Secondary | ICD-10-CM

## 2016-08-23 DIAGNOSIS — R7303 Prediabetes: Secondary | ICD-10-CM | POA: Diagnosis not present

## 2016-08-23 DIAGNOSIS — G4733 Obstructive sleep apnea (adult) (pediatric): Secondary | ICD-10-CM

## 2016-08-23 DIAGNOSIS — R6889 Other general symptoms and signs: Secondary | ICD-10-CM

## 2016-08-23 DIAGNOSIS — Z0001 Encounter for general adult medical examination with abnormal findings: Secondary | ICD-10-CM

## 2016-08-23 DIAGNOSIS — E782 Mixed hyperlipidemia: Secondary | ICD-10-CM

## 2016-08-23 DIAGNOSIS — Z79899 Other long term (current) drug therapy: Secondary | ICD-10-CM | POA: Diagnosis not present

## 2016-08-23 DIAGNOSIS — R0989 Other specified symptoms and signs involving the circulatory and respiratory systems: Secondary | ICD-10-CM

## 2016-08-23 LAB — TSH: TSH: 2.08 m[IU]/L

## 2016-08-23 NOTE — Patient Instructions (Signed)

## 2016-08-23 NOTE — Progress Notes (Signed)
[facility] Unk Pinto, M.D.    Shannon Parsons. Shannon Parsons, P.A.-C      Starlyn Skeans, P.A.-C  [facility]                [address] [place] [id number] Telephone [phone] Telefax [phone]  Annual Screening/Preventative Visit & Comprehensive Evaluation &  Examination     This very nice  MWF presents for a Screening/Preventative Visit & comprehensive evaluation and management of multiple medical co-morbidities.  Patient has been followed for labile HTN, Prediabetes, Hyperlipidemia and Vitamin D Deficiency. Other problems include Familial Tremor. Patient has Osteoporosis and alleges intolerance to bisphosphonates and recently rx'd Calcitonin was refuse because of something that she read on the Internet.       Patient was dx'd Jan 2016 with Mantle Cell Lymphoma and completed induction  Chemotx by Dr Marin Olp and PET scan/Bone marrow in Sept showed remission and patient has been started on maintenance chemotx with Rituxan q65mo.  In April 2016, she was dx'd with DVT of the R leg & was treated for 6 months w/ Xarelto. Patient continues close f/u with Dr Marin Olp.          Patient has hx/o labile HTN being monitored expectantly. Patient's BP has been controlled at home and patient denies any cardiac symptoms as chest pain, palpitations, shortness of breath, dizziness or ankle swelling. Today's BP is at goal - 106/68.      Patient's hyperlipidemia is not controlled with diet. Patient denies myalgias or other medication SE's. Last lipids were not at goal with patient's reluctance/resilience to not take meds for her lipids: Lab Results  Component Value Date   CHOL 188 10/23/2015   HDL 37 (L) 10/23/2015   LDLCALC 125 10/23/2015   TRIG 132 10/23/2015   CHOLHDL 5.1 (H) 10/23/2015      Patient has prediabetes with A1c 5.9% circa 2012 and patient denies reactive hypoglycemic symptoms, visual blurring,  diabetic polys, or paresthesias. Last A1c was not at goal: Lab Results  Component Value Date   HGBA1C 5.8 (H) 10/23/2015      Finally, patient has history of Vitamin D Deficiency and last Vitamin D was near goal (70-100)  Lab Results  Component Value Date   VD25OH 56.4 11/24/2015   Current Outpatient Prescriptions on File Prior to Visit  Medication Sig  . acetaminophen  325 MG tablet Take  every 6  hrs as needed (For pain.).   [name] ALPRAZolam 0.5 MG tablet Take at bedtime as needed for sleep  . VITAMIN D 2000 UNITS   alternates 1 cap with 2 caps every other day.  . levothyroxine  50 MCG tablet TAKE 1 TAB ONCE DAILY.  . Probiotic Product  Take 1 cap daily.  . vitamin B-12  1000 MCG tablet Take daily.   Allergies  Allergen Reactions  . Actonel [Risedronate Sodium] Other (See Comments)    "made me choke"   . Fosamax [Alendronate] Other (See Comments)    "made me choke"  . Sulfa Antibiotics Other (See Comments)    Doesn't remember   . Tetracyclines & Related Rash   Past Medical History:  Diagnosis Date  . Adenomatous colon polyp 1994  . Asthma   . Diverticulosis   . Family history of ischemic heart disease   . Fibrocystic breast disease   . GERD (gastroesophageal reflux disease)   .  Hiatal hernia   . Hyperlipidemia   . Hypertension   . IBS (irritable bowel syndrome)   . Internal hemorrhoids   . Mantle cell lymphoma ([place]) 10/18/2014  . Melanoma ([place])    Facial  . OSA (obstructive sleep apnea)   . Osteoarthritis   . Osteoporosis   . Prediabetes   . Unspecified hypothyroidism   . Vitamin D deficiency    Health Maintenance  Topic Date Due  . TETANUS/TDAP  08/17/1962  . INFLUENZA VACCINE  07/12/2017 (Originally 04/13/2016)  . MAMMOGRAM  06/15/2018  . COLONOSCOPY  08/18/2021  . DEXA SCAN  Completed  . ZOSTAVAX  Completed  . PNA vac Low Risk Adult  Completed   Immunization History  Administered Date(s) Administered  . Pneumococcal Conjugate-13 03/18/2016  . Pneumococcal  Polysaccharide-23 06/18/2013  . Zoster 04/17/2013   Past Surgical History:  Procedure Laterality Date  . APPENDECTOMY    . BUNIONECTOMY    . KNEE ARTHROSCOPY    . TONSILLECTOMY    . UMBILICAL HERNIA REPAIR     Family History  Problem Relation Age of Onset  . Emphysema Father   . Heart disease Father   . Heart attack Father 43    Died  . Heart disease Brother   . Rheum arthritis Sister   . Hypertension Mother   . Thyroid disease Mother   . Colon cancer Neg Hx    Social History  Substance Use Topics  . Smoking status: Never Smoker  . Smokeless tobacco: Never Used     Comment: Never Used Tobacco  . Alcohol use No    ROS Constitutional: Denies fever, chills, weight loss/gain, headaches, insomnia,  night sweats, and change in appetite. Does c/o fatigue. Eyes: Denies redness, blurred vision, diplopia, discharge, itchy, watery eyes.  ENT: Denies discharge, congestion, post nasal drip, epistaxis, sore throat, earache, hearing loss, dental pain, Tinnitus, Vertigo, Sinus pain, snoring.  Cardio: Denies chest pain, palpitations, irregular heartbeat, syncope, dyspnea, diaphoresis, orthopnea, PND, claudication, edema Respiratory: denies cough, dyspnea, DOE, pleurisy, hoarseness, laryngitis, wheezing.  Gastrointestinal: Denies dysphagia, heartburn, reflux, water brash, pain, cramps, nausea, vomiting, bloating, diarrhea, constipation, hematemesis, melena, hematochezia, jaundice, hemorrhoids Genitourinary: Denies dysuria, frequency, urgency, nocturia, hesitancy, discharge, hematuria, flank pain Breast: Breast lumps, nipple discharge, bleeding.  Musculoskeletal: Denies arthralgia, myalgia, stiffness, Jt. Swelling, pain, limp, and strain/sprain. Denies falls. Skin: Denies puritis, rash, hives, warts, acne, eczema, changing in skin lesion Neuro: No weakness, tremor, incoordination, spasms, paresthesia, pain Psychiatric: Denies confusion, memory loss, sensory loss. Denies Depression. Endocrine:  Denies change in weight, skin, hair change, nocturia, and paresthesia, diabetic polys, visual blurring, hyper / hypo glycemic episodes.  Heme/Lymph: No excessive bleeding, bruising, enlarged lymph nodes.  Physical Exam  BP 106/68   Pulse 64   Temp 97.7 F (36.5 C)   Resp 16   Ht 5' 3.5" (1.613 m)   Wt 120 lb 9.6 oz (54.7 kg)   LMP 09/14/2014   BMI 21.03 kg/m   General Appearance: Well nourished and in no apparent distress.  Eyes: PERRLA, EOMs, conjunctiva no swelling or erythema, normal fundi and vessels. Sinuses: No frontal/maxillary tenderness ENT/Mouth: EACs patent / TMs  nl. Nares clear without erythema, swelling, mucoid exudates. Oral hygiene is good. No erythema, swelling, or exudate. Tongue normal, non-obstructing. Tonsils not swollen or erythematous. Hearing normal.  Neck: Supple, thyroid normal. No bruits, nodes or JVD. Respiratory: Respiratory effort normal.  BS equal and clear bilateral without rales, rhonci, wheezing or stridor. Cardio: Heart sounds are normal with regular  rate and rhythm and no murmurs, rubs or gallops. Peripheral pulses are normal and equal bilaterally without edema. No aortic or femoral bruits. Chest: symmetric with normal excursions and percussion. Breasts: Symmetric, without lumps, nipple discharge, retractions, or fibrocystic changes.  Abdomen: Flat, soft with bowel sounds active. Nontender, no guarding, rebound, hernias, masses, or organomegaly.  Lymphatics: Non tender without lymphadenopathy.  Genitourinary:  Musculoskeletal: Full ROM all peripheral extremities, joint stability, 5/5 strength, and normal gait. Skin: Warm and dry without rashes, lesions, cyanosis, clubbing or  ecchymosis.  Neuro: Cranial nerves intact, reflexes equal bilaterally. Titubation of the head. Normal muscle tone, no cerebellar symptoms. Sensation intact.  Pysch: Alert and oriented X 3, normal affect with questionable Insight and Judgment.   Assessment and Plan  1.  Annual Preventative Screening Examination   2. Labile hypertension  - Microalbumin / creatinine urine ratio - EKG 12-Lead - Urinalysis, Routine w reflex microscopic - CBC with Differential/Platelet - BASIC METABOLIC PANEL WITH GFR - TSH  3. Mixed hyperlipidemia  - EKG 12-Lead - Hepatic function panel - Lipid panel - TSH  4. Prediabetes  - EKG 12-Lead - Hemoglobin A1c - Insulin, random  5. Vitamin D deficiency  - VITAMIN D 25 Hydroxy   6. GERD   7. Screening for ischemic heart disease  - EKG 12-Lead  8. Essential tremor   9. OSA (obstructive sleep apnea)   10. Medication management  - Urinalysis, Routine w reflex microscopic - CBC with Differential/Platelet - BASIC METABOLIC PANEL WITH GFR - Hepatic function panel - Magnesium      Continue prudent diet as discussed, weight control, BP monitoring, regular exercise, and medications. Discussed med's effects and SE's. Screening labs and tests as requested with regular follow-up as recommended. Over 40 minutes of exam, counseling, chart review and high complex critical decision making was performed.

## 2016-08-24 ENCOUNTER — Other Ambulatory Visit: Payer: Self-pay | Admitting: *Deleted

## 2016-08-24 ENCOUNTER — Other Ambulatory Visit: Payer: Self-pay | Admitting: Internal Medicine

## 2016-08-24 DIAGNOSIS — C8313 Mantle cell lymphoma, intra-abdominal lymph nodes: Secondary | ICD-10-CM

## 2016-08-24 LAB — BASIC METABOLIC PANEL WITH GFR
BUN: 15 mg/dL (ref 7–25)
CHLORIDE: 99 mmol/L (ref 98–110)
CO2: 26 mmol/L (ref 20–31)
CREATININE: 0.88 mg/dL (ref 0.60–0.93)
Calcium: 9.5 mg/dL (ref 8.6–10.4)
GFR, Est African American: 75 mL/min (ref >=60)
GFR, Est Non African American: 65 mL/min (ref >=60)
GLUCOSE: 80 mg/dL (ref 65–99)
Potassium: 4.1 mmol/L (ref 3.5–5.3)
Sodium: 135 mmol/L (ref 135–146)

## 2016-08-24 LAB — URINALYSIS, ROUTINE W REFLEX MICROSCOPIC
Bilirubin Urine: NEGATIVE
GLUCOSE, UA: NEGATIVE
LEUKOCYTES UA: NEGATIVE
Nitrite: NEGATIVE
PH: 6.5 (ref 5.0–8.0)
Protein, ur: NEGATIVE
Specific Gravity, Urine: 1.006 (ref 1.001–1.035)

## 2016-08-24 LAB — MICROALBUMIN / CREATININE URINE RATIO
CREATININE, URINE: 35 mg/dL (ref 20–320)
MICROALB UR: 0.7 mg/dL
Microalb Creat Ratio: 20 mcg/mg creat (ref <30)

## 2016-08-24 LAB — MAGNESIUM: MAGNESIUM: 1.8 mg/dL (ref 1.5–2.5)

## 2016-08-24 LAB — CBC WITH DIFFERENTIAL/PLATELET
BASOS ABS: 0 {cells}/uL (ref 0–200)
Basophils Relative: 0 %
EOS ABS: 128 {cells}/uL (ref 15–500)
Eosinophils Relative: 2 %
HEMATOCRIT: 40.4 % (ref 35.0–45.0)
Hemoglobin: 13.2 g/dL (ref 11.7–15.5)
Lymphocytes Relative: 12 %
Lymphs Abs: 768 cells/uL — ABNORMAL LOW (ref 850–3900)
MCH: 29.9 pg (ref 27.0–33.0)
MCHC: 32.7 g/dL (ref 32.0–36.0)
MCV: 91.6 fL (ref 80.0–100.0)
MONOS PCT: 11 %
MPV: 9.6 fL (ref 7.5–12.5)
Monocytes Absolute: 704 cells/uL (ref 200–950)
NEUTROS ABS: 4800 {cells}/uL (ref 1500–7800)
Neutrophils Relative %: 75 %
PLATELETS: 223 10*3/uL (ref 140–400)
RBC: 4.41 MIL/uL (ref 3.80–5.10)
RDW: 13.6 % (ref 11.0–15.0)
WBC: 6.4 10*3/uL (ref 3.8–10.8)

## 2016-08-24 LAB — HEPATIC FUNCTION PANEL
ALBUMIN: 4.3 g/dL (ref 3.6–5.1)
ALT: 15 U/L (ref 6–29)
AST: 27 U/L (ref 10–35)
Alkaline Phosphatase: 60 U/L (ref 33–130)
Bilirubin, Direct: 0.1 mg/dL (ref <=0.2)
Indirect Bilirubin: 0.3 mg/dL (ref 0.2–1.2)
TOTAL PROTEIN: 6.5 g/dL (ref 6.1–8.1)
Total Bilirubin: 0.4 mg/dL (ref 0.2–1.2)

## 2016-08-24 LAB — INSULIN, RANDOM: INSULIN: 3.6 u[IU]/mL (ref 2.0–19.6)

## 2016-08-24 LAB — HEMOGLOBIN A1C
HEMOGLOBIN A1C: 5.4 % (ref <5.7)
Mean Plasma Glucose: 108 mg/dL

## 2016-08-24 LAB — LIPID PANEL
Cholesterol: 232 mg/dL — ABNORMAL HIGH (ref <200)
HDL: 65 mg/dL (ref >50)
LDL CALC: 143 mg/dL — AB (ref <100)
Total CHOL/HDL Ratio: 3.6 Ratio (ref <5.0)
Triglycerides: 118 mg/dL (ref <150)
VLDL: 24 mg/dL (ref <30)

## 2016-08-24 LAB — URINALYSIS, MICROSCOPIC ONLY
BACTERIA UA: NONE SEEN [HPF]
Casts: NONE SEEN [LPF]
Crystals: NONE SEEN [HPF]
RBC / HPF: NONE SEEN RBC/HPF (ref <=2)
Squamous Epithelial / LPF: NONE SEEN [HPF] (ref <=5)
WBC UA: NONE SEEN WBC/HPF (ref <=5)
YEAST: NONE SEEN [HPF]

## 2016-08-24 LAB — PATHOLOGIST SMEAR REVIEW

## 2016-08-24 LAB — VITAMIN D 25 HYDROXY (VIT D DEFICIENCY, FRACTURES): VIT D 25 HYDROXY: 63 ng/mL (ref 30–100)

## 2016-08-25 ENCOUNTER — Encounter: Payer: Self-pay | Admitting: *Deleted

## 2016-09-10 ENCOUNTER — Ambulatory Visit: Payer: Medicare Other

## 2016-09-10 ENCOUNTER — Ambulatory Visit (HOSPITAL_BASED_OUTPATIENT_CLINIC_OR_DEPARTMENT_OTHER): Payer: Medicare Other | Admitting: Hematology & Oncology

## 2016-09-10 ENCOUNTER — Other Ambulatory Visit (HOSPITAL_BASED_OUTPATIENT_CLINIC_OR_DEPARTMENT_OTHER): Payer: Medicare Other

## 2016-09-10 ENCOUNTER — Ambulatory Visit (HOSPITAL_BASED_OUTPATIENT_CLINIC_OR_DEPARTMENT_OTHER): Payer: Medicare Other

## 2016-09-10 VITALS — BP 107/48 | HR 66 | Temp 98.1°F | Resp 20

## 2016-09-10 VITALS — BP 106/58 | HR 63 | Temp 98.4°F | Resp 20

## 2016-09-10 DIAGNOSIS — C8313 Mantle cell lymphoma, intra-abdominal lymph nodes: Secondary | ICD-10-CM

## 2016-09-10 DIAGNOSIS — C8310 Mantle cell lymphoma, unspecified site: Secondary | ICD-10-CM

## 2016-09-10 DIAGNOSIS — D801 Nonfamilial hypogammaglobulinemia: Secondary | ICD-10-CM

## 2016-09-10 DIAGNOSIS — Z5112 Encounter for antineoplastic immunotherapy: Secondary | ICD-10-CM | POA: Diagnosis not present

## 2016-09-10 LAB — CBC WITH DIFFERENTIAL (CANCER CENTER ONLY)
BASO#: 0.1 10*3/uL (ref 0.0–0.2)
BASO%: 2 % (ref 0.0–2.0)
EOS ABS: 0.2 10*3/uL (ref 0.0–0.5)
EOS%: 2.9 % (ref 0.0–7.0)
HCT: 36.9 % (ref 34.8–46.6)
HGB: 12.4 g/dL (ref 11.6–15.9)
LYMPH#: 0.6 10*3/uL — ABNORMAL LOW (ref 0.9–3.3)
LYMPH%: 10.1 % — AB (ref 14.0–48.0)
MCH: 30.7 pg (ref 26.0–34.0)
MCHC: 33.6 g/dL (ref 32.0–36.0)
MCV: 91 fL (ref 81–101)
MONO#: 0.6 10*3/uL (ref 0.1–0.9)
MONO%: 10.9 % (ref 0.0–13.0)
NEUT#: 4 10*3/uL (ref 1.5–6.5)
NEUT%: 74.1 % (ref 39.6–80.0)
PLATELETS: 191 10*3/uL (ref 145–400)
RBC: 4.04 10*6/uL (ref 3.70–5.32)
RDW: 13.2 % (ref 11.1–15.7)
WBC: 5.4 10*3/uL (ref 3.9–10.0)

## 2016-09-10 LAB — CMP (CANCER CENTER ONLY)
ALT(SGPT): 20 U/L (ref 10–47)
AST: 33 U/L (ref 11–38)
Albumin: 3.7 g/dL (ref 3.3–5.5)
Alkaline Phosphatase: 62 U/L (ref 26–84)
BUN: 10 mg/dL (ref 7–22)
CHLORIDE: 104 meq/L (ref 98–108)
CO2: 27 mEq/L (ref 18–33)
CREATININE: 1 mg/dL (ref 0.6–1.2)
Calcium: 9.3 mg/dL (ref 8.0–10.3)
GLUCOSE: 91 mg/dL (ref 73–118)
Potassium: 3.6 mEq/L (ref 3.3–4.7)
SODIUM: 141 meq/L (ref 128–145)
TOTAL PROTEIN: 5.9 g/dL — AB (ref 6.4–8.1)
Total Bilirubin: 0.7 mg/dl (ref 0.20–1.60)

## 2016-09-10 LAB — TECHNOLOGIST REVIEW CHCC SATELLITE: Tech Review: 3

## 2016-09-10 LAB — LACTATE DEHYDROGENASE: LDH: 208 U/L (ref 125–245)

## 2016-09-10 MED ORDER — SODIUM CHLORIDE 0.9 % IV SOLN
Freq: Once | INTRAVENOUS | Status: AC
  Administered 2016-09-10: 09:00:00 via INTRAVENOUS

## 2016-09-10 MED ORDER — DIPHENHYDRAMINE HCL 25 MG PO CAPS
50.0000 mg | ORAL_CAPSULE | Freq: Once | ORAL | Status: AC
  Administered 2016-09-10: 50 mg via ORAL

## 2016-09-10 MED ORDER — SODIUM CHLORIDE 0.9 % IV SOLN
375.0000 mg/m2 | Freq: Once | INTRAVENOUS | Status: AC
  Administered 2016-09-10: 600 mg via INTRAVENOUS
  Filled 2016-09-10: qty 50

## 2016-09-10 MED ORDER — HEPARIN SOD (PORK) LOCK FLUSH 100 UNIT/ML IV SOLN
500.0000 [IU] | Freq: Once | INTRAVENOUS | Status: AC | PRN
  Administered 2016-09-10: 500 [IU]
  Filled 2016-09-10: qty 5

## 2016-09-10 MED ORDER — ACETAMINOPHEN 325 MG PO TABS
650.0000 mg | ORAL_TABLET | Freq: Once | ORAL | Status: AC
  Administered 2016-09-10: 650 mg via ORAL

## 2016-09-10 MED ORDER — ACETAMINOPHEN 325 MG PO TABS
ORAL_TABLET | ORAL | Status: AC
  Filled 2016-09-10: qty 2

## 2016-09-10 MED ORDER — SODIUM CHLORIDE 0.9 % IJ SOLN
10.0000 mL | INTRAMUSCULAR | Status: DC | PRN
  Administered 2016-09-10: 10 mL
  Filled 2016-09-10: qty 10

## 2016-09-10 MED ORDER — DIPHENHYDRAMINE HCL 25 MG PO CAPS
ORAL_CAPSULE | ORAL | Status: AC
  Filled 2016-09-10: qty 2

## 2016-09-10 NOTE — Patient Instructions (Signed)
[facility] Discharge Instructions for Patients Receiving Chemotherapy  Today you received the following chemotherapy agents Rituxan  To help prevent nausea and vomiting after your treatment, we encourage you to take your nausea medication    If you develop nausea and vomiting that is not controlled by your nausea medication, call the clinic.   BELOW ARE SYMPTOMS THAT SHOULD BE REPORTED IMMEDIATELY:  *FEVER GREATER THAN 100.5 F  *CHILLS WITH OR WITHOUT FEVER  NAUSEA AND VOMITING THAT IS NOT CONTROLLED WITH YOUR NAUSEA MEDICATION  *UNUSUAL SHORTNESS OF BREATH  *UNUSUAL BRUISING OR BLEEDING  TENDERNESS IN MOUTH AND THROAT WITH OR WITHOUT PRESENCE OF ULCERS  *URINARY PROBLEMS  *BOWEL PROBLEMS  UNUSUAL RASH Items with * indicate a potential emergency and should be followed up as soon as possible.  Feel free to call the clinic you have any questions or concerns. The clinic phone number is [phone].  Please show the CHEMO ALERT CARD at check-in to the Emergency Department and triage nurse.   

## 2016-09-10 NOTE — Progress Notes (Signed)
Hematology and Oncology Follow Up Visit  Shannon Parsons [id number] 06/01/1941 75 y.o. 09/10/2016   Principle Diagnosis:   Mantle cell lymphoma (MCIPI = 5)  Thromboembolic event of the right leg  Acquired Hypogammaglobulinemia  Bronchiectasis  Current Therapy:    Rituxan - maintanence - s/p cycle #5 - to finish in 07/2017  IVIG - q 2 months - on hold     Interim History:  [name] is back for follow-up. She really had a good Thanksgiving and Christmas. She and her husband always enjoy themselves.  She is feeling well. Her hair is growing nicely. She is exercising. She is feeling well.  She is eating okay. She's had no problems with nausea or vomiting. She has had some abdominal discomfort. She does have irritable bowel syndrome. She will be having a colonoscopy in January.  There's been no fever. She's had no rashes. She's had no bleeding.  Thankfully, her bronchiectasis has resolved. We gave her 1 dose of IVIG and this may have taking care of it.  Overall, her performance status is ECOG 1.  Medications:  Current Outpatient Prescriptions:  .  acetaminophen (TYLENOL) 325 MG tablet, Take 325 mg by mouth every 6 (six) hours as needed (For pain.). , Disp: , Rfl:  .  albuterol (PROVENTIL HFA;VENTOLIN HFA) 108 (90 BASE) MCG/ACT inhaler, Inhale 2 puffs into the lungs every 6 (six) hours as needed for wheezing or shortness of breath (cough)., Disp: 1 Inhaler, Rfl: 0 .  albuterol (PROVENTIL) (2.5 MG/3ML) 0.083% nebulizer solution, Take 3 mLs (2.5 mg total) by nebulization 4 (four) times daily., Disp: 360 mL, Rfl: 11 .  ALPRAZolam (XANAX) 0.5 MG tablet, TAKE 1/2 TO 1 TABLET BY MOUTH 3 TIMES A DAY AS NEEDED FOR ANXIETY OR SLEEP., Disp: 90 tablet, Rfl: 5 .  Cholecalciferol (VITAMIN D) 2000 UNITS CAPS, Take 2,000-4,000 capsules by mouth every other day. Patient alternates 1 cap with 2 caps every other day., Disp: , Rfl:  .  hyoscyamine (LEVSIN, ANASPAZ) 0.125 MG tablet, Take 1 to  2 tablets every 4 hours as needed for nausea, cramping, bloating or diarrhea, Disp: 90 tablet, Rfl: 11 .  ipratropium (ATROVENT) 0.02 % nebulizer solution, Take 2.5 mLs (0.5 mg total) by nebulization 4 (four) times daily., Disp: 300 mL, Rfl: 11 .  ipratropium-albuterol (DUONEB) 0.5-2.5 (3) MG/3ML SOLN, Take 3 mLs by nebulization every 6 (six) hours as needed., Disp: 360 mL, Rfl: 0 .  levothyroxine (SYNTHROID, LEVOTHROID) 50 MCG tablet, TAKE 1 TABLET BY MOUTH ONCE DAILY., Disp: 90 tablet, Rfl: 3 .  lidocaine-prilocaine (EMLA) cream, Apply 1 application topically as needed. Apply to [facility] one hour before access., Disp: 30 g, Rfl: 3 .  LORazepam (ATIVAN) 0.5 MG tablet, Take 1 tablet (0.5 mg total) by mouth every 6 (six) hours as needed (Nausea or vomiting)., Disp: 30 tablet, Rfl: 0 .  Probiotic Product (PROBIOTIC DAILY PO), Take 1 capsule by mouth daily., Disp: , Rfl:  .  Respiratory Therapy Supplies (FLUTTER) DEVI, Take as directed, Disp: 1 each, Rfl: 0  Allergies:  Allergies  Allergen Reactions  . Actonel [Risedronate Sodium] Other (See Comments)    "made me choke"   . Fosamax [Alendronate] Other (See Comments)    "made me choke"  . Sulfa Antibiotics Other (See Comments)    Doesn't remember   . Tetracyclines & Related Rash    Past Medical History, Surgical history, Social history, and Family History were reviewed and updated.  Review of Systems: As above  Physical Exam:  vitals were not taken for this visit.  Thin but fairly well-nourished white female. Head and neck exam shows no ocular or oral lesions. She has no bilateral cervical or supraclavicular lymph nodes. She has no palpable thyroid. Lungs are clear. Cardiac exam regular rate and rhythm with no murmurs, rubs or bruits. Axillary exam shows no obvious bilateral axillary adenopathy. Abdomen is soft. She has decent bowel sounds. There is no fluid wave. Her spleen tip is just below the left costal margin. There is no hepatomegaly. Back  exam shows no tenderness over the spine, ribs or hips. Extremities shows no clubbing, cyanosis or edema. No palpable venous cord is noted in the right leg. She has good range of motion of her joints. She has good strength in her muscles. Neurological exam is nonfocal. Skin exam shows no rashes, ecchymoses or petechia  Lab Results  Component Value Date   WBC 5.4 09/10/2016   HGB 12.4 09/10/2016   HCT 36.9 09/10/2016   MCV 91 09/10/2016   PLT 191 09/10/2016     Chemistry      Component Value Date/Time   NA 141 09/10/2016 0738   NA 138 08/01/2015 1124   K 3.6 09/10/2016 0738   K 4.0 08/01/2015 1124   CL 104 09/10/2016 0738   CO2 27 09/10/2016 0738   CO2 28 08/01/2015 1124   BUN 10 09/10/2016 0738   BUN 10.4 08/01/2015 1124   CREATININE 1.0 09/10/2016 0738   CREATININE 0.8 08/01/2015 1124      Component Value Date/Time   CALCIUM 9.3 09/10/2016 0738   CALCIUM 9.6 08/01/2015 1124   ALKPHOS 62 09/10/2016 0738   ALKPHOS 88 08/01/2015 1124   AST 33 09/10/2016 0738   AST 23 08/01/2015 1124   ALT 20 09/10/2016 0738   ALT 12 08/01/2015 1124   BILITOT 0.70 09/10/2016 0738   BILITOT <0.30 08/01/2015 1124         Impression and Plan: Shannon Parsons is 75 year old white female. She has mantle cell lymphoma. Again, by the [facility] she is an intermediate risk.  She received 2 different courses of chemotherapy. She cannot tolerate R-CHOP with Velcade substituting for vincristine. This however was incredibly helpful. We finished up with Rituxan/bendamustine. She completed this in August 2016.  We then started maintenance Rituxan. She started this in November 2016.  As such, she will finish 2 years of therapy in November 2018.  I don't see that we have to do any scans on her.  For now, I'll plan to get her back in 2 months.   Volanda Napoleon, MD 12/29/20179:19 AM

## 2016-09-10 NOTE — Patient Instructions (Signed)

## 2016-09-11 LAB — IGG, IGA, IGM
IGM (IMMUNOGLOBIN M), SRM: 5 mg/dL — AB (ref 26–217)
IgA, Qn, Serum: 29 mg/dL — ABNORMAL LOW (ref 64–422)
IgG, Qn, Serum: 615 mg/dL — ABNORMAL LOW (ref 700–1600)

## 2016-09-17 ENCOUNTER — Encounter: Payer: Self-pay | Admitting: *Deleted

## 2016-10-06 ENCOUNTER — Ambulatory Visit: Payer: Self-pay | Admitting: Physician Assistant

## 2016-10-30 IMAGING — CT CT NECK W/ CM
4 of 6 series · 14 of 33 positions shown, 16 images · IV contrast (75CC OMNI 300)
Comparison: Ultrasound August 23, 2014. CT scan of chest of

CLINICAL DATA: Cervical adenopathy.  Right-sided neck pain.

EXAM:
CT NECK WITH CONTRAST
TECHNIQUE: Multidetector CT imaging of the neck was performed using the
standard protocol following the bolus administration of intravenous
contrast.
CONTRAST:  75mL OMNIPAQUE IOHEXOL 300 MG/ML  SOLN

[Series 3: axial neck · axial · 0.39mm/px · z∈[+78,+153]mm · 2 of 92 slices shown]
[im 31/92  bone]
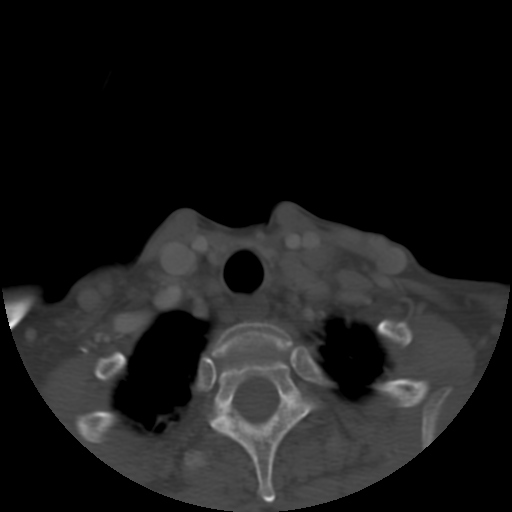
[im 61/92  bone]
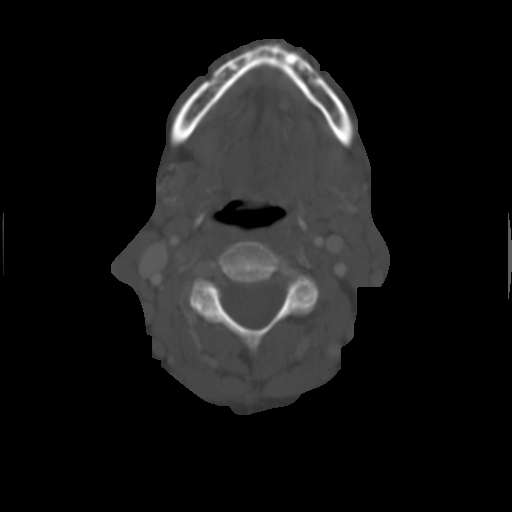

[Series 200: sagittal · sagittal · 0.46mm/px · 5 of 100 slices shown, 6 images]
[im 34/100  bone]
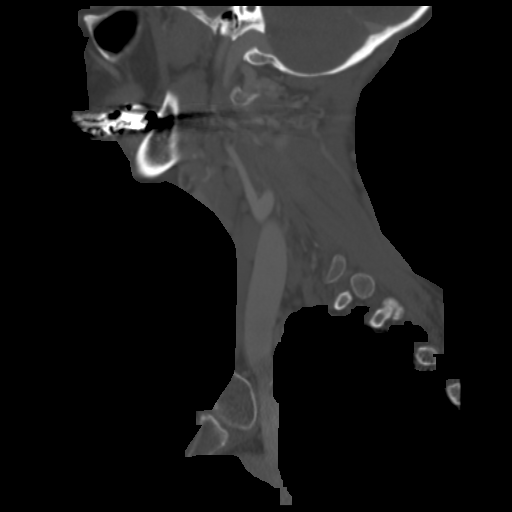
[im 42/100  bone]
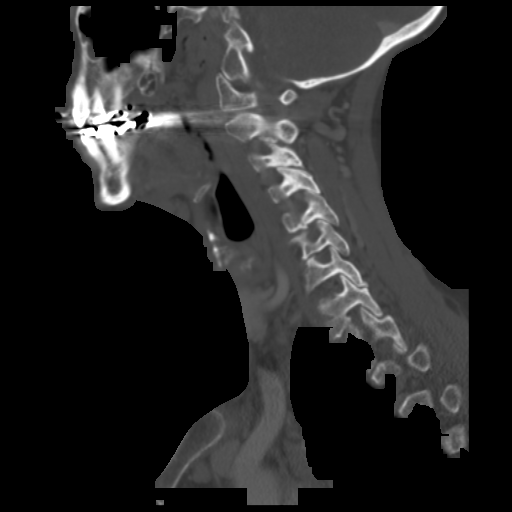
[im 50/100  soft-tissue]
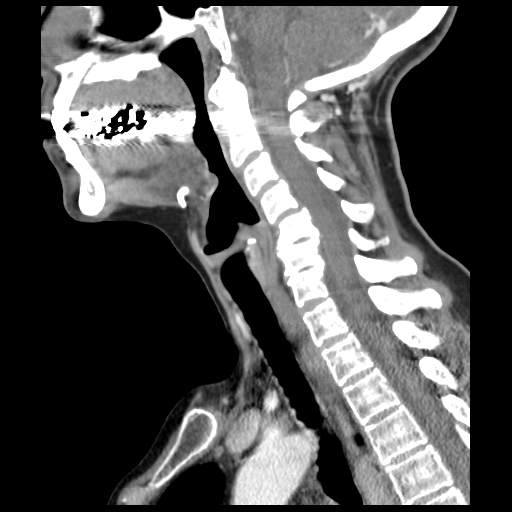
[im 50/100  bone]
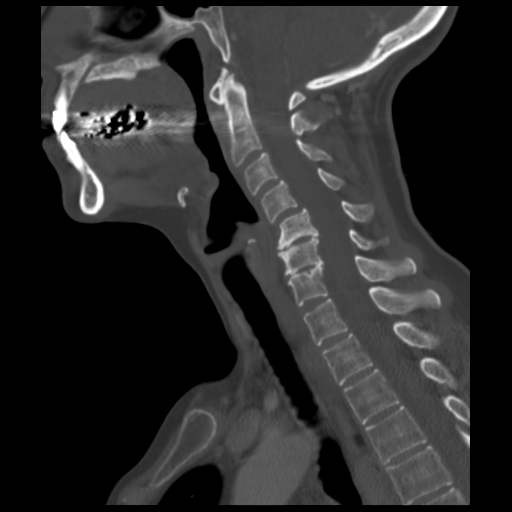
[im 58/100  bone]
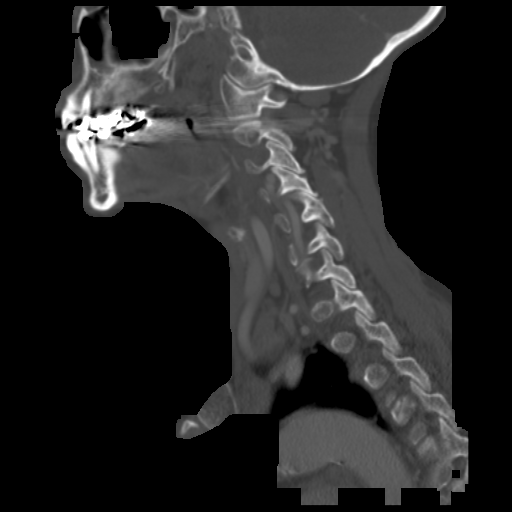
[im 67/100  bone]
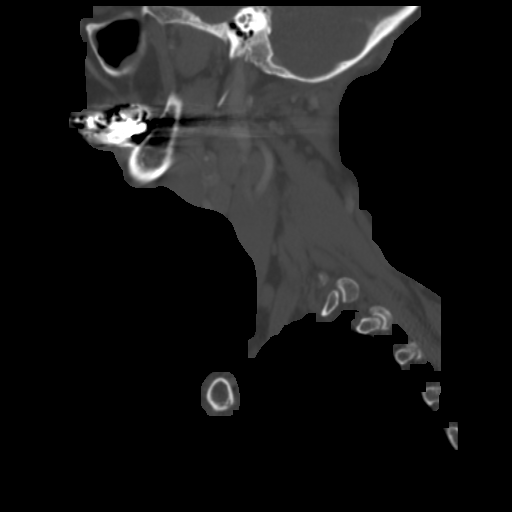

[Series 201: coronal · coronal · 0.46mm/px · 3 of 98 slices shown]
[im 28/98  bone]
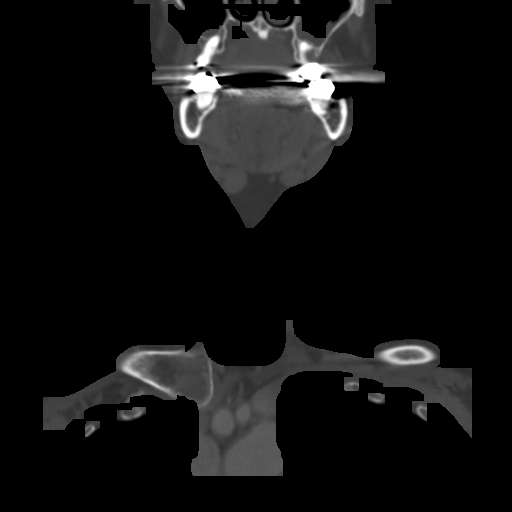
[im 42/98  bone]
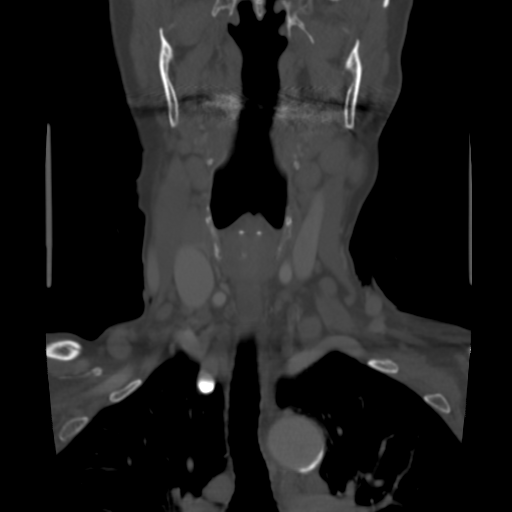
[im 56/98  bone]
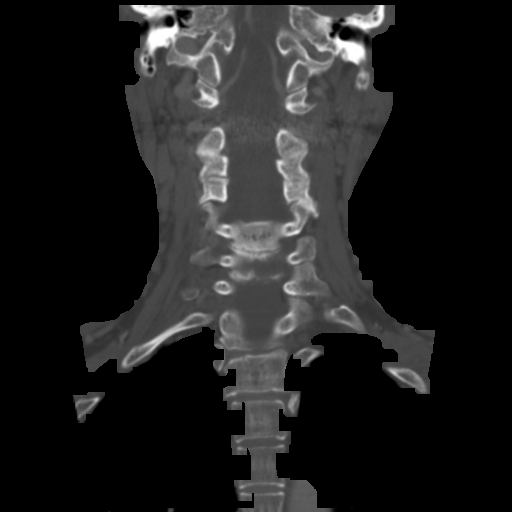

[Series 202: angled to hyoid · axial · 0.46mm/px · z∈[+17,+161]mm · 4 of 125 slices shown, 5 images]
[im 25/125  soft-tissue]
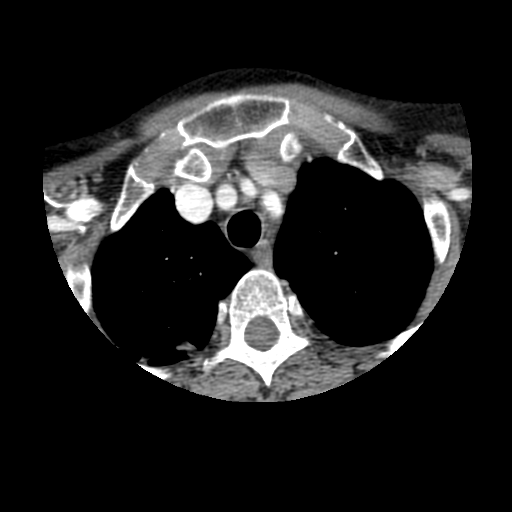
[im 25/125  bone]
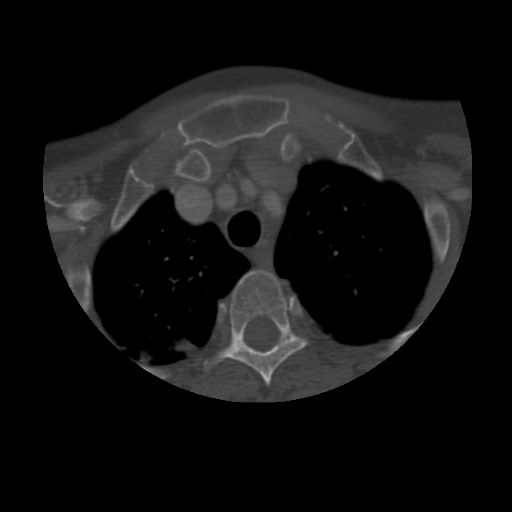
[im 50/125  bone]
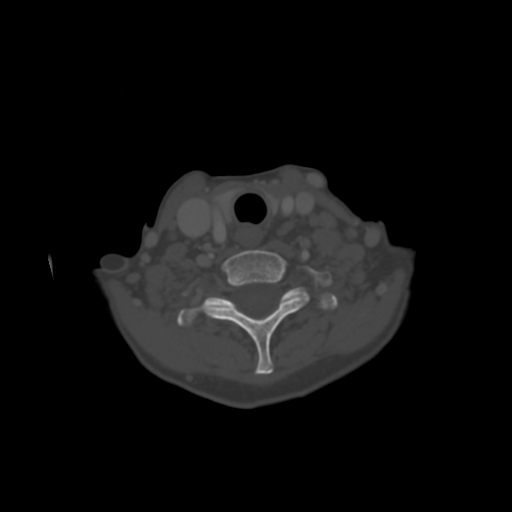
[im 75/125  bone]
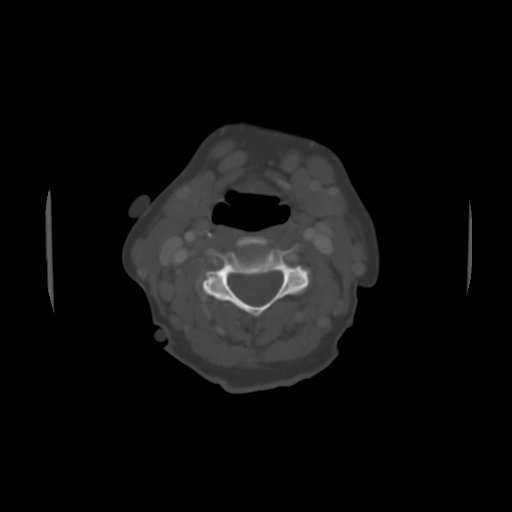
[im 100/125  bone]
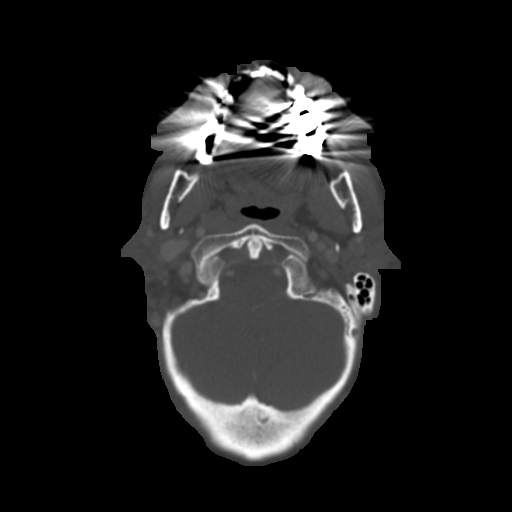

[14 of 33 positions shown; findings below may reference images not displayed]

FINDINGS: Pharynx and larynx: Epiglottis appears normal. Airway is patent.
Larynx appears normal.

Salivary glands: No definite salivary abnormality seen.

Thyroid: Normal.

Lymph nodes: Extensive adenopathy is seen involving the level 1, 2,
3 and 4 regions bilaterally. Largest lymph node measures 17 x 10 mm
in the right submandibular region. 20 x 8 mm lymph node is noted in
left occipital region.

Vascular: Visualized vasculature appears normal.

Limited intracranial: No definite abnormality visualized.

Mastoids and visualized paranasal sinuses: Mucosal thickening is
noted in the inferior portions of both maxillary sinuses, with left
greater than right.

Skeleton: Severe degenerative disc disease is noted at C5-6 and
C6-7.

Upper chest: No significant abnormality seen involving the lung
parenchyma of the visualize upper chest. 16 x 12 mm pretracheal
lymph node is noted which is enlarged compared to prior exam.
IMPRESSION: Extensive bilateral cervical adenopathy is noted, as well as
enlarged pretracheal lymph node seen in superior mediastinum which
is increased compared to prior exam. While this may simply be
inflammatory in origin, neoplasm or malignancy cannot be excluded
and tissue sampling is recommended.

## 2016-11-09 ENCOUNTER — Ambulatory Visit: Payer: PPO

## 2016-11-09 ENCOUNTER — Other Ambulatory Visit (HOSPITAL_BASED_OUTPATIENT_CLINIC_OR_DEPARTMENT_OTHER): Payer: PPO

## 2016-11-09 ENCOUNTER — Ambulatory Visit (HOSPITAL_BASED_OUTPATIENT_CLINIC_OR_DEPARTMENT_OTHER): Payer: PPO | Admitting: Hematology & Oncology

## 2016-11-09 ENCOUNTER — Ambulatory Visit (HOSPITAL_BASED_OUTPATIENT_CLINIC_OR_DEPARTMENT_OTHER): Payer: PPO

## 2016-11-09 VITALS — BP 117/80 | HR 68 | Temp 97.6°F | Resp 16

## 2016-11-09 DIAGNOSIS — C8310 Mantle cell lymphoma, unspecified site: Secondary | ICD-10-CM | POA: Diagnosis not present

## 2016-11-09 DIAGNOSIS — C8313 Mantle cell lymphoma, intra-abdominal lymph nodes: Secondary | ICD-10-CM

## 2016-11-09 DIAGNOSIS — D801 Nonfamilial hypogammaglobulinemia: Secondary | ICD-10-CM | POA: Diagnosis not present

## 2016-11-09 DIAGNOSIS — Z5112 Encounter for antineoplastic immunotherapy: Secondary | ICD-10-CM

## 2016-11-09 LAB — CBC WITH DIFFERENTIAL (CANCER CENTER ONLY)
BASO#: 0 10*3/uL (ref 0.0–0.2)
BASO%: 0.6 % (ref 0.0–2.0)
EOS ABS: 0.3 10*3/uL (ref 0.0–0.5)
EOS%: 4.5 % (ref 0.0–7.0)
HEMATOCRIT: 38.9 % (ref 34.8–46.6)
HEMOGLOBIN: 13.1 g/dL (ref 11.6–15.9)
LYMPH#: 0.6 10*3/uL — AB (ref 0.9–3.3)
LYMPH%: 8.6 % — ABNORMAL LOW (ref 14.0–48.0)
MCH: 30 pg (ref 26.0–34.0)
MCHC: 33.7 g/dL (ref 32.0–36.0)
MCV: 89 fL (ref 81–101)
MONO#: 0.9 10*3/uL (ref 0.1–0.9)
MONO%: 14 % — ABNORMAL HIGH (ref 0.0–13.0)
NEUT%: 72.3 % (ref 39.6–80.0)
NEUTROS ABS: 4.9 10*3/uL (ref 1.5–6.5)
Platelets: 212 10*3/uL (ref 145–400)
RBC: 4.37 10*6/uL (ref 3.70–5.32)
RDW: 13.4 % (ref 11.1–15.7)
WBC: 6.7 10*3/uL (ref 3.9–10.0)

## 2016-11-09 LAB — CMP (CANCER CENTER ONLY)
ALBUMIN: 3.6 g/dL (ref 3.3–5.5)
ALT(SGPT): 24 U/L (ref 10–47)
AST: 29 U/L (ref 11–38)
Alkaline Phosphatase: 71 U/L (ref 26–84)
BUN, Bld: 11 mg/dL (ref 7–22)
CALCIUM: 9.6 mg/dL (ref 8.0–10.3)
CHLORIDE: 102 meq/L (ref 98–108)
CO2: 28 mEq/L (ref 18–33)
Creat: 1 mg/dl (ref 0.6–1.2)
Glucose, Bld: 83 mg/dL (ref 73–118)
Potassium: 4.2 mEq/L (ref 3.3–4.7)
Sodium: 140 mEq/L (ref 128–145)
TOTAL PROTEIN: 6 g/dL — AB (ref 6.4–8.1)
Total Bilirubin: 0.6 mg/dl (ref 0.20–1.60)

## 2016-11-09 LAB — TECHNOLOGIST REVIEW CHCC SATELLITE

## 2016-11-09 LAB — LACTATE DEHYDROGENASE: LDH: 251 U/L — AB (ref 125–245)

## 2016-11-09 MED ORDER — ACETAMINOPHEN 325 MG PO TABS
ORAL_TABLET | ORAL | Status: AC
  Filled 2016-11-09: qty 2

## 2016-11-09 MED ORDER — SODIUM CHLORIDE 0.9 % IJ SOLN
10.0000 mL | INTRAMUSCULAR | Status: DC | PRN
  Administered 2016-11-09: 10 mL
  Filled 2016-11-09: qty 10

## 2016-11-09 MED ORDER — HEPARIN SOD (PORK) LOCK FLUSH 100 UNIT/ML IV SOLN
500.0000 [IU] | Freq: Once | INTRAVENOUS | Status: AC | PRN
  Administered 2016-11-09: 500 [IU]
  Filled 2016-11-09: qty 5

## 2016-11-09 MED ORDER — DIPHENHYDRAMINE HCL 25 MG PO CAPS
ORAL_CAPSULE | ORAL | Status: AC
  Filled 2016-11-09: qty 2

## 2016-11-09 MED ORDER — ACETAMINOPHEN 325 MG PO TABS
650.0000 mg | ORAL_TABLET | Freq: Once | ORAL | Status: AC
  Administered 2016-11-09: 650 mg via ORAL

## 2016-11-09 MED ORDER — SODIUM CHLORIDE 0.9 % IV SOLN
375.0000 mg/m2 | Freq: Once | INTRAVENOUS | Status: AC
  Administered 2016-11-09: 600 mg via INTRAVENOUS
  Filled 2016-11-09: qty 50

## 2016-11-09 MED ORDER — DIPHENHYDRAMINE HCL 25 MG PO CAPS
50.0000 mg | ORAL_CAPSULE | Freq: Once | ORAL | Status: AC
  Administered 2016-11-09: 50 mg via ORAL

## 2016-11-09 MED ORDER — SODIUM CHLORIDE 0.9 % IV SOLN
Freq: Once | INTRAVENOUS | Status: AC
  Administered 2016-11-09: 11:00:00 via INTRAVENOUS

## 2016-11-09 NOTE — Progress Notes (Signed)
Hematology and Oncology Follow Up Visit  Shannon Parsons [id number] September 27, 1940 76 y.o. 11/09/2016   Principle Diagnosis:   Mantle cell lymphoma (MCIPI = 5)  Thromboembolic event of the right leg  Acquired Hypogammaglobulinemia  Bronchiectasis  Current Therapy:    Rituxan - maintanence - s/p cycle #8 - to finish in 07/2017  IVIG - q 2 months - on hold     Interim History:  Ms.  Sortor is back for follow-up. She is looking quite good. She has got over the bronchiectasis. I'm not sure of this just "ran its course" or if the IVIG that we gave her helped.  She is having a little bit of chest discomfort. There is no shortness of breath. It sounds more musculoskeletal.  She's had no nausea or vomiting. She's had no change in bowel or bladder habits. She is going for a colonoscopy in a couple weeks. I don't see any problems with her having this.  She's had no problems with rashes. There's been no leg swelling.  She has gone through the wintertime really without problems with influenza.  Overall, her performance status is ECOG 1.  Medications:  Current Outpatient Prescriptions:  .  acetaminophen (TYLENOL) 325 MG tablet, Take 325 mg by mouth every 6 (six) hours as needed (For pain.). , Disp: , Rfl:  .  albuterol (PROVENTIL HFA;VENTOLIN HFA) 108 (90 BASE) MCG/ACT inhaler, Inhale 2 puffs into the lungs every 6 (six) hours as needed for wheezing or shortness of breath (cough). (Patient not taking: Reported on 11/09/2016), Disp: 1 Inhaler, Rfl: 0 .  albuterol (PROVENTIL) (2.5 MG/3ML) 0.083% nebulizer solution, Take 3 mLs (2.5 mg total) by nebulization 4 (four) times daily. (Patient not taking: Reported on 11/09/2016), Disp: 360 mL, Rfl: 11 .  ALPRAZolam (XANAX) 0.5 MG tablet, TAKE 1/2 TO 1 TABLET BY MOUTH 3 TIMES A DAY AS NEEDED FOR ANXIETY OR SLEEP., Disp: 90 tablet, Rfl: 5 .  Cholecalciferol (VITAMIN D) 2000 UNITS CAPS, Take 2,000-4,000 capsules by mouth every other day. Patient  alternates 1 cap with 2 caps every other day., Disp: , Rfl:  .  hyoscyamine (LEVSIN, ANASPAZ) 0.125 MG tablet, Take 1 to 2 tablets every 4 hours as needed for nausea, cramping, bloating or diarrhea, Disp: 90 tablet, Rfl: 11 .  ipratropium (ATROVENT) 0.02 % nebulizer solution, Take 2.5 mLs (0.5 mg total) by nebulization 4 (four) times daily. (Patient not taking: Reported on 11/09/2016), Disp: 300 mL, Rfl: 11 .  ipratropium-albuterol (DUONEB) 0.5-2.5 (3) MG/3ML SOLN, Take 3 mLs by nebulization every 6 (six) hours as needed. (Patient not taking: Reported on 11/09/2016), Disp: 360 mL, Rfl: 0 .  levothyroxine (SYNTHROID, LEVOTHROID) 50 MCG tablet, TAKE 1 TABLET BY MOUTH ONCE DAILY., Disp: 90 tablet, Rfl: 3 .  lidocaine-prilocaine (EMLA) cream, Apply 1 application topically as needed. Apply to [facility] one hour before access., Disp: 30 g, Rfl: 3 .  Probiotic Product (PROBIOTIC DAILY PO), Take 1 capsule by mouth daily., Disp: , Rfl:  .  Respiratory Therapy Supplies (FLUTTER) DEVI, Take as directed (Patient not taking: Reported on 11/09/2016), Disp: 1 each, Rfl: 0  Allergies:  Allergies  Allergen Reactions  . Actonel [Risedronate Sodium] Other (See Comments)    "made me choke"   . Fosamax [Alendronate] Other (See Comments)    "made me choke"  . Sulfa Antibiotics Other (See Comments)    Doesn't remember   . Tetracyclines & Related Rash    Past Medical History, Surgical history, Social history, and Family History were  reviewed and updated.  Review of Systems: As above  Physical Exam:  vitals were not taken for this visit.  Thin but fairly well-nourished white female. Head and neck exam shows no ocular or oral lesions. She has no bilateral cervical or supraclavicular lymph nodes. She has no palpable thyroid. Lungs are clear. Cardiac exam regular rate and rhythm with no murmurs, rubs or bruits. Axillary exam shows no obvious bilateral axillary adenopathy. Abdomen is soft. She has decent bowel sounds. There  is no fluid wave. Her spleen tip is just below the left costal margin. There is no hepatomegaly. Back exam shows no tenderness over the spine, ribs or hips. Extremities shows no clubbing, cyanosis or edema. No palpable venous cord is noted in the right leg. She has good range of motion of her joints. She has good strength in her muscles. Neurological exam is nonfocal. Skin exam shows no rashes, ecchymoses or petechia  Lab Results  Component Value Date   WBC 6.7 11/09/2016   HGB 13.1 11/09/2016   HCT 38.9 11/09/2016   MCV 89 11/09/2016   PLT 212 11/09/2016     Chemistry      Component Value Date/Time   NA 140 11/09/2016 0950   NA 138 08/01/2015 1124   K 4.2 11/09/2016 0950   K 4.0 08/01/2015 1124   CL 102 11/09/2016 0950   CO2 28 11/09/2016 0950   CO2 28 08/01/2015 1124   BUN 11 11/09/2016 0950   BUN 10.4 08/01/2015 1124   CREATININE 1.0 11/09/2016 0950   CREATININE 0.8 08/01/2015 1124      Component Value Date/Time   CALCIUM 9.6 11/09/2016 0950   CALCIUM 9.6 08/01/2015 1124   ALKPHOS 71 11/09/2016 0950   ALKPHOS 88 08/01/2015 1124   AST 29 11/09/2016 0950   AST 23 08/01/2015 1124   ALT 24 11/09/2016 0950   ALT 12 08/01/2015 1124   BILITOT 0.60 11/09/2016 0950   BILITOT <0.30 08/01/2015 1124         Impression and Plan: [name] is 76 year old white female. She has mantle cell lymphoma. Again, by the [facility] she is an intermediate risk.  She received 2 different courses of chemotherapy. She Could not tolerate R-CHOP with Velcade substituting for vincristine. This however was incredibly helpful. We finished up with Rituxan/bendamustine. She completed this in August 2016.  We then started maintenance Rituxan. She started this in November 2016.  As such, she will finish 2 years of therapy in November 2018.  I don't see that we have to do any scans on her.  For now, I'll plan to get her back in 2 months.   Volanda Napoleon, MD 2/27/201810:27 AM

## 2016-11-09 NOTE — Patient Instructions (Signed)

## 2016-11-09 NOTE — Patient Instructions (Signed)
[facility] Discharge Instructions for Patients Receiving Chemotherapy  Today you received the following chemotherapy agents Rituxan  To help prevent nausea and vomiting after your treatment, we encourage you to take your nausea medication    If you develop nausea and vomiting that is not controlled by your nausea medication, call the clinic.   BELOW ARE SYMPTOMS THAT SHOULD BE REPORTED IMMEDIATELY:  *FEVER GREATER THAN 100.5 F  *CHILLS WITH OR WITHOUT FEVER  NAUSEA AND VOMITING THAT IS NOT CONTROLLED WITH YOUR NAUSEA MEDICATION  *UNUSUAL SHORTNESS OF BREATH  *UNUSUAL BRUISING OR BLEEDING  TENDERNESS IN MOUTH AND THROAT WITH OR WITHOUT PRESENCE OF ULCERS  *URINARY PROBLEMS  *BOWEL PROBLEMS  UNUSUAL RASH Items with * indicate a potential emergency and should be followed up as soon as possible.  Feel free to call the clinic you have any questions or concerns. The clinic phone number is [phone].  Please show the CHEMO ALERT CARD at check-in to the Emergency Department and triage nurse.   

## 2016-11-17 ENCOUNTER — Ambulatory Visit: Payer: PPO | Admitting: Gastroenterology

## 2016-11-23 ENCOUNTER — Ambulatory Visit (INDEPENDENT_AMBULATORY_CARE_PROVIDER_SITE_OTHER): Payer: PPO | Admitting: Physician Assistant

## 2016-11-23 ENCOUNTER — Encounter: Payer: Self-pay | Admitting: Physician Assistant

## 2016-11-23 VITALS — BP 126/66 | HR 86 | Temp 97.7°F | Resp 16 | Ht 63.5 in | Wt 124.6 lb

## 2016-11-23 DIAGNOSIS — D649 Anemia, unspecified: Secondary | ICD-10-CM

## 2016-11-23 DIAGNOSIS — I10 Essential (primary) hypertension: Secondary | ICD-10-CM | POA: Diagnosis not present

## 2016-11-23 DIAGNOSIS — F411 Generalized anxiety disorder: Secondary | ICD-10-CM

## 2016-11-23 DIAGNOSIS — C8313 Mantle cell lymphoma, intra-abdominal lymph nodes: Secondary | ICD-10-CM | POA: Diagnosis not present

## 2016-11-23 DIAGNOSIS — K5790 Diverticulosis of intestine, part unspecified, without perforation or abscess without bleeding: Secondary | ICD-10-CM

## 2016-11-23 DIAGNOSIS — E559 Vitamin D deficiency, unspecified: Secondary | ICD-10-CM | POA: Diagnosis not present

## 2016-11-23 DIAGNOSIS — K2100 Gastro-esophageal reflux disease with esophagitis, without bleeding: Secondary | ICD-10-CM

## 2016-11-23 DIAGNOSIS — M1990 Unspecified osteoarthritis, unspecified site: Secondary | ICD-10-CM | POA: Diagnosis not present

## 2016-11-23 DIAGNOSIS — J4489 Other specified chronic obstructive pulmonary disease: Secondary | ICD-10-CM

## 2016-11-23 DIAGNOSIS — Z860100 Personal history of colon polyps, unspecified: Secondary | ICD-10-CM

## 2016-11-23 DIAGNOSIS — Z79899 Other long term (current) drug therapy: Secondary | ICD-10-CM

## 2016-11-23 DIAGNOSIS — J449 Chronic obstructive pulmonary disease, unspecified: Secondary | ICD-10-CM | POA: Diagnosis not present

## 2016-11-23 DIAGNOSIS — M810 Age-related osteoporosis without current pathological fracture: Secondary | ICD-10-CM | POA: Diagnosis not present

## 2016-11-23 DIAGNOSIS — R7303 Prediabetes: Secondary | ICD-10-CM

## 2016-11-23 DIAGNOSIS — J471 Bronchiectasis with (acute) exacerbation: Secondary | ICD-10-CM | POA: Diagnosis not present

## 2016-11-23 DIAGNOSIS — I82401 Acute embolism and thrombosis of unspecified deep veins of right lower extremity: Secondary | ICD-10-CM | POA: Diagnosis not present

## 2016-11-23 DIAGNOSIS — R6889 Other general symptoms and signs: Secondary | ICD-10-CM

## 2016-11-23 DIAGNOSIS — Z0001 Encounter for general adult medical examination with abnormal findings: Secondary | ICD-10-CM | POA: Diagnosis not present

## 2016-11-23 DIAGNOSIS — D801 Nonfamilial hypogammaglobulinemia: Secondary | ICD-10-CM

## 2016-11-23 DIAGNOSIS — J479 Bronchiectasis, uncomplicated: Secondary | ICD-10-CM

## 2016-11-23 DIAGNOSIS — R350 Frequency of micturition: Secondary | ICD-10-CM | POA: Diagnosis not present

## 2016-11-23 DIAGNOSIS — K589 Irritable bowel syndrome without diarrhea: Secondary | ICD-10-CM

## 2016-11-23 DIAGNOSIS — E782 Mixed hyperlipidemia: Secondary | ICD-10-CM

## 2016-11-23 DIAGNOSIS — Z0000 Encounter for general adult medical examination without abnormal findings: Secondary | ICD-10-CM

## 2016-11-23 DIAGNOSIS — G4733 Obstructive sleep apnea (adult) (pediatric): Secondary | ICD-10-CM | POA: Diagnosis not present

## 2016-11-23 LAB — BASIC METABOLIC PANEL WITH GFR
BUN: 17 mg/dL (ref 7–25)
CO2: 28 mmol/L (ref 20–31)
Calcium: 9.2 mg/dL (ref 8.6–10.4)
Chloride: 100 mmol/L (ref 98–110)
Creat: 0.83 mg/dL (ref 0.60–0.93)
GFR, EST AFRICAN AMERICAN: 81 mL/min (ref >=60)
GFR, EST NON AFRICAN AMERICAN: 70 mL/min (ref >=60)
GLUCOSE: 90 mg/dL (ref 65–99)
POTASSIUM: 4.3 mmol/L (ref 3.5–5.3)
Sodium: 136 mmol/L (ref 135–146)

## 2016-11-23 LAB — IRON AND TIBC
%SAT: 21 % (ref 11–50)
IRON: 69 ug/dL (ref 45–160)
TIBC: 322 ug/dL (ref 250–450)
UIBC: 253 ug/dL (ref 125–400)

## 2016-11-23 LAB — LIPID PANEL
Cholesterol: 239 mg/dL — ABNORMAL HIGH (ref <200)
HDL: 51 mg/dL (ref >50)
LDL CALC: 160 mg/dL — AB (ref <100)
TRIGLYCERIDES: 138 mg/dL (ref <150)
Total CHOL/HDL Ratio: 4.7 Ratio (ref <5.0)
VLDL: 28 mg/dL (ref <30)

## 2016-11-23 LAB — TSH: TSH: 2.16 mIU/L

## 2016-11-23 LAB — FERRITIN: Ferritin: 46 ng/mL (ref 20–288)

## 2016-11-23 LAB — HEPATIC FUNCTION PANEL
ALK PHOS: 71 U/L (ref 33–130)
ALT: 14 U/L (ref 6–29)
AST: 24 U/L (ref 10–35)
Albumin: 4 g/dL (ref 3.6–5.1)
BILIRUBIN DIRECT: 0.1 mg/dL (ref <=0.2)
Indirect Bilirubin: 0.3 mg/dL (ref 0.2–1.2)
TOTAL PROTEIN: 6.1 g/dL (ref 6.1–8.1)
Total Bilirubin: 0.4 mg/dL (ref 0.2–1.2)

## 2016-11-23 NOTE — Progress Notes (Addendum)
MEDICARE ANNUAL WELLNESS VISIT AND CPE  Assessment:    Essential hypertension - continue medications, DASH diet, exercise and monitor at home. Call if greater than 130/80.  -     Hepatic function panel -     BASIC METABOLIC PANEL WITH GFR -     TSH  Chronic obstructive airway disease with asthma ([place]) Continue follow up pulmonary  OSA (obstructive sleep apnea) May need auto titration CPAP  Obstructive bronchiectasis ([place]) Continue follow up pulmonary  Acute thromboembolism of deep veins of right lower extremity ([place]) Likely due to chemo/cancer, will monitor for signs, stay on bASA  Mantle cell lymphoma of intra-abdominal lymph nodes ([place]) Continue follow up oncology  GERD Continue PPI/H2 blocker, diet discussed  Irritable bowel syndrome, unspecified type If not on benefiber then add it, decrease stress,  if any worsening symptoms, blood in stool, AB pain, etc call office  Diverticulosis of intestine without bleeding, unspecified intestinal tract location Increase fiber, if any worsening symptoms, blood in stool, AB pain, etc call office  Osteoporosis, unspecified osteoporosis type, unspecified pathological fracture presence Continue supplement  Osteoarthritis, unspecified osteoarthritis type, unspecified site monitor  Anxiety state continue medications, stress management techniques discussed, increase water, good sleep hygiene discussed, increase exercise, and increase veggies.   History of colonic polyps due  Mixed hyperlipidemia -     Lipid panel  Vitamin D deficiency -     VITAMIN D 25 Hydroxy (Vit-D Deficiency, Fractures)  Prediabetes Discussed general issues about diabetes pathophysiology and management., Educational material distributed., Suggested low cholesterol diet., Encouraged aerobic exercise., Discussed foot care., Reminded to get yearly retinal exam.  Medication management -     Magnesium  Normocytic anemia -     Ferritin -     Iron and  TIBC  Acquired hypogammaglobulinemia ([place]) Continue follow up oncology  Encounter for Medicare annual wellness exam Get colonoscopy  Urinary frequency -     Urinalysis, Routine w reflex microscopic -     Urine culture   Over 40 minutes of exam, counseling, chart review and critical decision making was performed Future Appointments Date Time Provider [place]  12/27/2016 9:00 AM Ladene Artist, MD [facility]  01/10/2017 8:00 AM [facility] LAB [facility] None  01/10/2017 8:15 AM [facility] B4 [facility] None  01/10/2017 8:30 AM Volanda Napoleon, MD [facility] None  01/10/2017 9:00 AM [facility] B4 [facility] None  03/01/2017 2:30 PM Unk Pinto, MD [facility] None  09/15/2017 2:00 PM Unk Pinto, MD [facility] None     Plan:   During the course of the visit the patient was educated and counseled about appropriate screening and preventive services including:    Pneumococcal vaccine   Prevnar 13  Influenza vaccine  Td vaccine  Screening electrocardiogram  Bone densitometry screening  Colorectal cancer screening  Diabetes screening  Glaucoma screening  Nutrition counseling   Advanced directives: requested   Subjective:  Shannon Parsons is a 76 y.o. female who presents for Medicare Annual Wellness Visit and complete physical.     Her blood pressure has been controlled at home, today their BP is BP: 126/66 She does not workout. She denies chest pain, shortness of breath, dizziness.  She has a history of Mantle cell lymphoma, thromboembolic right leg due to cancer/chemo, and bronchiectasis and acquired hypogammaglobulinemia, she continues to follow with Dr. Marin Olp, and is doing very well. Her breathing is 100% better and her energy level is back to normal. She is having a hard time using her CPAP at  night.  She is not on cholesterol medication and denies myalgias. Her cholesterol is not at goal. The cholesterol last visit was:   Lab Results  Component Value Date    CHOL 232 (H) 08/23/2016   HDL 65 08/23/2016   LDLCALC 143 (H) 08/23/2016   TRIG 118 08/23/2016   CHOLHDL 3.6 08/23/2016   Last A1C Lab Results  Component Value Date   HGBA1C 5.4 08/23/2016   Last GFR: Lab Results  Component Value Date   GFRNONAA 65 08/23/2016   Patient is on Vitamin D supplement.   Lab Results  Component Value Date   VD25OH 63 08/23/2016     She is on thyroid medication. Her medication was not changed last visit.   Lab Results  Component Value Date   TSH 2.08 08/23/2016   BMI is Body mass index is 21.73 kg/m., she is working on diet and exercise. Wt Readings from Last 3 Encounters:  11/23/16 124 lb 9.6 oz (56.5 kg)  11/09/16 120 lb 8 oz (54.7 kg)  08/23/16 120 lb 9.6 oz (54.7 kg)     Medication Review: Current Outpatient Prescriptions on File Prior to Visit  Medication Sig Dispense Refill  . acetaminophen (TYLENOL) 325 MG tablet Take 325 mg by mouth every 6 (six) hours as needed (For pain.).     [name] albuterol (PROVENTIL HFA;VENTOLIN HFA) 108 (90 BASE) MCG/ACT inhaler Inhale 2 puffs into the lungs every 6 (six) hours as needed for wheezing or shortness of breath (cough). (Patient not taking: Reported on 11/09/2016) 1 Inhaler 0  . albuterol (PROVENTIL) (2.5 MG/3ML) 0.083% nebulizer solution Take 3 mLs (2.5 mg total) by nebulization 4 (four) times daily. (Patient not taking: Reported on 11/09/2016) 360 mL 11  . ALPRAZolam (XANAX) 0.5 MG tablet TAKE 1/2 TO 1 TABLET BY MOUTH 3 TIMES A DAY AS NEEDED FOR ANXIETY OR SLEEP. 90 tablet 5  . Cholecalciferol (VITAMIN D) 2000 UNITS CAPS Take 2,000-4,000 capsules by mouth every other day. Patient alternates 1 cap with 2 caps every other day.    . hyoscyamine (LEVSIN, ANASPAZ) 0.125 MG tablet Take 1 to 2 tablets every 4 hours as needed for nausea, cramping, bloating or diarrhea 90 tablet 11  . ipratropium (ATROVENT) 0.02 % nebulizer solution Take 2.5 mLs (0.5 mg total) by nebulization 4 (four) times daily. (Patient not  taking: Reported on 11/09/2016) 300 mL 11  . ipratropium-albuterol (DUONEB) 0.5-2.5 (3) MG/3ML SOLN Take 3 mLs by nebulization every 6 (six) hours as needed. (Patient not taking: Reported on 11/09/2016) 360 mL 0  . levothyroxine (SYNTHROID, LEVOTHROID) 50 MCG tablet TAKE 1 TABLET BY MOUTH ONCE DAILY. 90 tablet 3  . lidocaine-prilocaine (EMLA) cream Apply 1 application topically as needed. Apply to [facility] one hour before access. 30 g 3  . Probiotic Product (PROBIOTIC DAILY PO) Take 1 capsule by mouth daily.    [name] Respiratory Therapy Supplies (FLUTTER) DEVI Take as directed (Patient not taking: Reported on 11/09/2016) 1 each 0   No current facility-administered medications on file prior to visit.     Allergies  Allergen Reactions  . Actonel [Risedronate Sodium] Other (See Comments)    "made me choke"   . Fosamax [Alendronate] Other (See Comments)    "made me choke"  . Sulfa Antibiotics Other (See Comments)    Doesn't remember   . Tetracyclines & Related Rash    Current Problems (verified) Patient Active Problem List   Diagnosis Date Noted  . Mantle cell lymphoma of intra-abdominal lymph nodes ([place])  09/10/2016  . Acquired hypogammaglobulinemia ([place]) 04/05/2016  . Obstructive bronchiectasis ([place]) 03/18/2016  . Body mass index (BMI) of 20.0-20.9 in adult 07/21/2015  . Encounter for Medicare annual wellness exam 04/02/2015  . Leg DVT (deep venous thromboembolism), acute ([place])   . Normocytic anemia 01/04/2015  . Mantle cell lymphoma ([place]) 10/18/2014  . Essential tremor 07/15/2014  . Medication management 07/15/2014  . OSA (obstructive sleep apnea) 07/05/2014  . Anxiety state 02/27/2014  . Hyperlipidemia   . Hypertension   . GERD   . Osteoporosis  (Refuses treatment)   . Osteoarthritis   . Vitamin D deficiency   . Prediabetes   . IBS   . Diverticulosis   . History of colonic polyps 07/19/2011  . Chronic obstructive airway disease with asthma ([place]) 03/16/2011    Screening  Tests Immunization History  Administered Date(s) Administered  . Pneumococcal Conjugate-13 03/18/2016  . Pneumococcal Polysaccharide-23 06/18/2013  . Zoster 04/17/2013   Preventative care: Last colonoscopy: 2012 OVERDUE EGD 2015 Last mammogram: 2017 Last pap smear/pelvic exam: 2010   DEXA:2017  Prior vaccinations: TD or Tdap: declined  Influenza: declined Pneumococcal: 2014 Prevnar13: 2017 Shingles/Zostavax: 2014  Names of Other Physician/Practitioners you currently use: 1. [facility] here for primary care 2. [facility], eye doctor, last visit 2017 3. [place] dentist, last visit 2017 Patient Care Team: Unk Pinto, MD as PCP - General (Internal Medicine) Ladene Artist, MD as Consulting Physician (Gastroenterology) Satira Sark, MD as Consulting Physician (Cardiology) Kathee Delton, MD as Consulting Physician (Pulmonary Disease) Volanda Napoleon, MD as Consulting Physician (Oncology)  SURGICAL HISTORY She  has a past surgical history that includes Appendectomy; Tonsillectomy; Bunionectomy; Knee arthroscopy; and Umbilical hernia repair. FAMILY HISTORY Her family history includes Emphysema in her father; Heart attack (age of onset: 51) in her father; Heart disease in her brother and father; Hypertension in her mother; Rheum arthritis in her sister; Thyroid disease in her mother. SOCIAL HISTORY She  reports that she has never smoked. She has never used smokeless tobacco. She reports that she does not drink alcohol or use drugs.   MEDICARE WELLNESS OBJECTIVES: Physical activity: Current Exercise Habits: Home exercise routine, Type of exercise: walking, Time (Minutes): 20, Frequency (Times/Week): 3, Weekly Exercise (Minutes/Week): 60, Intensity: Mild Cardiac risk factors: Cardiac Risk Factors include: advanced age (>80men, >76 women);dyslipidemia;hypertension;sedentary lifestyle Depression/mood screen:   Depression screen [facility] 2/9  11/23/2016  Decreased Interest 0  Down, Depressed, Hopeless 0  PHQ - 2 Score 0  Some recent data might be hidden    ADLs:  In your present state of health, do you have any difficulty performing the following activities: 11/23/2016 08/23/2016  Hearing? N -  Vision? N N  Difficulty concentrating or making decisions? N N  Walking or climbing stairs? N N  Dressing or bathing? N N  Doing errands, shopping? N N  Some recent data might be hidden     Cognitive Testing  Alert? Yes  Normal Appearance?Yes  Oriented to person? Yes  Place? Yes   Time? Yes  Recall of three objects?  Yes  Can perform simple calculations? Yes  Displays appropriate judgment?Yes  Can read the correct time from a watch face?Yes  EOL planning: Does Patient Have a Medical Advance Directive?: Yes Type of Advance Directive: Healthcare Power of Attorney, Living will [place] in Chart?: No - copy requested  Review of Systems  Constitutional: Positive for malaise/fatigue. Negative for chills and fever.  HENT: Negative for congestion, sore throat and tinnitus.   Eyes: Negative.   Respiratory: Negative for cough, shortness of breath and wheezing.   Cardiovascular: Negative for chest pain, palpitations and leg swelling.  Gastrointestinal: Negative for blood in stool and melena.  Genitourinary: Negative for hematuria.  Musculoskeletal: Positive for myalgias.  Skin: Negative.   Neurological: Negative for dizziness, sensory change, loss of consciousness and headaches.  Psychiatric/Behavioral: Negative for depression. The patient is not nervous/anxious and does not have insomnia.      Objective:     Today's Vitals   11/23/16 1420  BP: 126/66  Pulse: 86  Resp: 16  Temp: 97.7 F (36.5 C)  SpO2: 97%  Weight: 124 lb 9.6 oz (56.5 kg)  Height: 5' 3.5" (1.613 m)  PainSc: 0-No pain   Body mass index is 21.73 kg/m.  General appearance: alert, no distress, WD/WN, female HEENT:  normocephalic, sclerae anicteric, TMs pearly, nares patent, no discharge or erythema, pharynx normal Oral cavity: MMM, no lesions Neck: supple, no lymphadenopathy, no thyromegaly, no masses Heart: RRR, normal S1, S2, no murmurs Lungs: CTA bilaterally, no wheezes, rhonchi, or rales Abdomen: +bs, soft, non tender, non distended, no masses, no hepatomegaly, no splenomegaly Musculoskeletal: nontender, no swelling, no obvious deformity Extremities: no edema, no cyanosis, no clubbing Pulses: 2+ symmetric, upper and lower extremities, normal cap refill Neurological: alert, oriented x 3, CN2-12 intact, strength normal upper extremities and lower extremities, sensation normal throughout, DTRs 2+ throughout, no cerebellar signs, gait normal Psychiatric: normal affect, behavior normal, pleasant   Medicare Attestation I have personally reviewed: The patient's medical and social history Their use of alcohol, tobacco or illicit drugs Their current medications and supplements The patient's functional ability including ADLs,fall risks, home safety risks, cognitive, and hearing and visual impairment Diet and physical activities Evidence for depression or mood disorders  The patient's weight, height, BMI, and visual acuity have been recorded in the chart.  I have made referrals, counseling, and provided education to the patient based on review of the above and I have provided the patient with a written personalized care plan for preventive services.     Vicie Mutters, PA-C   11/23/2016

## 2016-11-23 NOTE — Patient Instructions (Addendum)
Try the melatonin 5mg -20mg  dissolvable or gummy 30 mins before bed   11 Tips to Follow:  1. No caffeine after 3pm: Avoid beverages with caffeine (soda, tea, energy drinks, etc.) especially after 3pm. 2. Don't go to bed hungry: Have your evening meal at least 3 hrs. before going to sleep. It's fine to have a small bedtime snack such as a glass of milk and a few crackers but don't have a big meal. 3. Have a nightly routine before bed: Plan on "winding down" before you go to sleep. Begin relaxing about 1 hour before you go to bed. Try doing a quiet activity such as listening to calming music, reading a book or meditating. 4. Turn off the TV and ALL electronics including video games, tablets, laptops, etc. 1 hour before sleep, and keep them out of the bedroom. 5. Turn off your cell phone and all notifications (new email and text alerts) or even better, leave your phone outside your room while you sleep. Studies have shown that a part of your brain continues to respond to certain lights and sounds even while you're still asleep. 6. Make your bedroom quiet, dark and cool. If you can't control the noise, try wearing earplugs or using a fan to block out other sounds. 7. Practice relaxation techniques. Try reading a book or meditating or drain your brain by writing a list of what you need to do the next day. 8. Don't nap unless you feel sick: you'll have a better night's sleep. 9. Don't smoke, or quit if you do. Nicotine, alcohol, and marijuana can all keep you awake. Talk to your health care provider if you need help with substance use. 10. Most importantly, wake up at the same time every day (or within 1 hour of your usual wake up time) EVEN on the weekends. A regular wake up time promotes sleep hygiene and prevents sleep problems. 11. Reduce exposure to bright light in the last three hours of the day before going to sleep. Maintaining good sleep hygiene and having good sleep habits lower your risk of  developing sleep problems. Getting better sleep can also improve your concentration and alertness. Try the simple steps in this guide. If you still have trouble getting enough rest, make an appointment with your health care provider.  Claritin or loratadine cheapest but likely the weakest  Cheapest at [employer], [employer], [employer]

## 2016-11-24 ENCOUNTER — Telehealth: Payer: Self-pay | Admitting: Internal Medicine

## 2016-11-24 LAB — URINALYSIS, ROUTINE W REFLEX MICROSCOPIC
BILIRUBIN URINE: NEGATIVE
GLUCOSE, UA: NEGATIVE
HGB URINE DIPSTICK: NEGATIVE
KETONES UR: NEGATIVE
Nitrite: NEGATIVE
PH: 6.5 (ref 5.0–8.0)
Protein, ur: NEGATIVE
SPECIFIC GRAVITY, URINE: 1.008 (ref 1.001–1.035)

## 2016-11-24 LAB — VITAMIN D 25 HYDROXY (VIT D DEFICIENCY, FRACTURES): Vit D, 25-Hydroxy: 54 ng/mL (ref 30–100)

## 2016-11-24 LAB — URINALYSIS, MICROSCOPIC ONLY
Bacteria, UA: NONE SEEN [HPF]
Casts: NONE SEEN [LPF]
Crystals: NONE SEEN [HPF]
RBC / HPF: NONE SEEN RBC/HPF (ref <=2)
Squamous Epithelial / LPF: NONE SEEN [HPF] (ref <=5)
WBC UA: NONE SEEN WBC/HPF (ref <=5)
YEAST: NONE SEEN [HPF]

## 2016-11-24 LAB — URINE CULTURE: ORGANISM ID, BACTERIA: NO GROWTH

## 2016-11-24 LAB — MAGNESIUM: MAGNESIUM: 2 mg/dL (ref 1.5–2.5)

## 2016-11-24 NOTE — Telephone Encounter (Signed)
Spoke with Katrina at Dr. Idell Pickles office. States that Dr. Melford Aase is wanting to have a CPAP titration study completed. Katrina wanted to make sure that they wouldn't be stepping on our toes with doing this. Advised her that we have not seen the pt since 2015 in regards to her CPAP use. Katrina will proceed with the CPAP titration and let us know if pt needs to be set up for an appointment after that. Nothing further was needed.

## 2016-11-24 NOTE — Progress Notes (Signed)
Pt aware of lab results & voiced understanding of those results.

## 2016-12-01 NOTE — Addendum Note (Signed)
Addended by: Vicie Mutters R on: 12/01/2016 11:03 AM   Modules accepted: Orders

## 2016-12-08 ENCOUNTER — Telehealth: Payer: Self-pay | Admitting: Internal Medicine

## 2016-12-08 NOTE — Telephone Encounter (Signed)
What pressure setting for Auto Titration? please call back Barbaraann Rondo at [phone], ext [phone],  [facility] dept

## 2016-12-24 DIAGNOSIS — H5711 Ocular pain, right eye: Secondary | ICD-10-CM | POA: Diagnosis not present

## 2016-12-27 ENCOUNTER — Encounter: Payer: Self-pay | Admitting: Internal Medicine

## 2016-12-27 ENCOUNTER — Ambulatory Visit (INDEPENDENT_AMBULATORY_CARE_PROVIDER_SITE_OTHER): Payer: PPO | Admitting: Gastroenterology

## 2016-12-27 ENCOUNTER — Encounter: Payer: Self-pay | Admitting: Gastroenterology

## 2016-12-27 VITALS — BP 112/72 | HR 72 | Ht 63.0 in | Wt 120.5 lb

## 2016-12-27 DIAGNOSIS — Z86010 Personal history of colonic polyps: Secondary | ICD-10-CM

## 2016-12-27 DIAGNOSIS — K588 Other irritable bowel syndrome: Secondary | ICD-10-CM

## 2016-12-27 MED ORDER — HYOSCYAMINE SULFATE 0.125 MG PO TABS: ORAL_TABLET | ORAL | 11 refills | Status: DC

## 2016-12-27 MED ORDER — NA SULFATE-K SULFATE-MG SULF 17.5-3.13-1.6 GM/177ML PO SOLN: 1.0000 | Freq: Once | ORAL | 0 refills | Status: AC

## 2016-12-27 NOTE — Progress Notes (Signed)
    History of Present Illness: This is a 76 year old female returning for follow-up of IBS and a personal history of adenomatous colon polyps. She continues on chemotherapy for lymphoma and states she tolerates the treatments quite well without symptoms or cytopenias. She has frequent crampy lower abdominal pain, bloating and gas. Symptoms are improved with hyoscyamine and Gas-X. She denies any ongoing reflux symptoms. Denies weight loss, constipation, diarrhea, change in stool caliber, melena, hematochezia, nausea, vomiting, dysphagia, reflux symptoms, chest pain.   Current Medications, Allergies, Past Medical History, Past Surgical History, Family History and Social History were reviewed in [employer] record.  Physical Exam: General: Well developed, well nourished, no acute distress Head: Normocephalic and atraumatic Eyes:  sclerae anicteric, EOMI Ears: Normal auditory acuity Mouth: No deformity or lesions Lungs: Clear throughout to auscultation Heart: Regular rate and rhythm; no murmurs, rubs or bruits Abdomen: Soft, non tender and non distended. No masses, hepatosplenomegaly or hernias noted. Normal Bowel sounds Rectal: deferred to colonoscopy Musculoskeletal: Symmetrical with no gross deformities  Pulses:  Normal pulses noted Extremities: No clubbing, cyanosis, edema or deformities noted Neurological: Alert oriented x 4, grossly nonfocal Psychological:  Alert and cooperative. Normal mood and affect  Assessment and Recommendations:  1. IBS. Encouraged more frequent use of hyoscyamine and Gas-X. Refill hyoscyamine 1-2 every 4 hours as needed. Gas-X 4 times a day as needed.  2. Personal history of adenomatous colon polyps. She is several months overdue for her five-year surveillance colonoscopy and she would like to proceed with colonoscopy. The risks (including bleeding, perforation, infection, missed lesions, medication reactions and possible hospitalization or  surgery if complications occur), benefits, and alternatives to colonoscopy with possible biopsy and possible polypectomy were discussed with the patient and they consent to proceed.

## 2016-12-27 NOTE — Patient Instructions (Signed)
We have sent the following medications to your pharmacy for you to pick up at your convenience: Levsin.  You have been scheduled for a colonoscopy. Please follow written instructions given to you at your visit today.  Please pick up your prep supplies at the pharmacy within the next 1-3 days. If you use inhalers (even only as needed), please bring them with you on the day of your procedure. Your physician has requested that you go to www.startemmi.com and enter the access code given to you at your visit today. This web site gives a general overview about your procedure. However, you should still follow specific instructions given to you by our office regarding your preparation for the procedure.  Thank you for choosing me and [facility].  Pricilla Riffle. Dagoberto Ligas., MD., [facility]

## 2017-01-04 ENCOUNTER — Telehealth: Payer: Self-pay | Admitting: Internal Medicine

## 2017-01-04 NOTE — Telephone Encounter (Signed)
patient called and stated CPAP setting is still too high, too strong, please write another rx for [facility] to evaluate and  retitrate again.  I will call  Vinson Moselle Tech to determine what order he will need.

## 2017-01-10 ENCOUNTER — Ambulatory Visit (HOSPITAL_BASED_OUTPATIENT_CLINIC_OR_DEPARTMENT_OTHER): Payer: PPO

## 2017-01-10 ENCOUNTER — Ambulatory Visit (HOSPITAL_BASED_OUTPATIENT_CLINIC_OR_DEPARTMENT_OTHER): Payer: PPO | Admitting: Hematology & Oncology

## 2017-01-10 ENCOUNTER — Other Ambulatory Visit (HOSPITAL_BASED_OUTPATIENT_CLINIC_OR_DEPARTMENT_OTHER): Payer: PPO

## 2017-01-10 ENCOUNTER — Ambulatory Visit: Payer: PPO

## 2017-01-10 VITALS — BP 117/63 | HR 74 | Temp 98.1°F | Resp 16 | Wt 121.0 lb

## 2017-01-10 DIAGNOSIS — C8310 Mantle cell lymphoma, unspecified site: Secondary | ICD-10-CM

## 2017-01-10 DIAGNOSIS — Z5112 Encounter for antineoplastic immunotherapy: Secondary | ICD-10-CM | POA: Diagnosis not present

## 2017-01-10 DIAGNOSIS — D801 Nonfamilial hypogammaglobulinemia: Secondary | ICD-10-CM

## 2017-01-10 DIAGNOSIS — C8313 Mantle cell lymphoma, intra-abdominal lymph nodes: Secondary | ICD-10-CM

## 2017-01-10 LAB — CMP (CANCER CENTER ONLY)
ALBUMIN: 3.5 g/dL (ref 3.3–5.5)
ALT(SGPT): 20 U/L (ref 10–47)
AST: 31 U/L (ref 11–38)
Alkaline Phosphatase: 67 U/L (ref 26–84)
BUN, Bld: 13 mg/dL (ref 7–22)
CALCIUM: 9.2 mg/dL (ref 8.0–10.3)
CHLORIDE: 101 meq/L (ref 98–108)
CO2: 28 meq/L (ref 18–33)
Creat: 0.9 mg/dl (ref 0.6–1.2)
Glucose, Bld: 88 mg/dL (ref 73–118)
Potassium: 4.4 mEq/L (ref 3.3–4.7)
Sodium: 137 mEq/L (ref 128–145)
Total Bilirubin: 0.6 mg/dl (ref 0.20–1.60)
Total Protein: 5.9 g/dL — ABNORMAL LOW (ref 6.4–8.1)

## 2017-01-10 LAB — CBC WITH DIFFERENTIAL (CANCER CENTER ONLY)
BASO#: 0.2 10*3/uL (ref 0.0–0.2)
BASO%: 2.4 % — ABNORMAL HIGH (ref 0.0–2.0)
EOS ABS: 0.2 10*3/uL (ref 0.0–0.5)
EOS%: 2.6 % (ref 0.0–7.0)
HEMATOCRIT: 38.1 % (ref 34.8–46.6)
HEMOGLOBIN: 12.7 g/dL (ref 11.6–15.9)
LYMPH#: 0.7 10*3/uL — AB (ref 0.9–3.3)
LYMPH%: 11 % — ABNORMAL LOW (ref 14.0–48.0)
MCH: 30.2 pg (ref 26.0–34.0)
MCHC: 33.3 g/dL (ref 32.0–36.0)
MCV: 91 fL (ref 81–101)
MONO#: 0.7 10*3/uL (ref 0.1–0.9)
MONO%: 11 % (ref 0.0–13.0)
NEUT%: 73 % (ref 39.6–80.0)
NEUTROS ABS: 4.5 10*3/uL (ref 1.5–6.5)
Platelets: 195 10*3/uL (ref 145–400)
RBC: 4.2 10*6/uL (ref 3.70–5.32)
RDW: 14.2 % (ref 11.1–15.7)
WBC: 6.2 10*3/uL (ref 3.9–10.0)

## 2017-01-10 LAB — TECHNOLOGIST REVIEW CHCC SATELLITE

## 2017-01-10 LAB — LACTATE DEHYDROGENASE: LDH: 211 U/L (ref 125–245)

## 2017-01-10 MED ORDER — ACETAMINOPHEN 325 MG PO TABS
650.0000 mg | ORAL_TABLET | Freq: Once | ORAL | Status: AC
  Administered 2017-01-10: 650 mg via ORAL

## 2017-01-10 MED ORDER — HEPARIN SOD (PORK) LOCK FLUSH 100 UNIT/ML IV SOLN
500.0000 [IU] | Freq: Once | INTRAVENOUS | Status: AC | PRN
  Administered 2017-01-10: 500 [IU]
  Filled 2017-01-10: qty 5

## 2017-01-10 MED ORDER — DIPHENHYDRAMINE HCL 25 MG PO CAPS
50.0000 mg | ORAL_CAPSULE | Freq: Once | ORAL | Status: AC
  Administered 2017-01-10: 50 mg via ORAL

## 2017-01-10 MED ORDER — SODIUM CHLORIDE 0.9 % IV SOLN
Freq: Once | INTRAVENOUS | Status: AC
  Administered 2017-01-10: 09:00:00 via INTRAVENOUS

## 2017-01-10 MED ORDER — SODIUM CHLORIDE 0.9 % IV SOLN
375.0000 mg/m2 | Freq: Once | INTRAVENOUS | Status: AC
  Administered 2017-01-10: 600 mg via INTRAVENOUS
  Filled 2017-01-10: qty 50

## 2017-01-10 MED ORDER — DIPHENHYDRAMINE HCL 25 MG PO CAPS
ORAL_CAPSULE | ORAL | Status: AC
  Filled 2017-01-10: qty 2

## 2017-01-10 MED ORDER — ACETAMINOPHEN 325 MG PO TABS
ORAL_TABLET | ORAL | Status: AC
  Filled 2017-01-10: qty 2

## 2017-01-10 MED ORDER — SODIUM CHLORIDE 0.9 % IJ SOLN
10.0000 mL | INTRAMUSCULAR | Status: DC | PRN
  Administered 2017-01-10: 10 mL
  Filled 2017-01-10: qty 10

## 2017-01-10 NOTE — Progress Notes (Signed)
Hematology and Oncology Follow Up Visit  Shannon Parsons [id number] [birth year] 76 y.o. 01/10/2017   Principle Diagnosis:   Mantle cell lymphoma (MCIPI = 5)  Thromboembolic event of the right leg  Acquired Hypogammaglobulinemia  Bronchiectasis  Current Therapy:    Rituxan - maintanence - s/p cycle #8 - to finish in 07/2017  IVIG - q 2 months - on hold     Interim History:  [name] is back for follow-up. She is looking quite good.she really has had no problems since we last saw her. She and her husband haven't been married for over 67 years now. This is a true accomplishment.  She is worried about having a colonoscopy done. I told her that this really was important for her. I told her that she needs to have this done to make sure that nothing else develops and can be treated early. She will have this done on May 23.   She has had no nausea or vomiting. She's had no fever. She has had no bleeding. There's been no cough. She has had bronchiectasis in the past. This seems to be much better.   She's had no rashes. There's been no leg swelling.   Hopefully, she will have a little vacation this summer.   Overall, her performance status is ECOG 1.  Medications:  Current Outpatient Prescriptions:  .  acetaminophen (TYLENOL) 325 MG tablet, Take 325 mg by mouth every 6 (six) hours as needed (For pain.). , Disp: , Rfl:  .  albuterol (PROVENTIL HFA;VENTOLIN HFA) 108 (90 BASE) MCG/ACT inhaler, Inhale 2 puffs into the lungs every 6 (six) hours as needed for wheezing or shortness of breath (cough). (Patient not taking: Reported on 11/09/2016), Disp: 1 Inhaler, Rfl: 0 .  albuterol (PROVENTIL) (2.5 MG/3ML) 0.083% nebulizer solution, Take 3 mLs (2.5 mg total) by nebulization 4 (four) times daily. (Patient not taking: Reported on 11/09/2016), Disp: 360 mL, Rfl: 11 .  ALPRAZolam (XANAX) 0.5 MG tablet, TAKE 1/2 TO 1 TABLET BY MOUTH 3 TIMES A DAY AS NEEDED FOR ANXIETY OR SLEEP., Disp: 90 tablet,  Rfl: 5 .  Cholecalciferol (VITAMIN D) 2000 UNITS CAPS, Take 2,000-4,000 capsules by mouth every other day. Patient alternates 1 cap with 2 caps every other day., Disp: , Rfl:  .  hyoscyamine (LEVSIN, ANASPAZ) 0.125 MG tablet, Take 1 to 2 tablets every 4 hours as needed for nausea, cramping, bloating or diarrhea, Disp: 90 tablet, Rfl: 11 .  ipratropium (ATROVENT) 0.02 % nebulizer solution, Take 2.5 mLs (0.5 mg total) by nebulization 4 (four) times daily. (Patient not taking: Reported on 11/09/2016), Disp: 300 mL, Rfl: 11 .  ipratropium-albuterol (DUONEB) 0.5-2.5 (3) MG/3ML SOLN, Take 3 mLs by nebulization every 6 (six) hours as needed. (Patient not taking: Reported on 11/09/2016), Disp: 360 mL, Rfl: 0 .  levothyroxine (SYNTHROID, LEVOTHROID) 50 MCG tablet, TAKE 1 TABLET BY MOUTH ONCE DAILY., Disp: 90 tablet, Rfl: 3 .  lidocaine-prilocaine (EMLA) cream, Apply 1 application topically as needed. Apply to [facility] one hour before access., Disp: 30 g, Rfl: 3 .  Probiotic Product (PROBIOTIC DAILY PO), Take 1 capsule by mouth daily., Disp: , Rfl:  .  Respiratory Therapy Supplies (FLUTTER) DEVI, Take as directed (Patient not taking: Reported on 11/09/2016), Disp: 1 each, Rfl: 0  Allergies:  Allergies  Allergen Reactions  . Actonel [Risedronate Sodium] Other (See Comments)    "made me choke"   . Fosamax [Alendronate] Other (See Comments)    "made me choke"  .  Sulfa Antibiotics Other (See Comments)    Doesn't remember   . Tetracyclines & Related Rash    Past Medical History, Surgical history, Social history, and Family History were reviewed and updated.  Review of Systems: As above  Physical Exam:  vitals were not taken for this visit.  Thin but fairly well-nourished white female. Head and neck exam shows no ocular or oral lesions. She has no bilateral cervical or supraclavicular lymph nodes. She has no palpable thyroid. Lungs are clear. Cardiac exam regular rate and rhythm with no murmurs, rubs or  bruits. Axillary exam shows no obvious bilateral axillary adenopathy. Abdomen is soft. She has decent bowel sounds. There is no fluid wave. Her spleen tip is just below the left costal margin. There is no hepatomegaly. Back exam shows no tenderness over the spine, ribs or hips. Extremities shows no clubbing, cyanosis or edema. No palpable venous cord is noted in the right leg. She has good range of motion of her joints. She has good strength in her muscles. Neurological exam is nonfocal. Skin exam shows no rashes, ecchymoses or petechia  Lab Results  Component Value Date   WBC 6.7 11/09/2016   HGB 13.1 11/09/2016   HCT 38.9 11/09/2016   MCV 89 11/09/2016   PLT 212 11/09/2016     Chemistry      Component Value Date/Time   NA 136 11/23/2016 1526   NA 140 11/09/2016 0950   NA 138 08/01/2015 1124   K 4.3 11/23/2016 1526   K 4.2 11/09/2016 0950   K 4.0 08/01/2015 1124   CL 100 11/23/2016 1526   CL 102 11/09/2016 0950   CO2 28 11/23/2016 1526   CO2 28 11/09/2016 0950   CO2 28 08/01/2015 1124   BUN 17 11/23/2016 1526   BUN 11 11/09/2016 0950   BUN 10.4 08/01/2015 1124   CREATININE 0.83 11/23/2016 1526   CREATININE 0.8 08/01/2015 1124      Component Value Date/Time   CALCIUM 9.2 11/23/2016 1526   CALCIUM 9.6 11/09/2016 0950   CALCIUM 9.6 08/01/2015 1124   ALKPHOS 71 11/23/2016 1526   ALKPHOS 71 11/09/2016 0950   ALKPHOS 88 08/01/2015 1124   AST 24 11/23/2016 1526   AST 29 11/09/2016 0950   AST 23 08/01/2015 1124   ALT 14 11/23/2016 1526   ALT 24 11/09/2016 0950   ALT 12 08/01/2015 1124   BILITOT 0.4 11/23/2016 1526   BILITOT 0.60 11/09/2016 0950   BILITOT <0.30 08/01/2015 1124         Impression and Plan: Shannon Parsons is 76 year old white female. She has mantle cell lymphoma. Again, by the [facility] she is an intermediate risk.  She received 2 different courses of chemotherapy. She culd not tolerate R-CHOP with Velcade substituting for vincristine. This however was  incredibly helpful. We finished up with Rituxan/bendamustine. She completed this in August 2016.  We then started maintenance Rituxan. She started this in November 2016.  As such, she will finish 2 years of therapy in November 2018.  I don't see that we have to do any scans on her.  For now, I'll plan to get her back in 2 months.   Volanda Napoleon, MD 4/30/20188:28 AM

## 2017-01-10 NOTE — Patient Instructions (Signed)
[facility] Discharge Instructions for Patients Receiving Chemotherapy  Today you received the following chemotherapy agents Rituxan  To help prevent nausea and vomiting after your treatment, we encourage you to take your nausea medication    If you develop nausea and vomiting that is not controlled by your nausea medication, call the clinic.   BELOW ARE SYMPTOMS THAT SHOULD BE REPORTED IMMEDIATELY:  *FEVER GREATER THAN 100.5 F  *CHILLS WITH OR WITHOUT FEVER  NAUSEA AND VOMITING THAT IS NOT CONTROLLED WITH YOUR NAUSEA MEDICATION  *UNUSUAL SHORTNESS OF BREATH  *UNUSUAL BRUISING OR BLEEDING  TENDERNESS IN MOUTH AND THROAT WITH OR WITHOUT PRESENCE OF ULCERS  *URINARY PROBLEMS  *BOWEL PROBLEMS  UNUSUAL RASH Items with * indicate a potential emergency and should be followed up as soon as possible.  Feel free to call the clinic you have any questions or concerns. The clinic phone number is [phone].  Please show the CHEMO ALERT CARD at check-in to the Emergency Department and triage nurse.   

## 2017-01-19 ENCOUNTER — Encounter: Payer: Self-pay | Admitting: Gastroenterology

## 2017-02-02 ENCOUNTER — Encounter: Payer: Self-pay | Admitting: Gastroenterology

## 2017-02-02 ENCOUNTER — Ambulatory Visit (AMBULATORY_SURGERY_CENTER): Payer: PPO | Admitting: Gastroenterology

## 2017-02-02 VITALS — BP 106/46 | HR 67 | Temp 97.3°F | Resp 15 | Ht 63.0 in | Wt 120.0 lb

## 2017-02-02 DIAGNOSIS — Z1211 Encounter for screening for malignant neoplasm of colon: Secondary | ICD-10-CM | POA: Diagnosis not present

## 2017-02-02 DIAGNOSIS — Z860101 Personal history of adenomatous and serrated colon polyps: Secondary | ICD-10-CM

## 2017-02-02 DIAGNOSIS — Z86010 Personal history of colonic polyps: Secondary | ICD-10-CM

## 2017-02-02 MED ORDER — SODIUM CHLORIDE 0.9 % IV SOLN: 500.0000 mL | INTRAVENOUS | Status: DC

## 2017-02-02 NOTE — Op Note (Signed)
[facility] Patient Name: Shannon Parsons Procedure Date: 02/02/2017 2:01 PM MRN: [id number] Endoscopist: Ladene Artist , MD Age: 76 Referring MD:  Date of Birth: 12-08-1940 Gender: Female Account #: [id number] Procedure:                Colonoscopy Indications:              Surveillance: Personal history of adenomatous                            polyps on last colonoscopy 5 years ago Medicines:                Monitored Anesthesia Care Procedure:                Pre-Anesthesia Assessment:                           - Prior to the procedure, a History and Physical                            was performed, and patient medications and                            allergies were reviewed. The patient's tolerance of                            previous anesthesia was also reviewed. The risks                            and benefits of the procedure and the sedation                            options and risks were discussed with the patient.                            All questions were answered, and informed consent                            was obtained. Prior Anticoagulants: The patient has                            taken no previous anticoagulant or antiplatelet                            agents. ASA Grade Assessment: II - A patient with                            mild systemic disease. After reviewing the risks                            and benefits, the patient was deemed in                            satisfactory condition to undergo the procedure.  After obtaining informed consent, the colonoscope                            was passed under direct vision. Throughout the                            procedure, the patient's blood pressure, pulse, and                            oxygen saturations were monitored continuously. The                            Colonoscope was introduced through the anus and                            advanced to the the  cecum, identified by                            appendiceal orifice and ileocecal valve. The                            ileocecal valve, appendiceal orifice, and rectum                            were photographed. The quality of the bowel                            preparation was excellent. The colonoscopy was                            performed without difficulty. The patient tolerated                            the procedure well. Scope In: 2:12:07 PM Scope Out: 2:23:26 PM Scope Withdrawal Time: 0 hours 7 minutes 17 seconds  Total Procedure Duration: 0 hours 11 minutes 19 seconds  Findings:                 The perianal and digital rectal examinations were                            normal.                           Multiple medium-mouthed diverticula were found in                            the sigmoid colon. There was no evidence of                            diverticular bleeding.                           The exam was otherwise without abnormality on  direct and retroflexion views. Complications:            No immediate complications. Estimated blood loss:                            None. Estimated Blood Loss:     Estimated blood loss: none. Impression:               - Mild diverticulosis in the sigmoid colon. There                            was no evidence of diverticular bleeding.                           - The examination was otherwise normal on direct                            and retroflexion views.                           - No specimens collected. Recommendation:           - Repeat colonoscopy in 5 years for surveillance.                           - Patient has a contact number available for                            emergencies. The signs and symptoms of potential                            delayed complications were discussed with the                            patient. Return to normal activities tomorrow.                             Written discharge instructions were provided to the                            patient.                           - Resume previous diet.                           - Continue present medications. Ladene Artist, MD 02/02/2017 2:28:41 PM This report has been signed electronically.

## 2017-02-02 NOTE — Progress Notes (Signed)
A and O x3. Report to RN. Tolerated MAC anesthesia well.

## 2017-02-02 NOTE — Patient Instructions (Signed)
YOU HAD AN ENDOSCOPIC PROCEDURE TODAY AT [facility]:   Refer to the procedure report that was given to you for any specific questions about what was found during the examination.  If the procedure report does not answer your questions, please call your gastroenterologist to clarify.  If you requested that your care partner not be given the details of your procedure findings, then the procedure report has been included in a sealed envelope for you to review at your convenience later.  YOU SHOULD EXPECT: Some feelings of bloating in the abdomen. Passage of more gas than usual.  Walking can help get rid of the air that was put into your GI tract during the procedure and reduce the bloating. If you had a lower endoscopy (such as a colonoscopy or flexible sigmoidoscopy) you may notice spotting of blood in your stool or on the toilet paper. If you underwent a bowel prep for your procedure, you may not have a normal bowel movement for a few days.  Please Note:  You might notice some irritation and congestion in your nose or some drainage.  This is from the oxygen used during your procedure.  There is no need for concern and it should clear up in a day or so.  SYMPTOMS TO REPORT IMMEDIATELY:   Following lower endoscopy (colonoscopy or flexible sigmoidoscopy):  Excessive amounts of blood in the stool  Significant tenderness or worsening of abdominal pains  Swelling of the abdomen that is new, acute  Fever of 100F or higher   For urgent or emergent issues, a gastroenterologist can be reached at any hour by calling [phone].   DIET:  We do recommend a small meal at first, but then you may proceed to your regular diet.  Drink plenty of fluids but you should avoid alcoholic beverages for 24 hours.  ACTIVITY:  You should plan to take it easy for the rest of today and you should NOT DRIVE or use heavy machinery until tomorrow (because of the sedation medicines used during the test).     FOLLOW UP: Our staff will call the number listed on your records the next business day following your procedure to check on you and address any questions or concerns that you may have regarding the information given to you following your procedure. If we do not reach you, we will leave a message.  However, if you are feeling well and you are not experiencing any problems, there is no need to return our call.  We will assume that you have returned to your regular daily activities without incident.  If any biopsies were taken you will be contacted by phone or by letter within the next 1-3 weeks.  Please call us at [phone] if you have not heard about the biopsies in 3 weeks.    SIGNATURES/CONFIDENTIALITY: You and/or your care partner have signed paperworkRepeat Colonoscopy screening in 5 years Diverticulosis (handout given)   which will be entered into your electronic medical record.  These signatures attest to the fact that that the information above on your After Visit Summary has been reviewed and is understood.  Full responsibility of the confidentiality of this discharge information lies with you and/or your care-partner.

## 2017-02-03 ENCOUNTER — Telehealth: Payer: Self-pay | Admitting: *Deleted

## 2017-02-03 NOTE — Telephone Encounter (Signed)
  Follow up Call-  Call back number 02/02/2017  Post procedure Call Back phone  # [phone]  Permission to leave phone message Yes  Some recent data might be hidden     Patient questions:  Do you have a fever, pain , or abdominal swelling? No. Pain Score  0 *  Have you tolerated food without any problems? Yes.    Have you been able to return to your normal activities? Yes.    Do you have any questions about your discharge instructions: Diet   No. Medications  No. Follow up visit  No.  Do you have questions or concerns about your Care? No.  Actions: * If pain score is 4 or above: No action needed, pain <4.

## 2017-02-10 ENCOUNTER — Other Ambulatory Visit: Payer: Self-pay | Admitting: *Deleted

## 2017-02-10 DIAGNOSIS — L502 Urticaria due to cold and heat: Secondary | ICD-10-CM

## 2017-02-10 DIAGNOSIS — C8313 Mantle cell lymphoma, intra-abdominal lymph nodes: Secondary | ICD-10-CM

## 2017-02-10 MED ORDER — AMOXICILLIN 500 MG PO CAPS: ORAL_CAPSULE | ORAL | 0 refills | Status: DC

## 2017-03-01 ENCOUNTER — Encounter: Payer: Self-pay | Admitting: Internal Medicine

## 2017-03-01 ENCOUNTER — Ambulatory Visit (INDEPENDENT_AMBULATORY_CARE_PROVIDER_SITE_OTHER): Payer: PPO | Admitting: Internal Medicine

## 2017-03-01 VITALS — BP 120/80 | HR 69 | Temp 97.7°F | Resp 16 | Ht 63.0 in | Wt 128.2 lb

## 2017-03-01 DIAGNOSIS — E559 Vitamin D deficiency, unspecified: Secondary | ICD-10-CM

## 2017-03-01 DIAGNOSIS — R0989 Other specified symptoms and signs involving the circulatory and respiratory systems: Secondary | ICD-10-CM

## 2017-03-01 DIAGNOSIS — E782 Mixed hyperlipidemia: Secondary | ICD-10-CM

## 2017-03-01 DIAGNOSIS — E039 Hypothyroidism, unspecified: Secondary | ICD-10-CM

## 2017-03-01 DIAGNOSIS — Z79899 Other long term (current) drug therapy: Secondary | ICD-10-CM

## 2017-03-01 DIAGNOSIS — R7303 Prediabetes: Secondary | ICD-10-CM | POA: Diagnosis not present

## 2017-03-01 LAB — BASIC METABOLIC PANEL WITH GFR
BUN: 16 mg/dL (ref 7–25)
CALCIUM: 9.4 mg/dL (ref 8.6–10.4)
CHLORIDE: 101 mmol/L (ref 98–110)
CO2: 27 mmol/L (ref 20–31)
Creat: 0.86 mg/dL (ref 0.60–0.93)
GFR, EST NON AFRICAN AMERICAN: 67 mL/min (ref >=60)
GFR, Est African American: 78 mL/min (ref >=60)
GLUCOSE: 91 mg/dL (ref 65–99)
POTASSIUM: 4.2 mmol/L (ref 3.5–5.3)
SODIUM: 136 mmol/L (ref 135–146)

## 2017-03-01 LAB — TSH: TSH: 2.11 mIU/L

## 2017-03-01 LAB — HEPATIC FUNCTION PANEL
ALK PHOS: 70 U/L (ref 33–130)
ALT: 13 U/L (ref 6–29)
AST: 22 U/L (ref 10–35)
Albumin: 4.2 g/dL (ref 3.6–5.1)
BILIRUBIN DIRECT: 0 mg/dL (ref <=0.2)
BILIRUBIN INDIRECT: 0.2 mg/dL (ref 0.2–1.2)
TOTAL PROTEIN: 6.1 g/dL (ref 6.1–8.1)
Total Bilirubin: 0.2 mg/dL (ref 0.2–1.2)

## 2017-03-01 LAB — LIPID PANEL
CHOL/HDL RATIO: 4.6 ratio (ref <5.0)
CHOLESTEROL: 216 mg/dL — AB (ref <200)
HDL: 47 mg/dL — ABNORMAL LOW (ref >50)
LDL CALC: 129 mg/dL — AB (ref <100)
Triglycerides: 200 mg/dL — ABNORMAL HIGH (ref <150)
VLDL: 40 mg/dL — AB (ref <30)

## 2017-03-01 NOTE — Progress Notes (Signed)
This very nice 76 y.o. MWF presents for 3 month follow up with Hypertension, Hyperlipidemia, Pre-Diabetes, Essential Hereditary Tremor  and Vitamin D Deficiency.      Patient was dx'd w/Mantle cell Lymphoma in Jan 2016 treated by Dr Marin Olp with Chemoradiation and by Sept 2016 , she was induced into remission & has since been on maintenance with Rituxan q70months. In Apr 2016 she was dx'd/tx'd for a Rt Leg DVT with Xarelto for 6 months.      Patient is followed expectantly for labile HTN & BP has been controlled at home. Today's BP is at goal - 120/80. Patient has had no complaints of any cardiac type chest pain, palpitations, dyspnea/orthopnea/PND, dizziness, claudication, or dependent edema.     Hyperlipidemia is not controlled with diet as she is reticent to taking meds for Chol. Last Lipids were not at goal: Lab Results  Component Value Date   CHOL 239 (H) 11/23/2016   HDL 51 11/23/2016   LDLCALC 160 (H) 11/23/2016   TRIG 138 11/23/2016   CHOLHDL 4.7 11/23/2016      Also, the patient has history of PreDiabetes (A1c 5.9% in 2012) and has had no symptoms of reactive hypoglycemia, diabetic polys, paresthesias or visual blurring.  Last A1c was at goal:  Lab Results  Component Value Date   HGBA1C 5.4 08/23/2016      Patient has been on thyroid replacement since 2013. Further, the patient also has history of Vitamin D Deficiency and supplements vitamin D without any suspected side-effects. Last vitamin D was  Still low (goal is 70-100): Lab Results  Component Value Date   VD25OH 54 11/23/2016   Current Outpatient Prescriptions on File Prior to Visit  Medication Sig  . acetaminophen  325 MG tablet Take  every 6  hrs as needed   . ALPRAZolam  0.5 MG tablet TAKE 1/2-1 TAB 3 x DAY AS NEEDED   . amoxicillin  500 MG capsule Take 4 pills one hour before dental procedure.  [name] VITAMIN D 2000 UNITS CAPS  alternates 1 cap with 2 caps every other day.  . hyoscyamine  0.125 MG tablet Take 1-2  tab every 4 hrs as needed   . levothyroxine  50 MCG tablet TAKE 1 TABLET BY MOUTH ONCE DAILY.  [name] EMLA crm Apply 1 application topically as needed.   . Probiotic Product  Take 1 cap daily.  [name] Respiratory Therapy Supplies (FLUTTER) DEVI directed   Allergies  Allergen Reactions  . Actonel [Risedronate Sodium] Other (See Comments)    "made me choke"   . Fosamax [Alendronate] Other (See Comments)    "made me choke"  . Sulfa Antibiotics Other (See Comments)    Doesn't remember   . Tetracyclines & Related Rash   PMHx:   Past Medical History:  Diagnosis Date  . Adenomatous colon polyp 1994  . Asthma   . Cataract 2013   bilateral eyes  . Diverticulosis   . Family history of ischemic heart disease   . Fibrocystic breast disease   . GERD (gastroesophageal reflux disease)   . Hiatal hernia   . Hyperlipidemia   . Hypertension   . IBS (irritable bowel syndrome)   . Internal hemorrhoids   . Mantle cell lymphoma ([place]) 10/18/2014  . Melanoma ([place])    Facial  . OSA (obstructive sleep apnea)   . Osteoarthritis   . Osteoporosis   . Prediabetes   . Sleep apnea   . Unspecified hypothyroidism   .  Vitamin D deficiency    Immunization History  Administered Date(s) Administered  . Pneumococcal Conjugate-13 03/18/2016  . Pneumococcal Polysaccharide-23 06/18/2013  . Zoster 04/17/2013   Past Surgical History:  Procedure Laterality Date  . APPENDECTOMY    . BUNIONECTOMY    . KNEE ARTHROSCOPY    . TONSILLECTOMY    . UMBILICAL HERNIA REPAIR     FHx:    Reviewed / unchanged  SHx:    Reviewed / unchanged  Systems Review:  Constitutional: Denies fever, chills, wt changes, headaches, insomnia, fatigue, night sweats, change in appetite. Eyes: Denies redness, blurred vision, diplopia, discharge, itchy, watery eyes.  ENT: Denies discharge, congestion, post nasal drip, epistaxis, sore throat, earache, hearing loss, dental pain, tinnitus, vertigo, sinus pain, snoring.  CV: Denies chest pain,  palpitations, irregular heartbeat, syncope, dyspnea, diaphoresis, orthopnea, PND, claudication or edema. Respiratory: denies cough, dyspnea, DOE, pleurisy, hoarseness, laryngitis, wheezing.  Gastrointestinal: Denies dysphagia, odynophagia, heartburn, reflux, water brash, abdominal pain or cramps, nausea, vomiting, bloating, diarrhea, constipation, hematemesis, melena, hematochezia  or hemorrhoids. Genitourinary: Denies dysuria, frequency, urgency, nocturia, hesitancy, discharge, hematuria or flank pain. Musculoskeletal: Denies arthralgias, myalgias, stiffness, jt. swelling, pain, limping or strain/sprain.  Skin: Denies pruritus, rash, hives, warts, acne, eczema or change in skin lesion(s). Neuro: No weakness, tremor, incoordination, spasms, paresthesia or pain. Psychiatric: Denies confusion, memory loss or sensory loss. Endo: Denies change in weight, skin or hair change.  Heme/Lymph: No excessive bleeding, bruising or enlarged lymph nodes.  Physical Exam  BP 120/80   Pulse 69   Temp 97.7 F (36.5 C)   Resp 16   Ht 5\' 3"  (1.6 m)   Wt 128 lb 3.2 oz (58.2 kg)   LMP 09/14/2014   BMI 22.71 kg/m   Appears well nourished, well groomed  and in no distress.  Eyes: PERRLA, EOMs, conjunctiva no swelling or erythema. Sinuses: No frontal/maxillary tenderness ENT/Mouth: EAC's clear, TM's nl w/o erythema, bulging. Nares clear w/o erythema, swelling, exudates. Oropharynx clear without erythema or exudates. Oral hygiene is good. Tongue normal, non obstructing. Hearing intact.  Neck: Supple. Thyroid nl. Car 2+/2+ without bruits, nodes or JVD. Chest: Respirations nl with BS clear & equal w/o rales, rhonchi, wheezing or stridor.  Cor: Heart sounds normal w/ regular rate and rhythm without sig. murmurs, gallops, clicks or rubs. Peripheral pulses normal and equal  without edema.  Abdomen: Soft & bowel sounds normal. Non-tender w/o guarding, rebound, hernias, masses or organomegaly.  Lymphatics:  Unremarkable.  Musculoskeletal: Full ROM all peripheral extremities, joint stability, 5/5 strength and normal gait.  Skin: Warm, dry without exposed rashes, lesions or ecchymosis apparent.  Neuro: Cranial nerves intact, reflexes equal bilaterally. Sensory-motor testing grossly intact. Tendon reflexes grossly intact.  Pysch: Alert & oriented x 3.  Insight and judgement nl & appropriate. No ideations.  Assessment and Plan:  1. Labile hypertension  - Continue medication, monitor blood pressure at home.  - Continue DASH diet. Reminder to go to the ER if any CP,  SOB, nausea, dizziness, severe HA, changes vision/speech,  left arm numbness and tingling and jaw pain. - CBC with Differential/Platelet - BASIC METABOLIC PANEL WITH GFR - Magnesium - TSH  2. Hyperlipidemia, mixed  - Continue diet/meds, exercise,& lifestyle modifications.  - Continue monitor periodic cholesterol/liver & renal functions  - Hepatic function panel - Lipid panel - TSH  3. Prediabetes  - Continue diet, exercise, lifestyle modifications.  - Monitor appropriate labs.  - Hemoglobin A1c - Insulin, random  4. Vitamin D deficiency  -  Continue supplementation.  - VITAMIN D 25 Hydroxy  5. Hypothyroidism  - TSH  6. Medication management  - CBC with Differential/Platelet - BASIC METABOLIC PANEL WITH GFR - Hepatic function panel - Magnesium - Lipid panel - TSH - Hemoglobin A1c - Insulin, random - VITAMIN D 25 Hydroxy       Discussed  regular exercise, BP monitoring, weight control to achieve/maintain BMI less than 25 and discussed med and SE's. Recommended labs to assess and monitor clinical status with further disposition pending results of labs. Over 30 minutes of exam, counseling, chart review was performed.

## 2017-03-01 NOTE — Patient Instructions (Addendum)

## 2017-03-02 LAB — VITAMIN D 25 HYDROXY (VIT D DEFICIENCY, FRACTURES): Vit D, 25-Hydroxy: 43 ng/mL (ref 30–100)

## 2017-03-02 LAB — CBC WITH DIFFERENTIAL/PLATELET
BASOS PCT: 2 %
Basophils Absolute: 134 cells/uL (ref 0–200)
EOS PCT: 2 %
Eosinophils Absolute: 134 cells/uL (ref 15–500)
HCT: 40.4 % (ref 35.0–45.0)
HEMOGLOBIN: 13.2 g/dL (ref 11.7–15.5)
LYMPHS ABS: 1139 {cells}/uL (ref 850–3900)
Lymphocytes Relative: 17 %
MCH: 29.2 pg (ref 27.0–33.0)
MCHC: 32.7 g/dL (ref 32.0–36.0)
MCV: 89.4 fL (ref 80.0–100.0)
MONO ABS: 469 {cells}/uL (ref 200–950)
MPV: 8.9 fL (ref 7.5–12.5)
Monocytes Relative: 7 %
NEUTROS ABS: 4824 {cells}/uL (ref 1500–7800)
NEUTROS PCT: 72 % — AB
Platelets: 221 10*3/uL (ref 140–400)
RBC: 4.52 MIL/uL (ref 3.80–5.10)
RDW: 14.4 % (ref 11.0–15.0)
WBC: 6.7 10*3/uL (ref 3.8–10.8)

## 2017-03-02 LAB — INSULIN, RANDOM: INSULIN: 10 u[IU]/mL (ref 2.0–19.6)

## 2017-03-02 LAB — MAGNESIUM: Magnesium: 1.9 mg/dL (ref 1.5–2.5)

## 2017-03-02 LAB — HEMOGLOBIN A1C
Hgb A1c MFr Bld: 5.6 % (ref <5.7)
MEAN PLASMA GLUCOSE: 114 mg/dL

## 2017-03-03 ENCOUNTER — Encounter: Payer: Self-pay | Admitting: *Deleted

## 2017-03-03 ENCOUNTER — Other Ambulatory Visit: Payer: Self-pay | Admitting: Internal Medicine

## 2017-03-03 DIAGNOSIS — N6489 Other specified disorders of breast: Secondary | ICD-10-CM

## 2017-03-11 ENCOUNTER — Other Ambulatory Visit (HOSPITAL_BASED_OUTPATIENT_CLINIC_OR_DEPARTMENT_OTHER): Payer: PPO

## 2017-03-11 ENCOUNTER — Ambulatory Visit (HOSPITAL_BASED_OUTPATIENT_CLINIC_OR_DEPARTMENT_OTHER): Payer: PPO

## 2017-03-11 ENCOUNTER — Ambulatory Visit (HOSPITAL_BASED_OUTPATIENT_CLINIC_OR_DEPARTMENT_OTHER): Payer: PPO | Admitting: Hematology & Oncology

## 2017-03-11 ENCOUNTER — Ambulatory Visit: Payer: PPO

## 2017-03-11 VITALS — BP 120/66 | HR 68 | Temp 97.9°F | Resp 16 | Wt 120.0 lb

## 2017-03-11 DIAGNOSIS — C8313 Mantle cell lymphoma, intra-abdominal lymph nodes: Secondary | ICD-10-CM

## 2017-03-11 DIAGNOSIS — D801 Nonfamilial hypogammaglobulinemia: Secondary | ICD-10-CM

## 2017-03-11 DIAGNOSIS — Z5112 Encounter for antineoplastic immunotherapy: Secondary | ICD-10-CM

## 2017-03-11 LAB — CMP (CANCER CENTER ONLY)
ALBUMIN: 3.5 g/dL (ref 3.3–5.5)
ALT(SGPT): 24 U/L (ref 10–47)
AST: 31 U/L (ref 11–38)
Alkaline Phosphatase: 61 U/L (ref 26–84)
BUN, Bld: 11 mg/dL (ref 7–22)
CALCIUM: 9.4 mg/dL (ref 8.0–10.3)
CHLORIDE: 106 meq/L (ref 98–108)
CO2: 28 meq/L (ref 18–33)
Creat: 0.7 mg/dl (ref 0.6–1.2)
GLUCOSE: 96 mg/dL (ref 73–118)
Potassium: 4.4 mEq/L (ref 3.3–4.7)
SODIUM: 140 meq/L (ref 128–145)
Total Bilirubin: 0.7 mg/dl (ref 0.20–1.60)
Total Protein: 6 g/dL — ABNORMAL LOW (ref 6.4–8.1)

## 2017-03-11 LAB — CBC WITH DIFFERENTIAL (CANCER CENTER ONLY)
BASO#: 0.1 10*3/uL (ref 0.0–0.2)
BASO%: 1.1 % (ref 0.0–2.0)
EOS ABS: 0.1 10*3/uL (ref 0.0–0.5)
EOS%: 2.2 % (ref 0.0–7.0)
HEMATOCRIT: 39.6 % (ref 34.8–46.6)
HGB: 13.2 g/dL (ref 11.6–15.9)
LYMPH#: 0.6 10*3/uL — ABNORMAL LOW (ref 0.9–3.3)
LYMPH%: 11.5 % — ABNORMAL LOW (ref 14.0–48.0)
MCH: 30.1 pg (ref 26.0–34.0)
MCHC: 33.3 g/dL (ref 32.0–36.0)
MCV: 90 fL (ref 81–101)
MONO#: 0.6 10*3/uL (ref 0.1–0.9)
MONO%: 11.7 % (ref 0.0–13.0)
NEUT#: 4 10*3/uL (ref 1.5–6.5)
NEUT%: 73.5 % (ref 39.6–80.0)
Platelets: 192 10*3/uL (ref 145–400)
RBC: 4.38 10*6/uL (ref 3.70–5.32)
RDW: 13.8 % (ref 11.1–15.7)
WBC: 5.5 10*3/uL (ref 3.9–10.0)

## 2017-03-11 LAB — TECHNOLOGIST REVIEW CHCC SATELLITE

## 2017-03-11 LAB — LACTATE DEHYDROGENASE: LDH: 221 U/L (ref 125–245)

## 2017-03-11 MED ORDER — HEPARIN SOD (PORK) LOCK FLUSH 100 UNIT/ML IV SOLN
500.0000 [IU] | Freq: Once | INTRAVENOUS | Status: AC | PRN
  Administered 2017-03-11: 500 [IU]
  Filled 2017-03-11: qty 5

## 2017-03-11 MED ORDER — SODIUM CHLORIDE 0.9 % IV SOLN
375.0000 mg/m2 | Freq: Once | INTRAVENOUS | Status: AC
  Administered 2017-03-11: 600 mg via INTRAVENOUS
  Filled 2017-03-11: qty 50

## 2017-03-11 MED ORDER — DIPHENHYDRAMINE HCL 25 MG PO CAPS
50.0000 mg | ORAL_CAPSULE | Freq: Once | ORAL | Status: AC
  Administered 2017-03-11: 50 mg via ORAL

## 2017-03-11 MED ORDER — DIPHENHYDRAMINE HCL 25 MG PO CAPS
ORAL_CAPSULE | ORAL | Status: AC
  Filled 2017-03-11: qty 2

## 2017-03-11 MED ORDER — SODIUM CHLORIDE 0.9 % IV SOLN
Freq: Once | INTRAVENOUS | Status: AC
  Administered 2017-03-11: 09:00:00 via INTRAVENOUS

## 2017-03-11 MED ORDER — ACETAMINOPHEN 325 MG PO TABS
ORAL_TABLET | ORAL | Status: AC
  Filled 2017-03-11: qty 2

## 2017-03-11 MED ORDER — ACETAMINOPHEN 325 MG PO TABS
650.0000 mg | ORAL_TABLET | Freq: Once | ORAL | Status: AC
  Administered 2017-03-11: 650 mg via ORAL

## 2017-03-11 MED ORDER — SODIUM CHLORIDE 0.9 % IJ SOLN
10.0000 mL | INTRAMUSCULAR | Status: DC | PRN
  Administered 2017-03-11: 10 mL
  Filled 2017-03-11: qty 10

## 2017-03-11 NOTE — Patient Instructions (Signed)

## 2017-03-11 NOTE — Patient Instructions (Signed)
Implanted Port Home Guide An implanted port is a type of central line that is placed under the skin. Central lines are used to provide IV access when treatment or nutrition needs to be given through a person's veins. Implanted ports are used for long-term IV access. An implanted port may be placed because:  You need IV medicine that would be irritating to the small veins in your hands or arms.  You need long-term IV medicines, such as antibiotics.  You need IV nutrition for a long period.  You need frequent blood draws for lab tests.  You need dialysis.  Implanted ports are usually placed in the chest area, but they can also be placed in the upper arm, the abdomen, or the leg. An implanted port has two main parts:  Reservoir. The reservoir is round and will appear as a small, raised area under your skin. The reservoir is the part where a needle is inserted to give medicines or draw blood.  Catheter. The catheter is a thin, flexible tube that extends from the reservoir. The catheter is placed into a large vein. Medicine that is inserted into the reservoir goes into the catheter and then into the vein.  How will I care for my incision site? Do not get the incision site wet. Bathe or shower as directed by your health care provider. How is my port accessed? Special steps must be taken to access the port:  Before the port is accessed, a numbing cream can be placed on the skin. This helps numb the skin over the port site.  Your health care provider uses a sterile technique to access the port. ? Your health care provider must put on a mask and sterile gloves. ? The skin over your port is cleaned carefully with an antiseptic and allowed to dry. ? The port is gently pinched between sterile gloves, and a needle is inserted into the port.  Only "non-coring" port needles should be used to access the port. Once the port is accessed, a blood return should be checked. This helps ensure that the port  is in the vein and is not clogged.  If your port needs to remain accessed for a constant infusion, a clear (transparent) bandage will be placed over the needle site. The bandage and needle will need to be changed every week, or as directed by your health care provider.  Keep the bandage covering the needle clean and dry. Do not get it wet. Follow your health care provider's instructions on how to take a shower or bath while the port is accessed.  If your port does not need to stay accessed, no bandage is needed over the port.  What is flushing? Flushing helps keep the port from getting clogged. Follow your health care provider's instructions on how and when to flush the port. Ports are usually flushed with saline solution or a medicine called heparin. The need for flushing will depend on how the port is used.  If the port is used for intermittent medicines or blood draws, the port will need to be flushed: ? After medicines have been given. ? After blood has been drawn. ? As part of routine maintenance.  If a constant infusion is running, the port may not need to be flushed.  How long will my port stay implanted? The port can stay in for as long as your health care provider thinks it is needed. When it is time for the port to come out, surgery will be   done to remove it. The procedure is similar to the one performed when the port was put in. When should I seek immediate medical care? When you have an implanted port, you should seek immediate medical care if:  You notice a bad smell coming from the incision site.  You have swelling, redness, or drainage at the incision site.  You have more swelling or pain at the port site or the surrounding area.  You have a fever that is not controlled with medicine.  This information is not intended to replace advice given to you by your health care provider. Make sure you discuss any questions you have with your health care provider. Document  Released: 08/30/2005 Document Revised: 02/05/2016 Document Reviewed: 05/07/2013 Elsevier Interactive Patient Education  2017 Elsevier Inc.  

## 2017-03-11 NOTE — Progress Notes (Signed)
Hematology and Oncology Follow Up Visit  Shannon Parsons [id number] 1941-02-17 76 y.o. 03/11/2017   Principle Diagnosis:   Mantle cell lymphoma (MCIPI = 5)  Thromboembolic event of the right leg  Acquired Hypogammaglobulinemia  Bronchiectasis  Current Therapy:    Rituxan - maintanence - s/p cycle #9 - to finish in 07/2017  IVIG - q 2 months - on hold     Interim History:  Shannon Parsons is back for follow-up. She is looking quite good.  She is doing okay. She still has an occasional bout of vomiting. This is unexpected. This is without warning. I don't that she has IBS. This might be a factor.  She has had no fever. She's had no weight loss. She's had no rashes. She's had no leg swelling.  There's been no bleeding or bruising.  She's had no headache.  There's been no cough or shortness of breath.   Overall, her performance status is ECOG 1.  Medications:  Current Outpatient Prescriptions:  .  acetaminophen (TYLENOL) 325 MG tablet, Take 325 mg by mouth every 6 (six) hours as needed (For pain.). , Disp: , Rfl:  .  ALPRAZolam (XANAX) 0.5 MG tablet, TAKE 1/2 TO 1 TABLET BY MOUTH 3 TIMES A DAY AS NEEDED FOR ANXIETY OR SLEEP., Disp: 90 tablet, Rfl: 5 .  amoxicillin (AMOXIL) 500 MG capsule, Take 4 pills one hour before dental procedure., Disp: 8 capsule, Rfl: 0 .  Cholecalciferol (VITAMIN D) 2000 UNITS CAPS, Take 2,000-4,000 capsules by mouth every other day. Patient alternates 1 cap with 2 caps every other day., Disp: , Rfl:  .  hyoscyamine (LEVSIN, ANASPAZ) 0.125 MG tablet, Take 1 to 2 tablets every 4 hours as needed for nausea, cramping, bloating or diarrhea, Disp: 90 tablet, Rfl: 11 .  levothyroxine (SYNTHROID, LEVOTHROID) 50 MCG tablet, TAKE 1 TABLET BY MOUTH ONCE DAILY., Disp: 90 tablet, Rfl: 3 .  lidocaine-prilocaine (EMLA) cream, Apply 1 application topically as needed. Apply to [facility] one hour before access., Disp: 30 g, Rfl: 3 .  ondansetron (ZOFRAN-ODT) 8 MG  disintegrating tablet, , Disp: , Rfl: 0 .  Probiotic Product (PROBIOTIC DAILY PO), Take 1 capsule by mouth daily., Disp: , Rfl:  .  Respiratory Therapy Supplies (FLUTTER) DEVI, Take as directed, Disp: 1 each, Rfl: 0 No current facility-administered medications for this visit.   Facility-Administered Medications Ordered in Other Visits:  .  heparin lock flush 100 unit/mL, 500 Units, Intracatheter, Once PRN, Ennever, Rudell Cobb, MD .  riTUXimab (RITUXAN) 600 mg in sodium chloride 0.9 % 190 mL chemo infusion, 375 mg/m2 (Treatment Plan Recorded), Intravenous, Once, Ennever, Peter R, MD .  sodium chloride 0.9 % injection 10 mL, 10 mL, Intracatheter, PRN, Volanda Napoleon, MD  Allergies:  Allergies  Allergen Reactions  . Actonel [Risedronate Sodium] Other (See Comments)    "made me choke"   . Fosamax [Alendronate] Other (See Comments)    "made me choke"  . Sulfa Antibiotics Other (See Comments)    Doesn't remember   . Tetracyclines & Related Rash    Past Medical History, Surgical history, Social history, and Family History were reviewed and updated.  Review of Systems: As above  Physical Exam:  weight is 120 lb (54.4 kg). Her oral temperature is 97.9 F (36.6 C). Her blood pressure is 120/66 and her pulse is 68. Her respiration is 16 and oxygen saturation is 100%.   Thin but fairly well-nourished white female. Head and neck exam shows no ocular or  oral lesions. She has no bilateral cervical or supraclavicular lymph nodes. She has no palpable thyroid. Lungs are clear. Cardiac exam regular rate and rhythm with no murmurs, rubs or bruits. Axillary exam shows no obvious bilateral axillary adenopathy. Abdomen is soft. She has decent bowel sounds. There is no fluid wave. Her spleen tip is just below the left costal margin. There is no hepatomegaly. Back exam shows no tenderness over the spine, ribs or hips. Extremities shows no clubbing, cyanosis or edema. No palpable venous cord is noted in the  right leg. She has good range of motion of her joints. She has good strength in her muscles. Neurological exam is nonfocal. Skin exam shows no rashes, ecchymoses or petechia  Lab Results  Component Value Date   WBC 5.5 03/11/2017   HGB 13.2 03/11/2017   HCT 39.6 03/11/2017   MCV 90 03/11/2017   PLT 192 03/11/2017     Chemistry      Component Value Date/Time   NA 140 03/11/2017 0756   NA 138 08/01/2015 1124   K 4.4 03/11/2017 0756   K 4.0 08/01/2015 1124   CL 106 03/11/2017 0756   CO2 28 03/11/2017 0756   CO2 28 08/01/2015 1124   BUN 11 03/11/2017 0756   BUN 10.4 08/01/2015 1124   CREATININE 0.7 03/11/2017 0756   CREATININE 0.8 08/01/2015 1124      Component Value Date/Time   CALCIUM 9.4 03/11/2017 0756   CALCIUM 9.6 08/01/2015 1124   ALKPHOS 61 03/11/2017 0756   ALKPHOS 88 08/01/2015 1124   AST 31 03/11/2017 0756   AST 23 08/01/2015 1124   ALT 24 03/11/2017 0756   ALT 12 08/01/2015 1124   BILITOT 0.70 03/11/2017 0756   BILITOT <0.30 08/01/2015 1124         Impression and Plan: Shannon Parsons is 76 year old white female. She has mantle cell lymphoma. Again, by the [facility] she is an intermediate risk.  She received 2 different courses of chemotherapy. She could not tolerate R-CHOP with Velcade substituting for vincristine. This however was incredibly helpful. We finished up with Rituxan/bendamustine. She completed this in August 2016.  We then started maintenance Rituxan. She started this in November 2016.  As such, she will finish 2 years of therapy in November 2018.  I don't see that we have to do any scans on her.  For now, I'll plan to get her back in 2 months.   Volanda Napoleon, MD 6/29/20189:29 AM

## 2017-04-01 DIAGNOSIS — L821 Other seborrheic keratosis: Secondary | ICD-10-CM | POA: Diagnosis not present

## 2017-04-01 DIAGNOSIS — L720 Epidermal cyst: Secondary | ICD-10-CM | POA: Diagnosis not present

## 2017-04-01 DIAGNOSIS — D224 Melanocytic nevi of scalp and neck: Secondary | ICD-10-CM | POA: Diagnosis not present

## 2017-04-01 DIAGNOSIS — L814 Other melanin hyperpigmentation: Secondary | ICD-10-CM | POA: Diagnosis not present

## 2017-04-01 DIAGNOSIS — Z85828 Personal history of other malignant neoplasm of skin: Secondary | ICD-10-CM | POA: Diagnosis not present

## 2017-04-01 DIAGNOSIS — D1801 Hemangioma of skin and subcutaneous tissue: Secondary | ICD-10-CM | POA: Diagnosis not present

## 2017-05-04 DIAGNOSIS — Z961 Presence of intraocular lens: Secondary | ICD-10-CM | POA: Diagnosis not present

## 2017-05-04 DIAGNOSIS — H26493 Other secondary cataract, bilateral: Secondary | ICD-10-CM | POA: Diagnosis not present

## 2017-05-04 DIAGNOSIS — H43813 Vitreous degeneration, bilateral: Secondary | ICD-10-CM | POA: Diagnosis not present

## 2017-05-10 ENCOUNTER — Ambulatory Visit: Payer: PPO

## 2017-05-10 ENCOUNTER — Ambulatory Visit (HOSPITAL_BASED_OUTPATIENT_CLINIC_OR_DEPARTMENT_OTHER): Payer: PPO | Admitting: Hematology & Oncology

## 2017-05-10 ENCOUNTER — Other Ambulatory Visit (HOSPITAL_BASED_OUTPATIENT_CLINIC_OR_DEPARTMENT_OTHER): Payer: PPO

## 2017-05-10 ENCOUNTER — Ambulatory Visit (HOSPITAL_BASED_OUTPATIENT_CLINIC_OR_DEPARTMENT_OTHER): Payer: PPO

## 2017-05-10 VITALS — BP 121/53 | HR 65 | Temp 98.1°F | Resp 18 | Wt 122.0 lb

## 2017-05-10 DIAGNOSIS — Z5112 Encounter for antineoplastic immunotherapy: Secondary | ICD-10-CM

## 2017-05-10 DIAGNOSIS — C8313 Mantle cell lymphoma, intra-abdominal lymph nodes: Secondary | ICD-10-CM

## 2017-05-10 DIAGNOSIS — D801 Nonfamilial hypogammaglobulinemia: Secondary | ICD-10-CM | POA: Diagnosis not present

## 2017-05-10 LAB — CBC WITH DIFFERENTIAL (CANCER CENTER ONLY)
BASO#: 0.1 10*3/uL (ref 0.0–0.2)
BASO%: 1.5 % (ref 0.0–2.0)
EOS%: 1.8 % (ref 0.0–7.0)
Eosinophils Absolute: 0.1 10*3/uL (ref 0.0–0.5)
HCT: 39.7 % (ref 34.8–46.6)
HGB: 13.6 g/dL (ref 11.6–15.9)
LYMPH#: 0.8 10*3/uL — ABNORMAL LOW (ref 0.9–3.3)
LYMPH%: 10.6 % — AB (ref 14.0–48.0)
MCH: 31.2 pg (ref 26.0–34.0)
MCHC: 34.3 g/dL (ref 32.0–36.0)
MCV: 91 fL (ref 81–101)
MONO#: 0.6 10*3/uL (ref 0.1–0.9)
MONO%: 8.9 % (ref 0.0–13.0)
NEUT#: 5.5 10*3/uL (ref 1.5–6.5)
NEUT%: 77.2 % (ref 39.6–80.0)
PLATELETS: 195 10*3/uL (ref 145–400)
RBC: 4.36 10*6/uL (ref 3.70–5.32)
RDW: 13.7 % (ref 11.1–15.7)
WBC: 7.2 10*3/uL (ref 3.9–10.0)

## 2017-05-10 LAB — CMP (CANCER CENTER ONLY)
ALK PHOS: 74 U/L (ref 26–84)
ALT: 20 U/L (ref 10–47)
AST: 35 U/L (ref 11–38)
Albumin: 4 g/dL (ref 3.3–5.5)
BUN: 12 mg/dL (ref 7–22)
CO2: 28 mEq/L (ref 18–33)
CREATININE: 1 mg/dL (ref 0.6–1.2)
Calcium: 9.5 mg/dL (ref 8.0–10.3)
Chloride: 106 mEq/L (ref 98–108)
GLUCOSE: 103 mg/dL (ref 73–118)
POTASSIUM: 4.1 meq/L (ref 3.3–4.7)
SODIUM: 143 meq/L (ref 128–145)
Total Bilirubin: 0.8 mg/dl (ref 0.20–1.60)
Total Protein: 6.6 g/dL (ref 6.4–8.1)

## 2017-05-10 LAB — LACTATE DEHYDROGENASE: LDH: 243 U/L (ref 125–245)

## 2017-05-10 LAB — TECHNOLOGIST REVIEW CHCC SATELLITE

## 2017-05-10 MED ORDER — ACETAMINOPHEN 325 MG PO TABS
650.0000 mg | ORAL_TABLET | Freq: Once | ORAL | Status: AC
  Administered 2017-05-10: 650 mg via ORAL

## 2017-05-10 MED ORDER — DIPHENHYDRAMINE HCL 25 MG PO CAPS
50.0000 mg | ORAL_CAPSULE | Freq: Once | ORAL | Status: AC
  Administered 2017-05-10: 50 mg via ORAL

## 2017-05-10 MED ORDER — DIPHENHYDRAMINE HCL 25 MG PO CAPS
ORAL_CAPSULE | ORAL | Status: AC
  Filled 2017-05-10: qty 2

## 2017-05-10 MED ORDER — SODIUM CHLORIDE 0.9 % IV SOLN
375.0000 mg/m2 | Freq: Once | INTRAVENOUS | Status: AC
  Administered 2017-05-10: 600 mg via INTRAVENOUS
  Filled 2017-05-10: qty 50

## 2017-05-10 MED ORDER — SODIUM CHLORIDE 0.9 % IJ SOLN
10.0000 mL | INTRAMUSCULAR | Status: DC | PRN
  Administered 2017-05-10: 10 mL
  Filled 2017-05-10: qty 10

## 2017-05-10 MED ORDER — HEPARIN SOD (PORK) LOCK FLUSH 100 UNIT/ML IV SOLN
500.0000 [IU] | Freq: Once | INTRAVENOUS | Status: AC | PRN
  Administered 2017-05-10: 500 [IU]
  Filled 2017-05-10: qty 5

## 2017-05-10 MED ORDER — SODIUM CHLORIDE 0.9 % IV SOLN
Freq: Once | INTRAVENOUS | Status: AC
  Administered 2017-05-10: 09:00:00 via INTRAVENOUS

## 2017-05-10 MED ORDER — ACETAMINOPHEN 325 MG PO TABS
ORAL_TABLET | ORAL | Status: AC
Start: 2017-05-10 — End: ?
  Filled 2017-05-10: qty 2

## 2017-05-10 NOTE — Patient Instructions (Signed)
[facility] Discharge Instructions for Patients Receiving Chemotherapy  Today you received the following chemotherapy agents:  Rituxan.  To help prevent nausea and vomiting after your treatment, we encourage you to take your nausea medication as ordered per MD.   If you develop nausea and vomiting that is not controlled by your nausea medication, call the clinic.   BELOW ARE SYMPTOMS THAT SHOULD BE REPORTED IMMEDIATELY:  *FEVER GREATER THAN 100.5 F  *CHILLS WITH OR WITHOUT FEVER  NAUSEA AND VOMITING THAT IS NOT CONTROLLED WITH YOUR NAUSEA MEDICATION  *UNUSUAL SHORTNESS OF BREATH  *UNUSUAL BRUISING OR BLEEDING  TENDERNESS IN MOUTH AND THROAT WITH OR WITHOUT PRESENCE OF ULCERS  *URINARY PROBLEMS  *BOWEL PROBLEMS  UNUSUAL RASH Items with * indicate a potential emergency and should be followed up as soon as possible.  Feel free to call the clinic you have any questions or concerns. The clinic phone number is [phone].  Please show the CHEMO ALERT CARD at check-in to the Emergency Department and triage nurse.   

## 2017-05-10 NOTE — Progress Notes (Signed)
Hematology and Oncology Follow Up Visit  Shannon Parsons [id number] [birth year] 76 y.o. 05/10/2017   Principle Diagnosis:   Mantle cell lymphoma (MCIPI = 5)  Thromboembolic event of the right leg  Acquired Hypogammaglobulinemia  Bronchiectasis  Current Therapy:    Rituxan - maintanence - s/p cycle #10 - to finish in 07/2017  IVIG - q 2 months - on hold     Interim History:  Shannon Parsons is back for follow-up. She is a little bit irritated today. She really wants to go sore medication. However, her husband is not able to.  Has had some ocular issues. He has visual problems. He has some macular degeneration. As such, he does not like to go anywhere.  She has been doing okay. She is walking quite a bit.  She's had no problems with fever. There is no cough. There's no nausea or vomiting. She's had no change in bowel or bladder habits. She's had no rashes. Has been no leg swelling.   In my opinion, she is done quite nicely with the Rituxan.   Currently, her performance status is ECOG 1.    Medications:  Current Outpatient Prescriptions:  .  acetaminophen (TYLENOL) 325 MG tablet, Take 325 mg by mouth every 6 (six) hours as needed (For pain.). , Disp: , Rfl:  .  ALPRAZolam (XANAX) 0.5 MG tablet, TAKE 1/2 TO 1 TABLET BY MOUTH 3 TIMES A DAY AS NEEDED FOR ANXIETY OR SLEEP., Disp: 90 tablet, Rfl: 5 .  amoxicillin (AMOXIL) 500 MG capsule, Take 4 pills one hour before dental procedure., Disp: 8 capsule, Rfl: 0 .  Cholecalciferol (VITAMIN D) 2000 UNITS CAPS, Take 2,000-4,000 capsules by mouth every other day. Patient alternates 1 cap with 2 caps every other day., Disp: , Rfl:  .  hyoscyamine (LEVSIN, ANASPAZ) 0.125 MG tablet, Take 1 to 2 tablets every 4 hours as needed for nausea, cramping, bloating or diarrhea, Disp: 90 tablet, Rfl: 11 .  levothyroxine (SYNTHROID, LEVOTHROID) 50 MCG tablet, TAKE 1 TABLET BY MOUTH ONCE DAILY., Disp: 90 tablet, Rfl: 3 .  lidocaine-prilocaine (EMLA)  cream, Apply 1 application topically as needed. Apply to [facility] one hour before access., Disp: 30 g, Rfl: 3 .  ondansetron (ZOFRAN-ODT) 8 MG disintegrating tablet, , Disp: , Rfl: 0 .  Probiotic Product (PROBIOTIC DAILY PO), Take 1 capsule by mouth daily., Disp: , Rfl:  .  Respiratory Therapy Supplies (FLUTTER) DEVI, Take as directed, Disp: 1 each, Rfl: 0  Allergies:  Allergies  Allergen Reactions  . Actonel [Risedronate Sodium] Other (See Comments)    "made me choke"   . Fosamax [Alendronate] Other (See Comments)    "made me choke"  . Sulfa Antibiotics Other (See Comments)    Doesn't remember   . Tetracyclines & Related Rash    Past Medical History, Surgical history, Social history, and Family History were reviewed and updated.  Review of Systems: As stated in the interim history  Physical Exam:  weight is 122 lb (55.3 kg). Her oral temperature is 98.1 F (36.7 C). Her blood pressure is 121/53 (abnormal) and her pulse is 65. Her respiration is 18 and oxygen saturation is 99%.   Physical Exam  Constitutional: She is oriented to person, place, and time.  HENT:  Head: Normocephalic and atraumatic.  Mouth/Throat: Oropharynx is clear and moist.  Eyes: Pupils are equal, round, and reactive to light. EOM are normal.  Neck: Normal range of motion.  Cardiovascular: Normal rate, regular rhythm and normal heart sounds.  Pulmonary/Chest: Effort normal and breath sounds normal.  Abdominal: Soft. Bowel sounds are normal.  Musculoskeletal: Normal range of motion. She exhibits no edema, tenderness or deformity.  Lymphadenopathy:    She has no cervical adenopathy.  Neurological: She is alert and oriented to person, place, and time.  Skin: Skin is warm and dry. No rash noted. No erythema.  Psychiatric: She has a normal mood and affect. Her behavior is normal. Judgment and thought content normal.  Vitals reviewed.    Lab Results  Component Value Date   WBC 7.2 05/10/2017   HGB 13.6  05/10/2017   HCT 39.7 05/10/2017   MCV 91 05/10/2017   PLT 195 05/10/2017     Chemistry      Component Value Date/Time   NA 143 05/10/2017 0743   NA 138 08/01/2015 1124   K 4.1 05/10/2017 0743   K 4.0 08/01/2015 1124   CL 106 05/10/2017 0743   CO2 28 05/10/2017 0743   CO2 28 08/01/2015 1124   BUN 12 05/10/2017 0743   BUN 10.4 08/01/2015 1124   CREATININE 1.0 05/10/2017 0743   CREATININE 0.8 08/01/2015 1124      Component Value Date/Time   CALCIUM 9.5 05/10/2017 0743   CALCIUM 9.6 08/01/2015 1124   ALKPHOS 74 05/10/2017 0743   ALKPHOS 88 08/01/2015 1124   AST 35 05/10/2017 0743   AST 23 08/01/2015 1124   ALT 20 05/10/2017 0743   ALT 12 08/01/2015 1124   BILITOT 0.80 05/10/2017 0743   BILITOT <0.30 08/01/2015 1124         Impression and Plan: Shannon Parsons is 76 year old white female. She has mantle cell lymphoma. Again, by the [facility] she is an intermediate risk.  She received 2 different courses of chemotherapy. She could not tolerate R-CHOP with Velcade substituting for vincristine. This however was incredibly helpful. We finished up with Rituxan/bendamustine. She completed this in August 2016.  We then started maintenance Rituxan. She started this in November 2016.  As such, she will finish 2 years of therapy in November 2018.  From my point of view, everything looks fantastic. I do not see any evidence of recurrent disease. I know that she is at risk for recurrence. We talked quite a bit about what we will do after her last Rituxan. I told her that studies have only looked at 2 years. There is no information about continuous Rituxan.  If her mantle cell comes back, then we will probably put her on Imbruvica, possibly combined with Rituxan or another agent.  I don't see that we have to do any scans on her.  For now, I'll plan to get her back in 2 months.   Volanda Napoleon, MD 8/28/20189:01 AM

## 2017-05-11 ENCOUNTER — Telehealth: Payer: Self-pay | Admitting: *Deleted

## 2017-05-11 LAB — VITAMIN D 25 HYDROXY (VIT D DEFICIENCY, FRACTURES): Vitamin D, 25-Hydroxy: 58.5 ng/mL (ref 30.0–100.0)

## 2017-05-11 NOTE — Telephone Encounter (Addendum)
Left message on personal voice mail  ----- Message from Volanda Napoleon, MD sent at 05/11/2017  6:17 AM EDT ----- Call - Vit D level is great!!!  Shannon Parsons

## 2017-05-18 ENCOUNTER — Other Ambulatory Visit: Payer: Self-pay | Admitting: Internal Medicine

## 2017-06-10 NOTE — Progress Notes (Signed)
3 month follow up  Assessment:    Essential hypertension - continue medications, DASH diet, exercise and monitor at home. Call if greater than 130/80.  -     Hepatic function panel -     BASIC METABOLIC PANEL WITH GFR -     TSH  Chronic obstructive airway disease with asthma (Shannon Parsons) Continue follow up pulmonary  Obstructive bronchiectasis (Shannon Parsons) Continue follow up pulmonary  Acute thromboembolism of deep veins of right lower extremity (Shannon Parsons) Likely due to chemo/cancer, will monitor for signs, stay on bASA  Mantle cell lymphoma of intra-abdominal lymph nodes (Shannon Parsons) Continue follow up oncology  Mixed hyperlipidemia -     Lipid panel  Medication management -     Magnesium  Acquired hypogammaglobulinemia (Shannon Parsons) Continue follow up oncology   Over 30 minutes of exam, counseling, chart review and critical decision making was performed Future Appointments Date Time Provider [place]  06/29/2017 1:30 PM [facility] DIAG [place] 2 [facility]  06/29/2017 1:40 PM [facility] [place] 2 [facility] CE  07/13/2017 7:45 AM [facility] LAB [facility] None  07/13/2017 8:00 AM [facility] A1 [facility] None  07/13/2017 8:30 AM Ennever, Shannon Cobb, MD [facility] None  07/13/2017 9:00 AM [facility] A1 [facility] None  09/15/2017 2:00 PM Shannon Pinto, MD [facility] None     Subjective:  Shannon Parsons is a 76 y.o. female who presents for 3 month follow up for HTN, chol, mantle cell lympoma.    Her blood pressure has been controlled at home, today their BP is BP: 120/70 She does not workout. She denies chest pain, shortness of breath, dizziness.  She has a history of Mantle cell lymphoma, thromboembolic right leg due to cancer/chemo, and bronchiectasis and acquired hypogammaglobulinemia, she continues to follow with Dr. Marin Parsons, and is doing very well. Her breathing is 100% better and her energy level is back to normal. She is having a hard time using her CPAP at night.  She is not on cholesterol  medication and denies myalgias. Her cholesterol is not at goal. The cholesterol last visit was:   Lab Results  Component Value Date   CHOL 216 (H) 03/01/2017   HDL 47 (L) 03/01/2017   LDLCALC 129 (H) 03/01/2017   TRIG 200 (H) 03/01/2017   CHOLHDL 4.6 03/01/2017   Last A1C Lab Results  Component Value Date   HGBA1C 5.6 03/01/2017   Last GFR: Lab Results  Component Value Date   GFRNONAA 67 03/01/2017   Patient is on Vitamin D supplement.   Lab Results  Component Value Date   VD25OH 58.5 05/10/2017     She is on thyroid medication. Her medication was not changed last visit.   Lab Results  Component Value Date   TSH 2.11 03/01/2017   BMI is Body mass index is 22.04 kg/m., she is working on diet and exercise. Wt Readings from Last 3 Encounters:  06/13/17 124 lb 6.4 oz (56.4 kg)  05/10/17 122 lb (55.3 kg)  03/11/17 120 lb (54.4 kg)     Medication Review: Current Outpatient Prescriptions on File Prior to Visit  Medication Sig Dispense Refill  . acetaminophen (TYLENOL) 325 MG tablet Take 325 mg by mouth every 6 (six) hours as needed (For pain.).     [name] ALPRAZolam (XANAX) 0.5 MG tablet TAKE 1/2 TO 1 TABLET BY MOUTH 3 TIMES A DAY AS NEEDED FOR ANXIETY OR SLEEP. 90 tablet 5  . Cholecalciferol (VITAMIN D) 2000 UNITS CAPS Take 2,000-4,000 capsules by mouth every other day. Patient  alternates 1 cap with 2 caps every other day.    . hyoscyamine (LEVSIN, ANASPAZ) 0.125 MG tablet Take 1 to 2 tablets every 4 hours as needed for nausea, cramping, bloating or diarrhea 90 tablet 11  . levothyroxine (SYNTHROID, LEVOTHROID) 50 MCG tablet TAKE 1 TABLET BY MOUTH ONCE DAILY. 90 tablet 4  . lidocaine-prilocaine (EMLA) cream Apply 1 application topically as needed. Apply to [facility] one hour before access. 30 g 3  . Probiotic Product (PROBIOTIC DAILY PO) Take 1 capsule by mouth daily.    [name] Respiratory Therapy Supplies (FLUTTER) DEVI Take as directed 1 each 0  . amoxicillin (AMOXIL) 500 MG capsule Take  4 pills one hour before dental procedure. (Patient not taking: Reported on 06/13/2017) 8 capsule 0   No current facility-administered medications on file prior to visit.     Allergies  Allergen Reactions  . Actonel [Risedronate Sodium] Other (See Comments)    "made me choke"   . Fosamax [Alendronate] Other (See Comments)    "made me choke"  . Sulfa Antibiotics Other (See Comments)    Doesn't remember   . Tetracyclines & Related Rash    Current Problems (verified) Patient Active Problem List   Diagnosis Date Noted  . Mantle cell lymphoma of intra-abdominal lymph nodes ([place]) 09/10/2016  . Acquired hypogammaglobulinemia ([place]) 04/05/2016  . Obstructive bronchiectasis ([place]) 03/18/2016  . Body mass index (BMI) of 20.0-20.9 in adult 07/21/2015  . Encounter for Medicare annual wellness exam 04/02/2015  . Leg DVT (deep venous thromboembolism), acute ([place])   . Normocytic anemia 01/04/2015  . Mantle cell lymphoma ([place]) 10/18/2014  . Essential tremor 07/15/2014  . Medication management 07/15/2014  . OSA (obstructive sleep apnea) 07/05/2014  . Anxiety state 02/27/2014  . Hyperlipidemia   . Hypertension   . GERD   . Osteoporosis  (Refuses treatment)   . Osteoarthritis   . Vitamin D deficiency   . Prediabetes   . IBS   . Diverticulosis   . History of colonic polyps 07/19/2011  . Chronic obstructive airway disease with asthma ([place]) 03/16/2011    Surgical History: reviewed and unchanged Family History: reviewed and unchanged Social History: reviewed and unchanged   Review of Systems  Constitutional: Positive for malaise/fatigue. Negative for chills and fever.  HENT: Negative for congestion, sore throat and tinnitus.   Eyes: Negative.   Respiratory: Negative for cough, shortness of breath and wheezing.   Cardiovascular: Negative for chest pain, palpitations and leg swelling.  Gastrointestinal: Negative for blood in stool and melena.  Genitourinary: Negative for hematuria.   Musculoskeletal: Positive for myalgias.  Skin: Negative.   Neurological: Negative for dizziness, sensory change, loss of consciousness and headaches.  Psychiatric/Behavioral: Negative for depression. The patient is not nervous/anxious and does not have insomnia.      Objective:     Today's Vitals   06/13/17 1420  BP: 120/70  Pulse: 69  Resp: 14  Temp: 97.9 F (36.6 C)  SpO2: 98%  Weight: 124 lb 6.4 oz (56.4 kg)  Height: 5\' 3"  (1.6 m)   Body mass index is 22.04 kg/m.  General appearance: alert, no distress, WD/WN, female HEENT: normocephalic, sclerae anicteric, TMs pearly, nares patent, no discharge or erythema, pharynx normal Oral cavity: MMM, no lesions Neck: supple, no lymphadenopathy, no thyromegaly, no masses Heart: RRR, normal S1, S2, no murmurs Lungs: CTA bilaterally, no wheezes, rhonchi, or rales Abdomen: +bs, soft, non tender, non distended, no masses, no hepatomegaly, no splenomegaly Musculoskeletal: nontender, no swelling, no obvious  deformity Extremities: no edema, no cyanosis, no clubbing Pulses: 2+ symmetric, upper and lower extremities, normal cap refill Neurological: alert, oriented x 3, CN2-12 intact, strength normal upper extremities and lower extremities, sensation normal throughout, DTRs 2+ throughout, no cerebellar signs, gait normal Psychiatric: normal affect, behavior normal, pleasant     Vicie Mutters, PA-C   06/13/2017

## 2017-06-13 ENCOUNTER — Encounter: Payer: Self-pay | Admitting: Physician Assistant

## 2017-06-13 ENCOUNTER — Ambulatory Visit (INDEPENDENT_AMBULATORY_CARE_PROVIDER_SITE_OTHER): Payer: PPO | Admitting: Physician Assistant

## 2017-06-13 VITALS — BP 120/70 | HR 69 | Temp 97.9°F | Resp 14 | Ht 63.0 in | Wt 124.4 lb

## 2017-06-13 DIAGNOSIS — D801 Nonfamilial hypogammaglobulinemia: Secondary | ICD-10-CM | POA: Diagnosis not present

## 2017-06-13 DIAGNOSIS — J449 Chronic obstructive pulmonary disease, unspecified: Secondary | ICD-10-CM | POA: Diagnosis not present

## 2017-06-13 DIAGNOSIS — C8313 Mantle cell lymphoma, intra-abdominal lymph nodes: Secondary | ICD-10-CM | POA: Diagnosis not present

## 2017-06-13 DIAGNOSIS — Z79899 Other long term (current) drug therapy: Secondary | ICD-10-CM | POA: Diagnosis not present

## 2017-06-13 DIAGNOSIS — E782 Mixed hyperlipidemia: Secondary | ICD-10-CM | POA: Diagnosis not present

## 2017-06-13 DIAGNOSIS — J479 Bronchiectasis, uncomplicated: Secondary | ICD-10-CM

## 2017-06-13 DIAGNOSIS — J4489 Other specified chronic obstructive pulmonary disease: Secondary | ICD-10-CM

## 2017-06-13 DIAGNOSIS — I10 Essential (primary) hypertension: Secondary | ICD-10-CM

## 2017-06-13 LAB — TSH: TSH: 1.45 mIU/L (ref 0.40–4.50)

## 2017-06-13 LAB — LIPID PANEL
CHOLESTEROL: 226 mg/dL — AB (ref <200)
HDL: 51 mg/dL (ref >50)
LDL Cholesterol (Calc): 136 mg/dL (calc) — ABNORMAL HIGH
Non-HDL Cholesterol (Calc): 175 mg/dL (calc) — ABNORMAL HIGH (ref <130)
Total CHOL/HDL Ratio: 4.4 (calc) (ref <5.0)
Triglycerides: 255 mg/dL — ABNORMAL HIGH (ref <150)

## 2017-06-13 NOTE — Patient Instructions (Signed)
Essential Tremor A tremor is trembling or shaking that you cannot control. Most tremors affect the hands or arms. Tremors can also affect the head, vocal cords, face, and other parts of the body. Essential tremor is a tremor without a known cause. What are the causes? Essential tremor has no known cause. What increases the risk? You may be at greater risk of essential tremor if:  You have a family member with essential tremor.  You are age 74 or older.  You take certain medicines.  What are the signs or symptoms? The main sign of a tremor is uncontrolled and unintentional rhythmic shaking of a body part.  You may have difficulty eating with a spoon or fork.  You may have difficulty writing.  You may nod your head up and down or side to side.  You may have a quivering voice.  Your tremors:  May get worse over time.  May come and go.  May be more noticeable on one side of your body.  May get worse due to stress, fatigue, caffeine, and extreme heat or cold.  How is this diagnosed? Your health care provider can diagnose essential tremor based on your symptoms, medical history, and a physical examination. There is no single test to diagnose an essential tremor. However, your health care provider may perform a variety of tests to rule out other conditions. Tests may include:  Blood and urine tests.  Imaging studies of your brain, such as: ? CT scan. ? MRI.  A test that measures involuntary muscle movement (electromyogram).  How is this treated? Your tremors may go away without treatment. Mild tremors may not need treatment if they do not affect your day-to-day life. Severe tremors may need to be treated using one or a combination of the following options:  Medicines. This may include medicine that is injected.  Lifestyle changes.  Physical therapy.  Follow these instructions at home:  Take medicines only as directed by your health care provider.  Limit alcohol  intake to no more than 1 drink per day for nonpregnant women and 2 drinks per day for men. One drink equals 12 oz of beer, 5 oz of wine, or 1 oz of hard liquor.  Do not use any tobacco products, including cigarettes, chewing tobacco, or electronic cigarettes. If you need help quitting, ask your health care provider.  Take medicines only as directed by your health care provider.  Avoid extreme heat or cold.  Limit the amount of caffeine you consumeas directed by your health care provider.  Try to get eight hours of sleep each night.  Find ways to manage your stress, such as meditation or yoga.  Keep all follow-up visits as directed by your health care provider. This is important. This includes any physical therapy visits. Contact a health care provider if:  You experience any changes in the location or intensity of your tremors.  You start having a tremor after starting a new medicine.  You have tremor with other symptoms such as: ? Numbness. ? Tingling. ? Pain. ? Weakness.  Your tremor gets worse.  Your tremor interferes with your daily life. This information is not intended to replace advice given to you by your health care provider. Make sure you discuss any questions you have with your health care provider. Document Released: 09/20/2014 Document Revised: 02/05/2016 Document Reviewed: 02/25/2014 Elsevier Interactive Patient Education  2018 Elsevier Inc.  

## 2017-06-14 NOTE — Progress Notes (Signed)
Pt aware of lab results & voiced understanding of those results.

## 2017-06-29 ENCOUNTER — Ambulatory Visit
Admission: RE | Admit: 2017-06-29 | Discharge: 2017-06-29 | Disposition: A | Discharge status: Home / Self Care | Payer: PPO | Source: Ambulatory Visit | Attending: Internal Medicine | Admitting: Internal Medicine

## 2017-06-29 ENCOUNTER — Ambulatory Visit: Payer: PPO

## 2017-06-29 DIAGNOSIS — R922 Inconclusive mammogram: Secondary | ICD-10-CM | POA: Diagnosis not present

## 2017-06-29 DIAGNOSIS — N6489 Other specified disorders of breast: Secondary | ICD-10-CM

## 2017-07-07 DIAGNOSIS — L249 Irritant contact dermatitis, unspecified cause: Secondary | ICD-10-CM | POA: Diagnosis not present

## 2017-07-13 ENCOUNTER — Ambulatory Visit (HOSPITAL_BASED_OUTPATIENT_CLINIC_OR_DEPARTMENT_OTHER): Payer: PPO | Admitting: Hematology & Oncology

## 2017-07-13 ENCOUNTER — Other Ambulatory Visit (HOSPITAL_BASED_OUTPATIENT_CLINIC_OR_DEPARTMENT_OTHER): Payer: PPO

## 2017-07-13 ENCOUNTER — Ambulatory Visit: Payer: PPO

## 2017-07-13 ENCOUNTER — Ambulatory Visit (HOSPITAL_BASED_OUTPATIENT_CLINIC_OR_DEPARTMENT_OTHER): Payer: PPO

## 2017-07-13 VITALS — BP 134/77 | HR 72 | Temp 98.1°F | Resp 18 | Wt 123.5 lb

## 2017-07-13 DIAGNOSIS — C8313 Mantle cell lymphoma, intra-abdominal lymph nodes: Secondary | ICD-10-CM

## 2017-07-13 DIAGNOSIS — Z5112 Encounter for antineoplastic immunotherapy: Secondary | ICD-10-CM

## 2017-07-13 DIAGNOSIS — M818 Other osteoporosis without current pathological fracture: Secondary | ICD-10-CM | POA: Diagnosis not present

## 2017-07-13 DIAGNOSIS — D801 Nonfamilial hypogammaglobulinemia: Secondary | ICD-10-CM

## 2017-07-13 LAB — CMP (CANCER CENTER ONLY)
ALBUMIN: 3.5 g/dL (ref 3.3–5.5)
ALK PHOS: 68 U/L (ref 26–84)
ALT(SGPT): 19 U/L (ref 10–47)
AST: 32 U/L (ref 11–38)
BUN, Bld: 16 mg/dL (ref 7–22)
CHLORIDE: 105 meq/L (ref 98–108)
CO2: 27 meq/L (ref 18–33)
Calcium: 9.5 mg/dL (ref 8.0–10.3)
Creat: 0.9 mg/dl (ref 0.6–1.2)
Glucose, Bld: 109 mg/dL (ref 73–118)
POTASSIUM: 4.2 meq/L (ref 3.3–4.7)
Sodium: 141 mEq/L (ref 128–145)
TOTAL PROTEIN: 6 g/dL — AB (ref 6.4–8.1)
Total Bilirubin: 0.5 mg/dl (ref 0.20–1.60)

## 2017-07-13 LAB — CBC WITH DIFFERENTIAL (CANCER CENTER ONLY)
BASO#: 0.1 10*3/uL (ref 0.0–0.2)
BASO%: 1.6 % (ref 0.0–2.0)
EOS ABS: 0.2 10*3/uL (ref 0.0–0.5)
EOS%: 2.2 % (ref 0.0–7.0)
HCT: 39 % (ref 34.8–46.6)
HGB: 13 g/dL (ref 11.6–15.9)
LYMPH#: 0.7 10*3/uL — ABNORMAL LOW (ref 0.9–3.3)
LYMPH%: 10.4 % — AB (ref 14.0–48.0)
MCH: 30.4 pg (ref 26.0–34.0)
MCHC: 33.3 g/dL (ref 32.0–36.0)
MCV: 91 fL (ref 81–101)
MONO#: 0.7 10*3/uL (ref 0.1–0.9)
MONO%: 9.7 % (ref 0.0–13.0)
NEUT#: 5.2 10*3/uL (ref 1.5–6.5)
NEUT%: 76.1 % (ref 39.6–80.0)
PLATELETS: 202 10*3/uL (ref 145–400)
RBC: 4.28 10*6/uL (ref 3.70–5.32)
RDW: 13.4 % (ref 11.1–15.7)
WBC: 6.8 10*3/uL (ref 3.9–10.0)

## 2017-07-13 LAB — LACTATE DEHYDROGENASE: LDH: 233 U/L (ref 125–245)

## 2017-07-13 LAB — TECHNOLOGIST REVIEW CHCC SATELLITE

## 2017-07-13 MED ORDER — DIPHENHYDRAMINE HCL 25 MG PO CAPS
ORAL_CAPSULE | ORAL | Status: AC
  Filled 2017-07-13: qty 2

## 2017-07-13 MED ORDER — SODIUM CHLORIDE 0.9 % IV SOLN
Freq: Once | INTRAVENOUS | Status: AC
  Administered 2017-07-13: 09:00:00 via INTRAVENOUS

## 2017-07-13 MED ORDER — RITUXIMAB CHEMO INJECTION 500 MG/50ML
375.0000 mg/m2 | Freq: Once | INTRAVENOUS | Status: AC
  Administered 2017-07-13: 600 mg via INTRAVENOUS
  Filled 2017-07-13: qty 10

## 2017-07-13 MED ORDER — ACETAMINOPHEN 325 MG PO TABS
650.0000 mg | ORAL_TABLET | Freq: Once | ORAL | Status: AC
  Administered 2017-07-13: 650 mg via ORAL

## 2017-07-13 MED ORDER — DIPHENHYDRAMINE HCL 25 MG PO CAPS
50.0000 mg | ORAL_CAPSULE | Freq: Once | ORAL | Status: AC
  Administered 2017-07-13: 50 mg via ORAL

## 2017-07-13 MED ORDER — ACETAMINOPHEN 325 MG PO TABS
ORAL_TABLET | ORAL | Status: AC
  Filled 2017-07-13: qty 2

## 2017-07-13 MED ORDER — SODIUM CHLORIDE 0.9 % IJ SOLN
10.0000 mL | INTRAMUSCULAR | Status: DC | PRN
  Administered 2017-07-13: 10 mL
  Filled 2017-07-13: qty 10

## 2017-07-13 MED ORDER — SODIUM CHLORIDE 0.9 % IJ SOLN
10.0000 mL | Freq: Once | INTRAMUSCULAR | Status: AC
  Administered 2017-07-13: 10 mL
  Filled 2017-07-13: qty 10

## 2017-07-13 MED ORDER — HEPARIN SOD (PORK) LOCK FLUSH 100 UNIT/ML IV SOLN
500.0000 [IU] | Freq: Once | INTRAVENOUS | Status: AC | PRN
  Administered 2017-07-13: 500 [IU]
  Filled 2017-07-13: qty 5

## 2017-07-13 NOTE — Progress Notes (Signed)
Hematology and Oncology Follow Up Visit  Shannon Parsons [id number] 1941-01-05 76 y.o. 07/13/2017   Principle Diagnosis:   Mantle cell lymphoma (MCIPI = 5)  Thromboembolic event of the right leg  Acquired Hypogammaglobulinemia  Bronchiectasis  Current Therapy:    Rituxan - maintanence - s/p cycle #10 - to finish in 07/2017  IVIG - q 2 months - on hold     Interim History:  [name] is back for follow-up. We saw her a couple months ago. She has been doing okay. She and her husband have yet to go on vacation.  She complains of her back hurting. She has osteoporosis. She refuses to date to take any intervention for this. I talked to her about this. I explained to her the importance of keeping her back healthy and strong and trying to avoid compression fractures which are very painful and can significantly limit her activity level. She understands this and is amenable to interventions.  She's had no cough. She's had no shortness of breath. She's had no bleeding. There's been no change in bowel or bladder habits. She's had no leg swelling. She's had no rashes.  Her appetite is doing pretty well. There is no nausea or vomiting.  Currently, her performance status is ECOG 1.    Medications:  Current Outpatient Prescriptions:  .  acetaminophen (TYLENOL) 325 MG tablet, Take 325 mg by mouth every 6 (six) hours as needed (For pain.). , Disp: , Rfl:  .  ALPRAZolam (XANAX) 0.5 MG tablet, TAKE 1/2 TO 1 TABLET BY MOUTH 3 TIMES A DAY AS NEEDED FOR ANXIETY OR SLEEP., Disp: 90 tablet, Rfl: 5 .  amoxicillin (AMOXIL) 500 MG capsule, Take 4 pills one hour before dental procedure., Disp: 8 capsule, Rfl: 0 .  Cholecalciferol (VITAMIN D) 2000 UNITS CAPS, Take 2,000-4,000 capsules by mouth every other day. Patient alternates 1 cap with 2 caps every other day., Disp: , Rfl:  .  hyoscyamine (LEVSIN, ANASPAZ) 0.125 MG tablet, Take 1 to 2 tablets every 4 hours as needed for nausea, cramping,  bloating or diarrhea, Disp: 90 tablet, Rfl: 11 .  levothyroxine (SYNTHROID, LEVOTHROID) 50 MCG tablet, TAKE 1 TABLET BY MOUTH ONCE DAILY., Disp: 90 tablet, Rfl: 4 .  lidocaine-prilocaine (EMLA) cream, Apply 1 application topically as needed. Apply to [facility] one hour before access., Disp: 30 g, Rfl: 3 .  Omega-3 Fatty Acids (FISH OIL) 1000 MG CAPS, Take by mouth daily., Disp: , Rfl:  .  Probiotic Product (PROBIOTIC DAILY PO), Take 1 capsule by mouth daily., Disp: , Rfl:  .  Respiratory Therapy Supplies (FLUTTER) DEVI, Take as directed, Disp: 1 each, Rfl: 0 No current facility-administered medications for this visit.   Facility-Administered Medications Ordered in Other Visits:  .  heparin lock flush 100 unit/mL, 500 Units, Intracatheter, Once PRN, Meyer Dockery, Rudell Cobb, MD .  riTUXimab (RITUXAN) 600 mg in sodium chloride 0.9 % 190 mL chemo infusion, 375 mg/m2 (Treatment Plan Recorded), Intravenous, Once, Joey Lierman R, MD .  sodium chloride 0.9 % injection 10 mL, 10 mL, Intracatheter, PRN, Volanda Napoleon, MD  Allergies:  Allergies  Allergen Reactions  . Actonel [Risedronate Sodium] Other (See Comments)    "made me choke"   . Fosamax [Alendronate] Other (See Comments)    "made me choke"  . Sulfa Antibiotics Other (See Comments)    Doesn't remember   . Tetracyclines & Related Rash    Past Medical History, Surgical history, Social history, and Family History were reviewed  and updated.  Review of Systems: As stated in the interim history  Physical Exam:  weight is 123 lb 8 oz (56 kg). Her oral temperature is 98.1 F (36.7 C). Her blood pressure is 134/77 and her pulse is 72. Her respiration is 18 and oxygen saturation is 100%.   Physical Exam  Constitutional: She is oriented to person, place, and time.  HENT:  Head: Normocephalic and atraumatic.  Mouth/Throat: Oropharynx is clear and moist.  Eyes: Pupils are equal, round, and reactive to light. EOM are normal.  Neck: Normal range of  motion.  Cardiovascular: Normal rate, regular rhythm and normal heart sounds.   Pulmonary/Chest: Effort normal and breath sounds normal.  Abdominal: Soft. Bowel sounds are normal.  Musculoskeletal: Normal range of motion. She exhibits no edema, tenderness or deformity.  Lymphadenopathy:    She has no cervical adenopathy.  Neurological: She is alert and oriented to person, place, and time.  Skin: Skin is warm and dry. No rash noted. No erythema.  Psychiatric: She has a normal mood and affect. Her behavior is normal. Judgment and thought content normal.  Vitals reviewed.    Lab Results  Component Value Date   WBC 6.8 07/13/2017   HGB 13.0 07/13/2017   HCT 39.0 07/13/2017   MCV 91 07/13/2017   PLT 202 07/13/2017     Chemistry      Component Value Date/Time   NA 141 07/13/2017 0743   NA 138 08/01/2015 1124   K 4.2 07/13/2017 0743   K 4.0 08/01/2015 1124   CL 105 07/13/2017 0743   CO2 27 07/13/2017 0743   CO2 28 08/01/2015 1124   BUN 16 07/13/2017 0743   BUN 10.4 08/01/2015 1124   CREATININE 0.9 07/13/2017 0743   CREATININE 0.8 08/01/2015 1124      Component Value Date/Time   CALCIUM 9.5 07/13/2017 0743   CALCIUM 9.6 08/01/2015 1124   ALKPHOS 68 07/13/2017 0743   ALKPHOS 88 08/01/2015 1124   AST 32 07/13/2017 0743   AST 23 08/01/2015 1124   ALT 19 07/13/2017 0743   ALT 12 08/01/2015 1124   BILITOT 0.50 07/13/2017 0743   BILITOT <0.30 08/01/2015 1124      Impression and Plan: Shannon Parsons is 76 year old white female. She has mantle cell lymphoma. Again, by the [facility] she is an intermediate risk.  She received 2 different courses of chemotherapy. She could not tolerate R-CHOP with Velcade substituting for vincristine. This however was incredibly helpful. We finished up with Rituxan/bendamustine. She completed this in August 2016.  We then started maintenance Rituxan. She started this in November 2016.  As such, she will finish 2 years of therapy in November 2018.  I  do not see any evidence of recurrent disease. Again she is at a higher risk of recurrence.  I did talk to her at length about treatment of osteoporosis. She clearly has osteoporotic bones in her spine. I told her that I am worried about her having compression fractures which would be very painful and would require intervention to help.  I think that Reclast would be a good idea for her. This is only done yearly. There should be a very good side effect profile. I told her that it takes about 30-45 minutes give. Some women to have some palpitations, fever, bone pain, sweats, fatigue. These are typically related to the rate of infusion. I'll like to give the Reclast relatively slowly.  She and her husband talked about this. She will agree to  try to. She wants to try when we see her back.  We will see her back in about 4 months now. She now has been  2 years since completing Rituxan.  I know that she does have a risk of recurrence. I don't think we need any scans on her right now.  I spent about 35 minutes with she and her husband. I answered her questions. She was very pleased with how much time I was able to take with her today.   Volanda Napoleon, MD 10/31/20189:48 AM

## 2017-07-13 NOTE — Patient Instructions (Signed)
[facility] Discharge Instructions for Patients Receiving Chemotherapy  Today you received the following chemotherapy agents:  Rituxan   To help prevent nausea and vomiting after your treatment, we encourage you to take your nausea medication as prescribed.   If you develop nausea and vomiting that is not controlled by your nausea medication, call the clinic.   BELOW ARE SYMPTOMS THAT SHOULD BE REPORTED IMMEDIATELY:  *FEVER GREATER THAN 100.5 F  *CHILLS WITH OR WITHOUT FEVER  NAUSEA AND VOMITING THAT IS NOT CONTROLLED WITH YOUR NAUSEA MEDICATION  *UNUSUAL SHORTNESS OF BREATH  *UNUSUAL BRUISING OR BLEEDING  TENDERNESS IN MOUTH AND THROAT WITH OR WITHOUT PRESENCE OF ULCERS  *URINARY PROBLEMS  *BOWEL PROBLEMS  UNUSUAL RASH Items with * indicate a potential emergency and should be followed up as soon as possible.  Feel free to call the clinic should you have any questions or concerns. The clinic phone number is [phone].  Please show the CHEMO ALERT CARD at check-in to the Emergency Department and triage nurse.   

## 2017-07-13 NOTE — Patient Instructions (Signed)
Implanted Port Home Guide An implanted port is a type of central line that is placed under the skin. Central lines are used to provide IV access when treatment or nutrition needs to be given through a person's veins. Implanted ports are used for long-term IV access. An implanted port may be placed because:  You need IV medicine that would be irritating to the small veins in your hands or arms.  You need long-term IV medicines, such as antibiotics.  You need IV nutrition for a long period.  You need frequent blood draws for lab tests.  You need dialysis.  Implanted ports are usually placed in the chest area, but they can also be placed in the upper arm, the abdomen, or the leg. An implanted port has two main parts:  Reservoir. The reservoir is round and will appear as a small, raised area under your skin. The reservoir is the part where a needle is inserted to give medicines or draw blood.  Catheter. The catheter is a thin, flexible tube that extends from the reservoir. The catheter is placed into a large vein. Medicine that is inserted into the reservoir goes into the catheter and then into the vein.  How will I care for my incision site? Do not get the incision site wet. Bathe or shower as directed by your health care provider. How is my port accessed? Special steps must be taken to access the port:  Before the port is accessed, a numbing cream can be placed on the skin. This helps numb the skin over the port site.  Your health care provider uses a sterile technique to access the port. ? Your health care provider must put on a mask and sterile gloves. ? The skin over your port is cleaned carefully with an antiseptic and allowed to dry. ? The port is gently pinched between sterile gloves, and a needle is inserted into the port.  Only "non-coring" port needles should be used to access the port. Once the port is accessed, a blood return should be checked. This helps ensure that the port  is in the vein and is not clogged.  If your port needs to remain accessed for a constant infusion, a clear (transparent) bandage will be placed over the needle site. The bandage and needle will need to be changed every week, or as directed by your health care provider.  Keep the bandage covering the needle clean and dry. Do not get it wet. Follow your health care provider's instructions on how to take a shower or bath while the port is accessed.  If your port does not need to stay accessed, no bandage is needed over the port.  What is flushing? Flushing helps keep the port from getting clogged. Follow your health care provider's instructions on how and when to flush the port. Ports are usually flushed with saline solution or a medicine called heparin. The need for flushing will depend on how the port is used.  If the port is used for intermittent medicines or blood draws, the port will need to be flushed: ? After medicines have been given. ? After blood has been drawn. ? As part of routine maintenance.  If a constant infusion is running, the port may not need to be flushed.  How long will my port stay implanted? The port can stay in for as long as your health care provider thinks it is needed. When it is time for the port to come out, surgery will be   done to remove it. The procedure is similar to the one performed when the port was put in. When should I seek immediate medical care? When you have an implanted port, you should seek immediate medical care if:  You notice a bad smell coming from the incision site.  You have swelling, redness, or drainage at the incision site.  You have more swelling or pain at the port site or the surrounding area.  You have a fever that is not controlled with medicine.  This information is not intended to replace advice given to you by your health care provider. Make sure you discuss any questions you have with your health care provider. Document  Released: 08/30/2005 Document Revised: 02/05/2016 Document Reviewed: 05/07/2013 Elsevier Interactive Patient Education  2017 Elsevier Inc.  

## 2017-07-21 ENCOUNTER — Encounter: Payer: Self-pay | Admitting: Physician Assistant

## 2017-07-21 ENCOUNTER — Ambulatory Visit: Payer: PPO | Admitting: Physician Assistant

## 2017-07-21 VITALS — BP 124/80 | Temp 98.6°F | Resp 16 | Ht 63.0 in | Wt 121.6 lb

## 2017-07-21 DIAGNOSIS — H6123 Impacted cerumen, bilateral: Secondary | ICD-10-CM | POA: Diagnosis not present

## 2017-07-21 DIAGNOSIS — H9203 Otalgia, bilateral: Secondary | ICD-10-CM | POA: Diagnosis not present

## 2017-07-21 DIAGNOSIS — J441 Chronic obstructive pulmonary disease with (acute) exacerbation: Secondary | ICD-10-CM | POA: Diagnosis not present

## 2017-07-21 MED ORDER — AZITHROMYCIN 250 MG PO TABS: ORAL_TABLET | ORAL | 1 refills | Status: AC

## 2017-07-21 MED ORDER — PREDNISONE 20 MG PO TABS: ORAL_TABLET | ORAL | 0 refills | Status: DC

## 2017-07-21 MED ORDER — ALBUTEROL SULFATE HFA 108 (90 BASE) MCG/ACT IN AERS
2.0000 | INHALATION_SPRAY | RESPIRATORY_TRACT | 0 refills | Status: DC | PRN
Start: 2017-07-21 — End: 2017-07-22

## 2017-07-21 NOTE — Patient Instructions (Addendum)
Get on allergy pill and dylseum cough syrup over the counter, get on prednisone, get on zpak.  Call if symptoms not better  Please pick one of the over the counter allergy medications below and take it once daily for allergies.  Claritin or loratadine cheapest but likely the weakest  Zyrtec or certizine at night because it can make you sleepy The strongest is allegra or fexafinadine  Cheapest at [employer], [employer], [employer]  Make sure you are on an allergy pill, see below for more details. Please take the prednisone as directed below, this is NOT an antibiotic so you do NOT have to finish it. You can take it for a few days and stop it if you are doing better.   Please take the prednisone to help decrease inflammation and therefore decrease symptoms. Take it it with food to avoid GI upset. It can cause increased energy but on the other hand it can make it hard to sleep at night so please take it AT [place], it takes 8-12 hours to start working so it will NOT affect your sleeping if you take it at night with your food!!  If you are diabetic it will increase your sugars so decrease carbs and monitor your sugars closely.     Use a dropper or use a cap to put peroxide, olive oil,mineral oil or canola oil in the effected ear- 2-3 times a week. Let it soak for 20-30 min then you can take a shower or use a baby bulb with warm water to wash out the ear wax.  Do not use Qtips

## 2017-07-21 NOTE — Progress Notes (Signed)
Subjective:    Patient ID: Shannon Parsons, female    DOB: 1941/04/02, 76 y.o.   MRN: [id number]  HPI 76 y.o. WF with history of HTN, COPD, asthma, diverticulosis, anxiety, being treated for Mantle cell lymphoma at cancer center with Ennever with IGF def presents with cough x 2 days. Has been taking tylenol, no allergy pills.    Blood pressure 124/80, temperature 98.6 F (37 C), resp. rate 16, height 5\' 3"  (1.6 m), weight 121 lb 9.6 oz (55.2 kg), last menstrual period 09/14/2014.  Medications Current Outpatient Medications on File Prior to Visit  Medication Sig  . acetaminophen (TYLENOL) 325 MG tablet Take 325 mg by mouth every 6 (six) hours as needed (For pain.).   [name] ALPRAZolam (XANAX) 0.5 MG tablet TAKE 1/2 TO 1 TABLET BY MOUTH 3 TIMES A DAY AS NEEDED FOR ANXIETY OR SLEEP.  [name] Cholecalciferol (VITAMIN D) 2000 UNITS CAPS Take 2,000-4,000 capsules by mouth every other day. Patient alternates 1 cap with 2 caps every other day.  . hyoscyamine (LEVSIN, ANASPAZ) 0.125 MG tablet Take 1 to 2 tablets every 4 hours as needed for nausea, cramping, bloating or diarrhea  . levothyroxine (SYNTHROID, LEVOTHROID) 50 MCG tablet TAKE 1 TABLET BY MOUTH ONCE DAILY.  [name] lidocaine-prilocaine (EMLA) cream Apply 1 application topically as needed. Apply to [facility] one hour before access.  . Omega-3 Fatty Acids (FISH OIL) 1000 MG CAPS Take by mouth daily.  . Probiotic Product (PROBIOTIC DAILY PO) Take 1 capsule by mouth daily.  [name] Respiratory Therapy Supplies (FLUTTER) DEVI Take as directed  . amoxicillin (AMOXIL) 500 MG capsule Take 4 pills one hour before dental procedure. (Patient not taking: Reported on 07/21/2017)   No current facility-administered medications on file prior to visit.     Problem list She has Chronic obstructive airway disease with asthma ([place]); History of colonic polyps; Hyperlipidemia; Hypertension; GERD; Osteoporosis  (Refuses treatment); Osteoarthritis; Vitamin D deficiency; Prediabetes; IBS;  Diverticulosis; Anxiety state; OSA (obstructive sleep apnea); Essential tremor; Medication management; Mantle cell lymphoma ([place]); Normocytic anemia; Leg DVT (deep venous thromboembolism), acute ([place]); Encounter for Medicare annual wellness exam; Body mass index (BMI) of 20.0-20.9 in adult; Obstructive bronchiectasis ([place]); Acquired hypogammaglobulinemia ([place]); and Mantle cell lymphoma of intra-abdominal lymph nodes ([place]) on their problem list.   Review of Systems  Constitutional: Positive for fatigue. Negative for chills, diaphoresis and fever.  HENT: Positive for congestion, ear pain, postnasal drip, sinus pressure, sneezing and sore throat.   Respiratory: Positive for cough and chest tightness. Negative for apnea, shortness of breath and wheezing.   Cardiovascular: Negative.  Negative for chest pain, palpitations and leg swelling.  Gastrointestinal: Negative.   Genitourinary: Negative.   Musculoskeletal: Negative for neck pain.  Neurological: Positive for headaches.       Objective:   Physical Exam  Constitutional: She is oriented to person, place, and time. She appears well-developed and well-nourished.  HENT:  Right Ear: Hearing normal. No mastoid tenderness. Tympanic membrane is not injected, not perforated, not erythematous, not retracted and not bulging. No middle ear effusion.  Left Ear: Hearing normal. No mastoid tenderness. Tympanic membrane is not injected, not perforated, not erythematous, not retracted and not bulging.  No middle ear effusion.  Nose: Right sinus exhibits maxillary sinus tenderness. Left sinus exhibits maxillary sinus tenderness.  Mouth/Throat: Uvula is midline, oropharynx is clear and moist and mucous membranes are normal.  Bilateral cerumen impaction  Eyes: Conjunctivae and EOM are normal. Pupils are equal, round, and reactive to  light.  Neck: Neck supple.  Cardiovascular: Normal rate and regular rhythm.  Pulmonary/Chest: Effort normal. No respiratory  distress. She has no decreased breath sounds. She has wheezes. She has no rhonchi. She has no rales.  Abdominal: Soft. Bowel sounds are normal.  Musculoskeletal: Normal range of motion.  Lymphadenopathy:    She has no cervical adenopathy.  Neurological: She is alert and oriented to person, place, and time.  Skin: Skin is warm and dry.       Assessment & Plan:    COPD exacerbation ([place]) -     azithromycin (ZITHROMAX) 250 MG tablet; Take 2 tablets (500 mg) on  Day 1,  followed by 1 tablet (250 mg) once daily on Days 2 through 5. -     predniSONE (DELTASONE) 20 MG tablet; 2 tablets daily for 3 days, 1 tablet daily for 4 days. -     albuterol (VENTOLIN HFA) 108 (90 Base) MCG/ACT inhaler; Inhale 2 puffs every 4 (four) hours as needed into the lungs for wheezing or shortness of breath.  Excessive cerumen in ear canal, bilateral Ear pain, bilateral Cerumen impaction - stop using Qtips, irrigation used in the office without complications, use OTC drops/oil at home to prevent reoccurence

## 2017-07-22 ENCOUNTER — Other Ambulatory Visit: Payer: Self-pay | Admitting: Physician Assistant

## 2017-07-22 DIAGNOSIS — J441 Chronic obstructive pulmonary disease with (acute) exacerbation: Secondary | ICD-10-CM

## 2017-07-27 ENCOUNTER — Ambulatory Visit: Payer: PPO | Admitting: Physician Assistant

## 2017-07-27 ENCOUNTER — Encounter: Payer: Self-pay | Admitting: Physician Assistant

## 2017-07-27 VITALS — BP 122/74 | HR 90 | Temp 97.3°F | Resp 14 | Ht 63.0 in | Wt 121.6 lb

## 2017-07-27 DIAGNOSIS — H6123 Impacted cerumen, bilateral: Secondary | ICD-10-CM

## 2017-07-27 NOTE — Progress Notes (Signed)
Subjective:    Patient ID: Shannon Parsons, female    DOB: 1940/10/15, 76 y.o.   MRN: [id number]  HPI 76 y.o. WF with history of HTN, COPD, asthma, diverticulosis, anxiety, being treated for Mantle cell lymphoma at cancer center with Ennever with IGF def presents for follow up for COPD and cerumen impaction. Unable to removal cerumen from ears last OV, has been doing oil/peroxidi and will check today.   Blood pressure 122/74, pulse 90, temperature (!) 97.3 F (36.3 C), resp. rate 14, height 5\' 3"  (1.6 m), weight 121 lb 9.6 oz (55.2 kg), last menstrual period 09/14/2014, SpO2 96 %.  Medications Current Outpatient Medications on File Prior to Visit  Medication Sig  . acetaminophen (TYLENOL) 325 MG tablet Take 325 mg by mouth every 6 (six) hours as needed (For pain.).   [name] ALPRAZolam (XANAX) 0.5 MG tablet TAKE 1/2 TO 1 TABLET BY MOUTH 3 TIMES A DAY AS NEEDED FOR ANXIETY OR SLEEP.  [name] Cholecalciferol (VITAMIN D) 2000 UNITS CAPS Take 2,000-4,000 capsules by mouth every other day. Patient alternates 1 cap with 2 caps every other day.  . hyoscyamine (LEVSIN, ANASPAZ) 0.125 MG tablet Take 1 to 2 tablets every 4 hours as needed for nausea, cramping, bloating or diarrhea  . levothyroxine (SYNTHROID, LEVOTHROID) 50 MCG tablet TAKE 1 TABLET BY MOUTH ONCE DAILY.  [name] lidocaine-prilocaine (EMLA) cream Apply 1 application topically as needed. Apply to [facility] one hour before access.  . Omega-3 Fatty Acids (FISH OIL) 1000 MG CAPS Take by mouth daily.  . predniSONE (DELTASONE) 20 MG tablet 2 tablets daily for 3 days, 1 tablet daily for 4 days.  [name] PROAIR HFA 108 (90 Base) MCG/ACT inhaler INHALE 2 PUFFS EVERY 4 HOURS AS NEEDED INTO THE LUNGS FOR WHEEZING OR SHORTNESS OF BREATH.  . Probiotic Product (PROBIOTIC DAILY PO) Take 1 capsule by mouth daily.  [name] Respiratory Therapy Supplies (FLUTTER) DEVI Take as directed  . amoxicillin (AMOXIL) 500 MG capsule Take 4 pills one hour before dental procedure. (Patient not  taking: Reported on 07/27/2017)   No current facility-administered medications on file prior to visit.     Problem list She has Chronic obstructive airway disease with asthma ([place]); History of colonic polyps; Hyperlipidemia; Hypertension; GERD; Osteoporosis  (Refuses treatment); Osteoarthritis; Vitamin D deficiency; Prediabetes; IBS; Diverticulosis; Anxiety state; OSA (obstructive sleep apnea); Essential tremor; Medication management; Mantle cell lymphoma ([place]); Normocytic anemia; Leg DVT (deep venous thromboembolism), acute ([place]); Encounter for Medicare annual wellness exam; Body mass index (BMI) of 20.0-20.9 in adult; Obstructive bronchiectasis ([place]); Acquired hypogammaglobulinemia ([place]); and Mantle cell lymphoma of intra-abdominal lymph nodes ([place]) on their problem list.   Review of Systems  Constitutional: Positive for fatigue. Negative for chills, diaphoresis and fever.  HENT: Positive for congestion, ear pain, postnasal drip, sinus pressure, sneezing and sore throat.   Respiratory: Positive for cough and chest tightness. Negative for apnea, shortness of breath and wheezing.   Cardiovascular: Negative.  Negative for chest pain, palpitations and leg swelling.  Gastrointestinal: Negative.   Genitourinary: Negative.   Musculoskeletal: Negative for neck pain.  Neurological: Positive for headaches.       Objective:   Physical Exam  Constitutional: She is oriented to person, place, and time. She appears well-developed and well-nourished.  HENT:  Right Ear: Hearing normal. No mastoid tenderness. Tympanic membrane is not injected, not perforated, not erythematous, not retracted and not bulging. No middle ear effusion.  Left Ear: Hearing normal. No mastoid tenderness. Tympanic membrane is not  injected, not perforated, not erythematous, not retracted and not bulging.  No middle ear effusion.  Nose: Right sinus exhibits maxillary sinus tenderness. Left sinus exhibits maxillary sinus tenderness.   Mouth/Throat: Uvula is midline, oropharynx is clear and moist and mucous membranes are normal.  Bilateral cerumen impaction  Eyes: Conjunctivae and EOM are normal. Pupils are equal, round, and reactive to light.  Neck: Neck supple.  Cardiovascular: Normal rate and regular rhythm.  Pulmonary/Chest: Effort normal. No respiratory distress. She has no decreased breath sounds. She has wheezes. She has no rhonchi. She has no rales.  Abdominal: Soft. Bowel sounds are normal.  Musculoskeletal: Normal range of motion.  Lymphadenopathy:    She has no cervical adenopathy.  Neurological: She is alert and oriented to person, place, and time.  Skin: Skin is warm and dry.       Assessment & Plan:    COPD exacerbation ([place]) -     improved.  Excessive cerumen in ear canal, bilateral Ear pain, bilateral Cerumen impaction - has been putting drops in ear, here for removal.   No charge since suppose to be done last OV

## 2017-07-28 ENCOUNTER — Ambulatory Visit: Payer: Self-pay | Admitting: Physician Assistant

## 2017-08-03 ENCOUNTER — Encounter: Payer: Self-pay | Admitting: Physician Assistant

## 2017-08-03 ENCOUNTER — Ambulatory Visit (INDEPENDENT_AMBULATORY_CARE_PROVIDER_SITE_OTHER): Payer: PPO | Admitting: Physician Assistant

## 2017-08-03 VITALS — BP 118/76 | HR 80 | Temp 97.9°F | Ht 63.0 in | Wt 122.0 lb

## 2017-08-03 DIAGNOSIS — R05 Cough: Secondary | ICD-10-CM

## 2017-08-03 DIAGNOSIS — R059 Cough, unspecified: Secondary | ICD-10-CM

## 2017-08-03 MED ORDER — AMOXICILLIN-POT CLAVULANATE 875-125 MG PO TABS: 1.0000 | ORAL_TABLET | Freq: Two times a day (BID) | ORAL | 0 refills | Status: AC

## 2017-08-03 NOTE — Progress Notes (Signed)
Subjective:    Patient ID: Shannon Parsons, female    DOB: Jan 08, 1941, 76 y.o.   MRN: [id number]  HPI 76 y.o. WF with history of HTN, COPD, asthma, diverticulosis, anxiety, being treated for Mantle cell lymphoma at cancer center with Ennever with IGF def presents cough, she is feeling better but still has fatigue, coughing a lot of mucus green. No fever, chills. She was seen 11/08 for COPD exacerbation and given zpak and prednisone.  Has been doing nebulizer at home, just once.   Blood pressure 118/76, pulse 80, temperature 97.9 F (36.6 C), height 5\' 3"  (1.6 m), weight 122 lb (55.3 kg), last menstrual period 09/14/2014, SpO2 97 %.  Medications Current Outpatient Medications on File Prior to Visit  Medication Sig  . acetaminophen (TYLENOL) 325 MG tablet Take 325 mg by mouth every 6 (six) hours as needed (For pain.).   [name] ALPRAZolam (XANAX) 0.5 MG tablet TAKE 1/2 TO 1 TABLET BY MOUTH 3 TIMES A DAY AS NEEDED FOR ANXIETY OR SLEEP.  [name] amoxicillin (AMOXIL) 500 MG capsule Take 4 pills one hour before dental procedure.  . Cholecalciferol (VITAMIN D) 2000 UNITS CAPS Take 2,000-4,000 capsules by mouth every other day. Patient alternates 1 cap with 2 caps every other day.  . hyoscyamine (LEVSIN, ANASPAZ) 0.125 MG tablet Take 1 to 2 tablets every 4 hours as needed for nausea, cramping, bloating or diarrhea  . levothyroxine (SYNTHROID, LEVOTHROID) 50 MCG tablet TAKE 1 TABLET BY MOUTH ONCE DAILY.  [name] lidocaine-prilocaine (EMLA) cream Apply 1 application topically as needed. Apply to [facility] one hour before access.  . Omega-3 Fatty Acids (FISH OIL) 1000 MG CAPS Take by mouth daily.  [name] PROAIR HFA 108 (90 Base) MCG/ACT inhaler INHALE 2 PUFFS EVERY 4 HOURS AS NEEDED INTO THE LUNGS FOR WHEEZING OR SHORTNESS OF BREATH.  . Probiotic Product (PROBIOTIC DAILY PO) Take 1 capsule by mouth daily.  [name] Respiratory Therapy Supplies (FLUTTER) DEVI Take as directed  . predniSONE (DELTASONE) 20 MG tablet 2 tablets daily for 3  days, 1 tablet daily for 4 days. (Patient not taking: Reported on 08/03/2017)   No current facility-administered medications on file prior to visit.     Problem list She has Chronic obstructive airway disease with asthma ([place]); History of colonic polyps; Hyperlipidemia; Hypertension; GERD; Osteoporosis  (Refuses treatment); Osteoarthritis; Vitamin D deficiency; Prediabetes; IBS; Diverticulosis; Anxiety state; OSA (obstructive sleep apnea); Essential tremor; Medication management; Mantle cell lymphoma ([place]); Normocytic anemia; Leg DVT (deep venous thromboembolism), acute ([place]); Encounter for Medicare annual wellness exam; Body mass index (BMI) of 20.0-20.9 in adult; Obstructive bronchiectasis ([place]); Acquired hypogammaglobulinemia ([place]); and Mantle cell lymphoma of intra-abdominal lymph nodes ([place]) on their problem list.   Review of Systems  Constitutional: Positive for fatigue. Negative for chills, diaphoresis and fever.  HENT: Positive for congestion, postnasal drip, sinus pressure, sneezing and sore throat. Negative for ear pain.   Respiratory: Positive for cough and chest tightness. Negative for apnea, shortness of breath and wheezing.   Cardiovascular: Negative.  Negative for chest pain, palpitations and leg swelling.  Gastrointestinal: Negative.   Genitourinary: Negative.   Musculoskeletal: Negative for neck pain.  Neurological: Positive for headaches.       Objective:   Physical Exam  Constitutional: She is oriented to person, place, and time. She appears well-developed and well-nourished.  HENT:  Right Ear: Hearing normal. No mastoid tenderness. Tympanic membrane is not injected, not perforated, not erythematous, not retracted and not bulging. No middle ear effusion.  Left Ear: Hearing normal. No mastoid tenderness. Tympanic membrane is not injected, not perforated, not erythematous, not retracted and not bulging.  No middle ear effusion.  Nose: Right sinus exhibits maxillary sinus  tenderness. Left sinus exhibits maxillary sinus tenderness.  Mouth/Throat: Uvula is midline, oropharynx is clear and moist and mucous membranes are normal.  Eyes: Conjunctivae and EOM are normal. Pupils are equal, round, and reactive to light.  Neck: Neck supple.  Cardiovascular: Normal rate and regular rhythm.  Pulmonary/Chest: Effort normal. No respiratory distress. She has no decreased breath sounds. She has wheezes. She has no rhonchi. She has no rales.  Abdominal: Soft. Bowel sounds are normal.  Musculoskeletal: Normal range of motion.  Lymphadenopathy:    She has no cervical adenopathy.  Neurological: She is alert and oriented to person, place, and time.  Skin: Skin is warm and dry.       Assessment & Plan:   Cough Will treat for typical infection If not better get CXR If worse go to ER -     amoxicillin-clavulanate (AUGMENTIN) 875-125 MG tablet; Take 1 tablet by mouth 2 (two) times daily for 7 days.

## 2017-08-03 NOTE — Patient Instructions (Signed)
Get on augmentin twice a day Take nebulizer twice a day Get on cough syrup Any shortness of breath, chest pain go to ER   Acute Bronchitis, Adult Acute bronchitis is sudden (acute) swelling of the air tubes (bronchi) in the lungs. Acute bronchitis causes these tubes to fill with mucus, which can make it hard to breathe. It can also cause coughing or wheezing. In adults, acute bronchitis usually goes away within 2 weeks. A cough caused by bronchitis may last up to 3 weeks. Smoking, allergies, and asthma can make the condition worse. Repeated episodes of bronchitis may cause further lung problems, such as chronic obstructive pulmonary disease (COPD). What are the causes? This condition can be caused by germs and by substances that irritate the lungs, including:  Cold and flu viruses. This condition is most often caused by the same virus that causes a cold.  Bacteria.  Exposure to tobacco smoke, dust, fumes, and air pollution.  What increases the risk? This condition is more likely to develop in people who:  Have close contact with someone with acute bronchitis.  Are exposed to lung irritants, such as tobacco smoke, dust, fumes, and vapors.  Have a weak immune system.  Have a respiratory condition such as asthma.  What are the signs or symptoms? Symptoms of this condition include:  A cough.  Coughing up clear, yellow, or green mucus.  Wheezing.  Chest congestion.  Shortness of breath.  A fever.  Body aches.  Chills.  A sore throat.  How is this diagnosed? This condition is usually diagnosed with a physical exam. During the exam, your health care provider may order tests, such as chest X-rays, to rule out other conditions. He or she may also:  Test a sample of your mucus for bacterial infection.  Check the level of oxygen in your blood. This is done to check for pneumonia.  Do a chest X-ray or lung function testing to rule out pneumonia and other  conditions.  Perform blood tests.  Your health care provider will also ask about your symptoms and medical history. How is this treated? Most cases of acute bronchitis clear up over time without treatment. Your health care provider may recommend:  Drinking more fluids. Drinking more makes your mucus thinner, which may make it easier to breathe.  Taking a medicine for a fever or cough.  Taking an antibiotic medicine.  Using an inhaler to help improve shortness of breath and to control a cough.  Using a cool mist vaporizer or humidifier to make it easier to breathe.  Follow these instructions at home: Medicines  Take over-the-counter and prescription medicines only as told by your health care provider.  If you were prescribed an antibiotic, take it as told by your health care provider. Do not stop taking the antibiotic even if you start to feel better. General instructions  Get plenty of rest.  Drink enough fluids to keep your urine clear or pale yellow.  Avoid smoking and secondhand smoke. Exposure to cigarette smoke or irritating chemicals will make bronchitis worse. If you smoke and you need help quitting, ask your health care provider. Quitting smoking will help your lungs heal faster.  Use an inhaler, cool mist vaporizer, or humidifier as told by your health care provider.  Keep all follow-up visits as told by your health care provider. This is important. How is this prevented? To lower your risk of getting this condition again:  Wash your hands often with soap and water. If soap  and water are not available, use hand sanitizer.  Avoid contact with people who have cold symptoms.  Try not to touch your hands to your mouth, nose, or eyes.  Make sure to get the flu shot every year.  Contact a health care provider if:  Your symptoms do not improve in 2 weeks of treatment. Get help right away if:  You cough up blood.  You have chest pain.  You have severe shortness  of breath.  You become dehydrated.  You faint or keep feeling like you are going to faint.  You keep vomiting.  You have a severe headache.  Your fever or chills gets worse. This information is not intended to replace advice given to you by your health care provider. Make sure you discuss any questions you have with your health care provider. Document Released: 10/07/2004 Document Revised: 03/24/2016 Document Reviewed: 02/18/2016 Elsevier Interactive Patient Education  2017 [employer].

## 2017-08-09 ENCOUNTER — Other Ambulatory Visit: Payer: Self-pay | Admitting: Physician Assistant

## 2017-08-09 ENCOUNTER — Ambulatory Visit: Payer: PPO | Admitting: Physician Assistant

## 2017-08-09 ENCOUNTER — Ambulatory Visit (HOSPITAL_COMMUNITY)
Admission: RE | Admit: 2017-08-09 | Discharge: 2017-08-09 | Disposition: A | Discharge status: Home / Self Care | Payer: PPO | Source: Ambulatory Visit | Attending: Physician Assistant | Admitting: Physician Assistant

## 2017-08-09 ENCOUNTER — Encounter: Payer: Self-pay | Admitting: Physician Assistant

## 2017-08-09 ENCOUNTER — Telehealth: Payer: Self-pay | Admitting: *Deleted

## 2017-08-09 VITALS — BP 120/78 | Temp 97.7°F | Resp 14 | Ht 63.0 in | Wt 123.0 lb

## 2017-08-09 DIAGNOSIS — R05 Cough: Secondary | ICD-10-CM

## 2017-08-09 DIAGNOSIS — J438 Other emphysema: Secondary | ICD-10-CM | POA: Diagnosis not present

## 2017-08-09 DIAGNOSIS — J441 Chronic obstructive pulmonary disease with (acute) exacerbation: Secondary | ICD-10-CM

## 2017-08-09 DIAGNOSIS — I700 Atherosclerosis of aorta: Secondary | ICD-10-CM | POA: Insufficient documentation

## 2017-08-09 DIAGNOSIS — R059 Cough, unspecified: Secondary | ICD-10-CM

## 2017-08-09 MED ORDER — PREDNISONE 20 MG PO TABS: ORAL_TABLET | ORAL | 0 refills | Status: DC

## 2017-08-09 NOTE — Patient Instructions (Signed)
Can do trelegy samples once a day, wash mouth afterwards Do nebulizer twice a day  Will do another short prednisone dose  Call Dr. Marin Olp, may need infusion   Chronic Obstructive Pulmonary Disease Exacerbation Chronic obstructive pulmonary disease (COPD) is a common lung condition in which airflow from the lungs is limited. COPD is a general term that can be used to describe many different lung problems that limit airflow, including chronic bronchitis and emphysema. COPD exacerbations are episodes when breathing symptoms become much worse and require extra treatment. Without treatment, COPD exacerbations can be life threatening, and frequent COPD exacerbations can cause further damage to your lungs. What are the causes?  Respiratory infections.  Exposure to smoke.  Exposure to air pollution, chemical fumes, or dust. Sometimes there is no apparent cause or trigger. What increases the risk?  Smoking cigarettes.  Older age.  Frequent prior COPD exacerbations. What are the signs or symptoms?  Increased coughing.  Increased thick spit (sputum) production.  Increased wheezing.  Increased shortness of breath.  Rapid breathing.  Chest tightness. How is this diagnosed? Your medical history, a physical exam, and tests will help your health care provider make a diagnosis. Tests may include:  A chest X-ray.  Basic lab tests.  Sputum testing.  An arterial blood gas test.  How is this treated? Depending on the severity of your COPD exacerbation, you may need to be admitted to a hospital for treatment. Some of the treatments commonly used to treat COPD exacerbations are:  Antibiotic medicines.  Bronchodilators. These are drugs that expand the air passages. They may be given with an inhaler or nebulizer. Spacer devices may be needed to help improve drug delivery.  Corticosteroid medicines.  Supplemental oxygen therapy.  Airway clearing techniques, such as noninvasive  ventilation (NIV) and positive expiratory pressure (PEP). These provide respiratory support through a mask or other noninvasive device.  Follow these instructions at home:  Do not smoke. Quitting smoking is very important to prevent COPD from getting worse and exacerbations from happening as often.  Avoid exposure to all substances that irritate the airway, especially to tobacco smoke.  If you were prescribed an antibiotic medicine, finish it all even if you start to feel better.  Take all medicines as directed by your health care provider.It is important to use correct technique with inhaled medicines.  Drink enough fluids to keep your urine clear or pale yellow (unless you have a medical condition that requires fluid restriction).  Use a cool mist vaporizer. This makes it easier to clear your chest when you cough.  If you have a home nebulizer and oxygen, continue to use them as directed.  Maintain all necessary vaccinations to prevent infections.  Exercise regularly.  Eat a healthy diet.  Keep all follow-up appointments as directed by your health care provider. Get help right away if:  You have worsening shortness of breath.  You have trouble talking.  You have severe chest pain.  You have blood in your sputum.  You have a fever.  You have weakness, vomit repeatedly, or faint.  You feel confused.  You continue to get worse. This information is not intended to replace advice given to you by your health care provider. Make sure you discuss any questions you have with your health care provider. Document Released: 06/27/2007 Document Revised: 02/05/2016 Document Reviewed: 05/04/2013 Elsevier Interactive Patient Education  2017 [employer].

## 2017-08-09 NOTE — Progress Notes (Signed)
Subjective:    Patient ID: Shannon Parsons, female    DOB: April 21, 1941, 76 y.o.   MRN: [id number]  HPI 76 y.o. WF with history of HTN, COPD, asthma, diverticulosis, anxiety, being treated for Mantle cell lymphoma at cancer center with Ennever with IGF def presents cough, she is feeling better but still has fatigue, coughing a lot of mucus green. No fever, chills. She was seen 11/08, 11/21 for COPD exacerbation and given zpak/levaquin and prednisone.  Has been doing nebulizer at home, 2 x a day.    Blood pressure 120/78, temperature 97.7 F (36.5 C), resp. rate 14, height 5\' 3"  (1.6 m), weight 123 lb (55.8 kg), last menstrual period 09/14/2014.  Medications Current Outpatient Medications on File Prior to Visit  Medication Sig  . acetaminophen (TYLENOL) 325 MG tablet Take 325 mg by mouth every 6 (six) hours as needed (For pain.).   [name] ALPRAZolam (XANAX) 0.5 MG tablet TAKE 1/2 TO 1 TABLET BY MOUTH 3 TIMES A DAY AS NEEDED FOR ANXIETY OR SLEEP.  [name] amoxicillin-clavulanate (AUGMENTIN) 875-125 MG tablet Take 1 tablet by mouth 2 (two) times daily for 7 days.  . Cholecalciferol (VITAMIN D) 2000 UNITS CAPS Take 2,000-4,000 capsules by mouth every other day. Patient alternates 1 cap with 2 caps every other day.  . hyoscyamine (LEVSIN, ANASPAZ) 0.125 MG tablet Take 1 to 2 tablets every 4 hours as needed for nausea, cramping, bloating or diarrhea  . levothyroxine (SYNTHROID, LEVOTHROID) 50 MCG tablet TAKE 1 TABLET BY MOUTH ONCE DAILY.  [name] lidocaine-prilocaine (EMLA) cream Apply 1 application topically as needed. Apply to [employer] one hour before access.  . Omega-3 Fatty Acids (FISH OIL) 1000 MG CAPS Take by mouth daily.  [name] PROAIR HFA 108 (90 Base) MCG/ACT inhaler INHALE 2 PUFFS EVERY 4 HOURS AS NEEDED INTO THE LUNGS FOR WHEEZING OR SHORTNESS OF BREATH.  . Probiotic Product (PROBIOTIC DAILY PO) Take 1 capsule by mouth daily.  [name] Respiratory Therapy Supplies (FLUTTER) DEVI Take as directed  . amoxicillin (AMOXIL)  500 MG capsule Take 4 pills one hour before dental procedure. (Patient not taking: Reported on 08/09/2017)   No current facility-administered medications on file prior to visit.     Problem list She has Chronic obstructive airway disease with asthma ([place]); History of colonic polyps; Hyperlipidemia; Hypertension; GERD; Osteoporosis  (Refuses treatment); Osteoarthritis; Vitamin D deficiency; Prediabetes; IBS; Diverticulosis; Anxiety state; OSA (obstructive sleep apnea); Essential tremor; Medication management; Mantle cell lymphoma ([place]); Normocytic anemia; Leg DVT (deep venous thromboembolism), acute ([place]); Encounter for Medicare annual wellness exam; Body mass index (BMI) of 20.0-20.9 in adult; Obstructive bronchiectasis ([place]); Acquired hypogammaglobulinemia ([place]); and Mantle cell lymphoma of intra-abdominal lymph nodes ([place]) on their problem list.   Review of Systems  Constitutional: Positive for fatigue. Negative for chills, diaphoresis and fever.  HENT: Negative for congestion, ear pain, postnasal drip, sinus pressure, sneezing and sore throat.   Respiratory: Positive for cough and chest tightness. Negative for apnea, shortness of breath and wheezing.   Cardiovascular: Negative.  Negative for chest pain, palpitations and leg swelling.  Gastrointestinal: Negative.   Genitourinary: Negative.   Musculoskeletal: Negative for neck pain.  Neurological: Positive for headaches.       Objective:   Physical Exam  Constitutional: She is oriented to person, place, and time. She appears well-developed and well-nourished.  HENT:  Right Ear: Hearing normal. No mastoid tenderness. Tympanic membrane is not injected, not perforated, not erythematous, not retracted and not bulging. No middle ear effusion.  Left Ear: Hearing normal. No mastoid tenderness. Tympanic membrane is not injected, not perforated, not erythematous, not retracted and not bulging.  No middle ear effusion.  Nose: Right sinus exhibits  no maxillary sinus tenderness. Left sinus exhibits no maxillary sinus tenderness.  Mouth/Throat: Uvula is midline, oropharynx is clear and moist and mucous membranes are normal.  Eyes: Conjunctivae and EOM are normal. Pupils are equal, round, and reactive to light.  Neck: Neck supple.  Cardiovascular: Normal rate and regular rhythm.  Pulmonary/Chest: Effort normal. No respiratory distress. She has no decreased breath sounds. She has wheezes. She has no rhonchi. She has no rales.  Abdominal: Soft. Bowel sounds are normal.  Musculoskeletal: Normal range of motion.  Lymphadenopathy:    She has no cervical adenopathy.  Neurological: She is alert and oriented to person, place, and time.  Skin: Skin is warm and dry.       Assessment & Plan:   COPD exacerbation CXR showed no infection, just COPD exacerbation Will do trelegy samples, continue nebulizer at home 3 -4 x times Prednisone dose again Will call Dr. Marin Olp? Need infusion- has IGG def If worse go to ER  May need daily inhaler, will start with trelegy and step down after exacerbation  Future Appointments  Date Time Provider [place]  08/09/2017  3:00 PM Vicie Mutters, PA-C [facility] None  09/12/2017 11:00 AM [facility] B3 [facility] None  09/15/2017  2:00 PM Unk Pinto, MD [facility] None  11/10/2017  8:15 AM [facility] LAB [facility] None  11/10/2017  8:30 AM [facility] A1 [facility] None  11/10/2017  9:00 AM Ennever, Rudell Cobb, MD [facility] None  11/10/2017  9:30 AM [facility] A1 [facility] None

## 2017-08-09 NOTE — Telephone Encounter (Signed)
Patient has been struggling with bronchitis for the last three weeks despite treatment from her PCP. She states last time this happened she needed a privigen infusion. She'd like to know if she can have another one.  Reviewed with Dr Marin Olp. It's been almost one year since her Ig levels have been checked. He would like her to come in and have some labs drawn to determine need for infusion.   Patient is aware of recommendation. She saw another physician today. They have prescribed more medication. She would like to wait and see if this new treatment works before coming in. She will call us back if needed.

## 2017-08-11 ENCOUNTER — Encounter: Payer: Self-pay | Admitting: Internal Medicine

## 2017-08-11 ENCOUNTER — Ambulatory Visit: Payer: PPO | Admitting: Adult Health

## 2017-08-11 ENCOUNTER — Ambulatory Visit: Payer: PPO | Admitting: Internal Medicine

## 2017-08-11 VITALS — BP 124/78 | HR 66 | Ht 63.5 in | Wt 123.8 lb

## 2017-08-11 DIAGNOSIS — R05 Cough: Secondary | ICD-10-CM

## 2017-08-11 DIAGNOSIS — J479 Bronchiectasis, uncomplicated: Secondary | ICD-10-CM | POA: Diagnosis not present

## 2017-08-11 DIAGNOSIS — R059 Cough, unspecified: Secondary | ICD-10-CM

## 2017-08-11 MED ORDER — AMOXICILLIN-POT CLAVULANATE 875-125 MG PO TABS: 1.0000 | ORAL_TABLET | Freq: Two times a day (BID) | ORAL | 0 refills | Status: DC

## 2017-08-11 NOTE — Progress Notes (Signed)
Subjective:     Patient ID: Shannon Parsons, female   DOB: 09-Mar-1941     MRN: [id number]    Brief patient profile:  41 yowf never smoker with sev bad childhood pneumonia's and asthma but seemed outgrow it until in 1970s with assoc nasal/chest congestions/ cough >    took allergy shots x 5 years and much better except for spells of head cold going into chest not related to seasons  with sev visits per year s chronic rx and prev eval by Clance who recorded it was worse in spring and fall and rec maint rx with qvar and rec try singulair but she never did either and referred to pulmonary clinic 03/18/2016 by Dr Vicie Mutters back to pulmonary clinic     History of Present Illness  03/18/2016 1st [facility] office visit/ Martavius Lusty  On Retuximab  And prn  saba only  Chief Complaint  Patient presents with  . Pulmonary Consult    Referred by Vicie Mutters. Pt c/o cough since April 2017- prod with yellow sputm. Cough is worse about 3 hours after she wakes up in the am.   symptoms of cough/ congestion/ wheezing/ rattling x years but much worse since mid April 2017 rx with muliptle courses of abx and prednisone   Cough is worse in am's x 3 h - no flutter valve / only using saba maybe once a day, very poor insight into hx/ meds / prev instructions rec Bronchiectasis =   you have scarring of your bronchial tubes   Plan A = Automatic = Symbiocort 80 Take 2 puffs first thing in am and then another 2 puffs about 12 hours later.  Work on inhaler technique:   Plan B = Backup Only use your albuterol as a rescue medication  Plan C = Crisis - only use your albuterol nebulizer if you first try Plan B  For cough try mucinex 1200 mg every 12 hours as needed / flutter valve as much as can  GERD (REFLUX) diet   04/01/16 Cipro x 5 days      08/11/2017  acute  ov/ [place] re: obstructive bronchiectasis flare  Chief Complaint  Patient presents with  . Acute Visit    Pt c/o cough and "burning in my chest" x 3 wks.  Cough is prod with green sputum. She has taken zithromax and is currently in augmentin and pred. She was just started on Trelegy. She is using Duonebs 1-2 x daily and has not tried her proair recently.   just started trelegy x sev days prior to OV  And thinks it's helping chest "burning"and sob  Sick since 1st week in November 2018  Can't tolerate fq or cipro in past ? Gi related but thinks she's getting better p 5/7 days augmentin prescribed by PCP  Doe = MMRC3 = can't walk 100 yards even at a slow pace at a flat grade s stopping due to sob     No obvious day to day or daytime variability or assoc  mucus plugs or hemoptysis or  chest tightness, subjective wheeze or overt sinus or hb symptoms. No unusual exposure hx or h/o childhood pna/ asthma or knowledge of premature birth.  Sleeping ok flat without nocturnal  or early am exacerbation  of respiratory  c/o's or need for noct saba. Also denies any obvious fluctuation of symptoms with weather or environmental changes or other aggravating or alleviating factors except as outlined above   Current Allergies, Complete Past Medical History,  Past Surgical History, Family History, and Social History were reviewed in [employer] record.  ROS  The following are not active complaints unless bolded Hoarseness, sore throat, dysphagia, dental problems, itching, sneezing,  nasal congestion or discharge of excess mucus or purulent secretions, ear ache,   fever, chills, sweats, unintended wt loss or wt gain, classically pleuritic or exertional cp,  orthopnea pnd or leg swelling, presyncope, palpitations, abdominal pain, anorexia, nausea, vomiting, diarrhea  or change in bowel habits or change in bladder habits, change in stools or change in urine, dysuria, hematuria,  rash, arthralgias, visual complaints, headache, numbness, weakness or ataxia or problems with walking or coordination,  change in mood/affect or memory.        Current Meds   Medication Sig  . acetaminophen (TYLENOL) 325 MG tablet Take 325 mg by mouth every 6 (six) hours as needed (For pain.).   [name] ALPRAZolam (XANAX) 0.5 MG tablet TAKE 1/2 TO 1 TABLET BY MOUTH 3 TIMES A DAY AS NEEDED FOR ANXIETY OR SLEEP.  [name] amoxicillin-clavulanate (AUGMENTIN) 875-125 MG tablet Take 1 tablet by mouth 2 (two) times daily.  . Cholecalciferol (VITAMIN D) 2000 UNITS CAPS Take 2,000-4,000 capsules by mouth every other day. Patient alternates 1 cap with 2 caps every other day.  . Fluticasone-Umeclidin-Vilant (TRELEGY ELLIPTA) 100-62.5-25 MCG/INH AEPB Inhale 1 puff into the lungs daily.  . hyoscyamine (LEVSIN, ANASPAZ) 0.125 MG tablet Take 1 to 2 tablets every 4 hours as needed for nausea, cramping, bloating or diarrhea  . ipratropium-albuterol (DUONEB) 0.5-2.5 (3) MG/3ML SOLN Take 3 mLs by nebulization every 4 (four) hours as needed.  [name] levothyroxine (SYNTHROID, LEVOTHROID) 50 MCG tablet TAKE 1 TABLET BY MOUTH ONCE DAILY.  [name] lidocaine-prilocaine (EMLA) cream Apply 1 application topically as needed. Apply to [facility] one hour before access.  . Omega-3 Fatty Acids (FISH OIL) 1000 MG CAPS Take by mouth daily.  . predniSONE (DELTASONE) 20 MG tablet 2 tablets daily for 3 days, 1 tablet daily for 4 days.  [name] PROAIR HFA 108 (90 Base) MCG/ACT inhaler INHALE 2 PUFFS EVERY 4 HOURS AS NEEDED INTO THE LUNGS FOR WHEEZING OR SHORTNESS OF BREATH.  . Probiotic Product (PROBIOTIC DAILY PO) Take 1 capsule by mouth daily.  [name] Respiratory Therapy Supplies (FLUTTER) DEVI Take as directed                    Objective:   Physical Exam  amb wf      04/21/2016         118   03/18/16 121 lb (54.885 kg)  02/19/16 120 lb (54.432 kg)  02/03/16 118 lb (53.524 kg)   LUNGS: no acc muscle use,  Nl contour chest with mid to late exp bilateral  Rhonchi    HEENT: nl dentition, turbinates bilaterally, and oropharynx. Nl external ear canals without cough reflex   NECK :  without JVD/Nodes/TM/ nl carotid upstrokes  bilaterally   LUNGS: no acc muscle use,  Nl contour chest with late bilateral exp rhonchi    CV:  RRR  no s3 or murmur or increase in P2, and no edema   ABD:  soft and nontender with nl inspiratory excursion in the supine position. No bruits or organomegaly appreciated, bowel sounds nl  MS:  Nl gait/ ext warm without deformities, calf tenderness, cyanosis or clubbing No obvious joint restrictions   SKIN: warm and dry without lesions    NEURO:  alert, approp, nl sensorium with  no motor or cerebellar  deficits apparent.         I personally reviewed images and agree with radiology impression as follows:  CXR:   08/09/17 Emphysematous and bronchitic changes consistent with COPD or asthma.  No acute infiltrate.   Assessment:

## 2017-08-11 NOTE — Patient Instructions (Addendum)
For cough > mucinex OR mucinex dm up to 1200 mg every 12 hours and use flutter valve as much as you can   Whenever coughing for any reason:  Try prilosec otc 20mg   Take 30-60 min before first meal of the day and Pepcid ac (famotidine) 20 mg one @  bedtime until cough is completely gone for at least a week without the need for cough suppression  GERD (REFLUX)  is an extremely common cause of respiratory symptoms just like yours , many times with no obvious heartburn at all.    It can be treated with medication, but also with lifestyle changes including elevation of the head of your bed (ideally with 6 inch  bed blocks),  Smoking cessation, avoidance of late meals, excessive alcohol, and avoid fatty foods, chocolate, peppermint, colas, red wine, and acidic juices such as orange juice.  NO MINT OR MENTHOL PRODUCTS SO NO COUGH DROPS  USE SUGARLESS CANDY INSTEAD (Jolley ranchers or Stover's or Life Savers) or even ice chips will also do - the key is to swallow to prevent all throat clearing. NO OIL BASED VITAMINS - use powdered substitutes.    Continue Trelegy for now   Nebulizer is as needed to 4 hours as needed for  Cough/ congestion/ wheezing   Augmentin 875 mg take one pill twice daily  X 10 days - take at breakfast and supper with large glass of water.  It would help reduce the usual side effects (diarrhea and yeast infections) if you ate cultured yogurt at lunch   Please schedule a follow up office visit in 6 weeks, call sooner if needed with pfts on return

## 2017-08-12 ENCOUNTER — Encounter: Payer: Self-pay | Admitting: Internal Medicine

## 2017-08-12 NOTE — Assessment & Plan Note (Signed)
PFT's 03/2011:  FEV1 1.72 (82%), ratio 63, ++airtrapping, DLCO 108% pred AAT 2012: no s or z mutations noted. (therefore MM) 03/18/2016  After extensive coaching HFA effectiveness =   50% > try symbicort 80 2bid - flutter valve added 03/18/2016  - PC 04/01/2016  Green sputum on augmentin > rec Cipro 500 mg bid x 10 days  > could not tol/ non-specific symptoms - started IVIG  04/14/16 > improved at f/u ov 04/21/2016  - Sinus CT  04/23/16 >  Pos Evidence of acute sinus disease with frothy secretions of the frontal sinuses, bilateral sphenoid sinuses, right maxillary sinus. Lobulated soft tissue at the inferior left maxillary sinus, potentially mucous retention cyst or polyp>  Refer to ENT  > Constance Holster  05/06/16 neg     Acute flare ongoing since early November seems to be improving on trelegy though there is a risk of aggravating the upper airway using dpi's over hfa's - they are easier to use  - The proper method of use, as well as anticipated side effects, of a metered-dose inhaler are discussed and demonstrated to the patient > must rinse and gargle when done    Also: Of the three most common causes of  Sub-acute or recurrent or chronic cough, only one (GERD)  can actually contribute to/ trigger  the other two (asthma and post nasal drip syndrome)  and perpetuate the cylce of cough.  While not intuitively obvious, many patients with chronic low grade reflux do not cough until there is a primary insult that disturbs the protective epithelial barrier and exposes sensitive nerve endings.   This is typically viral but can be direct physical injury such as with an endotracheal tube.   The point is that once this occurs, it is difficult to eliminate the cycle  using anything but a maximally effective acid suppression regimen at least in the short run, accompanied by an appropriate diet to address non acid GERD and control / eliminate the cough itself with mucinex and flutter valve, reviewed   I had an extended  discussion with the patient reviewing all relevant studies completed to date and  lasting 15 to 20 minutes of a 25 minute acute office visit    Each maintenance medication was reviewed in detail including most importantly the difference between maintenance and prns and under what circumstances the prns are to be triggered using an action plan format that is not reflected in the computer generated alphabetically organized AVS.    Please see AVS for specific instructions unique to this visit that I personally wrote and verbalized to the the pt in detail and then reviewed with pt  by my nurse highlighting any  changes in therapy recommended at today's visit to their plan of care.

## 2017-09-12 ENCOUNTER — Ambulatory Visit (HOSPITAL_BASED_OUTPATIENT_CLINIC_OR_DEPARTMENT_OTHER): Payer: PPO

## 2017-09-12 VITALS — BP 129/68 | HR 66 | Temp 98.5°F | Resp 20

## 2017-09-12 DIAGNOSIS — Z95828 Presence of other vascular implants and grafts: Secondary | ICD-10-CM

## 2017-09-12 DIAGNOSIS — C8313 Mantle cell lymphoma, intra-abdominal lymph nodes: Secondary | ICD-10-CM | POA: Diagnosis not present

## 2017-09-12 MED ORDER — SODIUM CHLORIDE 0.9% FLUSH
10.0000 mL | INTRAVENOUS | Status: DC | PRN
  Administered 2017-09-12: 10 mL via INTRAVENOUS
  Filled 2017-09-12: qty 10

## 2017-09-12 MED ORDER — HEPARIN SOD (PORK) LOCK FLUSH 100 UNIT/ML IV SOLN
500.0000 [IU] | Freq: Once | INTRAVENOUS | Status: AC
  Administered 2017-09-12: 500 [IU] via INTRAVENOUS
  Filled 2017-09-12: qty 5

## 2017-09-15 ENCOUNTER — Encounter: Payer: Self-pay | Admitting: Internal Medicine

## 2017-09-15 ENCOUNTER — Ambulatory Visit (INDEPENDENT_AMBULATORY_CARE_PROVIDER_SITE_OTHER): Payer: PPO | Admitting: Internal Medicine

## 2017-09-15 VITALS — BP 120/80 | HR 68 | Temp 97.0°F | Resp 16 | Ht 64.0 in | Wt 125.8 lb

## 2017-09-15 DIAGNOSIS — Z136 Encounter for screening for cardiovascular disorders: Secondary | ICD-10-CM

## 2017-09-15 DIAGNOSIS — Z0001 Encounter for general adult medical examination with abnormal findings: Secondary | ICD-10-CM

## 2017-09-15 DIAGNOSIS — Z1211 Encounter for screening for malignant neoplasm of colon: Secondary | ICD-10-CM

## 2017-09-15 DIAGNOSIS — E782 Mixed hyperlipidemia: Secondary | ICD-10-CM

## 2017-09-15 DIAGNOSIS — R7303 Prediabetes: Secondary | ICD-10-CM | POA: Diagnosis not present

## 2017-09-15 DIAGNOSIS — E039 Hypothyroidism, unspecified: Secondary | ICD-10-CM

## 2017-09-15 DIAGNOSIS — Z79899 Other long term (current) drug therapy: Secondary | ICD-10-CM

## 2017-09-15 DIAGNOSIS — E559 Vitamin D deficiency, unspecified: Secondary | ICD-10-CM

## 2017-09-15 DIAGNOSIS — K2100 Gastro-esophageal reflux disease with esophagitis, without bleeding: Secondary | ICD-10-CM

## 2017-09-15 DIAGNOSIS — R7309 Other abnormal glucose: Secondary | ICD-10-CM

## 2017-09-15 DIAGNOSIS — Z0000 Encounter for general adult medical examination without abnormal findings: Secondary | ICD-10-CM | POA: Diagnosis not present

## 2017-09-15 DIAGNOSIS — I700 Atherosclerosis of aorta: Secondary | ICD-10-CM

## 2017-09-15 DIAGNOSIS — G4733 Obstructive sleep apnea (adult) (pediatric): Secondary | ICD-10-CM

## 2017-09-15 DIAGNOSIS — Z1212 Encounter for screening for malignant neoplasm of rectum: Secondary | ICD-10-CM

## 2017-09-15 DIAGNOSIS — I10 Essential (primary) hypertension: Secondary | ICD-10-CM

## 2017-09-15 DIAGNOSIS — C8313 Mantle cell lymphoma, intra-abdominal lymph nodes: Secondary | ICD-10-CM

## 2017-09-15 MED ORDER — DIAZEPAM 5 MG PO TABS: ORAL_TABLET | ORAL | 1 refills | Status: AC

## 2017-09-15 NOTE — Patient Instructions (Signed)

## 2017-09-15 NOTE — Progress Notes (Signed)
[facility] Unk Pinto, M.D.     Uvaldo Bristle. Silverio Lay, P.A.-C Liane Comber, [place] [address] [place], [place] [address] Telephone [phone] Telefax [phone] Annual Screening/Preventative Visit & Comprehensive Evaluation &  Examination     This very nice 77 y.o. MWF presents for a Screening/Preventative Visit & comprehensive evaluation and management of multiple medical co-morbidities.  Patient has been followed for labile HTN, Prediabetes, Hyperlipidemia, Hypothyroidism, Essential Hereditary Tremor and Vitamin D Deficiency. Patient also has Osteoporosis & has repeatedlydeclined any treatments as recommended by Dr Marin Olp and myself.                                        Patient was dx'd w/Mantle cell Lymphoma in Jan 2016 treatedwith Chemoradiation  by Dr Marin Olp and by Sept 2016, she was induced into remission & has since been on maintenance with Rituxan q2 months thru Nov 2018.. In Apr 2016 she was dx'd/tx'd for a Rt Leg DVT with Xarelto for 6 months. Last seen in Oct & has scheduled f/u in Feb w/Dr Ennever.   Patient has CXR dx/o COPD/Emphysema and has been followed by Dr Melvyn Novas for recurrent pulmonary infections during this time of depressed immunity on Chemoradiation and with hypogammaglobulinemia.       Patient has long hx/o labile HTN monitored expectantly. Patient's BP has been controlled at home and she denies any cardiac symptoms as chest pain, palpitations, shortness of breath, dizziness or ankle swelling. Today's BP is at goal -  120/80.      Patient's hyperlipidemia is not controlled with diet and as she has medicine phobia , she has declibned treatment in the past. Last lipids were not at goal: Lab Results  Component Value Date   CHOL 226 (H) 06/13/2017   HDL 51 06/13/2017   LDLCALC 129 (H) 03/01/2017   TRIG 255 (H) 06/13/2017   CHOLHDL 4.4 06/13/2017      Patient has prediabetes  predating (A1c 5.9%/2012) and patient denies reactive hypoglycemic symptoms, visual blurring, diabetic polys, or paresthesias. With a 20 # weight loss over the last 2 years her last A1c was normal at goal: Lab Results  Component Value Date   HGBA1C 5.6 03/01/2017      Finally, patient has history of Vitamin D Deficiency and last Vitamin D was at goal. Lab Results  Component Value Date   VD25OH 58.5 05/10/2017   Current Outpatient Medications on File Prior to Visit  Medication Sig  . acetaminophen (TYLENOL) 325 MG tablet Take 325 mg by mouth every 6 (six) hours as needed (For pain.).   [name] ALPRAZolam (XANAX) 0.5 MG tablet TAKE 1/2 TO 1 TABLET BY MOUTH 3 TIMES A DAY AS NEEDED FOR ANXIETY OR SLEEP.  [name] amoxicillin (AMOXIL) 500 MG capsule Take 4 pills one hour before dental procedure.  . Cholecalciferol (VITAMIN D) 2000 UNITS CAPS Take 2,000 capsules by mouth 2 (two) times daily.   . hyoscyamine (LEVSIN, ANASPAZ) 0.125 MG tablet Take 1 to 2 tablets every 4 hours as needed for nausea, cramping, bloating or diarrhea  . ipratropium-albuterol (DUONEB) 0.5-2.5 (3) MG/3ML SOLN Take 3 mLs by nebulization every 4 (four) hours as needed.  [name] levothyroxine (SYNTHROID, LEVOTHROID) 50 MCG tablet TAKE 1 TABLET BY MOUTH ONCE DAILY.  [name] lidocaine-prilocaine (EMLA) cream Apply 1 application topically as needed. Apply to [facility] one hour before access.  [name]  PROAIR HFA 108 (90 Base) MCG/ACT inhaler INHALE 2 PUFFS EVERY 4 HOURS AS NEEDED INTO THE LUNGS FOR WHEEZING OR SHORTNESS OF BREATH.  . Probiotic Product (PROBIOTIC DAILY PO) Take 1 capsule by mouth daily.  [name] Respiratory Therapy Supplies (FLUTTER) DEVI Take as directed   No current facility-administered medications on file prior to visit.    Allergies  Allergen Reactions  . Actonel [Risedronate Sodium] Other (See Comments)    "made me choke"   . Fosamax [Alendronate] Other (See Comments)    "made me choke"  . Sulfa Antibiotics Other (See Comments)    Doesn't  remember   . Tetracyclines & Related Rash   Past Medical History:  Diagnosis Date  . Adenomatous colon polyp 1994  . Asthma   . Cataract 2013   bilateral eyes  . Diverticulosis   . Family history of ischemic heart disease   . Fibrocystic breast disease   . GERD (gastroesophageal reflux disease)   . Hiatal hernia   . Hyperlipidemia   . Hypertension   . IBS (irritable bowel syndrome)   . Internal hemorrhoids   . Mantle cell lymphoma ([place]) 10/18/2014  . Melanoma ([place])    Facial  . OSA (obstructive sleep apnea)   . Osteoarthritis   . Osteoporosis   . Prediabetes   . Sleep apnea   . Unspecified hypothyroidism   . Vitamin D deficiency    Health Maintenance  Topic Date Due  . TETANUS/TDAP  08/17/1962  . INFLUENZA VACCINE  01/04/2018 (Originally 04/13/2017)  . MAMMOGRAM  06/30/2019  . COLONOSCOPY  02/02/2022  . DEXA SCAN  Completed  . PNA vac Low Risk Adult  Completed   Immunization History  Administered Date(s) Administered  . Pneumococcal Conjugate-13 03/18/2016  . Pneumococcal Polysaccharide-23 06/18/2013  . Zoster 04/17/2013   Last Colon -  Last Pap -  Past Surgical History:  Procedure Laterality Date  . APPENDECTOMY    . BUNIONECTOMY    . KNEE ARTHROSCOPY    . TONSILLECTOMY    . UMBILICAL HERNIA REPAIR     Family History  Problem Relation Age of Onset  . Emphysema Father   . Heart disease Father   . Heart attack Father 6       Died  . Heart disease Brother   . Rheum arthritis Sister   . Hypertension Mother   . Thyroid disease Mother   . Colon cancer Neg Hx   . Stomach cancer Neg Hx    Social History   Tobacco Use  . Smoking status: Never Smoker  . Smokeless tobacco: Never Used  . Tobacco comment: Never Used Tobacco  Substance Use Topics  . Alcohol use: No    Alcohol/week: 0.0 oz  . Drug use: No    ROS Constitutional: Denies fever, chills, weight loss/gain, headaches, insomnia,  night sweats, and change in appetite. Does c/o fatigue. Eyes:  Denies redness, blurred vision, diplopia, discharge, itchy, watery eyes.  ENT: Denies discharge, congestion, post nasal drip, epistaxis, sore throat, earache, hearing loss, dental pain, Tinnitus, Vertigo, Sinus pain, snoring.  Cardio: Denies chest pain, palpitations, irregular heartbeat, syncope, dyspnea, diaphoresis, orthopnea, PND, claudication, edema Respiratory: denies cough, dyspnea, DOE, pleurisy, hoarseness, laryngitis, wheezing.  Gastrointestinal: Denies dysphagia, heartburn, reflux, water brash, pain, cramps, nausea, vomiting, bloating, diarrhea, constipation, hematemesis, melena, hematochezia, jaundice, hemorrhoids Genitourinary: Denies dysuria, frequency, urgency, nocturia, hesitancy, discharge, hematuria, flank pain Breast: Breast lumps, nipple discharge, bleeding.  Musculoskeletal: Denies arthralgia, myalgia, stiffness, Jt. Swelling, pain, limp, and strain/sprain. Denies falls.  Skin: Denies puritis, rash, hives, warts, acne, eczema, changing in skin lesion Neuro: No weakness, tremor, incoordination, spasms, paresthesia, pain Psychiatric: Denies confusion, memory loss, sensory loss. Denies Depression. Endocrine: Denies change in weight, skin, hair change, nocturia, and paresthesia, diabetic polys, visual blurring, hyper / hypo glycemic episodes.  Heme/Lymph: No excessive bleeding, bruising, enlarged lymph nodes.  Physical Exam  BP 120/80   Pulse 68   Temp (!) 97 F (36.1 C)   Resp 16   Ht 5\' 4"  (1.626 m)   Wt 125 lb 12.8 oz (57.1 kg)   LMP 09/14/2014   BMI 21.59 kg/m   General Appearance: Well nourished, well groomed and in no apparent distress.  Eyes: PERRLA, EOMs, conjunctiva no swelling or erythema, normal fundi and vessels. Sinuses: No frontal/maxillary tenderness ENT/Mouth: EACs patent / TMs  nl. Nares clear without erythema, swelling, mucoid exudates. Oral hygiene is good. No erythema, swelling, or exudate. Tongue normal, non-obstructing. Tonsils not swollen or  erythematous. Hearing normal.  Neck: Supple, thyroid normal. No bruits, nodes or JVD. Respiratory: Respiratory effort normal.  BS equal and clear bilateral without rales, rhonci, wheezing or stridor. Cardio: Heart sounds are normal with regular rate and rhythm and no murmurs, rubs or gallops. Peripheral pulses are normal and equal bilaterally without edema. No aortic or femoral bruits. Chest: symmetric with normal excursions and percussion. Breasts: Symmetric, without lumps, nipple discharge, retractions, or fibrocystic changes.  Abdomen: Flat, soft with bowel sounds active. Nontender, no guarding, rebound, hernias, masses, or organomegaly.  Lymphatics: Non tender without lymphadenopathy.  Genitourinary:  Musculoskeletal: Full ROM all peripheral extremities, joint stability, 5/5 strength, and normal gait. Skin: Warm and dry without rashes, lesions, cyanosis, clubbing or  ecchymosis.  Neuro: Cranial nerves intact, reflexes equal bilaterally. Normal muscle tone, no cerebellar symptoms. Sensation intact.  Pysch: Alert and oriented X 3, normal affect, Insight and Judgment appropriate.   Assessment and Plan  1. Annual Preventative Screening Examination  2. Essential hypertension  - EKG 12-Lead - Urinalysis, Routine w reflex microscopic - Microalbumin / creatinine urine ratio - CBC with Differential/Platelet - BASIC METABOLIC PANEL WITH GFR - Magnesium - TSH  3. Hyperlipidemia, mixed  - EKG 12-Lead - Hepatic function panel - Lipid panel - TSH  4. Prediabetes  - EKG 12-Lead - Hemoglobin A1c - Insulin, random  5. Vitamin D deficiency  - VITAMIN D 25 Hydroxy   6. Hypothyroidism, unspecified type  - TSH  7. Abnormal glucose  - Hemoglobin A1c - Insulin, random  8. GERD  - CBC with Differential/Platelet  9. Mantle cell lymphoma of intra-abdominal lymph nodes (HCC)  - CBC with Differential/Platelet  10. Atherosclerosis of aorta (HCC)  - EKG 12-Lead  11. OSA  (obstructive sleep apnea)   12. Screening for colorectal cancer  - POC Hemoccult Bld/Stl  13. Screening for ischemic heart disease  - EKG 12-Lead  14. Medication management  - Urinalysis, Routine w reflex microscopic - Microalbumin / creatinine urine ratio - CBC with Differential/Platelet - BASIC METABOLIC PANEL WITH GFR - Hepatic function panel - Magnesium - Lipid panel - TSH - Hemoglobin A1c - Insulin, random - VITAMIN D 25 Hydroxy         Patient was counseled in prudent diet to achieve/maintain BMI less than 25 for weight control, BP monitoring, regular exercise and medications. Discussed med's effects and SE's. Screening labs and tests as requested with regular follow-up as recommended. Over 40 minutes of exam, counseling, chart review and high complex critical decision making was performed.

## 2017-09-16 ENCOUNTER — Encounter: Payer: Self-pay | Admitting: *Deleted

## 2017-09-16 LAB — CBC WITH DIFFERENTIAL/PLATELET
Basophils Absolute: 48 cells/uL (ref 0–200)
Basophils Relative: 0.5 %
EOS PCT: 1.7 %
Eosinophils Absolute: 163 cells/uL (ref 15–500)
HCT: 41.2 % (ref 35.0–45.0)
Hemoglobin: 13.8 g/dL (ref 11.7–15.5)
LYMPHS ABS: 893 {cells}/uL (ref 850–3900)
MCH: 29.7 pg (ref 27.0–33.0)
MCHC: 33.5 g/dL (ref 32.0–36.0)
MCV: 88.8 fL (ref 80.0–100.0)
MONOS PCT: 8.9 %
MPV: 9.9 fL (ref 7.5–12.5)
NEUTROS ABS: 7642 {cells}/uL (ref 1500–7800)
NEUTROS PCT: 79.6 %
PLATELETS: 241 10*3/uL (ref 140–400)
RBC: 4.64 10*6/uL (ref 3.80–5.10)
RDW: 12.9 % (ref 11.0–15.0)
Total Lymphocyte: 9.3 %
WBC mixed population: 854 cells/uL (ref 200–950)
WBC: 9.6 10*3/uL (ref 3.8–10.8)

## 2017-09-16 LAB — BASIC METABOLIC PANEL WITH GFR
BUN/Creatinine Ratio: 13 (calc) (ref 6–22)
BUN: 13 mg/dL (ref 7–25)
CALCIUM: 9.4 mg/dL (ref 8.6–10.4)
CO2: 31 mmol/L (ref 20–32)
CREATININE: 0.98 mg/dL — AB (ref 0.60–0.93)
Chloride: 102 mmol/L (ref 98–110)
GFR, EST NON AFRICAN AMERICAN: 57 mL/min/{1.73_m2} — AB (ref >=60)
GFR, Est African American: 66 mL/min/{1.73_m2} (ref >=60)
Glucose, Bld: 77 mg/dL (ref 65–99)
Potassium: 4.8 mmol/L (ref 3.5–5.3)
Sodium: 138 mmol/L (ref 135–146)

## 2017-09-16 LAB — URINALYSIS, ROUTINE W REFLEX MICROSCOPIC
BILIRUBIN URINE: NEGATIVE
Glucose, UA: NEGATIVE
HGB URINE DIPSTICK: NEGATIVE
KETONES UR: NEGATIVE
Leukocytes, UA: NEGATIVE
Nitrite: NEGATIVE
PH: 7.5 (ref 5.0–8.0)
Protein, ur: NEGATIVE
SPECIFIC GRAVITY, URINE: 1.004 (ref 1.001–1.03)

## 2017-09-16 LAB — HEPATIC FUNCTION PANEL
AG RATIO: 2.7 (calc) — AB (ref 1.0–2.5)
ALBUMIN MSPROF: 4.3 g/dL (ref 3.6–5.1)
ALT: 13 U/L (ref 6–29)
AST: 25 U/L (ref 10–35)
Alkaline phosphatase (APISO): 64 U/L (ref 33–130)
BILIRUBIN TOTAL: 0.4 mg/dL (ref 0.2–1.2)
Bilirubin, Direct: 0.1 mg/dL (ref 0.0–0.2)
Globulin: 1.6 g/dL (calc) — ABNORMAL LOW (ref 1.9–3.7)
Indirect Bilirubin: 0.3 mg/dL (calc) (ref 0.2–1.2)
Total Protein: 5.9 g/dL — ABNORMAL LOW (ref 6.1–8.1)

## 2017-09-16 LAB — LIPID PANEL
CHOL/HDL RATIO: 4 (calc) (ref <5.0)
Cholesterol: 250 mg/dL — ABNORMAL HIGH (ref <200)
HDL: 62 mg/dL (ref >50)
LDL CHOLESTEROL (CALC): 157 mg/dL — AB
NON-HDL CHOLESTEROL (CALC): 188 mg/dL — AB (ref <130)
TRIGLYCERIDES: 173 mg/dL — AB (ref <150)

## 2017-09-16 LAB — HEMOGLOBIN A1C
Hgb A1c MFr Bld: 5.6 % of total Hgb (ref <5.7)
Mean Plasma Glucose: 114 (calc)
eAG (mmol/L): 6.3 (calc)

## 2017-09-16 LAB — INSULIN, RANDOM: Insulin: 3.5 u[IU]/mL (ref 2.0–19.6)

## 2017-09-16 LAB — MICROALBUMIN / CREATININE URINE RATIO
Creatinine, Urine: 22 mg/dL (ref 20–275)
Microalb Creat Ratio: 14 mcg/mg creat (ref <30)
Microalb, Ur: 0.3 mg/dL

## 2017-09-16 LAB — TSH: TSH: 1.75 mIU/L (ref 0.40–4.50)

## 2017-09-16 LAB — MAGNESIUM: Magnesium: 2 mg/dL (ref 1.5–2.5)

## 2017-09-16 LAB — VITAMIN D 25 HYDROXY (VIT D DEFICIENCY, FRACTURES): Vit D, 25-Hydroxy: 49 ng/mL (ref 30–100)

## 2017-09-23 ENCOUNTER — Telehealth: Payer: Self-pay | Admitting: Internal Medicine

## 2017-09-23 ENCOUNTER — Ambulatory Visit: Payer: PPO | Admitting: Internal Medicine

## 2017-09-23 MED ORDER — AMOXICILLIN-POT CLAVULANATE 875-125 MG PO TABS: 1.0000 | ORAL_TABLET | Freq: Two times a day (BID) | ORAL | 0 refills | Status: DC

## 2017-09-23 NOTE — Telephone Encounter (Signed)
Spoke with pt, aware of recs.  rx sent to preferred pharmacy.  Nothing further needed.  

## 2017-09-23 NOTE — Telephone Encounter (Signed)
Augmentin 875 mg take one pill twice daily  X 10 days - take at breakfast and supper with large glass of water.  It would help reduce the usual side effects (diarrhea and yeast infections) if you ate cultured yogurt at lunch.  

## 2017-09-23 NOTE — Telephone Encounter (Signed)
Pt c/o prod cough with green mucus, stuffy nose, sinus congestion X5 days. Denies fever, chest pain, chills, body aches, dyspnea, wheezing. Pt has been taking tylenol and maintenance inhalers/nebs to help with s/s.    Pt has hx of lymphoma, wants to make sure she is doing everything she can to remain healthy.  Pt uses [facility].    MW please advise on recs.  Thanks!   Patient Instructions    For cough > mucinex OR mucinex dm up to 1200 mg every 12 hours and use flutter valve as much as you can    Whenever coughing for any reason:  Try prilosec otc 20mg   Take 30-60 min before first meal of the day and Pepcid ac (famotidine) 20 mg one @  bedtime until cough is completely gone for at least a week without the need for cough suppression   GERD (REFLUX)  is an extremely common cause of respiratory symptoms just like yours , many times with no obvious heartburn at all.     It can be treated with medication, but also with lifestyle changes including elevation of the head of your bed (ideally with 6 inch  bed blocks),  Smoking cessation, avoidance of late meals, excessive alcohol, and avoid fatty foods, chocolate, peppermint, colas, red wine, and acidic juices such as orange juice.  NO MINT OR MENTHOL PRODUCTS SO NO COUGH DROPS  USE SUGARLESS CANDY INSTEAD (Jolley ranchers or Stover's or Life Savers) or even ice chips will also do - the key is to swallow to prevent all throat clearing. NO OIL BASED VITAMINS - use powdered substitutes.     Continue Trelegy for now    Nebulizer is as needed to 4 hours as needed for  Cough/ congestion/ wheezing    Augmentin 875 mg take one pill twice daily  X 10 days - take at breakfast and supper with large glass of water.  It would help reduce the usual side effects (diarrhea and yeast infections) if you ate cultured yogurt at lunch     Please schedule a follow up office visit in 6 weeks, call sooner if needed with pfts on return     Patient Instructions        Bronchiectasis =   you have scarring of your bronchial tubes which means that they don't function perfectly normally and mucus tends to pool in certain areas of your lung which can cause pneumonia and further scarring of your lung and bronchial tubes      Plan A = Automatic = Symbiocort 80 Take 2 puffs first thing in am and then another 2 puffs about 12 hours later.    Work on inhaler technique:  relax and gently blow all the way out then take a nice smooth deep breath back in, triggering the inhaler at same time you start breathing in.  Hold for up to 5 seconds if you can. Blow out thru nose. Rinse and gargle with water when done     Plan B = Backup Only use your albuterol as a rescue medication    - The less you use it, the better it will work when you need it. - Ok to use the inhaler up to 2 puffs  every 4 hours if you must but call for appointment if use goes up over your usual need - Don't leave home without it !!  (think of it like the spare tire for your car)   Plan C = Crisis - only use  your albuterol nebulizer if you first try Plan B and it fails to help > ok to use the nebulizer up to every 4 hours but if start needing it regularly call for immediate appointment   For cough try mucinex 1200 mg every 12 hours as needed / flutter valve as much as can   GERD (REFLUX)  is an extremely common cause of respiratory symptoms just like yours , many times with no obvious heartburn at all.    It can be treated with medication, but also with lifestyle changes including elevation of the head of your bed (ideally with 6 inch  bed blocks),  Smoking cessation, avoidance of late meals, excessive alcohol, and avoid fatty foods, chocolate, peppermint, colas, red wine, and acidic juices such as orange juice.   NO MINT OR MENTHOL PRODUCTS SO NO COUGH DROPS  USE SUGARLESS CANDY INSTEAD (Jolley ranchers or Stover's or Life Savers) or even ice chips will also do - the key is to swallow to prevent all  throat clearing. NO OIL BASED VITAMINS - use powdered substitutes.  We need to give you the Prevnar 13 if ok with Dr Marin Olp    Please schedule a follow up office visit in 4 weeks, sooner if needed - I will let Dr Marin Olp know we need to postpone your next Retuximab until we get your cough better

## 2017-10-03 ENCOUNTER — Other Ambulatory Visit: Payer: Self-pay

## 2017-10-03 DIAGNOSIS — Z1211 Encounter for screening for malignant neoplasm of colon: Secondary | ICD-10-CM

## 2017-10-03 DIAGNOSIS — Z1212 Encounter for screening for malignant neoplasm of rectum: Principal | ICD-10-CM

## 2017-10-03 LAB — POC HEMOCCULT BLD/STL (HOME/3-CARD/SCREEN)
FECAL OCCULT BLD: NEGATIVE
FECAL OCCULT BLD: NEGATIVE
FECAL OCCULT BLD: NEGATIVE

## 2017-10-05 DIAGNOSIS — Z1212 Encounter for screening for malignant neoplasm of rectum: Secondary | ICD-10-CM | POA: Diagnosis not present

## 2017-10-07 ENCOUNTER — Encounter: Payer: Self-pay | Admitting: Internal Medicine

## 2017-10-07 ENCOUNTER — Ambulatory Visit (INDEPENDENT_AMBULATORY_CARE_PROVIDER_SITE_OTHER)
Admission: RE | Admit: 2017-10-07 | Discharge: 2017-10-07 | Disposition: A | Discharge status: Home / Self Care | Payer: PPO | Source: Ambulatory Visit | Attending: Internal Medicine | Admitting: Internal Medicine

## 2017-10-07 ENCOUNTER — Ambulatory Visit: Payer: PPO | Admitting: Internal Medicine

## 2017-10-07 VITALS — BP 138/77 | HR 77 | Ht 63.5 in | Wt 123.4 lb

## 2017-10-07 DIAGNOSIS — J479 Bronchiectasis, uncomplicated: Secondary | ICD-10-CM

## 2017-10-07 DIAGNOSIS — R05 Cough: Secondary | ICD-10-CM | POA: Diagnosis not present

## 2017-10-07 MED ORDER — BUDESONIDE-FORMOTEROL FUMARATE 80-4.5 MCG/ACT IN AERO: INHALATION_SPRAY | RESPIRATORY_TRACT | 11 refills | Status: DC

## 2017-10-07 NOTE — Patient Instructions (Addendum)
Plan A = Automatic = symbicort 80 Take 2 puffs first thing in am and then another 2 puffs about 12 hours later.   Work on inhaler technique:  relax and gently blow all the way out then take a nice smooth deep breath back in, triggering the inhaler at same time you start breathing in.  Hold for up to 5 seconds if you can. Blow out thru nose. Rinse and gargle with water when done   Plan B = Backup Only use your albuterol as a rescue medication to be used if you can't catch your breath by resting or doing a relaxed purse lip breathing pattern.  - The less you use it, the better it will work when you need it. - Ok to use the inhaler up to 2 puffs  every 4 hours if you must but call for appointment if use goes up over your usual need - Don't leave home without it !!  (think of it like the spare tire for your car)   Plan C = Crisis - only use your albuterol nebulizer if you first try Plan B and it fails to help > ok to use the nebulizer up to every 4 hours but if start needing it regularly call for immediate appointment   For cough >  mucinex 1200 mg every 12 hours and the flutter valve as much as you can    Come to outpatient registration at [facility] (behind the ER) at 715 am 10/12/17  with nothing to eat or drink after midnight  Tuesday  for Bronchoscopy    Please remember to go to the  x-ray department downstairs in the basement  for your tests - we will call you with the results when they are available.

## 2017-10-07 NOTE — Progress Notes (Signed)
Subjective:     Patient ID: Shannon Parsons, female   DOB: [birth year]     MRN: [id number]    Brief patient profile:  7 yowf never smoker with sev bad childhood pneumonia's and asthma but seemed outgrow it until in 1970s with assoc nasal/chest congestions/ cough >    took allergy shots x 5 years and much better except for spells of head cold going into chest not related to seasons  with sev visits per year s chronic rx and prev eval by [facility] who recorded it was worse in spring and fall and rec maint rx with qvar and rec try singulair but she never did either and referred to pulmonary clinic 03/18/2016 by Dr Vicie Mutters back to pulmonary clinic     History of Present Illness  03/18/2016 1st [facility] office visit/ [facility]  On Retuximab  And prn  saba only  Chief Complaint  Patient presents with  . Pulmonary Consult    Referred by Vicie Mutters. Pt c/o cough since April 2017- prod with yellow sputm. Cough is worse about 3 hours after she wakes up in the am.   symptoms of cough/ congestion/ wheezing/ rattling x years but much worse since mid April 2017 rx with muliptle courses of abx and prednisone   Cough is worse in am's x 3 h - no flutter valve / only using saba maybe once a day, very poor insight into hx/ meds / prev instructions rec Bronchiectasis =   you have scarring of your bronchial tubes   Plan A = Automatic = Symbiocort 80 Take 2 puffs first thing in am and then another 2 puffs about 12 hours later.  Work on inhaler technique:   Plan B = Backup Only use your albuterol as a rescue medication  Plan C = Crisis - only use your albuterol nebulizer if you first try Plan B  For cough try mucinex 1200 mg every 12 hours as needed / flutter valve as much as can  GERD (REFLUX) diet   04/01/16 Cipro x 5 days    08/11/2017  acute  ov/[facility] re: obstructive bronchiectasis flare  Chief Complaint  Patient presents with  . Acute Visit    Pt c/o cough and "burning in my chest" x 3 wks.  Cough is prod with green sputum. She has taken zithromax and is currently in augmentin and pred. She was just started on Trelegy. She is using Duonebs 1-2 x daily and has not tried her proair recently.   just started trelegy x sev days prior to OV  And thinks it's helping chest "burning"and sob  Sick since 1st week in November 2018  Can't tolerate fq or cipro in past ? Gi related but thinks she's getting better p 5/7 days augmentin prescribed by PCP  Doe = MMRC3 = can't walk 100 yards even at a slow pace at a flat grade s stopping due to sob   rec For cough > mucinex OR mucinex dm up to 1200 mg every 12 hours and use flutter valve as much as you can  Whenever coughing for any reason:  Try prilosec otc 20mg   Take 30-60 min before first meal of the day and Pepcid ac (famotidine) 20 mg one @  bedtime until cough is completely gone for at least a week without the need for cough suppression GERD  Diet  Continue Trelegy for now  Nebulizer is as needed to 4 hours as needed for  Cough/ congestion/ wheezing  Augmentin  875 mg take one pill twice daily  X 10 days      10/07/2017  Extended acute ov/[facility] re: refractory cough in pt with  obstructive bronchiectasis / off retuxan since Jul 13 2017  Chief Complaint  Patient presents with  . Acute Visit    coughing up mucus, was unable to do her PFT, not taking Trelegy, still SOB a little  last rx by [facility] end of Oct 2018 with lymphoma in check  Onset present flare 1st week in Nov 2018  Last abx 10/05/17  Didn't help/ not sure what she took "nothing works now"  Worse 10am- noon not waking up with cough / decided trelegy not helping cough > copious green mucus  Thinks IVIg helped the most but not given since about a year prior to [place]  (records suggest started 04/2016 "q 2 m" but not clear it was ever given that way) Doesn't thinks she needs to use flutter valve  Not limited by breathing from desired activities  But not very active   No obvious other  patterns day to day or daytime variability or assoc excess/ purulent sputum or mucus plugs or hemoptysis or cp or chest tightness, subjective wheeze or overt sinus or hb symptoms. No unusual exposure hx or h/o childhood pna/ asthma or knowledge of premature birth.  Sleeping ok flat without nocturnal  or early am exacerbation  of respiratory  c/o's or need for noct saba. Also denies any obvious fluctuation of symptoms with weather or environmental changes or other aggravating or alleviating factors except as outlined above   Current Allergies, Complete Past Medical History, Past Surgical History, Family History, and Social History were reviewed in [employer] record.  ROS  The following are not active complaints unless bolded Hoarseness, sore throat, dysphagia, dental problems, itching, sneezing,  nasal congestion or discharge of excess mucus or purulent secretions, ear ache,   fever, chills, sweats, unintended wt loss or wt gain, classically pleuritic or exertional cp,  orthopnea pnd or leg swelling, presyncope, palpitations, abdominal pain, anorexia, nausea, vomiting, diarrhea  or change in bowel habits or change in bladder habits, change in stools or change in urine, dysuria, hematuria,  rash, arthralgias, visual complaints, headache, numbness, weakness or ataxia or problems with walking or coordination,  change in mood/affect or memory.        Current Meds  Medication Sig  . acetaminophen (TYLENOL) 325 MG tablet Take 325 mg by mouth every 6 (six) hours as needed (For pain.).   [name] amoxicillin-clavulanate (AUGMENTIN) 875-125 MG tablet Take 1 tablet by mouth 2 (two) times daily.  . Cholecalciferol (VITAMIN D) 2000 UNITS CAPS Take 2,000 capsules by mouth 2 (two) times daily.   . diazepam (VALIUM) 5 MG tablet Take 1/2 to 1  tablet 3 x / day for Tremor, Anxiety or Sleep  . hyoscyamine (LEVSIN, ANASPAZ) 0.125 MG tablet Take 1 to 2 tablets every 4 hours as needed for nausea,  cramping, bloating or diarrhea  . ipratropium-albuterol (DUONEB) 0.5-2.5 (3) MG/3ML SOLN Take 3 mLs by nebulization every 4 (four) hours as needed.  [name] levothyroxine (SYNTHROID, LEVOTHROID) 50 MCG tablet TAKE 1 TABLET BY MOUTH ONCE DAILY.  [name] lidocaine-prilocaine (EMLA) cream Apply 1 application topically as needed. Apply to [facility] one hour before access.  [name] PROAIR HFA 108 (90 Base) MCG/ACT inhaler INHALE 2 PUFFS EVERY 4 HOURS AS NEEDED INTO THE LUNGS FOR WHEEZING OR SHORTNESS OF BREATH.  . Probiotic Product (PROBIOTIC DAILY PO) Take 1 capsule  by mouth daily.  [name] Respiratory Therapy Supplies (FLUTTER) DEVI Take as directed  . [DISCONTINUED] ALPRAZolam (XANAX) 0.5 MG tablet TAKE 1/2 TO 1 TABLET BY MOUTH 3 TIMES A DAY AS NEEDED FOR ANXIETY OR SLEEP.                  Objective:   Physical Exam  amb wf very anxious with   hopleless affect   10/07/2017        123   04/21/2016         118   03/18/16 121 lb (54.885 kg)  02/19/16 120 lb (54.432 kg)  02/03/16 118 lb (53.524 kg)    Vital signs reviewed - Note on arrival 02 sats  98% on RA        HEENT: nl dentition, turbinates bilaterally, and oropharynx. Nl external ear canals without cough reflex   NECK :  without JVD/Nodes/TM/ nl carotid upstrokes bilaterally   LUNGS: no acc muscle use,  Nl contour chest with junky coarse exp rhonchi bilaterally / not localized    CV:  RRR  no s3 or murmur or increase in P2, and no edema   ABD:  soft and nontender with nl inspiratory excursion in the supine position. No bruits or organomegaly appreciated, bowel sounds nl  MS:  Nl gait/ ext warm without deformities, calf tenderness, cyanosis or clubbing No obvious joint restrictions   SKIN: warm and dry without lesions    NEURO:  alert, approp, nl sensorium with  no motor or cerebellar deficits apparent.        CXR PA and Lateral:   10/07/2017 :    I personally reviewed images and agree with radiology impression as follows:    COPD without  acute abnormality.   Assessment:

## 2017-10-09 ENCOUNTER — Encounter: Payer: Self-pay | Admitting: Internal Medicine

## 2017-10-09 NOTE — Assessment & Plan Note (Signed)
PFT's 03/2011:  FEV1 1.72 (82%), ratio 63, ++airtrapping, DLCO 108% pred AAT 2012: no s or z mutations noted. (therefore MM) 03/18/2016  After extensive coaching HFA effectiveness =   50% > try symbicort 80 2bid - flutter valve added 03/18/2016  - PC 04/01/2016  Green sputum on augmentin > rec Cipro 500 mg bid x 10 days  > could not tol/ non-specific symptoms - started IVIG  04/14/16 > improved at f/u ov 04/21/2016  - Sinus CT  04/23/16 >  Pos Evidence of acute sinus disease with frothy secretions of the frontal sinuses, bilateral sphenoid sinuses, right maxillary sinus. Lobulated soft tissue at the inferior left maxillary sinus, potentially mucous retention cyst or polyp>  Refer to ENT  > [facility]  05/06/16 neg    Flare since Nov 2018 not responding to abx suggests immune deficiency related to lymphoma or resistant bug > best way to sample would be BAL and in meantime will ask Dr Marin Olp to either consider empirically giving IVIg again or checking her levels to see if approp first   Also emphasized to her approp ways to improve mc function: symbicort low dose/ mucinex/flutter with option to add vest next.    I had an extended discussion with the patient reviewing all relevant studies completed to date and  lasting 25 minutes of a 40  minute acute extended  visit    re  severe non-specific but potentially very serious refractory respiratory symptoms of uncertain and potentially multiple  etiologies.   Each maintenance medication was reviewed in detail including most importantly the difference between maintenance and prns and under what circumstances the prns are to be triggered using an action plan format that is not reflected in the computer generated alphabetically organized AVS.    Please see AVS for specific instructions unique to this office visit that I personally wrote and verbalized to the the pt in detail and then reviewed with pt  by my nurse highlighting any changes in therapy/plan of care   recommended at today's visit.

## 2017-10-12 ENCOUNTER — Encounter (HOSPITAL_COMMUNITY)
Admission: RE | Disposition: A | Discharge status: Home / Self Care | Payer: Self-pay | Source: Ambulatory Visit | Attending: Internal Medicine

## 2017-10-12 ENCOUNTER — Ambulatory Visit (HOSPITAL_COMMUNITY)
Admission: RE | Admit: 2017-10-12 | Discharge: 2017-10-12 | Disposition: A | Discharge status: Home / Self Care | Payer: PPO | Source: Ambulatory Visit | Attending: Internal Medicine | Admitting: Internal Medicine

## 2017-10-12 DIAGNOSIS — R0981 Nasal congestion: Secondary | ICD-10-CM | POA: Insufficient documentation

## 2017-10-12 DIAGNOSIS — J209 Acute bronchitis, unspecified: Secondary | ICD-10-CM | POA: Diagnosis not present

## 2017-10-12 DIAGNOSIS — Z79899 Other long term (current) drug therapy: Secondary | ICD-10-CM | POA: Insufficient documentation

## 2017-10-12 DIAGNOSIS — J479 Bronchiectasis, uncomplicated: Secondary | ICD-10-CM | POA: Insufficient documentation

## 2017-10-12 DIAGNOSIS — R1310 Dysphagia, unspecified: Secondary | ICD-10-CM | POA: Diagnosis not present

## 2017-10-12 DIAGNOSIS — K219 Gastro-esophageal reflux disease without esophagitis: Secondary | ICD-10-CM | POA: Insufficient documentation

## 2017-10-12 DIAGNOSIS — R490 Dysphonia: Secondary | ICD-10-CM | POA: Insufficient documentation

## 2017-10-12 HISTORY — PX: VIDEO BRONCHOSCOPY: SHX5072

## 2017-10-12 SURGERY — VIDEO BRONCHOSCOPY WITHOUT FLUORO
Anesthesia: Moderate Sedation | Laterality: Bilateral

## 2017-10-12 MED ORDER — LIDOCAINE HCL 2 % EX GEL
CUTANEOUS | Status: DC | PRN
  Administered 2017-10-12: 1

## 2017-10-12 MED ORDER — SODIUM CHLORIDE 0.9 % IV SOLN
INTRAVENOUS | Status: DC
  Administered 2017-10-12: 08:00:00 via INTRAVENOUS

## 2017-10-12 MED ORDER — MEPERIDINE HCL 100 MG/ML IJ SOLN
INTRAMUSCULAR | Status: AC
  Filled 2017-10-12: qty 2

## 2017-10-12 MED ORDER — MIDAZOLAM HCL 10 MG/2ML IJ SOLN
INTRAMUSCULAR | Status: DC | PRN
  Administered 2017-10-12 (×2): 2.5 mg via INTRAVENOUS

## 2017-10-12 MED ORDER — MIDAZOLAM HCL 5 MG/ML IJ SOLN
INTRAMUSCULAR | Status: AC
  Filled 2017-10-12: qty 2

## 2017-10-12 MED ORDER — PHENYLEPHRINE HCL 0.25 % NA SOLN
NASAL | Status: DC | PRN
  Administered 2017-10-12: 2 via NASAL

## 2017-10-12 MED ORDER — MEPERIDINE HCL 25 MG/ML IJ SOLN
INTRAMUSCULAR | Status: DC | PRN
  Administered 2017-10-12: 50 mg via INTRAVENOUS

## 2017-10-12 MED ORDER — LIDOCAINE HCL 2 % EX GEL: 1.0000 "application " | Freq: Once | CUTANEOUS | Status: DC

## 2017-10-12 MED ORDER — LIDOCAINE HCL (PF) 1 % IJ SOLN
INTRAMUSCULAR | Status: DC | PRN
  Administered 2017-10-12: 6 mL

## 2017-10-12 MED ORDER — PHENYLEPHRINE HCL 0.25 % NA SOLN: 1.0000 | Freq: Four times a day (QID) | NASAL | Status: DC | PRN

## 2017-10-12 NOTE — Op Note (Signed)
Bronchoscopy Procedure Note  Date of Operation: 10/12/2017   Pre-op Diagnosis: bronchiectasis / persistent purulent sputum ? source  Post-op Diagnosis: same  Surgeon: Christinia Gully  Anesthesia: Monitored Local Anesthesia with Sedation Time Started: 0821 total versed 74mgIV/ demerol IV  Time Stopped:  0840  Operation: Video Flexible fiberoptic bronchoscopy, diagnostic   Findings: minimal mucoid secretions L > R with nl mucosa to subsegmental level  Specimen: BAL Lingula  Estimated Blood Loss: non  Complications: none  Indications and History: See updated H and P same date. The risks, benefits, complications, treatment options and expected outcomes were discussed with the patient.  The possibilities of reaction to medication, pulmonary aspiration, perforation of a viscus, bleeding, failure to diagnose a condition and creating a complication requiring transfusion or operation were discussed with the patient who freely signed the consent.    Description of Procedure: The patient was re-examined in the bronchoscopy suite and the site of surgery properly noted/marked.  The patient was identified  and the procedure verified as Flexible Fiberoptic Bronchoscopy.  A Time Out was held and the above information confirmed.   After the induction of topical nasopharyngeal anesthesia, the patient was positioned  and the bronchoscope was passed through the R  naris. The vocal cords were visualized and  1% buffered lidocaine 5 ml was topically placed onto the cords. The cords were nl. The scope was then passed into the trachea.  1% buffered lidocaine given topically. Airways inspected bilaterally to the subsegmental level with the following findings:  minimal mucoid secretions L > R with nl mucosa to subsegmental level  Interventions:  BAL Lingula x 120cc with blood tinged but not purulent return      The Patient was taken to the Endoscopy Recovery area in satisfactory condition.  Attestation: I  performed the procedure.  MChristinia Gully MD Pulmonary and CDiamond [phone] 5:30 PM or weekends, call [phone]

## 2017-10-12 NOTE — Progress Notes (Signed)
Video bronchoscopy performed Intervention bronchial washings Pt tolerated well  Kemia Wendel David RRT  

## 2017-10-12 NOTE — Discharge Instructions (Signed)
Flexible Bronchoscopy, Care After These instructions give you information on caring for yourself after your procedure. Your doctor may also give you more specific instructions. Call your doctor if you have any problems or questions after your procedure. Follow these instructions at home:  Do not eat or drink anything for 2 hours after your procedure. If you try to eat or drink before the medicine wears off, food or drink could go into your lungs. You could also burn yourself.  After 2 hours have passed and when you can cough and gag normally, you may eat soft food and drink liquids slowly.  The day after the test, you may eat your normal diet.  You may do your normal activities.  Keep all doctor visits. Get help right away if:  You get more and more short of breath.  You get light-headed.  You feel like you are going to pass out (faint).  You have chest pain.  You have new problems that worry you.  You cough up more than a little blood.  You cough up more blood than before. This information is not intended to replace advice given to you by your health care provider. Make sure you discuss any questions you have with your health care provider. Document Released: 06/27/2009 Document Revised: 02/05/2016 Document Reviewed: 05/04/2013 Elsevier Interactive Patient Education  2017 [place] not eat or drink until after 1030 today 10/12/17

## 2017-10-12 NOTE — H&P (Signed)
Patient ID: Shannon Parsons, female   DOB: 31-Aug-1941     MRN: [id number]    Brief patient profile:  57 yowf never smoker with sev bad childhood pneumonia's and asthma but seemed outgrow it until in 1970s with assoc nasal/chest congestions/ cough >    took allergy shots x 5 years and much better except for spells of head cold going into chest not related to seasons  with sev visits per year s chronic rx and prev eval by Clance who recorded it was worse in spring and fall and rec maint rx with qvar and rec try singulair but she never did either and referred to pulmonary clinic 03/18/2016 by Dr Vicie Mutters back to pulmonary clinic     History of Present Illness  03/18/2016 1st [facility] office visit/ Shannon Parsons  On Retuximab  And prn  saba only      Chief Complaint  Patient presents with  . Pulmonary Consult    Referred by Vicie Mutters. Pt c/o cough since April 2017- prod with yellow sputm. Cough is worse about 3 hours after she wakes up in the am.   symptoms of cough/ congestion/ wheezing/ rattling x years but much worse since mid April 2017 rx with muliptle courses of abx and prednisone   Cough is worse in am's x 3 h - no flutter valve / only using saba maybe once a day, very poor insight into hx/ meds / prev instructions rec Bronchiectasis =   you have scarring of your bronchial tubes   Plan A = Automatic = Symbiocort 80 Take 2 puffs first thing in am and then another 2 puffs about 12 hours later.  Work on inhaler technique:   Plan B = Backup Only use your albuterol as a rescue medication  Plan C = Crisis - only use your albuterol nebulizer if you first try Plan B  For cough try mucinex 1200 mg every 12 hours as needed / flutter valve as much as can  GERD (REFLUX) diet   04/01/16 Cipro x 5 days    08/11/2017  acute  ov/Shannon Parsons re: obstructive bronchiectasis flare      Chief Complaint  Patient presents with  . Acute Visit    Pt c/o cough and "burning in my chest" x 3 wks.  Cough is prod with green sputum. She has taken zithromax and is currently in augmentin and pred. She was just started on Trelegy. She is using Duonebs 1-2 x daily and has not tried her proair recently.   just started trelegy x sev days prior to OV  And thinks it's helping chest "burning"and sob  Sick since 1st week in November 2018  Can't tolerate fq or cipro in past ? Gi related but thinks she's getting better p 5/7 days augmentin prescribed by PCP  Doe = MMRC3 = can't walk 100 yards even at a slow pace at a flat grade s stopping due to sob   rec For cough > mucinex OR mucinex dm up to 1200 mg every 12 hours and use flutter valve as much as you can  Whenever coughing for any reason:  Try prilosec otc 22m  Take 30-60 min before first meal of the day and Pepcid ac (famotidine) 20 mg one @  bedtime until cough is completely gone for at least a week without the need for cough suppression GERD  Diet  Continue Trelegy for now  Nebulizer is as needed to 4 hours as needed for  Cough/ congestion/  wheezing  Augmentin 875 mg take one pill twice daily  X 10 days      10/07/2017  Extended acute ov/Shannon Parsons re: refractory cough in pt with  obstructive bronchiectasis / off retuxan since Jul 13 2017      Chief Complaint  Patient presents with  . Acute Visit    coughing up mucus, was unable to do her PFT, not taking Trelegy, still SOB a little  last rx by [facility] end of Oct 2018 with lymphoma in check  Onset present flare 1st week in Nov 2018  Last abx 10/05/17  Didn't help/ not sure what she took "nothing works now"  Worse 10am- noon not waking up with cough / decided trelegy not helping cough > copious green mucus  Thinks IVIg helped the most but not given since about a year prior to [place]  (records suggest started 04/2016 "q 2 m" but not clear it was ever given that way) Doesn't thinks she needs to use flutter valve  Not limited by breathing from desired activities  But not very active   No obvious  other patterns day to day or daytime variability or assoc excess/ purulent sputum or mucus plugs or hemoptysis or cp or chest tightness, subjective wheeze or overt sinus or hb symptoms. No unusual exposure hx or h/o childhood pna/ asthma or knowledge of premature birth.  Sleeping ok flat without nocturnal  or early am exacerbation  of respiratory  c/o's or need for noct saba. Also denies any obvious fluctuation of symptoms with weather or environmental changes or other aggravating or alleviating factors except as outlined above   Current Allergies, Complete Past Medical History, Past Surgical History, Family History, and Social History were reviewed in [employer] record.  ROS  The following are not active complaints unless bolded Hoarseness, sore throat, dysphagia, dental problems, itching, sneezing,  nasal congestion or discharge of excess mucus or purulent secretions, ear ache,   fever, chills, sweats, unintended wt loss or wt gain, classically pleuritic or exertional cp,  orthopnea pnd or leg swelling, presyncope, palpitations, abdominal pain, anorexia, nausea, vomiting, diarrhea  or change in bowel habits or change in bladder habits, change in stools or change in urine, dysuria, hematuria,  rash, arthralgias, visual complaints, headache, numbness, weakness or ataxia or problems with walking or coordination,  change in mood/affect or memory.            Current Meds  Medication Sig  . acetaminophen (TYLENOL) 325 MG tablet Take 325 mg by mouth every 6 (six) hours as needed (For pain.).   [name] amoxicillin-clavulanate (AUGMENTIN) 875-125 MG tablet Take 1 tablet by mouth 2 (two) times daily.  . Cholecalciferol (VITAMIN D) 2000 UNITS CAPS Take 2,000 capsules by mouth 2 (two) times daily.   . diazepam (VALIUM) 5 MG tablet Take 1/2 to 1  tablet 3 x / day for Tremor, Anxiety or Sleep  . hyoscyamine (LEVSIN, ANASPAZ) 0.125 MG tablet Take 1 to 2 tablets every 4 hours as needed for  nausea, cramping, bloating or diarrhea  . ipratropium-albuterol (DUONEB) 0.5-2.5 (3) MG/3ML SOLN Take 3 mLs by nebulization every 4 (four) hours as needed.  [name] levothyroxine (SYNTHROID, LEVOTHROID) 50 MCG tablet TAKE 1 TABLET BY MOUTH ONCE DAILY.  [name] lidocaine-prilocaine (EMLA) cream Apply 1 application topically as needed. Apply to [facility] one hour before access.  [name] PROAIR HFA 108 (90 Base) MCG/ACT inhaler INHALE 2 PUFFS EVERY 4 HOURS AS NEEDED INTO THE LUNGS FOR WHEEZING OR SHORTNESS OF  BREATH.  . Probiotic Product (PROBIOTIC DAILY PO) Take 1 capsule by mouth daily.  [name] Respiratory Therapy Supplies (FLUTTER) DEVI Take as directed  . [DISCONTINUED] ALPRAZolam (XANAX) 0.5 MG tablet TAKE 1/2 TO 1 TABLET BY MOUTH 3 TIMES A DAY AS NEEDED FOR ANXIETY OR SLEEP.                  Objective:   Physical Exam  amb wf very anxious with   hopleless affect   10/07/2017        123      04/21/2016         118   03/18/16 121 lb (54.885 kg)  02/19/16 120 lb (54.432 kg)  02/03/16 118 lb (53.524 kg)    Vital signs reviewed - Note on arrival 02 sats  98% on RA        HEENT: nl dentition, turbinates bilaterally, and oropharynx. Nl external ear canals without cough reflex   NECK :  without JVD/Nodes/TM/ nl carotid upstrokes bilaterally   LUNGS: no acc muscle use,  Nl contour chest with junky coarse exp rhonchi bilaterally / not localized    CV:  RRR  no s3 or murmur or increase in P2, and no edema   ABD:  soft and nontender with nl inspiratory excursion in the supine position. No bruits or organomegaly appreciated, bowel sounds nl  MS:  Nl gait/ ext warm without deformities, calf tenderness, cyanosis or clubbing No obvious joint restrictions   SKIN: warm and dry without lesions    NEURO:  alert, approp, nl sensorium with  no motor or cerebellar deficits apparent.        CXR PA and Lateral:   10/07/2017 :    I personally reviewed images and agree with radiology  impression as follows:    COPD without acute abnormality.   Assessment:            Assessment & Plan Note by Tanda Rockers, MD at 10/09/2017 6:05 AM   Author: Tanda Rockers, MD Author Type: Physician Filed: 10/09/2017 6:08 AM  Note Status: Written Cosign: Cosign Not Required Encounter Date: 10/07/2017  Problem: Obstructive bronchiectasis ([place])  Editor: Tanda Rockers, MD (Physician)    PFT's 03/2011:  FEV1 1.72 (82%), ratio 63, ++airtrapping, DLCO 108% pred AAT 2012: no s or z mutations noted. (therefore MM) 03/18/2016  After extensive coaching HFA effectiveness =   50% > try symbicort 80 2bid - flutter valve added 03/18/2016  - PC 04/01/2016  Green sputum on augmentin > rec Cipro 500 mg bid x 10 days  > could not tol/ non-specific symptoms - started IVIG  04/14/16 > improved at f/u ov 04/21/2016  - Sinus CT  04/23/16 >  Pos Evidence of acute sinus disease with frothy secretions of the frontal sinuses, bilateral sphenoid sinuses, right maxillary sinus. Lobulated soft tissue at the inferior left maxillary sinus, potentially mucous retention cyst or polyp>  Refer to ENT  > Rosen  05/06/16 neg    Flare since Nov 2018 not responding to abx suggests immune deficiency related to lymphoma or resistant bug > best way to sample would be BAL and in meantime will ask Dr Marin Olp to either consider empirically giving IVIg again or checking her levels to see if approp first   Also emphasized to her approp ways to improve mc function: symbicort low dose/ mucinex/flutter with option to add vest next.    I had an extended discussion with the patient reviewing all relevant  studies completed to date and  lasting 25 minutes of a 40  minute acute extended  visit    re  severe non-specific but potentially very serious refractory respiratory symptoms of uncertain and potentially multiple  etiologies.   Each maintenance medication was reviewed in detail including most importantly the difference  between maintenance and prns and under what circumstances the prns are to be triggered using an action plan format that is not reflected in the computer generated alphabetically organized AVS.    Please see AVS for specific instructions unique to this office visit that I personally wrote and verbalized to the the pt in detail and then reviewed with pt  by my nurse highlighting any changes in therapy/plan of care  recommended at today's visit.        Patient Instructions by Tanda Rockers, MD at 10/07/2017 1:45 PM   Author: Tanda Rockers, MD Author Type: Physician Filed: 10/07/2017 2:32 PM  Note Status: Addendum Cosign: Cosign Not Required Encounter Date: 10/07/2017  Editor: Tanda Rockers, MD (Physician)  Prior Versions: 1. Tanda Rockers, MD (Physician) at 10/07/2017 2:27 PM - Signed    Plan A = Automatic = symbicort 80 Take 2 puffs first thing in am and then another 2 puffs about 12 hours later.   Work on inhaler technique:  relax and gently blow all the way out then take a nice smooth deep breath back in, triggering the inhaler at same time you start breathing in.  Hold for up to 5 seconds if you can. Blow out thru nose. Rinse and gargle with water when done   Plan B = Backup Only use your albuterol as a rescue medication to be used if you can't catch your breath by resting or doing a relaxed purse lip breathing pattern.  - The less you use it, the better it will work when you need it. - Ok to use the inhaler up to 2 puffs  every 4 hours if you must but call for appointment if use goes up over your usual need - Don't leave home without it !!  (think of it like the spare tire for your car)   Plan C = Crisis - only use your albuterol nebulizer if you first try Plan B and it fails to help > ok to use the nebulizer up to every 4 hours but if start needing it regularly call for immediate appointment   For cough >  mucinex 1200 mg every 12 hours and the flutter valve as much as you  can    Come to outpatient registration at [facility] (behind the ER) at 715 am 10/12/17  with nothing to eat or drink after midnight  Tuesday  for Bronchoscopy    .10/12/2017 Day of FOB/ no change hx or exam/ no purulent sputum this am but usually mornings are the worst/ no abx since ov    Christinia Gully, MD Pulmonary and [place] [phone] After 5:30 PM or weekends, use [facility] [phone]     .

## 2017-10-13 ENCOUNTER — Encounter (HOSPITAL_COMMUNITY): Payer: Self-pay | Admitting: Internal Medicine

## 2017-10-13 LAB — ACID FAST SMEAR (AFB): ACID FAST SMEAR - AFSCU2: NEGATIVE

## 2017-10-13 LAB — ACID FAST SMEAR (AFB, MYCOBACTERIA)

## 2017-10-13 LAB — PNEUMOCYSTIS JIROVECI SMEAR BY DFA: Pneumocystis jiroveci Ag: NEGATIVE

## 2017-10-14 ENCOUNTER — Telehealth: Payer: Self-pay | Admitting: Internal Medicine

## 2017-10-14 DIAGNOSIS — R05 Cough: Secondary | ICD-10-CM

## 2017-10-14 DIAGNOSIS — R059 Cough, unspecified: Secondary | ICD-10-CM

## 2017-10-14 DIAGNOSIS — J479 Bronchiectasis, uncomplicated: Secondary | ICD-10-CM

## 2017-10-14 LAB — CULTURE, RESPIRATORY W GRAM STAIN: Culture: NORMAL

## 2017-10-14 NOTE — Telephone Encounter (Signed)
Will forward to MW per his req

## 2017-10-14 NOTE — Telephone Encounter (Signed)
Informed pt studies neg so far and no source for mucus / infection so rec sinus CT next and consider IV Ig repeat rx per Dr Marin Olp

## 2017-10-14 NOTE — Telephone Encounter (Signed)
Order for sinus ct was sent to [facility]

## 2017-10-18 ENCOUNTER — Ambulatory Visit (INDEPENDENT_AMBULATORY_CARE_PROVIDER_SITE_OTHER)
Admission: RE | Admit: 2017-10-18 | Discharge: 2017-10-18 | Disposition: A | Discharge status: Home / Self Care | Payer: PPO | Source: Ambulatory Visit | Attending: Internal Medicine | Admitting: Internal Medicine

## 2017-10-18 DIAGNOSIS — J329 Chronic sinusitis, unspecified: Secondary | ICD-10-CM | POA: Diagnosis not present

## 2017-10-18 DIAGNOSIS — R059 Cough, unspecified: Secondary | ICD-10-CM

## 2017-10-18 DIAGNOSIS — R05 Cough: Secondary | ICD-10-CM | POA: Diagnosis not present

## 2017-10-18 DIAGNOSIS — J479 Bronchiectasis, uncomplicated: Secondary | ICD-10-CM | POA: Diagnosis not present

## 2017-10-19 ENCOUNTER — Other Ambulatory Visit: Payer: Self-pay | Admitting: Internal Medicine

## 2017-10-19 DIAGNOSIS — J320 Chronic maxillary sinusitis: Secondary | ICD-10-CM | POA: Insufficient documentation

## 2017-10-19 NOTE — Progress Notes (Signed)
Spoke with pt and notified of results per Dr. Melvyn Novas. Pt verbalized understanding and denied any questions. Order sent to [facility] for referral to ENT

## 2017-10-20 ENCOUNTER — Telehealth: Payer: Self-pay | Admitting: *Deleted

## 2017-10-20 DIAGNOSIS — Z8572 Personal history of non-Hodgkin lymphomas: Secondary | ICD-10-CM | POA: Insufficient documentation

## 2017-10-20 DIAGNOSIS — Z8579 Personal history of other malignant neoplasms of lymphoid, hematopoietic and related tissues: Secondary | ICD-10-CM | POA: Diagnosis not present

## 2017-10-20 DIAGNOSIS — J329 Chronic sinusitis, unspecified: Secondary | ICD-10-CM | POA: Diagnosis not present

## 2017-10-20 NOTE — Telephone Encounter (Signed)
Patient calling the office stating she "Feels very odd". She has non-specific complaints. She states she has taken her blood pressure three times. They're 140's/80's with her pulse between 95 and 106. She has no specific symptoms she can list.  Reviewed vitals with the patient. They are elevated but nothing acutely urgent. Explained that there are several illness going through the community and she may be coming down with an illness. Explained that she needed to go to the ED if she continued to feel 'off'. She states she will rest and see how she feels at home. She has a friend with her at home who will stay with her if she needs assistance. She understands to seek emergent care if she thinks it necessary.

## 2017-10-27 ENCOUNTER — Other Ambulatory Visit: Payer: Self-pay | Admitting: Family

## 2017-10-27 ENCOUNTER — Other Ambulatory Visit: Payer: Self-pay

## 2017-10-27 ENCOUNTER — Inpatient Hospital Stay: Payer: PPO

## 2017-10-27 ENCOUNTER — Inpatient Hospital Stay: Payer: PPO | Attending: Hematology & Oncology | Admitting: Hematology & Oncology

## 2017-10-27 ENCOUNTER — Encounter: Payer: Self-pay | Admitting: Hematology & Oncology

## 2017-10-27 VITALS — BP 139/66 | HR 89 | Temp 98.2°F | Resp 18

## 2017-10-27 DIAGNOSIS — Z792 Long term (current) use of antibiotics: Secondary | ICD-10-CM | POA: Insufficient documentation

## 2017-10-27 DIAGNOSIS — Z9225 Personal history of immunosupression therapy: Secondary | ICD-10-CM | POA: Insufficient documentation

## 2017-10-27 DIAGNOSIS — R5383 Other fatigue: Secondary | ICD-10-CM | POA: Diagnosis not present

## 2017-10-27 DIAGNOSIS — C8310 Mantle cell lymphoma, unspecified site: Secondary | ICD-10-CM | POA: Diagnosis not present

## 2017-10-27 DIAGNOSIS — C8312 Mantle cell lymphoma, intrathoracic lymph nodes: Secondary | ICD-10-CM

## 2017-10-27 DIAGNOSIS — D801 Nonfamilial hypogammaglobulinemia: Secondary | ICD-10-CM

## 2017-10-27 DIAGNOSIS — C8313 Mantle cell lymphoma, intra-abdominal lymph nodes: Secondary | ICD-10-CM

## 2017-10-27 DIAGNOSIS — Z79899 Other long term (current) drug therapy: Secondary | ICD-10-CM | POA: Diagnosis not present

## 2017-10-27 DIAGNOSIS — J0100 Acute maxillary sinusitis, unspecified: Secondary | ICD-10-CM | POA: Insufficient documentation

## 2017-10-27 DIAGNOSIS — J449 Chronic obstructive pulmonary disease, unspecified: Secondary | ICD-10-CM

## 2017-10-27 DIAGNOSIS — J4489 Other specified chronic obstructive pulmonary disease: Secondary | ICD-10-CM

## 2017-10-27 DIAGNOSIS — J479 Bronchiectasis, uncomplicated: Secondary | ICD-10-CM | POA: Diagnosis not present

## 2017-10-27 DIAGNOSIS — R5381 Other malaise: Secondary | ICD-10-CM

## 2017-10-27 LAB — CBC WITH DIFFERENTIAL (CANCER CENTER ONLY)
BASOS ABS: 0.1 10*3/uL (ref 0.0–0.1)
Basophils Relative: 1 %
EOS ABS: 0.2 10*3/uL (ref 0.0–0.5)
EOS PCT: 2 %
HCT: 40.6 % (ref 34.8–46.6)
Hemoglobin: 13.6 g/dL (ref 11.6–15.9)
LYMPHS ABS: 0.8 10*3/uL — AB (ref 0.9–3.3)
Lymphocytes Relative: 8 %
MCH: 29.9 pg (ref 26.0–34.0)
MCHC: 33.5 g/dL (ref 32.0–36.0)
MCV: 89.2 fL (ref 81.0–101.0)
Monocytes Absolute: 0.8 10*3/uL (ref 0.1–0.9)
Monocytes Relative: 8 %
Neutro Abs: 8.2 10*3/uL — ABNORMAL HIGH (ref 1.5–6.5)
Neutrophils Relative %: 81 %
PLATELETS: 286 10*3/uL (ref 145–400)
RBC: 4.55 MIL/uL (ref 3.70–5.32)
RDW: 13.5 % (ref 11.1–15.7)
WBC: 10 10*3/uL (ref 3.9–10.0)

## 2017-10-27 LAB — CMP (CANCER CENTER ONLY)
ALBUMIN: 3.7 g/dL (ref 3.5–5.0)
ALK PHOS: 65 U/L (ref 26–84)
ALT: 18 U/L (ref 10–47)
AST: 33 U/L (ref 11–38)
Anion gap: 10 (ref 5–15)
BUN: 11 mg/dL (ref 7–22)
CALCIUM: 9.9 mg/dL (ref 8.0–10.3)
CO2: 29 mmol/L (ref 18–33)
CREATININE: 0.9 mg/dL (ref 0.60–1.20)
Chloride: 106 mmol/L (ref 98–108)
Glucose, Bld: 88 mg/dL (ref 73–118)
Potassium: 3.9 mmol/L (ref 3.3–4.7)
SODIUM: 145 mmol/L (ref 128–145)
TOTAL PROTEIN: 6.3 g/dL — AB (ref 6.4–8.1)
Total Bilirubin: 0.6 mg/dL (ref 0.2–1.6)

## 2017-10-27 LAB — LACTATE DEHYDROGENASE: LDH: 271 U/L — ABNORMAL HIGH (ref 125–245)

## 2017-10-27 MED ORDER — METHYLPREDNISOLONE SODIUM SUCC 125 MG IJ SOLR
INTRAMUSCULAR | Status: AC
  Filled 2017-10-27: qty 2

## 2017-10-27 MED ORDER — HEPARIN SOD (PORK) LOCK FLUSH 100 UNIT/ML IV SOLN
500.0000 [IU] | Freq: Once | INTRAVENOUS | Status: AC
  Administered 2017-10-27: 500 [IU] via INTRAVENOUS
  Filled 2017-10-27: qty 5

## 2017-10-27 MED ORDER — IMMUNE GLOBULIN (HUMAN) 5 GM/100ML IV SOLN: 40.0000 g | Freq: Once | INTRAVENOUS | Status: DC

## 2017-10-27 MED ORDER — FAMOTIDINE IN NACL 20-0.9 MG/50ML-% IV SOLN
INTRAVENOUS | Status: AC
  Filled 2017-10-27: qty 100

## 2017-10-27 MED ORDER — SODIUM CHLORIDE 0.9 % IV SOLN
40.0000 mg | Freq: Once | INTRAVENOUS | Status: AC
  Administered 2017-10-27: 40 mg via INTRAVENOUS
  Filled 2017-10-27: qty 4

## 2017-10-27 MED ORDER — DIPHENHYDRAMINE HCL 25 MG PO CAPS
25.0000 mg | ORAL_CAPSULE | Freq: Once | ORAL | Status: AC
  Administered 2017-10-27: 25 mg via ORAL

## 2017-10-27 MED ORDER — ACETAMINOPHEN 325 MG PO TABS
ORAL_TABLET | ORAL | Status: AC
  Filled 2017-10-27: qty 2

## 2017-10-27 MED ORDER — METHYLPREDNISOLONE SODIUM SUCC 125 MG IJ SOLR
125.0000 mg | Freq: Once | INTRAMUSCULAR | Status: AC
  Administered 2017-10-27: 125 mg via INTRAVENOUS

## 2017-10-27 MED ORDER — IMMUNE GLOBULIN (HUMAN) 20 GM/200ML IV SOLN
40.0000 g | Freq: Once | INTRAVENOUS | Status: AC
  Administered 2017-10-27: 40 g via INTRAVENOUS
  Filled 2017-10-27: qty 400

## 2017-10-27 MED ORDER — IPRATROPIUM-ALBUTEROL 0.5-2.5 (3) MG/3ML IN SOLN
RESPIRATORY_TRACT | Status: AC
  Filled 2017-10-27: qty 3

## 2017-10-27 MED ORDER — IPRATROPIUM-ALBUTEROL 0.5-2.5 (3) MG/3ML IN SOLN
3.0000 mL | Freq: Four times a day (QID) | RESPIRATORY_TRACT | Status: DC
  Administered 2017-10-27: 3 mL via RESPIRATORY_TRACT

## 2017-10-27 MED ORDER — ZOLEDRONIC ACID 5 MG/100ML IV SOLN: 5.0000 mg | Freq: Once | INTRAVENOUS | Status: DC

## 2017-10-27 MED ORDER — DIPHENHYDRAMINE HCL 25 MG PO CAPS
ORAL_CAPSULE | ORAL | Status: AC
  Filled 2017-10-27: qty 1

## 2017-10-27 MED ORDER — IPRATROPIUM-ALBUTEROL 0.5-2.5 (3) MG/3ML IN SOLN: 3.0000 mL | Freq: Four times a day (QID) | RESPIRATORY_TRACT | Status: DC

## 2017-10-27 MED ORDER — SODIUM CHLORIDE 0.9% FLUSH
10.0000 mL | INTRAVENOUS | Status: DC | PRN
  Administered 2017-10-27: 10 mL via INTRAVENOUS
  Filled 2017-10-27: qty 10

## 2017-10-27 MED ORDER — ACETAMINOPHEN 325 MG PO TABS
650.0000 mg | ORAL_TABLET | Freq: Once | ORAL | Status: AC
  Administered 2017-10-27: 650 mg via ORAL

## 2017-10-27 NOTE — Patient Instructions (Signed)

## 2017-10-27 NOTE — Patient Instructions (Signed)

## 2017-10-27 NOTE — Progress Notes (Signed)
Hematology and Oncology Follow Up Visit  Shannon Parsons [id number] 1940/09/15 77 y.o. 10/27/2017   Principle Diagnosis:   Mantle cell lymphoma (MCIPI = 5)  Thromboembolic event of the right leg  Acquired Hypogammaglobulinemia  Bronchiectasis  Current Therapy:    Rituxan - maintanence - s/p cycle #10 - to finish in 07/2017  IVIG - q  month -restart on 10/27/2017     Interim History:  Ms.  Shannon Parsons is back for follow-up.  It looks like she is having issues with bronchiectasis.  She is having a lot of issues with cough.  She is coughing up purulent mucus.  She actually underwent a bronchoscopy.  The bronchoscopy material that was obtained was all negative for infection.  She is seeing ENT.  She had a CT scan done of her sinuses.  She does have maxillary sinusitis.  She is had no fever.  She has had no bleeding.  She has not noted any swollen lymph nodes.  There is been no change in bowel or bladder habits.  Her pulmonologist felt that IVIG would be helpful.  Her last IgG level was 615.  This was back in December 2017.  Currently, her performance status is ECOG 1.    Medications:  Current Outpatient Medications:  .  acetaminophen (TYLENOL) 325 MG tablet, Take 325 mg by mouth every 6 (six) hours as needed for moderate pain (For pain.). , Disp: , Rfl:  .  amoxicillin-clavulanate (AUGMENTIN) 875-125 MG tablet, Take 1 tablet by mouth 2 (two) times daily. (Patient not taking: Reported on 10/10/2017), Disp: 20 tablet, Rfl: 0 .  budesonide-formoterol (SYMBICORT) 80-4.5 MCG/ACT inhaler, Take 2 puffs first thing in am and then another 2 puffs about 12 hours later. (Patient taking differently: Inhale 2 puffs into the lungs 2 (two) times daily. ), Disp: 1 Inhaler, Rfl: 11 .  Cholecalciferol (VITAMIN D3) 5000 units CAPS, Take 10,000 Units by mouth daily., Disp: , Rfl:  .  Dextromethorphan-Guaifenesin (MUCINEX DM) 30-600 MG TB12, Take 1 tablet by mouth 2 (two) times daily., Disp: , Rfl:    .  diazepam (VALIUM) 5 MG tablet, Take 1/2 to 1  tablet 3 x / day for Tremor, Anxiety or Sleep (Patient taking differently: Take 2.5 mg by mouth at bedtime as needed for anxiety or sedation. Take 1/2 to 1  tablet 3 x / day for Tremor, Anxiety or Sleep), Disp: 270 tablet, Rfl: 1 .  hyoscyamine (LEVSIN, ANASPAZ) 0.125 MG tablet, Take 1 to 2 tablets every 4 hours as needed for nausea, cramping, bloating or diarrhea (Patient taking differently: Take 0.125 mg by mouth every 4 (four) hours as needed for cramping. ), Disp: 90 tablet, Rfl: 11 .  ipratropium-albuterol (DUONEB) 0.5-2.5 (3) MG/3ML SOLN, Take 3 mLs by nebulization every 4 (four) hours as needed (heaviness in chest). , Disp: , Rfl:  .  levothyroxine (SYNTHROID, LEVOTHROID) 50 MCG tablet, TAKE 1 TABLET BY MOUTH ONCE DAILY., Disp: 90 tablet, Rfl: 4 .  lidocaine-prilocaine (EMLA) cream, Apply 1 application topically as needed. Apply to [facility] one hour before access., Disp: 30 g, Rfl: 3 .  PROAIR HFA 108 (90 Base) MCG/ACT inhaler, INHALE 2 PUFFS EVERY 4 HOURS AS NEEDED INTO THE LUNGS FOR WHEEZING OR SHORTNESS OF BREATH., Disp: 48 g, Rfl: 1 .  Probiotic Product (PROBIOTIC DAILY PO), Take 1 capsule by mouth daily., Disp: , Rfl:  .  Respiratory Therapy Supplies (FLUTTER) DEVI, Take as directed, Disp: 1 each, Rfl: 0 No current facility-administered medications for this visit.  Facility-Administered Medications Ordered in Other Visits:  .  famotidine (PEPCID) 40 mg in sodium chloride 0.9 % 50 mL IVPB, 40 mg, Intravenous, Once, Volanda Napoleon, MD, 40 mg at 10/27/17 0947 .  Immune Globulin 10% (PRIVIGEN) IV infusion 40 g, 40 g, Intravenous, Once, [facility], Rudell Cobb, MD .  Immune Globulin 5% (OCTAGAM) IV infusion 40 g, 40 g, Intravenous, Once, En[facility], Peter R, MD .  ipratropium-albuterol (DUONEB) 0.5-2.5 (3) MG/3ML nebulizer solution 3 mL, 3 mL, Nebulization, Q6H, [place], Sarah M, NP .  ipratropium-albuterol (DUONEB) 0.5-2.5 (3) MG/3ML nebulizer solution  3 mL, 3 mL, Nebulization, Q6H, [place], Sarah M, NP, 3 mL at 10/27/17 0945 .  zoledronic acid (RECLAST) injection 5 mg, 5 mg, Intravenous, Once, Ennever, Rudell Cobb, MD  Allergies:  Allergies  Allergen Reactions  . Alendronate Other (See Comments)    "made me choke" "made me choke" chokes  . Risedronate Sodium Other (See Comments)    "made me choke"  "made me choke"   . Sulfa Antibiotics Other (See Comments)    Doesn't remember   . Sulfasalazine Other (See Comments)    Doesn't remember   . Tetracyclines & Related Rash    Past Medical History, Surgical history, Social history, and Family History were reviewed and updated.  Review of Systems: Review of Systems  Constitutional: Positive for malaise/fatigue.  HENT: Positive for congestion.   Eyes: Negative.   Respiratory: Positive for shortness of breath and wheezing.   Cardiovascular: Negative.   Gastrointestinal: Negative.   Genitourinary: Negative.   Musculoskeletal: Negative.   Skin: Negative.   Neurological: Negative.   Endo/Heme/Allergies: Negative.   Psychiatric/Behavioral: Negative.     Physical Exam:  vitals were not taken for this visit.   Physical Exam  Constitutional: She is oriented to person, place, and time.  HENT:  Head: Normocephalic and atraumatic.  Mouth/Throat: Oropharynx is clear and moist.  Eyes: EOM are normal. Pupils are equal, round, and reactive to light.  Neck: Normal range of motion.  Cardiovascular: Normal rate, regular rhythm and normal heart sounds.  Pulmonary/Chest: Effort normal and breath sounds normal.  Abdominal: Soft. Bowel sounds are normal.  Musculoskeletal: Normal range of motion. She exhibits no edema, tenderness or deformity.  Lymphadenopathy:    She has no cervical adenopathy.  Neurological: She is alert and oriented to person, place, and time.  Skin: Skin is warm and dry. No rash noted. No erythema.  Psychiatric: She has a normal mood and affect. Her behavior is normal.  Judgment and thought content normal.  Vitals reviewed.    Lab Results  Component Value Date   WBC 10.0 10/27/2017   HGB 13.8 09/15/2017   HCT 40.6 10/27/2017   MCV 89.2 10/27/2017   PLT 286 10/27/2017     Chemistry      Component Value Date/Time   NA 145 10/27/2017 0818   NA 141 07/13/2017 0743   NA 138 08/01/2015 1124   K 3.9 10/27/2017 0818   K 4.2 07/13/2017 0743   K 4.0 08/01/2015 1124   CL 106 10/27/2017 0818   CL 105 07/13/2017 0743   CO2 29 10/27/2017 0818   CO2 27 07/13/2017 0743   CO2 28 08/01/2015 1124   BUN 11 10/27/2017 0818   BUN 16 07/13/2017 0743   BUN 10.4 08/01/2015 1124   CREATININE 0.90 10/27/2017 0818   CREATININE 0.98 (H) 09/15/2017 1518   CREATININE 0.8 08/01/2015 1124      Component Value Date/Time   CALCIUM 9.9 10/27/2017  0818   CALCIUM 9.5 07/13/2017 0743   CALCIUM 9.6 08/01/2015 1124   ALKPHOS 65 10/27/2017 0818   ALKPHOS 68 07/13/2017 0743   ALKPHOS 88 08/01/2015 1124   AST 33 10/27/2017 0818   AST 23 08/01/2015 1124   ALT 18 10/27/2017 0818   ALT 19 07/13/2017 0743   ALT 12 08/01/2015 1124   BILITOT 0.6 10/27/2017 0818   BILITOT <0.30 08/01/2015 1124      Impression and Plan: Ms. Swor is 77 year old white female. She has mantle cell lymphoma. Again, by the [facility] she is an intermediate risk.  She received 2 different courses of chemotherapy. She could not tolerate R-CHOP with Velcade substituting for vincristine. This however was incredibly helpful. We finished up with Rituxan/bendamustine. She completed this in August 2016.  We then started maintenance Rituxan. She started this in November 2016.  As such, she will finish 2 years of therapy in November 2018.  I do not see any evidence of recurrent disease. Again she is at a higher risk of recurrence.  We will go ahead and give her IVIG.  I also want to give her a nebulizer.  I realize that the bronchoscopy secretions were negative for any obvious infection.  However, I just  wonder if some form of inhaled antibiotics might help.  Also sounds like she may need some kind of sinus surgery if she has bad sinusitis.  I will get her back in 1 month.  I do not think we have any issues with respect to her mantle cell lymphoma.  Volanda Napoleon, MD 2/14/201910:16 AM

## 2017-11-03 ENCOUNTER — Telehealth: Payer: Self-pay | Admitting: Internal Medicine

## 2017-11-03 NOTE — Telephone Encounter (Signed)
Ok to stop it and see if notes any change and if not leave it off It starts working again in 5 min so nothing to lose by trying without it

## 2017-11-03 NOTE — Telephone Encounter (Signed)
Spoke with pt. She is aware of MW's response. Nothing further was needed. 

## 2017-11-03 NOTE — Telephone Encounter (Signed)
Called and spoke to pt. Pt is wanting to know how long she should take Symbicort 80, that was prescribed at last OV.   MW please advise. Thanks   AVS: 10/07/17:  Instructions   Patient Instructions    Plan A = Automatic = symbicort 80 Take 2 puffs first thing in am and then another 2 puffs about 12 hours later.   Work on inhaler technique:  relax and gently blow all the way out then take a nice smooth deep breath back in, triggering the inhaler at same time you start breathing in.  Hold for up to 5 seconds if you can. Blow out thru nose. Rinse and gargle with water when done   Plan B = Backup Only use your albuterol as a rescue medication to be used if you can't catch your breath by resting or doing a relaxed purse lip breathing pattern.  - The less you use it, the better it will work when you need it. - Ok to use the inhaler up to 2 puffs  every 4 hours if you must but call for appointment if use goes up over your usual need - Don't leave home without it !!  (think of it like the spare tire for your car)   Plan C = Crisis - only use your albuterol nebulizer if you first try Plan B and it fails to help > ok to use the nebulizer up to every 4 hours but if start needing it regularly call for immediate appointment   For cough >  mucinex 1200 mg every 12 hours and the flutter valve as much as you can    Come to outpatient registration at [facility] (behind the ER) at 715 am 10/12/17  with nothing to eat or drink after midnight  Tuesday  for Bronchoscopy    Please remember to go to the  x-ray department downstairs in the basement  for your tests - we will call you with the results when they are available.            After Visit Summary (Printed 10/07/2017)

## 2017-11-04 DIAGNOSIS — J329 Chronic sinusitis, unspecified: Secondary | ICD-10-CM | POA: Diagnosis not present

## 2017-11-10 ENCOUNTER — Ambulatory Visit: Payer: PPO | Admitting: Hematology & Oncology

## 2017-11-10 ENCOUNTER — Ambulatory Visit: Payer: PPO

## 2017-11-10 ENCOUNTER — Other Ambulatory Visit: Payer: PPO

## 2017-11-10 ENCOUNTER — Other Ambulatory Visit: Payer: Self-pay | Admitting: Internal Medicine

## 2017-11-10 LAB — FUNGUS CULTURE RESULT

## 2017-11-10 LAB — FUNGAL ORGANISM REFLEX

## 2017-11-10 LAB — FUNGUS CULTURE WITH STAIN

## 2017-11-11 DIAGNOSIS — J320 Chronic maxillary sinusitis: Secondary | ICD-10-CM | POA: Diagnosis not present

## 2017-11-11 DIAGNOSIS — J329 Chronic sinusitis, unspecified: Secondary | ICD-10-CM | POA: Diagnosis not present

## 2017-11-15 ENCOUNTER — Encounter: Payer: Self-pay | Admitting: Hematology & Oncology

## 2017-11-24 ENCOUNTER — Inpatient Hospital Stay: Payer: PPO

## 2017-11-24 ENCOUNTER — Inpatient Hospital Stay: Payer: PPO | Attending: Hematology & Oncology | Admitting: Hematology & Oncology

## 2017-11-24 VITALS — BP 126/53 | HR 63 | Temp 97.7°F | Resp 20 | Wt 122.5 lb

## 2017-11-24 VITALS — BP 136/67 | HR 55 | Temp 97.9°F | Resp 18

## 2017-11-24 DIAGNOSIS — Z9221 Personal history of antineoplastic chemotherapy: Secondary | ICD-10-CM | POA: Insufficient documentation

## 2017-11-24 DIAGNOSIS — J479 Bronchiectasis, uncomplicated: Secondary | ICD-10-CM | POA: Diagnosis not present

## 2017-11-24 DIAGNOSIS — D801 Nonfamilial hypogammaglobulinemia: Secondary | ICD-10-CM

## 2017-11-24 DIAGNOSIS — Z79899 Other long term (current) drug therapy: Secondary | ICD-10-CM | POA: Diagnosis not present

## 2017-11-24 DIAGNOSIS — C8312 Mantle cell lymphoma, intrathoracic lymph nodes: Secondary | ICD-10-CM

## 2017-11-24 DIAGNOSIS — C8310 Mantle cell lymphoma, unspecified site: Secondary | ICD-10-CM

## 2017-11-24 DIAGNOSIS — R5383 Other fatigue: Secondary | ICD-10-CM | POA: Insufficient documentation

## 2017-11-24 DIAGNOSIS — Z9225 Personal history of immunosupression therapy: Secondary | ICD-10-CM

## 2017-11-24 DIAGNOSIS — R5381 Other malaise: Secondary | ICD-10-CM | POA: Diagnosis not present

## 2017-11-24 DIAGNOSIS — C8311 Mantle cell lymphoma, lymph nodes of head, face, and neck: Secondary | ICD-10-CM

## 2017-11-24 LAB — CBC WITH DIFFERENTIAL (CANCER CENTER ONLY)
Basophils Absolute: 0.1 10*3/uL (ref 0.0–0.1)
Basophils Relative: 1 %
Eosinophils Absolute: 0.2 10*3/uL (ref 0.0–0.5)
Eosinophils Relative: 2 %
HCT: 39 % (ref 34.8–46.6)
HEMOGLOBIN: 13 g/dL (ref 11.6–15.9)
Lymphocytes Relative: 12 %
Lymphs Abs: 0.8 10*3/uL — ABNORMAL LOW (ref 0.9–3.3)
MCH: 29.9 pg (ref 26.0–34.0)
MCHC: 33.3 g/dL (ref 32.0–36.0)
MCV: 89.7 fL (ref 81.0–101.0)
MONO ABS: 0.7 10*3/uL (ref 0.1–0.9)
MONOS PCT: 11 %
NEUTROS PCT: 74 %
Neutro Abs: 4.8 10*3/uL (ref 1.5–6.5)
PLATELETS: 190 10*3/uL (ref 145–400)
RBC: 4.35 MIL/uL (ref 3.70–5.32)
RDW: 13.9 % (ref 11.1–15.7)
WBC Count: 6.6 10*3/uL (ref 3.9–10.0)

## 2017-11-24 LAB — CMP (CANCER CENTER ONLY)
ALT: 17 U/L (ref 10–47)
AST: 30 U/L (ref 11–38)
Albumin: 3.5 g/dL (ref 3.5–5.0)
Alkaline Phosphatase: 64 U/L (ref 26–84)
Anion gap: 10 (ref 5–15)
BILIRUBIN TOTAL: 0.6 mg/dL (ref 0.2–1.6)
BUN: 14 mg/dL (ref 7–22)
CO2: 29 mmol/L (ref 18–33)
Calcium: 9.4 mg/dL (ref 8.0–10.3)
Chloride: 104 mmol/L (ref 98–108)
Creatinine: 0.8 mg/dL (ref 0.60–1.20)
Glucose, Bld: 111 mg/dL (ref 73–118)
Potassium: 3.8 mmol/L (ref 3.3–4.7)
Sodium: 143 mmol/L (ref 128–145)
Total Protein: 6.3 g/dL — ABNORMAL LOW (ref 6.4–8.1)

## 2017-11-24 MED ORDER — DIPHENHYDRAMINE HCL 25 MG PO TABS
50.0000 mg | ORAL_TABLET | Freq: Once | ORAL | Status: DC
  Filled 2017-11-24: qty 2

## 2017-11-24 MED ORDER — ACETAMINOPHEN 325 MG PO TABS
650.0000 mg | ORAL_TABLET | Freq: Once | ORAL | Status: AC
  Administered 2017-11-24: 650 mg via ORAL

## 2017-11-24 MED ORDER — IMMUNE GLOBULIN (HUMAN) 20 GM/200ML IV SOLN
40.0000 g | Freq: Once | INTRAVENOUS | Status: AC
  Administered 2017-11-24: 40 g via INTRAVENOUS
  Filled 2017-11-24: qty 400

## 2017-11-24 MED ORDER — SODIUM CHLORIDE 0.9 % IJ SOLN
10.0000 mL | Freq: Once | INTRAMUSCULAR | Status: AC
  Administered 2017-11-24: 10 mL
  Filled 2017-11-24: qty 10

## 2017-11-24 MED ORDER — HEPARIN SOD (PORK) LOCK FLUSH 100 UNIT/ML IV SOLN
500.0000 [IU] | Freq: Once | INTRAVENOUS | Status: AC
  Administered 2017-11-24: 500 [IU] via INTRAVENOUS
  Filled 2017-11-24: qty 5

## 2017-11-24 MED ORDER — DIPHENHYDRAMINE HCL 25 MG PO TABS
25.0000 mg | ORAL_TABLET | Freq: Once | ORAL | Status: AC
  Administered 2017-11-24: 25 mg via ORAL
  Filled 2017-11-24: qty 1

## 2017-11-24 MED ORDER — METHYLPREDNISOLONE SODIUM SUCC 125 MG IJ SOLR
INTRAMUSCULAR | Status: AC
  Filled 2017-11-24: qty 2

## 2017-11-24 MED ORDER — DIPHENHYDRAMINE HCL 25 MG PO CAPS
ORAL_CAPSULE | ORAL | Status: AC
  Filled 2017-11-24: qty 1

## 2017-11-24 MED ORDER — FAMOTIDINE IN NACL 20-0.9 MG/50ML-% IV SOLN
INTRAVENOUS | Status: AC
  Filled 2017-11-24: qty 100

## 2017-11-24 MED ORDER — SODIUM CHLORIDE 0.9 % IV SOLN
40.0000 mg | Freq: Once | INTRAVENOUS | Status: AC
  Administered 2017-11-24: 40 mg via INTRAVENOUS
  Filled 2017-11-24: qty 4

## 2017-11-24 MED ORDER — ACETAMINOPHEN 325 MG PO TABS
ORAL_TABLET | ORAL | Status: AC
  Filled 2017-11-24: qty 2

## 2017-11-24 MED ORDER — METHYLPREDNISOLONE SODIUM SUCC 125 MG IJ SOLR
125.0000 mg | Freq: Once | INTRAMUSCULAR | Status: AC
  Administered 2017-11-24: 125 mg via INTRAVENOUS

## 2017-11-24 MED FILL — Famotidine Inj 200 MG/20ML: INTRAVENOUS | Qty: 40 | Status: AC

## 2017-11-24 NOTE — Progress Notes (Signed)
Patient refuses to stay for the 30 minute post IVIG observation.

## 2017-11-24 NOTE — Patient Instructions (Signed)
Immune Globulin Injection (IVIG) What is this medicine? IMMUNE GLOBULIN (im MUNE GLOB yoo lin) helps to prevent or reduce the severity of certain infections in patients who are at risk. This medicine is collected from the pooled blood of many donors. It is used to treat immune system problems, thrombocytopenia, and Kawasaki syndrome. This medicine may be used for other purposes; ask your health care provider or pharmacist if you have questions. COMMON BRAND NAME(S): Baygam, BIVIGAM, Carimune, Carimune NF, Cuvitru, Flebogamma, Flebogamma DIF, GamaSTAN S/D, Gamimune N, Gammagard, Gammagard S/D, Gammaked, Gammaplex, Gammar-P IV, Gamunex, Gamunex-C, Hizentra, Iveegam, Iveegam EN, Octagam, Panglobulin, Panglobulin NF, Polygam S/D, Privigen, Sandoglobulin, Venoglobulin-S, Vigam, Vivaglobulin What should I tell my health care provider before I take this medicine? They need to know if you have any of these conditions: - diabetes - extremely low or no immune antibodies in the blood - heart disease - history of blood clots - hyperprolinemia - infection in the blood, sepsis - kidney disease - taking medicine that may change kidney function - ask your health care provider about your medicine - an unusual or allergic reaction to human immune globulin, albumin, maltose, sucrose, polysorbate 80, other medicines, foods, dyes, or preservatives - pregnant or trying to get pregnant - breast-feeding How should I use this medicine? This medicine is for injection into a muscle or infusion into a vein or skin. It is usually given by a health care professional in a hospital or clinic setting. In rare cases, some brands of this medicine might be given at home. You will be taught how to give this medicine. Use exactly as directed. Take your medicine at regular intervals. Do not take your medicine more often than directed. Talk to your pediatrician regarding the use of this medicine in children. Special care  may be needed. Overdosage: If you think you have taken too much of this medicine contact a poison control center or emergency room at once. NOTE: This medicine is only for you. Do not share this medicine with others. What if I miss a dose? It is important not to miss your dose. Call your doctor or health care professional if you are unable to keep an appointment. If you give yourself the medicine and you miss a dose, take it as soon as you can. If it is almost time for your next dose, take only that dose. Do not take double or extra doses. What may interact with this medicine? -aspirin and aspirin-like medicines -cisplatin -cyclosporine -medicines for infection like acyclovir, adefovir, amphotericin B, bacitracin, cidofovir, foscarnet, ganciclovir, gentamicin, pentamidine, vancomycin -NSAIDS, medicines for pain and inflammation, like ibuprofen or naproxen -pamidronate -vaccines -zoledronic acid This list may not describe all possible interactions. Give your health care provider a list of all the medicines, herbs, non-prescription drugs, or dietary supplements you use. Also tell them if you smoke, drink alcohol, or use illegal drugs. Some items may interact with your medicine. What should I watch for while using this medicine? Your condition will be monitored carefully while you are receiving this medicine. This medicine is made from pooled blood donations of many different people. It may be possible to pass an infection in this medicine. However, the donors are screened for infections and all products are tested for HIV and hepatitis. The medicine is treated to kill most or all bacteria and viruses. Talk to your doctor about the risks and benefits of this medicine. Do not have vaccinations for at least 14 days before, or until at least 3 months   after receiving this medicine. What side effects may I notice from receiving this medicine? Side effects that you should report to your doctor or health  care professional as soon as possible: -allergic reactions like skin rash, itching or hives, swelling of the face, lips, or tongue -breathing problems -chest pain or tightness -fever, chills -headache with nausea, vomiting -neck pain or difficulty moving neck -pain when moving eyes -pain, swelling, warmth in the leg -problems with balance, talking, walking -sudden weight gain -swelling of the ankles, feet, hands -trouble passing urine or change in the amount of urine Side effects that usually do not require medical attention (report to your doctor or health care professional if they continue or are bothersome): -dizzy, drowsy -flushing -increased sweating -leg cramps -muscle aches and pains -pain at site where injected This list may not describe all possible side effects. Call your doctor for medical advice about side effects. You may report side effects to FDA at 1-800-FDA-1088. Where should I keep my medicine? Keep out of the reach of children. This drug is usually given in a hospital or clinic and will not be stored at home. In rare cases, some brands of this medicine may be given at home. If you are using this medicine at home, you will be instructed on how to store this medicine. Throw away any unused medicine after the expiration date on the label. NOTE: This sheet is a summary. It may not cover all possible information. If you have questions about this medicine, talk to your doctor, pharmacist, or health care provider.  2018 Elsevier/Gold Standard (2008-11-20 11:44:49)  

## 2017-11-24 NOTE — Progress Notes (Signed)
Patient reporting intermittent left hip pain "where my bone marrow biopsy was done". She reports it is occurring more frequently although it does not hurt now .

## 2017-11-24 NOTE — Progress Notes (Signed)
Hematology and Oncology Follow Up Visit  Shannon Parsons [id number] Oct 22, 1940 77 y.o. 11/24/2017   Principle Diagnosis:   Mantle cell lymphoma (MCIPI = 5)  Thromboembolic event of the right leg  Acquired Hypogammaglobulinemia  Bronchiectasis  Current Therapy:    Rituxan - maintanence - s/p cycle #10 - to finish in 07/2017  IVIG - q  month -restart on 10/27/2017     Interim History:  [name] is back for follow-up.  She is feeling a little bit better.  She is does not have as much cough.  Her sinuses are also improved.  She is having no problems with fever.  She is having no bleeding.  She is having no nausea or vomiting.  She said that she has an episode of vomiting once a month.  I am not sure what could be causing this.  She does see a gastroenterologist.  She is had no headache.  She has had no visual changes.   Currently, her performance status is ECOG 1.    Medications:  Current Outpatient Medications:  .  acetaminophen (TYLENOL) 325 MG tablet, Take 325 mg by mouth every 6 (six) hours as needed for moderate pain (For pain.). , Disp: , Rfl:  .  budesonide-formoterol (SYMBICORT) 80-4.5 MCG/ACT inhaler, Take 2 puffs first thing in am and then another 2 puffs about 12 hours later. (Patient not taking: Reported on 11/24/2017), Disp: 1 Inhaler, Rfl: 11 .  Cholecalciferol (VITAMIN D3) 5000 units CAPS, Take 10,000 Units by mouth daily., Disp: , Rfl:  .  diazepam (VALIUM) 5 MG tablet, Take 1/2 to 1  tablet 3 x / day for Tremor, Anxiety or Sleep (Patient taking differently: Take 2.5 mg by mouth at bedtime as needed for anxiety or sedation. Take 1/2 to 1  tablet 3 x / day for Tremor, Anxiety or Sleep), Disp: 270 tablet, Rfl: 1 .  hyoscyamine (LEVSIN, ANASPAZ) 0.125 MG tablet, Take 1 to 2 tablets every 4 hours as needed for nausea, cramping, bloating or diarrhea (Patient taking differently: Take 0.125 mg by mouth every 4 (four) hours as needed for cramping. ), Disp: 90 tablet,  Rfl: 11 .  ipratropium-albuterol (DUONEB) 0.5-2.5 (3) MG/3ML SOLN, Take 3 mLs by nebulization every 4 (four) hours as needed (heaviness in chest). , Disp: , Rfl:  .  levothyroxine (SYNTHROID, LEVOTHROID) 50 MCG tablet, TAKE 1 TABLET BY MOUTH ONCE DAILY., Disp: 90 tablet, Rfl: 4 .  lidocaine-prilocaine (EMLA) cream, Apply 1 application topically as needed. Apply to [facility] one hour before access., Disp: 30 g, Rfl: 3 .  PROAIR HFA 108 (90 Base) MCG/ACT inhaler, INHALE 2 PUFFS EVERY 4 HOURS AS NEEDED INTO THE LUNGS FOR WHEEZING OR SHORTNESS OF BREATH. (Patient not taking: Reported on 11/24/2017), Disp: 48 g, Rfl: 1 .  Probiotic Product (PROBIOTIC DAILY PO), Take 1 capsule by mouth daily., Disp: , Rfl:  .  Respiratory Therapy Supplies (FLUTTER) DEVI, Take as directed, Disp: 1 each, Rfl: 0 No current facility-administered medications for this visit.   Facility-Administered Medications Ordered in Other Visits:  .  acetaminophen (TYLENOL) tablet 650 mg, 650 mg, Oral, Once, Shannon Parsons, Rudell Cobb, MD .  diphenhydrAMINE (BENADRYL) tablet 50 mg, 50 mg, Oral, Once, Shannon Parsons, Rudell Cobb, MD .  famotidine (PEPCID) 40 mg in sodium chloride 0.9 % 50 mL IVPB, 40 mg, Intravenous, Once, Shannon Napoleon, MD, 40 mg at 11/24/17 0932 .  Immune Globulin 10% (PRIVIGEN) IV infusion 40 g, 40 g, Intravenous, Once, Shannon Parsons, Rudell Cobb, MD .  ipratropium-albuterol (DUONEB) 0.5-2.5 (3) MG/3ML nebulizer solution 3 mL, 3 mL, Nebulization, Q6H, Shannon, Holli Humbles, NP  Allergies:  Allergies  Allergen Reactions  . Alendronate Other (See Comments)    "made me choke" "made me choke" chokes  . Risedronate Sodium Other (See Comments)    "made me choke"  "made me choke"   . Sulfa Antibiotics Other (See Comments)    Doesn't remember   . Sulfasalazine Other (See Comments)    Doesn't remember   . Tetracyclines & Related Rash    Past Medical History, Surgical history, Social history, and Family History were reviewed and updated.  Review of  Systems: Review of Systems  Constitutional: Positive for malaise/fatigue.  HENT: Positive for congestion.   Eyes: Negative.   Respiratory: Positive for shortness of breath and wheezing.   Cardiovascular: Negative.   Gastrointestinal: Negative.   Genitourinary: Negative.   Musculoskeletal: Negative.   Skin: Negative.   Neurological: Negative.   Endo/Heme/Allergies: Negative.   Psychiatric/Behavioral: Negative.     Physical Exam:  weight is 122 lb 8 oz (55.6 kg). Her oral temperature is 97.7 F (36.5 C). Her blood pressure is 126/53 (abnormal) and her pulse is 63. Her respiration is 20 and oxygen saturation is 100%.   Physical Exam  Constitutional: She is oriented to person, place, and time.  HENT:  Head: Normocephalic and atraumatic.  Mouth/Throat: Oropharynx is clear and moist.  Eyes: EOM are normal. Pupils are equal, round, and reactive to light.  Neck: Normal range of motion.  Cardiovascular: Normal rate, regular rhythm and normal heart sounds.  Pulmonary/Chest: Effort normal and breath sounds normal.  Abdominal: Soft. Bowel sounds are normal.  Musculoskeletal: Normal range of motion. She exhibits no edema, tenderness or deformity.  Lymphadenopathy:    She has no cervical adenopathy.  Neurological: She is alert and oriented to person, place, and time.  Skin: Skin is warm and dry. No rash noted. No erythema.  Psychiatric: She has a normal mood and affect. Her behavior is normal. Judgment and thought content normal.  Vitals reviewed.    Lab Results  Component Value Date   WBC 6.6 11/24/2017   HGB 13.8 09/15/2017   HCT 39.0 11/24/2017   MCV 89.7 11/24/2017   PLT 190 11/24/2017     Chemistry      Component Value Date/Time   NA 143 11/24/2017 0810   NA 141 07/13/2017 0743   NA 138 08/01/2015 1124   K 3.8 11/24/2017 0810   K 4.2 07/13/2017 0743   K 4.0 08/01/2015 1124   CL 104 11/24/2017 0810   CL 105 07/13/2017 0743   CO2 29 11/24/2017 0810   CO2 27 07/13/2017  0743   CO2 28 08/01/2015 1124   BUN 14 11/24/2017 0810   BUN 16 07/13/2017 0743   BUN 10.4 08/01/2015 1124   CREATININE 0.80 11/24/2017 0810   CREATININE 0.98 (H) 09/15/2017 1518   CREATININE 0.8 08/01/2015 1124      Component Value Date/Time   CALCIUM 9.4 11/24/2017 0810   CALCIUM 9.5 07/13/2017 0743   CALCIUM 9.6 08/01/2015 1124   ALKPHOS 64 11/24/2017 0810   ALKPHOS 68 07/13/2017 0743   ALKPHOS 88 08/01/2015 1124   AST 30 11/24/2017 0810   AST 23 08/01/2015 1124   ALT 17 11/24/2017 0810   ALT 19 07/13/2017 0743   ALT 12 08/01/2015 1124   BILITOT 0.6 11/24/2017 0810   BILITOT <0.30 08/01/2015 1124      Impression and Plan: Ms. Derk  is 77 year old white female. She has mantle cell lymphoma. Again, by the [facility] she is an intermediate risk.  She received 2 different courses of chemotherapy. She could not tolerate R-CHOP with Velcade substituting for vincristine. This however was incredibly helpful. We finished up with Rituxan/bendamustine. She completed this in August 2016.  We then started maintenance Rituxan. She started this in November 2016.  She completed this in November 2018.  For right now, everything looks okay with respect to the mantle cell lymphoma.  Her main problem now is this bronchiectasis and sinus issue.  We are given her IVIG.  The IVIG has helped.  Hopefully, we can start moving her appointments out a little bit.  I will see back in her back now in 6 weeks.   Shannon Napoleon, MD 3/14/20199:36 AM

## 2017-11-25 LAB — IGG, IGA, IGM
IGA: 27 mg/dL — AB (ref 64–422)
IGG (IMMUNOGLOBIN G), SERUM: 877 mg/dL (ref 700–1600)

## 2017-11-26 LAB — ACID FAST CULTURE WITH REFLEXED SENSITIVITIES (MYCOBACTERIA): Acid Fast Culture: NEGATIVE

## 2017-12-19 ENCOUNTER — Encounter: Payer: Self-pay | Admitting: Physician Assistant

## 2017-12-19 ENCOUNTER — Ambulatory Visit (INDEPENDENT_AMBULATORY_CARE_PROVIDER_SITE_OTHER): Payer: PPO | Admitting: Physician Assistant

## 2017-12-19 VITALS — BP 114/68 | HR 67 | Temp 97.7°F | Resp 16 | Ht 63.5 in | Wt 123.6 lb

## 2017-12-19 DIAGNOSIS — Z6820 Body mass index (BMI) 20.0-20.9, adult: Secondary | ICD-10-CM | POA: Diagnosis not present

## 2017-12-19 DIAGNOSIS — D649 Anemia, unspecified: Secondary | ICD-10-CM | POA: Diagnosis not present

## 2017-12-19 DIAGNOSIS — M810 Age-related osteoporosis without current pathological fracture: Secondary | ICD-10-CM

## 2017-12-19 DIAGNOSIS — J479 Bronchiectasis, uncomplicated: Secondary | ICD-10-CM

## 2017-12-19 DIAGNOSIS — R7303 Prediabetes: Secondary | ICD-10-CM

## 2017-12-19 DIAGNOSIS — I700 Atherosclerosis of aorta: Secondary | ICD-10-CM

## 2017-12-19 DIAGNOSIS — C8311 Mantle cell lymphoma, lymph nodes of head, face, and neck: Secondary | ICD-10-CM | POA: Diagnosis not present

## 2017-12-19 DIAGNOSIS — K2100 Gastro-esophageal reflux disease with esophagitis, without bleeding: Secondary | ICD-10-CM

## 2017-12-19 DIAGNOSIS — I10 Essential (primary) hypertension: Secondary | ICD-10-CM | POA: Diagnosis not present

## 2017-12-19 DIAGNOSIS — E559 Vitamin D deficiency, unspecified: Secondary | ICD-10-CM

## 2017-12-19 DIAGNOSIS — Z0001 Encounter for general adult medical examination with abnormal findings: Secondary | ICD-10-CM | POA: Diagnosis not present

## 2017-12-19 DIAGNOSIS — Z79899 Other long term (current) drug therapy: Secondary | ICD-10-CM

## 2017-12-19 DIAGNOSIS — J449 Chronic obstructive pulmonary disease, unspecified: Secondary | ICD-10-CM

## 2017-12-19 DIAGNOSIS — Z86718 Personal history of other venous thrombosis and embolism: Secondary | ICD-10-CM

## 2017-12-19 DIAGNOSIS — J320 Chronic maxillary sinusitis: Secondary | ICD-10-CM

## 2017-12-19 DIAGNOSIS — R350 Frequency of micturition: Secondary | ICD-10-CM | POA: Diagnosis not present

## 2017-12-19 DIAGNOSIS — Z0000 Encounter for general adult medical examination without abnormal findings: Secondary | ICD-10-CM

## 2017-12-19 DIAGNOSIS — D801 Nonfamilial hypogammaglobulinemia: Secondary | ICD-10-CM | POA: Diagnosis not present

## 2017-12-19 DIAGNOSIS — E782 Mixed hyperlipidemia: Secondary | ICD-10-CM | POA: Diagnosis not present

## 2017-12-19 DIAGNOSIS — Z6821 Body mass index (BMI) 21.0-21.9, adult: Secondary | ICD-10-CM

## 2017-12-19 DIAGNOSIS — R42 Dizziness and giddiness: Secondary | ICD-10-CM

## 2017-12-19 DIAGNOSIS — K5790 Diverticulosis of intestine, part unspecified, without perforation or abscess without bleeding: Secondary | ICD-10-CM

## 2017-12-19 DIAGNOSIS — G4733 Obstructive sleep apnea (adult) (pediatric): Secondary | ICD-10-CM

## 2017-12-19 DIAGNOSIS — G250 Essential tremor: Secondary | ICD-10-CM

## 2017-12-19 DIAGNOSIS — F411 Generalized anxiety disorder: Secondary | ICD-10-CM

## 2017-12-19 DIAGNOSIS — R29818 Other symptoms and signs involving the nervous system: Secondary | ICD-10-CM

## 2017-12-19 DIAGNOSIS — R002 Palpitations: Secondary | ICD-10-CM

## 2017-12-19 DIAGNOSIS — K589 Irritable bowel syndrome without diarrhea: Secondary | ICD-10-CM

## 2017-12-19 DIAGNOSIS — R6889 Other general symptoms and signs: Secondary | ICD-10-CM | POA: Diagnosis not present

## 2017-12-19 DIAGNOSIS — J4489 Other specified chronic obstructive pulmonary disease: Secondary | ICD-10-CM

## 2017-12-19 DIAGNOSIS — M1990 Unspecified osteoarthritis, unspecified site: Secondary | ICD-10-CM

## 2017-12-19 NOTE — Progress Notes (Signed)
MEDICARE ANNUAL WELLNESS VISIT AND CPE  Assessment:    Essential hypertension - continue medications, DASH diet, exercise and monitor at home. Call if greater than 130/80.  -     Hepatic function panel -     BASIC METABOLIC PANEL WITH GFR -     TSH  Chronic obstructive airway disease with asthma ([place]) Continue follow up pulmonary  OSA (obstructive sleep apnea) May need auto titration CPAP  Obstructive bronchiectasis ([place]) Continue follow up pulmonary  History of DVT Likely due to chemo/cancer, will monitor for signs, stay on bASA  Mantle cell lymphoma of intra-abdominal lymph nodes ([place]) Continue follow up oncology  GERD Continue PPI/H2 blocker, diet discussed  Irritable bowel syndrome, unspecified type If not on benefiber then add it, decrease stress,  if any worsening symptoms, blood in stool, AB pain, etc call office  Diverticulosis of intestine without bleeding, unspecified intestinal tract location Increase fiber, if any worsening symptoms, blood in stool, AB pain, etc call office  Osteoporosis, unspecified osteoporosis type, unspecified pathological fracture presence Continue supplement  Osteoarthritis, unspecified osteoarthritis type, unspecified site monitor  Anxiety state continue medications, stress management techniques discussed, increase water, good sleep hygiene discussed, increase exercise, and increase veggies.   History of colonic polyps UTD  Mixed hyperlipidemia -     Lipid panel  Vitamin D deficiency -     VITAMIN D 25 Hydroxy (Vit-D Deficiency, Fractures)  Medication management -     Magnesium  Normocytic anemia -     Ferritin -     Iron and TIBC  Acquired hypogammaglobulinemia (HCC) Continue follow up oncology  Encounter for Medicare annual wellness exam 1 year  Urinary frequency -     Urinalysis, Routine w reflex microscopic -     Urine culture  BMI 21.0-21.9, adult Monitor  Atherosclerosis of aorta (HCC) Control blood  pressure, cholesterol, glucose, increase exercise.   Sinusitis, maxillary, chronic Follow up ENT  Essential tremor No worsening symptoms  Other symptoms and signs involving the nervous system with vertigo/dizziness -     MR Brain W Wo Contrast; Future  Urinary frequency -     Urine Culture  Palpitations with dizziness Normal EKG, has had normal stress test/echo Check labs get holter -     Holter monitor - 48 hour; Future  Dizziness Get MRI with history of cancer, rule out METS, get holter More likely from chronic sinusitis- will follow up with ENT  Over 40 minutes of exam, counseling, chart review and critical decision making was performed Future Appointments  Date Time Provider [place]  01/05/2018  7:45 AM [facility] LAB [facility] None  01/05/2018  8:00 AM [facility] B4 [facility] None  01/05/2018  8:15 AM [place], Rudell Cobb, MD [facility] None  01/05/2018  9:00 AM [facility] B3 [facility] None  03/29/2018 11:00 AM Unk Pinto, MD [facility] None  09/26/2018  2:00 PM Unk Pinto, MD [facility] None    Plan:   During the course of the visit the patient was educated and counseled about appropriate screening and preventive services including:    Pneumococcal vaccine   Prevnar 13  Influenza vaccine  Td vaccine  Screening electrocardiogram  Bone densitometry screening  Colorectal cancer screening  Diabetes screening  Glaucoma screening  Nutrition counseling   Advanced directives: requested   Subjective:  Shannon Parsons is a 77 y.o. female who presents for Medicare Annual Wellness Visit and complete physical.     Her blood pressure has been controlled at home, today their BP  is BP: 114/68 She does not workout. She denies chest pain, shortness of breath, dizziness.   She has a history of Mantle cell lymphoma, thromboembolic right leg due to cancer/chemo that is resolved, and bronchiectasis and acquired hypogammaglobulinemia, she continues to follow with Dr.  Marin Olp, and is doing very well.  She is on CPAP, has chronic cough/sinusitis following with Dr. Melvyn Novas and Constance Holster and has obstructive bronchiectasis, she states it is getting better.   HOWEVER she has had progressive dizziness. She states she was sitting in her recliner doing nothing, she states she could not focus and then had vertigo symptoms, lasted x 30-60 seconds. She has also had imbalance while walking recently. MRI brain 2016 normal. She has had some popping/clicking in her ears, feeling of fullness, Had CT sinus 10/2017. With normal/clear mastoid and tympanic cavities. She has had head tremor for years. She has had some palpitations or feeling her heart beat skip a beat.   She is not on cholesterol medication and denies myalgias. Her cholesterol is not at goal. The cholesterol last visit was:   Lab Results  Component Value Date   CHOL 250 (H) 09/15/2017   HDL 62 09/15/2017   LDLCALC 157 (H) 09/15/2017   TRIG 173 (H) 09/15/2017   CHOLHDL 4.0 09/15/2017   Last A1C Lab Results  Component Value Date   HGBA1C 5.6 09/15/2017   Last GFR: Lab Results  Component Value Date   GFRNONAA 57 (L) 09/15/2017   Patient is on Vitamin D supplement.   Lab Results  Component Value Date   VD25OH 49 09/15/2017     She is on thyroid medication. Her medication was not changed last visit.   Lab Results  Component Value Date   TSH 1.75 09/15/2017   BMI is Body mass index is 21.55 kg/m., she is working on diet and exercise. Wt Readings from Last 3 Encounters:  12/19/17 123 lb 9.6 oz (56.1 kg)  11/24/17 122 lb 8 oz (55.6 kg)  10/27/17 122 lb (55.3 kg)     Medication Review: Current Outpatient Medications on File Prior to Visit  Medication Sig Dispense Refill  . acetaminophen (TYLENOL) 325 MG tablet Take 325 mg by mouth every 6 (six) hours as needed for moderate pain (For pain.).     [name] budesonide-formoterol (SYMBICORT) 80-4.5 MCG/ACT inhaler Take 2 puffs first thing in am and then another 2  puffs about 12 hours later. 1 Inhaler 11  . Cholecalciferol (VITAMIN D3) 5000 units CAPS Take 10,000 Units by mouth daily.    . diazepam (VALIUM) 5 MG tablet Take 1/2 to 1  tablet 3 x / day for Tremor, Anxiety or Sleep (Patient taking differently: Take 2.5 mg by mouth at bedtime as needed for anxiety or sedation. Take 1/2 to 1  tablet 3 x / day for Tremor, Anxiety or Sleep) 270 tablet 1  . hyoscyamine (LEVSIN, ANASPAZ) 0.125 MG tablet Take 1 to 2 tablets every 4 hours as needed for nausea, cramping, bloating or diarrhea (Patient taking differently: Take 0.125 mg by mouth every 4 (four) hours as needed for cramping. ) 90 tablet 11  . ipratropium-albuterol (DUONEB) 0.5-2.5 (3) MG/3ML SOLN Take 3 mLs by nebulization every 4 (four) hours as needed (heaviness in chest).     [name] levothyroxine (SYNTHROID, LEVOTHROID) 50 MCG tablet TAKE 1 TABLET BY MOUTH ONCE DAILY. 90 tablet 4  . lidocaine-prilocaine (EMLA) cream Apply 1 application topically as needed. Apply to [facility] one hour before access. 30 g 3  .  PROAIR HFA 108 (90 Base) MCG/ACT inhaler INHALE 2 PUFFS EVERY 4 HOURS AS NEEDED INTO THE LUNGS FOR WHEEZING OR SHORTNESS OF BREATH. 48 g 1  . Probiotic Product (PROBIOTIC DAILY PO) Take 1 capsule by mouth daily.    [name] Respiratory Therapy Supplies (FLUTTER) DEVI Take as directed 1 each 0   Current Facility-Administered Medications on File Prior to Visit  Medication Dose Route Frequency Provider Last Rate Last Dose  . ipratropium-albuterol (DUONEB) 0.5-2.5 (3) MG/3ML nebulizer solution 3 mL  3 mL Nebulization Q6H [place], Holli Humbles, NP        Allergies  Allergen Reactions  . Alendronate Other (See Comments)    "made me choke" "made me choke" chokes  . Risedronate Sodium Other (See Comments)    "made me choke"  "made me choke"   . Sulfa Antibiotics Other (See Comments)    Doesn't remember   . Sulfasalazine Other (See Comments)    Doesn't remember   . Tetracyclines & Related Rash    Current Problems  (verified) Patient Active Problem List   Diagnosis Date Noted  . Sinusitis, maxillary, chronic 10/19/2017  . Atherosclerosis of aorta ([place]) 08/09/2017  . Acquired hypogammaglobulinemia ([place]) 04/05/2016  . Obstructive bronchiectasis ([place]) 03/18/2016  . Body mass index (BMI) of 20.0-20.9 in adult 07/21/2015  . Encounter for Medicare annual wellness exam 04/02/2015  . History of DVT (deep vein thrombosis)   . Normocytic anemia 01/04/2015  . Mantle cell lymphoma ([place]) 10/18/2014  . Essential tremor 07/15/2014  . Medication management 07/15/2014  . OSA (obstructive sleep apnea) 07/05/2014  . Anxiety state 02/27/2014  . Hyperlipidemia   . Hypertension   . GERD   . Osteoporosis  (Refuses treatment)   . Osteoarthritis   . Vitamin D deficiency   . Prediabetes   . IBS   . Diverticulosis   . History of colonic polyps 07/19/2011  . Chronic obstructive airway disease with asthma ([place]) 03/16/2011    Screening Tests Immunization History  Administered Date(s) Administered  . Pneumococcal Conjugate-13 03/18/2016  . Pneumococcal Polysaccharide-23 06/18/2013  . Zoster 04/17/2013   Preventative care: Last colonoscopy: 2018 EGD 2015 Last mammogram: 06/2017 Last pap smear/pelvic exam: 2010   DEXA:2017 Ct sinus 10/2017 MRI brain w/wo 2016- may need repeat Echo 02/2015 Stress test 2015 Sleep study 11/30/2016  Prior vaccinations: TD or Tdap: declined  Influenza: declined Pneumococcal: 2014 Prevnar13: 2017 Shingles/Zostavax: 2014  Names of Other Physician/Practitioners you currently use: 1. [facility] here for primary care 2. [facility], eye doctor, last visit Aug 2018 3. 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 FAMILY HISTORY Her family history includes Emphysema in her father; Heart attack (age of onset: 67) in her father; Heart disease in her brother and father; Hypertension in her mother; Rheum arthritis in her sister; Thyroid disease in her mother. SOCIAL HISTORY She  reports that she has never smoked. She has never used smokeless tobacco. She reports that she does not drink alcohol or use drugs.   MEDICARE WELLNESS OBJECTIVES: Physical activity:   Cardiac risk factors:   Depression/mood screen:  Depression screen PHQ 2/9 09/15/2017  Decreased Interest 0  Down, Depressed, Hopeless 0  PHQ - 2 Score 0  Some recent data might be hidden    ADLs:  In your present state of health, do you have any difficulty performing the following activities: 09/15/2017 03/01/2017  Hearing? N N  Vision? N N  Difficulty concentrating or making decisions? N N  Walking or climbing stairs? N N  Dressing or bathing? N N  Doing errands, shopping? N N  Some recent data might be hidden     Cognitive Testing  Alert? Yes  Normal Appearance?Yes  Oriented to person? Yes  Place? Yes   Time? Yes  Recall of three objects?  Yes  Can perform simple calculations? Yes  Displays appropriate judgment?Yes  Can read the correct time from a watch face?Yes  EOL planning: Does Patient Have a Medical Advance Directive?: Yes Type of Advance Directive: Healthcare Power of Attorney, Living will [place] in Chart?: No - copy requested  Review of Systems  Constitutional: Positive for malaise/fatigue. Negative for chills and fever.  HENT: Negative for congestion, sore throat and tinnitus.   Eyes: Negative.   Respiratory: Negative  for cough, shortness of breath and wheezing.   Cardiovascular: Negative for chest pain, palpitations and leg swelling.  Gastrointestinal: Negative for blood in stool and melena.  Genitourinary: Negative for hematuria.  Musculoskeletal: Positive for myalgias.  Skin: Negative.   Neurological: Negative for dizziness, sensory change, loss of consciousness and headaches.  Psychiatric/Behavioral: Negative for depression. The patient is not nervous/anxious and does not have insomnia.      Objective:     Today's Vitals   12/19/17 0851  BP: 114/68  Pulse: 67  Resp: 16  Temp: 97.7 F (36.5 C)  SpO2: 94%  Weight: 123 lb 9.6 oz (56.1 kg)  Height: 5' 3.5" (1.613 m)   Body mass index is 21.55 kg/m.  General appearance: alert, no distress, WD/WN, female HEENT: normocephalic, sclerae anicteric, TMs pearly, nares patent, no discharge or erythema, pharynx normal Oral cavity: MMM, no lesions Neck: supple, no lymphadenopathy, no thyromegaly, no masses Heart: RRR, normal S1, S2, no murmurs Lungs: CTA bilaterally, no wheezes, rhonchi, or rales Abdomen: +bs, soft, non tender, non distended, no masses, no hepatomegaly, no splenomegaly Musculoskeletal: nontender, no swelling, no obvious deformity Extremities: no edema, no cyanosis, no clubbing Pulses: 2+ symmetric, upper and lower extremities, normal cap refill Neurological: alert, oriented x 3, CN2-12 intact, strength normal upper extremities and lower extremities, sensation normal throughout, DTRs 2+ throughout, no cerebellar signs, gait normal Psychiatric: normal affect, behavior normal, pleasant   Medicare Attestation I have personally reviewed: The patient's medical and social history Their use of alcohol, tobacco or illicit drugs Their current medications and supplements The patient's functional ability including ADLs,fall risks, home safety risks, cognitive, and hearing and visual impairment Diet and physical activities Evidence for  depression or mood disorders  The patient's weight, height, BMI, and visual acuity have been recorded in the chart.  I have made referrals, counseling, and provided education to the patient based on review of the above and I have provided the patient with a written personalized care plan for preventive services.     Vicie Mutters, PA-C   12/19/2017

## 2017-12-19 NOTE — Patient Instructions (Signed)
Can call Dr. Toy Cookey for dentist. # [phone]   Palpitations A palpitation is the feeling that your heartbeat is irregular or is faster than normal. It may feel like your heart is fluttering or skipping a beat. Palpitations are usually not a serious problem. They may be caused by many things, including smoking, caffeine, alcohol, stress, and certain medicines. Although most causes of palpitations are not serious, palpitations can be a sign of a serious medical problem. In some cases, you may need further medical evaluation. Follow these instructions at home: Pay attention to any changes in your symptoms. Take these actions to help with your condition:  Avoid the following: ? Caffeinated coffee, tea, soft drinks, diet pills, and energy drinks. ? Chocolate. ? Alcohol.  Do not use any tobacco products, such as cigarettes, chewing tobacco, and e-cigarettes. If you need help quitting, ask your health care provider.  Try to reduce your stress and anxiety. Things that can help you relax include: ? Yoga. ? Meditation. ? Physical activity, such as swimming, jogging, or walking. ? Biofeedback. This is a method that helps you learn to use your mind to control things in your body, such as your heartbeats.  Get plenty of rest and sleep.  Take over-the-counter and prescription medicines only as told by your health care provider.  Keep all follow-up visits as told by your health care provider. This is important.  Contact a health care provider if:  You continue to have a fast or irregular heartbeat after 24 hours.  Your palpitations occur more often. Get help right away if:  You have chest pain or shortness of breath.  You have a severe headache.  You feel dizzy or you faint. This information is not intended to replace advice given to you by your health care provider. Make sure you discuss any questions you have with your health care provider. Document Released: 08/27/2000 Document  Revised: 02/02/2016 Document Reviewed: 05/15/2015 Elsevier Interactive Patient Education  2018 [employer].   Vertigo Vertigo means that you feel like you are moving when you are not. Vertigo can also make you feel like things around you are moving when they are not. This feeling can come and go at any time. Vertigo often goes away on its own. Follow these instructions at home:  Avoid making fast movements.  Avoid driving.  Avoid using heavy machinery.  Avoid doing any task or activity that might cause danger to you or other people if you would have a vertigo attack while you are doing it.  Sit down right away if you feel dizzy or have trouble with your balance.  Take over-the-counter and prescription medicines only as told by your doctor.  Follow instructions from your doctor about which positions or movements you should avoid.  Drink enough fluid to keep your pee (urine) clear or pale yellow.  Keep all follow-up visits as told by your doctor. This is important. Contact a doctor if:  Medicine does not help your vertigo.  You have a fever.  Your problems get worse or you have new symptoms.  Your family or friends see changes in your behavior.  You feel sick to your stomach (nauseous) or you throw up (vomit).  You have a "pins and needles" feeling or you are numb in part of your body. Get help right away if:  You have trouble moving or talking.  You are always dizzy.  You pass out (faint).  You get very bad headaches.  You feel weak or  have trouble using your hands, arms, or legs.  You have changes in your hearing.  You have changes in your seeing (vision).  You get a stiff neck.  Bright light starts to bother you. This information is not intended to replace advice given to you by your health care provider. Make sure you discuss any questions you have with your health care provider. Document Released: 06/08/2008 Document Revised: 02/05/2016 Document  Reviewed: 12/23/2014 Elsevier Interactive Patient Education  [employer].

## 2017-12-20 ENCOUNTER — Other Ambulatory Visit: Payer: Self-pay | Admitting: Physician Assistant

## 2017-12-20 LAB — CBC WITH DIFFERENTIAL/PLATELET
BASOS PCT: 1.3 %
Basophils Absolute: 70 cells/uL (ref 0–200)
Eosinophils Absolute: 189 cells/uL (ref 15–500)
Eosinophils Relative: 3.5 %
HCT: 41.5 % (ref 35.0–45.0)
Hemoglobin: 13.7 g/dL (ref 11.7–15.5)
Lymphs Abs: 994 cells/uL (ref 850–3900)
MCH: 29.2 pg (ref 27.0–33.0)
MCHC: 33 g/dL (ref 32.0–36.0)
MCV: 88.5 fL (ref 80.0–100.0)
MONOS PCT: 15.6 %
MPV: 10 fL (ref 7.5–12.5)
NEUTROS ABS: 3305 {cells}/uL (ref 1500–7800)
Neutrophils Relative %: 61.2 %
PLATELETS: 255 10*3/uL (ref 140–400)
RBC: 4.69 10*6/uL (ref 3.80–5.10)
RDW: 13.1 % (ref 11.0–15.0)
TOTAL LYMPHOCYTE: 18.4 %
WBC mixed population: 842 cells/uL (ref 200–950)
WBC: 5.4 10*3/uL (ref 3.8–10.8)

## 2017-12-20 LAB — URINE CULTURE
MICRO NUMBER:: 90430851
SPECIMEN QUALITY: ADEQUATE

## 2017-12-20 LAB — URINALYSIS, ROUTINE W REFLEX MICROSCOPIC
BILIRUBIN URINE: NEGATIVE
Glucose, UA: NEGATIVE
Hgb urine dipstick: NEGATIVE
KETONES UR: NEGATIVE
Leukocytes, UA: NEGATIVE
Nitrite: NEGATIVE
PH: 8 (ref 5.0–8.0)
Protein, ur: NEGATIVE
SPECIFIC GRAVITY, URINE: 1.005 (ref 1.001–1.03)

## 2017-12-20 LAB — BASIC METABOLIC PANEL WITH GFR
BUN: 13 mg/dL (ref 7–25)
CALCIUM: 9.8 mg/dL (ref 8.6–10.4)
CHLORIDE: 101 mmol/L (ref 98–110)
CO2: 29 mmol/L (ref 20–32)
Creat: 0.91 mg/dL (ref 0.60–0.93)
GFR, Est African American: 72 mL/min/{1.73_m2} (ref >=60)
GFR, Est Non African American: 62 mL/min/{1.73_m2} (ref >=60)
Glucose, Bld: 87 mg/dL (ref 65–99)
Potassium: 4.1 mmol/L (ref 3.5–5.3)
Sodium: 137 mmol/L (ref 135–146)

## 2017-12-20 LAB — LIPID PANEL
CHOLESTEROL: 240 mg/dL — AB (ref <200)
HDL: 56 mg/dL (ref >50)
LDL CHOLESTEROL (CALC): 160 mg/dL — AB
Non-HDL Cholesterol (Calc): 184 mg/dL (calc) — ABNORMAL HIGH (ref <130)
TRIGLYCERIDES: 119 mg/dL (ref <150)
Total CHOL/HDL Ratio: 4.3 (calc) (ref <5.0)

## 2017-12-20 LAB — HEPATIC FUNCTION PANEL
AG Ratio: 2 (calc) (ref 1.0–2.5)
ALBUMIN MSPROF: 4.4 g/dL (ref 3.6–5.1)
ALT: 13 U/L (ref 6–29)
AST: 25 U/L (ref 10–35)
Alkaline phosphatase (APISO): 65 U/L (ref 33–130)
Bilirubin, Direct: 0.1 mg/dL (ref 0.0–0.2)
Globulin: 2.2 g/dL (calc) (ref 1.9–3.7)
Indirect Bilirubin: 0.4 mg/dL (calc) (ref 0.2–1.2)
TOTAL PROTEIN: 6.6 g/dL (ref 6.1–8.1)
Total Bilirubin: 0.5 mg/dL (ref 0.2–1.2)

## 2017-12-20 LAB — TSH: TSH: 2.64 mIU/L (ref 0.40–4.50)

## 2017-12-20 MED ORDER — ROSUVASTATIN CALCIUM 10 MG PO TABS: ORAL_TABLET | ORAL | 3 refills | Status: DC

## 2017-12-21 ENCOUNTER — Telehealth: Payer: Self-pay

## 2017-12-21 NOTE — Telephone Encounter (Signed)
-----   Message from Vicie Mutters, [place] sent at 12/20/2017  4:57 PM EDT ----- Regarding: RE: meds Sent in 1 pill to take only 2 days a week like Tuesday and Thursday, will recheck at next office visit.  Estill Bamberg  ----- Message ----- From: Elenor Quinones, CMA Sent: 12/20/2017   4:08 PM To: Vicie Mutters, PA-C Subject: meds                                           Pt states that she has thought about it & would like to start the meds for her cholesterol. Pt states she would like it to be the lowest dose as possible.

## 2017-12-21 NOTE — Telephone Encounter (Signed)
Pt has been inform about meds for cholesterol. Pt voiced understanding & hung up.

## 2017-12-22 ENCOUNTER — Ambulatory Visit: Payer: Self-pay | Admitting: Adult Health

## 2017-12-28 ENCOUNTER — Ambulatory Visit
Admission: RE | Admit: 2017-12-28 | Discharge: 2017-12-28 | Disposition: A | Discharge status: Home / Self Care | Payer: PPO | Source: Ambulatory Visit | Attending: Physician Assistant | Admitting: Physician Assistant

## 2017-12-28 DIAGNOSIS — R29818 Other symptoms and signs involving the nervous system: Secondary | ICD-10-CM

## 2017-12-28 DIAGNOSIS — R269 Unspecified abnormalities of gait and mobility: Secondary | ICD-10-CM | POA: Diagnosis not present

## 2017-12-28 DIAGNOSIS — R410 Disorientation, unspecified: Secondary | ICD-10-CM | POA: Diagnosis not present

## 2017-12-28 MED ORDER — GADOBENATE DIMEGLUMINE 529 MG/ML IV SOLN
10.0000 mL | Freq: Once | INTRAVENOUS | Status: AC | PRN
  Administered 2017-12-28: 10 mL via INTRAVENOUS

## 2017-12-29 ENCOUNTER — Other Ambulatory Visit: Payer: Self-pay | Admitting: Physician Assistant

## 2017-12-29 ENCOUNTER — Encounter: Payer: Self-pay | Admitting: Internal Medicine

## 2017-12-29 DIAGNOSIS — R9389 Abnormal findings on diagnostic imaging of other specified body structures: Secondary | ICD-10-CM

## 2017-12-29 DIAGNOSIS — R251 Tremor, unspecified: Secondary | ICD-10-CM

## 2017-12-29 DIAGNOSIS — R2689 Other abnormalities of gait and mobility: Secondary | ICD-10-CM

## 2018-01-02 ENCOUNTER — Ambulatory Visit (INDEPENDENT_AMBULATORY_CARE_PROVIDER_SITE_OTHER): Payer: PPO

## 2018-01-02 DIAGNOSIS — R002 Palpitations: Secondary | ICD-10-CM | POA: Diagnosis not present

## 2018-01-03 ENCOUNTER — Encounter: Payer: Self-pay | Admitting: Neurology

## 2018-01-05 ENCOUNTER — Inpatient Hospital Stay: Payer: PPO

## 2018-01-05 ENCOUNTER — Inpatient Hospital Stay: Payer: PPO | Attending: Hematology & Oncology | Admitting: Hematology & Oncology

## 2018-01-05 ENCOUNTER — Other Ambulatory Visit: Payer: Self-pay

## 2018-01-05 VITALS — BP 128/60 | HR 70 | Temp 98.1°F | Resp 16

## 2018-01-05 VITALS — BP 124/69 | HR 66 | Temp 97.8°F | Resp 17 | Wt 123.2 lb

## 2018-01-05 DIAGNOSIS — C8312 Mantle cell lymphoma, intrathoracic lymph nodes: Secondary | ICD-10-CM

## 2018-01-05 DIAGNOSIS — D801 Nonfamilial hypogammaglobulinemia: Secondary | ICD-10-CM

## 2018-01-05 DIAGNOSIS — R5381 Other malaise: Secondary | ICD-10-CM | POA: Diagnosis not present

## 2018-01-05 DIAGNOSIS — C8311 Mantle cell lymphoma, lymph nodes of head, face, and neck: Secondary | ICD-10-CM

## 2018-01-05 DIAGNOSIS — R5383 Other fatigue: Secondary | ICD-10-CM | POA: Insufficient documentation

## 2018-01-05 DIAGNOSIS — Z9225 Personal history of immunosupression therapy: Secondary | ICD-10-CM | POA: Diagnosis not present

## 2018-01-05 DIAGNOSIS — D500 Iron deficiency anemia secondary to blood loss (chronic): Secondary | ICD-10-CM

## 2018-01-05 DIAGNOSIS — Z79899 Other long term (current) drug therapy: Secondary | ICD-10-CM | POA: Insufficient documentation

## 2018-01-05 DIAGNOSIS — J479 Bronchiectasis, uncomplicated: Secondary | ICD-10-CM | POA: Insufficient documentation

## 2018-01-05 DIAGNOSIS — C8310 Mantle cell lymphoma, unspecified site: Secondary | ICD-10-CM | POA: Diagnosis not present

## 2018-01-05 DIAGNOSIS — Z9221 Personal history of antineoplastic chemotherapy: Secondary | ICD-10-CM | POA: Diagnosis not present

## 2018-01-05 LAB — CBC WITH DIFFERENTIAL (CANCER CENTER ONLY)
BASOS ABS: 0 10*3/uL (ref 0.0–0.1)
Basophils Relative: 1 %
EOS ABS: 0.2 10*3/uL (ref 0.0–0.5)
Eosinophils Relative: 3 %
HCT: 39.9 % (ref 34.8–46.6)
Hemoglobin: 13.3 g/dL (ref 11.6–15.9)
LYMPHS ABS: 0.9 10*3/uL (ref 0.9–3.3)
LYMPHS PCT: 13 %
MCH: 29.9 pg (ref 26.0–34.0)
MCHC: 33.3 g/dL (ref 32.0–36.0)
MCV: 89.7 fL (ref 81.0–101.0)
MONO ABS: 0.9 10*3/uL (ref 0.1–0.9)
Monocytes Relative: 14 %
Neutro Abs: 4.9 10*3/uL (ref 1.5–6.5)
Neutrophils Relative %: 69 %
PLATELETS: 210 10*3/uL (ref 145–400)
RBC: 4.45 MIL/uL (ref 3.70–5.32)
RDW: 14.1 % (ref 11.1–15.7)
WBC: 6.9 10*3/uL (ref 3.9–10.0)

## 2018-01-05 LAB — CMP (CANCER CENTER ONLY)
ALT: 22 U/L (ref 10–47)
ANION GAP: 7 (ref 5–15)
AST: 32 U/L (ref 11–38)
Albumin: 3.7 g/dL (ref 3.5–5.0)
Alkaline Phosphatase: 66 U/L (ref 26–84)
BUN: 13 mg/dL (ref 7–22)
CO2: 29 mmol/L (ref 18–33)
Calcium: 9.6 mg/dL (ref 8.0–10.3)
Chloride: 102 mmol/L (ref 98–108)
Creatinine: 0.5 mg/dL — ABNORMAL LOW (ref 0.60–1.20)
GLUCOSE: 93 mg/dL (ref 73–118)
POTASSIUM: 3.9 mmol/L (ref 3.3–4.7)
SODIUM: 138 mmol/L (ref 128–145)
Total Bilirubin: 0.8 mg/dL (ref 0.2–1.6)
Total Protein: 6.5 g/dL (ref 6.4–8.1)

## 2018-01-05 MED ORDER — DIPHENHYDRAMINE HCL 25 MG PO CAPS
25.0000 mg | ORAL_CAPSULE | Freq: Once | ORAL | Status: AC
  Administered 2018-01-05: 25 mg via ORAL

## 2018-01-05 MED ORDER — FAMOTIDINE IN NACL 20-0.9 MG/50ML-% IV SOLN
40.0000 mg | Freq: Once | INTRAVENOUS | Status: AC
  Administered 2018-01-05: 40 mg via INTRAVENOUS

## 2018-01-05 MED ORDER — DIPHENHYDRAMINE HCL 25 MG PO CAPS
ORAL_CAPSULE | ORAL | Status: AC
  Filled 2018-01-05: qty 2

## 2018-01-05 MED ORDER — FAMOTIDINE IN NACL 20-0.9 MG/50ML-% IV SOLN
INTRAVENOUS | Status: AC
  Filled 2018-01-05: qty 100

## 2018-01-05 MED ORDER — IMMUNE GLOBULIN (HUMAN) 20 GM/200ML IV SOLN
40.0000 g | Freq: Once | INTRAVENOUS | Status: DC
  Filled 2018-01-05: qty 400

## 2018-01-05 MED ORDER — ACETAMINOPHEN 325 MG PO TABS
ORAL_TABLET | ORAL | Status: AC
  Filled 2018-01-05: qty 2

## 2018-01-05 MED ORDER — METHYLPREDNISOLONE SODIUM SUCC 125 MG IJ SOLR
INTRAMUSCULAR | Status: AC
  Filled 2018-01-05: qty 2

## 2018-01-05 MED ORDER — ACETAMINOPHEN 325 MG PO TABS
650.0000 mg | ORAL_TABLET | Freq: Once | ORAL | Status: AC
  Administered 2018-01-05: 650 mg via ORAL

## 2018-01-05 MED ORDER — IMMUNE GLOBULIN (HUMAN) 20 GM/200ML IV SOLN
40.0000 g | Freq: Once | INTRAVENOUS | Status: AC
  Administered 2018-01-05: 40 g via INTRAVENOUS
  Filled 2018-01-05: qty 400

## 2018-01-05 MED ORDER — SODIUM CHLORIDE 0.9 % IV SOLN: 40.0000 mg | Freq: Once | INTRAVENOUS | Status: DC

## 2018-01-05 MED ORDER — METHYLPREDNISOLONE SODIUM SUCC 125 MG IJ SOLR
125.0000 mg | Freq: Once | INTRAMUSCULAR | Status: AC
  Administered 2018-01-05: 125 mg via INTRAVENOUS

## 2018-01-05 NOTE — Patient Instructions (Signed)
Implanted Port Home Guide An implanted port is a type of central line that is placed under the skin. Central lines are used to provide IV access when treatment or nutrition needs to be given through a person's veins. Implanted ports are used for long-term IV access. An implanted port may be placed because:  You need IV medicine that would be irritating to the small veins in your hands or arms.  You need long-term IV medicines, such as antibiotics.  You need IV nutrition for a long period.  You need frequent blood draws for lab tests.  You need dialysis.  Implanted ports are usually placed in the chest area, but they can also be placed in the upper arm, the abdomen, or the leg. An implanted port has two main parts:  Reservoir. The reservoir is round and will appear as a small, raised area under your skin. The reservoir is the part where a needle is inserted to give medicines or draw blood.  Catheter. The catheter is a thin, flexible tube that extends from the reservoir. The catheter is placed into a large vein. Medicine that is inserted into the reservoir goes into the catheter and then into the vein.  How will I care for my incision site? Do not get the incision site wet. Bathe or shower as directed by your health care provider. How is my port accessed? Special steps must be taken to access the port:  Before the port is accessed, a numbing cream can be placed on the skin. This helps numb the skin over the port site.  Your health care provider uses a sterile technique to access the port. ? Your health care provider must put on a mask and sterile gloves. ? The skin over your port is cleaned carefully with an antiseptic and allowed to dry. ? The port is gently pinched between sterile gloves, and a needle is inserted into the port.  Only "non-coring" port needles should be used to access the port. Once the port is accessed, a blood return should be checked. This helps ensure that the port  is in the vein and is not clogged.  If your port needs to remain accessed for a constant infusion, a clear (transparent) bandage will be placed over the needle site. The bandage and needle will need to be changed every week, or as directed by your health care provider.  Keep the bandage covering the needle clean and dry. Do not get it wet. Follow your health care provider's instructions on how to take a shower or bath while the port is accessed.  If your port does not need to stay accessed, no bandage is needed over the port.  What is flushing? Flushing helps keep the port from getting clogged. Follow your health care provider's instructions on how and when to flush the port. Ports are usually flushed with saline solution or a medicine called heparin. The need for flushing will depend on how the port is used.  If the port is used for intermittent medicines or blood draws, the port will need to be flushed: ? After medicines have been given. ? After blood has been drawn. ? As part of routine maintenance.  If a constant infusion is running, the port may not need to be flushed.  How long will my port stay implanted? The port can stay in for as long as your health care provider thinks it is needed. When it is time for the port to come out, surgery will be   done to remove it. The procedure is similar to the one performed when the port was put in. When should I seek immediate medical care? When you have an implanted port, you should seek immediate medical care if:  You notice a bad smell coming from the incision site.  You have swelling, redness, or drainage at the incision site.  You have more swelling or pain at the port site or the surrounding area.  You have a fever that is not controlled with medicine.  This information is not intended to replace advice given to you by your health care provider. Make sure you discuss any questions you have with your health care provider. Document  Released: 08/30/2005 Document Revised: 02/05/2016 Document Reviewed: 05/07/2013 Elsevier Interactive Patient Education  2017 Elsevier Inc.  

## 2018-01-05 NOTE — Progress Notes (Signed)
Hematology and Oncology Follow Up Visit  Shannon Parsons [id number] [birth year] 77 y.o. 01/05/2018   Principle Diagnosis:   Mantle cell lymphoma (MCIPI = 5)  Thromboembolic event of the right leg  Acquired Hypogammaglobulinemia  Bronchiectasis  Current Therapy:    Rituxan - maintanence - s/p cycle #10 - to finish in 07/2017  IVIG - q  month -restart on 10/27/2017; last dose 01/05/2018     Interim History:  Ms.  Shannon Parsons is back for follow-up.  She is doing okay.  Unfortunately, it looks like she may have Parkinson's.  She had an MRI done last week.  MRI was done of the brain.  This did show some changes in the midbrain where the basal ganglia are.  She sees a neurologist in a couple weeks.  She is had no problems with her bronchiectasis.  The IVIG really has helped this.  She has had no fever.  She has had no nausea or vomiting.  She has had no change in bowel or bladder habits.  She has had no rashes.  She has had no leg swelling.  Overall, her performance status is ECOG 1.   Medications:  Current Outpatient Medications:  .  acetaminophen (TYLENOL) 325 MG tablet, Take 325 mg by mouth every 6 (six) hours as needed for moderate pain (For pain.). , Disp: , Rfl:  .  budesonide-formoterol (SYMBICORT) 80-4.5 MCG/ACT inhaler, Take 2 puffs first thing in am and then another 2 puffs about 12 hours later., Disp: 1 Inhaler, Rfl: 11 .  Cholecalciferol (VITAMIN D3) 5000 units CAPS, Take 10,000 Units by mouth daily., Disp: , Rfl:  .  diazepam (VALIUM) 5 MG tablet, Take 1/2 to 1  tablet 3 x / day for Tremor, Anxiety or Sleep (Patient taking differently: Take 2.5 mg by mouth at bedtime as needed for anxiety or sedation. Take 1/2 to 1  tablet 3 x / day for Tremor, Anxiety or Sleep), Disp: 270 tablet, Rfl: 1 .  hyoscyamine (LEVSIN, ANASPAZ) 0.125 MG tablet, Take 1 to 2 tablets every 4 hours as needed for nausea, cramping, bloating or diarrhea (Patient taking differently: Take 0.125 mg by mouth  every 4 (four) hours as needed for cramping. ), Disp: 90 tablet, Rfl: 11 .  ipratropium-albuterol (DUONEB) 0.5-2.5 (3) MG/3ML SOLN, Take 3 mLs by nebulization every 4 (four) hours as needed (heaviness in chest). , Disp: , Rfl:  .  levothyroxine (SYNTHROID, LEVOTHROID) 50 MCG tablet, TAKE 1 TABLET BY MOUTH ONCE DAILY., Disp: 90 tablet, Rfl: 4 .  lidocaine-prilocaine (EMLA) cream, Apply 1 application topically as needed. Apply to [facility] one hour before access., Disp: 30 g, Rfl: 3 .  PROAIR HFA 108 (90 Base) MCG/ACT inhaler, INHALE 2 PUFFS EVERY 4 HOURS AS NEEDED INTO THE LUNGS FOR WHEEZING OR SHORTNESS OF BREATH., Disp: 48 g, Rfl: 1 .  Probiotic Product (PROBIOTIC DAILY PO), Take 1 capsule by mouth daily., Disp: , Rfl:  .  Respiratory Therapy Supplies (FLUTTER) DEVI, Take as directed, Disp: 1 each, Rfl: 0 .  rosuvastatin (CRESTOR) 10 MG tablet, 1 pill 2 times a week, Disp: 30 tablet, Rfl: 3 No current facility-administered medications for this visit.   Facility-Administered Medications Ordered in Other Visits:  .  ipratropium-albuterol (DUONEB) 0.5-2.5 (3) MG/3ML nebulizer solution 3 mL, 3 mL, Nebulization, Q6H, [place], Holli Humbles, NP  Allergies:  Allergies  Allergen Reactions  . Alendronate Other (See Comments)    "made me choke" "made me choke" chokes  . Risedronate Sodium Other (See Comments)    "  made me choke"  "made me choke"   . Sulfa Antibiotics Other (See Comments)    Doesn't remember   . Sulfasalazine Other (See Comments)    Doesn't remember   . Tetracyclines & Related Rash    Past Medical History, Surgical history, Social history, and Family History were reviewed and updated.  Review of Systems: Review of Systems  Constitutional: Positive for malaise/fatigue.  HENT: Positive for congestion.   Eyes: Negative.   Respiratory: Positive for shortness of breath and wheezing.   Cardiovascular: Negative.   Gastrointestinal: Negative.   Genitourinary: Negative.     Musculoskeletal: Negative.   Skin: Negative.   Neurological: Negative.   Endo/Heme/Allergies: Negative.   Psychiatric/Behavioral: Negative.     Physical Exam:  vitals were not taken for this visit.   Physical Exam  Constitutional: She is oriented to person, place, and time.  HENT:  Head: Normocephalic and atraumatic.  Mouth/Throat: Oropharynx is clear and moist.  Eyes: Pupils are equal, round, and reactive to light. EOM are normal.  Neck: Normal range of motion.  Cardiovascular: Normal rate, regular rhythm and normal heart sounds.  Pulmonary/Chest: Effort normal and breath sounds normal.  Abdominal: Soft. Bowel sounds are normal.  Musculoskeletal: Normal range of motion. She exhibits no edema, tenderness or deformity.  Lymphadenopathy:    She has no cervical adenopathy.  Neurological: She is alert and oriented to person, place, and time.  Skin: Skin is warm and dry. No rash noted. No erythema.  Psychiatric: She has a normal mood and affect. Her behavior is normal. Judgment and thought content normal.  Vitals reviewed.    Lab Results  Component Value Date   WBC 6.9 01/05/2018   HGB 13.3 01/05/2018   HCT 39.9 01/05/2018   MCV 89.7 01/05/2018   PLT 210 01/05/2018     Chemistry      Component Value Date/Time   NA 137 12/19/2017 0935   NA 141 07/13/2017 0743   NA 138 08/01/2015 1124   K 4.1 12/19/2017 0935   K 4.2 07/13/2017 0743   K 4.0 08/01/2015 1124   CL 101 12/19/2017 0935   CL 105 07/13/2017 0743   CO2 29 12/19/2017 0935   CO2 27 07/13/2017 0743   CO2 28 08/01/2015 1124   BUN 13 12/19/2017 0935   BUN 16 07/13/2017 0743   BUN 10.4 08/01/2015 1124   CREATININE 0.91 12/19/2017 0935   CREATININE 0.8 08/01/2015 1124      Component Value Date/Time   CALCIUM 9.8 12/19/2017 0935   CALCIUM 9.5 07/13/2017 0743   CALCIUM 9.6 08/01/2015 1124   ALKPHOS 64 11/24/2017 0810   ALKPHOS 68 07/13/2017 0743   ALKPHOS 88 08/01/2015 1124   AST 25 12/19/2017 0935   AST 30  11/24/2017 0810   AST 23 08/01/2015 1124   ALT 13 12/19/2017 0935   ALT 17 11/24/2017 0810   ALT 19 07/13/2017 0743   ALT 12 08/01/2015 1124   BILITOT 0.5 12/19/2017 0935   BILITOT 0.6 11/24/2017 0810   BILITOT <0.30 08/01/2015 1124      Impression and Plan: Ms. Murdock is 77 year old white female. She has mantle cell lymphoma. Again, by the [facility] she is an intermediate risk.  She received 2 different courses of chemotherapy. She could not tolerate R-CHOP with Velcade substituting for vincristine. This however was incredibly helpful. We finished up with Rituxan/bendamustine. She completed this in August 2016.  We then started maintenance Rituxan. She started this in November 2016.  She completed  this in November 2018.  For right now, everything looks okay with respect to the mantle cell lymphoma.  Because she is doing well, we will give her one last dose of IVIG.  After this, she will be followed.  She is not happy about being on a statin drug.  She does have a high cholesterol.  Her HDL is not too bad.  We will plan to get her back in 3 months.  This is reasonable.   Volanda Napoleon, MD 4/25/20198:20 AM

## 2018-01-05 NOTE — Patient Instructions (Signed)

## 2018-01-06 LAB — IGG, IGA, IGM
IGA: 27 mg/dL — AB (ref 64–422)
IgG (Immunoglobin G), Serum: 899 mg/dL (ref 700–1600)
IgM (Immunoglobulin M), Srm: 6 mg/dL — ABNORMAL LOW (ref 26–217)

## 2018-01-09 LAB — PROTEIN ELECTROPHORESIS, SERUM, WITH REFLEX
A/G Ratio: 1.4 (ref 0.7–1.7)
ALBUMIN ELP: 3.7 g/dL (ref 2.9–4.4)
ALPHA-1-GLOBULIN: 0.3 g/dL (ref 0.0–0.4)
Alpha-2-Globulin: 0.6 g/dL (ref 0.4–1.0)
Beta Globulin: 1 g/dL (ref 0.7–1.3)
GAMMA GLOBULIN: 0.8 g/dL (ref 0.4–1.8)
Globulin, Total: 2.6 g/dL (ref 2.2–3.9)
TOTAL PROTEIN ELP: 6.3 g/dL (ref 6.0–8.5)

## 2018-01-10 ENCOUNTER — Telehealth: Payer: Self-pay | Admitting: Internal Medicine

## 2018-01-10 ENCOUNTER — Telehealth: Payer: Self-pay

## 2018-01-10 DIAGNOSIS — G4733 Obstructive sleep apnea (adult) (pediatric): Secondary | ICD-10-CM | POA: Diagnosis not present

## 2018-01-10 NOTE — Telephone Encounter (Signed)
Return call no answer at this time. April 30th 2019 by DD

## 2018-01-10 NOTE — Telephone Encounter (Signed)
returned your call on labs, please return call

## 2018-01-10 NOTE — Telephone Encounter (Signed)
Pt reports that she has taken a total of 6 pills of crestor so far & is now having leg pain. Could this be from the meds. Pt reports she only takes the crestor 2 a wk. Please advise?   Spoke with provider & informed provider that the pt only takes it twice a wk. Pt can stop the crestor & see if this helps.   Pt voiced understanding but would like to know if she should get a scan.  Please Advise?  I know we spoke but I just wanted get a chance to sit down & look at what the plan was due to her bld clots that we spoke about & if there was anything that I needed to maybe call the pt about. Just in case I missed something.

## 2018-01-10 NOTE — Telephone Encounter (Signed)
Patient with history of DVT to right leg, if any swelling, warmth, redness in her leg she needs to go to ER or make an office visit here.

## 2018-01-12 NOTE — Progress Notes (Signed)
Shannon Parsons was seen today in the movement disorders clinic for neurologic consultation at the request of Vicie Mutters, [place].  The consultation is for the evaluation of abnormal brain scan/PD.  The records that were made available to me were reviewed.  Pt has a long hx of ET, documented in med record since at least 2015.  More recently, the patient had an MRI of the brain on December 28, 2017 because of unsteady gait as well as head tremor per records but pt says it was for a one time near syncopal episode.  Radiologist impression indicate "new abnormal brain signal associated with Parkinson's disease though, may be treatment related given history of lymphoma, less likely Wernicke's."  I reviewed this MRI myself.  There was T2 hyperintensity within the midbrain, but the STN was visible and looked good.  Tremor: Yes.     How long has it been going on? 8+ years  When is it noted the most?  When nervous or excited.  Hasn't changed much over 8 years.  Fam hx of tremor?  Yes.  , brother and mother (in head for both)  Located where?  Head/neck.  Doesn't recall much in hands  Affected by caffeine:  No. (1 cup coffee/day)  Affected by alcohol:  Doesn't drink  Affected by stress:  Yes.    Affected by fatigue:  Yes.    Affects ADL's (tying shoes, brushing teeth, etc):  No.  Tremor inducing meds:  No.   Doesn't think worse when looking in one direction  Other Specific Symptoms:  Voice: no, "my husband states I am getting louder" Sleep: CPAP faitherfully  Vivid Dreams:  No., does dream a lot though  Acting out dreams:  No. Wet Pillows: No. Postural symptoms:  Yes.  , minor when walking - may bump into wall "a tiny bit"  Falls?  No. Bradykinesia symptoms: no bradykinesia noted Loss of smell:  No. Loss of taste:  No. Urinary Incontinence:  No. Difficulty Swallowing:  No. Handwriting, micrographia: No. Depression:  Yes.   Memory changes:  No. N/V:  No. - was having episodes of emesis but  better now (was every 2 months was having episode of emesis) Lightheaded:  Yes.   - only 1 episode.  Was sitting in chair and felt near syncopal.  She yelled at her husband to help her and by the time he came, she felt normal.  Never another episode but that is what led to MRI brain  Syncope: No. Diplopia:  No. Dyskinesia:  No.    ALLERGIES:   Allergies  Allergen Reactions  . Alendronate Other (See Comments)    "made me choke" "made me choke" chokes  . Risedronate Sodium Other (See Comments)    "made me choke"  "made me choke"   . Sulfa Antibiotics Other (See Comments)    Doesn't remember   . Sulfasalazine Other (See Comments)    Doesn't remember   . Other Rash  . Tetracyclines & Related Rash    CURRENT MEDICATIONS:  Outpatient Encounter Medications as of 01/16/2018  Medication Sig  . acetaminophen (TYLENOL) 325 MG tablet Take 325 mg by mouth every 6 (six) hours as needed for moderate pain (For pain.).   [name] Cholecalciferol (VITAMIN D3) 5000 units CAPS Take 10,000 Units by mouth daily.  . diazepam (VALIUM) 5 MG tablet Take 1/2 to 1  tablet 3 x / day for Tremor, Anxiety or Sleep (Patient taking differently: Take 2.5 mg by mouth at bedtime as  needed for anxiety or sedation. Take 1/2 to 1  tablet 3 x / day for Tremor, Anxiety or Sleep)  . hyoscyamine (LEVSIN, ANASPAZ) 0.125 MG tablet Take 1 to 2 tablets every 4 hours as needed for nausea, cramping, bloating or diarrhea (Patient taking differently: Take 0.125 mg by mouth every 4 (four) hours as needed for cramping. )  . ipratropium-albuterol (DUONEB) 0.5-2.5 (3) MG/3ML SOLN Take 3 mLs by nebulization every 4 (four) hours as needed (heaviness in chest).   [name] levothyroxine (SYNTHROID, LEVOTHROID) 50 MCG tablet TAKE 1 TABLET BY MOUTH ONCE DAILY.  [name] lidocaine-prilocaine (EMLA) cream Apply 1 application topically as needed. Apply to [facility] one hour before access.  . Multiple Vitamins-Minerals (OCUVITE PO) Take by mouth.  [name] PROAIR HFA 108 (90  Base) MCG/ACT inhaler INHALE 2 PUFFS EVERY 4 HOURS AS NEEDED INTO THE LUNGS FOR WHEEZING OR SHORTNESS OF BREATH.  . Probiotic Product (PROBIOTIC DAILY PO) Take 1 capsule by mouth daily.  [name] Respiratory Therapy Supplies (FLUTTER) DEVI Take as directed  . rosuvastatin (CRESTOR) 10 MG tablet 1 pill 2 times a week  . [DISCONTINUED] budesonide-formoterol (SYMBICORT) 80-4.5 MCG/ACT inhaler Take 2 puffs first thing in am and then another 2 puffs about 12 hours later.   Facility-Administered Encounter Medications as of 01/16/2018  Medication  . ipratropium-albuterol (DUONEB) 0.5-2.5 (3) MG/3ML nebulizer solution 3 mL    PAST MEDICAL HISTORY:   Past Medical History:  Diagnosis Date  . Adenomatous colon polyp 1994  . Asthma   . Cataract 2013   bilateral eyes  . Diverticulosis   . Family history of ischemic heart disease   . Fibrocystic breast disease   . GERD (gastroesophageal reflux disease)   . Hiatal hernia   . Hyperlipidemia   . Hypertension    pt denies - 01/16/18  . IBS (irritable bowel syndrome)   . Internal hemorrhoids   . Mantle cell lymphoma ([place]) 10/18/2014  . Melanoma ([place])    Facial, pt denies on 01/16/18  . OSA (obstructive sleep apnea)   . Osteoarthritis   . Osteoporosis   . Prediabetes   . Unspecified hypothyroidism   . Vitamin D deficiency     PAST SURGICAL HISTORY:   Past Surgical History:  Procedure Laterality Date  . APPENDECTOMY    . BUNIONECTOMY    . CATARACT EXTRACTION, BILATERAL    . KNEE ARTHROSCOPY    . TONSILLECTOMY    . UMBILICAL HERNIA REPAIR    . VIDEO BRONCHOSCOPY Bilateral 10/12/2017   Procedure: VIDEO BRONCHOSCOPY WITHOUT FLUORO;  Surgeon: Tanda Rockers, MD;  Location: [facility] ENDOSCOPY;  Service: Endoscopy;  Laterality: Bilateral;    SOCIAL HISTORY:   Social History   Socioeconomic History  . Marital status: Married    Spouse name: Not on file  . Number of children: 0  . Years of education: 12  . Highest education level: Not on file    Occupational History  . Occupation: retired from office work  Social Needs  . Financial resource strain: Not on file  . Food insecurity:    Worry: Not on file    Inability: Not on file  . Transportation needs:    Medical: Not on file    Non-medical: Not on file  Tobacco Use  . Smoking status: Never Smoker  . Smokeless tobacco: Never Used  . Tobacco comment: Never Used Tobacco  Substance and Sexual Activity  . Alcohol use: No    Alcohol/week: 0.0 oz  . Drug use: No  .  Sexual activity: Not on file  Lifestyle  . Physical activity:    Days per week: Not on file    Minutes per session: Not on file  . Stress: Not on file  Relationships  . Social connections:    Talks on phone: Not on file    Gets together: Not on file    Attends religious service: Not on file    Active member of club or organization: Not on file    Attends meetings of clubs or organizations: Not on file    Relationship status: Not on file  . Intimate partner violence:    Fear of current or ex partner: Not on file    Emotionally abused: Not on file    Physically abused: Not on file    Forced sexual activity: Not on file  Other Topics Concern  . Not on file  Social History Narrative   Patient lives with her husband in a two story home.  Has no children.  Retired from office work.  Education: some college.     FAMILY HISTORY:   Family Status  Relation Name Status  . Father  Deceased  . Brother  Deceased  . Sister  Alive  . Mother  Deceased  . Neg Hx  (Not Specified)    ROS:  Some MS chest pain but "doesn't last" and none now.  A complete 10 system review of systems was obtained and was unremarkable apart from what is mentioned above.  PHYSICAL EXAMINATION:    VITALS:   Vitals:   01/16/18 1230  BP: 110/74  Pulse: 68  SpO2: 97%  Weight: 123 lb 8 oz (56 kg)  Height: 5' 3.5" (1.613 m)    GEN:  The patient appears stated age and is in NAD. HEENT:  Normocephalic, atraumatic.  The mucous  membranes are moist. The superficial temporal arteries are without ropiness or tenderness. CV:  RRR Lungs:  CTAB Neck/HEME:  There are no carotid bruits bilaterally.    There is some hypertrophy of R SCM.  Head turned L and SB right, but mildly  Neurological examination:  Orientation: The patient is alert and oriented x3. Fund of knowledge is appropriate.  Recent and remote memory are intact.  Attention and concentration are normal.    Able to name objects and repeat phrases. Cranial nerves: There is good facial symmetry. Pupils are equal round and reactive to light bilaterally. Fundoscopic exam reveals clear margins bilaterally. Extraocular muscles are intact. The visual fields are full to confrontational testing. The speech is fluent and clear. Soft palate rises symmetrically and there is no tongue deviation. Hearing is intact to conversational tone. Sensation: Sensation is intact to light and pinprick throughout (facial, trunk, extremities). Vibration is decreased at the bilateral big toe but intact at the ankle. There is no extinction with double simultaneous stimulation. There is no sensory dermatomal level identified. Motor: Strength is 5/5 in the bilateral upper and lower extremities.   Shoulder shrug is equal and symmetric.  There is no pronator drift. Deep tendon reflexes: Deep tendon reflexes are 2/4 at the bilateral biceps, triceps, brachioradialis, patella and achilles. Plantar responses are downgoing bilaterally.  Movement examination: Tone: There is normal tone in the bilateral upper extremities.  The tone in the lower extremities is normal.  Abnormal movements: There is irregular head tremor Coordination:  There is no decremation with RAM's, with any form of RAMS, including alternating supination and pronation of the forearm, hand opening and closing, finger taps, heel taps  and toe taps. Gait and Station: The patient has no difficulty arising out of a deep-seated chair without the use  of the hands. The patient's stride length is normal.  Able to walk on heels and toes without trouble.  Able to tandem gait.    ASSESSMENT/PLAN:  1.  Cervical dystonia  -I talked to the patient about the nature and pathophysiology.   The primary muscles involved are the R SCM and L splenius and possible R levator scapulae.  We talked about treatments.  We talked about the value of botox.  The patient was educated on the botulinum toxin the black blox warning and given a copy of the botox patient medication guide.  The patient understands that this warning states that there have been reported cases of the Botox extending beyond the injection site and creating adverse effects, similar to those of botulism. This included loss of strength, trouble walking, hoarseness, trouble saying words clearly, loss of bladder control, trouble breathing, trouble swallowing, diplopia, blurry vision and ptosis. Most of the distant spread of Botox was happening in patients, primarily children, who received medication for spasticity or for cervical dystonia. The patient expressed understanding.  She isn't interested in proceeding as it doesn't really bother her that much.    -Pt education given to the patient  -I told the patient that I do not think that she has essential tremor  2.  Abnormal brain scan  -Reviewed images with the patient.  She has some mild full small vessel disease supratentorially.  I suspect that the abnormality seen in the midbrain are just a result of wallerian degeneration (hx of Hyperlipid.  HTN listed as PMHx but she denies).  I told her I saw no evidence of Parkinson's disease, nor can Parkinson's disease be diagnosed or implied by an MRI of the brain.  3.  I will see her back on an as-needed basis.  Greater than 50% of the 45-minute visit was spent in counseling with patient.    Cc:  Unk Pinto, MD

## 2018-01-16 ENCOUNTER — Encounter: Payer: Self-pay | Admitting: Neurology

## 2018-01-16 ENCOUNTER — Ambulatory Visit: Payer: PPO | Admitting: Neurology

## 2018-01-16 VITALS — BP 110/74 | HR 68 | Ht 63.5 in | Wt 123.5 lb

## 2018-01-16 DIAGNOSIS — G243 Spasmodic torticollis: Secondary | ICD-10-CM | POA: Diagnosis not present

## 2018-01-16 DIAGNOSIS — R9402 Abnormal brain scan: Secondary | ICD-10-CM | POA: Diagnosis not present

## 2018-01-16 DIAGNOSIS — M47812 Spondylosis without myelopathy or radiculopathy, cervical region: Secondary | ICD-10-CM | POA: Insufficient documentation

## 2018-01-16 DIAGNOSIS — E785 Hyperlipidemia, unspecified: Secondary | ICD-10-CM | POA: Diagnosis not present

## 2018-01-16 NOTE — Patient Instructions (Signed)
Your diagnosis is cervical dystonia.  I see no evidence of Parkinsons Disease.    Good to see you today!

## 2018-03-03 ENCOUNTER — Ambulatory Visit (INDEPENDENT_AMBULATORY_CARE_PROVIDER_SITE_OTHER): Payer: PPO | Admitting: Internal Medicine

## 2018-03-03 VITALS — BP 122/70 | HR 64 | Temp 97.3°F | Resp 16 | Ht 63.5 in | Wt 124.2 lb

## 2018-03-03 DIAGNOSIS — J4520 Mild intermittent asthma, uncomplicated: Secondary | ICD-10-CM | POA: Diagnosis not present

## 2018-03-03 DIAGNOSIS — J0140 Acute pansinusitis, unspecified: Secondary | ICD-10-CM | POA: Diagnosis not present

## 2018-03-03 MED ORDER — PREDNISONE 20 MG PO TABS: ORAL_TABLET | ORAL | 0 refills | Status: DC

## 2018-03-03 MED ORDER — AZITHROMYCIN 250 MG PO TABS: ORAL_TABLET | ORAL | 1 refills | Status: DC

## 2018-03-03 NOTE — Progress Notes (Signed)
Subjective:    Patient ID: Shannon Parsons, female    DOB: 14-Sep-1940, 77 y.o.   MRN: [id number]  HPI    This very nice 77 yo MWF mw/labile HTN, HLD, PreDm, Ess. Tremor, Vit D Def presented with 3-4 day hx/o hoarseness/laryngitis, wheezing and productive cough. Also, c/o head congestion and drainage.  Medication Sig  . acetaminophen  325 MG tablet Take  every 6  hrs as needed for moderate pain  . Cholecalciferol (VITAMIN D3) 5000 units CAPS Take 10,000 Units by mouth daily.  . diazepam (VALIUM) 5 MG tablet Take 2.5 mg by mouth at bedtime as needed   . hyoscyamine (LEVSIN, ANASPAZ) 0.125 MG tablet Take 0.125 mg  every 4  hrs as needed for cramping.   . DUONEB 0.5-2.5 (3) MG/3ML SOLN Take 3 mLs by neb every 4 hours as needed  . levothyroxine  50 MCG tablet TAKE 1 TABLET BY MOUTH ONCE DAILY.  [name] lidocaine-prilocaine (EMLA) cream 1 application topically as needed.   . Multiple Vitamins-Minerals (OCUVITE PO) Take by mouth.  [name] PROAIR HFA 108 (90 Base) MCG/ACT inhaler  2 PUFFS EVERY 4 HOURS AS NEEDED   . Probiotic Product (PROBIOTIC DAILY PO) Take 1 capsule by mouth daily.  [name] Respiratory Therapy Supplies (FLUTTER) DEVI Take as directed  . rosuvastatin (CRESTOR) 10 MG tablet 1 pill 2 times a week   Allergies  Allergen Reactions  . Alendronate Other (See Comments)    "made me choke" "made me choke" chokes  . Risedronate Sodium Other (See Comments)    "made me choke"  "made me choke"   . Sulfa Antibiotics Other (See Comments)    Doesn't remember   . Sulfasalazine Other (See Comments)    Doesn't remember   . Other Rash  . Tetracyclines & Related Rash   Past Medical History:  Diagnosis Date  . Adenomatous colon polyp 1994  . Asthma   . Cataract 2013   bilateral eyes  . Diverticulosis   . Family history of ischemic heart disease   . Fibrocystic breast disease   . GERD (gastroesophageal reflux disease)   . Hiatal hernia   . Hyperlipidemia   . Hypertension    pt denies - 01/16/18   . IBS (irritable bowel syndrome)   . Internal hemorrhoids   . Mantle cell lymphoma ([place]) 10/18/2014  . Melanoma ([place])    Facial, pt denies on 01/16/18  . OSA (obstructive sleep apnea)   . Osteoarthritis   . Osteoporosis   . Prediabetes   . Unspecified hypothyroidism   . Vitamin D deficiency      10 point systems review negative except as above.    Objective:   Physical Exam  BP 122/70   Pulse 64   Temp (!) 97.3 F (36.3 C)   Resp 16   Ht 5' 3.5" (1.613 m)   Wt 124 lb 3.2 oz (56.3 kg)   LMP 09/14/2014   BMI 21.66 kg/m   In No Distress. Congested cough. No Stridor.  HEENT -  (+) fronto-maxillary tenderness. ac's patent. TM's Nl. EOM's full. PERRLA. NasoOroPharynx clear. Neck - supple. Nl Thyroid. Carotids 2+ & No bruits, nodes, JVD Chest - Scattered rales & rhonchi with post tussive expiratory wheezes. Cor - Nl HS. RRR w/o sig MGR. PP 1(+). No edema. Abd - No palpable organomegaly, masses or tenderness. BS nl. MS- FROM w/o deformities. Muscle power, tone and bulk Nl. Gait Nl. Neuro - No obvious Cr N abnormalities. Sensory, motor and  Cerebellar functions appear Nl w/o focal abnormalities. Psyche - Mental status normal & appropriate.  No delusions, ideations or obvious mood abnormalities. Skin - exposed clear w/o rash cyanosis or icterus.    Assessment & Plan:   1. Mild intermittent asthmatic bronchitis without complication  2. Subacute pansinusitis  - predniSONE (DELTASONE) 20 MG tablet; 1 tab 3 x day for 3 days, then 1 tab 2 x day for 3 days, then 1 tab 1 x day for 5 days  Dispense: 20 tablet  - azithromycin (ZITHROMAX) 250 MG tablet; Take 2 tablets (500 mg) on  Day 1,  followed by 1 tablet (250 mg) once daily on Days 2 through 5.  Dispense: 6 each; Refill: 1

## 2018-03-05 ENCOUNTER — Encounter: Payer: Self-pay | Admitting: Internal Medicine

## 2018-03-20 ENCOUNTER — Ambulatory Visit (INDEPENDENT_AMBULATORY_CARE_PROVIDER_SITE_OTHER): Payer: PPO | Admitting: Adult Health

## 2018-03-20 ENCOUNTER — Encounter: Payer: Self-pay | Admitting: Adult Health

## 2018-03-20 VITALS — BP 118/76 | HR 85 | Temp 97.9°F | Ht 63.5 in | Wt 121.0 lb

## 2018-03-20 DIAGNOSIS — J4521 Mild intermittent asthma with (acute) exacerbation: Secondary | ICD-10-CM

## 2018-03-20 MED ORDER — PREDNISONE 20 MG PO TABS: ORAL_TABLET | ORAL | 0 refills | Status: DC

## 2018-03-20 MED ORDER — PROMETHAZINE-DM 6.25-15 MG/5ML PO SYRP: 5.0000 mL | ORAL_SOLUTION | Freq: Four times a day (QID) | ORAL | 1 refills | Status: DC | PRN

## 2018-03-20 MED ORDER — DOXYCYCLINE HYCLATE 100 MG PO TABS: 100.0000 mg | ORAL_TABLET | Freq: Two times a day (BID) | ORAL | 0 refills | Status: DC

## 2018-03-20 NOTE — Patient Instructions (Signed)
Start mucinex/guaifenasin for thick secretions    HOW TO TREAT VIRAL COUGH AND COLD SYMPTOMS:  -Symptoms usually last at least 1 week with the worst symptoms being around day 4.  - colds usually start with a sore throat and end with a cough, and the cough can take 2 weeks to get better.  -No antibiotics are needed for colds, flu, sore throats, cough, bronchitis UNLESS symptoms are longer than 7 days OR if you are getting better then get drastically worse.  -There are a lot of combination medications (Dayquil, Nyquil, Vicks 44, tyelnol cold and sinus, ETC). Please look at the ingredients on the back so that you are treating the correct symptoms and not doubling up on medications/ingredients.    Medicines you can use  Nasal congestion  Little Remedies saline spray (aerosol/mist)- can try this, it is in the kids section - pseudoephedrine (Sudafed)- behind the counter, do not use if you have high blood pressure, medicine that have -D in them.  - phenylephrine (Sudafed PE) -Dextormethorphan + chlorpheniramine (Coridcidin HBP)- okay if you have high blood pressure -Oxymetazoline (Afrin) nasal spray- LIMIT to 3 days -Saline nasal spray -Neti pot (used distilled or bottled water)  Ear pain/congestion  -pseudoephedrine (sudafed) - Nasonex/flonase nasal spray  Fever  -Acetaminophen (Tyelnol) -Ibuprofen (Advil, motrin, aleve)  Sore Throat  -Acetaminophen (Tyelnol) -Ibuprofen (Advil, motrin, aleve) -Drink a lot of water -Gargle with salt water - Rest your voice (don't talk) -Throat sprays -Cough drops  Body Aches  -Acetaminophen (Tyelnol) -Ibuprofen (Advil, motrin, aleve)  Headache  -Acetaminophen (Tyelnol) -Ibuprofen (Advil, motrin, aleve) - Exedrin, Exedrin Migraine  Allergy symptoms (cough, sneeze, runny nose, itchy eyes) -Claritin or loratadine cheapest but likely the weakest  -Zyrtec or certizine at night because it can make you sleepy -The strongest is allegra or  fexafinadine  Cheapest at [employer], [employer], [employer]  Cough  -Dextromethorphan (Delsym)- medicine that has DM in it -Guafenesin (Mucinex/Robitussin) - cough drops - drink lots of water  Chest Congestion  -Guafenesin (Mucinex/Robitussin)  Red Itchy Eyes  - Naphcon-A  Upset Stomach  - Bland diet (nothing spicy, greasy, fried, and high acid foods like tomatoes, oranges, berries) -OKAY- cereal, bread, soup, crackers, rice -Eat smaller more frequent meals -reduce caffeine, no alcohol -Loperamide (Imodium-AD) if diarrhea -Prevacid for heart burn  General health when sick  -Hydration -wash your hands frequently -keep surfaces clean -change pillow cases and sheets often -Get fresh air but do not exercise strenuously -Vitamin D, double up on it - Vitamin C -Zinc

## 2018-03-20 NOTE — Progress Notes (Signed)
Assessment and Plan:  Che was seen today for cough.  Diagnoses and all orders for this visit:  Mild intermittent asthmatic bronchitis with acute exacerbation Declines CXR Suggested symptomatic OTC remedies - add guaifenasin Nasal saline spray for congestion. Follow up as scheduled for follow up next week ER precautions for worsening symptoms -     promethazine-dextromethorphan (PROMETHAZINE-DM) 6.25-15 MG/5ML syrup; Take 5 mLs by mouth 4 (four) times daily as needed for cough. -     doxycycline (VIBRA-TABS) 100 MG tablet; Take 1 tablet (100 mg total) by mouth 2 (two) times daily. -     predniSONE (DELTASONE) 20 MG tablet; 1 tab 3 x day for 3 days, then 1 tab 2 x day for 3 days, then 1 tab 1 x day for 5 days  Further disposition pending results of labs. Discussed med's effects and SE's.   Over 15 minutes of exam, counseling, chart review, and critical decision making was performed.   Future Appointments  Date Time Provider [place]  03/29/2018 11:00 AM Unk Pinto, MD [facility] None  04/06/2018  8:30 AM [facility] None  04/06/2018  8:45 AM [facility] A1 [facility] None  04/06/2018  9:00 AM Ennever, Rudell Cobb, MD [facility] None  04/06/2018  9:30 AM [facility] B4 [facility] None  09/26/2018  2:00 PM Unk Pinto, MD [facility] None    ------------------------------------------------------------------------------------------------------------------   HPI BP 118/76   Pulse 85   Temp 97.9 F (36.6 C)   Ht 5' 3.5" (1.613 m)   Wt 121 lb (54.9 kg)   LMP 09/14/2014   SpO2 97%   BMI 21.10 kg/m   77 y.o.female with hx of asthma, mantle cell lymphoma, presents for evaluation for ongoing URI symptoms; she was seen 6/21 for wheezing, hoarseness, productive cough with congestion drainage - was prescribed prednisone taper, zpak and started on trellegy which improved symptoms significantly, but symptoms have been much worse since stopping.   She reports she is currently still  having productive cough (thick yellow mucus), no energy, nasal congestion, mildly wheezing. She endorses mild dyspnea, some difficulty focusing. Denies fever/chills, N/V, chest pain, palpitations  Speaks in complete sentences without difficulty  Past Medical History:  Diagnosis Date  . Adenomatous colon polyp 1994  . Asthma   . Cataract 2013   bilateral eyes  . Diverticulosis   . Family history of ischemic heart disease   . Fibrocystic breast disease   . GERD (gastroesophageal reflux disease)   . Hiatal hernia   . Hyperlipidemia   . Hypertension    pt denies - 01/16/18  . IBS (irritable bowel syndrome)   . Internal hemorrhoids   . Mantle cell lymphoma ([place]) 10/18/2014  . Melanoma ([place])    Facial, pt denies on 01/16/18  . OSA (obstructive sleep apnea)   . Osteoarthritis   . Osteoporosis   . Prediabetes   . Unspecified hypothyroidism   . Vitamin D deficiency      Allergies  Allergen Reactions  . Alendronate Other (See Comments)    "made me choke" "made me choke" chokes  . Risedronate Sodium Other (See Comments)    "made me choke"  "made me choke"   . Sulfa Antibiotics Other (See Comments)    Doesn't remember   . Sulfasalazine Other (See Comments)    Doesn't remember   . Other Rash  . Tetracyclines & Related Rash    Current Outpatient Medications on File Prior to Visit  Medication Sig  . acetaminophen (TYLENOL) 325 MG tablet Take  325 mg by mouth every 6 (six) hours as needed for moderate pain (For pain.).   [name] azithromycin (ZITHROMAX) 250 MG tablet Take 2 tablets (500 mg) on  Day 1,  followed by 1 tablet (250 mg) once daily on Days 2 through 5.  . Cholecalciferol (VITAMIN D3) 5000 units CAPS Take 10,000 Units by mouth daily.  . hyoscyamine (LEVSIN, ANASPAZ) 0.125 MG tablet Take 1 to 2 tablets every 4 hours as needed for nausea, cramping, bloating or diarrhea (Patient taking differently: Take 0.125 mg by mouth every 4 (four) hours as needed for cramping. )  .  ipratropium-albuterol (DUONEB) 0.5-2.5 (3) MG/3ML SOLN Take 3 mLs by nebulization every 4 (four) hours as needed (heaviness in chest).   [name] levothyroxine (SYNTHROID, LEVOTHROID) 50 MCG tablet TAKE 1 TABLET BY MOUTH ONCE DAILY.  [name] lidocaine-prilocaine (EMLA) cream Apply 1 application topically as needed. Apply to [facility] one hour before access.  . Multiple Vitamins-Minerals (OCUVITE PO) Take by mouth.  [name] PROAIR HFA 108 (90 Base) MCG/ACT inhaler INHALE 2 PUFFS EVERY 4 HOURS AS NEEDED INTO THE LUNGS FOR WHEEZING OR SHORTNESS OF BREATH.  . Probiotic Product (PROBIOTIC DAILY PO) Take 1 capsule by mouth daily.  [name] Respiratory Therapy Supplies (FLUTTER) DEVI Take as directed  . rosuvastatin (CRESTOR) 10 MG tablet 1 pill 2 times a week   Current Facility-Administered Medications on File Prior to Visit  Medication  . ipratropium-albuterol (DUONEB) 0.5-2.5 (3) MG/3ML nebulizer solution 3 mL    ROS: all negative except above.   Physical Exam:  BP 118/76   Pulse 85   Temp 97.9 F (36.6 C)   Ht 5' 3.5" (1.613 m)   Wt 121 lb (54.9 kg)   LMP 09/14/2014   SpO2 97%   BMI 21.10 kg/m   General Appearance: Well nourished, in no apparent distress. Eyes: PERRLA, EOMs, conjunctiva no swelling or erythema Sinuses: No Frontal/maxillary tenderness ENT/Mouth: Ext aud canals clear, TMs without erythema, bulging. No erythema, swelling, or exudate on post pharynx.  Tonsils not swollen or erythematous. Hearing normal.  Neck: Supple.  Respiratory: Respiratory effort normal, BS present throughout with rhonchi and mild inspiratory and expiratory wheezing to bilateral lower lobes, without stridor.  Cardio: RRR with no MRGs. Brisk peripheral pulses without edema.  Abdomen: Soft, + BS.  Non tender, no guarding, rebound, hernias, masses. Lymphatics: Non tender without lymphadenopathy.  Musculoskeletal: Symmtrical strength, normal gait.  Skin: Warm, dry without rashes, lesions, ecchymosis.  Psych: Awake and oriented X 3,  normal affect, Insight and Judgment appropriate.     Izora Ribas, NP 3:41 PM [facility]

## 2018-03-29 ENCOUNTER — Encounter: Payer: Self-pay | Admitting: Internal Medicine

## 2018-03-29 ENCOUNTER — Ambulatory Visit (INDEPENDENT_AMBULATORY_CARE_PROVIDER_SITE_OTHER): Payer: PPO | Admitting: Internal Medicine

## 2018-03-29 VITALS — BP 120/80 | Temp 97.5°F | Ht 63.5 in | Wt 121.8 lb

## 2018-03-29 DIAGNOSIS — E039 Hypothyroidism, unspecified: Secondary | ICD-10-CM

## 2018-03-29 DIAGNOSIS — R7303 Prediabetes: Secondary | ICD-10-CM

## 2018-03-29 DIAGNOSIS — Z79899 Other long term (current) drug therapy: Secondary | ICD-10-CM | POA: Diagnosis not present

## 2018-03-29 DIAGNOSIS — E559 Vitamin D deficiency, unspecified: Secondary | ICD-10-CM

## 2018-03-29 DIAGNOSIS — I10 Essential (primary) hypertension: Secondary | ICD-10-CM

## 2018-03-29 DIAGNOSIS — R7309 Other abnormal glucose: Secondary | ICD-10-CM

## 2018-03-29 DIAGNOSIS — E782 Mixed hyperlipidemia: Secondary | ICD-10-CM | POA: Diagnosis not present

## 2018-03-29 NOTE — Patient Instructions (Addendum)

## 2018-03-29 NOTE — Progress Notes (Signed)
This very nice 77 y.o. MWF  presents for 6 month follow up with HTN, HLD, Hypothyroidism, Hereditary tremor,  Pre-Diabetes and Vitamin D Deficiency.     Patient was dx'd with a Mantle cell Lymphoma in Jan 2016 with induced remission by Sept 2016 with Chemoradiation per Dr Marin Olp and had been on maintainance thru Nov 2018. In Apr 2016 , she had a RLE DVT tx'd w/Xarelto x 6 months. She also is followed by Dr Melvyn Novas for recurrent pulmonary infections.     Patient is has been followed expectantly for labile HTN & BP has been controlled at home. Today's BP is at goal - 120/80. Patient has had no complaints of any cardiac type chest pain, palpitations, dyspnea / orthopnea / PND, dizziness, claudication, or dependent edema.     Hyperlipidemia is not controlled with diet and she only takes her Crestor 2 x/week. Patient denies myalgias or other med SE's. Last Lipids were not at goal: Lab Results  Component Value Date   CHOL 240 (H) 12/19/2017   HDL 56 12/19/2017   LDLCALC 160 (H) 12/19/2017   TRIG 119 12/19/2017   CHOLHDL 4.3 12/19/2017      Also, the patient has history of PreDiabetes  (A1c5.9%/2012) and has had no symptoms of reactive hypoglycemia, diabetic polys, paresthesias or visual blurring.  Last A1c was Normal & at goal: Lab Results  Component Value Date   HGBA1C 5.6 09/15/2017      Further, the patient also has history of Vitamin D Deficiency and supplements vitamin D without any suspected side-effects. Last vitamin D was low (goal 70-100):    Lab Results  Component Value Date   VD25OH 49 09/15/2017   Current Outpatient Medications on File Prior to Visit  Medication Sig  . acetaminophen (TYLENOL) 325 MG tablet Take 325 mg by mouth every 6 (six) hours as needed for moderate pain (For pain.).   [name] Cholecalciferol (VITAMIN D3) 5000 units CAPS Take 10,000 Units by mouth daily.  . hyoscyamine (LEVSIN, ANASPAZ) 0.125 MG tablet Take 1 to 2 tablets every 4 hours as needed for nausea,  cramping, bloating or diarrhea (Patient taking differently: Take 0.125 mg by mouth every 4 (four) hours as needed for cramping. )  . ipratropium-albuterol (DUONEB) 0.5-2.5 (3) MG/3ML SOLN Take 3 mLs by nebulization every 4 (four) hours as needed (heaviness in chest).   [name] levothyroxine (SYNTHROID, LEVOTHROID) 50 MCG tablet TAKE 1 TABLET BY MOUTH ONCE DAILY.  [name] lidocaine-prilocaine (EMLA) cream Apply 1 application topically as needed. Apply to [facility] one hour before access.  . Multiple Vitamins-Minerals (OCUVITE PO) Take by mouth.  [name] PROAIR HFA 108 (90 Base) MCG/ACT inhaler INHALE 2 PUFFS EVERY 4 HOURS AS NEEDED INTO THE LUNGS FOR WHEEZING OR SHORTNESS OF BREATH.  . Probiotic Product (PROBIOTIC DAILY PO) Take 1 capsule by mouth daily.  [name] Respiratory Therapy Supplies (FLUTTER) DEVI Take as directed  . rosuvastatin (CRESTOR) 10 MG tablet 1 pill 2 times a week   Current Facility-Administered Medications on File Prior to Visit  Medication  . ipratropium-albuterol (DUONEB) 0.5-2.5 (3) MG/3ML nebulizer solution 3 mL   Allergies  Allergen Reactions  . Alendronate Other (See Comments)    "made me choke" "made me choke" chokes  . Risedronate Sodium Other (See Comments)    "made me choke"  "made me choke"   . Sulfa Antibiotics Other (See Comments)    Doesn't remember   . Sulfasalazine Other (See Comments)    Doesn't remember   .  Other Rash  . Tetracyclines & Related Rash   PMHx:   Past Medical History:  Diagnosis Date  . Adenomatous colon polyp 1994  . Asthma   . Cataract 2013   bilateral eyes  . Diverticulosis   . Family history of ischemic heart disease   . Fibrocystic breast disease   . GERD (gastroesophageal reflux disease)   . Hiatal hernia   . Hyperlipidemia   . Hypertension    pt denies - 01/16/18  . IBS (irritable bowel syndrome)   . Internal hemorrhoids   . Mantle cell lymphoma ([place]) 10/18/2014  . Melanoma ([place])    Facial, pt denies on 01/16/18  . OSA (obstructive sleep apnea)     . Osteoarthritis   . Osteoporosis   . Prediabetes   . Unspecified hypothyroidism   . Vitamin D deficiency    Immunization History  Administered Date(s) Administered  . Pneumococcal Conjugate-13 03/18/2016  . Pneumococcal Polysaccharide-23 06/18/2013  . Zoster 04/17/2013   Past Surgical History:  Procedure Laterality Date  . APPENDECTOMY    . BUNIONECTOMY    . CATARACT EXTRACTION, BILATERAL    . KNEE ARTHROSCOPY    . TONSILLECTOMY    . UMBILICAL HERNIA REPAIR    . VIDEO BRONCHOSCOPY Bilateral 10/12/2017   Procedure: VIDEO BRONCHOSCOPY WITHOUT FLUORO;  Surgeon: Tanda Rockers, MD;  Location: [facility];  Service: Endoscopy;  Laterality: Bilateral;   FHx:    Reviewed / unchanged  SHx:    Reviewed / unchanged   Systems Review:  Constitutional: Denies fever, chills, wt changes, headaches, insomnia, fatigue, night sweats, change in appetite. Eyes: Denies redness, blurred vision, diplopia, discharge, itchy, watery eyes.  ENT: Denies discharge, congestion, post nasal drip, epistaxis, sore throat, earache, hearing loss, dental pain, tinnitus, vertigo, sinus pain, snoring.  CV: Denies chest pain, palpitations, irregular heartbeat, syncope, dyspnea, diaphoresis, orthopnea, PND, claudication or edema. Respiratory: denies cough, dyspnea, DOE, pleurisy, hoarseness, laryngitis, wheezing.  Gastrointestinal: Denies dysphagia, odynophagia, heartburn, reflux, water brash, abdominal pain or cramps, nausea, vomiting, bloating, diarrhea, constipation, hematemesis, melena, hematochezia  or hemorrhoids. Genitourinary: Denies dysuria, frequency, urgency, nocturia, hesitancy, discharge, hematuria or flank pain. Musculoskeletal: Denies arthralgias, myalgias, stiffness, jt. swelling, pain, limping or strain/sprain.  Skin: Denies pruritus, rash, hives, warts, acne, eczema or change in skin lesion(s). Neuro: No weakness, tremor, incoordination, spasms, paresthesia or pain. Psychiatric: Denies confusion,  memory loss or sensory loss. Endo: Denies change in weight, skin or hair change.  Heme/Lymph: No excessive bleeding, bruising or enlarged lymph nodes.  Physical Exam  BP 120/80   Temp (!) 97.5 F (36.4 C)   Ht 5' 3.5" (1.613 m)   Wt 121 lb 12.8 oz (55.2 kg)   LMP 09/14/2014   SpO2 99%   BMI 21.24 kg/m   Appears  well nourished, well groomed  and in no distress.  Eyes: PERRLA, EOMs, conjunctiva no swelling or erythema. Sinuses: No frontal/maxillary tenderness ENT/Mouth: EAC's clear, TM's nl w/o erythema, bulging. Nares clear w/o erythema, swelling, exudates. Oropharynx clear without erythema or exudates. Oral hygiene is good. Tongue normal, non obstructing. Hearing intact.  Neck: Supple. Thyroid not palpable. Car 2+/2+ without bruits, nodes or JVD. Chest: Respirations nl with BS clear & equal w/o rales, rhonchi, wheezing or stridor.  Cor: Heart sounds normal w/ regular rate and rhythm without sig. murmurs, gallops, clicks or rubs. Peripheral pulses normal and equal  without edema.  Abdomen: Soft & bowel sounds normal. Non-tender w/o guarding, rebound, hernias, masses or organomegaly.  Lymphatics: Unremarkable.  Musculoskeletal: Full ROM all peripheral extremities, joint stability, 5/5 strength and normal gait.  Skin: Warm, dry without exposed rashes, lesions or ecchymosis apparent.  Neuro: Cranial nerves intact, reflexes equal bilaterally. Sensory-motor testing grossly intact. Tendon reflexes grossly intact.  Pysch: Alert & oriented x 3.  Insight and judgement nl & appropriate. No ideations.  Assessment and Plan:  1. Essential hypertension  - Continue medication, monitor blood pressure at home.  - Continue DASH diet.  Reminder to go to the ER if any CP,  SOB, nausea, dizziness, severe HA, changes vision/speech.  - CBC with Differential/Platelet - COMPLETE METABOLIC PANEL WITH GFR - Magnesium - TSH  2. Hyperlipidemia, mixed  - Continue diet, exercise,& lifestyle  modifications.  - Advised to change her Crestor dosing to 1/2 tab qod or 1/4 tab qd - Continue monitor periodic cholesterol/liver & renal functions   - Lipid panel - TSH  3. Abnormal glucose  - Continue diet, exercise, lifestyle modifications.  - Monitor appropriate labs.  - Hemoglobin A1c - Insulin, random  4. Vitamin D deficiency  - Continue supplementation.  - VITAMIN D 25 Hydroxyl  5. Prediabetes  - Hemoglobin A1c - Insulin, random  6. Hypothyroidism,  - TSH  7. Medication management  - CBC with Differential/Platelet - COMPLETE METABOLIC PANEL WITH GFR - Magnesium - Lipid panel - TSH - Hemoglobin A1c - Insulin, random - VITAMIN D 25 Hydroxyl       Discussed  regular exercise, BP monitoring, weight control to achieve/maintain BMI less than 25 and discussed med and SE's. Recommended labs to assess and monitor clinical status with further disposition pending results of labs. Over 30 minutes of exam, counseling, chart review was performed.

## 2018-03-30 LAB — CBC WITH DIFFERENTIAL/PLATELET
BASOS ABS: 46 {cells}/uL (ref 0–200)
Basophils Relative: 0.4 %
EOS ABS: 708 {cells}/uL — AB (ref 15–500)
Eosinophils Relative: 6.1 %
HCT: 42.3 % (ref 35.0–45.0)
Hemoglobin: 14.4 g/dL (ref 11.7–15.5)
Lymphs Abs: 1183 cells/uL (ref 850–3900)
MCH: 29.8 pg (ref 27.0–33.0)
MCHC: 34 g/dL (ref 32.0–36.0)
MCV: 87.6 fL (ref 80.0–100.0)
MONOS PCT: 9.9 %
MPV: 9.4 fL (ref 7.5–12.5)
NEUTROS ABS: 8514 {cells}/uL — AB (ref 1500–7800)
NEUTROS PCT: 73.4 %
Platelets: 292 10*3/uL (ref 140–400)
RBC: 4.83 10*6/uL (ref 3.80–5.10)
RDW: 12.6 % (ref 11.0–15.0)
TOTAL LYMPHOCYTE: 10.2 %
WBC mixed population: 1148 cells/uL — ABNORMAL HIGH (ref 200–950)
WBC: 11.6 10*3/uL — ABNORMAL HIGH (ref 3.8–10.8)

## 2018-03-30 LAB — COMPLETE METABOLIC PANEL WITH GFR
AG RATIO: 1.8 (calc) (ref 1.0–2.5)
ALT: 15 U/L (ref 6–29)
AST: 18 U/L (ref 10–35)
Albumin: 3.9 g/dL (ref 3.6–5.1)
Alkaline phosphatase (APISO): 61 U/L (ref 33–130)
BUN/Creatinine Ratio: 18 (calc) (ref 6–22)
BUN: 18 mg/dL (ref 7–25)
CALCIUM: 9.4 mg/dL (ref 8.6–10.4)
CO2: 29 mmol/L (ref 20–32)
CREATININE: 0.98 mg/dL — AB (ref 0.60–0.93)
Chloride: 95 mmol/L — ABNORMAL LOW (ref 98–110)
GFR, EST AFRICAN AMERICAN: 66 mL/min/{1.73_m2} (ref >=60)
GFR, EST NON AFRICAN AMERICAN: 57 mL/min/{1.73_m2} — AB (ref >=60)
GLOBULIN: 2.2 g/dL (ref 1.9–3.7)
Glucose, Bld: 69 mg/dL (ref 65–99)
POTASSIUM: 3.9 mmol/L (ref 3.5–5.3)
SODIUM: 133 mmol/L — AB (ref 135–146)
TOTAL PROTEIN: 6.1 g/dL (ref 6.1–8.1)
Total Bilirubin: 0.6 mg/dL (ref 0.2–1.2)

## 2018-03-30 LAB — INSULIN, RANDOM: Insulin: 4.4 u[IU]/mL (ref 2.0–19.6)

## 2018-03-30 LAB — LIPID PANEL
CHOL/HDL RATIO: 3.4 (calc) (ref <5.0)
CHOLESTEROL: 241 mg/dL — AB (ref <200)
HDL: 70 mg/dL (ref >50)
LDL CHOLESTEROL (CALC): 142 mg/dL — AB
NON-HDL CHOLESTEROL (CALC): 171 mg/dL — AB (ref <130)
Triglycerides: 156 mg/dL — ABNORMAL HIGH (ref <150)

## 2018-03-30 LAB — HEMOGLOBIN A1C
HEMOGLOBIN A1C: 5.8 %{Hb} — AB (ref <5.7)
Mean Plasma Glucose: 120 (calc)
eAG (mmol/L): 6.6 (calc)

## 2018-03-30 LAB — MAGNESIUM: Magnesium: 2 mg/dL (ref 1.5–2.5)

## 2018-03-30 LAB — TSH: TSH: 2.73 mIU/L (ref 0.40–4.50)

## 2018-03-30 LAB — VITAMIN D 25 HYDROXY (VIT D DEFICIENCY, FRACTURES): VIT D 25 HYDROXY: 55 ng/mL (ref 30–100)

## 2018-03-31 DIAGNOSIS — H1132 Conjunctival hemorrhage, left eye: Secondary | ICD-10-CM | POA: Diagnosis not present

## 2018-04-06 ENCOUNTER — Other Ambulatory Visit: Payer: Self-pay

## 2018-04-06 ENCOUNTER — Inpatient Hospital Stay: Payer: PPO

## 2018-04-06 ENCOUNTER — Ambulatory Visit: Payer: PPO

## 2018-04-06 ENCOUNTER — Inpatient Hospital Stay: Payer: PPO | Attending: Hematology & Oncology | Admitting: Hematology & Oncology

## 2018-04-06 VITALS — BP 117/79 | HR 90 | Temp 98.5°F | Resp 20 | Wt 119.5 lb

## 2018-04-06 DIAGNOSIS — R5383 Other fatigue: Secondary | ICD-10-CM | POA: Insufficient documentation

## 2018-04-06 DIAGNOSIS — Z9225 Personal history of immunosupression therapy: Secondary | ICD-10-CM | POA: Diagnosis not present

## 2018-04-06 DIAGNOSIS — Z79899 Other long term (current) drug therapy: Secondary | ICD-10-CM | POA: Insufficient documentation

## 2018-04-06 DIAGNOSIS — Z9221 Personal history of antineoplastic chemotherapy: Secondary | ICD-10-CM | POA: Insufficient documentation

## 2018-04-06 DIAGNOSIS — R05 Cough: Secondary | ICD-10-CM | POA: Insufficient documentation

## 2018-04-06 DIAGNOSIS — R0602 Shortness of breath: Secondary | ICD-10-CM | POA: Diagnosis not present

## 2018-04-06 DIAGNOSIS — D801 Nonfamilial hypogammaglobulinemia: Secondary | ICD-10-CM | POA: Diagnosis not present

## 2018-04-06 DIAGNOSIS — C8312 Mantle cell lymphoma, intrathoracic lymph nodes: Secondary | ICD-10-CM

## 2018-04-06 DIAGNOSIS — R5381 Other malaise: Secondary | ICD-10-CM | POA: Diagnosis not present

## 2018-04-06 DIAGNOSIS — R062 Wheezing: Secondary | ICD-10-CM

## 2018-04-06 DIAGNOSIS — C8311 Mantle cell lymphoma, lymph nodes of head, face, and neck: Secondary | ICD-10-CM

## 2018-04-06 DIAGNOSIS — C8310 Mantle cell lymphoma, unspecified site: Secondary | ICD-10-CM | POA: Diagnosis not present

## 2018-04-06 DIAGNOSIS — D500 Iron deficiency anemia secondary to blood loss (chronic): Secondary | ICD-10-CM

## 2018-04-06 LAB — CMP (CANCER CENTER ONLY)
ALT: 24 U/L (ref 10–47)
ANION GAP: 5 (ref 5–15)
AST: 30 U/L (ref 11–38)
Albumin: 3.5 g/dL (ref 3.5–5.0)
Alkaline Phosphatase: 53 U/L (ref 26–84)
BUN: 13 mg/dL (ref 7–22)
CO2: 30 mmol/L (ref 18–33)
Calcium: 9.9 mg/dL (ref 8.0–10.3)
Chloride: 102 mmol/L (ref 98–108)
Creatinine: 0.8 mg/dL (ref 0.60–1.20)
Glucose, Bld: 116 mg/dL (ref 73–118)
POTASSIUM: 3.8 mmol/L (ref 3.3–4.7)
SODIUM: 137 mmol/L (ref 128–145)
Total Bilirubin: 0.8 mg/dL (ref 0.2–1.6)
Total Protein: 6.2 g/dL — ABNORMAL LOW (ref 6.4–8.1)

## 2018-04-06 LAB — CBC WITH DIFFERENTIAL (CANCER CENTER ONLY)
BASOS ABS: 0 10*3/uL (ref 0.0–0.1)
Basophils Relative: 1 %
EOS PCT: 5 %
Eosinophils Absolute: 0.3 10*3/uL (ref 0.0–0.5)
HEMATOCRIT: 43.1 % (ref 34.8–46.6)
HEMOGLOBIN: 14.5 g/dL (ref 11.6–15.9)
LYMPHS ABS: 0.7 10*3/uL — AB (ref 0.9–3.3)
LYMPHS PCT: 11 %
MCH: 29.8 pg (ref 26.0–34.0)
MCHC: 33.6 g/dL (ref 32.0–36.0)
MCV: 88.5 fL (ref 81.0–101.0)
Monocytes Absolute: 0.9 10*3/uL (ref 0.1–0.9)
Monocytes Relative: 14 %
NEUTROS ABS: 4.7 10*3/uL (ref 1.5–6.5)
Neutrophils Relative %: 69 %
Platelet Count: 212 10*3/uL (ref 145–400)
RBC: 4.87 MIL/uL (ref 3.70–5.32)
RDW: 14 % (ref 11.1–15.7)
WBC: 6.6 10*3/uL (ref 3.9–10.0)

## 2018-04-06 LAB — IRON AND TIBC
Iron: 74 ug/dL (ref 41–142)
Saturation Ratios: 25 % (ref 21–57)
TIBC: 295 ug/dL (ref 236–444)
UIBC: 221 ug/dL

## 2018-04-06 LAB — FERRITIN: Ferritin: 49 ng/mL (ref 11–307)

## 2018-04-06 MED ORDER — SODIUM CHLORIDE 0.9% FLUSH
10.0000 mL | INTRAVENOUS | Status: DC | PRN
  Administered 2018-04-06: 10 mL via INTRAVENOUS
  Filled 2018-04-06: qty 10

## 2018-04-06 MED ORDER — HEPARIN SOD (PORK) LOCK FLUSH 100 UNIT/ML IV SOLN
500.0000 [IU] | Freq: Once | INTRAVENOUS | Status: AC
  Administered 2018-04-06: 500 [IU] via INTRAVENOUS
  Filled 2018-04-06: qty 5

## 2018-04-06 NOTE — Progress Notes (Signed)
Hematology and Oncology Follow Up Visit  Shannon Parsons [id number] 03/25/1941 77 y.o. 04/06/2018   Principle Diagnosis:   Mantle cell lymphoma (MCIPI = 5)  Thromboembolic event of the right leg  Acquired Hypogammaglobulinemia  Bronchiectasis  Current Therapy:    Rituxan - maintanence - s/p cycle #10 - to finish in 07/2017  IVIG - q  month -restart on 10/27/2017; last dose 01/05/2018     Interim History:  Ms.  Shannon Parsons is back for follow-up.  She is feeling all that well.  She has a cough.  The cough is really not productive.  She is had the cough for a month or so.  I do not think she is seeing the pulmonologist.  She does not want any IVIG today.  We had her on IVIG in the past.  Her last dose was back in April 2019.  This did seem to help with the bronchiectasis.  Given her diagnosis of mantle cell lymphoma, I do think that we are going to have to do a PET scan on her.  She is had risk for recurrence.  It is been about 8 months since she completed her maintenance Rituxan.  She had an IgG level of 900 mg/dL back in April.  She is had no rashes.  Is been no leg swelling.  She has lost a little weight.  Overall, her performance status is ECOG 1.   Medications:  Current Outpatient Medications:  .  acetaminophen (TYLENOL) 325 MG tablet, Take 325 mg by mouth every 6 (six) hours as needed for moderate pain (For pain.). , Disp: , Rfl:  .  Cholecalciferol (VITAMIN D3) 5000 units CAPS, Take 10,000 Units by mouth daily., Disp: , Rfl:  .  hyoscyamine (LEVSIN, ANASPAZ) 0.125 MG tablet, Take 1 to 2 tablets every 4 hours as needed for nausea, cramping, bloating or diarrhea (Patient taking differently: Take 0.125 mg by mouth every 4 (four) hours as needed for cramping. ), Disp: 90 tablet, Rfl: 11 .  ipratropium-albuterol (DUONEB) 0.5-2.5 (3) MG/3ML SOLN, Take 3 mLs by nebulization every 4 (four) hours as needed (heaviness in chest). , Disp: , Rfl:  .  levothyroxine (SYNTHROID,  LEVOTHROID) 50 MCG tablet, TAKE 1 TABLET BY MOUTH ONCE DAILY., Disp: 90 tablet, Rfl: 4 .  lidocaine-prilocaine (EMLA) cream, Apply 1 application topically as needed. Apply to [facility] one hour before access., Disp: 30 g, Rfl: 3 .  Multiple Vitamins-Minerals (OCUVITE PO), Take by mouth., Disp: , Rfl:  .  PROAIR HFA 108 (90 Base) MCG/ACT inhaler, INHALE 2 PUFFS EVERY 4 HOURS AS NEEDED INTO THE LUNGS FOR WHEEZING OR SHORTNESS OF BREATH., Disp: 48 g, Rfl: 1 .  Probiotic Product (PROBIOTIC DAILY PO), Take 1 capsule by mouth daily., Disp: , Rfl:  .  Respiratory Therapy Supplies (FLUTTER) DEVI, Take as directed, Disp: 1 each, Rfl: 0 .  rosuvastatin (CRESTOR) 10 MG tablet, 1 pill 2 times a week, Disp: 30 tablet, Rfl: 3 No current facility-administered medications for this visit.   Facility-Administered Medications Ordered in Other Visits:  .  ipratropium-albuterol (DUONEB) 0.5-2.5 (3) MG/3ML nebulizer solution 3 mL, 3 mL, Nebulization, Q6H, [place], Holli Humbles, NP  Allergies:  Allergies  Allergen Reactions  . Alendronate Other (See Comments)    "made me choke" "made me choke" chokes  . Risedronate Sodium Other (See Comments)    "made me choke"  "made me choke"   . Sulfa Antibiotics Other (See Comments)    Doesn't remember   . Sulfasalazine Other (See  Comments)    Doesn't remember   . Other Rash  . Tetracyclines & Related Rash    Past Medical History, Surgical history, Social history, and Family History were reviewed and updated.  Review of Systems: Review of Systems  Constitutional: Positive for malaise/fatigue.  HENT: Positive for congestion.   Eyes: Negative.   Respiratory: Positive for shortness of breath and wheezing.   Cardiovascular: Negative.   Gastrointestinal: Negative.   Genitourinary: Negative.   Musculoskeletal: Negative.   Skin: Negative.   Neurological: Negative.   Endo/Heme/Allergies: Negative.   Psychiatric/Behavioral: Negative.     Physical Exam:  weight is 119 lb  8 oz (54.2 kg). Her oral temperature is 98.5 F (36.9 C). Her blood pressure is 117/79 and her pulse is 90. Her respiration is 20 and oxygen saturation is 100%.   Physical Exam  Constitutional: She is oriented to person, place, and time.  HENT:  Head: Normocephalic and atraumatic.  Mouth/Throat: Oropharynx is clear and moist.  Eyes: Pupils are equal, round, and reactive to light. EOM are normal.  Neck: Normal range of motion.  Cardiovascular: Normal rate, regular rhythm and normal heart sounds.  Pulmonary/Chest: Effort normal and breath sounds normal.  Abdominal: Soft. Bowel sounds are normal.  Musculoskeletal: Normal range of motion. She exhibits no edema, tenderness or deformity.  Lymphadenopathy:    She has no cervical adenopathy.  Neurological: She is alert and oriented to person, place, and time.  Skin: Skin is warm and dry. No rash noted. No erythema.  Psychiatric: She has a normal mood and affect. Her behavior is normal. Judgment and thought content normal.  Vitals reviewed.    Lab Results  Component Value Date   WBC 6.6 04/06/2018   HGB 14.5 04/06/2018   HCT 43.1 04/06/2018   MCV 88.5 04/06/2018   PLT 212 04/06/2018     Chemistry      Component Value Date/Time   NA 137 04/06/2018 0830   NA 141 07/13/2017 0743   NA 138 08/01/2015 1124   K 3.8 04/06/2018 0830   K 4.2 07/13/2017 0743   K 4.0 08/01/2015 1124   CL 102 04/06/2018 0830   CL 105 07/13/2017 0743   CO2 30 04/06/2018 0830   CO2 27 07/13/2017 0743   CO2 28 08/01/2015 1124   BUN 13 04/06/2018 0830   BUN 16 07/13/2017 0743   BUN 10.4 08/01/2015 1124   CREATININE 0.80 04/06/2018 0830   CREATININE 0.98 (H) 03/29/2018 1048   CREATININE 0.8 08/01/2015 1124      Component Value Date/Time   CALCIUM 9.9 04/06/2018 0830   CALCIUM 9.5 07/13/2017 0743   CALCIUM 9.6 08/01/2015 1124   ALKPHOS 53 04/06/2018 0830   ALKPHOS 68 07/13/2017 0743   ALKPHOS 88 08/01/2015 1124   AST 30 04/06/2018 0830   AST 23  08/01/2015 1124   ALT 24 04/06/2018 0830   ALT 19 07/13/2017 0743   ALT 12 08/01/2015 1124   BILITOT 0.8 04/06/2018 0830   BILITOT <0.30 08/01/2015 1124      Impression and Plan: Ms. Digangi is 77 year old white female. She has mantle cell lymphoma. Again, by the [facility] she is an intermediate risk.  She received 2 different courses of chemotherapy. She could not tolerate R-CHOP with Velcade substituting for vincristine. This however was incredibly helpful. We finished up with Rituxan/bendamustine. She completed this in August 2016.  We then started maintenance Rituxan. She started this in November 2016.  She completed this in November 2018.  I really do not think that she has recurrent lymphoma but yet I do think we have to check.  I would like to see her back in about a month or so just that we can have close contact with her and try to help her out.  I did recommend that she try to see her pulmonologist again.   Volanda Napoleon, MD 7/25/20191:45 PM

## 2018-04-07 LAB — PROTEIN ELECTROPHORESIS, SERUM, WITH REFLEX
A/G Ratio: 1.3 (ref 0.7–1.7)
Albumin ELP: 3.2 g/dL (ref 2.9–4.4)
Alpha-1-Globulin: 0.2 g/dL (ref 0.0–0.4)
Alpha-2-Globulin: 0.7 g/dL (ref 0.4–1.0)
Beta Globulin: 0.9 g/dL (ref 0.7–1.3)
Gamma Globulin: 0.6 g/dL (ref 0.4–1.8)
Globulin, Total: 2.5 g/dL (ref 2.2–3.9)
Total Protein ELP: 5.7 g/dL — ABNORMAL LOW (ref 6.0–8.5)

## 2018-04-07 LAB — BETA 2 MICROGLOBULIN, SERUM: Beta-2 Microglobulin: 2.1 mg/L (ref 0.6–2.4)

## 2018-04-07 LAB — IGG, IGA, IGM
IGG (IMMUNOGLOBIN G), SERUM: 625 mg/dL — AB (ref 700–1600)
IGM (IMMUNOGLOBULIN M), SRM: 8 mg/dL — AB (ref 26–217)
IgA: 32 mg/dL — ABNORMAL LOW (ref 64–422)

## 2018-04-07 LAB — KAPPA/LAMBDA LIGHT CHAINS
Kappa free light chain: 7.7 mg/L (ref 3.3–19.4)
Kappa, lambda light chain ratio: 1.38 (ref 0.26–1.65)
Lambda free light chains: 5.6 mg/L — ABNORMAL LOW (ref 5.7–26.3)

## 2018-04-12 ENCOUNTER — Telehealth: Payer: Self-pay

## 2018-04-12 NOTE — Telephone Encounter (Signed)
Pt forgot that I spoke with her on May 5th 2019 about her heart monitor results. I went back over the results with her and she voiced understanding & this time stated that she would write this down. I let pt know that it was ok & she could call any time she needs to. July 31st 2019 by DD

## 2018-04-17 ENCOUNTER — Other Ambulatory Visit: Payer: Self-pay | Admitting: *Deleted

## 2018-04-17 NOTE — Patient Outreach (Signed)
[facility]) Care Management  04/17/2018  Shannon Parsons 1941/09/11 [id number]  Referral via Nurse call center; patient states white of eye is red and thinks blood vessel burst in eye. Patient was advised to see her MD within 3 days.  Telephone call to patient who was advised of reason for call. HIPPA verification received.   Patient voices that she saw her opthalmolologist within 3 days and everything was fine. States BP was fine. Redness has resolved.  States no further health concerns.  Plan: Case closure.  [name], RN BSN [place] Management Coordinator [facility] Care Management  [phone]

## 2018-04-20 ENCOUNTER — Ambulatory Visit (HOSPITAL_COMMUNITY)
Admission: RE | Admit: 2018-04-20 | Discharge: 2018-04-20 | Disposition: A | Discharge status: Home / Self Care | Payer: PPO | Source: Ambulatory Visit | Attending: Hematology & Oncology | Admitting: Hematology & Oncology

## 2018-04-20 DIAGNOSIS — K5730 Diverticulosis of large intestine without perforation or abscess without bleeding: Secondary | ICD-10-CM | POA: Diagnosis not present

## 2018-04-20 DIAGNOSIS — I2510 Atherosclerotic heart disease of native coronary artery without angina pectoris: Secondary | ICD-10-CM | POA: Insufficient documentation

## 2018-04-20 DIAGNOSIS — I700 Atherosclerosis of aorta: Secondary | ICD-10-CM | POA: Diagnosis not present

## 2018-04-20 DIAGNOSIS — J320 Chronic maxillary sinusitis: Secondary | ICD-10-CM | POA: Insufficient documentation

## 2018-04-20 DIAGNOSIS — C8310 Mantle cell lymphoma, unspecified site: Secondary | ICD-10-CM | POA: Diagnosis not present

## 2018-04-20 LAB — GLUCOSE, CAPILLARY: Glucose-Capillary: 89 mg/dL (ref 70–99)

## 2018-04-20 MED ORDER — FLUDEOXYGLUCOSE F - 18 (FDG) INJECTION
6.3000 | Freq: Once | INTRAVENOUS | Status: AC | PRN
  Administered 2018-04-20: 6.3 via INTRAVENOUS

## 2018-04-21 ENCOUNTER — Telehealth: Payer: Self-pay

## 2018-04-21 NOTE — Telephone Encounter (Addendum)
Attached message given to patient via phone. Pt verbalizes understanding and appreciation. Appts for 8/29 given to pt. dph  ----- Message from Volanda Napoleon, MD sent at 04/21/2018  9:02 AM EDT ----- Call - NO lymphoma recurrence!!!  Laurey Arrow

## 2018-04-25 ENCOUNTER — Telehealth: Payer: Self-pay | Admitting: *Deleted

## 2018-04-25 ENCOUNTER — Other Ambulatory Visit: Payer: Self-pay | Admitting: *Deleted

## 2018-04-25 DIAGNOSIS — C8313 Mantle cell lymphoma, intra-abdominal lymph nodes: Secondary | ICD-10-CM

## 2018-04-25 DIAGNOSIS — L502 Urticaria due to cold and heat: Secondary | ICD-10-CM

## 2018-04-25 MED ORDER — AMOXICILLIN 500 MG PO CAPS: ORAL_CAPSULE | ORAL | 0 refills | Status: DC

## 2018-04-25 NOTE — Telephone Encounter (Signed)
Message received from patient requesting a refill of Amoxicillin for dental appt tomorrow to be sent to [facility].  Call placed back to patient to inform her that refill for Amoxicillin has been sent to [facility]. Patient appreciative of call back and has no further questions or concerns at this time.

## 2018-05-11 ENCOUNTER — Encounter: Payer: Self-pay | Admitting: Hematology & Oncology

## 2018-05-11 ENCOUNTER — Inpatient Hospital Stay: Payer: PPO | Attending: Hematology & Oncology | Admitting: Hematology & Oncology

## 2018-05-11 ENCOUNTER — Inpatient Hospital Stay: Payer: PPO

## 2018-05-11 ENCOUNTER — Other Ambulatory Visit: Payer: Self-pay

## 2018-05-11 VITALS — BP 132/76 | Wt 120.0 lb

## 2018-05-11 DIAGNOSIS — J479 Bronchiectasis, uncomplicated: Secondary | ICD-10-CM | POA: Insufficient documentation

## 2018-05-11 DIAGNOSIS — Z79899 Other long term (current) drug therapy: Secondary | ICD-10-CM | POA: Insufficient documentation

## 2018-05-11 DIAGNOSIS — D801 Nonfamilial hypogammaglobulinemia: Secondary | ICD-10-CM | POA: Insufficient documentation

## 2018-05-11 DIAGNOSIS — Z9225 Personal history of immunosupression therapy: Secondary | ICD-10-CM | POA: Diagnosis not present

## 2018-05-11 DIAGNOSIS — C8310 Mantle cell lymphoma, unspecified site: Secondary | ICD-10-CM

## 2018-05-11 DIAGNOSIS — Z95828 Presence of other vascular implants and grafts: Secondary | ICD-10-CM

## 2018-05-11 DIAGNOSIS — R5383 Other fatigue: Secondary | ICD-10-CM | POA: Insufficient documentation

## 2018-05-11 DIAGNOSIS — R5381 Other malaise: Secondary | ICD-10-CM | POA: Diagnosis not present

## 2018-05-11 LAB — CMP (CANCER CENTER ONLY)
ALBUMIN: 4.1 g/dL (ref 3.5–5.0)
ALT: 22 U/L (ref 10–47)
AST: 34 U/L (ref 11–38)
Alkaline Phosphatase: 60 U/L (ref 26–84)
Anion gap: 6 (ref 5–15)
BUN: 9 mg/dL (ref 7–22)
CALCIUM: 9.7 mg/dL (ref 8.0–10.3)
CO2: 29 mmol/L (ref 18–33)
CREATININE: 0.9 mg/dL (ref 0.60–1.20)
Chloride: 100 mmol/L (ref 98–108)
Glucose, Bld: 94 mg/dL (ref 73–118)
Potassium: 4.2 mmol/L (ref 3.3–4.7)
SODIUM: 135 mmol/L (ref 128–145)
Total Bilirubin: 0.8 mg/dL (ref 0.2–1.6)
Total Protein: 6.3 g/dL — ABNORMAL LOW (ref 6.4–8.1)

## 2018-05-11 LAB — CBC WITH DIFFERENTIAL/PLATELET
BASOS ABS: 0.1 10*3/uL (ref 0.0–0.1)
Basophils Relative: 1 %
EOS ABS: 0.5 10*3/uL (ref 0.0–0.7)
EOS PCT: 9 %
HCT: 41.9 % (ref 36.0–46.0)
Hemoglobin: 14 g/dL (ref 12.0–15.0)
LYMPHS ABS: 0.9 10*3/uL (ref 0.7–4.0)
Lymphocytes Relative: 16 %
MCH: 29.4 pg (ref 26.0–34.0)
MCHC: 33.4 g/dL (ref 30.0–36.0)
MCV: 87.8 fL (ref 78.0–100.0)
Monocytes Absolute: 0.9 10*3/uL (ref 0.1–1.0)
Monocytes Relative: 16 %
Neutro Abs: 3.2 10*3/uL (ref 1.7–7.7)
Neutrophils Relative %: 58 %
PLATELETS: 223 10*3/uL (ref 150–400)
RBC: 4.77 MIL/uL (ref 3.87–5.11)
RDW: 13.3 % (ref 11.5–15.5)
WBC: 5.5 10*3/uL (ref 4.0–10.5)

## 2018-05-11 LAB — LACTATE DEHYDROGENASE: LDH: 204 U/L — ABNORMAL HIGH (ref 98–192)

## 2018-05-11 MED ORDER — HEPARIN SOD (PORK) LOCK FLUSH 100 UNIT/ML IV SOLN
500.0000 [IU] | Freq: Once | INTRAVENOUS | Status: AC
  Administered 2018-05-11: 500 [IU] via INTRAVENOUS
  Filled 2018-05-11: qty 5

## 2018-05-11 MED ORDER — SODIUM CHLORIDE 0.9% FLUSH
10.0000 mL | INTRAVENOUS | Status: DC | PRN
  Administered 2018-05-11: 10 mL via INTRAVENOUS
  Filled 2018-05-11: qty 10

## 2018-05-11 NOTE — Progress Notes (Signed)
Hematology and Oncology Follow Up Visit  Shannon Parsons [id number] Nov 09, 1940 77 y.o. 05/11/2018   Principle Diagnosis:   Mantle cell lymphoma (MCIPI = 5)  Thromboembolic event of the right leg  Acquired Hypogammaglobulinemia  Bronchiectasis  Current Therapy:    Rituxan - maintanence - s/p cycle #10 - to finish in 07/2017  IVIG - q  month -restart on 10/27/2017; last dose 01/05/2018     Interim History:  [name] is back for follow-up.  So far, she is doing better.  We did go ahead and get a PET scan on her.  I get the PET scan because I felt that she is at a higher risk for recurrent mantle cell lymphoma.  Thankfully, the PET scan, which was done on August 9, did not show any evidence of recurrent mantle cell lymphoma.  She is feeling better.  Her breathing is doing better.  She is not coughing as much..  She is had no problems with bowels or bladder.  She is eating well.  She is had no rashes.  There is been no leg swelling.  She has had no mouth sores.  There is been no headache.  Hopefully, she and her husband will have a nice Labor Day weekend.  Her husband will be undergoing interventional vascular surgery.  This will happen I think in September.  Overall, her performance status is ECOG 1.   Medications:  Current Outpatient Medications:  .  acetaminophen (TYLENOL) 325 MG tablet, Take 325 mg by mouth every 6 (six) hours as needed for moderate pain (For pain.). , Disp: , Rfl:  .  amoxicillin (AMOXIL) 500 MG capsule, Take 4 pills one hour before dental procedure., Disp: 12 capsule, Rfl: 0 .  Cholecalciferol (VITAMIN D3) 5000 units CAPS, Take 10,000 Units by mouth daily., Disp: , Rfl:  .  hyoscyamine (LEVSIN, ANASPAZ) 0.125 MG tablet, Take 1 to 2 tablets every 4 hours as needed for nausea, cramping, bloating or diarrhea (Patient taking differently: Take 0.125 mg by mouth every 4 (four) hours as needed for cramping. ), Disp: 90 tablet, Rfl: 11 .  ipratropium-albuterol  (DUONEB) 0.5-2.5 (3) MG/3ML SOLN, Take 3 mLs by nebulization every 4 (four) hours as needed (heaviness in chest). , Disp: , Rfl:  .  levothyroxine (SYNTHROID, LEVOTHROID) 50 MCG tablet, TAKE 1 TABLET BY MOUTH ONCE DAILY., Disp: 90 tablet, Rfl: 4 .  lidocaine-prilocaine (EMLA) cream, Apply 1 application topically as needed. Apply to [facility] one hour before access., Disp: 30 g, Rfl: 3 .  Multiple Vitamins-Minerals (OCUVITE PO), Take by mouth., Disp: , Rfl:  .  PROAIR HFA 108 (90 Base) MCG/ACT inhaler, INHALE 2 PUFFS EVERY 4 HOURS AS NEEDED INTO THE LUNGS FOR WHEEZING OR SHORTNESS OF BREATH., Disp: 48 g, Rfl: 1 .  Probiotic Product (PROBIOTIC DAILY PO), Take 1 capsule by mouth daily., Disp: , Rfl:  .  Respiratory Therapy Supplies (FLUTTER) DEVI, Take as directed, Disp: 1 each, Rfl: 0 .  rosuvastatin (CRESTOR) 10 MG tablet, 1 pill 2 times a week, Disp: 30 tablet, Rfl: 3 No current facility-administered medications for this visit.   Facility-Administered Medications Ordered in Other Visits:  .  ipratropium-albuterol (DUONEB) 0.5-2.5 (3) MG/3ML nebulizer solution 3 mL, 3 mL, Nebulization, Q6H, [place], Sarah M, NP .  sodium chloride flush (NS) 0.9 % injection 10 mL, 10 mL, Intravenous, PRN, Volanda Napoleon, MD, 10 mL at 05/11/18 0859  Allergies:  Allergies  Allergen Reactions  . Alendronate Other (See Comments)    "  made me choke" "made me choke" chokes "made me choke" chokes  . Risedronate Sodium Other (See Comments)    "made me choke"  "made me choke"  "made me choke"   . Sulfa Antibiotics Other (See Comments)    Doesn't remember   . Sulfasalazine Other (See Comments)    Doesn't remember   . Other Rash  . Tetracyclines & Related Rash    Past Medical History, Surgical history, Social history, and Family History were reviewed and updated.  Review of Systems: Review of Systems  Constitutional: Positive for malaise/fatigue.  HENT: Positive for congestion.   Eyes: Negative.     Respiratory: Positive for shortness of breath and wheezing.   Cardiovascular: Negative.   Gastrointestinal: Negative.   Genitourinary: Negative.   Musculoskeletal: Negative.   Skin: Negative.   Neurological: Negative.   Endo/Heme/Allergies: Negative.   Psychiatric/Behavioral: Negative.     Physical Exam:  weight is 120 lb (54.4 kg). Her blood pressure is 132/76. Her oxygen saturation is 99%.   Physical Exam  Constitutional: She is oriented to person, place, and time.  HENT:  Head: Normocephalic and atraumatic.  Mouth/Throat: Oropharynx is clear and moist.  Eyes: Pupils are equal, round, and reactive to light. EOM are normal.  Neck: Normal range of motion.  Cardiovascular: Normal rate, regular rhythm and normal heart sounds.  Pulmonary/Chest: Effort normal and breath sounds normal.  Abdominal: Soft. Bowel sounds are normal.  Musculoskeletal: Normal range of motion. She exhibits no edema, tenderness or deformity.  Lymphadenopathy:    She has no cervical adenopathy.  Neurological: She is alert and oriented to person, place, and time.  Skin: Skin is warm and dry. No rash noted. No erythema.  Psychiatric: She has a normal mood and affect. Her behavior is normal. Judgment and thought content normal.  Vitals reviewed.    Lab Results  Component Value Date   WBC 6.6 04/06/2018   HGB 14.5 04/06/2018   HCT 43.1 04/06/2018   MCV 88.5 04/06/2018   PLT 212 04/06/2018     Chemistry      Component Value Date/Time   NA 137 04/06/2018 0830   NA 141 07/13/2017 0743   NA 138 08/01/2015 1124   K 3.8 04/06/2018 0830   K 4.2 07/13/2017 0743   K 4.0 08/01/2015 1124   CL 102 04/06/2018 0830   CL 105 07/13/2017 0743   CO2 30 04/06/2018 0830   CO2 27 07/13/2017 0743   CO2 28 08/01/2015 1124   BUN 13 04/06/2018 0830   BUN 16 07/13/2017 0743   BUN 10.4 08/01/2015 1124   CREATININE 0.80 04/06/2018 0830   CREATININE 0.98 (H) 03/29/2018 1048   CREATININE 0.8 08/01/2015 1124       Component Value Date/Time   CALCIUM 9.9 04/06/2018 0830   CALCIUM 9.5 07/13/2017 0743   CALCIUM 9.6 08/01/2015 1124   ALKPHOS 53 04/06/2018 0830   ALKPHOS 68 07/13/2017 0743   ALKPHOS 88 08/01/2015 1124   AST 30 04/06/2018 0830   AST 23 08/01/2015 1124   ALT 24 04/06/2018 0830   ALT 19 07/13/2017 0743   ALT 12 08/01/2015 1124   BILITOT 0.8 04/06/2018 0830   BILITOT <0.30 08/01/2015 1124      Impression and Plan: Ms. Loh is 77 year old white female. She has mantle cell lymphoma. Again, by the [facility] she is an intermediate risk.  She received 2 different courses of chemotherapy. She could not tolerate R-CHOP with Velcade substituting for vincristine. This however was incredibly  helpful. We finished up with Rituxan/bendamustine. She completed this in August 2016.  We then started maintenance Rituxan. She started this in November 2016.  She completed this in November 2018.    I would like to see her back in another 2 months or so.  I want to make sure that everything is doing okay before the holidays.  She still has the Port-A-Cath in.  We need to make sure that she keeps a Port-A-Cath in for another year.  November, she would have completed 1 year post Rituxan.   Volanda Napoleon, MD 8/29/20199:01 AM

## 2018-05-12 LAB — IGG, IGA, IGM
IGA: 26 mg/dL — AB (ref 64–422)
IGM (IMMUNOGLOBULIN M), SRM: 6 mg/dL — AB (ref 26–217)
IgG (Immunoglobin G), Serum: 561 mg/dL — ABNORMAL LOW (ref 700–1600)

## 2018-05-19 ENCOUNTER — Telehealth: Payer: Self-pay | Admitting: *Deleted

## 2018-05-19 NOTE — Telephone Encounter (Signed)
Message received from patient inquiring about lab results from 05/11/18.  Call placed back to patient to inform her that labs are ok per Dr. Marin Olp.  Patient appreciative of call back and has no questions at this time.

## 2018-05-25 ENCOUNTER — Encounter: Payer: Self-pay | Admitting: Internal Medicine

## 2018-06-06 ENCOUNTER — Ambulatory Visit: Payer: PPO | Admitting: Podiatry

## 2018-06-06 ENCOUNTER — Ambulatory Visit (INDEPENDENT_AMBULATORY_CARE_PROVIDER_SITE_OTHER): Payer: PPO

## 2018-06-06 ENCOUNTER — Encounter: Payer: Self-pay | Admitting: Podiatry

## 2018-06-06 VITALS — BP 124/70 | HR 57 | Resp 16

## 2018-06-06 DIAGNOSIS — M2042 Other hammer toe(s) (acquired), left foot: Secondary | ICD-10-CM | POA: Diagnosis not present

## 2018-06-06 DIAGNOSIS — M2011 Hallux valgus (acquired), right foot: Secondary | ICD-10-CM | POA: Diagnosis not present

## 2018-06-06 DIAGNOSIS — L603 Nail dystrophy: Secondary | ICD-10-CM | POA: Diagnosis not present

## 2018-06-06 DIAGNOSIS — M2012 Hallux valgus (acquired), left foot: Secondary | ICD-10-CM | POA: Diagnosis not present

## 2018-06-06 DIAGNOSIS — M2041 Other hammer toe(s) (acquired), right foot: Secondary | ICD-10-CM

## 2018-06-07 NOTE — Progress Notes (Signed)
Subjective:  Patient ID: Shannon Parsons, female    DOB: December 21, 1940,  MRN: [id number] HPI Chief Complaint  Patient presents with  . Foot Pain    1st MPJ and toes bilateral - HAV and HT deformities, previous bunion surgery on both, concerned about toes getting worse  . Nail Problem    Toenails bilateral - thick and discolored, started after chemo  . New Patient (Initial Visit)    77 y.o. female presents with the above complaint.   ROS: Denies fever chills nausea vomiting muscle aches pains calf pain back pain chest pain shortness of breath.  Past Medical History:  Diagnosis Date  . Adenomatous colon polyp 1994  . Asthma   . Cataract 2013   bilateral eyes  . Diverticulosis   . Family history of ischemic heart disease   . Fibrocystic breast disease   . GERD (gastroesophageal reflux disease)   . Hiatal hernia   . Hyperlipidemia   . Hypertension    pt denies - 01/16/18  . IBS (irritable bowel syndrome)   . Internal hemorrhoids   . Mantle cell lymphoma ([place]) 10/18/2014  . Melanoma ([place])    Facial, pt denies on 01/16/18  . OSA (obstructive sleep apnea)   . Osteoarthritis   . Osteoporosis   . Prediabetes   . Unspecified hypothyroidism   . Vitamin D deficiency    Past Surgical History:  Procedure Laterality Date  . APPENDECTOMY    . BUNIONECTOMY    . CATARACT EXTRACTION, BILATERAL    . KNEE ARTHROSCOPY    . TONSILLECTOMY    . UMBILICAL HERNIA REPAIR    . VIDEO BRONCHOSCOPY Bilateral 10/12/2017   Procedure: VIDEO BRONCHOSCOPY WITHOUT FLUORO;  Surgeon: Tanda Rockers, MD;  Location: [facility];  Service: Endoscopy;  Laterality: Bilateral;    Current Outpatient Medications:  .  acetaminophen (TYLENOL) 325 MG tablet, Take 325 mg by mouth every 6 (six) hours as needed for moderate pain (For pain.). , Disp: , Rfl:  .  amoxicillin (AMOXIL) 500 MG capsule, Take 4 pills one hour before dental procedure., Disp: 12 capsule, Rfl: 0 .  b complex vitamins tablet, Take by mouth., Disp:  , Rfl:  .  Cholecalciferol (VITAMIN D3) 5000 units CAPS, Take 10,000 Units by mouth daily., Disp: , Rfl:  .  hyoscyamine (LEVSIN, ANASPAZ) 0.125 MG tablet, Take 1 to 2 tablets every 4 hours as needed for nausea, cramping, bloating or diarrhea (Patient taking differently: Take 0.125 mg by mouth every 4 (four) hours as needed for cramping. ), Disp: 90 tablet, Rfl: 11 .  ipratropium-albuterol (DUONEB) 0.5-2.5 (3) MG/3ML SOLN, Take 3 mLs by nebulization every 4 (four) hours as needed (heaviness in chest). , Disp: , Rfl:  .  levothyroxine (SYNTHROID, LEVOTHROID) 50 MCG tablet, TAKE 1 TABLET BY MOUTH ONCE DAILY., Disp: 90 tablet, Rfl: 4 .  lidocaine-prilocaine (EMLA) cream, Apply 1 application topically as needed. Apply to [facility] one hour before access., Disp: 30 g, Rfl: 3 .  Multiple Vitamins-Minerals (OCUVITE PO), Take by mouth., Disp: , Rfl:  .  PROAIR HFA 108 (90 Base) MCG/ACT inhaler, INHALE 2 PUFFS EVERY 4 HOURS AS NEEDED INTO THE LUNGS FOR WHEEZING OR SHORTNESS OF BREATH., Disp: 48 g, Rfl: 1 .  Probiotic Product (PROBIOTIC DAILY PO), Take 1 capsule by mouth daily., Disp: , Rfl:  .  Respiratory Therapy Supplies (FLUTTER) DEVI, Take as directed, Disp: 1 each, Rfl: 0 .  rosuvastatin (CRESTOR) 10 MG tablet, 1 pill 2 times a week, Disp:  30 tablet, Rfl: 3 No current facility-administered medications for this visit.   Facility-Administered Medications Ordered in Other Visits:  .  ipratropium-albuterol (DUONEB) 0.5-2.5 (3) MG/3ML nebulizer solution 3 mL, 3 mL, Nebulization, Q6H, [place], Holli Humbles, NP  Allergies  Allergen Reactions  . Alendronate Other (See Comments)    "made me choke" "made me choke" chokes "made me choke" chokes  . Risedronate Sodium Other (See Comments)    "made me choke"  "made me choke"  "made me choke"   . Sulfa Antibiotics Other (See Comments)    Doesn't remember   . Sulfasalazine Other (See Comments)    Doesn't remember   . Other Rash  . Tetracyclines & Related Rash     Review of Systems Objective:   Vitals:   06/06/18 1443  BP: 124/70  Pulse: (!) 57  Resp: 16    General: Well developed, nourished, in no acute distress, alert and oriented x3   Dermatological: Skin is warm, dry and supple bilateral. Nails x 10 are well maintained; remaining integument appears unremarkable at this time. There are no open sores, no preulcerative lesions, no rash or signs of infection present.  Vascular: Dorsalis Pedis artery and Posterior Tibial artery pedal pulses are 2/4 bilateral with immedate capillary fill time. Pedal hair growth present. No varicosities and no lower extremity edema present bilateral.   Neruologic: Grossly intact via light touch bilateral. Vibratory intact via tuning fork bilateral. Protective threshold with Semmes Wienstein monofilament intact to all pedal sites bilateral. Patellar and Achilles deep tendon reflexes 2+ bilateral. No Babinski or clonus noted bilateral.   Musculoskeletal: No gross boney pedal deformities bilateral. No pain, crepitus, or limitation noted with foot and ankle range of motion bilateral. Muscular strength 5/5 in all groups tested bilateral.  Hallux interphalangeal underlapping toes.  Gait: Unassisted, Nonantalgic.    Radiographs:  Radiographs taken today demonstrate internal fixation first metatarsal bilaterally status post bunion repair.  Interphalangeal him is present.  Mild hammertoe deformities present.    Assessment & Plan:   Assessment: Hallux interphalangeal with hammertoe deformities and nail dystrophy hallux bilateral.  Plan: Discussed with her today surgical options she declined follow-up as needed     Wilene Pharo T. [place], [place]

## 2018-06-28 NOTE — Progress Notes (Signed)
FOLLOW UP  Assessment and Plan:   Chronic obstructive airway disease with asthma (HCC) Symptoms stable, continue PRN inhalers Continue follow up pulmonary  Atherosclerosis of aorta Control blood pressure, cholesterol, glucose, increase exercise.   Hypotension Monitor blood pressure at home; patient to call if consistently greater than 130/80 or below 90/60  Continue DASH diet.   Reminder to go to the ER if any CP, SOB, nausea, dizziness, severe HA, changes vision/speech, left arm numbness and tingling and jaw pain.  Cholesterol Currently above goal; rosuvastatin 2.5 mg daily Continue low cholesterol diet and exercise.  Check lipid panel.   Prediabetes Continue diet and exercise.  Perform daily foot/skin check, notify office of any concerning changes.  Check A1C  BMI 21  Continue to recommend diet heavy in fruits and veggies and low in animal meats, cheeses, and dairy products, appropriate calorie intake Discuss exercise recommendations routinely Continue to monitor weight at each visit  Hypothyroidism continue medications the same pending lab results reminded to take on an empty stomach 30-57mins before food.  check TSH level  Vitamin D Def Near goal at last visit; on high dose (10000 IU daily)  continue supplementation  Defer Vit D level  Continue diet and meds as discussed. Further disposition pending results of labs. Discussed med's effects and SE's.   Over 30 minutes of exam, counseling, chart review, and critical decision making was performed.   Future Appointments  Date Time Provider [place]  06/29/2018 10:00 AM Liane Comber, NP [facility] None  07/20/2018 11:30 AM Dohmeier, Asencion Partridge, MD [facility] None  07/21/2018  1:00 PM [facility] None  07/21/2018  1:15 PM [facility]1 [facility] None  07/21/2018  1:30 PM Ennever, Rudell Cobb, MD [facility] None  09/26/2018  2:00 PM Unk Pinto, MD [facility] None     ----------------------------------------------------------------------------------------------------------------------  HPI 77 y.o. female  presents for 3 month follow up on hypertension, cholesterol, prediabetes, hypothyroid, weight and vitamin D deficiency. Patient was diagnosed with Mantle cell Lymphoma in Jan 2016 with induced remission by Sept 2016 with Chemoradiation per Dr Marin Olp and had been on maintainance through Nov 2018. She also is followed by Dr Melvyn Novas for recurrent pulmonary infections, with diagnosis of obstructive bronchiectasis and asthma.   BMI is Body mass index is 21.45 kg/m., she has been working on diet and exercise, walks daily outdoors, aims for 30 min daily as tolerated, and video programs. She drinks 3 bottles of water daily.  Wt Readings from Last 3 Encounters:  06/29/18 123 lb (55.8 kg)  05/11/18 120 lb (54.4 kg)  04/06/18 119 lb 8 oz (54.2 kg)   Her blood pressure has been controlled at home, today their BP is BP: 124/60  She does workout. She denies chest pain, shortness of breath, dizziness.   She is on cholesterol medication Rosuvastatin 2.5 mg daily and denies myalgias. Her cholesterol is not at goal. The cholesterol last visit was:   Lab Results  Component Value Date   CHOL 241 (H) 03/29/2018   HDL 70 03/29/2018   LDLCALC 142 (H) 03/29/2018   TRIG 156 (H) 03/29/2018   CHOLHDL 3.4 03/29/2018    She has been working on diet and exercise for prediabetes, and denies foot ulcerations, increased appetite, nausea, paresthesia of the feet, polydipsia, polyuria and visual disturbances. Last A1C in the office was:  Lab Results  Component Value Date   HGBA1C 5.8 (H) 03/29/2018   She is on thyroid medication. Her medication was not changed last visit.   Lab  Results  Component Value Date   TSH 2.73 03/29/2018   Patient is on Vitamin D supplement 10000 IU daily  Lab Results  Component Value Date   VD25OH 71 03/29/2018        Current Medications:   Current Outpatient Medications on File Prior to Visit  Medication Sig  . acetaminophen (TYLENOL) 325 MG tablet Take 325 mg by mouth every 6 (six) hours as needed for moderate pain (For pain.).   [name] amoxicillin (AMOXIL) 500 MG capsule Take 4 pills one hour before dental procedure.  [name] b complex vitamins tablet Take by mouth.  . Cholecalciferol (VITAMIN D3) 5000 units CAPS Take 10,000 Units by mouth daily.  . hyoscyamine (LEVSIN, ANASPAZ) 0.125 MG tablet Take 1 to 2 tablets every 4 hours as needed for nausea, cramping, bloating or diarrhea (Patient taking differently: Take 0.125 mg by mouth every 4 (four) hours as needed for cramping. )  . ipratropium-albuterol (DUONEB) 0.5-2.5 (3) MG/3ML SOLN Take 3 mLs by nebulization every 4 (four) hours as needed (heaviness in chest).   [name] levothyroxine (SYNTHROID, LEVOTHROID) 50 MCG tablet TAKE 1 TABLET BY MOUTH ONCE DAILY.  [name] lidocaine-prilocaine (EMLA) cream Apply 1 application topically as needed. Apply to [facility] one hour before access.  . Multiple Vitamins-Minerals (OCUVITE PO) Take by mouth.  [name] PROAIR HFA 108 (90 Base) MCG/ACT inhaler INHALE 2 PUFFS EVERY 4 HOURS AS NEEDED INTO THE LUNGS FOR WHEEZING OR SHORTNESS OF BREATH.  . Probiotic Product (PROBIOTIC DAILY PO) Take 1 capsule by mouth daily.  [name] Respiratory Therapy Supplies (FLUTTER) DEVI Take as directed  . rosuvastatin (CRESTOR) 10 MG tablet 1 pill 2 times a week (Patient taking differently: 1/4 tablet every other day)   Current Facility-Administered Medications on File Prior to Visit  Medication  . ipratropium-albuterol (DUONEB) 0.5-2.5 (3) MG/3ML nebulizer solution 3 mL     Allergies:  Allergies  Allergen Reactions  . Alendronate Other (See Comments)    "made me choke" "made me choke" chokes "made me choke" chokes  . Risedronate Sodium Other (See Comments)    "made me choke"  "made me choke"  "made me choke"   . Sulfa Antibiotics Other (See Comments)    Doesn't remember   .  Sulfasalazine Other (See Comments)    Doesn't remember   . Other Rash  . Tetracyclines & Related Rash     Medical History:  Past Medical History:  Diagnosis Date  . Adenomatous colon polyp 1994  . Asthma   . Cataract 2013   bilateral eyes  . Diverticulosis   . Family history of ischemic heart disease   . Fibrocystic breast disease   . GERD (gastroesophageal reflux disease)   . Hiatal hernia   . Hyperlipidemia   . Hypertension    pt denies - 01/16/18  . IBS (irritable bowel syndrome)   . Internal hemorrhoids   . Mantle cell lymphoma ([place]) 10/18/2014  . Melanoma ([place])    Facial, pt denies on 01/16/18  . OSA (obstructive sleep apnea)   . Osteoarthritis   . Osteoporosis   . Prediabetes   . Unspecified hypothyroidism   . Vitamin D deficiency    Family history- Reviewed and unchanged Social history- Reviewed and unchanged   Review of Systems:  Review of Systems  Constitutional: Negative for malaise/fatigue and weight loss.  HENT: Negative for hearing loss and tinnitus.   Eyes: Negative for blurred vision and double vision.  Respiratory: Negative for cough, shortness of breath and wheezing.  Cardiovascular: Negative for chest pain, palpitations, orthopnea, claudication and leg swelling.  Gastrointestinal: Negative for abdominal pain, blood in stool, constipation, diarrhea, heartburn, melena, nausea and vomiting.  Genitourinary: Negative.   Musculoskeletal: Negative for joint pain and myalgias.  Skin: Negative for rash.  Neurological: Negative for dizziness, tingling, sensory change, weakness and headaches.  Endo/Heme/Allergies: Negative for polydipsia.  Psychiatric/Behavioral: Negative.   All other systems reviewed and are negative.    Physical Exam: BP 124/60   Pulse (!) 50   Temp (!) 97.3 F (36.3 C)   Wt 123 lb (55.8 kg)   LMP 09/14/2014   SpO2 99%   BMI 21.45 kg/m  Wt Readings from Last 3 Encounters:  06/29/18 123 lb (55.8 kg)  05/11/18 120 lb (54.4 kg)   04/06/18 119 lb 8 oz (54.2 kg)   General Appearance: Frail appearing elder, in no apparent distress. Eyes: PERRLA, EOMs, conjunctiva no swelling or erythema Sinuses: No Frontal/maxillary tenderness ENT/Mouth: Ext aud canals clear, TMs without erythema, bulging. No erythema, swelling, or exudate on post pharynx.  Tonsils not swollen or erythematous. Hearing normal.  Neck: Supple, thyroid normal.  Respiratory: Respiratory effort normal, BS equal bilaterally without rales, rhonchi, wheezing or stridor.  Cardio: RRR with no MRGs. Brisk peripheral pulses without edema.  Abdomen: Soft, + BS.  Non tender, no guarding, rebound, hernias, masses. Lymphatics: Non tender without lymphadenopathy.  Musculoskeletal: Full ROM, 5/5 strength, Normal gait Skin: Warm, dry without rashes, lesions, ecchymosis.  Neuro: Cranial nerves intact. No cerebellar symptoms.  Psych: Awake and oriented X 3, normal affect, Insight and Judgment appropriate.    Izora Ribas, NP 9:54 AM [facility]

## 2018-06-29 ENCOUNTER — Ambulatory Visit (INDEPENDENT_AMBULATORY_CARE_PROVIDER_SITE_OTHER): Payer: PPO | Admitting: Adult Health

## 2018-06-29 ENCOUNTER — Encounter: Payer: Self-pay | Admitting: Adult Health

## 2018-06-29 ENCOUNTER — Ambulatory Visit: Payer: Self-pay | Admitting: Adult Health

## 2018-06-29 VITALS — BP 124/60 | HR 50 | Temp 97.3°F | Wt 123.0 lb

## 2018-06-29 DIAGNOSIS — I700 Atherosclerosis of aorta: Secondary | ICD-10-CM | POA: Diagnosis not present

## 2018-06-29 DIAGNOSIS — D649 Anemia, unspecified: Secondary | ICD-10-CM

## 2018-06-29 DIAGNOSIS — E785 Hyperlipidemia, unspecified: Secondary | ICD-10-CM | POA: Diagnosis not present

## 2018-06-29 DIAGNOSIS — R7303 Prediabetes: Secondary | ICD-10-CM | POA: Diagnosis not present

## 2018-06-29 DIAGNOSIS — J4489 Other specified chronic obstructive pulmonary disease: Secondary | ICD-10-CM

## 2018-06-29 DIAGNOSIS — Z79899 Other long term (current) drug therapy: Secondary | ICD-10-CM

## 2018-06-29 DIAGNOSIS — G4701 Insomnia due to medical condition: Secondary | ICD-10-CM | POA: Diagnosis not present

## 2018-06-29 DIAGNOSIS — E559 Vitamin D deficiency, unspecified: Secondary | ICD-10-CM | POA: Diagnosis not present

## 2018-06-29 DIAGNOSIS — Z6820 Body mass index (BMI) 20.0-20.9, adult: Secondary | ICD-10-CM | POA: Diagnosis not present

## 2018-06-29 DIAGNOSIS — J449 Chronic obstructive pulmonary disease, unspecified: Secondary | ICD-10-CM

## 2018-06-29 MED ORDER — ALPRAZOLAM 0.5 MG PO TABS: ORAL_TABLET | ORAL | 0 refills | Status: AC

## 2018-06-29 NOTE — Patient Instructions (Addendum)
Goals    . HEMOGLOBIN A1C < 5.7    . LDL CALC < 100      Know what a healthy weight is for you (roughly BMI <25) and aim to maintain this  Aim for 7+ servings of fruits and vegetables daily  65-80+ fluid ounces of water or unsweet tea for healthy kidneys  Limit to max 1 drink of alcohol per day; avoid smoking/tobacco  Limit animal fats in diet for cholesterol and heart health - choose grass fed whenever available  Avoid highly processed foods, and foods high in saturated/trans fats  Aim for low stress - take time to unwind and care for your mental health  Aim for 150 min of moderate intensity exercise weekly for heart health, and weights twice weekly for bone health  Aim for 7-9 hours of sleep daily     Insulin Resistance Insulin is a hormone that helps to control blood sugar (glucose) levels in the body. It is made in the pancreas. Insulin allows glucose to enter cells in the body. Insulin sensitivity refers to how the body responds to insulin. Insulin resistance occurs when cells in the body do not respond properly to insulin made by the pancreas and are not able to absorb glucose from the bloodstream. Insulin resistance results in high blood glucose levels (hyperglycemia) and can lead to problems, including:  Prediabetes.  Type 2 diabetes (type 2 diabetes mellitus).  Heart disease.  High blood pressure (hypertension).  Stroke.  Polycystic ovarian syndrome (PCOS).  Nonalcoholic fatty liver disease.  What are the causes? The exact cause of insulin resistance is not known. What increases the risk? The following factors may make you more likely to develop insulin resistance:  Being overweight or obese, especially if a lot of your weight is in your waist area.  Having an inactive (sedentary) lifestyle.  Using steroids.  Being older than 23.  Having sleep apnea.  Using tobacco products.  What are the signs or symptoms? This condition usually does not cause  symptoms. How is this diagnosed? There is no test to diagnose insulin resistance. However, your health care provider may diagnose insulin resistance based on:  Your blood glucose levels.  Your cholesterol levels.  A measurement of the distance around your waist (circumference). A waist circumference of more than 35 inches (88.9 cm) for women and more than 40 inches (101.6 cm) for men may be a sign of insulin resistance.  Your risk factors.  A physical exam.  Your medical history.  How is this treated? Insulin resistance is treated with nutrition and lifestyle changes. These changes may include:  Eating a healthy balance of nutritious foods.  Getting more physical activity.  Maintaining a healthy weight.  Stopping the use of any tobacco products.  Your health care provider will work with you to change your nutrition and lifestyle as needed. In some cases, treatment may also include medicine to improve your insulin sensitivity. Follow these instructions at home:  Be physically active. ? Do moderate-intensity physical activity for at least 30 minutes on at least 5 days of the week, or as much as told by your health care provider. This could be brisk walking, biking, or water aerobics. ? Ask your health care provider what activities are safe for you. A mix of physical activities may be best, such as walking, swimming, cycling, and strength training.  Lose weight as told by your health care provider. ? Losing 5-7% of your body weight can reverse insulin resistance. ? Your  health care provider can determine how much weight loss is best for you and can help you lose weight safely.  Follow a healthy meal plan. This includes eating lean proteins, complex carbohydrates, fresh fruits and vegetables, low-fat dairy products, and healthy fats. ? Follow instructions from your health care provider about eating or drinking restrictions. ? Make an appointment to see a diet and nutrition  specialist (registered dietitian) to help you create a healthy eating plan.  Check your blood glucose levels as told by your health care provider.  Take over-the-counter and prescription medicines only as told by your health care provider.  Do not use any tobacco products, such as cigarettes, chewing tobacco, and e-cigarettes. If you need help quitting, ask your health care provider.  Keep all follow-up visits as told by your health care provider. This is important. Contact a health care provider if:  You have trouble losing weight or maintaining your goal weight.  You gain weight.  You have trouble following your prescribed meal plan.  You have trouble exercising more. This information is not intended to replace advice given to you by your health care provider. Make sure you discuss any questions you have with your health care provider. Document Released: 10/19/2005 Document Revised: 02/05/2016 Document Reviewed: 10/03/2015 Elsevier Interactive Patient Education  [employer].

## 2018-06-30 ENCOUNTER — Other Ambulatory Visit: Payer: Self-pay | Admitting: Internal Medicine

## 2018-06-30 LAB — LIPID PANEL
Cholesterol: 175 mg/dL (ref <200)
HDL: 65 mg/dL (ref >50)
LDL Cholesterol (Calc): 92 mg/dL (calc)
Non-HDL Cholesterol (Calc): 110 mg/dL (calc) (ref <130)
Total CHOL/HDL Ratio: 2.7 (calc) (ref <5.0)
Triglycerides: 85 mg/dL (ref <150)

## 2018-06-30 LAB — HEMOGLOBIN A1C
Hgb A1c MFr Bld: 5.6 % of total Hgb (ref <5.7)
MEAN PLASMA GLUCOSE: 114 (calc)
eAG (mmol/L): 6.3 (calc)

## 2018-06-30 LAB — TSH: TSH: 2.87 mIU/L (ref 0.40–4.50)

## 2018-07-07 ENCOUNTER — Other Ambulatory Visit: Payer: Self-pay | Admitting: Internal Medicine

## 2018-07-07 MED ORDER — PROMETHAZINE-DM 6.25-15 MG/5ML PO SYRP: ORAL_SOLUTION | ORAL | 0 refills | Status: DC

## 2018-07-07 MED ORDER — AMOXICILLIN 500 MG PO CAPS: ORAL_CAPSULE | ORAL | 0 refills | Status: DC

## 2018-07-13 ENCOUNTER — Encounter: Payer: Self-pay | Admitting: Neurology

## 2018-07-20 ENCOUNTER — Encounter: Payer: Self-pay | Admitting: Neurology

## 2018-07-20 ENCOUNTER — Ambulatory Visit: Payer: PPO | Admitting: Neurology

## 2018-07-20 VITALS — BP 126/70 | HR 71 | Ht 63.0 in | Wt 122.0 lb

## 2018-07-20 DIAGNOSIS — T451X5A Adverse effect of antineoplastic and immunosuppressive drugs, initial encounter: Principal | ICD-10-CM

## 2018-07-20 DIAGNOSIS — J4542 Moderate persistent asthma with status asthmaticus: Secondary | ICD-10-CM | POA: Diagnosis not present

## 2018-07-20 DIAGNOSIS — J470 Bronchiectasis with acute lower respiratory infection: Secondary | ICD-10-CM | POA: Diagnosis not present

## 2018-07-20 DIAGNOSIS — Z9989 Dependence on other enabling machines and devices: Secondary | ICD-10-CM

## 2018-07-20 DIAGNOSIS — G4733 Obstructive sleep apnea (adult) (pediatric): Secondary | ICD-10-CM | POA: Diagnosis not present

## 2018-07-20 DIAGNOSIS — C8600 Extranodal NK/T-cell lymphoma, nasal type not having achieved remission: Secondary | ICD-10-CM

## 2018-07-20 DIAGNOSIS — J984 Other disorders of lung: Secondary | ICD-10-CM | POA: Diagnosis not present

## 2018-07-20 NOTE — Patient Instructions (Signed)

## 2018-07-20 NOTE — Progress Notes (Signed)
SLEEP MEDICINE CLINIC   Provider:  Larey Seat, M D  Primary Care Physician:  Unk Pinto, MD   Referring Provider: Unk Pinto, MD    Chief Complaint  Patient presents with  . New Patient (Initial Visit)    pt alone, rm 10. pt here today she has had a CPAP for about 7 years. she has stopped using the machine recently for several weeks pt states that she uses it intermittently because she will develop a cold and stop and when cold clears up attempts to use it again and then got bronchitis. received machine was 05/2013. states sleep studies were done prior to machine being ordered. needing to establish care with sleep MD  . Other    pt states sleep studies were done at the [facility] long area by Dr Annamaria Boots. no record on file in epic and studies were over 5 years ago would qualify to repeat and due for new machine.    HPI:  Shannon Parsons is a 77 y.o. female patient , seen here on 07-20-2018 in a referral  from Dr. Melford Aase for transfer of sleep apnea care.   Chief complaint according to patient : " I don't like using it ( CPAP ), and I do have frequent Bronchitis. I have lymphoma. I never saw a sleep doctor and was given an AUTO CPAP without titration. My baseline AHI 5 years ago was 16.0 / h". "I may not need it anymore ? "  I have the pleasure of seeing Shannon Parsons here, on 20 July 2018, as a new patient to my sleep clinic.    She states that over 5 years ago she had undergone an overnight polysomnographic study at [facility] long hospital location, interpreted by Dr. Baird Lyons.  She recalls that she had only mild apnea at an AHI of 16/h and was prescribed an auto CPAP soon after.  She had used the machine compliantly for several years but then developed more and more often upper airway infections bronchitis and sinusitis and felt that this may be triggered by CPAP use.  Just by end of September she restarted using CPAP to give [place] some data to.  From 20 September  through October she had used the machine 31 out of 35 days with an average daily use of time of 5 hours and 19 minutes, set pressure of 10 cm is seen on her download with 3 cm EPR and very high air leaks are noted.  The residual AHI is only 0.2/h a good resolution of apnea as noted but the air leaks are quite substantial.  She is using nasal pillows that were fitted especially for her to be worn with loops around the ears rather than a strap on the back of her head.   Sleep/ Medical history: 1)The patient had developed a plethora of new medical problems over the last 5 to 7 years.  She was diagnosed with atherosclerosis of the ascending aorta,  2)chronic obstructive airway disease with asthma -she is using inhalers as needed and continues to follow-up with pulmonary care.   3) hypotension blood pressure at home is as low as 90/60 mmHg.  She did continue with the DASH diet restricting her sodium intake, she does keep hydrated.   4)She has hypercholesterolemia on rosuvastatin-Crestor.  5) Prediabetes.  6) Hypothyroidism. 7) Vitamin D deficiency.   8)The patient was diagnosed in January 2016 with a mantle cell Lymphoma and by September 2016 was involved and chemotherapy with Dr. Marin Olp  and maintained through November 2018 she did achieve remission.  Dr. Melvyn Novas  has followed her for frequent pulmonary infections with diagnosis of obstructive bronchiectasis and asthma.  Her BMI is 21.45. 9) Tremor evaluation , had it for years, seen by R.Tat, DO- she confirmed is was not PD, but essential tremor.  10) asthma all my life. I reviewed her medication and her recent lab results which included TSH of 2.73, a vit D  level of 55, HbA1c at 5.8, total cholesterol 241.   Sleep habits are as follows: Dinner time at 6 PM, bedtime is 10 PM. The bedroom is cool, quiet and dark- no TV in the bedroom.  She has preferred reading in bed, she sleeps supine since using CPAP, on two pillows. Most nights asleep within 30 minutes.  She has one nocturia , and goes back to sleep- while not using CPAP recently. No gasping, no palpitation, no chest pains.  She rises at 7 AM , feeling now refreshed and better than when she used CPAP. ( Recovering from bronchitis, rhinitis, and asthma. She was given Rituximab - immune suppressed).  Rarely napping in daytime. If  Taking a nap it will last 1 hour.    Family sleep history: no family members with sleep apnea.   Social history: married, childless, husband has health problems, too. No pets in the home.  She drinks coffee 2 cups a day, iced tea rarely- decaff only, and sodas - none.  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    Review of Systems: Out of a complete 14 system review, the patient complains of only the following symptoms, and all other reviewed systems are negative.   The patient reports that she sometimes has insomnia but it is usually when she is under stress or something worries her a lot, she knows that she is snoring but she has not complained of snoring herself awake or feeling that snoring impairs her quality of sleep.  She has an essential tremor which has been confirmed to be essential by a movement disorder specialist.  She has high cholesterol but had no vascular or arterial disease is related to it.  She had lymphoma was diagnosed 3 years ago and is in remission.  She has used her CPAP recently only sporadically and each time contracted an upper airway infection-inflammation.  Epworth score 2/ 24  , Fatigue severity score 18/ 63   , depression score 5/15    Social History   Socioeconomic History  . Marital status: Married    Spouse name: Not on file  . Number of children: 0  . Years of education: 39  . Highest education level: Not on file  Occupational History  . Occupation: retired from office work  Social Needs  . Financial resource strain: Not on file  . Food insecurity:    Worry: Not on file    Inability: Not on file  .  Transportation needs:    Medical: Not on file    Non-medical: Not on file  Tobacco Use  . Smoking status: Never Smoker  . Smokeless tobacco: Never Used  . Tobacco comment: Never Used Tobacco  Substance and Sexual Activity  . Alcohol use: No    Alcohol/week: 0.0 standard drinks  . Drug use: No  . Sexual activity: Not on file  Lifestyle  . Physical activity:    Days per week: Not on file    Minutes per session: Not on file  . Stress: Not on  file  Relationships  . Social connections:    Talks on phone: Not on file    Gets together: Not on file    Attends religious service: Not on file    Active member of club or organization: Not on file    Attends meetings of clubs or organizations: Not on file    Relationship status: Not on file  . Intimate partner violence:    Fear of current or ex partner: Not on file    Emotionally abused: Not on file    Physically abused: Not on file    Forced sexual activity: Not on file  Other Topics Concern  . Not on file  Social History Narrative   Patient lives with her husband in a two story home.  Has no children.  Retired from office work.  Education: some college.     Family History  Problem Relation Age of Onset  . Emphysema Father   . Heart disease Father   . Heart attack Father 21       Died  . Heart disease Brother 59  . Rheum arthritis Sister   . Hypertension Mother   . Thyroid disease Mother   . Colon cancer Neg Hx   . Stomach cancer Neg Hx     Past Medical History:  Diagnosis Date  . Adenomatous colon polyp 1994  . Asthma   . Cataract 2013   bilateral eyes  . Diverticulosis   . Family history of ischemic heart disease   . Fibrocystic breast disease   . GERD (gastroesophageal reflux disease)   . Hiatal hernia   . Hyperlipidemia   . Hypertension    pt denies - 01/16/18  . IBS (irritable bowel syndrome)   . Internal hemorrhoids   . Mantle cell lymphoma ([place]) 10/18/2014  . Melanoma ([place])    Facial, pt denies on 01/16/18  .  OSA (obstructive sleep apnea)   . Osteoarthritis   . Osteoporosis   . Prediabetes   . Unspecified hypothyroidism   . Vitamin D deficiency     Past Surgical History:  Procedure Laterality Date  . APPENDECTOMY    . BUNIONECTOMY    . CATARACT EXTRACTION, BILATERAL    . KNEE ARTHROSCOPY    . TONSILLECTOMY    . UMBILICAL HERNIA REPAIR    . VIDEO BRONCHOSCOPY Bilateral 10/12/2017   Procedure: VIDEO BRONCHOSCOPY WITHOUT FLUORO;  Surgeon: Tanda Rockers, MD;  Location: [facility] ENDOSCOPY;  Service: Endoscopy;  Laterality: Bilateral;    Current Outpatient Medications  Medication Sig Dispense Refill  . acetaminophen (TYLENOL) 325 MG tablet Take 325 mg by mouth every 6 (six) hours as needed for moderate pain (For pain.).     [name] ALPRAZolam (XANAX) 0.5 MG tablet Take 1/2 to 1 tablet 3 x day  as needed for anxiety or sleep. Avoid taking daily, limit to <5 days/week. 90 tablet 0  . amoxicillin (AMOXIL) 500 MG capsule Take 1 capsule 3 x /day for infection 21 capsule 0  . b complex vitamins tablet Take by mouth.    . Cholecalciferol (VITAMIN D3) 5000 units CAPS Take 10,000 Units by mouth daily.    . hyoscyamine (LEVSIN, ANASPAZ) 0.125 MG tablet Take 1 to 2 tablets every 4 hours as needed for nausea, cramping, bloating or diarrhea (Patient taking differently: Take 0.125 mg by mouth every 4 (four) hours as needed for cramping. ) 90 tablet 11  . ipratropium-albuterol (DUONEB) 0.5-2.5 (3) MG/3ML SOLN Take 3 mLs by nebulization every 4 (four)  hours as needed (heaviness in chest).     [name] levothyroxine (SYNTHROID, LEVOTHROID) 50 MCG tablet TAKE 1 TABLET BY MOUTH ONCE DAILY. 90 tablet 3  . lidocaine-prilocaine (EMLA) cream Apply 1 application topically as needed. Apply to [facility] one hour before access. 30 g 3  . Multiple Vitamins-Minerals (OCUVITE PO) Take by mouth.    [name] PROAIR HFA 108 (90 Base) MCG/ACT inhaler INHALE 2 PUFFS EVERY 4 HOURS AS NEEDED INTO THE LUNGS FOR WHEEZING OR SHORTNESS OF BREATH. 48 g 1  .  Probiotic Product (PROBIOTIC DAILY PO) Take 1 capsule by mouth daily.    . promethazine-dextromethorphan (PROMETHAZINE-DM) 6.25-15 MG/5ML syrup Take 1 to 2 tsp enery 4 hours if needed for cough 360 mL 0  . Respiratory Therapy Supplies (FLUTTER) DEVI Take as directed 1 each 0  . rosuvastatin (CRESTOR) 10 MG tablet 1 pill 2 times a week (Patient taking differently: 1/4 tablet every other day) 30 tablet 3   No current facility-administered medications for this visit.    Facility-Administered Medications Ordered in Other Visits  Medication Dose Route Frequency Provider Last Rate Last Dose  . ipratropium-albuterol (DUONEB) 0.5-2.5 (3) MG/3ML nebulizer solution 3 mL  3 mL Nebulization Q6H [place], Holli Humbles, NP        Allergies as of 07/20/2018 - Review Complete 07/20/2018  Allergen Reaction Noted  . Alendronate Other (See Comments) 09/15/2013  . Risedronate sodium Other (See Comments) 09/15/2013  . Sulfa antibiotics Other (See Comments) 09/15/2013  . Sulfasalazine Other (See Comments) 09/15/2013  . Other Rash 03/16/2011  . Tetracyclines & related Rash 03/16/2011    Vitals: BP 126/70   Pulse 71   Ht 5\' 3"  (1.6 m)   Wt 122 lb (55.3 kg)   LMP 09/14/2014   BMI 21.61 kg/m  Last Weight:  Wt Readings from Last 1 Encounters:  07/20/18 122 lb (55.3 kg)   IRS:WNIO mass index is 21.61 kg/m.     Last Height:   Ht Readings from Last 1 Encounters:  07/20/18 5\' 3"  (1.6 m)    Physical exam:  General: The patient is awake, alert and appears not in acute distress. The patient is well groomed. Head: Normocephalic, atraumatic. Titubation noted. Neck is supple. Mallampati 3, uvula deviated to the right.  neck circumference:14. 75. Nasal airflow patent - just recovering form congestion. ,  Retrognathia is seen.  Cardiovascular:  Regular rate and rhythm, without  murmurs or carotid bruit, and without distended neck veins. Respiratory: Lungs are clear to auscultation. Skin:  Without evidence of  edema, or rash Trunk: BMI is 21.62. The patient's posture is erect - very erect.   Neurologic exam :The patient is awake and alert, oriented to place and time.   Memory subjective  described as intact.  Attention span & concentration ability appears normal.  Speech is fluent,  without  dysarthria, mild dysphonia , no aphasia.  Mood and affect are appropriate.  Cranial nerves: Pupils are equal and briskly reactive to light. Funduscopic exam without evidence of pallor or edema. Extraocular movements  in vertical and horizontal planes intact and without nystagmus. Visual fields by finger perimetry are intact. Hearing to finger rub intact. Facial sensation intact to fine touch. Facial motor strength is symmetric with a left sided mild ptosis.  her tongue and uvula without tremor- tongue is  midline. Uvula is slightly deviated to the right.  Shoulder shrug was symmetrical.   Motor exam:  Normal tone, muscle bulk and symmetric strength in all extremities. Sensory:  Fine  touch, pinprick and vibration were tested in all extremities. Proprioception tested in the upper extremities was normal. Coordination:  Finger-to-nose maneuver  normal without evidence of ataxia, dysmetria but there is an action tremor/ symmetric  tremor. Gait and station: Patient walks without assistive device.  Strength within normal limits.  Stance is stable and normal.   Turns with  3 Steps. Romberg testing is negative. Deep tendon reflexes: in the  upper and lower extremities are symmetric and intact. Babinski maneuver response is downgoing.    Assessment:  After physical and neurologic examination, review of laboratory studies,  Personal review of imaging studies, reports of other /same  Imaging studies, results of polysomnography and / or neurophysiology testing and pre-existing records as far as provided in visit., my assessment is   1) Mrs. Demonbreun is a pleasant 77 year old Caucasian right-handed female patient with a  history of essential tremor, lymphoma in remission, lifelong asthmatic, but she developed bronchiectasis and restrictive airway disease to a much greater degree likely associated with chemotherapy the treatment of lymphoma.  She has been diagnosed with a mild sleep apnea before the lymphoma was of unknown and for years CPAP did her well but now she feels that CPAP actually contributes to recurrent airway infections.  Just recently she gave it a good trial for about 35 days again highly compliant and had to stop again because of upper airway infections and bronchitis.  My goal will be to determine the degree of apnea the degree of possible comorbidities such as arrhythmia, hypoxemia, and the presence of snoring or parasomnia.  If the patient's apnea has actually remained mild and is not associated with oxygen desaturation I would encourage her to get off the CPAP machine.  She has a significant degree of red prognathia, she is slender she has a sling neck and I think she will be helped with a dental device that would advance the mandible forward rather than using CPAP if her AHI is 10 or less. She was much heavier at the time of he last sleep study. Should she need CPAP we will urge her to use a so clean device as well.   The patient was advised of the nature of the diagnosed disorder , the treatment options and the  risks for general health and wellness arising from not treating the condition.   I spent more than 45  minutes of face to face time with the patient.  Greater than 50% of time was spent in counseling and coordination of care. We have discussed the diagnosis and differential and I answered the patient's questions.    Plan:  Treatment plan and additional workup :  I would like for Mrs. Holberg to undergo an attended sleep study, my goal is to have a 2-hour observation.  And to start CPAP only if her AHI has reached 10 or higher or if she has associated hypoxemia, in that case I would ask for a  re-titration even with an AHI of 5.  Larey Seat, MD 46/01/6811, 75:17 PM  Certified in Neurology by ABPN Certified in [place] by [facility] [address] [place], [place] [address]

## 2018-07-21 ENCOUNTER — Encounter: Payer: Self-pay | Admitting: Hematology & Oncology

## 2018-07-21 ENCOUNTER — Inpatient Hospital Stay: Payer: PPO | Attending: Hematology & Oncology | Admitting: Hematology & Oncology

## 2018-07-21 ENCOUNTER — Inpatient Hospital Stay: Payer: PPO

## 2018-07-21 ENCOUNTER — Other Ambulatory Visit: Payer: Self-pay

## 2018-07-21 VITALS — BP 125/74 | HR 74 | Temp 98.4°F | Resp 18 | Wt 121.0 lb

## 2018-07-21 DIAGNOSIS — Z452 Encounter for adjustment and management of vascular access device: Secondary | ICD-10-CM | POA: Diagnosis not present

## 2018-07-21 DIAGNOSIS — J479 Bronchiectasis, uncomplicated: Secondary | ICD-10-CM | POA: Diagnosis not present

## 2018-07-21 DIAGNOSIS — C8310 Mantle cell lymphoma, unspecified site: Secondary | ICD-10-CM

## 2018-07-21 DIAGNOSIS — D801 Nonfamilial hypogammaglobulinemia: Secondary | ICD-10-CM | POA: Insufficient documentation

## 2018-07-21 DIAGNOSIS — Z79899 Other long term (current) drug therapy: Secondary | ICD-10-CM | POA: Diagnosis not present

## 2018-07-21 DIAGNOSIS — K5790 Diverticulosis of intestine, part unspecified, without perforation or abscess without bleeding: Secondary | ICD-10-CM | POA: Diagnosis not present

## 2018-07-21 DIAGNOSIS — R5381 Other malaise: Secondary | ICD-10-CM | POA: Diagnosis not present

## 2018-07-21 DIAGNOSIS — Z9221 Personal history of antineoplastic chemotherapy: Secondary | ICD-10-CM | POA: Insufficient documentation

## 2018-07-21 DIAGNOSIS — Z9225 Personal history of immunosupression therapy: Secondary | ICD-10-CM | POA: Diagnosis not present

## 2018-07-21 DIAGNOSIS — R109 Unspecified abdominal pain: Secondary | ICD-10-CM | POA: Diagnosis not present

## 2018-07-21 DIAGNOSIS — R5383 Other fatigue: Secondary | ICD-10-CM | POA: Diagnosis not present

## 2018-07-21 DIAGNOSIS — Z95828 Presence of other vascular implants and grafts: Secondary | ICD-10-CM

## 2018-07-21 LAB — CMP (CANCER CENTER ONLY)
ALK PHOS: 61 U/L (ref 26–84)
ALT: 14 U/L (ref 10–47)
AST: 32 U/L (ref 11–38)
Albumin: 3.8 g/dL (ref 3.5–5.0)
Anion gap: 10 (ref 5–15)
BUN: 12 mg/dL (ref 7–22)
CHLORIDE: 105 mmol/L (ref 98–108)
CO2: 28 mmol/L (ref 18–33)
Calcium: 9.7 mg/dL (ref 8.0–10.3)
Creatinine: 1 mg/dL (ref 0.60–1.20)
Glucose, Bld: 114 mg/dL (ref 73–118)
POTASSIUM: 3.9 mmol/L (ref 3.3–4.7)
SODIUM: 143 mmol/L (ref 128–145)
Total Bilirubin: 0.6 mg/dL (ref 0.2–1.6)
Total Protein: 6.2 g/dL — ABNORMAL LOW (ref 6.4–8.1)

## 2018-07-21 LAB — CBC WITH DIFFERENTIAL (CANCER CENTER ONLY)
Abs Immature Granulocytes: 0.02 10*3/uL (ref 0.00–0.07)
BASOS PCT: 1 %
Basophils Absolute: 0.1 10*3/uL (ref 0.0–0.1)
EOS ABS: 0.3 10*3/uL (ref 0.0–0.5)
EOS PCT: 5 %
HCT: 41.1 % (ref 36.0–46.0)
HEMOGLOBIN: 13.1 g/dL (ref 12.0–15.0)
Immature Granulocytes: 0 %
Lymphocytes Relative: 22 %
Lymphs Abs: 1.3 10*3/uL (ref 0.7–4.0)
MCH: 28.6 pg (ref 26.0–34.0)
MCHC: 31.9 g/dL (ref 30.0–36.0)
MCV: 89.7 fL (ref 80.0–100.0)
Monocytes Absolute: 0.7 10*3/uL (ref 0.1–1.0)
Monocytes Relative: 11 %
NRBC: 0 % (ref 0.0–0.2)
Neutro Abs: 3.7 10*3/uL (ref 1.7–7.7)
Neutrophils Relative %: 61 %
PLATELETS: 223 10*3/uL (ref 150–400)
RBC: 4.58 MIL/uL (ref 3.87–5.11)
RDW: 13.7 % (ref 11.5–15.5)
WBC: 6.1 10*3/uL (ref 4.0–10.5)

## 2018-07-21 LAB — LACTATE DEHYDROGENASE: LDH: 169 U/L (ref 98–192)

## 2018-07-21 MED ORDER — HEPARIN SOD (PORK) LOCK FLUSH 100 UNIT/ML IV SOLN
500.0000 [IU] | Freq: Once | INTRAVENOUS | Status: AC
  Administered 2018-07-21: 500 [IU] via INTRAVENOUS
  Filled 2018-07-21: qty 5

## 2018-07-21 MED ORDER — SODIUM CHLORIDE 0.9% FLUSH
10.0000 mL | INTRAVENOUS | Status: DC | PRN
  Administered 2018-07-21: 10 mL via INTRAVENOUS
  Filled 2018-07-21: qty 10

## 2018-07-21 NOTE — Patient Instructions (Signed)
Implanted Port Insertion, Care After °This sheet gives you information about how to care for yourself after your procedure. Your health care provider may also give you more specific instructions. If you have problems or questions, contact your health care provider. °What can I expect after the procedure? °After your procedure, it is common to have: °· Discomfort at the port insertion site. °· Bruising on the skin over the port. This should improve over 3-4 days. ° °Follow these instructions at home: °Port care °· After your port is placed, you will get a manufacturer's information card. The card has information about your port. Keep this card with you at all times. °· Take care of the port as told by your health care provider. Ask your health care provider if you or a family member can get training for taking care of the port at home. A home health care nurse may also take care of the port. °· Make sure to remember what type of port you have. °Incision care °· Follow instructions from your health care provider about how to take care of your port insertion site. Make sure you: °? Wash your hands with soap and water before you change your bandage (dressing). If soap and water are not available, use hand sanitizer. °? Change your dressing as told by your health care provider. °? Leave stitches (sutures), skin glue, or adhesive strips in place. These skin closures may need to stay in place for 2 weeks or longer. If adhesive strip edges start to loosen and curl up, you may trim the loose edges. Do not remove adhesive strips completely unless your health care provider tells you to do that. °· Check your port insertion site every day for signs of infection. Check for: °? More redness, swelling, or pain. °? More fluid or blood. °? Warmth. °? Pus or a bad smell. °General instructions °· Do not take baths, swim, or use a hot tub until your health care provider approves. °· Do not lift anything that is heavier than 10 lb (4.5  kg) for a week, or as told by your health care provider. °· Ask your health care provider when it is okay to: °? Return to work or school. °? Resume usual physical activities or sports. °· Do not drive for 24 hours if you were given a medicine to help you relax (sedative). °· Take over-the-counter and prescription medicines only as told by your health care provider. °· Wear a medical alert bracelet in case of an emergency. This will tell any health care providers that you have a port. °· Keep all follow-up visits as told by your health care provider. This is important. °Contact a health care provider if: °· You cannot flush your port with saline as directed, or you cannot draw blood from the port. °· You have a fever or chills. °· You have more redness, swelling, or pain around your port insertion site. °· You have more fluid or blood coming from your port insertion site. °· Your port insertion site feels warm to the touch. °· You have pus or a bad smell coming from the port insertion site. °Get help right away if: °· You have chest pain or shortness of breath. °· You have bleeding from your port that you cannot control. °Summary °· Take care of the port as told by your health care provider. °· Change your dressing as told by your health care provider. °· Keep all follow-up visits as told by your health care provider. °  This information is not intended to replace advice given to you by your health care provider. Make sure you discuss any questions you have with your health care provider. °Document Released: 06/20/2013 Document Revised: 07/21/2016 Document Reviewed: 07/21/2016 °Elsevier Interactive Patient Education © 2017 [employer] ° °

## 2018-07-21 NOTE — Progress Notes (Signed)
Hematology and Oncology Follow Up Visit  Shannon Parsons [id number] 17-Nov-1940 77 y.o. 07/21/2018   Principle Diagnosis:   Mantle cell lymphoma (MCIPI = 5)  Thromboembolic event of the right leg  Acquired Hypogammaglobulinemia  Bronchiectasis  Current Therapy:    Rituxan - maintanence - s/p cycle #10 - to finish in 07/2017  IVIG - q  month -restart on 10/27/2017; last dose 01/05/2018     Interim History:  [name] is back for follow-up.  She now is having some issues with abdominal pain.  This is been going on for a few days.  Is in the left lower abdomen.  She does have some diverticulosis by her last PET scan.  There is been no diarrhea.  She is had no vomiting.  There may be a little bit of nausea.  She is taking some Gas-X for this.  She does not want anything else..  She is had a little bit of discomfort on the left chest wall.  There is no shortness of breath.  She is had no cough.  There has been no fever.  I cannot imagine that this is anything related to recurrent mental cell lymphoma since her PET scan that she had done back in August was unremarkable.  She and her husband do not have any big plans for the holidays.  Overall, her performance status is ECOG 1.   Medications:  Current Outpatient Medications:  .  acetaminophen (TYLENOL) 325 MG tablet, Take 325 mg by mouth every 6 (six) hours as needed for moderate pain (For pain.). , Disp: , Rfl:  .  ALPRAZolam (XANAX) 0.5 MG tablet, Take 1/2 to 1 tablet 3 x day  as needed for anxiety or sleep. Avoid taking daily, limit to <5 days/week., Disp: 90 tablet, Rfl: 0 .  amoxicillin (AMOXIL) 500 MG capsule, Take 1 capsule 3 x /day for infection, Disp: 21 capsule, Rfl: 0 .  b complex vitamins tablet, Take by mouth., Disp: , Rfl:  .  Cholecalciferol (VITAMIN D3) 5000 units CAPS, Take 10,000 Units by mouth daily., Disp: , Rfl:  .  hyoscyamine (LEVSIN, ANASPAZ) 0.125 MG tablet, Take 1 to 2 tablets every 4 hours as needed for  nausea, cramping, bloating or diarrhea (Patient taking differently: Take 0.125 mg by mouth every 4 (four) hours as needed for cramping. ), Disp: 90 tablet, Rfl: 11 .  ipratropium-albuterol (DUONEB) 0.5-2.5 (3) MG/3ML SOLN, Take 3 mLs by nebulization every 4 (four) hours as needed (heaviness in chest). , Disp: , Rfl:  .  levothyroxine (SYNTHROID, LEVOTHROID) 50 MCG tablet, TAKE 1 TABLET BY MOUTH ONCE DAILY., Disp: 90 tablet, Rfl: 3 .  lidocaine-prilocaine (EMLA) cream, Apply 1 application topically as needed. Apply to [facility] one hour before access., Disp: 30 g, Rfl: 3 .  Multiple Vitamins-Minerals (OCUVITE PO), Take by mouth., Disp: , Rfl:  .  PROAIR HFA 108 (90 Base) MCG/ACT inhaler, INHALE 2 PUFFS EVERY 4 HOURS AS NEEDED INTO THE LUNGS FOR WHEEZING OR SHORTNESS OF BREATH., Disp: 48 g, Rfl: 1 .  Probiotic Product (PROBIOTIC DAILY PO), Take 1 capsule by mouth daily., Disp: , Rfl:  .  promethazine-dextromethorphan (PROMETHAZINE-DM) 6.25-15 MG/5ML syrup, Take 1 to 2 tsp enery 4 hours if needed for cough, Disp: 360 mL, Rfl: 0 .  Respiratory Therapy Supplies (FLUTTER) DEVI, Take as directed, Disp: 1 each, Rfl: 0 .  rosuvastatin (CRESTOR) 10 MG tablet, 1 pill 2 times a week (Patient taking differently: 1/4 tablet every other day), Disp: 30  tablet, Rfl: 3 No current facility-administered medications for this visit.   Facility-Administered Medications Ordered in Other Visits:  .  ipratropium-albuterol (DUONEB) 0.5-2.5 (3) MG/3ML nebulizer solution 3 mL, 3 mL, Nebulization, Q6H, [place], Holli Humbles, NP  Allergies:  Allergies  Allergen Reactions  . Alendronate Other (See Comments)    "made me choke" "made me choke" chokes "made me choke" chokes  . Risedronate Sodium Other (See Comments)    "made me choke"  "made me choke"  "made me choke"   . Sulfa Antibiotics Other (See Comments)    Doesn't remember   . Sulfasalazine Other (See Comments)    Doesn't remember   . Other Rash  . Tetracyclines &  Related Rash    Past Medical History, Surgical history, Social history, and Family History were reviewed and updated.  Review of Systems: Review of Systems  Constitutional: Positive for malaise/fatigue.  HENT: Positive for congestion.   Eyes: Negative.   Respiratory: Positive for shortness of breath and wheezing.   Cardiovascular: Negative.   Gastrointestinal: Negative.   Genitourinary: Negative.   Musculoskeletal: Negative.   Skin: Negative.   Neurological: Negative.   Endo/Heme/Allergies: Negative.   Psychiatric/Behavioral: Negative.     Physical Exam:  weight is 121 lb (54.9 kg). Her temperature is 98.4 F (36.9 C). Her blood pressure is 125/74 and her pulse is 74. Her respiration is 18 and oxygen saturation is 100%.   Physical Exam  Constitutional: She is oriented to person, place, and time.  HENT:  Head: Normocephalic and atraumatic.  Mouth/Throat: Oropharynx is clear and moist.  Eyes: Pupils are equal, round, and reactive to light. EOM are normal.  Neck: Normal range of motion.  Cardiovascular: Normal rate, regular rhythm and normal heart sounds.  Pulmonary/Chest: Effort normal and breath sounds normal.  Abdominal: Soft. Bowel sounds are normal.  Musculoskeletal: Normal range of motion. She exhibits no edema, tenderness or deformity.  Lymphadenopathy:    She has no cervical adenopathy.  Neurological: She is alert and oriented to person, place, and time.  Skin: Skin is warm and dry. No rash noted. No erythema.  Psychiatric: She has a normal mood and affect. Her behavior is normal. Judgment and thought content normal.  Vitals reviewed.    Lab Results  Component Value Date   WBC 6.1 07/21/2018   HGB 13.1 07/21/2018   HCT 41.1 07/21/2018   MCV 89.7 07/21/2018   PLT 223 07/21/2018     Chemistry      Component Value Date/Time   NA 143 07/21/2018 1255   NA 141 07/13/2017 0743   NA 138 08/01/2015 1124   K 3.9 07/21/2018 1255   K 4.2 07/13/2017 0743   K 4.0  08/01/2015 1124   CL 105 07/21/2018 1255   CL 105 07/13/2017 0743   CO2 28 07/21/2018 1255   CO2 27 07/13/2017 0743   CO2 28 08/01/2015 1124   BUN 12 07/21/2018 1255   BUN 16 07/13/2017 0743   BUN 10.4 08/01/2015 1124   CREATININE 1.00 07/21/2018 1255   CREATININE 0.98 (H) 03/29/2018 1048   CREATININE 0.8 08/01/2015 1124      Component Value Date/Time   CALCIUM 9.7 07/21/2018 1255   CALCIUM 9.5 07/13/2017 0743   CALCIUM 9.6 08/01/2015 1124   ALKPHOS 61 07/21/2018 1255   ALKPHOS 68 07/13/2017 0743   ALKPHOS 88 08/01/2015 1124   AST 32 07/21/2018 1255   AST 23 08/01/2015 1124   ALT 14 07/21/2018 1255   ALT 19 07/13/2017  0743   ALT 12 08/01/2015 1124   BILITOT 0.6 07/21/2018 1255   BILITOT <0.30 08/01/2015 1124      Impression and Plan: Ms. Dykes is 77 year old white female. She has mantle cell lymphoma. Again, by the [facility] she is an intermediate risk.  She received 2 different courses of chemotherapy. She could not tolerate R-CHOP with Velcade substituting for vincristine. This however was incredibly helpful. We finished up with Rituxan/bendamustine. She completed this in August 2016.  We then started maintenance Rituxan. She started this in November 2016.  She completed this in November 2018.    For right now, we can just follow her along.  We do have to see her every couple months to flush her Port-A-Cath.  I think I can probably see her back in 4 months.  I do not see need for a PET scan at the present time.  I told her that she could try some Pepto-Bismol to help with this abdominal discomfort.  I do not know if it might be IBS.  If it persists, I think her family doctor could easily handle this.   Volanda Napoleon, MD 11/8/20192:00 PM

## 2018-07-22 LAB — IGG, IGA, IGM
IGG (IMMUNOGLOBIN G), SERUM: 567 mg/dL — AB (ref 700–1600)
IgA: 26 mg/dL — ABNORMAL LOW (ref 64–422)
IgM (Immunoglobulin M), Srm: 11 mg/dL — ABNORMAL LOW (ref 26–217)

## 2018-07-24 ENCOUNTER — Telehealth: Payer: Self-pay | Admitting: Neurology

## 2018-07-24 NOTE — Telephone Encounter (Signed)
Noted thanks °

## 2018-07-24 NOTE — Telephone Encounter (Signed)
Pt called wanting to inform Dr. Brett Fairy that she stated her CPAP in 2012.

## 2018-08-15 DIAGNOSIS — I83893 Varicose veins of bilateral lower extremities with other complications: Secondary | ICD-10-CM | POA: Diagnosis not present

## 2018-08-15 DIAGNOSIS — I83813 Varicose veins of bilateral lower extremities with pain: Secondary | ICD-10-CM | POA: Diagnosis not present

## 2018-08-24 DIAGNOSIS — I83893 Varicose veins of bilateral lower extremities with other complications: Secondary | ICD-10-CM | POA: Diagnosis not present

## 2018-08-24 DIAGNOSIS — I83813 Varicose veins of bilateral lower extremities with pain: Secondary | ICD-10-CM | POA: Diagnosis not present

## 2018-08-30 ENCOUNTER — Other Ambulatory Visit: Payer: Self-pay | Admitting: Hematology & Oncology

## 2018-08-30 DIAGNOSIS — F411 Generalized anxiety disorder: Secondary | ICD-10-CM

## 2018-09-19 DIAGNOSIS — I83813 Varicose veins of bilateral lower extremities with pain: Secondary | ICD-10-CM | POA: Diagnosis not present

## 2018-09-20 ENCOUNTER — Other Ambulatory Visit: Payer: Self-pay

## 2018-09-20 ENCOUNTER — Inpatient Hospital Stay: Payer: PPO | Attending: Hematology & Oncology

## 2018-09-20 VITALS — BP 139/68 | HR 62 | Temp 98.3°F | Resp 18

## 2018-09-20 DIAGNOSIS — C8310 Mantle cell lymphoma, unspecified site: Secondary | ICD-10-CM | POA: Diagnosis not present

## 2018-09-20 DIAGNOSIS — Z95828 Presence of other vascular implants and grafts: Secondary | ICD-10-CM

## 2018-09-20 MED ORDER — HEPARIN SOD (PORK) LOCK FLUSH 100 UNIT/ML IV SOLN
500.0000 [IU] | Freq: Once | INTRAVENOUS | Status: AC
  Administered 2018-09-20: 500 [IU] via INTRAVENOUS
  Filled 2018-09-20: qty 5

## 2018-09-20 MED ORDER — SODIUM CHLORIDE 0.9% FLUSH
10.0000 mL | INTRAVENOUS | Status: DC | PRN
  Administered 2018-09-20: 10 mL via INTRAVENOUS
  Filled 2018-09-20: qty 10

## 2018-09-26 ENCOUNTER — Encounter: Payer: Self-pay | Admitting: Internal Medicine

## 2018-10-04 ENCOUNTER — Ambulatory Visit (INDEPENDENT_AMBULATORY_CARE_PROVIDER_SITE_OTHER): Payer: PPO | Admitting: Internal Medicine

## 2018-10-04 ENCOUNTER — Encounter: Payer: Self-pay | Admitting: Internal Medicine

## 2018-10-04 VITALS — BP 122/68 | HR 60 | Temp 97.4°F | Ht 64.0 in | Wt 123.8 lb

## 2018-10-04 DIAGNOSIS — E782 Mixed hyperlipidemia: Secondary | ICD-10-CM

## 2018-10-04 DIAGNOSIS — Z136 Encounter for screening for cardiovascular disorders: Secondary | ICD-10-CM | POA: Diagnosis not present

## 2018-10-04 DIAGNOSIS — R7303 Prediabetes: Secondary | ICD-10-CM | POA: Diagnosis not present

## 2018-10-04 DIAGNOSIS — Z1212 Encounter for screening for malignant neoplasm of rectum: Secondary | ICD-10-CM

## 2018-10-04 DIAGNOSIS — Z79899 Other long term (current) drug therapy: Secondary | ICD-10-CM

## 2018-10-04 DIAGNOSIS — E559 Vitamin D deficiency, unspecified: Secondary | ICD-10-CM

## 2018-10-04 DIAGNOSIS — C8310 Mantle cell lymphoma, unspecified site: Secondary | ICD-10-CM

## 2018-10-04 DIAGNOSIS — I10 Essential (primary) hypertension: Secondary | ICD-10-CM | POA: Insufficient documentation

## 2018-10-04 DIAGNOSIS — E039 Hypothyroidism, unspecified: Secondary | ICD-10-CM | POA: Diagnosis not present

## 2018-10-04 DIAGNOSIS — Z0001 Encounter for general adult medical examination with abnormal findings: Secondary | ICD-10-CM

## 2018-10-04 DIAGNOSIS — Z8572 Personal history of non-Hodgkin lymphomas: Secondary | ICD-10-CM

## 2018-10-04 DIAGNOSIS — R0989 Other specified symptoms and signs involving the circulatory and respiratory systems: Secondary | ICD-10-CM | POA: Insufficient documentation

## 2018-10-04 DIAGNOSIS — G250 Essential tremor: Secondary | ICD-10-CM

## 2018-10-04 DIAGNOSIS — Z8249 Family history of ischemic heart disease and other diseases of the circulatory system: Secondary | ICD-10-CM

## 2018-10-04 DIAGNOSIS — M810 Age-related osteoporosis without current pathological fracture: Secondary | ICD-10-CM

## 2018-10-04 DIAGNOSIS — Z8579 Personal history of other malignant neoplasms of lymphoid, hematopoietic and related tissues: Secondary | ICD-10-CM

## 2018-10-04 DIAGNOSIS — R7309 Other abnormal glucose: Secondary | ICD-10-CM | POA: Insufficient documentation

## 2018-10-04 DIAGNOSIS — I700 Atherosclerosis of aorta: Secondary | ICD-10-CM

## 2018-10-04 DIAGNOSIS — Z0000 Encounter for general adult medical examination without abnormal findings: Secondary | ICD-10-CM | POA: Diagnosis not present

## 2018-10-04 DIAGNOSIS — Z1211 Encounter for screening for malignant neoplasm of colon: Secondary | ICD-10-CM

## 2018-10-04 NOTE — Progress Notes (Addendum)
[facility] Unk Pinto, M.D.     Uvaldo Bristle. Silverio Lay, P.A.-C Liane Comber, [place] [address] [place] [address] Telephone [phone] Telefax [phone] Annual Screening/Preventative Visit & Comprehensive Evaluation &  Examination     This very nice 78 y.o. MWF  presents for a Screening /Preventative Visit & comprehensive evaluation and management of multiple medical co-morbidities.  Patient has been followed for HTN, HLD, Hereditary Tremor, Hypothyroidism, Prediabetes  and Vitamin D Deficiency. Patient has hx/o Osteoporosis and repeatedly declines recommended therapies by myself & Der Ennever. Patient has hx/o COPD/Emphysema & is followed by Dr Melvyn Novas.      In Jan 2016, she was dx'd w/Mantle cell Lymphoma & treated with Chemoradiation  by Dr Marin Olp and she was induced into remission by 05/2015 & had been on maintenance with Rituxan q2 months thru Nov 2018. In Apr 2016 she was dx'd/tx'd for a Rt Leg DVT with Xarelto for 6 months.     Patient has been followed expectantly for years with hx/o labile HTN.  Patient's BP has been controlled  and she denies any cardiac symptoms as chest pain, palpitations, shortness of breath, dizziness or ankle swelling. Today's BP is at goal - 122/68.      Patient's hyperlipidemia is controlled with diet and medications. Patient denies myalgias or other medication SE's. Last lipids were at goal: Lab Results  Component Value Date   CHOL 175 06/29/2018   HDL 65 06/29/2018   LDLCALC 92 06/29/2018   TRIG 85 06/29/2018   CHOLHDL 2.7 06/29/2018      Patient has hx/o prediabetes  (A1c 5.9% / 2012)  and patient denies reactive hypoglycemic symptoms, visual blurring, diabetic polys or paresthesias. Last A1c was Normal & at goal: Lab Results  Component Value Date   HGBA1C 5.6 06/29/2018      Patient has been on Thyroid Replacement since 2013.      Finally, patient has  history of Vitamin D Deficiency and last Vitamin D was near goal; Lab Results  Component Value Date   VD25OH 55 03/29/2018   Current Outpatient Medications on File Prior to Visit  Medication Sig  . acetaminophen (TYLENOL) 325 MG tablet Take 325 mg by mouth every 6 (six) hours as needed for moderate pain (For pain.).   [name] ALPRAZolam (XANAX) 0.5 MG tablet Take 1/2 to 1 tablet 3 x day  as needed for anxiety or sleep. Avoid taking daily, limit to <5 days/week.  [name] b complex vitamins tablet Take by mouth.  . Cholecalciferol (VITAMIN D3) 5000 units CAPS Take 10,000 Units by mouth daily.  . hyoscyamine (LEVSIN, ANASPAZ) 0.125 MG tablet Take 1 to 2 tablets every 4 hours as needed for nausea, cramping, bloating or diarrhea (Patient taking differently: Take 0.125 mg by mouth every 4 (four) hours as needed for cramping. )  . ipratropium-albuterol (DUONEB) 0.5-2.5 (3) MG/3ML SOLN Take 3 mLs by nebulization every 4 (four) hours as needed (heaviness in chest).   [name] levothyroxine (SYNTHROID, LEVOTHROID) 50 MCG tablet TAKE 1 TABLET BY MOUTH ONCE DAILY.  [name] lidocaine-prilocaine (EMLA) cream APPLY TO PAC 1 HOUR BEFORE ACCESS.  [name] PROAIR HFA 108 (90 Base) MCG/ACT inhaler INHALE 2 PUFFS EVERY 4 HOURS AS NEEDED INTO THE LUNGS FOR WHEEZING OR SHORTNESS OF BREATH.  . Probiotic Product (PROBIOTIC DAILY PO) Take 1 capsule by mouth daily.  . promethazine-dextromethorphan (PROMETHAZINE-DM) 6.25-15 MG/5ML syrup Take 1 to 2 tsp enery 4 hours if needed for cough  .  Respiratory Therapy Supplies (FLUTTER) DEVI Take as directed  . rosuvastatin (CRESTOR) 10 MG tablet 1 pill 2 times a week (Patient taking differently: 1/4 tablet every other day)   Current Facility-Administered Medications on File Prior to Visit  Medication  . ipratropium-albuterol (DUONEB) 0.5-2.5 (3) MG/3ML nebulizer solution 3 mL   Allergies  Allergen Reactions  . Alendronate Other (See Comments)    "made me choke"    . Risedronate Sodium Other (See  Comments)    "made me choke"    . Sulfa Antibiotics Other (See Comments)    Doesn't remember   . Sulfasalazine Other (See Comments)    Doesn't remember   . Other Rash  . Tetracyclines & Related Rash   Past Medical History:  Diagnosis Date  . Adenomatous colon polyp 1994  . Asthma   . Cataract 2013   bilateral eyes  . Diverticulosis   . Family history of ischemic heart disease   . Fibrocystic breast disease   . GERD (gastroesophageal reflux disease)   . Hiatal hernia   . Hyperlipidemia   . Hypertension    pt denies - 01/16/18  . IBS (irritable bowel syndrome)   . Internal hemorrhoids   . Mantle cell lymphoma ([place]) 10/18/2014  . Melanoma ([place])    Facial, pt denies on 01/16/18  . OSA (obstructive sleep apnea)   . Osteoarthritis   . Osteoporosis   . Prediabetes   . Unspecified hypothyroidism   . Vitamin D deficiency    Health Maintenance  Topic Date Due  . TETANUS/TDAP  12/20/2018 (Originally 08/17/1960)  . COLONOSCOPY  02/02/2022  . DEXA SCAN  Completed  . PNA vac Low Risk Adult  Completed  . INFLUENZA VACCINE  Discontinued   Immunization History  Administered Date(s) Administered  . Pneumococcal Conjugate-13 03/18/2016  . Pneumococcal Polysaccharide-23 06/18/2013  . Zoster 04/17/2013   Last Colon - 02/02/2017 - recc 5 yr f/u due May 2023  Last MGM - 06/29/2017 - annual overdue  reminded to schedule   Past Surgical History:  Procedure Laterality Date  . APPENDECTOMY    . BUNIONECTOMY    . CATARACT EXTRACTION, BILATERAL    . KNEE ARTHROSCOPY    . TONSILLECTOMY    . UMBILICAL HERNIA REPAIR    . VIDEO BRONCHOSCOPY Bilateral 10/12/2017   Procedure: VIDEO BRONCHOSCOPY WITHOUT FLUORO;  Surgeon: Tanda Rockers, MD;  Location: [facility];  Service: Endoscopy;  Laterality: Bilateral;   Family History  Problem Relation Age of Onset  . Emphysema Father   . Heart disease Father   . Heart attack Father 58       Died  . Heart disease Brother 38  . Rheum arthritis  Sister   . Hypertension Mother   . Thyroid disease Mother   . Colon cancer Neg Hx   . Stomach cancer Neg Hx    Social History   Tobacco Use  . Smoking status: Never Smoker  . Smokeless tobacco: Never Used  . Tobacco comment: Never Used Tobacco  Substance Use Topics  . Alcohol use: No    Alcohol/week: 0.0 standard drinks  . Drug use: No    ROS Constitutional: Denies fever, chills, weight loss/gain, headaches, insomnia,  night sweats, and change in appetite. Does c/o fatigue. Eyes: Denies redness, blurred vision, diplopia, discharge, itchy, watery eyes.  ENT: Denies discharge, congestion, post nasal drip, epistaxis, sore throat, earache, hearing loss, dental pain, Tinnitus, Vertigo, Sinus pain, snoring.  Cardio: Denies chest pain, palpitations, irregular heartbeat, syncope, dyspnea,  diaphoresis, orthopnea, PND, claudication, edema Respiratory: denies cough, dyspnea, DOE, pleurisy, hoarseness, laryngitis, wheezing.  Gastrointestinal: Denies dysphagia, heartburn, reflux, water brash, pain, cramps, nausea, vomiting, bloating, diarrhea, constipation, hematemesis, melena, hematochezia, jaundice, hemorrhoids Genitourinary: Denies dysuria, frequency, urgency, nocturia, hesitancy, discharge, hematuria, flank pain Breast: Breast lumps, nipple discharge, bleeding.  Musculoskeletal: Denies arthralgia, myalgia, stiffness, Jt. Swelling, pain, limp, and strain/sprain. Denies falls. Skin: Denies puritis, rash, hives, warts, acne, eczema, changing in skin lesion Neuro: No weakness, tremor, incoordination, spasms, paresthesia, pain Psychiatric: Denies confusion, memory loss, sensory loss. Denies Depression. Endocrine: Denies change in weight, skin, hair change, nocturia, and paresthesia, diabetic polys, visual blurring, hyper / hypo glycemic episodes.  Heme/Lymph: No excessive bleeding, bruising, enlarged lymph nodes.  Physical Exam  BP 122/68   Pulse 60   Temp (!) 97.4 F (36.3 C)   Ht 5\' 4"   (1.626 m)   Wt 123 lb 12.8 oz (56.2 kg)   LMP 09/14/2014   SpO2 94%   BMI 21.25 kg/m   General Appearance: Well nourished, well groomed and in no apparent distress.  Eyes: PERRLA, EOMs, conjunctiva no swelling or erythema, normal fundi and vessels. Sinuses: No frontal/maxillary tenderness ENT/Mouth: EACs patent / TMs  nl. Nares clear without erythema, swelling, mucoid exudates. Oral hygiene is good. No erythema, swelling, or exudate. Tongue normal, non-obstructing. Tonsils not swollen or erythematous. Hearing normal.  Neck: Supple, thyroid not palpable. No bruits, nodes or JVD. Respiratory: Respiratory effort normal.  BS equal and clear bilateral without rales, rhonci, wheezing or stridor. Cardio: Heart sounds are normal with regular rate and rhythm and no murmurs, rubs or gallops. Peripheral pulses are normal and equal bilaterally without edema. No aortic or femoral bruits. Chest: symmetric with normal excursions and percussion. Breasts: Symmetric, without lumps, nipple discharge, retractions, or fibrocystic changes.  Abdomen: Flat, soft with bowel sounds active. Nontender, no guarding, rebound, hernias, masses, or organomegaly.  Lymphatics: Non tender without lymphadenopathy.  Genitourinary:  Musculoskeletal: Full ROM all peripheral extremities, joint stability, 5/5 strength, and normal gait. Skin: Warm and dry without rashes, lesions, cyanosis, clubbing or  ecchymosis.  Neuro: Cranial nerves intact, reflexes equal bilaterally. Normal muscle tone, no cerebellar symptoms. Sensation intact.  Pysch: Alert and oriented X 3, normal affect, Insight and Judgment appropriate.   Assessment and Plan  1. Annual Preventative Screening Examination  2. Labile hypertension  - EKG 12-Lead - Urinalysis, Routine w reflex microscopic - Microalbumin / creatinine urine ratio - COMPLETE METABOLIC PANEL WITH GFR - Magnesium - TSH - CBC with Differential/Platelet  3. Hyperlipidemia, mixed  - EKG  12-Lead - Lipid panel - TSH  4. Abnormal glucose  - EKG 12-Lead - Hemoglobin A1c - Insulin, random  5. Vitamin D deficiency  - VITAMIN D 25 Hydroxyl  6. Prediabetes  - EKG 12-Lead - Hemoglobin A1c - Insulin, random  7. Hypothyroidism  - TSH  8. Mantle cell lymphoma (HCC)  - CBC with Differential/Platelet  9. Familial tremor   10. Osteoporosis  - COMPLETE METABOLIC PANEL WITH GFR  11. Screening for colorectal cancer  - POC Hemoccult Bld/Stl   12. Screening for ischemic heart disease  - EKG 12-Lead  13. Atherosclerosis of aorta (HCC)  - EKG 12-Lead  14. FHx: heart disease  - EKG 12-Lead  15. Medication management  - Urinalysis, Routine w reflex microscopic - Microalbumin / creatinine urine ratio - COMPLETE METABOLIC PANEL WITH GFR - Magnesium - Lipid panel - TSH - Hemoglobin A1c - Insulin, random - VITAMIN D 25 Hydroxyl -  CBC with Differential/Platelet  16. History of lymphoma         Patient was counseled in prudent diet to achieve/maintain BMI less than 25 for weight control, BP monitoring, regular exercise and medications. Discussed med's effects and SE's. Screening labs and tests as requested with regular follow-up as recommended. Over 40 minutes of exam, counseling, chart review and high complex critical decision making was performed.

## 2018-10-04 NOTE — Patient Instructions (Signed)

## 2018-10-05 LAB — CBC WITH DIFFERENTIAL/PLATELET
Absolute Monocytes: 712 cells/uL (ref 200–950)
BASOS PCT: 1.5 %
Basophils Absolute: 78 cells/uL (ref 0–200)
Eosinophils Absolute: 260 cells/uL (ref 15–500)
Eosinophils Relative: 5 %
HCT: 40.2 % (ref 35.0–45.0)
Hemoglobin: 13.5 g/dL (ref 11.7–15.5)
Lymphs Abs: 1420 cells/uL (ref 850–3900)
MCH: 30 pg (ref 27.0–33.0)
MCHC: 33.6 g/dL (ref 32.0–36.0)
MCV: 89.3 fL (ref 80.0–100.0)
MPV: 10 fL (ref 7.5–12.5)
Monocytes Relative: 13.7 %
Neutro Abs: 2730 cells/uL (ref 1500–7800)
Neutrophils Relative %: 52.5 %
Platelets: 225 10*3/uL (ref 140–400)
RBC: 4.5 10*6/uL (ref 3.80–5.10)
RDW: 12.9 % (ref 11.0–15.0)
Total Lymphocyte: 27.3 %
WBC: 5.2 10*3/uL (ref 3.8–10.8)

## 2018-10-05 LAB — LIPID PANEL
Cholesterol: 174 mg/dL (ref <200)
HDL: 62 mg/dL (ref >50)
LDL Cholesterol (Calc): 91 mg/dL (calc)
Non-HDL Cholesterol (Calc): 112 mg/dL (calc) (ref <130)
Total CHOL/HDL Ratio: 2.8 (calc) (ref <5.0)
Triglycerides: 112 mg/dL (ref <150)

## 2018-10-05 LAB — COMPLETE METABOLIC PANEL WITH GFR
AG RATIO: 2.3 (calc) (ref 1.0–2.5)
ALT: 10 U/L (ref 6–29)
AST: 25 U/L (ref 10–35)
Albumin: 4.3 g/dL (ref 3.6–5.1)
Alkaline phosphatase (APISO): 57 U/L (ref 33–130)
BUN: 13 mg/dL (ref 7–25)
CALCIUM: 9.5 mg/dL (ref 8.6–10.4)
CO2: 27 mmol/L (ref 20–32)
Chloride: 99 mmol/L (ref 98–110)
Creat: 0.84 mg/dL (ref 0.60–0.93)
GFR, EST NON AFRICAN AMERICAN: 68 mL/min/{1.73_m2} (ref >=60)
GFR, Est African American: 79 mL/min/{1.73_m2} (ref >=60)
GLOBULIN: 1.9 g/dL (ref 1.9–3.7)
Glucose, Bld: 91 mg/dL (ref 65–99)
POTASSIUM: 5 mmol/L (ref 3.5–5.3)
Sodium: 135 mmol/L (ref 135–146)
Total Bilirubin: 0.5 mg/dL (ref 0.2–1.2)
Total Protein: 6.2 g/dL (ref 6.1–8.1)

## 2018-10-05 LAB — MAGNESIUM: Magnesium: 1.8 mg/dL (ref 1.5–2.5)

## 2018-10-05 LAB — URINALYSIS, ROUTINE W REFLEX MICROSCOPIC
Bilirubin Urine: NEGATIVE
Glucose, UA: NEGATIVE
Hgb urine dipstick: NEGATIVE
KETONES UR: NEGATIVE
Leukocytes, UA: NEGATIVE
Nitrite: NEGATIVE
Protein, ur: NEGATIVE
Specific Gravity, Urine: 1.004 (ref 1.001–1.03)
pH: 7.5 (ref 5.0–8.0)

## 2018-10-05 LAB — MICROALBUMIN / CREATININE URINE RATIO
Creatinine, Urine: 19 mg/dL — ABNORMAL LOW (ref 20–275)
Microalb Creat Ratio: 32 mcg/mg creat — ABNORMAL HIGH (ref <30)
Microalb, Ur: 0.6 mg/dL

## 2018-10-05 LAB — HEMOGLOBIN A1C
Hgb A1c MFr Bld: 5.5 % of total Hgb (ref <5.7)
Mean Plasma Glucose: 111 (calc)
eAG (mmol/L): 6.2 (calc)

## 2018-10-05 LAB — INSULIN, RANDOM: INSULIN: 1.2 u[IU]/mL — AB (ref 2.0–19.6)

## 2018-10-05 LAB — TSH: TSH: 2.46 mIU/L (ref 0.40–4.50)

## 2018-10-05 LAB — VITAMIN D 25 HYDROXY (VIT D DEFICIENCY, FRACTURES): Vit D, 25-Hydroxy: 83 ng/mL (ref 30–100)

## 2018-11-01 ENCOUNTER — Other Ambulatory Visit: Payer: Self-pay | Admitting: *Deleted

## 2018-11-01 DIAGNOSIS — Z1212 Encounter for screening for malignant neoplasm of rectum: Principal | ICD-10-CM

## 2018-11-01 DIAGNOSIS — Z1211 Encounter for screening for malignant neoplasm of colon: Secondary | ICD-10-CM | POA: Diagnosis not present

## 2018-11-01 LAB — POC HEMOCCULT BLD/STL (HOME/3-CARD/SCREEN)
Card #2 Fecal Occult Blod, POC: NEGATIVE
FECAL OCCULT BLD: NEGATIVE
Fecal Occult Blood, POC: NEGATIVE

## 2018-11-24 ENCOUNTER — Encounter: Payer: Self-pay | Admitting: Hematology & Oncology

## 2018-11-24 ENCOUNTER — Other Ambulatory Visit: Payer: Self-pay

## 2018-11-24 ENCOUNTER — Inpatient Hospital Stay: Payer: PPO | Attending: Hematology & Oncology | Admitting: Hematology & Oncology

## 2018-11-24 ENCOUNTER — Inpatient Hospital Stay: Payer: PPO

## 2018-11-24 VITALS — BP 128/58 | HR 64 | Temp 98.2°F | Resp 17 | Wt 122.0 lb

## 2018-11-24 DIAGNOSIS — C8310 Mantle cell lymphoma, unspecified site: Secondary | ICD-10-CM

## 2018-11-24 DIAGNOSIS — Z9221 Personal history of antineoplastic chemotherapy: Secondary | ICD-10-CM | POA: Diagnosis not present

## 2018-11-24 DIAGNOSIS — Z79899 Other long term (current) drug therapy: Secondary | ICD-10-CM

## 2018-11-24 DIAGNOSIS — Z95828 Presence of other vascular implants and grafts: Secondary | ICD-10-CM

## 2018-11-24 DIAGNOSIS — D801 Nonfamilial hypogammaglobulinemia: Secondary | ICD-10-CM

## 2018-11-24 DIAGNOSIS — J479 Bronchiectasis, uncomplicated: Secondary | ICD-10-CM

## 2018-11-24 LAB — CBC WITH DIFFERENTIAL (CANCER CENTER ONLY)
Abs Immature Granulocytes: 0.02 10*3/uL (ref 0.00–0.07)
Basophils Absolute: 0.1 10*3/uL (ref 0.0–0.1)
Basophils Relative: 1 %
Eosinophils Absolute: 0.2 10*3/uL (ref 0.0–0.5)
Eosinophils Relative: 3 %
HCT: 41.3 % (ref 36.0–46.0)
Hemoglobin: 13.5 g/dL (ref 12.0–15.0)
IMMATURE GRANULOCYTES: 0 %
Lymphocytes Relative: 24 %
Lymphs Abs: 1.4 10*3/uL (ref 0.7–4.0)
MCH: 29.9 pg (ref 26.0–34.0)
MCHC: 32.7 g/dL (ref 30.0–36.0)
MCV: 91.6 fL (ref 80.0–100.0)
Monocytes Absolute: 0.7 10*3/uL (ref 0.1–1.0)
Monocytes Relative: 12 %
NEUTROS PCT: 60 %
Neutro Abs: 3.7 10*3/uL (ref 1.7–7.7)
Platelet Count: 194 10*3/uL (ref 150–400)
RBC: 4.51 MIL/uL (ref 3.87–5.11)
RDW: 13.4 % (ref 11.5–15.5)
WBC: 6 10*3/uL (ref 4.0–10.5)
nRBC: 0 % (ref 0.0–0.2)

## 2018-11-24 LAB — CMP (CANCER CENTER ONLY)
ALT: 13 U/L (ref 0–44)
AST: 25 U/L (ref 15–41)
Albumin: 4.5 g/dL (ref 3.5–5.0)
Alkaline Phosphatase: 62 U/L (ref 38–126)
Anion gap: 8 (ref 5–15)
BUN: 17 mg/dL (ref 8–23)
CO2: 27 mmol/L (ref 22–32)
Calcium: 9.7 mg/dL (ref 8.9–10.3)
Chloride: 102 mmol/L (ref 98–111)
Creatinine: 1.03 mg/dL — ABNORMAL HIGH (ref 0.44–1.00)
GFR, Est AFR Am: >60 mL/min (ref >60)
GFR, Estimated: 53 mL/min — ABNORMAL LOW (ref >60)
Glucose, Bld: 98 mg/dL (ref 70–99)
POTASSIUM: 4.3 mmol/L (ref 3.5–5.1)
SODIUM: 137 mmol/L (ref 135–145)
Total Bilirubin: 0.5 mg/dL (ref 0.3–1.2)
Total Protein: 6.3 g/dL — ABNORMAL LOW (ref 6.5–8.1)

## 2018-11-24 LAB — LACTATE DEHYDROGENASE: LDH: 206 U/L — ABNORMAL HIGH (ref 98–192)

## 2018-11-24 MED ORDER — HEPARIN SOD (PORK) LOCK FLUSH 100 UNIT/ML IV SOLN
500.0000 [IU] | Freq: Once | INTRAVENOUS | Status: AC
  Administered 2018-11-24: 500 [IU] via INTRAVENOUS
  Filled 2018-11-24: qty 5

## 2018-11-24 MED ORDER — SODIUM CHLORIDE 0.9% FLUSH
10.0000 mL | INTRAVENOUS | Status: DC | PRN
  Administered 2018-11-24: 10 mL via INTRAVENOUS
  Filled 2018-11-24: qty 10

## 2018-11-24 NOTE — Progress Notes (Signed)
Hematology and Oncology Follow Up Visit  Shannon Parsons [id number] 1941-05-24 78 y.o. 11/24/2018   Principle Diagnosis:   Mantle cell lymphoma (MCIPI = 5)  Thromboembolic event of the right leg  Acquired Hypogammaglobulinemia  Bronchiectasis  Current Therapy:    Rituxan - maintanence - s/p cycle #10 - to finish in 07/2017  IVIG - q  month -restart on 10/27/2017; last dose 01/05/2018     Interim History:  [name] is back for follow-up.  She is doing pretty well.  She still has this tremor.  I am not sure what is going on with the tremor.  She still has her Port-A-Cath.  We flushed the Port-A-Cath every couple months.  She has had no lung issues.  She did have some bronchiectasis last year.  We did give her some IVIG.  This did seem to help.  She is had no fever.  There is been no change in bowel or bladder habits.  She has had no rashes.  There is been no leg swelling.  She is not noted any swollen lymph nodes.  She is had no headache.  Overall, her performance status is ECOG 1.   Medications:  Current Outpatient Medications:  .  acetaminophen (TYLENOL) 325 MG tablet, Take 325 mg by mouth every 6 (six) hours as needed for moderate pain (For pain.). , Disp: , Rfl:  .  ALPRAZolam (XANAX) 0.5 MG tablet, Take 1/2 to 1 tablet 3 x day  as needed for anxiety or sleep. Avoid taking daily, limit to <5 days/week., Disp: 90 tablet, Rfl: 0 .  b complex vitamins tablet, Take by mouth., Disp: , Rfl:  .  Cholecalciferol (VITAMIN D3) 5000 units CAPS, Take 10,000 Units by mouth daily., Disp: , Rfl:  .  hyoscyamine (LEVSIN, ANASPAZ) 0.125 MG tablet, Take 1 to 2 tablets every 4 hours as needed for nausea, cramping, bloating or diarrhea (Patient taking differently: Take 0.125 mg by mouth every 4 (four) hours as needed for cramping. ), Disp: 90 tablet, Rfl: 11 .  ipratropium-albuterol (DUONEB) 0.5-2.5 (3) MG/3ML SOLN, Take 3 mLs by nebulization every 4 (four) hours as needed (heaviness  in chest). , Disp: , Rfl:  .  levothyroxine (SYNTHROID, LEVOTHROID) 50 MCG tablet, TAKE 1 TABLET BY MOUTH ONCE DAILY., Disp: 90 tablet, Rfl: 3 .  lidocaine-prilocaine (EMLA) cream, APPLY TO PAC 1 HOUR BEFORE ACCESS., Disp: 30 g, Rfl: 3 .  PROAIR HFA 108 (90 Base) MCG/ACT inhaler, INHALE 2 PUFFS EVERY 4 HOURS AS NEEDED INTO THE LUNGS FOR WHEEZING OR SHORTNESS OF BREATH., Disp: 48 g, Rfl: 1 .  Probiotic Product (PROBIOTIC DAILY PO), Take 1 capsule by mouth daily., Disp: , Rfl:  .  promethazine-dextromethorphan (PROMETHAZINE-DM) 6.25-15 MG/5ML syrup, Take 1 to 2 tsp enery 4 hours if needed for cough, Disp: 360 mL, Rfl: 0 .  Respiratory Therapy Supplies (FLUTTER) DEVI, Take as directed, Disp: 1 each, Rfl: 0 .  rosuvastatin (CRESTOR) 10 MG tablet, 1 pill 2 times a week (Patient taking differently: 1/4 tablet every other day), Disp: 30 tablet, Rfl: 3 No current facility-administered medications for this visit.   Facility-Administered Medications Ordered in Other Visits:  .  ipratropium-albuterol (DUONEB) 0.5-2.5 (3) MG/3ML nebulizer solution 3 mL, 3 mL, Nebulization, Q6H, [place], Holli Humbles, NP  Allergies:  Allergies  Allergen Reactions  . Alendronate Other (See Comments)    "made me choke"    . Risedronate Sodium Other (See Comments)    "made me choke"    .  Sulfa Antibiotics Other (See Comments)    Doesn't remember   . Sulfasalazine Other (See Comments)    Doesn't remember   . Other Rash  . Tetracyclines & Related Rash    Past Medical History, Surgical history, Social history, and Family History were reviewed and updated.  Review of Systems: Review of Systems  Constitutional: Positive for malaise/fatigue.  HENT: Positive for congestion.   Eyes: Negative.   Respiratory: Positive for shortness of breath and wheezing.   Cardiovascular: Negative.   Gastrointestinal: Negative.   Genitourinary: Negative.   Musculoskeletal: Negative.   Skin: Negative.   Neurological: Negative.    Endo/Heme/Allergies: Negative.   Psychiatric/Behavioral: Negative.     Physical Exam:  weight is 122 lb (55.3 kg). Her oral temperature is 98.2 F (36.8 C). Her blood pressure is 128/58 (abnormal) and her pulse is 64. Her respiration is 17 and oxygen saturation is 99%.   Physical Exam Vitals signs reviewed.  HENT:     Head: Normocephalic and atraumatic.  Eyes:     Pupils: Pupils are equal, round, and reactive to light.  Neck:     Musculoskeletal: Normal range of motion.  Cardiovascular:     Rate and Rhythm: Normal rate and regular rhythm.     Heart sounds: Normal heart sounds.  Pulmonary:     Effort: Pulmonary effort is normal.     Breath sounds: Normal breath sounds.  Abdominal:     General: Bowel sounds are normal.     Palpations: Abdomen is soft.  Musculoskeletal: Normal range of motion.        General: No tenderness or deformity.  Lymphadenopathy:     Cervical: No cervical adenopathy.  Skin:    General: Skin is warm and dry.     Findings: No erythema or rash.  Neurological:     Mental Status: She is alert and oriented to person, place, and time.  Psychiatric:        Behavior: Behavior normal.        Thought Content: Thought content normal.        Judgment: Judgment normal.      Lab Results  Component Value Date   WBC 6.0 11/24/2018   HGB 13.5 11/24/2018   HCT 41.3 11/24/2018   MCV 91.6 11/24/2018   PLT 194 11/24/2018     Chemistry      Component Value Date/Time   NA 137 11/24/2018 1205   NA 141 07/13/2017 0743   NA 138 08/01/2015 1124   K 4.3 11/24/2018 1205   K 4.2 07/13/2017 0743   K 4.0 08/01/2015 1124   CL 102 11/24/2018 1205   CL 105 07/13/2017 0743   CO2 27 11/24/2018 1205   CO2 27 07/13/2017 0743   CO2 28 08/01/2015 1124   BUN 17 11/24/2018 1205   BUN 16 07/13/2017 0743   BUN 10.4 08/01/2015 1124   CREATININE 1.03 (H) 11/24/2018 1205   CREATININE 0.84 10/04/2018 1053   CREATININE 0.8 08/01/2015 1124      Component Value Date/Time    CALCIUM 9.7 11/24/2018 1205   CALCIUM 9.5 07/13/2017 0743   CALCIUM 9.6 08/01/2015 1124   ALKPHOS 62 11/24/2018 1205   ALKPHOS 68 07/13/2017 0743   ALKPHOS 88 08/01/2015 1124   AST 25 11/24/2018 1205   AST 23 08/01/2015 1124   ALT 13 11/24/2018 1205   ALT 19 07/13/2017 0743   ALT 12 08/01/2015 1124   BILITOT 0.5 11/24/2018 1205   BILITOT <0.30 08/01/2015 1124  Impression and Plan: Shannon Parsons is 78 year old white female. She has mantle cell lymphoma. Again, by the [facility] she is an intermediate risk.  She received 2 different courses of chemotherapy. She could not tolerate R-CHOP with Velcade substituting for vincristine. This however was incredibly helpful. We finished up with Rituxan/bendamustine. She completed this in August 2016.  We then started maintenance Rituxan. She started this in November 2016.  She completed this in November 2018.    For right now, we can just follow her along.  We do have to see her every couple months to flush her Port-A-Cath.  I think I can probably see her back in 4 months.  I do not see need for a PET scan at the present time.   Volanda Napoleon, MD 3/13/20201:41 PM

## 2019-01-11 NOTE — Progress Notes (Addendum)
MEDICARE ANNUAL WELLNESS VISIT   THIS ENCOUNTER IS A VIRTUAL/TELEVIDEO VISIT DUE TO COVID-19 - PATIENT WAS NOT SEEN IN THE OFFICE.  PATIENT HAS CONSENTED TO VIRTUAL VISIT / TELEVIDEO VISIT  This provider placed a call to Shannon Parsons, her appointment was changed to a virtual office visit to reduce the risk of exposure to the COVID-19 virus and to help Shannon Parsons remain healthy and safe. The virtual visit will also provide continuity of care. She verbalizes understanding.   Assessment:    Essential hypertension - continue medications, DASH diet, exercise and monitor at home. Call if greater than 130/80.   Chronic obstructive airway disease with asthma ([place]) Continue follow up pulmonary  OSA (obstructive sleep apnea) May need auto titration CPAP  Obstructive bronchiectasis ([place]) Continue follow up pulmonary  History of DVT Likely due to chemo/cancer, will monitor for signs, stay on bASA  Mantle cell lymphoma of intra-abdominal lymph nodes ([place]) Continue follow up oncology  GERD Continue PPI/H2 blocker, diet discussed  Irritable bowel syndrome, unspecified type If not on benefiber then add it, decrease stress,  if any worsening symptoms, blood in stool, AB pain, etc call office  Diverticulosis of intestine without bleeding, unspecified intestinal tract location Increase fiber, if any worsening symptoms, blood in stool, AB pain, etc call office  Osteoporosis, unspecified osteoporosis type, unspecified pathological fracture presence Continue supplement  Osteoarthritis, unspecified osteoarthritis type, unspecified site monitor  Anxiety state continue medications, stress management techniques discussed, increase water, good sleep hygiene discussed, increase exercise, and increase veggies.   History of colonic polyps UTD  Mixed hyperlipidemia decrease fatty foods increase activity.   Vitamin D deficiency  Normocytic anemia -     monitor  Acquired  hypogammaglobulinemia ([place]) Continue follow up oncology  Encounter for Medicare annual wellness exam 1 year  BMI 21.0-21.9, adult Monitor  Atherosclerosis of aorta ([facility]) Control blood pressure, cholesterol, glucose, increase exercise.   Sinusitis, maxillary, chronic Follow up ENT  Essential tremor No worsening symptoms   Over 30 minutes of counseling, chart review and critical decision making was performed Future Appointments  Date Time Provider [place]  01/26/2019 11:30 AM [facility] INJ NURSE [facility] None  03/30/2019 11:45 AM [facility] INJ NURSE [facility] None  03/30/2019 12:00 PM [facility] LAB [facility] None  03/30/2019 12:30 PM [place], Rudell Cobb, MD [facility] None  04/17/2019 10:30 AM Unk Pinto, MD [facility] None  10/29/2019 10:00 AM Unk Pinto, MD [facility] None    Plan:   During the course of the visit the patient was educated and counseled about appropriate screening and preventive services including:    Pneumococcal vaccine   Prevnar 13  Influenza vaccine  Td vaccine  Screening electrocardiogram  Bone densitometry screening  Colorectal cancer screening  Diabetes screening  Glaucoma screening  Nutrition counseling   Advanced directives: requested   Subjective:  Shannon Parsons is a 78 y.o. female who presents for Medicare Annual Wellness Visit and follow up.    She has been having more stomach issues. She has a lot of gas, she has some cramping pain after a BM, will last 2-4 hours and she will get fecal leakage PRN, has had pelvic PT in the past and will do exercises for that.  She will go daily but sometimes will have difficulty with goes.  She states she was constipated for a week and felt a fullness in her rectum.  No blood in the stool. No pencil thin stools. Her last colonoscopy was 2018, EGD 2015.  Her blood pressure has been controlled at home, today their BP is   She does workout. She denies chest pain, shortness of breath,  dizziness.   She has a history of Mantle cell lymphoma, thromboembolic right leg due to cancer/chemo that is resolved, and bronchiectasis and acquired hypogammaglobulinemia, she continues to follow with Dr. Marin Olp, and is doing very well.  She is on CPAP, has chronic cough/sinusitis following with Dr. Melvyn Novas and Constance Holster and has obstructive bronchiectasis, she states it is getting better.   She is not on cholesterol medication and denies myalgias. Her cholesterol is not at goal. The cholesterol last visit was:   Lab Results  Component Value Date   CHOL 174 10/04/2018   HDL 62 10/04/2018   LDLCALC 91 10/04/2018   TRIG 112 10/04/2018   CHOLHDL 2.8 10/04/2018   Last A1C Lab Results  Component Value Date   HGBA1C 5.5 10/04/2018   Last GFR: Lab Results  Component Value Date   GFRNONAA 53 (L) 11/24/2018   Patient is on Vitamin D supplement.   Lab Results  Component Value Date   VD25OH 18 10/04/2018     She is on thyroid medication. Her medication was not changed last visit.   Lab Results  Component Value Date   TSH 2.46 10/04/2018   BMI is Body mass index is 21.11 kg/m., she is working on diet and exercise. Wt Readings from Last 3 Encounters:  01/12/19 123 lb (55.8 kg)  11/24/18 122 lb (55.3 kg)  10/04/18 123 lb 12.8 oz (56.2 kg)     Medication Review: Current Outpatient Medications on File Prior to Visit  Medication Sig Dispense Refill  . acetaminophen (TYLENOL) 325 MG tablet Take 325 mg by mouth every 6 (six) hours as needed for moderate pain (For pain.).     [name] b complex vitamins tablet Take by mouth.    . Cholecalciferol (VITAMIN D3) 5000 units CAPS Take 10,000 Units by mouth daily.    . hyoscyamine (LEVSIN, ANASPAZ) 0.125 MG tablet Take 1 to 2 tablets every 4 hours as needed for nausea, cramping, bloating or diarrhea (Patient taking differently: Take 0.125 mg by mouth every 4 (four) hours as needed for cramping. ) 90 tablet 11  . ipratropium-albuterol (DUONEB) 0.5-2.5 (3)  MG/3ML SOLN Take 3 mLs by nebulization every 4 (four) hours as needed (heaviness in chest).     [name] levothyroxine (SYNTHROID, LEVOTHROID) 50 MCG tablet TAKE 1 TABLET BY MOUTH ONCE DAILY. 90 tablet 3  . lidocaine-prilocaine (EMLA) cream APPLY TO PAC 1 HOUR BEFORE ACCESS. 30 g 3  . PROAIR HFA 108 (90 Base) MCG/ACT inhaler INHALE 2 PUFFS EVERY 4 HOURS AS NEEDED INTO THE LUNGS FOR WHEEZING OR SHORTNESS OF BREATH. 48 g 1  . Probiotic Product (PROBIOTIC DAILY PO) Take 1 capsule by mouth daily.    . promethazine-dextromethorphan (PROMETHAZINE-DM) 6.25-15 MG/5ML syrup Take 1 to 2 tsp enery 4 hours if needed for cough 360 mL 0  . Respiratory Therapy Supplies (FLUTTER) DEVI Take as directed 1 each 0  . rosuvastatin (CRESTOR) 10 MG tablet 1 pill 2 times a week (Patient taking differently: 1/4 tablet every other day) 30 tablet 3   Current Facility-Administered Medications on File Prior to Visit  Medication Dose Route Frequency Provider Last Rate Last Dose  . ipratropium-albuterol (DUONEB) 0.5-2.5 (3) MG/3ML nebulizer solution 3 mL  3 mL Nebulization Q6H [place], Holli Humbles, NP        Allergies  Allergen Reactions  . Alendronate Other (See Comments)    "  made me choke"    . Risedronate Sodium Other (See Comments)    "made me choke"    . Sulfa Antibiotics Other (See Comments)    Doesn't remember   . Sulfasalazine Other (See Comments)    Doesn't remember   . Other Rash  . Tetracyclines & Related Rash    Current Problems (verified) Patient Active Problem List   Diagnosis Date Noted  . Hypothyroidism 10/04/2018  . Abnormal glucose 10/04/2018  . Labile hypertension 10/04/2018  . Cervical dystonia 01/16/2018  . Sinusitis, maxillary, chronic 10/19/2017  . Atherosclerosis of aorta ([place]) 08/09/2017  . Tremor 05/06/2016  . Acquired hypogammaglobulinemia ([place]) 04/05/2016  . Obstructive bronchiectasis ([place]) 03/18/2016  . Body mass index (BMI) of 20.0-20.9 in adult 07/21/2015  . History of DVT (deep  vein thrombosis)   . Normocytic anemia 01/04/2015  . Mantle cell lymphoma ([place]) 10/18/2014  . Medication management 07/15/2014  . OSA and COPD overlap syndrome ([place]) 07/05/2014  . Anxiety state 02/27/2014  . Hyperlipidemia, mixed   . Hypotension   . GERD   . Osteoporosis  (Refuses treatment)   . Osteoarthritis   . Vitamin D deficiency   . IBS   . Diverticulosis   . History of colonic polyps 07/19/2011  . Chronic obstructive airway disease with asthma ([place]) 03/16/2011    Screening Tests Immunization History  Administered Date(s) Administered  . Pneumococcal Conjugate-13 03/18/2016  . Pneumococcal Polysaccharide-23 06/18/2013  . Zoster 04/17/2013   Preventative care: Last colonoscopy: 2018 EGD 2015 Last mammogram: 06/2017 Last pap smear/pelvic exam: 2010   DEXA:07/2016 Bone marrow exam 2016 Ct sinus 10/2017 MRI brain w/wo 2016- may need repeat Echo 02/2015 Holter 12/2017 Stress test 2015 Sleep study 11/30/2016  Prior vaccinations: TD or Tdap: declined  Influenza: declined Pneumococcal: 2014 Prevnar13: 2017 Shingles/Zostavax: 2014  Names of Other Physician/Practitioners you currently use: 1. [facility] here for primary care 2. [facility], eye doctor, last visit Aug 2019 3. [facility], 2019 Patient Care Team: Unk Pinto, MD as PCP - General (Internal Medicine) Ladene Artist, MD as Consulting Physician (Gastroenterology) Satira Sark, MD as Consulting Physician (Cardiology) Clance, Armando Reichert, MD as Consulting Physician (Pulmonary Disease) Volanda Napoleon, MD as Consulting Physician (Oncology)  SURGICAL HISTORY She  has a past surgical history that includes Appendectomy; Tonsillectomy; Bunionectomy; Knee arthroscopy; Umbilical hernia repair; Video bronchoscopy (Bilateral, 10/12/2017); and Cataract extraction, bilateral. FAMILY HISTORY Her family history includes Emphysema in her father; Heart attack (age of  onset: 35) in her father; Heart disease in her father; Heart disease (age of onset: 68) in her brother; Hypertension in her mother; Rheum arthritis in her sister; Thyroid disease in her mother. SOCIAL HISTORY She  reports that she has never smoked. She has never used smokeless tobacco. She reports that she does not drink alcohol or use drugs.   MEDICARE WELLNESS OBJECTIVES: Physical activity:   Cardiac risk factors:   Depression/mood screen:   Depression screen [facility] 2/9 10/04/2018  Decreased Interest 0  Down, Depressed, Hopeless 0  PHQ - 2 Score 0  Some recent data might be hidden    ADLs:  In your present state of health, do you have any difficulty performing the following activities: 10/04/2018 03/29/2018  Hearing? N N  Vision? N N  Difficulty concentrating or making decisions? N N  Walking or climbing stairs? N N  Dressing or bathing? N N  Doing errands, shopping? N N  Some recent data might be hidden  Cognitive Testing  Alert? Yes  Normal Appearance?Yes  Oriented to person? Yes  Place? Yes   Time? Yes  Recall of three objects?  Yes  Can perform simple calculations? Yes  Displays appropriate judgment?Yes  Can read the correct time from a watch face?Yes  EOL planning:    Review of Systems  Constitutional: Positive for malaise/fatigue. Negative for chills and fever.  HENT: Negative for congestion, sore throat and tinnitus.   Eyes: Negative.   Respiratory: Negative for cough, shortness of breath and wheezing.   Cardiovascular: Negative for chest pain, palpitations and leg swelling.  Gastrointestinal: Negative for blood in stool and melena.  Genitourinary: Negative for hematuria.  Musculoskeletal: Positive for myalgias.  Skin: Negative.   Neurological: Negative for dizziness, sensory change, loss of consciousness and headaches.  Psychiatric/Behavioral: Negative for depression. The patient is not nervous/anxious and does not have insomnia.      Objective:      Today's Vitals   01/12/19 1133  Weight: 123 lb (55.8 kg)  Height: 5\' 4"  (1.626 m)   Body mass index is 21.11 kg/m.  General Appearance:Well sounding, in no apparent distress.  ENT/Mouth: No hoarseness, No cough for duration of visit.  Respiratory: completing full sentences without distress, without audible wheeze Neuro: Awake and oriented X 3,  Psych:  Insight and Judgment appropriate.   Medicare Attestation I have personally reviewed: The patient's medical and social history Their use of alcohol, tobacco or illicit drugs Their current medications and supplements The patient's functional ability including ADLs,fall risks, home safety risks, cognitive, and hearing and visual impairment Diet and physical activities Evidence for depression or mood disorders  The patient's weight, height, BMI, and visual acuity have been recorded in the chart.  I have made referrals, counseling, and provided education to the patient based on review of the above and I have provided the patient with a written personalized care plan for preventive services.     Vicie Mutters, PA-C   01/12/2019

## 2019-01-12 ENCOUNTER — Ambulatory Visit: Payer: PPO | Admitting: Physician Assistant

## 2019-01-12 ENCOUNTER — Other Ambulatory Visit: Payer: Self-pay

## 2019-01-12 ENCOUNTER — Encounter: Payer: Self-pay | Admitting: Physician Assistant

## 2019-01-12 VITALS — Ht 64.0 in | Wt 123.0 lb

## 2019-01-12 DIAGNOSIS — J320 Chronic maxillary sinusitis: Secondary | ICD-10-CM

## 2019-01-12 DIAGNOSIS — G243 Spasmodic torticollis: Secondary | ICD-10-CM

## 2019-01-12 DIAGNOSIS — M1990 Unspecified osteoarthritis, unspecified site: Secondary | ICD-10-CM

## 2019-01-12 DIAGNOSIS — I959 Hypotension, unspecified: Secondary | ICD-10-CM

## 2019-01-12 DIAGNOSIS — R0989 Other specified symptoms and signs involving the circulatory and respiratory systems: Secondary | ICD-10-CM

## 2019-01-12 DIAGNOSIS — E039 Hypothyroidism, unspecified: Secondary | ICD-10-CM | POA: Diagnosis not present

## 2019-01-12 DIAGNOSIS — K589 Irritable bowel syndrome without diarrhea: Secondary | ICD-10-CM

## 2019-01-12 DIAGNOSIS — E559 Vitamin D deficiency, unspecified: Secondary | ICD-10-CM | POA: Diagnosis not present

## 2019-01-12 DIAGNOSIS — R7309 Other abnormal glucose: Secondary | ICD-10-CM

## 2019-01-12 DIAGNOSIS — K2100 Gastro-esophageal reflux disease with esophagitis, without bleeding: Secondary | ICD-10-CM

## 2019-01-12 DIAGNOSIS — D801 Nonfamilial hypogammaglobulinemia: Secondary | ICD-10-CM

## 2019-01-12 DIAGNOSIS — K5790 Diverticulosis of intestine, part unspecified, without perforation or abscess without bleeding: Secondary | ICD-10-CM

## 2019-01-12 DIAGNOSIS — J449 Chronic obstructive pulmonary disease, unspecified: Secondary | ICD-10-CM

## 2019-01-12 DIAGNOSIS — C8310 Mantle cell lymphoma, unspecified site: Secondary | ICD-10-CM | POA: Diagnosis not present

## 2019-01-12 DIAGNOSIS — F411 Generalized anxiety disorder: Secondary | ICD-10-CM

## 2019-01-12 DIAGNOSIS — Z86718 Personal history of other venous thrombosis and embolism: Secondary | ICD-10-CM

## 2019-01-12 DIAGNOSIS — G4733 Obstructive sleep apnea (adult) (pediatric): Secondary | ICD-10-CM

## 2019-01-12 DIAGNOSIS — J479 Bronchiectasis, uncomplicated: Secondary | ICD-10-CM

## 2019-01-12 DIAGNOSIS — Z0001 Encounter for general adult medical examination with abnormal findings: Secondary | ICD-10-CM

## 2019-01-12 DIAGNOSIS — R6889 Other general symptoms and signs: Secondary | ICD-10-CM

## 2019-01-12 DIAGNOSIS — E782 Mixed hyperlipidemia: Secondary | ICD-10-CM

## 2019-01-12 DIAGNOSIS — D649 Anemia, unspecified: Secondary | ICD-10-CM | POA: Diagnosis not present

## 2019-01-12 DIAGNOSIS — I700 Atherosclerosis of aorta: Secondary | ICD-10-CM

## 2019-01-12 DIAGNOSIS — R251 Tremor, unspecified: Secondary | ICD-10-CM

## 2019-01-12 DIAGNOSIS — Z86010 Personal history of colonic polyps: Secondary | ICD-10-CM

## 2019-01-12 DIAGNOSIS — M810 Age-related osteoporosis without current pathological fracture: Secondary | ICD-10-CM

## 2019-01-12 DIAGNOSIS — Z79899 Other long term (current) drug therapy: Secondary | ICD-10-CM | POA: Diagnosis not present

## 2019-01-12 MED ORDER — POLYETHYLENE GLYCOL 3350 17 G PO PACK: 17.0000 g | PACK | Freq: Every day | ORAL | 3 refills | Status: DC

## 2019-01-26 ENCOUNTER — Inpatient Hospital Stay: Payer: PPO | Attending: Hematology & Oncology

## 2019-01-26 ENCOUNTER — Other Ambulatory Visit: Payer: Self-pay

## 2019-01-26 DIAGNOSIS — Z9221 Personal history of antineoplastic chemotherapy: Secondary | ICD-10-CM | POA: Diagnosis not present

## 2019-01-26 DIAGNOSIS — C8310 Mantle cell lymphoma, unspecified site: Secondary | ICD-10-CM | POA: Insufficient documentation

## 2019-01-26 DIAGNOSIS — Z9225 Personal history of immunosupression therapy: Secondary | ICD-10-CM | POA: Insufficient documentation

## 2019-01-26 NOTE — Patient Instructions (Signed)

## 2019-02-08 ENCOUNTER — Other Ambulatory Visit: Payer: Self-pay | Admitting: Hematology & Oncology

## 2019-02-08 DIAGNOSIS — C8313 Mantle cell lymphoma, intra-abdominal lymph nodes: Secondary | ICD-10-CM

## 2019-02-08 DIAGNOSIS — L502 Urticaria due to cold and heat: Secondary | ICD-10-CM

## 2019-03-05 ENCOUNTER — Other Ambulatory Visit: Payer: Self-pay | Admitting: Physician Assistant

## 2019-03-06 NOTE — Progress Notes (Signed)
Assessment & Plan:   Neck pain/thoracic back pain History of osteoprosis, get Xray, rule out compression fracture, likely OA Bone pain, rule out parathyroid/mets -     DG Cervical Spine Complete; Future  Acute midline thoracic back pain -     DG Thoracic Spine W/Swimmers; Future  Anxiety -     CBC with Differential/Platelet -     COMPLETE METABOLIC PANEL WITH GFR -     TSH -     Insulin, random Get on celexa, not addictive, not habit forming Suggest counseling No SI/HI -     citalopram (CELEXA) 10 MG tablet; Take 1 tablet (10 mg total) by mouth daily.  Insomnia, unspecified type -     Insulin, random Highly suggest following up to get back on CPAP or mouth piece to help with insomnia - declines other meds at this time, will try celexa  The patient was advised to call immediately if she has any concerning symptoms in the interval. The patient voices understanding of current treatment options and is in agreement with the current care plan.The patient knows to call the clinic with any problems, questions or concerns or go to the ER if any further progression of symptoms.     Subjective:    Patient ID: Shannon Parsons, female    DOB: Jan 03, 1941, 78 y.o.   MRN: [id number]  HPI 78 y.o. with history of HTN, GERD, preDM, diverticulosis, anxiety, chol and vitamin D, history of DVT right leg in 2016, history of Mantle cell lymphoma following Dr. Marin Olp presents with not feeling well.  No fever, chills, cough, SOB, no symptoms of novel coronavirus and no contacts with anyone with coronavirus.   She states that her anxiety is very high right now. She is short fused. Her husband is very forgetful and demonstrating signs of dementia that frustrate and scare her. She states she will only sleep for 2 hours at a time for months. She has valium and xanax at home, will take occ, took 1/2 valium during the day for anxiety but states she did not like how it made her feel. She states xanax is  helping for sleep.  MRI brain 12/2017 showed New abnormal midbrain signal associated with Parkinson's disease though, may be treatment related given history of lymphoma, less likely Wernicke's. She saw Dr. Carles Collet.  She has a history of sleep apnea but she states that she is not wearing one. Saw Dr. Brett Fairy 07/2018 but did not follow up with the sleep study.  Lab Results  Component Value Date   TSH 2.46 10/04/2018    She is having thoracic back pain, when she lifts heavy mixing bowl she has pain in her mid throacic back and her arms feel weak and she feels she will drop the bowl. No falls but she feels she is getting clumsy.   Blood pressure 130/76, pulse (!) 58, temperature 97.7 F (36.5 C), height 5\' 4"  (1.626 m), weight 124 lb (56.2 kg), last menstrual period 09/14/2014, SpO2 98 %.  Medications Current Outpatient Medications on File Prior to Visit  Medication Sig  . acetaminophen (TYLENOL) 325 MG tablet Take 325 mg by mouth every 6 (six) hours as needed for moderate pain (For pain.).   [name] amoxicillin (AMOXIL) 500 MG capsule TAKE 4 CAPSULES BY MOUTH ONE HOUR BEFORE DENTAL PROCEDURE.  [name] b complex vitamins tablet Take by mouth.  . Cholecalciferol (VITAMIN D3) 5000 units CAPS Take 10,000 Units by mouth daily.  . hyoscyamine (LEVSIN, ANASPAZ) 0.125 MG  tablet Take 1 to 2 tablets every 4 hours as needed for nausea, cramping, bloating or diarrhea (Patient taking differently: Take 0.125 mg by mouth every 4 (four) hours as needed for cramping. )  . ipratropium-albuterol (DUONEB) 0.5-2.5 (3) MG/3ML SOLN Take 3 mLs by nebulization every 4 (four) hours as needed (heaviness in chest).   [name] levothyroxine (SYNTHROID, LEVOTHROID) 50 MCG tablet TAKE 1 TABLET BY MOUTH ONCE DAILY.  [name] lidocaine-prilocaine (EMLA) cream APPLY TO PAC 1 HOUR BEFORE ACCESS.  [name] polyethylene glycol (MIRALAX / GLYCOLAX) 17 g packet Take 17 g by mouth daily.  [name] PROAIR HFA 108 (90 Base) MCG/ACT inhaler INHALE 2 PUFFS EVERY 4 HOURS AS  NEEDED INTO THE LUNGS FOR WHEEZING OR SHORTNESS OF BREATH.  . Probiotic Product (PROBIOTIC DAILY PO) Take 1 capsule by mouth daily.  [name] Respiratory Therapy Supplies (FLUTTER) DEVI Take as directed  . rosuvastatin (CRESTOR) 10 MG tablet 1/4 tablet every other day   Current Facility-Administered Medications on File Prior to Visit  Medication  . ipratropium-albuterol (DUONEB) 0.5-2.5 (3) MG/3ML nebulizer solution 3 mL    Problem list She has Chronic obstructive airway disease with asthma ([place]); History of colonic polyps; Hyperlipidemia, mixed; Hypotension; GERD; Osteoporosis  (Refuses treatment); Osteoarthritis; Vitamin D deficiency; IBS; Diverticulosis; Anxiety state; OSA and COPD overlap syndrome ([place]); Medication management; Mantle cell lymphoma ([place]); Normocytic anemia; History of DVT (deep vein thrombosis); Body mass index (BMI) of 20.0-20.9 in adult; Obstructive bronchiectasis ([place]); Acquired hypogammaglobulinemia ([place]); Atherosclerosis of aorta ([place]); Sinusitis, maxillary, chronic; Cervical dystonia; Tremor; Hypothyroidism; Abnormal glucose; and Labile hypertension on their problem list.   Review of Systems  Constitutional: Positive for fatigue. Negative for chills, diaphoresis and fever.  HENT: Negative for congestion, ear pain, postnasal drip, sinus pressure, sneezing and sore throat.   Respiratory: Negative for apnea, cough, chest tightness, shortness of breath and wheezing.   Cardiovascular: Negative.  Negative for chest pain, palpitations and leg swelling.  Gastrointestinal: Negative.   Genitourinary: Negative.   Musculoskeletal: Positive for back pain and neck pain.  Neurological: Positive for tremors (unchanged and has been evaluated). Negative for dizziness, syncope, facial asymmetry, speech difficulty, weakness, light-headedness, numbness and headaches.  Psychiatric/Behavioral: Positive for decreased concentration and sleep disturbance. Negative for agitation, confusion, dysphoric  mood, hallucinations, self-injury and suicidal ideas. The patient is nervous/anxious.        Objective:   Physical Exam General appearance: alert, no distress, WD/WN, female HEENT: normocephalic, sclerae anicteric, TMs pearly, nares patent, no discharge or erythema, pharynx normal Oral cavity: MMM, no lesions Neck: supple, no lymphadenopathy, no thyromegaly, no masses Heart: RRR, normal S1, S2, no murmurs Lungs: CTA bilaterally, no wheezes, rhonchi, or rales Abdomen: +bs, soft, non tender, non distended, no masses, no hepatomegaly, no splenomegaly Musculoskeletal: + thoracic mid line tenderness, + bilateral trap tenderness, non tender C7, bilaterally lower and upper extremity strength 4/5, no swelling, no obvious deformity Extremities: no edema, no cyanosis, no clubbing Pulses: 2+ symmetric, upper and lower extremities, normal cap refill Neurological: alert, oriented x 3, CN2-12 intact, strength normal upper extremities and lower extremities, sensation normal throughout, DTRs 2+ throughout, no cerebellar signs, gait normal Psychiatric: normal affect, behavior normal, anxious      Vicie Mutters, PA-C

## 2019-03-07 ENCOUNTER — Other Ambulatory Visit: Payer: Self-pay

## 2019-03-07 ENCOUNTER — Encounter: Payer: Self-pay | Admitting: Physician Assistant

## 2019-03-07 ENCOUNTER — Ambulatory Visit (INDEPENDENT_AMBULATORY_CARE_PROVIDER_SITE_OTHER): Payer: PPO | Admitting: Physician Assistant

## 2019-03-07 VITALS — BP 130/76 | HR 58 | Temp 97.7°F | Ht 64.0 in | Wt 124.0 lb

## 2019-03-07 DIAGNOSIS — F419 Anxiety disorder, unspecified: Secondary | ICD-10-CM | POA: Diagnosis not present

## 2019-03-07 DIAGNOSIS — M542 Cervicalgia: Secondary | ICD-10-CM

## 2019-03-07 DIAGNOSIS — G4700 Insomnia, unspecified: Secondary | ICD-10-CM

## 2019-03-07 DIAGNOSIS — M546 Pain in thoracic spine: Secondary | ICD-10-CM

## 2019-03-07 MED ORDER — CITALOPRAM HYDROBROMIDE 10 MG PO TABS: 10.0000 mg | ORAL_TABLET | Freq: Every day | ORAL | 2 refills | Status: DC

## 2019-03-07 NOTE — Patient Instructions (Signed)
INFORMATION ABOUT YOUR XRAY  Can walk into [address] building for an Insurance account manager. They will have the order and take you back. You do not any paper work, I should get the result back today or tomorrow. This order is good for a year.  Can call [phone] to schedule an appointment if you wish.   We are starting you on a new medication. Here is some general information.   1) Medications are not always the solution, any medication we put you on there is always a hope to come off of it depending on the medication. For example, If we start you on a hypertension medication, I would love to get you off of it and we can address that every visit if you wish. I'm always willing to try to get you off a medication unless I really feel that it is beneficial for you.   2) With what I mentioned above, there is no magic pill, I need you to put in the work to get off any medication you wish to not be on. So things to help is move a little each day, drink plenty of water, eat veggies/fruit, and don't smoke.   3) Every medication has a potential for a side effect. Even over the counter medications have a potential side effect. So I start you on a medication and there is something different over the next 1-3 months let me know. It is always possible that it can be the medication.   Here is some information below about your new medication.  If you have any concerns or questions please contact the office and not Dr. Essie Hart. =) Remember also that during a study ANY symptoms someone has can be listed as a side effect even if it was not caused by the medication.

## 2019-03-08 ENCOUNTER — Ambulatory Visit
Admission: RE | Admit: 2019-03-08 | Discharge: 2019-03-08 | Disposition: A | Discharge status: Home / Self Care | Payer: PPO | Source: Ambulatory Visit | Attending: Physician Assistant | Admitting: Physician Assistant

## 2019-03-08 DIAGNOSIS — M542 Cervicalgia: Secondary | ICD-10-CM

## 2019-03-08 DIAGNOSIS — M546 Pain in thoracic spine: Secondary | ICD-10-CM | POA: Diagnosis not present

## 2019-03-08 DIAGNOSIS — M47812 Spondylosis without myelopathy or radiculopathy, cervical region: Secondary | ICD-10-CM | POA: Diagnosis not present

## 2019-03-08 LAB — COMPLETE METABOLIC PANEL WITH GFR
AG Ratio: 2.4 (calc) (ref 1.0–2.5)
ALT: 14 U/L (ref 6–29)
AST: 26 U/L (ref 10–35)
Albumin: 4.4 g/dL (ref 3.6–5.1)
Alkaline phosphatase (APISO): 62 U/L (ref 37–153)
BUN: 15 mg/dL (ref 7–25)
CO2: 29 mmol/L (ref 20–32)
Calcium: 9.9 mg/dL (ref 8.6–10.4)
Chloride: 100 mmol/L (ref 98–110)
Creat: 0.86 mg/dL (ref 0.60–0.93)
GFR, Est African American: 77 mL/min/{1.73_m2} (ref >=60)
GFR, Est Non African American: 66 mL/min/{1.73_m2} (ref >=60)
Globulin: 1.8 g/dL (calc) — ABNORMAL LOW (ref 1.9–3.7)
Glucose, Bld: 81 mg/dL (ref 65–99)
Potassium: 4.9 mmol/L (ref 3.5–5.3)
Sodium: 136 mmol/L (ref 135–146)
Total Bilirubin: 0.5 mg/dL (ref 0.2–1.2)
Total Protein: 6.2 g/dL (ref 6.1–8.1)

## 2019-03-08 LAB — CBC WITH DIFFERENTIAL/PLATELET
Absolute Monocytes: 826 cells/uL (ref 200–950)
Basophils Absolute: 59 cells/uL (ref 0–200)
Basophils Relative: 0.9 %
Eosinophils Absolute: 312 cells/uL (ref 15–500)
Eosinophils Relative: 4.8 %
HCT: 42.2 % (ref 35.0–45.0)
Hemoglobin: 14 g/dL (ref 11.7–15.5)
Lymphs Abs: 1255 cells/uL (ref 850–3900)
MCH: 29.7 pg (ref 27.0–33.0)
MCHC: 33.2 g/dL (ref 32.0–36.0)
MCV: 89.6 fL (ref 80.0–100.0)
MPV: 10.1 fL (ref 7.5–12.5)
Monocytes Relative: 12.7 %
Neutro Abs: 4050 cells/uL (ref 1500–7800)
Neutrophils Relative %: 62.3 %
Platelets: 231 10*3/uL (ref 140–400)
RBC: 4.71 10*6/uL (ref 3.80–5.10)
RDW: 12.4 % (ref 11.0–15.0)
Total Lymphocyte: 19.3 %
WBC: 6.5 10*3/uL (ref 3.8–10.8)

## 2019-03-08 LAB — PTH, INTACT AND CALCIUM
Calcium: 9.9 mg/dL (ref 8.6–10.4)
PTH: 23 pg/mL (ref 14–64)

## 2019-03-08 LAB — TSH: TSH: 2.07 mIU/L (ref 0.40–4.50)

## 2019-03-08 LAB — INSULIN, RANDOM: Insulin: 1.5 u[IU]/mL

## 2019-03-13 ENCOUNTER — Other Ambulatory Visit: Payer: Self-pay | Admitting: Physician Assistant

## 2019-03-13 DIAGNOSIS — M542 Cervicalgia: Secondary | ICD-10-CM

## 2019-03-13 DIAGNOSIS — M546 Pain in thoracic spine: Secondary | ICD-10-CM

## 2019-03-19 ENCOUNTER — Other Ambulatory Visit: Payer: Self-pay | Admitting: Internal Medicine

## 2019-03-19 DIAGNOSIS — N6320 Unspecified lump in the left breast, unspecified quadrant: Secondary | ICD-10-CM

## 2019-03-21 ENCOUNTER — Other Ambulatory Visit: Payer: Self-pay

## 2019-03-21 ENCOUNTER — Encounter: Payer: Self-pay | Admitting: Physical Therapy

## 2019-03-21 ENCOUNTER — Ambulatory Visit: Payer: PPO | Attending: Physician Assistant | Admitting: Physical Therapy

## 2019-03-21 DIAGNOSIS — M542 Cervicalgia: Secondary | ICD-10-CM | POA: Diagnosis not present

## 2019-03-21 DIAGNOSIS — M546 Pain in thoracic spine: Secondary | ICD-10-CM | POA: Insufficient documentation

## 2019-03-21 NOTE — Therapy (Signed)
[place], [place], [address] Phone: [phone]   Fax:  [phone]  Physical Therapy Evaluation  Patient Details  Name: Shannon Parsons MRN: [id number] Date of Birth: [birth year] Referring Provider (PT): Vicie Mutters, [place]   Encounter Date: 03/21/2019  PT End of Session - 03/21/19 1518    Visit Number  1    Number of Visits  6    Date for PT Re-Evaluation  05/02/19    Authorization Type  [employer], progress note by visit 10 and KX at visit 15    PT Start Time  1417    PT Stop Time  1502    PT Time Calculation (min)  45 min    Activity Tolerance  Patient tolerated treatment well    Behavior During Therapy  [facility] for tasks assessed/performed       Past Medical History:  Diagnosis Date  . Adenomatous colon polyp 1994  . Asthma   . Cataract 2013   bilateral eyes  . Diverticulosis   . Family history of ischemic heart disease   . Fibrocystic breast disease   . GERD (gastroesophageal reflux disease)   . Hiatal hernia   . Hyperlipidemia   . Hypertension    pt denies - 01/16/18  . IBS (irritable bowel syndrome)   . Internal hemorrhoids   . Mantle cell lymphoma ([place]) 10/18/2014  . Melanoma ([place])    Facial, pt denies on 01/16/18  . OSA (obstructive sleep apnea)   . Osteoarthritis   . Osteoporosis   . Prediabetes   . Unspecified hypothyroidism   . Vitamin D deficiency     Past Surgical History:  Procedure Laterality Date  . APPENDECTOMY    . BUNIONECTOMY    . CATARACT EXTRACTION, BILATERAL    . KNEE ARTHROSCOPY    . TONSILLECTOMY    . UMBILICAL HERNIA REPAIR    . VIDEO BRONCHOSCOPY Bilateral 10/12/2017   Procedure: VIDEO BRONCHOSCOPY WITHOUT FLUORO;  Surgeon: Tanda Rockers, MD;  Location: [facility] END[facility];  Service: Endoscopy;  Laterality: Bilateral;    There were no vitals filed for this visit.   Subjective Assessment - 03/21/19 1505    Subjective  Pt. reports approximately  9 month history insidious onset thoracic pain as well as bilateral upper trapezius region discomfort. Symptoms noted primarily with standing with difficulty tolerating prolonged standing for chores such as cooking and doing dishes. Pain and fatigue also noted with reacing activities such as UE use to get groceries from upper shelves. No UE/LE radiating symptoms, parasthesias, or bowel/bladder changes noted.    Pertinent History  osteoporosis, Mantle cell lymphoma (last chemo was in 2016), X-ray findings of thoracolumbar scoliosis and cervical spondylosis.    Limitations  Standing;Lifting;Walking;House hold activities    Patient Stated Goals  Decrease pain, improve standing tolerance and strength, posture    Currently in Pain?  No/denies   no pain at rest in sitting during eval, reports pain 5/10 at worst over past 24 hours in thoracic paraspinals and bilat. upper trapezius described as aching and burning in nature        [facility] PT Assessment - 03/21/19 0001      Assessment   Medical Diagnosis  Acute midline thoracic pain, neck pain    Referring Provider (PT)  Vicie Mutters, PA-C    Onset Date/Surgical Date  06/13/18   estimated per report 9 month history of symptoms   Hand Dominance  Right    Prior Therapy  past PT 2017      Precautions   Precaution Comments  Port-A-Cath on right side, osteoporosis      Restrictions   Weight Bearing Restrictions  No      Balance Screen   Has the patient fallen in the past 6 months  No      [place] residence    Living Arrangements  Spouse/significant other    Type of [place] to enter    Entrance Stairs-Number of Steps  2   from garage with [place]  Two level      Prior Function   Level of [place] with basic ADLs      Cognition   Overall Cognitive Status  Within Functional Limits for tasks assessed      Observation/Other Assessments   Focus on  Therapeutic Outcomes (FOTO)   33% limited      Sensation   Light Touch  Appears Intact      Posture/Postural Control   Posture Comments  mild forward head, increased thoracic kyphosis      ROM / Strength   AROM / PROM / Strength  AROM      AROM   Overall AROM Comments  shoulder AROM grossly WFL bilat.    AROM Assessment Site  Cervical    Cervical Flexion  25    Cervical Extension  38    Cervical - Right Side Bend  42    Cervical - Left Side Bend  40    Cervical - Right Rotation  80    Cervical - Left Rotation  78      Flexibility   Soft Tissue Assessment /Muscle Length  --   tight bilat. upper trapezius     Palpation   Palpation comment  Sore/TTP right>left thoracic paraspinals along iliocostalis line and lower trapezius, tight/sore also bilat. upper trapezius with trigger points                Objective measurements completed on examination: See above findings.      [facility] PT Treatment/Exercise - 03/21/19 0001      Exercises   Exercises  Neck      Neck Exercises: Seated   Other Seated Exercise  HEP instruction and practice upper trapezius stretch, cervical retractions, also instructed HEP theraband row, ext, supine horizontal abduction and corner vs. doorway pec stretch             PT Education - 03/21/19 1517    Education Details  eval findings and symptom etiology, HEP, posture, theracan and tennis ball use for muscle/trigger point release    Person(s) Educated  Patient    Methods  Explanation;Demonstration;Verbal cues;Handout    Comprehension  Verbalized understanding;Returned demonstration          PT Long Term Goals - 03/21/19 1527      PT LONG TERM GOAL #1   Title  Independent with HEP    Baseline  needs HEP    Time  6    Period  Weeks    Status  New    Target Date  05/02/19      PT LONG TERM GOAL #2   Title  Tolerate standing periods at least 20-30 minutes while cooking, doing chores with pain 2/10 or less    Baseline   5/10, difficulty tolerating due to pain    Time  6  Period  Weeks    Status  New    Target Date  05/02/19      PT LONG TERM GOAL #3   Title  Improve FOTO score to 28% or less impairment    Baseline  33% limited    Time  6    Period  Weeks    Status  New    Target Date  05/02/19      PT LONG TERM GOAL #4   Title  Perform reaching activities for grocery shopping and cleaning at home without limitation due to neck/thoracic pain    Baseline  ability limited    Time  6    Period  Weeks    Status  Achieved    Target Date  05/02/19      PT LONG TERM GOAL #5   Title  --    Time  --    Period  --    Status  --    Target Date  --             Plan - 03/21/19 1525    Clinical Impression Statement  Pt. presents with thoracic and cervical/upper trapezius region pain-based on exam findings would suspect due to a combination of muscular/postural weakness with underlying degenerative changes and osteoporosis and associated myofascial pain from muscular tension. Pt. would benefit from PT to help relieve pain and address current associated functional limitations for positional tolerance.    Personal Factors and Comorbidities  Age;Comorbidity 2    Comorbidities  osteoporosis, lymphoma    Examination-Activity Limitations  Carry;Stand;Reach Overhead    Examination-Participation Restrictions  Community Activity;Cleaning;Meal Prep    Stability/Clinical Decision Making  Stable/Uncomplicated    Clinical Decision Making  Low    PT Frequency  --   6 visits   PT Duration  6 weeks    PT Treatment/Interventions  ADLs/Self Care Home Management;Moist Heat;Therapeutic activities;Therapeutic exercise;Neuromuscular re-education;Manual techniques;Dry needling;Taping;Patient/family education    PT Next Visit Plan  Review HEP as needed, progress postural strengthenin and stretches, STM upper trapezius and thoracic paraspinals (wishes to defer dry needling for now), MHP prn    PT Home Exercise Plan   Upper trap stretch, cervical retractions, theraband row, extension, supine horiz. abduction, corner vs. doorway pec stretch, Theracane vs. tennis ball use for muscle release    Consulted and Agree with Plan of Care  Patient       Patient will benefit from skilled therapeutic intervention in order to improve the following deficits and impairments:  Pain, Impaired UE functional use, Increased muscle spasms, Decreased activity tolerance, Decreased strength, Postural dysfunction  Visit Diagnosis: 1. Pain in thoracic spine   2. Cervicalgia        Problem List Patient Active Problem List   Diagnosis Date Noted  . Hypothyroidism 10/04/2018  . Abnormal glucose 10/04/2018  . Labile hypertension 10/04/2018  . Cervical dystonia 01/16/2018  . Sinusitis, maxillary, chronic 10/19/2017  . Atherosclerosis of aorta ([place]) 08/09/2017  . Tremor 05/06/2016  . Acquired hypogammaglobulinemia ([place]) 04/05/2016  . Obstructive bronchiectasis ([place]) 03/18/2016  . Body mass index (BMI) of 20.0-20.9 in adult 07/21/2015  . History of DVT (deep vein thrombosis)   . Normocytic anemia 01/04/2015  . Mantle cell lymphoma ([place]) 10/18/2014  . Medication management 07/15/2014  . OSA and COPD overlap syndrome ([place]) 07/05/2014  . Anxiety state 02/27/2014  . Hyperlipidemia, mixed   . Hypotension   . GERD   . Osteoporosis  (Refuses treatment)   . Osteoarthritis   .  Vitamin D deficiency   . IBS   . Diverticulosis   . History of colonic polyps 07/19/2011  . Chronic obstructive airway disease with asthma ([place]) 03/16/2011    Beaulah Dinning, PT, DPT 03/21/19 3:34 PM  [facility] [name] [place], [place], [address] Phone: [phone]   Fax:  [phone]  Name: Shannon Parsons MRN: [id number] Date of Birth: Jun 26, 1941

## 2019-03-26 ENCOUNTER — Other Ambulatory Visit: Payer: Self-pay | Admitting: Physician Assistant

## 2019-03-26 MED ORDER — SERTRALINE HCL 25 MG PO TABS: 25.0000 mg | ORAL_TABLET | Freq: Every day | ORAL | 2 refills | Status: DC

## 2019-03-27 ENCOUNTER — Telehealth: Payer: Self-pay

## 2019-03-27 NOTE — Telephone Encounter (Signed)
-----   Message from Vicie Mutters, [place] sent at 03/26/2019 12:56 PM EDT ----- Regarding: RE: medication reaction Contact: [phone] Stop the celexa, it was taken off the med list, if willing try 25 mg zoloft.  Estill Bamberg ----- Message ----- From: Elenor Quinones, CMA Sent: 03/26/2019   9:26 AM EDT To: Vicie Mutters, PA-C Subject: medication reaction                            Patient recently started CELEXA, does not like the way it makes her feel. Patient would like to stop the CELEXA.   Please advise?

## 2019-03-27 NOTE — Telephone Encounter (Signed)
PATIENT states that she does not wish to start ZOLOFT at this time.  She also states that she has an appt coming & will talk to you about that at the appt.  She will stop the CELEXA & seemed happy with this request.

## 2019-03-28 ENCOUNTER — Other Ambulatory Visit: Payer: Self-pay | Admitting: Internal Medicine

## 2019-03-28 DIAGNOSIS — N6320 Unspecified lump in the left breast, unspecified quadrant: Secondary | ICD-10-CM

## 2019-03-30 ENCOUNTER — Inpatient Hospital Stay: Payer: PPO | Attending: Hematology & Oncology | Admitting: Hematology & Oncology

## 2019-03-30 ENCOUNTER — Encounter: Payer: Self-pay | Admitting: Hematology & Oncology

## 2019-03-30 ENCOUNTER — Inpatient Hospital Stay: Payer: PPO

## 2019-03-30 ENCOUNTER — Telehealth: Payer: Self-pay | Admitting: Hematology & Oncology

## 2019-03-30 ENCOUNTER — Other Ambulatory Visit: Payer: Self-pay

## 2019-03-30 VITALS — BP 166/67 | HR 64 | Temp 97.7°F | Resp 17 | Wt 120.8 lb

## 2019-03-30 VITALS — BP 156/77 | HR 64 | Temp 97.7°F | Resp 17 | Wt 120.0 lb

## 2019-03-30 DIAGNOSIS — C8310 Mantle cell lymphoma, unspecified site: Secondary | ICD-10-CM

## 2019-03-30 DIAGNOSIS — D801 Nonfamilial hypogammaglobulinemia: Secondary | ICD-10-CM

## 2019-03-30 DIAGNOSIS — Z79899 Other long term (current) drug therapy: Secondary | ICD-10-CM | POA: Diagnosis not present

## 2019-03-30 DIAGNOSIS — J479 Bronchiectasis, uncomplicated: Secondary | ICD-10-CM

## 2019-03-30 DIAGNOSIS — C8312 Mantle cell lymphoma, intrathoracic lymph nodes: Secondary | ICD-10-CM

## 2019-03-30 LAB — CMP (CANCER CENTER ONLY)
ALT: 13 U/L (ref 0–44)
AST: 26 U/L (ref 15–41)
Albumin: 4.1 g/dL (ref 3.5–5.0)
Alkaline Phosphatase: 57 U/L (ref 38–126)
Anion gap: 8 (ref 5–15)
BUN: 15 mg/dL (ref 8–23)
CO2: 26 mmol/L (ref 22–32)
Calcium: 8.5 mg/dL — ABNORMAL LOW (ref 8.9–10.3)
Chloride: 97 mmol/L — ABNORMAL LOW (ref 98–111)
Creatinine: 0.77 mg/dL (ref 0.44–1.00)
GFR, Est AFR Am: >60 mL/min (ref >60)
GFR, Estimated: >60 mL/min (ref >60)
Glucose, Bld: 83 mg/dL (ref 70–99)
Potassium: 4.3 mmol/L (ref 3.5–5.1)
Sodium: 131 mmol/L — ABNORMAL LOW (ref 135–145)
Total Bilirubin: 0.6 mg/dL (ref 0.3–1.2)
Total Protein: 5.9 g/dL — ABNORMAL LOW (ref 6.5–8.1)

## 2019-03-30 LAB — CBC WITH DIFFERENTIAL (CANCER CENTER ONLY)
Abs Immature Granulocytes: 0.02 10*3/uL (ref 0.00–0.07)
Basophils Absolute: 0.1 10*3/uL (ref 0.0–0.1)
Basophils Relative: 1 %
Eosinophils Absolute: 0.3 10*3/uL (ref 0.0–0.5)
Eosinophils Relative: 4 %
HCT: 40.1 % (ref 36.0–46.0)
Hemoglobin: 12.8 g/dL (ref 12.0–15.0)
Immature Granulocytes: 0 %
Lymphocytes Relative: 21 %
Lymphs Abs: 1.3 10*3/uL (ref 0.7–4.0)
MCH: 28.8 pg (ref 26.0–34.0)
MCHC: 31.9 g/dL (ref 30.0–36.0)
MCV: 90.3 fL (ref 80.0–100.0)
Monocytes Absolute: 0.7 10*3/uL (ref 0.1–1.0)
Monocytes Relative: 12 %
Neutro Abs: 3.7 10*3/uL (ref 1.7–7.7)
Neutrophils Relative %: 62 %
Platelet Count: 221 10*3/uL (ref 150–400)
RBC: 4.44 MIL/uL (ref 3.87–5.11)
RDW: 13.1 % (ref 11.5–15.5)
WBC Count: 6 10*3/uL (ref 4.0–10.5)
nRBC: 0 % (ref 0.0–0.2)

## 2019-03-30 MED ORDER — HEPARIN SOD (PORK) LOCK FLUSH 100 UNIT/ML IV SOLN
500.0000 [IU] | Freq: Once | INTRAVENOUS | Status: AC
  Administered 2019-03-30: 500 [IU] via INTRAVENOUS
  Filled 2019-03-30: qty 5

## 2019-03-30 MED ORDER — SODIUM CHLORIDE 0.9% FLUSH
10.0000 mL | INTRAVENOUS | Status: DC | PRN
  Administered 2019-03-30: 10 mL via INTRAVENOUS
  Filled 2019-03-30: qty 10

## 2019-03-30 NOTE — Progress Notes (Signed)
Hematology and Oncology Follow Up Visit  Shannon Parsons [id number] Oct 12, 1940 78 y.o. 03/30/2019   Principle Diagnosis:   Mantle cell lymphoma (MCIPI = 5)  Thromboembolic event of the right leg  Acquired Hypogammaglobulinemia  Bronchiectasis  Current Therapy:    Rituxan - maintanence - s/p cycle #10 - to finish in 07/2017  IVIG - q  month -restart on 10/27/2017; last dose 01/05/2018     Interim History:  [name] is back for follow-up.  She is managing to do okay despite the coronavirus.  She has been very proactive in trying to keep her self isolated.  Her husband is starting to have more issues with his memory.  She is trying her best to help him.   She still has this tremor.  I am not sure what is going on with the tremor.  She still has her Port-A-Cath.  We flushed the Port-A-Cath every couple months.  She has had no lung issues.  She did have some bronchiectasis last year.  We did give her some IVIG.  This did seem to help.  She is had no fever.  There is been no change in bowel or bladder habits.  She has had no rashes.  There is been no leg swelling.  She is not noted any swollen lymph nodes.  She is had no headache.  Overall, her performance status is ECOG 1.   Medications:  Current Outpatient Medications:  .  acetaminophen (TYLENOL) 325 MG tablet, Take 325 mg by mouth every 6 (six) hours as needed for moderate pain (For pain.). , Disp: , Rfl:  .  amoxicillin (AMOXIL) 500 MG capsule, TAKE 4 CAPSULES BY MOUTH ONE HOUR BEFORE DENTAL PROCEDURE., Disp: 8 capsule, Rfl: 0 .  b complex vitamins tablet, Take by mouth., Disp: , Rfl:  .  Cholecalciferol (VITAMIN D3) 5000 units CAPS, Take 10,000 Units by mouth daily., Disp: , Rfl:  .  hyoscyamine (LEVSIN, ANASPAZ) 0.125 MG tablet, Take 1 to 2 tablets every 4 hours as needed for nausea, cramping, bloating or diarrhea (Patient taking differently: Take 0.125 mg by mouth every 4 (four) hours as needed for cramping. ),  Disp: 90 tablet, Rfl: 11 .  ipratropium-albuterol (DUONEB) 0.5-2.5 (3) MG/3ML SOLN, Take 3 mLs by nebulization every 4 (four) hours as needed (heaviness in chest). , Disp: , Rfl:  .  levothyroxine (SYNTHROID, LEVOTHROID) 50 MCG tablet, TAKE 1 TABLET BY MOUTH ONCE DAILY., Disp: 90 tablet, Rfl: 3 .  lidocaine-prilocaine (EMLA) cream, APPLY TO PAC 1 HOUR BEFORE ACCESS., Disp: 30 g, Rfl: 3 .  polyethylene glycol (MIRALAX / GLYCOLAX) 17 g packet, Take 17 g by mouth daily., Disp: 30 each, Rfl: 3 .  PROAIR HFA 108 (90 Base) MCG/ACT inhaler, INHALE 2 PUFFS EVERY 4 HOURS AS NEEDED INTO THE LUNGS FOR WHEEZING OR SHORTNESS OF BREATH., Disp: 48 g, Rfl: 1 .  Probiotic Product (PROBIOTIC DAILY PO), Take 1 capsule by mouth daily., Disp: , Rfl:  .  Respiratory Therapy Supplies (FLUTTER) DEVI, Take as directed, Disp: 1 each, Rfl: 0 .  rosuvastatin (CRESTOR) 10 MG tablet, 1/4 tablet every other day, Disp: 30 tablet, Rfl: 3 .  sertraline (ZOLOFT) 25 MG tablet, Take 1 tablet (25 mg total) by mouth daily., Disp: 30 tablet, Rfl: 2 No current facility-administered medications for this visit.   Facility-Administered Medications Ordered in Other Visits:  .  ipratropium-albuterol (DUONEB) 0.5-2.5 (3) MG/3ML nebulizer solution 3 mL, 3 mL, Nebulization, Q6H, [place], Holli Humbles, NP  Allergies:  Allergies  Allergen Reactions  . Alendronate Other (See Comments)    "made me choke"    . Risedronate Sodium Other (See Comments)    "made me choke"    . Sulfa Antibiotics Other (See Comments)    Doesn't remember   . Sulfasalazine Other (See Comments)    Doesn't remember   . Other Rash  . Tetracyclines & Related Rash    Past Medical History, Surgical history, Social history, and Family History were reviewed and updated.  Review of Systems: Review of Systems  Constitutional: Positive for malaise/fatigue.  HENT: Positive for congestion.   Eyes: Negative.   Respiratory: Positive for shortness of breath and wheezing.    Cardiovascular: Negative.   Gastrointestinal: Negative.   Genitourinary: Negative.   Musculoskeletal: Negative.   Skin: Negative.   Neurological: Negative.   Endo/Heme/Allergies: Negative.   Psychiatric/Behavioral: Negative.     Physical Exam:  weight is 120 lb (54.4 kg). Her temporal temperature is 97.7 F (36.5 C). Her blood pressure is 156/77 (abnormal) and her pulse is 64. Her respiration is 17.   Physical Exam Vitals signs reviewed.  HENT:     Head: Normocephalic and atraumatic.  Eyes:     Pupils: Pupils are equal, round, and reactive to light.  Neck:     Musculoskeletal: Normal range of motion.  Cardiovascular:     Rate and Rhythm: Normal rate and regular rhythm.     Heart sounds: Normal heart sounds.  Pulmonary:     Effort: Pulmonary effort is normal.     Breath sounds: Normal breath sounds.  Abdominal:     General: Bowel sounds are normal.     Palpations: Abdomen is soft.  Musculoskeletal: Normal range of motion.        General: No tenderness or deformity.  Lymphadenopathy:     Cervical: No cervical adenopathy.  Skin:    General: Skin is warm and dry.     Findings: No erythema or rash.  Neurological:     Mental Status: She is alert and oriented to person, place, and time.  Psychiatric:        Behavior: Behavior normal.        Thought Content: Thought content normal.        Judgment: Judgment normal.      Lab Results  Component Value Date   WBC 6.0 03/30/2019   HGB 12.8 03/30/2019   HCT 40.1 03/30/2019   MCV 90.3 03/30/2019   PLT 221 03/30/2019     Chemistry      Component Value Date/Time   NA 131 (L) 03/30/2019 1233   NA 141 07/13/2017 0743   NA 138 08/01/2015 1124   K 4.3 03/30/2019 1233   K 4.2 07/13/2017 0743   K 4.0 08/01/2015 1124   CL 97 (L) 03/30/2019 1233   CL 105 07/13/2017 0743   CO2 26 03/30/2019 1233   CO2 27 07/13/2017 0743   CO2 28 08/01/2015 1124   BUN 15 03/30/2019 1233   BUN 16 07/13/2017 0743   BUN 10.4 08/01/2015  1124   CREATININE 0.77 03/30/2019 1233   CREATININE 0.86 03/07/2019 1258   CREATININE 0.8 08/01/2015 1124      Component Value Date/Time   CALCIUM 8.5 (L) 03/30/2019 1233   CALCIUM 9.5 07/13/2017 0743   CALCIUM 9.6 08/01/2015 1124   ALKPHOS 57 03/30/2019 1233   ALKPHOS 68 07/13/2017 0743   ALKPHOS 88 08/01/2015 1124   AST 26 03/30/2019 1233   AST 23 08/01/2015  1124   ALT 13 03/30/2019 1233   ALT 19 07/13/2017 0743   ALT 12 08/01/2015 1124   BILITOT 0.6 03/30/2019 1233   BILITOT <0.30 08/01/2015 1124      Impression and Plan: Shannon Parsons is 78 year old white female. She has mantle cell lymphoma. Again, by the [facility] she is an intermediate risk.  She received 2 different courses of chemotherapy. She could not tolerate R-CHOP with Velcade substituting for vincristine. This however was incredibly helpful. We finished up with Rituxan/bendamustine. She completed this in August 2016.  We then started maintenance Rituxan. She started this in November 2016.  She completed this in November 2018.    For right now, we can just follow her along.  We do have to see her every couple months to flush her Port-A-Cath.  I think I can probably see her back in 4 months.  I do not see need for a PET scan at the present time.   Volanda Napoleon, MD 7/17/20201:19 PM

## 2019-03-30 NOTE — Telephone Encounter (Signed)
Appointments scheduled letter/calendar mailed per 7/17 los °

## 2019-03-30 NOTE — Patient Instructions (Signed)

## 2019-03-31 LAB — BETA 2 MICROGLOBULIN, SERUM: Beta-2 Microglobulin: 2.2 mg/L (ref 0.6–2.4)

## 2019-04-02 ENCOUNTER — Ambulatory Visit
Admission: RE | Admit: 2019-04-02 | Discharge: 2019-04-02 | Disposition: A | Discharge status: Home / Self Care | Payer: PPO | Source: Ambulatory Visit | Attending: Internal Medicine | Admitting: Internal Medicine

## 2019-04-02 ENCOUNTER — Other Ambulatory Visit: Payer: Self-pay

## 2019-04-02 ENCOUNTER — Ambulatory Visit: Payer: PPO

## 2019-04-02 DIAGNOSIS — R922 Inconclusive mammogram: Secondary | ICD-10-CM | POA: Diagnosis not present

## 2019-04-02 DIAGNOSIS — N6320 Unspecified lump in the left breast, unspecified quadrant: Secondary | ICD-10-CM

## 2019-04-02 LAB — LACTATE DEHYDROGENASE: LDH: 207 U/L — ABNORMAL HIGH (ref 98–192)

## 2019-04-04 NOTE — Progress Notes (Signed)
THIS ENCOUNTER IS A VIRTUAL/TELEPHONE VISIT DUE TO COVID-19 - PATIENT WAS NOT SEEN IN THE OFFICE.  PATIENT HAS CONSENTED TO VIRTUAL VISIT / TELEMEDICINE VISIT  This provider placed a call to Shannon Parsons using telephone, her appointment was changed to a virtual office visit to reduce the risk of exposure to the COVID-19 virus and to help Shannon Parsons remain healthy and safe. The virtual visit will also provide continuity of care. She verbalizes understanding.   Assessment & Plan:   Hypothyroidism, unspecified type Continue meds the same  Acute midline thoracic back pain Continue therapy  Neck pain Continue therapy  Anxiety Continue to do self talk, meditate Can discuss other meds next OV if needed Celexa not helping and ? hyponatremia  The patient was advised to call immediately if she has any concerning symptoms in the interval. The patient voices understanding of current treatment options and is in agreement with the current care plan.The patient knows to call the clinic with any problems, questions or concerns or go to the ER if any further progression of symptoms.   Future Appointments  Date Time Provider [place]  04/11/2019  3:30 PM Hosie Spangle [facility]  04/17/2019 10:30 AM Unk Pinto, MD [facility] None  05/31/2019 12:30 PM [facility] INJ NURSE [facility] None  08/01/2019 11:30 AM [facility] LAB [facility] None  08/01/2019 11:45 AM [facility] INJ NURSE [facility] None  08/01/2019 12:30 PM [place], Rudell Cobb, MD [facility] None  10/29/2019 10:00 AM Unk Pinto, MD [facility] None  01/30/2020 11:15 AM Vicie Mutters, PA-C [facility] None       Subjective:    Patient ID: Shannon Parsons, female    DOB: 1941/04/12, 78 y.o.   MRN: [id number]  HPI 78 y.o. with history of HTN, GERD, preDM, diverticulosis, anxiety, chol and vitamin D, history of DVT right leg in 2016, history of Mantle cell lymphoma following Dr. Marin Olp presents for follow up. She was seen  last visit for anxiety and cervical/throacic back pain. She had Xrays of her thoracic back and neck that did not show any lesions, fractures and has been following with PT. She states it is doing better, she has been doing it at home.   She was given celexa but had a poor reaction to it, given zoloft but declined use for her anxiety. She states she is not on anything at this time and she is doing well, she has xanax and valium at home. She very rarely takes the xanax during the day, will do valium at night occ but she does not like the way she felt. Her sodium was 131 last visit down from 136. She would prefer to avoid meds at this time.   Her husband is going for an MRI and he is doing better and she states her anxiety is improving.   She has a history of sleep apnea but she states that she is not wearing one. Saw Dr. Brett Fairy 07/2018 but did not follow up with the sleep study.  Lab Results  Component Value Date   TSH 2.07 03/07/2019    Medications Current Outpatient Medications on File Prior to Visit  Medication Sig  . acetaminophen (TYLENOL) 325 MG tablet Take 325 mg by mouth every 6 (six) hours as needed for moderate pain (For pain.).   [name] amoxicillin (AMOXIL) 500 MG capsule TAKE 4 CAPSULES BY MOUTH ONE HOUR BEFORE DENTAL PROCEDURE.  [name] b complex vitamins tablet Take by mouth.  . Cholecalciferol (VITAMIN D3) 5000 units  CAPS Take 10,000 Units by mouth daily.  . hyoscyamine (LEVSIN, ANASPAZ) 0.125 MG tablet Take 1 to 2 tablets every 4 hours as needed for nausea, cramping, bloating or diarrhea (Patient taking differently: Take 0.125 mg by mouth every 4 (four) hours as needed for cramping. )  . ipratropium-albuterol (DUONEB) 0.5-2.5 (3) MG/3ML SOLN Take 3 mLs by nebulization every 4 (four) hours as needed (heaviness in chest).   [name] levothyroxine (SYNTHROID, LEVOTHROID) 50 MCG tablet TAKE 1 TABLET BY MOUTH ONCE DAILY.  [name] lidocaine-prilocaine (EMLA) cream APPLY TO PAC 1 HOUR BEFORE ACCESS.  [name]  polyethylene glycol (MIRALAX / GLYCOLAX) 17 g packet Take 17 g by mouth daily.  [name] PROAIR HFA 108 (90 Base) MCG/ACT inhaler INHALE 2 PUFFS EVERY 4 HOURS AS NEEDED INTO THE LUNGS FOR WHEEZING OR SHORTNESS OF BREATH.  . Probiotic Product (PROBIOTIC DAILY PO) Take 1 capsule by mouth daily.  [name] Respiratory Therapy Supplies (FLUTTER) DEVI Take as directed  . rosuvastatin (CRESTOR) 10 MG tablet 1/4 tablet every other day  . sertraline (ZOLOFT) 25 MG tablet Take 1 tablet (25 mg total) by mouth daily.   Current Facility-Administered Medications on File Prior to Visit  Medication  . ipratropium-albuterol (DUONEB) 0.5-2.5 (3) MG/3ML nebulizer solution 3 mL    Problem list She has Chronic obstructive airway disease with asthma ([place]); History of colonic polyps; Hyperlipidemia, mixed; Hypotension; GERD; Osteoporosis  (Refuses treatment); Osteoarthritis; Vitamin D deficiency; IBS; Diverticulosis; Anxiety state; OSA and COPD overlap syndrome ([place]); Medication management; Mantle cell lymphoma ([place]); Normocytic anemia; History of DVT (deep vein thrombosis); Body mass index (BMI) of 20.0-20.9 in adult; Obstructive bronchiectasis ([place]); Acquired hypogammaglobulinemia ([place]); Atherosclerosis of aorta ([place]); Sinusitis, maxillary, chronic; Cervical dystonia; Tremor; Hypothyroidism; Abnormal glucose; and Labile hypertension on their problem list.   Review of Systems  Constitutional: Positive for fatigue. Negative for chills, diaphoresis and fever.  HENT: Negative for congestion, ear pain, postnasal drip, sinus pressure, sneezing and sore throat.   Respiratory: Negative for apnea, cough, chest tightness, shortness of breath and wheezing.   Cardiovascular: Negative.  Negative for chest pain, palpitations and leg swelling.  Gastrointestinal: Negative.   Genitourinary: Negative.   Musculoskeletal: Positive for back pain (better with PT) and neck pain.  Neurological: Positive for tremors (unchanged and has been evaluated).  Negative for dizziness, syncope, facial asymmetry, speech difficulty, weakness, light-headedness, numbness and headaches.  Psychiatric/Behavioral: Positive for sleep disturbance. Negative for agitation, confusion, decreased concentration, dysphoric mood, hallucinations, self-injury and suicidal ideas. The patient is not nervous/anxious.        Objective:   Physical Exam General Appearance:Well sounding, in no apparent distress.  ENT/Mouth: No hoarseness, No cough for duration of visit.  Respiratory: completing full sentences without distress, without audible wheeze Neuro: Awake and oriented X 3,  Psych:  Insight and Judgment appropriate.      Vicie Mutters, PA-C

## 2019-04-05 ENCOUNTER — Encounter: Payer: Self-pay | Admitting: Physician Assistant

## 2019-04-05 ENCOUNTER — Ambulatory Visit: Payer: PPO | Admitting: Physician Assistant

## 2019-04-05 ENCOUNTER — Other Ambulatory Visit: Payer: Self-pay

## 2019-04-05 VITALS — Wt 120.0 lb

## 2019-04-05 DIAGNOSIS — M542 Cervicalgia: Secondary | ICD-10-CM | POA: Diagnosis not present

## 2019-04-05 DIAGNOSIS — F419 Anxiety disorder, unspecified: Secondary | ICD-10-CM | POA: Diagnosis not present

## 2019-04-05 DIAGNOSIS — E039 Hypothyroidism, unspecified: Secondary | ICD-10-CM | POA: Diagnosis not present

## 2019-04-05 DIAGNOSIS — M546 Pain in thoracic spine: Secondary | ICD-10-CM

## 2019-04-11 ENCOUNTER — Encounter: Payer: Self-pay | Admitting: Physical Therapy

## 2019-04-11 ENCOUNTER — Ambulatory Visit: Payer: PPO | Admitting: Physical Therapy

## 2019-04-11 ENCOUNTER — Other Ambulatory Visit: Payer: Self-pay

## 2019-04-11 DIAGNOSIS — M546 Pain in thoracic spine: Secondary | ICD-10-CM

## 2019-04-11 DIAGNOSIS — M542 Cervicalgia: Secondary | ICD-10-CM

## 2019-04-11 NOTE — Therapy (Signed)
[place], [place], [address] Phone: [phone]   Fax:  [phone]  Physical Therapy Treatment  Patient Details  Name: Shannon Parsons MRN: [id number] Date of Birth: 1941-03-02 Referring Provider (PT): Vicie Mutters, [place]   Encounter Date: 04/11/2019  PT End of Session - 04/11/19 5681    Visit Number  2    Number of Visits  6    Date for PT Re-Evaluation  05/02/19    Authorization Type  [employer], progress note by visit 10 and KX at visit 15    PT Start Time  1529    PT Stop Time  1610    PT Time Calculation (min)  41 min    Activity Tolerance  Patient tolerated treatment well    Behavior During Therapy  [facility] for tasks assessed/performed       Past Medical History:  Diagnosis Date  . Adenomatous colon polyp 1994  . Asthma   . Cataract 2013   bilateral eyes  . Diverticulosis   . Family history of ischemic heart disease   . Fibrocystic breast disease   . GERD (gastroesophageal reflux disease)   . Hiatal hernia   . Hyperlipidemia   . Hypertension    pt denies - 01/16/18  . IBS (irritable bowel syndrome)   . Internal hemorrhoids   . Mantle cell lymphoma ([place]) 10/18/2014  . Melanoma ([place])    Facial, pt denies on 01/16/18  . OSA (obstructive sleep apnea)   . Osteoarthritis   . Osteoporosis   . Prediabetes   . Unspecified hypothyroidism   . Vitamin D deficiency     Past Surgical History:  Procedure Laterality Date  . APPENDECTOMY    . BUNIONECTOMY    . CATARACT EXTRACTION, BILATERAL    . KNEE ARTHROSCOPY    . TONSILLECTOMY    . UMBILICAL HERNIA REPAIR    . VIDEO BRONCHOSCOPY Bilateral 10/12/2017   Procedure: VIDEO BRONCHOSCOPY WITHOUT FLUORO;  Surgeon: Tanda Rockers, MD;  Location: [facility]COPY;  Service: Endoscopy;  Laterality: Bilateral;    There were no vitals filed for this visit.  Subjective Assessment - 04/11/19 1528    Subjective  Pt. returns for first  follow up visit since eval 03/21/19 (per her preference visits limited with focus on HEP due to concerns about Covid). She reports has noted some mild improvement with symptoms.    Pertinent History  osteoporosis, Mantle cell lymphoma (last chemo was in 2016), X-ray findings of thoracolumbar scoliosis and cervical spondylosis.    Limitations  Standing;Lifting;Walking;House hold activities    Patient Stated Goals  Decrease pain, improve standing tolerance and strength, posture    Currently in Pain?  No/denies                       [facility] Adult PT Treatment/Exercise - 04/11/19 0001      Neck Exercises: Standing   Other Standing Exercises  standing DB row single arm with opp. hand support on plinth x 10 ea. bilat. with 3 lbs., also performed 10 reps standing DB extension with 3 lbs. ea. side bilat.      Neck Exercises: Seated   Neck Retraction  15 reps    Other Seated Exercise  seated thoracic extension over rolled pillow x 15 reps      Neck Exercises: Supine   Other Supine Exercise  supine Theraband diagonal "chops" green band 2x10      Manual Therapy  Manual Therapy  Soft tissue mobilization    Soft tissue mobilization  STM bilat. upper traps in sitting             PT Education - 04/11/19 1614    Education Details  exercise form, POC, HEP updates    Person(s) Educated  Patient    Methods  Explanation;Demonstration;Tactile cues;Verbal cues;Handout    Comprehension  Returned demonstration;Verbalized understanding          PT Long Term Goals - 03/21/19 1527      PT LONG TERM GOAL #1   Title  Independent with HEP    Baseline  needs HEP    Time  6    Period  Weeks    Status  New    Target Date  05/02/19      PT LONG TERM GOAL #2   Title  Tolerate standing periods at least 20-30 minutes while cooking, doing chores with pain 2/10 or less    Baseline  5/10, difficulty tolerating due to pain    Time  6    Period  Weeks    Status  New    Target Date   05/02/19      PT LONG TERM GOAL #3   Title  Improve FOTO score to 28% or less impairment    Baseline  33% limited    Time  6    Period  Weeks    Status  New    Target Date  05/02/19      PT LONG TERM GOAL #4   Title  Perform reaching activities for grocery shopping and cleaning at home without limitation due to neck/thoracic pain    Baseline  ability limited    Time  6    Period  Weeks    Status  Achieved    Target Date  05/02/19      PT LONG TERM GOAL #5   Title  --    Time  --    Period  --    Status  --    Target Date  --            Plan - 04/11/19 1614    Clinical Impression Statement  Pt. returns showing progress from baseline status with decreased pain and improved positional tolerance for activities such as doing dishes. Updated HEP and plan for pt. to continue HEP and follow up on as needed basis within remaining duration of plan of care.    Personal Factors and Comorbidities  Age;Comorbidity 2    Comorbidities  osteoporosis, lymphoma    Examination-Activity Limitations  Carry;Stand;Reach Overhead    Examination-Participation Restrictions  Community Activity;Cleaning;Meal Prep    Stability/Clinical Decision Making  Stable/Uncomplicated    Clinical Decision Making  Low    Rehab Potential  Good    PT Frequency  --   6 visits   PT Duration  6 weeks    PT Treatment/Interventions  ADLs/Self Care Home Management;Moist Heat;Therapeutic activities;Therapeutic exercise;Neuromuscular re-education;Manual techniques;Dry needling;Taping;Patient/family education    PT Next Visit Plan  Review HEP as needed, progress postural strengthening and stretches, STM upper trapezius and thoracic paraspinals (wishes to defer dry needling for now), MHP prn    PT Home Exercise Plan  Theraband vs. standing DB row, horiz. and with diagonals in supine, standing Theraband vs. DB shoulder extension, doorway pec stretch, seated thoracic extension, upper trap stretch, neck retractions.     Consulted and Agree with Plan of Care  Patient  Patient will benefit from skilled therapeutic intervention in order to improve the following deficits and impairments:  Pain, Impaired UE functional use, Increased muscle spasms, Decreased activity tolerance, Decreased strength, Postural dysfunction  Visit Diagnosis: 1. Pain in thoracic spine   2. Cervicalgia        Problem List Patient Active Problem List   Diagnosis Date Noted  . Hypothyroidism 10/04/2018  . Abnormal glucose 10/04/2018  . Labile hypertension 10/04/2018  . Cervical dystonia 01/16/2018  . Sinusitis, maxillary, chronic 10/19/2017  . Atherosclerosis of aorta ([place]) 08/09/2017  . Tremor 05/06/2016  . Acquired hypogammaglobulinemia ([place]) 04/05/2016  . Obstructive bronchiectasis ([place]) 03/18/2016  . Body mass index (BMI) of 20.0-20.9 in adult 07/21/2015  . History of DVT (deep vein thrombosis)   . Normocytic anemia 01/04/2015  . Mantle cell lymphoma ([place]) 10/18/2014  . Medication management 07/15/2014  . OSA and COPD overlap syndrome ([place]) 07/05/2014  . Anxiety state 02/27/2014  . Hyperlipidemia, mixed   . Hypotension   . GERD   . Osteoporosis  (Refuses treatment)   . Osteoarthritis   . Vitamin D deficiency   . IBS   . Diverticulosis   . History of colonic polyps 07/19/2011  . Chronic obstructive airway disease with asthma ([place]) 03/16/2011    Beaulah Dinning, PT, DPT 04/11/19 4:17 PM  [facility] [address] [place], [place], [address] Phone: [phone]   Fax:  [phone]  Name: Shannon Parsons MRN: [id number] Date of Birth: 30-May-1941

## 2019-04-16 ENCOUNTER — Encounter: Payer: Self-pay | Admitting: Internal Medicine

## 2019-04-16 NOTE — Patient Instructions (Signed)

## 2019-04-16 NOTE — Progress Notes (Signed)
History of Present Illness:       This very nice 78 y.o. MWF presents for 6 month follow up with HTN, HLD, Hereditary Tremor, Hypothyroidism, Pre-Diabetes and Vitamin D Deficiency. Patient has been followed by Dr Marin Olp for a Mantle Cell Lymphoma (09/2014) with ChemoRadiation tx thru 01/05/2018.  In Jan 2016, she was also treated for a RLE DVT w/Xarelto x 6 months. Patient has repeatedly declined treatment of her Osteoporosis. She is followed by Dr Melvyn Novas for COPD/Emphysema.       Patient is followed for labile HTN & BP has been controlled at home. Today's BP is at goal - 134/80. Patient has had no complaints of any cardiac type chest pain, palpitations, dyspnea / orthopnea / PND, dizziness, claudication, or dependent edema.      Hyperlipidemia is controlled with diet & meds. Patient denies myalgias or other med SE's. Last Lipids were at goal: Lab Results  Component Value Date   CHOL 174 10/04/2018   HDL 62 10/04/2018   LDLCALC 91 10/04/2018   TRIG 112 10/04/2018   CHOLHDL 2.8 10/04/2018       Also, the patient has history of PreDiabetes (A1c 5.9% / 2012) and has had no symptoms of reactive hypoglycemia, diabetic polys, paresthesias or visual blurring.  Last A1c was Normal & at goal: Lab Results  Component Value Date   HGBA1C 5.5 10/04/2018       Patient was dx'd Hypothyroid in 2013 & has been on replacement therapy since.      Further, the patient also has history of Vitamin D Deficiency and supplements vitamin D without any suspected side-effects. Last vitamin D was at goal: Lab Results  Component Value Date   VD25OH 83 10/04/2018   Current Outpatient Medications on File Prior to Visit  Medication Sig  . acetaminophen (TYLENOL) 325 MG tablet Take 325 mg by mouth every 6 (six) hours as needed for moderate pain (For pain.).   [name] amoxicillin (AMOXIL) 500 MG capsule TAKE 4 CAPSULES BY MOUTH ONE HOUR BEFORE DENTAL PROCEDURE.  [name] Cholecalciferol (VITAMIN D3) 5000 units CAPS Take 8,000  Units by mouth daily.   . hyoscyamine (LEVSIN, ANASPAZ) 0.125 MG tablet Take 1 to 2 tablets every 4 hours as needed for nausea, cramping, bloating or diarrhea (Patient taking differently: Take 0.125 mg by mouth every 4 (four) hours as needed for cramping. )  . ipratropium-albuterol (DUONEB) 0.5-2.5 (3) MG/3ML SOLN Take 3 mLs by nebulization every 4 (four) hours as needed (heaviness in chest).   [name] levothyroxine (SYNTHROID, LEVOTHROID) 50 MCG tablet TAKE 1 TABLET BY MOUTH ONCE DAILY.  [name] lidocaine-prilocaine (EMLA) cream APPLY TO PAC 1 HOUR BEFORE ACCESS.  [name] OVER THE COUNTER MEDICATION Takes Super C Complex 1 daily.  . polyethylene glycol (MIRALAX / GLYCOLAX) 17 g packet Take 17 g by mouth daily.  [name] PROAIR HFA 108 (90 Base) MCG/ACT inhaler INHALE 2 PUFFS EVERY 4 HOURS AS NEEDED INTO THE LUNGS FOR WHEEZING OR SHORTNESS OF BREATH.  . Probiotic Product (PROBIOTIC DAILY PO) Take 1 capsule by mouth daily.  [name] Respiratory Therapy Supplies (FLUTTER) DEVI Take as directed  . rosuvastatin (CRESTOR) 10 MG tablet 1/4 tablet every other day   Current Facility-Administered Medications on File Prior to Visit  Medication  . ipratropium-albuterol (DUONEB) 0.5-2.5 (3) MG/3ML nebulizer solution 3 mL   Allergies  Allergen Reactions  . Alendronate Other (See Comments)    "made me choke"    . Risedronate Sodium Other (See Comments)    "  made me choke"    . Sulfa Antibiotics Other (See Comments)    Doesn't remember   . Sulfasalazine Other (See Comments)    Doesn't remember   . Other Rash  . Tetracyclines & Related Rash    PMHx:   Past Medical History:  Diagnosis Date  . Adenomatous colon polyp 1994  . Asthma   . Cataract 2013   bilateral eyes  . Diverticulosis   . Family history of ischemic heart disease   . Fibrocystic breast disease   . GERD (gastroesophageal reflux disease)   . Hiatal hernia   . Hyperlipidemia   . Hypertension    pt denies - 01/16/18  . IBS (irritable bowel syndrome)   .  Internal hemorrhoids   . Mantle cell lymphoma ([place]) 10/18/2014  . Melanoma ([place])    Facial, pt denies on 01/16/18  . OSA (obstructive sleep apnea)   . Osteoarthritis   . Osteoporosis   . Prediabetes   . Unspecified hypothyroidism   . Vitamin D deficiency    Immunization History  Administered Date(s) Administered  . Pneumococcal Conjugate-13 03/18/2016  . Pneumococcal Polysaccharide-23 06/18/2013  . Zoster 04/17/2013   Past Surgical History:  Procedure Laterality Date  . APPENDECTOMY    . BUNIONECTOMY    . CATARACT EXTRACTION, BILATERAL    . KNEE ARTHROSCOPY    . TONSILLECTOMY    . UMBILICAL HERNIA REPAIR    . VIDEO BRONCHOSCOPY Bilateral 10/12/2017   Procedure: VIDEO BRONCHOSCOPY WITHOUT FLUORO;  Surgeon: Tanda Rockers, MD;  Location: [facility];  Service: Endoscopy;  Laterality: Bilateral;    FHx:    Reviewed / unchanged  SHx:    Reviewed / unchanged   Systems Review:  Constitutional: Denies fever, chills, wt changes, headaches, insomnia, fatigue, night sweats, change in appetite. Eyes: Denies redness, blurred vision, diplopia, discharge, itchy, watery eyes.  ENT: Denies discharge, congestion, post nasal drip, epistaxis, sore throat, earache, hearing loss, dental pain, tinnitus, vertigo, sinus pain, snoring.  CV: Denies chest pain, palpitations, irregular heartbeat, syncope, dyspnea, diaphoresis, orthopnea, PND, claudication or edema. Respiratory: denies cough, dyspnea, DOE, pleurisy, hoarseness, laryngitis, wheezing.  Gastrointestinal: Denies dysphagia, odynophagia, heartburn, reflux, water brash, abdominal pain or cramps, nausea, vomiting, bloating, diarrhea, constipation, hematemesis, melena, hematochezia  or hemorrhoids. Genitourinary: Denies dysuria, frequency, urgency, nocturia, hesitancy, discharge, hematuria or flank pain. Musculoskeletal: Denies arthralgias, myalgias, stiffness, jt. swelling, pain, limping or strain/sprain.  Skin: Denies pruritus, rash, hives,  warts, acne, eczema or change in skin lesion(s). Neuro: No weakness, tremor, incoordination, spasms, paresthesia or pain. Psychiatric: Denies confusion, memory loss or sensory loss. Endo: Denies change in weight, skin or hair change.  Heme/Lymph: No excessive bleeding, bruising or enlarged lymph nodes.  Physical Exam  BP 134/80   Pulse 68   Temp (!) 97.4 F (36.3 C)   Resp 16   Ht 5\' 4"  (1.626 m)   Wt 119 lb 9.6 oz (54.3 kg)   LMP 09/14/2014   BMI 20.53 kg/m   Appears  well nourished, well groomed  and in no distress.  Eyes: PERRLA, EOMs, conjunctiva no swelling or erythema. Sinuses: No frontal/maxillary tenderness ENT/Mouth: EAC's clear, TM's nl w/o erythema, bulging. Nares clear w/o erythema, swelling, exudates. Oropharynx clear without erythema or exudates. Oral hygiene is good. Tongue normal, non obstructing. Hearing intact.  Neck: Supple. Thyroid not palpable. Car 2+/2+ without bruits, nodes or JVD. Chest: Respirations nl with BS clear & equal w/o rales, rhonchi, wheezing or stridor.  Cor: Heart sounds normal w/ regular  rate and rhythm without sig. murmurs, gallops, clicks or rubs. Peripheral pulses normal and equal  without edema.  Abdomen: Soft & bowel sounds normal. Non-tender w/o guarding, rebound, hernias, masses or organomegaly.  Lymphatics: Unremarkable.  Musculoskeletal: Full ROM all peripheral extremities, joint stability, 5/5 strength and normal gait.  Skin: Warm, dry without exposed rashes, lesions or ecchymosis apparent.  Neuro: Cranial nerves intact, reflexes equal bilaterally. Sensory-motor testing grossly intact. Tendon reflexes grossly intact.  Pysch: Alert & oriented x 3.  Insight and judgement nl & appropriate. No ideations.  Assessment and Plan:  1. Labile hypertension  - Continue medication, monitor blood pressure at home.  - Continue DASH diet.  Reminder to go to the ER if any CP,  SOB, nausea, dizziness, severe HA, changes vision/speech.  - CBC  with Differential/Platelet - COMPLETE METABOLIC PANEL WITH GFR - Magnesium - TSH  2. Hyperlipidemia, mixed  - Continue diet/meds, exercise,& lifestyle modifications.  - Continue monitor periodic cholesterol/liver & renal functions   - Lipid panel - TSH  3. Abnormal glucose  - Continue diet, exercise  - Lifestyle modifications.  - Monitor appropriate labs.  - Hemoglobin A1c - Insulin, random  4. Vitamin D deficiency  - Continue supplementation.  - VITAMIN D 25 Hydroxyl  5. Prediabetes  - Hemoglobin A1c - Insulin, random  6. Hypothyroidism  - TSH  7. Medication management  - CBC with Differential/Platelet - COMPLETE METABOLIC PANEL WITH GFR - Magnesium - Lipid panel - TSH - Hemoglobin A1c - Insulin, random - VITAMIN D 25 Hydroxyl        Discussed  regular exercise, BP monitoring, weight control to achieve/maintain BMI less than 25 and discussed med and SE's. Recommended labs to assess and monitor clinical status with further disposition pending results of labs.  I discussed the assessment and treatment plan with the patient. The patient was provided an opportunity to ask questions and all were answered. The patient agreed with the plan and demonstrated an understanding of the instructions.  I provided over 30 minutes of exam, counseling, chart review and  complex critical decision making.   Kirtland Bouchard, MD

## 2019-04-17 ENCOUNTER — Other Ambulatory Visit: Payer: Self-pay

## 2019-04-17 ENCOUNTER — Ambulatory Visit (INDEPENDENT_AMBULATORY_CARE_PROVIDER_SITE_OTHER): Payer: PPO | Admitting: Internal Medicine

## 2019-04-17 VITALS — BP 134/80 | HR 68 | Temp 97.4°F | Resp 16 | Ht 64.0 in | Wt 119.6 lb

## 2019-04-17 DIAGNOSIS — Z79899 Other long term (current) drug therapy: Secondary | ICD-10-CM | POA: Diagnosis not present

## 2019-04-17 DIAGNOSIS — E039 Hypothyroidism, unspecified: Secondary | ICD-10-CM | POA: Diagnosis not present

## 2019-04-17 DIAGNOSIS — E559 Vitamin D deficiency, unspecified: Secondary | ICD-10-CM

## 2019-04-17 DIAGNOSIS — R0989 Other specified symptoms and signs involving the circulatory and respiratory systems: Secondary | ICD-10-CM

## 2019-04-17 DIAGNOSIS — R7303 Prediabetes: Secondary | ICD-10-CM | POA: Diagnosis not present

## 2019-04-17 DIAGNOSIS — E782 Mixed hyperlipidemia: Secondary | ICD-10-CM | POA: Diagnosis not present

## 2019-04-17 DIAGNOSIS — R7309 Other abnormal glucose: Secondary | ICD-10-CM

## 2019-04-18 LAB — CBC WITH DIFFERENTIAL/PLATELET
Absolute Monocytes: 676 cells/uL (ref 200–950)
Basophils Absolute: 57 cells/uL (ref 0–200)
Basophils Relative: 1.1 %
Eosinophils Absolute: 187 cells/uL (ref 15–500)
Eosinophils Relative: 3.6 %
HCT: 40.7 % (ref 35.0–45.0)
Hemoglobin: 13.3 g/dL (ref 11.7–15.5)
Lymphs Abs: 1212 cells/uL (ref 850–3900)
MCH: 29.2 pg (ref 27.0–33.0)
MCHC: 32.7 g/dL (ref 32.0–36.0)
MCV: 89.3 fL (ref 80.0–100.0)
MPV: 9.9 fL (ref 7.5–12.5)
Monocytes Relative: 13 %
Neutro Abs: 3068 cells/uL (ref 1500–7800)
Neutrophils Relative %: 59 %
Platelets: 229 10*3/uL (ref 140–400)
RBC: 4.56 10*6/uL (ref 3.80–5.10)
RDW: 12.3 % (ref 11.0–15.0)
Total Lymphocyte: 23.3 %
WBC: 5.2 10*3/uL (ref 3.8–10.8)

## 2019-04-18 LAB — HEMOGLOBIN A1C
Hgb A1c MFr Bld: 5.6 % of total Hgb (ref <5.7)
Mean Plasma Glucose: 114 (calc)
eAG (mmol/L): 6.3 (calc)

## 2019-04-18 LAB — COMPLETE METABOLIC PANEL WITH GFR
AG Ratio: 2.4 (calc) (ref 1.0–2.5)
ALT: 14 U/L (ref 6–29)
AST: 28 U/L (ref 10–35)
Albumin: 4.5 g/dL (ref 3.6–5.1)
Alkaline phosphatase (APISO): 54 U/L (ref 37–153)
BUN: 14 mg/dL (ref 7–25)
CO2: 29 mmol/L (ref 20–32)
Calcium: 9.8 mg/dL (ref 8.6–10.4)
Chloride: 101 mmol/L (ref 98–110)
Creat: 0.91 mg/dL (ref 0.60–0.93)
GFR, Est African American: 72 mL/min/{1.73_m2} (ref >=60)
GFR, Est Non African American: 62 mL/min/{1.73_m2} (ref >=60)
Globulin: 1.9 g/dL (calc) (ref 1.9–3.7)
Glucose, Bld: 87 mg/dL (ref 65–99)
Potassium: 4.4 mmol/L (ref 3.5–5.3)
Sodium: 137 mmol/L (ref 135–146)
Total Bilirubin: 0.5 mg/dL (ref 0.2–1.2)
Total Protein: 6.4 g/dL (ref 6.1–8.1)

## 2019-04-18 LAB — MAGNESIUM: Magnesium: 2.1 mg/dL (ref 1.5–2.5)

## 2019-04-18 LAB — INSULIN, RANDOM: Insulin: 2.8 u[IU]/mL

## 2019-04-18 LAB — LIPID PANEL
Cholesterol: 186 mg/dL (ref <200)
HDL: 62 mg/dL (ref >=50)
LDL Cholesterol (Calc): 103 mg/dL (calc) — ABNORMAL HIGH
Non-HDL Cholesterol (Calc): 124 mg/dL (calc) (ref <130)
Total CHOL/HDL Ratio: 3 (calc) (ref <5.0)
Triglycerides: 113 mg/dL (ref <150)

## 2019-04-18 LAB — VITAMIN D 25 HYDROXY (VIT D DEFICIENCY, FRACTURES): Vit D, 25-Hydroxy: 69 ng/mL (ref 30–100)

## 2019-04-18 LAB — TSH: TSH: 2.29 mIU/L (ref 0.40–4.50)

## 2019-04-19 ENCOUNTER — Encounter: Payer: Self-pay | Admitting: *Deleted

## 2019-04-20 ENCOUNTER — Other Ambulatory Visit: Payer: Self-pay | Admitting: *Deleted

## 2019-04-20 DIAGNOSIS — C8310 Mantle cell lymphoma, unspecified site: Secondary | ICD-10-CM

## 2019-04-20 MED ORDER — LORAZEPAM 0.5 MG PO TABS: 0.5000 mg | ORAL_TABLET | Freq: Four times a day (QID) | ORAL | 0 refills | Status: DC | PRN

## 2019-04-23 NOTE — Progress Notes (Signed)
Subjective:    Patient ID: Shannon Parsons, female    DOB: 01-06-1941, 78 y.o.   MRN: [id number]  HPI 78 y.o. WF with history of HTN, GERD, preDM, diverticulosis, anxiety, chol and vitamin D and history o f Mantle cell lymphoma followed by Dr. Marin Olp, presents with Ab bloating x Saturday.  Has long history of AB bloating, intermittent but states that this has been worse. She is still having BM's. Has every morning, very regular. She does not have to strain. No pain with BM. [place] 3-4. She has bad gas, a lot of noise in her stomach with her husband hearing it from across the room.  She is on levsin 2-3 times a day x Sunday and has been on gas X 3 pills total. She will have occ episodes of diarrhea with vomiting.  She is NOT on probiotic x several days.Not on miralax.  Took amoxicillin 2000 mg 3 weeks ago for dental procedure.   Has been worse with milk and breads.   Colonoscopy 01/2017 EGD 2015 2016 pelvic US negative for Ab bloating  Lab Results  Component Value Date   TSH 2.29 04/17/2019    Blood pressure 124/76, temperature (!) 97 F (36.1 C), height 5\' 4"  (1.626 m), weight 116 lb 12.8 oz (53 kg), last menstrual period 09/14/2014.  Medications Current Outpatient Medications on File Prior to Visit  Medication Sig  . acetaminophen (TYLENOL) 325 MG tablet Take 325 mg by mouth every 6 (six) hours as needed for moderate pain (For pain.).   [name] amoxicillin (AMOXIL) 500 MG capsule TAKE 4 CAPSULES BY MOUTH ONE HOUR BEFORE DENTAL PROCEDURE.  [name] Cholecalciferol (VITAMIN D3) 5000 units CAPS Take 8,000 Units by mouth daily.   . hyoscyamine (LEVSIN, ANASPAZ) 0.125 MG tablet Take 1 to 2 tablets every 4 hours as needed for nausea, cramping, bloating or diarrhea (Patient taking differently: Take 0.125 mg by mouth every 4 (four) hours as needed for cramping. )  . ipratropium-albuterol (DUONEB) 0.5-2.5 (3) MG/3ML SOLN Take 3 mLs by nebulization every 4 (four) hours as needed (heaviness in chest).    [name] levothyroxine (SYNTHROID, LEVOTHROID) 50 MCG tablet TAKE 1 TABLET BY MOUTH ONCE DAILY.  [name] lidocaine-prilocaine (EMLA) cream APPLY TO PAC 1 HOUR BEFORE ACCESS.  [name] LORazepam (ATIVAN) 0.5 MG tablet Take 1 tablet (0.5 mg total) by mouth every 6 (six) hours as needed (Nausea or vomiting).  [name] OVER THE COUNTER MEDICATION Takes Super C Complex 1 daily.  . polyethylene glycol (MIRALAX / GLYCOLAX) 17 g packet Take 17 g by mouth daily.  [name] PROAIR HFA 108 (90 Base) MCG/ACT inhaler INHALE 2 PUFFS EVERY 4 HOURS AS NEEDED INTO THE LUNGS FOR WHEEZING OR SHORTNESS OF BREATH.  . Probiotic Product (PROBIOTIC DAILY PO) Take 1 capsule by mouth daily.  [name] Respiratory Therapy Supplies (FLUTTER) DEVI Take as directed  . rosuvastatin (CRESTOR) 10 MG tablet 1/4 tablet every other day   Current Facility-Administered Medications on File Prior to Visit  Medication  . ipratropium-albuterol (DUONEB) 0.5-2.5 (3) MG/3ML nebulizer solution 3 mL    Problem list She has Chronic obstructive airway disease with asthma ([place]); History of colonic polyps; Hyperlipidemia, mixed; Hypotension; GERD; Osteoporosis  (Refuses treatment); Osteoarthritis; Vitamin D deficiency; IBS; Diverticulosis; Anxiety state; OSA and COPD overlap syndrome ([place]); Medication management; Mantle cell lymphoma ([place]); Normocytic anemia; History of DVT (deep vein thrombosis); Body mass index (BMI) of 20.0-20.9 in adult; Obstructive bronchiectasis ([place]); Acquired hypogammaglobulinemia ([place]); Atherosclerosis of aorta ([place]); Sinusitis, maxillary, chronic; Cervical dystonia;  Tremor; Hypothyroidism; Abnormal glucose; and Labile hypertension on their problem list.   Review of Systems  Constitutional: Negative.   HENT: Negative.   Respiratory: Negative.   Cardiovascular: Negative.   Gastrointestinal: Positive for abdominal distention and constipation. Negative for abdominal pain, anal bleeding, blood in stool, diarrhea, nausea, rectal pain and vomiting.   Genitourinary: Negative.   Musculoskeletal: Negative.   Skin: Negative.   Neurological: Negative.   Hematological: Negative.   Psychiatric/Behavioral: Negative.        Objective:   Physical Exam Constitutional:      Appearance: She is well-developed.  HENT:     Head: Normocephalic and atraumatic.     Nose: Nose normal.     Mouth/Throat:     Pharynx: No oropharyngeal exudate.  Eyes:     Pupils: Pupils are equal, round, and reactive to light.  Neck:     Musculoskeletal: Normal range of motion.     Thyroid: No thyroid mass or thyromegaly.     Trachea: Trachea normal.  Cardiovascular:     Rate and Rhythm: Normal rate and regular rhythm.  Pulmonary:     Effort: Pulmonary effort is normal.     Breath sounds: Normal breath sounds.  Abdominal:     General: Bowel sounds are normal. There is distension (lower AB, with right tenderness).     Palpations: Abdomen is soft. Abdomen is not rigid.     Tenderness: There is generalized abdominal tenderness (diffuse). There is no guarding or rebound. Negative signs include Murphy's sign, Rovsing's sign and McBurney's sign.  Musculoskeletal: Normal range of motion.  Lymphadenopathy:     Head:     Right side of head: No submental, submandibular, tonsillar, preauricular, posterior auricular or occipital adenopathy.     Left side of head: No submental, submandibular, tonsillar, preauricular, posterior auricular or occipital adenopathy.     Cervical: No cervical adenopathy.     Right cervical: No deep or posterior cervical adenopathy.    Left cervical: No deep or posterior cervical adenopathy.     Lower Body: No right inguinal adenopathy. No left inguinal adenopathy.  Skin:    General: Skin is warm and dry.     Findings: No rash.  Neurological:     Mental Status: She is alert and oriented to person, place, and time.     Comments: + essential tremor  Psychiatric:        Behavior: Behavior normal.           Assessment & Plan:    Atherosclerosis of aorta (HCC) Control blood pressure, cholesterol, glucose, increase exercise.   GERD Monitor  Irritable bowel syndrome, unspecified type ? Part of the issues Went over FODMAP diet and what to avoid Try sample of amitiza 8 mg BID Will refer to GI for evaluation, no red flags, rule out SBO, etc.  -     Ambulatory referral to Gastroenterology -     Celiac Disease Comprehensive Panel with Reflexes  Hypothyroidism, unspecified type -     CBC with Differential/Platelet -     COMPLETE METABOLIC PANEL WITH GFR  Anxiety state Monitor  Mantle cell lymphoma of intrathoracic lymph nodes (HCC) No weight loss, no night sweats  The patient was advised to call immediately if she has any concerning symptoms in the interval. The patient voices understanding of current treatment options and is in agreement with the current care plan.The patient knows to call the clinic with any problems, questions or concerns or go to the ER if  any further progression of symptoms.

## 2019-04-24 ENCOUNTER — Other Ambulatory Visit: Payer: Self-pay

## 2019-04-24 ENCOUNTER — Ambulatory Visit (INDEPENDENT_AMBULATORY_CARE_PROVIDER_SITE_OTHER): Payer: PPO | Admitting: Physician Assistant

## 2019-04-24 ENCOUNTER — Encounter: Payer: Self-pay | Admitting: Physician Assistant

## 2019-04-24 VITALS — BP 124/76 | Temp 97.0°F | Ht 64.0 in | Wt 116.8 lb

## 2019-04-24 DIAGNOSIS — C8312 Mantle cell lymphoma, intrathoracic lymph nodes: Secondary | ICD-10-CM | POA: Diagnosis not present

## 2019-04-24 DIAGNOSIS — I700 Atherosclerosis of aorta: Secondary | ICD-10-CM

## 2019-04-24 DIAGNOSIS — K589 Irritable bowel syndrome without diarrhea: Secondary | ICD-10-CM

## 2019-04-24 DIAGNOSIS — E039 Hypothyroidism, unspecified: Secondary | ICD-10-CM | POA: Diagnosis not present

## 2019-04-24 DIAGNOSIS — K2100 Gastro-esophageal reflux disease with esophagitis, without bleeding: Secondary | ICD-10-CM

## 2019-04-24 DIAGNOSIS — F411 Generalized anxiety disorder: Secondary | ICD-10-CM

## 2019-04-24 NOTE — Patient Instructions (Signed)
Avoid breads and milk products.  Get back on probiotic  Will refer to GI for evaluation for possible small bowel overgrowth syndrome, etc.    Please go to the ER if you have any severe AB pain, unable to hold down food/water, blood in stool or vomit, chest pain, shortness of breath, or any worsening symptoms.   Consider keeping a food diary- common causes are dairy, certain carbs...  FODMAP stands for fermentable oligo-, di-, mono-saccharides and polyols (1). These are the scientific terms used to classify groups of carbs that are notorious for triggering digestive symptoms like bloating, gas and stomach pain.   FODMAPs are found in a wide range of foods in varying amounts. Some foods contain just one type, while others contain several.  The main dietary sources of the four groups of FODMAPs include:  Oligosaccharides: Wheat, rye, legumes and various fruits and vegetables, such as garlic and onions.  Disaccharides: Milk, yogurt and soft cheese. Lactose is the main carb.  Monosaccharides: Various fruit including figs and mangoes, and sweeteners such as honey and agave nectar. Fructose is the main carb.  Polyols: Certain fruits and vegetables including blackberries and lychee, as well as some low-calorie sweeteners like those in sugar-free gum.   Keep a food diary. This will help you identify foods that cause symptoms. Write down: ? What you eat and when. ? What symptoms you have. ? When symptoms occur in relation to your meals.  Avoid foods that cause symptoms. Talk with your dietitian about other ways to get the same nutrients that are in these foods.  Eat your meals slowly, in a relaxed setting.  Aim to eat 5-6 small meals per day. Do not skip meals.  Drink enough fluids to keep your urine clear or pale yellow.  Ask your health care provider if you should take an over-the-counter probiotic during flare-ups to help restore healthy gut bacteria.  If you have cramping or  diarrhea, try making your meals low in fat and high in carbohydrates. Examples of carbohydrates are pasta, rice, whole grain breads and cereals, fruits, and vegetables.  If dairy products cause your symptoms to flare up, try eating less of them. You might be able to handle yogurt better than other dairy products because it contains bacteria that help with digestion.

## 2019-04-25 ENCOUNTER — Telehealth: Payer: Self-pay

## 2019-04-25 NOTE — Telephone Encounter (Signed)
-----   Message from Vicie Mutters, [place] sent at 04/25/2019 11:56 AM EDT ----- Regarding: RE: med direction Contact: [phone] 1 pill twice a day Estill Bamberg ----- Message ----- From: Elenor Quinones, CMA Sent: 04/25/2019  10:42 AM EDT To: Vicie Mutters, PA-C Subject: med direction                                  Was given samples of AMITRIZA but is unsure of how she should take it. Please advise.

## 2019-04-25 NOTE — Telephone Encounter (Signed)
Patient has been made aware of direction

## 2019-04-26 LAB — CBC WITH DIFFERENTIAL/PLATELET
Absolute Monocytes: 625 cells/uL (ref 200–950)
Basophils Absolute: 50 cells/uL (ref 0–200)
Basophils Relative: 1 %
Eosinophils Absolute: 150 cells/uL (ref 15–500)
Eosinophils Relative: 3 %
HCT: 43 % (ref 35.0–45.0)
Hemoglobin: 14 g/dL (ref 11.7–15.5)
Lymphs Abs: 1115 cells/uL (ref 850–3900)
MCH: 29 pg (ref 27.0–33.0)
MCHC: 32.6 g/dL (ref 32.0–36.0)
MCV: 89.2 fL (ref 80.0–100.0)
MPV: 10 fL (ref 7.5–12.5)
Monocytes Relative: 12.5 %
Neutro Abs: 3060 cells/uL (ref 1500–7800)
Neutrophils Relative %: 61.2 %
Platelets: 243 10*3/uL (ref 140–400)
RBC: 4.82 10*6/uL (ref 3.80–5.10)
RDW: 12.6 % (ref 11.0–15.0)
Total Lymphocyte: 22.3 %
WBC: 5 10*3/uL (ref 3.8–10.8)

## 2019-04-26 LAB — COMPLETE METABOLIC PANEL WITH GFR
AG Ratio: 2.6 (calc) — ABNORMAL HIGH (ref 1.0–2.5)
ALT: 25 U/L (ref 6–29)
AST: 32 U/L (ref 10–35)
Albumin: 4.4 g/dL (ref 3.6–5.1)
Alkaline phosphatase (APISO): 48 U/L (ref 37–153)
BUN: 13 mg/dL (ref 7–25)
CO2: 27 mmol/L (ref 20–32)
Calcium: 9.8 mg/dL (ref 8.6–10.4)
Chloride: 102 mmol/L (ref 98–110)
Creat: 0.88 mg/dL (ref 0.60–0.93)
GFR, Est African American: 74 mL/min/{1.73_m2} (ref >=60)
GFR, Est Non African American: 64 mL/min/{1.73_m2} (ref >=60)
Globulin: 1.7 g/dL (calc) — ABNORMAL LOW (ref 1.9–3.7)
Glucose, Bld: 86 mg/dL (ref 65–99)
Potassium: 4.1 mmol/L (ref 3.5–5.3)
Sodium: 138 mmol/L (ref 135–146)
Total Bilirubin: 0.5 mg/dL (ref 0.2–1.2)
Total Protein: 6.1 g/dL (ref 6.1–8.1)

## 2019-04-26 LAB — TISSUE TRANSGLUTAMINASE, IGG: (tTG) Ab, IgG: 1 U/mL

## 2019-04-26 LAB — CELIAC DISEASE COMPREHENSIVE PANEL WITH REFLEXES
(tTG) Ab, IgA: 1 U/mL
Immunoglobulin A: 32 mg/dL — ABNORMAL LOW (ref 70–320)

## 2019-05-09 NOTE — Therapy (Signed)
[place], [place], [address] Phone: [phone]   Fax:  [phone]  Physical Therapy Treatment/Discharge  Patient Details  Name: Shannon Parsons MRN: [id number] Date of Birth: 1941-04-28 Referring Provider (PT): Vicie Mutters, [place]   Encounter Date: 04/11/2019    Past Medical History:  Diagnosis Date  . Adenomatous colon polyp 1994  . Asthma   . Cataract 2013   bilateral eyes  . Diverticulosis   . Family history of ischemic heart disease   . Fibrocystic breast disease   . GERD (gastroesophageal reflux disease)   . Hiatal hernia   . Hyperlipidemia   . Hypertension    pt denies - 01/16/18  . IBS (irritable bowel syndrome)   . Internal hemorrhoids   . Mantle cell lymphoma ([place]) 10/18/2014  . Melanoma ([place])    Facial, pt denies on 01/16/18  . OSA (obstructive sleep apnea)   . Osteoarthritis   . Osteoporosis   . Prediabetes   . Unspecified hypothyroidism   . Vitamin D deficiency     Past Surgical History:  Procedure Laterality Date  . APPENDECTOMY    . BUNIONECTOMY    . CATARACT EXTRACTION, BILATERAL    . KNEE ARTHROSCOPY    . TONSILLECTOMY    . UMBILICAL HERNIA REPAIR    . VIDEO BRONCHOSCOPY Bilateral 10/12/2017   Procedure: VIDEO BRONCHOSCOPY WITHOUT FLUORO;  Surgeon: Tanda Rockers, MD;  Location: [facility];  Service: Endoscopy;  Laterality: Bilateral;    There were no vitals filed for this visit.                                 PT Long Term Goals - 03/21/19 1527      PT LONG TERM GOAL #1   Title  Independent with HEP    Baseline  needs HEP    Time  6    Period  Weeks    Status  New    Target Date  05/02/19      PT LONG TERM GOAL #2   Title  Tolerate standing periods at least 20-30 minutes while cooking, doing chores with pain 2/10 or less    Baseline  5/10, difficulty tolerating due to pain    Time  6    Period  Weeks    Status  New    Target Date  05/02/19      PT LONG TERM GOAL #3   Title  Improve FOTO score to 28% or less impairment    Baseline  33% limited    Time  6    Period  Weeks    Status  New    Target Date  05/02/19      PT LONG TERM GOAL #4   Title  Perform reaching activities for grocery shopping and cleaning at home without limitation due to neck/thoracic pain    Baseline  ability limited    Time  6    Period  Weeks    Status  Achieved    Target Date  05/02/19      PT LONG TERM GOAL #5   Title  --    Time  --    Period  --    Status  --    Target Date  --              Patient will benefit from skilled therapeutic intervention in order to  improve the following deficits and impairments:  Pain, Impaired UE functional use, Increased muscle spasms, Decreased activity tolerance, Decreased strength, Postural dysfunction  Visit Diagnosis: Pain in thoracic spine  Cervicalgia     Problem List Patient Active Problem List   Diagnosis Date Noted  . Hypothyroidism 10/04/2018  . Abnormal glucose 10/04/2018  . Labile hypertension 10/04/2018  . Cervical dystonia 01/16/2018  . Sinusitis, maxillary, chronic 10/19/2017  . Atherosclerosis of aorta ([place]) 08/09/2017  . Tremor 05/06/2016  . Acquired hypogammaglobulinemia ([place]) 04/05/2016  . Obstructive bronchiectasis ([place]) 03/18/2016  . Body mass index (BMI) of 20.0-20.9 in adult 07/21/2015  . History of DVT (deep vein thrombosis)   . Normocytic anemia 01/04/2015  . Mantle cell lymphoma ([place]) 10/18/2014  . Medication management 07/15/2014  . OSA and COPD overlap syndrome ([place]) 07/05/2014  . Anxiety state 02/27/2014  . Hyperlipidemia, mixed   . Hypotension   . GERD   . Osteoporosis  (Refuses treatment)   . Osteoarthritis   . Vitamin D deficiency   . IBS   . Diverticulosis   . History of colonic polyps 07/19/2011  . Chronic obstructive airway disease with asthma ([place]) 03/16/2011      PHYSICAL THERAPY DISCHARGE SUMMARY  Visits from  Start of Care: 2  Current functional level related to goals / functional outcomes: Patient did not return for further therapy after last session 04/11/19-she had noted improvement and planned to continue with HEP due to concerns about Covid risk factors with in person visits.   Remaining deficits: NA   Education / Equipment: NA Plan: Patient agrees to discharge.  Patient goals were partially met. Patient is being discharged due to the patient's request.  ?????          Beaulah Dinning, PT, DPT 05/09/19 1:33 PM    [facility] [address] [place], [place], [address] Phone: [phone]   Fax:  [phone]  Name: Shannon Parsons MRN: [id number] Date of Birth: 1941/04/26

## 2019-05-31 ENCOUNTER — Other Ambulatory Visit: Payer: Self-pay | Admitting: Physician Assistant

## 2019-05-31 MED ORDER — ROSUVASTATIN CALCIUM 10 MG PO TABS: ORAL_TABLET | ORAL | 3 refills | Status: DC

## 2019-06-07 ENCOUNTER — Encounter: Payer: Self-pay | Admitting: Gastroenterology

## 2019-06-07 ENCOUNTER — Ambulatory Visit: Payer: PPO | Admitting: Gastroenterology

## 2019-06-07 VITALS — BP 106/60 | HR 70 | Temp 98.7°F | Ht 64.0 in | Wt 118.0 lb

## 2019-06-07 DIAGNOSIS — R140 Abdominal distension (gaseous): Secondary | ICD-10-CM | POA: Diagnosis not present

## 2019-06-07 DIAGNOSIS — K5900 Constipation, unspecified: Secondary | ICD-10-CM

## 2019-06-07 NOTE — Progress Notes (Signed)
    History of Present Illness: This is a 78 year old female who relates difficulties with constipation and straining.  She does not feel like her bowels are completely emptying although she typically has a bowel movement each day.  She often sits and strains for 20 to 30 minutes before bowel movement.  She has noted some bloating and generalized abdominal discomfort.  She is tried MiraLAX intermittently but not on a regular, scheduled basis.  Hyoscyamine is not completely reducing the bloating and discomfort.  Current Medications, Allergies, Past Medical History, Past Surgical History, Family History and Social History were reviewed in [employer] record.   Physical Exam: General: Well developed, well nourished, no acute distress Head: Normocephalic and atraumatic Eyes:  sclerae anicteric, EOMI Ears: Normal auditory acuity Mouth: No deformity or lesions Lungs: Clear throughout to auscultation Heart: Regular rate and rhythm; no murmurs, rubs or bruits Abdomen: Soft, non tender and non distended. No masses, hepatosplenomegaly or hernias noted. Normal Bowel sounds Rectal: Not done Musculoskeletal: Symmetrical with no gross deformities  Pulses:  Normal pulses noted Extremities: No clubbing, cyanosis, edema or deformities noted Neurological: Alert oriented x 4, grossly nonfocal Psychological:  Alert and cooperative. Normal mood and affect   Assessment and Recommendations:  1. Constipation. Abdominal bloating and discomfort.  Increase daily water and fiber intake.  Begin MiraLAX daily on a regular, scheduled basis and not as needed.  If stools are too loose or too frequent can reduce the daily dose or dose every other day.  If symptoms not resolved consider further evaluation with CT AP and to R/o SIBO. REV in 1 month.

## 2019-06-07 NOTE — Patient Instructions (Signed)
Start over the counter Miralax mixing 17 grams in 8 oz of water daily. You can decrease to every other day if needed.   Thank you for choosing me and [facility].  Pricilla Riffle. Dagoberto Ligas., MD., [facility]

## 2019-06-08 ENCOUNTER — Other Ambulatory Visit: Payer: Self-pay

## 2019-06-08 ENCOUNTER — Inpatient Hospital Stay: Payer: PPO | Attending: Hematology & Oncology

## 2019-06-08 VITALS — BP 131/77 | HR 79 | Temp 97.7°F | Resp 20 | Wt 119.5 lb

## 2019-06-08 DIAGNOSIS — C8310 Mantle cell lymphoma, unspecified site: Secondary | ICD-10-CM | POA: Diagnosis not present

## 2019-06-08 DIAGNOSIS — Z95828 Presence of other vascular implants and grafts: Secondary | ICD-10-CM

## 2019-06-08 DIAGNOSIS — D801 Nonfamilial hypogammaglobulinemia: Secondary | ICD-10-CM | POA: Insufficient documentation

## 2019-06-08 DIAGNOSIS — Z79899 Other long term (current) drug therapy: Secondary | ICD-10-CM | POA: Insufficient documentation

## 2019-06-08 MED ORDER — SODIUM CHLORIDE 0.9% FLUSH
10.0000 mL | INTRAVENOUS | Status: DC | PRN
  Administered 2019-06-08: 10 mL via INTRAVENOUS
  Filled 2019-06-08: qty 10

## 2019-06-08 MED ORDER — HEPARIN SOD (PORK) LOCK FLUSH 100 UNIT/ML IV SOLN
500.0000 [IU] | Freq: Once | INTRAVENOUS | Status: AC
  Administered 2019-06-08: 13:00:00 500 [IU] via INTRAVENOUS
  Filled 2019-06-08: qty 5

## 2019-06-12 ENCOUNTER — Other Ambulatory Visit: Payer: Self-pay | Admitting: Gastroenterology

## 2019-07-17 NOTE — Progress Notes (Signed)
FOLLOW UP  Assessment and Plan:   Chronic obstructive airway disease with asthma (HCC) Symptoms stable, continue PRN inhalers Continue follow up pulmonary  Atherosclerosis of aorta Control blood pressure, cholesterol, glucose, increase exercise.   Hypotension Monitor blood pressure at home; patient to call if consistently greater than 130/80 or below 90/60  Continue DASH diet.   Reminder to go to the ER if any CP, SOB, nausea, dizziness, severe HA, changes vision/speech, left arm numbness and tingling and jaw pain.  Cholesterol Currently above goal; rosuvastatin 2.5 mg daily- OR CAN DO 5 MG EVERYOTHER DAY Continue low cholesterol diet and exercise.  Check lipid panel.   Prediabetes Continue diet and exercise.  Perform daily foot/skin check, notify office of any concerning changes.  Check A1C  BMI 21  Continue to recommend diet heavy in fruits and veggies and low in animal meats, cheeses, and dairy products, appropriate calorie intake Discuss exercise recommendations routinely Continue to monitor weight at each visit  Hypothyroidism continue medications the same pending lab results reminded to take on an empty stomach 30-33mins before food.  check TSH level  Vitamin D Def Near goal at last visit; on high dose (10000 IU daily)  continue supplementation  Defer Vit D level  Continue diet and meds as discussed. Further disposition pending results of labs. Discussed med's effects and SE's.   Over 30 minutes of exam, counseling, chart review, and critical decision making was performed.   Future Appointments  Date Time Provider [place]  08/01/2019 11:30 AM [facility] LAB [facility] None  08/01/2019 11:45 AM [facility] INJ NURSE [facility] None  08/01/2019 12:30 PM Ennever, Rudell Cobb, MD [facility] None  10/29/2019 10:00 AM Unk Pinto, MD [facility] None  01/30/2020 11:15 AM Vicie Mutters, PA-C [facility] None     ----------------------------------------------------------------------------------------------------------------------  HPI 78 y.o. female  presents for 3 month follow up on hypertension, cholesterol, prediabetes, hypothyroid, weight and vitamin D deficiency.   Patient was diagnosed with Mantle cell Lymphoma in Jan 2016 with induced remission by Sept 2016 with Chemoradiation per Dr Marin Olp and had been on maintainance through Nov 2018, follows with him every 4 months. She also is followed by Dr Melvyn Novas for recurrent pulmonary infections, with diagnosis of obstructive bronchiectasis and asthma.   She has increased stress with her husband, has possible dementia.   She was seen by Dr. Fuller Plan for constipation, states she is doing better. Will follow up if she is not doing better. States she is having complete BM's lately, no issues.   BMI is Body mass index is 20.25 kg/m., she has been working on diet and exercise, walks daily outdoors, aims for 30 min daily as tolerated, and video programs. She drinks 3 bottles of water daily.  Wt Readings from Last 3 Encounters:  07/19/19 118 lb (53.5 kg)  06/08/19 119 lb 8 oz (54.2 kg)  06/07/19 118 lb (53.5 kg)   Her blood pressure has been controlled at home, today their BP is BP: 118/72  She does workout. She denies chest pain, shortness of breath, dizziness.   She is on cholesterol medication Rosuvastatin 2.5 mg daily and denies myalgias. Her cholesterol is not at goal. The cholesterol last visit was:   Lab Results  Component Value Date   CHOL 186 04/17/2019   HDL 62 04/17/2019   LDLCALC 103 (H) 04/17/2019   TRIG 113 04/17/2019   CHOLHDL 3.0 04/17/2019    She has been working on diet and exercise for prediabetes, and denies foot ulcerations, increased appetite,  nausea, paresthesia of the feet, polydipsia, polyuria and visual disturbances. Last A1C in the office was:  Lab Results  Component Value Date   HGBA1C 5.6 04/17/2019   She is on thyroid  medication. Her medication was not changed last visit.   Lab Results  Component Value Date   TSH 2.29 04/17/2019   Patient is on Vitamin D supplement 10000 IU daily  Lab Results  Component Value Date   VD25OH 69 04/17/2019        Current Medications:  Current Outpatient Medications on File Prior to Visit  Medication Sig  . acetaminophen (TYLENOL) 325 MG tablet Take 325 mg by mouth every 6 (six) hours as needed for moderate pain (For pain.).   [name] amoxicillin (AMOXIL) 500 MG capsule TAKE 4 CAPSULES BY MOUTH ONE HOUR BEFORE DENTAL PROCEDURE.  [name] Cholecalciferol (VITAMIN D3) 5000 units CAPS Take 8,000 Units by mouth daily.   . diazepam (VALIUM) 5 MG tablet Take 5 mg by mouth every 6 (six) hours as needed for anxiety. Pt takes 2.5 mg as needed for sleep  . hyoscyamine (LEVSIN SL) 0.125 MG SL tablet TAKE 1 TO 2 TABLETS BY MOUTH EVERY 4 HOURS AS NEEDED FOR NAUSEA, CRAMPING, BLOATING OR DIARRHEA  . ipratropium-albuterol (DUONEB) 0.5-2.5 (3) MG/3ML SOLN Take 3 mLs by nebulization every 4 (four) hours as needed (heaviness in chest).   [name] levothyroxine (SYNTHROID, LEVOTHROID) 50 MCG tablet TAKE 1 TABLET BY MOUTH ONCE DAILY.  [name] lidocaine-prilocaine (EMLA) cream APPLY TO PAC 1 HOUR BEFORE ACCESS.  [name] LORazepam (ATIVAN) 0.5 MG tablet Take 1 tablet (0.5 mg total) by mouth every 6 (six) hours as needed (Nausea or vomiting).  [name] OVER THE COUNTER MEDICATION Takes Super C Complex 1 daily.  [name] PROAIR HFA 108 (90 Base) MCG/ACT inhaler INHALE 2 PUFFS EVERY 4 HOURS AS NEEDED INTO THE LUNGS FOR WHEEZING OR SHORTNESS OF BREATH.  . Probiotic Product (PROBIOTIC DAILY PO) Take 1 capsule by mouth daily.  [name] Respiratory Therapy Supplies (FLUTTER) DEVI Take as directed  . rosuvastatin (CRESTOR) 10 MG tablet 1/4 tablet every day  . polyethylene glycol (MIRALAX / GLYCOLAX) 17 g packet Take 17 g by mouth daily. (Patient not taking: Reported on 07/19/2019)   Current Facility-Administered Medications on File Prior to Visit   Medication  . ipratropium-albuterol (DUONEB) 0.5-2.5 (3) MG/3ML nebulizer solution 3 mL     Allergies:  Allergies  Allergen Reactions  . Alendronate Other (See Comments)    "made me choke"    . Risedronate Sodium Other (See Comments)    "made me choke"    . Sulfa Antibiotics Other (See Comments)    Doesn't remember   . Sulfasalazine Other (See Comments)    Doesn't remember   . Other Rash  . Tetracyclines & Related Rash     Medical History:  Past Medical History:  Diagnosis Date  . Adenomatous colon polyp 1994  . Asthma   . Cataract 2013   bilateral eyes  . Diverticulosis   . Family history of ischemic heart disease   . Fibrocystic breast disease   . GERD (gastroesophageal reflux disease)   . Hiatal hernia   . Hyperlipidemia   . Hypertension    pt denies - 01/16/18  . IBS (irritable bowel syndrome)   . Internal hemorrhoids   . Mantle cell lymphoma ([place]) 10/18/2014  . Melanoma ([place])    Facial, pt denies on 01/16/18  . OSA (obstructive sleep apnea)   . Osteoarthritis   . Osteoporosis   .  Prediabetes   . Unspecified hypothyroidism   . Vitamin D deficiency    Family history- Reviewed and unchanged Social history- Reviewed and unchanged   Review of Systems:  Review of Systems  Constitutional: Negative for malaise/fatigue and weight loss.  HENT: Negative for hearing loss and tinnitus.   Eyes: Negative for blurred vision and double vision.  Respiratory: Negative for cough, shortness of breath and wheezing.   Cardiovascular: Negative for chest pain, palpitations, orthopnea, claudication and leg swelling.  Gastrointestinal: Negative for abdominal pain, blood in stool, constipation, diarrhea, heartburn, melena, nausea and vomiting.  Genitourinary: Negative.   Musculoskeletal: Negative for joint pain and myalgias.  Skin: Negative for rash.  Neurological: Negative for dizziness, tingling, sensory change, weakness and headaches.  Endo/Heme/Allergies: Negative for  polydipsia.  Psychiatric/Behavioral: Negative.   All other systems reviewed and are negative.    Physical Exam: BP 118/72   Temp 97.6 F (36.4 C)   Wt 118 lb (53.5 kg)   LMP 09/14/2014   BMI 20.25 kg/m  Wt Readings from Last 3 Encounters:  07/19/19 118 lb (53.5 kg)  06/08/19 119 lb 8 oz (54.2 kg)  06/07/19 118 lb (53.5 kg)   General Appearance: Frail appearing elder, in no apparent distress. Eyes: PERRLA, EOMs, conjunctiva no swelling or erythema Sinuses: No Frontal/maxillary tenderness ENT/Mouth: Ext aud canals clear, TMs without erythema, bulging. No erythema, swelling, or exudate on post pharynx.  Tonsils not swollen or erythematous. Hearing normal.  Neck: Supple, thyroid normal.  Respiratory: Respiratory effort normal, BS equal bilaterally without rales, rhonchi, wheezing or stridor.  Cardio: RRR with no MRGs. Brisk peripheral pulses without edema.  Abdomen: Soft, + BS.  Non tender, no guarding, rebound, hernias, masses. Lymphatics: Non tender without lymphadenopathy.  Musculoskeletal: Full ROM, 5/5 strength, Normal gait Skin: Warm, dry without rashes, lesions, ecchymosis.  Neuro: Cranial nerves intact. No cerebellar symptoms.  Psych: Awake and oriented X 3, normal affect, Insight and Judgment appropriate.    Vicie Mutters, PA-C 10:58 AM [facility]

## 2019-07-19 ENCOUNTER — Other Ambulatory Visit: Payer: Self-pay

## 2019-07-19 ENCOUNTER — Ambulatory Visit (INDEPENDENT_AMBULATORY_CARE_PROVIDER_SITE_OTHER): Payer: PPO | Admitting: Physician Assistant

## 2019-07-19 ENCOUNTER — Encounter: Payer: Self-pay | Admitting: Physician Assistant

## 2019-07-19 VITALS — BP 118/72 | Temp 97.6°F | Wt 118.0 lb

## 2019-07-19 DIAGNOSIS — C8312 Mantle cell lymphoma, intrathoracic lymph nodes: Secondary | ICD-10-CM | POA: Diagnosis not present

## 2019-07-19 DIAGNOSIS — J4489 Other specified chronic obstructive pulmonary disease: Secondary | ICD-10-CM

## 2019-07-19 DIAGNOSIS — J449 Chronic obstructive pulmonary disease, unspecified: Secondary | ICD-10-CM | POA: Diagnosis not present

## 2019-07-19 DIAGNOSIS — E039 Hypothyroidism, unspecified: Secondary | ICD-10-CM

## 2019-07-19 DIAGNOSIS — G4733 Obstructive sleep apnea (adult) (pediatric): Secondary | ICD-10-CM | POA: Diagnosis not present

## 2019-07-19 DIAGNOSIS — I959 Hypotension, unspecified: Secondary | ICD-10-CM | POA: Diagnosis not present

## 2019-07-19 DIAGNOSIS — J479 Bronchiectasis, uncomplicated: Secondary | ICD-10-CM | POA: Diagnosis not present

## 2019-07-19 DIAGNOSIS — R0989 Other specified symptoms and signs involving the circulatory and respiratory systems: Secondary | ICD-10-CM

## 2019-07-19 DIAGNOSIS — R7309 Other abnormal glucose: Secondary | ICD-10-CM

## 2019-07-19 DIAGNOSIS — Z79899 Other long term (current) drug therapy: Secondary | ICD-10-CM | POA: Diagnosis not present

## 2019-07-19 DIAGNOSIS — D801 Nonfamilial hypogammaglobulinemia: Secondary | ICD-10-CM | POA: Diagnosis not present

## 2019-07-19 DIAGNOSIS — I700 Atherosclerosis of aorta: Secondary | ICD-10-CM

## 2019-07-19 DIAGNOSIS — E782 Mixed hyperlipidemia: Secondary | ICD-10-CM | POA: Diagnosis not present

## 2019-07-19 DIAGNOSIS — E559 Vitamin D deficiency, unspecified: Secondary | ICD-10-CM | POA: Diagnosis not present

## 2019-07-19 NOTE — Patient Instructions (Addendum)
Alzheimer Disease Caregiver Guide  Alzheimer disease is a brain disease that causes memory loss and changes in behavior. People with Alzheimer disease often have problems paying attention, communicating, and doing routine tasks. The disease gets worse over time, and people with the disease eventually need full-time care. Taking care of someone with Alzheimer disease can be challenging and overwhelming. This guide provides helpful information and tips that can make caring for someone with the disease a little easier. What changes does Alzheimer disease cause? Alzheimer disease causes a person to lose the ability to remember things and make decisions. Memory loss and confusion are usually mild at the start of the disease, but they get more severe over time. Eventually, the person may not recognize friends, family members, or familiar places. Alzheimer disease can also cause hallucinations, changes in behavior, and changes in mood, such as anxiety or anger. The changes can come on suddenly. They may happen in response to something such as: Pain. An infection. Changes in environment, such as changes in temperature or noise. Overstimulation. Feeling lost or scared. Tips for managing symptoms Be calm and patient. Give short, simple answers to questions. Long answers can overwhelm and confuse the person. Avoid correcting the person in a negative way. Try not to take things personally, even if the person forgets your name. Understand that changes are a part of the disease process. Do not argue or try to convince the person about a specific point. Doing that may make the person feel more agitated. Tips for reducing frustration Make appointments and do daily tasks, like bathing and dressing, when the person is at his or her best. Allow for plenty of time for simple tasks because they may take longer than expected. Take your time when doing these tasks. Limit the person's choices. Too many choices can be  overwhelming and stressful for the person. Involve the person in what you are doing. Keep a daily routine. Avoid crowds and new situations, if possible. Use simple words, short sentences, and a calm voice. Only give one direction at a time. Buy clothes and shoes that are easy to put on and take off. Organize medications in a pillbox for each day of the week. Keep a calendar in a central location to remind the person of appointments or other activities. Ask about respite care resources so that you can have a regular break from the stress of caregiving. Tips for reducing the risk of injury Keep floors clear of clutter. Remove rugs, magazine racks, and floor lamps. Keep hallways well-lit, especially at night. Put a handrail and nonslip mat in the bathtub or shower. Put childproof locks on cabinets that contain dangerous items, such as medicines, alcohol, guns, toxic cleaning items, sharp tools or utensils, matches, and lighters. Put locks on doors. Put the locks in places where the person cannot see or reach them easily. This will help ensure that the person does not wander out of the house and get lost. Be prepared for emergencies. Keep a list of emergency phone numbers and addresses in a convenient area. Remove car keys and lock garage doors so that the person does not try to get in the car and drive. A certain type of bracelet may be worn that tracks a person's location and identifies him or her as having memory problems. This should be worn at all times for safety. Tips for future planning Discuss financial and legal planning early on in the course of the disease. People with Alzheimer disease will have trouble  managing their money as the disease gets worse. Get help from professional advisers regarding financial and legal matters. Discuss advance directives, safety, and daily care. Take these steps: Create a living will and choose a power of attorney. The person with power of attorney will be  able to make decisions for the person with Alzheimer disease when he or she is no longer able to. Discuss driving safety and when to stop driving. The person's health care provider can help provide assistance with this decision. Discuss the person's living situation. If the person lives alone, make sure he or she is safe. People who live at home may need extra help from home health caregivers, and those who live in a nursing home or care center may need more care. Where to find support One way to find support is to join a local support group. Advantages of being part of a support group include: Learning strategies to manage stress. Sharing experiences with others. Receiving emotional comfort and support. Learning about caregiving as the disease progresses. Knowing what community resources are available and making use of them. Where to find more information Alzheimer's Association: [employer] Contact a health care provider if: The person has a fever. The person has a sudden change in behavior that does not improve with calming strategies. The person is unable to manage in his or her current living situation. The person threatens himself or herself, you, or anyone else. You are no longer able to care for the person. Summary Alzheimer disease is a brain disease that causes memory loss and changes in behavior. People with Alzheimer disease often have problems paying attention, communicating, and doing routine tasks. The disease gets worse over time, and people with the disease eventually need full-time care. Take steps to reduce the person's risk of injury, and plan for future care. Taking care of someone with Alzheimer disease can be very challenging and overwhelming. One way to find support during this time is to join a local support group. This information is not intended to replace advice given to you by your health care provider. Make sure you discuss any questions you have with your health  care provider. Document Released: 05/11/2004 Document Revised: 12/19/2018 Document Reviewed: 10/01/2016 Elsevier Patient Education  [place]. 3 Document Reviewed: 01/25/2011  [facility] Patient Information 2014 [place].   SUBLINGUAL B12- MELTS IN HIS MOUTH  Vitamin B12 Deficiency Vitamin B12 deficiency occurs when the body does not have enough vitamin B12, which is an important vitamin. The body needs this vitamin:  To make red blood cells.  To make DNA. This is the genetic material inside cells.  To help the nerves work properly so they can carry messages from the brain to the body. Vitamin B12 deficiency can cause various health problems, such as a low red blood cell count (anemia) or nerve damage. What are the causes? This condition may be caused by:  Not eating enough foods that contain vitamin B12.  Not having enough stomach acid and digestive fluids to properly absorb vitamin B12 from the food that you eat.  Certain digestive system diseases that make it hard to absorb vitamin B12. These diseases include Crohn's disease, chronic pancreatitis, and cystic fibrosis.  A condition in which the body does not make enough of a protein (intrinsic factor), resulting in too few red blood cells (pernicious anemia).  Having a surgery in which part of the stomach or small intestine is removed.  Taking certain medicines that make it hard for the  body to absorb vitamin B12. These medicines include: ? Heartburn medicines (antacids and proton pump inhibitors). ? Certain antibiotic medicines. ? Some medicines that are used to treat diabetes, tuberculosis, gout, or high cholesterol. What increases the risk? The following factors may make you more likely to develop a B12 deficiency:  Being older than age 50.  Eating a vegetarian or vegan diet, especially while you are pregnant.  Eating a poor diet while you are pregnant.  Taking certain medicines.  Having alcoholism.  What are the signs or symptoms? In some cases, there are no symptoms of this condition. If the condition leads to anemia or nerve damage, various symptoms can occur, such as:  Weakness.  Fatigue.  Loss of appetite.  Weight loss.  Numbness or tingling in your hands and feet.  Redness and burning of the tongue.  Confusion or memory problems.  Depression.  Sensory problems, such as color blindness, ringing in the ears, or loss of taste.  Diarrhea or constipation.  Trouble walking. If anemia is severe, symptoms can include:  Shortness of breath.  Dizziness.  Rapid heart rate (tachycardia). How is this diagnosed? This condition may be diagnosed with a blood test to measure the level of vitamin B12 in your blood. You may also have other tests, including:  A group of tests that measure certain characteristics of blood cells (complete blood count, CBC).  A blood test to measure intrinsic factor.  A procedure where a thin tube with a camera on the end is used to look into your stomach or intestines (endoscopy). Other tests may be needed to discover the cause of B12 deficiency. How is this treated? Treatment for this condition depends on the cause. This condition may be treated by:  Changing your eating and drinking habits, such as: ? Eating more foods that contain vitamin B12. ? Drinking less alcohol or no alcohol.  Getting vitamin B12 injections.  Taking vitamin B12 supplements. Your health care provider will tell you which dosage is best for you. Follow these instructions at home: Eating and drinking   Eat lots of healthy foods that contain vitamin B12, including: ? Meats and poultry. This includes beef, pork, chicken, [place], and organ meats, such as liver. ? Seafood. This includes clams, rainbow trout, salmon, tuna, and haddock. ? Eggs. ? Cereal and dairy products that are fortified. This means that vitamin B12 has been added to the food. Check the label on the  package to see if the food is fortified. The items listed above may not be a complete list of recommended foods and beverages. Contact a dietitian for more information. General instructions  Get any injections that are prescribed by your health care provider.  Take supplements only as told by your health care provider. Follow the directions carefully.  Do not drink alcohol if your health care provider tells you not to. In some cases, you may only be asked to limit alcohol use.  Keep all follow-up visits as told by your health care provider. This is important. Contact a health care provider if:  Your symptoms come back. Get help right away if you:  Develop shortness of breath.  Have a rapid heart rate.  Have chest pain.  Become dizzy or lose consciousness. Summary  Vitamin B12 deficiency occurs when the body does not have enough vitamin B12.  The main causes of vitamin B12 deficiency include dietary deficiency, digestive diseases, pernicious anemia, and having a surgery in which part of the stomach or small intestine is  removed.  In some cases, there are no symptoms of this condition. If the condition leads to anemia or nerve damage, various symptoms can occur, such as weakness, shortness of breath, and numbness.  Treatment may include getting vitamin B12 injections or taking vitamin B12 supplements. Eat lots of healthy foods that contain vitamin B12. This information is not intended to replace advice given to you by your health care provider. Make sure you discuss any questions you have with your health care provider. Document Released: 11/22/2011 Document Revised: 05/09/2018 Document Reviewed: 05/09/2018 Elsevier Patient Education  2020 [employer].

## 2019-07-21 LAB — LIPID PANEL
Cholesterol: 174 mg/dL (ref <200)
HDL: 60 mg/dL (ref >=50)
LDL Cholesterol (Calc): 91 mg/dL (calc)
Non-HDL Cholesterol (Calc): 114 mg/dL (calc) (ref <130)
Total CHOL/HDL Ratio: 2.9 (calc) (ref <5.0)
Triglycerides: 125 mg/dL (ref <150)

## 2019-07-21 LAB — ZINC: Zinc: 88 ug/dL (ref 60–130)

## 2019-07-21 LAB — TSH: TSH: 1.61 mIU/L (ref 0.40–4.50)

## 2019-08-01 ENCOUNTER — Inpatient Hospital Stay (HOSPITAL_BASED_OUTPATIENT_CLINIC_OR_DEPARTMENT_OTHER): Payer: PPO | Admitting: Hematology & Oncology

## 2019-08-01 ENCOUNTER — Inpatient Hospital Stay: Payer: PPO | Attending: Hematology & Oncology

## 2019-08-01 ENCOUNTER — Other Ambulatory Visit: Payer: Self-pay

## 2019-08-01 ENCOUNTER — Encounter: Payer: Self-pay | Admitting: Hematology & Oncology

## 2019-08-01 ENCOUNTER — Inpatient Hospital Stay: Payer: PPO

## 2019-08-01 VITALS — BP 143/82 | HR 100 | Temp 97.3°F | Wt 118.0 lb

## 2019-08-01 DIAGNOSIS — C8310 Mantle cell lymphoma, unspecified site: Secondary | ICD-10-CM | POA: Diagnosis not present

## 2019-08-01 DIAGNOSIS — R5381 Other malaise: Secondary | ICD-10-CM | POA: Insufficient documentation

## 2019-08-01 DIAGNOSIS — Z9225 Personal history of immunosupression therapy: Secondary | ICD-10-CM | POA: Diagnosis not present

## 2019-08-01 DIAGNOSIS — C8312 Mantle cell lymphoma, intrathoracic lymph nodes: Secondary | ICD-10-CM

## 2019-08-01 DIAGNOSIS — F419 Anxiety disorder, unspecified: Secondary | ICD-10-CM | POA: Diagnosis not present

## 2019-08-01 DIAGNOSIS — R5383 Other fatigue: Secondary | ICD-10-CM | POA: Insufficient documentation

## 2019-08-01 DIAGNOSIS — Z9221 Personal history of antineoplastic chemotherapy: Secondary | ICD-10-CM | POA: Insufficient documentation

## 2019-08-01 DIAGNOSIS — D801 Nonfamilial hypogammaglobulinemia: Secondary | ICD-10-CM | POA: Diagnosis not present

## 2019-08-01 DIAGNOSIS — Z95828 Presence of other vascular implants and grafts: Secondary | ICD-10-CM

## 2019-08-01 DIAGNOSIS — Z79899 Other long term (current) drug therapy: Secondary | ICD-10-CM | POA: Insufficient documentation

## 2019-08-01 DIAGNOSIS — J479 Bronchiectasis, uncomplicated: Secondary | ICD-10-CM | POA: Diagnosis not present

## 2019-08-01 LAB — CBC WITH DIFFERENTIAL (CANCER CENTER ONLY)
Abs Immature Granulocytes: 0.01 10*3/uL (ref 0.00–0.07)
Basophils Absolute: 0.1 10*3/uL (ref 0.0–0.1)
Basophils Relative: 1 %
Eosinophils Absolute: 0.2 10*3/uL (ref 0.0–0.5)
Eosinophils Relative: 2 %
HCT: 40.4 % (ref 36.0–46.0)
Hemoglobin: 13.2 g/dL (ref 12.0–15.0)
Immature Granulocytes: 0 %
Lymphocytes Relative: 24 %
Lymphs Abs: 1.5 10*3/uL (ref 0.7–4.0)
MCH: 29.3 pg (ref 26.0–34.0)
MCHC: 32.7 g/dL (ref 30.0–36.0)
MCV: 89.8 fL (ref 80.0–100.0)
Monocytes Absolute: 0.7 10*3/uL (ref 0.1–1.0)
Monocytes Relative: 11 %
Neutro Abs: 3.8 10*3/uL (ref 1.7–7.7)
Neutrophils Relative %: 62 %
Platelet Count: 209 10*3/uL (ref 150–400)
RBC: 4.5 MIL/uL (ref 3.87–5.11)
RDW: 13.7 % (ref 11.5–15.5)
WBC Count: 6.2 10*3/uL (ref 4.0–10.5)
nRBC: 0 % (ref 0.0–0.2)

## 2019-08-01 LAB — CMP (CANCER CENTER ONLY)
ALT: 13 U/L (ref 0–44)
AST: 25 U/L (ref 15–41)
Albumin: 4.5 g/dL (ref 3.5–5.0)
Alkaline Phosphatase: 57 U/L (ref 38–126)
Anion gap: 7 (ref 5–15)
BUN: 16 mg/dL (ref 8–23)
CO2: 28 mmol/L (ref 22–32)
Calcium: 9.3 mg/dL (ref 8.9–10.3)
Chloride: 102 mmol/L (ref 98–111)
Creatinine: 0.91 mg/dL (ref 0.44–1.00)
GFR, Est AFR Am: >60 mL/min (ref >60)
GFR, Estimated: >60 mL/min (ref >60)
Glucose, Bld: 96 mg/dL (ref 70–99)
Potassium: 4.2 mmol/L (ref 3.5–5.1)
Sodium: 137 mmol/L (ref 135–145)
Total Bilirubin: 0.4 mg/dL (ref 0.3–1.2)
Total Protein: 5.7 g/dL — ABNORMAL LOW (ref 6.5–8.1)

## 2019-08-01 MED ORDER — HEPARIN SOD (PORK) LOCK FLUSH 100 UNIT/ML IV SOLN
500.0000 [IU] | Freq: Once | INTRAVENOUS | Status: AC
  Administered 2019-08-01: 500 [IU] via INTRAVENOUS
  Filled 2019-08-01: qty 5

## 2019-08-01 MED ORDER — SODIUM CHLORIDE 0.9% FLUSH
10.0000 mL | INTRAVENOUS | Status: DC | PRN
  Administered 2019-08-01: 10 mL via INTRAVENOUS
  Filled 2019-08-01: qty 10

## 2019-08-01 NOTE — Progress Notes (Signed)
Hematology and Oncology Follow Up Visit  Shannon Parsons [id number] 12/05/1940 78 y.o. 08/01/2019   Principle Diagnosis:   Mantle cell lymphoma (MCIPI = 5)  Thromboembolic event of the right leg  Acquired Hypogammaglobulinemia  Bronchiectasis  Current Therapy:    Rituxan - maintanence - s/p cycle #10 - to finish in 07/2017  IVIG - q  month -restart on 10/27/2017; last dose 01/05/2018     Interim History:  Ms.  Corriea is back for follow-up.  We saw her about 4 months ago.  Since then, she has been doing pretty well.  She is trying to exercise.  She has been very diligent with watching out for the coronavirus.  She is worried about her husband.  Sounds like he may have early Alzheimer's.  Hopefully, this is not the case..  She does have quite a bit of anxiety.  I think she is taking a Xanax for this.  She wanted to know about CBD oil.  I told her I had no problems with her taking this.  She has had no fever.  Her appetite has been good.  She has had no nausea or vomiting.  She has had no change in bowel or bladder habits.  There is been no rashes.    Overall, her performance status is ECOG 1.   Medications:  Current Outpatient Medications:  .  acetaminophen (TYLENOL) 325 MG tablet, Take 325 mg by mouth every 6 (six) hours as needed for moderate pain (For pain.). , Disp: , Rfl:  .  ALPRAZolam (XANAX) 0.5 MG tablet, Take 0.25 mg by mouth at bedtime as needed for anxiety., Disp: , Rfl:  .  Cholecalciferol (VITAMIN D3) 50 MCG (2000 UT) capsule, Take 4,000 Units by mouth. , Disp: , Rfl:  .  diazepam (VALIUM) 5 MG tablet, Take 5 mg by mouth every 6 (six) hours as needed for anxiety. Pt takes 2.5 mg as needed for sleep, Disp: , Rfl:  .  hyoscyamine (LEVSIN SL) 0.125 MG SL tablet, TAKE 1 TO 2 TABLETS BY MOUTH EVERY 4 HOURS AS NEEDED FOR NAUSEA, CRAMPING, BLOATING OR DIARRHEA, Disp: 90 tablet, Rfl: 11 .  levothyroxine (SYNTHROID, LEVOTHROID) 50 MCG tablet, TAKE 1 TABLET BY MOUTH ONCE  DAILY., Disp: 90 tablet, Rfl: 3 .  lidocaine-prilocaine (EMLA) cream, APPLY TO PAC 1 HOUR BEFORE ACCESS., Disp: 30 g, Rfl: 3 .  LORazepam (ATIVAN) 0.5 MG tablet, Take 1 tablet (0.5 mg total) by mouth every 6 (six) hours as needed (Nausea or vomiting)., Disp: 30 tablet, Rfl: 0 .  polyethylene glycol (MIRALAX / GLYCOLAX) 17 g packet, Take 17 g by mouth daily., Disp: 30 each, Rfl: 3 .  rosuvastatin (CRESTOR) 10 MG tablet, 1/4 tablet every day, Disp: 30 tablet, Rfl: 3 .  Zinc 50 MG CAPS, Take by mouth daily., Disp: , Rfl:  .  amoxicillin (AMOXIL) 500 MG capsule, TAKE 4 CAPSULES BY MOUTH ONE HOUR BEFORE DENTAL PROCEDURE. (Patient not taking: Reported on 08/01/2019), Disp: 8 capsule, Rfl: 0 .  PROAIR HFA 108 (90 Base) MCG/ACT inhaler, INHALE 2 PUFFS EVERY 4 HOURS AS NEEDED INTO THE LUNGS FOR WHEEZING OR SHORTNESS OF BREATH. (Patient not taking: Reported on 08/01/2019), Disp: 48 g, Rfl: 1 .  Respiratory Therapy Supplies (FLUTTER) DEVI, Take as directed (Patient not taking: Reported on 08/01/2019), Disp: 1 each, Rfl: 0 No current facility-administered medications for this visit.   Facility-Administered Medications Ordered in Other Visits:  .  ipratropium-albuterol (DUONEB) 0.5-2.5 (3) MG/3ML nebulizer solution 3 mL, 3  mL, Nebulization, Q6H, [place], Holli Humbles, NP  Allergies:  Allergies  Allergen Reactions  . Alendronate Other (See Comments)    "made me choke"    . Risedronate Sodium Other (See Comments)    "made me choke"    . Sulfa Antibiotics Other (See Comments)    Doesn't remember   . Sulfasalazine Other (See Comments)    Doesn't remember   . Other Rash  . Tetracyclines & Related Rash    Past Medical History, Surgical history, Social history, and Family History were reviewed and updated.  Review of Systems: Review of Systems  Constitutional: Positive for malaise/fatigue.  HENT: Positive for congestion.   Eyes: Negative.   Respiratory: Positive for shortness of breath and wheezing.    Cardiovascular: Negative.   Gastrointestinal: Negative.   Genitourinary: Negative.   Musculoskeletal: Negative.   Skin: Negative.   Neurological: Negative.   Endo/Heme/Allergies: Negative.   Psychiatric/Behavioral: Negative.     Physical Exam:  weight is 118 lb (53.5 kg). Her temporal temperature is 97.3 F (36.3 C) (abnormal). Her blood pressure is 143/82 (abnormal) and her pulse is 100. Her oxygen saturation is 99%.   Physical Exam Vitals signs reviewed.  HENT:     Head: Normocephalic and atraumatic.  Eyes:     Pupils: Pupils are equal, round, and reactive to light.  Neck:     Musculoskeletal: Normal range of motion.  Cardiovascular:     Rate and Rhythm: Normal rate and regular rhythm.     Heart sounds: Normal heart sounds.  Pulmonary:     Effort: Pulmonary effort is normal.     Breath sounds: Normal breath sounds.  Abdominal:     General: Bowel sounds are normal.     Palpations: Abdomen is soft.  Musculoskeletal: Normal range of motion.        General: No tenderness or deformity.  Lymphadenopathy:     Cervical: No cervical adenopathy.  Skin:    General: Skin is warm and dry.     Findings: No erythema or rash.  Neurological:     Mental Status: She is alert and oriented to person, place, and time.  Psychiatric:        Behavior: Behavior normal.        Thought Content: Thought content normal.        Judgment: Judgment normal.      Lab Results  Component Value Date   WBC 6.2 08/01/2019   HGB 13.2 08/01/2019   HCT 40.4 08/01/2019   MCV 89.8 08/01/2019   PLT 209 08/01/2019     Chemistry      Component Value Date/Time   NA 137 08/01/2019 1200   NA 141 07/13/2017 0743   NA 138 08/01/2015 1124   K 4.2 08/01/2019 1200   K 4.2 07/13/2017 0743   K 4.0 08/01/2015 1124   CL 102 08/01/2019 1200   CL 105 07/13/2017 0743   CO2 28 08/01/2019 1200   CO2 27 07/13/2017 0743   CO2 28 08/01/2015 1124   BUN 16 08/01/2019 1200   BUN 16 07/13/2017 0743   BUN 10.4  08/01/2015 1124   CREATININE 0.91 08/01/2019 1200   CREATININE 0.88 04/24/2019 1136   CREATININE 0.8 08/01/2015 1124      Component Value Date/Time   CALCIUM 9.3 08/01/2019 1200   CALCIUM 9.5 07/13/2017 0743   CALCIUM 9.6 08/01/2015 1124   ALKPHOS 57 08/01/2019 1200   ALKPHOS 68 07/13/2017 0743   ALKPHOS 88 08/01/2015 1124  AST 25 08/01/2019 1200   AST 23 08/01/2015 1124   ALT 13 08/01/2019 1200   ALT 19 07/13/2017 0743   ALT 12 08/01/2015 1124   BILITOT 0.4 08/01/2019 1200   BILITOT <0.30 08/01/2015 1124      Impression and Plan: Ms. Watchman is 78 year old white female. She has mantle cell lymphoma. Again, by the [facility] she is an intermediate risk.  She received 2 different courses of chemotherapy. She could not tolerate R-CHOP with Velcade substituting for vincristine. This however was incredibly helpful. We finished up with Rituxan/bendamustine. She completed this in August 2016.  We then started maintenance Rituxan. She started this in November 2016.  She completed this in November 2018.    For right now, we can just follow her along.  We do have to see her every couple months to flush her Port-A-Cath.  I think I can probably see her back in 6 months.  I do not see need for a PET scan at the present time.   Volanda Napoleon, MD 11/18/202012:34 PM

## 2019-08-01 NOTE — Patient Instructions (Signed)
Implanted Port Insertion, Care After This sheet gives you information about how to care for yourself after your procedure. Your health care provider may also give you more specific instructions. If you have problems or questions, contact your health care provider. What can I expect after the procedure? After the procedure, it is common to have:  Discomfort at the port insertion site.  Bruising on the skin over the port. This should improve over 3-4 days. Follow these instructions at home: Port care  After your port is placed, you will get a manufacturer's information card. The card has information about your port. Keep this card with you at all times.  Take care of the port as told by your health care provider. Ask your health care provider if you or a family member can get training for taking care of the port at home. A home health care nurse may also take care of the port.  Make sure to remember what type of port you have. Incision care      Follow instructions from your health care provider about how to take care of your port insertion site. Make sure you: ? Wash your hands with soap and water before and after you change your bandage (dressing). If soap and water are not available, use hand sanitizer. ? Change your dressing as told by your health care provider. ? Leave stitches (sutures), skin glue, or adhesive strips in place. These skin closures may need to stay in place for 2 weeks or longer. If adhesive strip edges start to loosen and curl up, you may trim the loose edges. Do not remove adhesive strips completely unless your health care provider tells you to do that.  Check your port insertion site every day for signs of infection. Check for: ? Redness, swelling, or pain. ? Fluid or blood. ? Warmth. ? Pus or a bad smell. Activity  Return to your normal activities as told by your health care provider. Ask your health care provider what activities are safe for you.  Do not  lift anything that is heavier than 10 lb (4.5 kg), or the limit that you are told, until your health care provider says that it is safe. General instructions  Take over-the-counter and prescription medicines only as told by your health care provider.  Do not take baths, swim, or use a hot tub until your health care provider approves. Ask your health care provider if you may take showers. You may only be allowed to take sponge baths.  Do not drive for 24 hours if you were given a sedative during your procedure.  Wear a medical alert bracelet in case of an emergency. This will tell any health care providers that you have a port.  Keep all follow-up visits as told by your health care provider. This is important. Contact a health care provider if:  You cannot flush your port with saline as directed, or you cannot draw blood from the port.  You have a fever or chills.  You have redness, swelling, or pain around your port insertion site.  You have fluid or blood coming from your port insertion site.  Your port insertion site feels warm to the touch.  You have pus or a bad smell coming from the port insertion site. Get help right away if:  You have chest pain or shortness of breath.  You have bleeding from your port that you cannot control. Summary  Take care of the port as told by your health   care provider. Keep the manufacturer's information card with you at all times.  Change your dressing as told by your health care provider.  Contact a health care provider if you have a fever or chills or if you have redness, swelling, or pain around your port insertion site.  Keep all follow-up visits as told by your health care provider. This information is not intended to replace advice given to you by your health care provider. Make sure you discuss any questions you have with your health care provider. Document Released: 06/20/2013 Document Revised: 03/28/2018 Document Reviewed: 03/28/2018  [employer]vier Patient Education  2020 [employer]  

## 2019-08-02 LAB — IGG, IGA, IGM
IgA: 21 mg/dL — ABNORMAL LOW (ref 64–422)
IgG (Immunoglobin G), Serum: 493 mg/dL — ABNORMAL LOW (ref 586–1602)
IgM (Immunoglobulin M), Srm: 30 mg/dL (ref 26–217)

## 2019-08-02 LAB — LACTATE DEHYDROGENASE: LDH: 176 U/L (ref 98–192)

## 2019-09-12 ENCOUNTER — Other Ambulatory Visit: Payer: Self-pay | Admitting: Internal Medicine

## 2019-09-28 ENCOUNTER — Telehealth: Payer: Self-pay | Admitting: Internal Medicine

## 2019-09-28 NOTE — Telephone Encounter (Signed)
patient requests DEXA order faxed to [facility]- per Dr Melford Aase ok- faxed. Patient will call to schedule appointment

## 2019-10-01 ENCOUNTER — Inpatient Hospital Stay: Payer: PPO | Attending: Hematology & Oncology

## 2019-10-01 ENCOUNTER — Other Ambulatory Visit: Payer: Self-pay

## 2019-10-01 DIAGNOSIS — Z9221 Personal history of antineoplastic chemotherapy: Secondary | ICD-10-CM | POA: Diagnosis not present

## 2019-10-01 DIAGNOSIS — C8312 Mantle cell lymphoma, intrathoracic lymph nodes: Secondary | ICD-10-CM | POA: Diagnosis not present

## 2019-10-01 DIAGNOSIS — Z452 Encounter for adjustment and management of vascular access device: Secondary | ICD-10-CM | POA: Insufficient documentation

## 2019-10-01 NOTE — Patient Instructions (Signed)

## 2019-10-28 ENCOUNTER — Encounter: Payer: Self-pay | Admitting: Internal Medicine

## 2019-10-28 NOTE — Patient Instructions (Addendum)
- Link to sign up at [place] Web site for the Covid[place] vaccine   [email or web address]   Or call [phone]  ++++++++++++++++++++++++++++++++  Vit D  & Vit C 1,000 mg   are recommended to help protect  against the Covid-19 and other Corona viruses.    Also it's recommended  to take  Zinc 50 mg  to help  protect against the Covid-19   and best place to get  is also on Amazon.com  and don't pay more than 6-8 cents /pill !  ================================ Coronavirus (COVID-19) Are you at risk?  Are you at risk for the Coronavirus (COVID-19)?  To be considered HIGH RISK for Coronavirus (COVID-19), you have to meet the following criteria:  . Traveled to [place], [place], [place], [place] or [place]; or in the [place] to [place], [place], [place]  . or [place]; and have fever, cough, and shortness of breath within the last 2 weeks of travel OR . Been in close contact with a person diagnosed with COVID-19 within the last 2 weeks and have  . fever, cough,and shortness of breath .  . IF YOU DO NOT MEET THESE CRITERIA, YOU ARE CONSIDERED LOW RISK FOR COVID-19.  What to do if you are HIGH RISK for COVID-19?  . If you are having a medical emergency, call 911. . Seek medical care right away. Before you go to a doctor's office, urgent care or emergency department, .  call ahead and tell them about your recent travel, contact with someone diagnosed with COVID-19  .  and your symptoms.  . You should receive instructions from your physician's office regarding next steps of care.  . When you arrive at healthcare provider, tell the healthcare staff immediately you have returned from  . visiting [place], [place], [place], [place] or [place]; or traveled in the [place] to [place], [place],  . [place] or [place] in the last two  weeks or you have been in close contact with a person diagnosed with  . COVID-19 in the last 2 weeks.   . Tell the health care staff about your symptoms: fever, cough and shortness of breath. . After you have been seen by a medical provider, you will be either: o Tested for (COVID-19) and discharged home on quarantine except to seek medical care if  o symptoms worsen, and asked to  - Stay home and avoid contact with others until you get your results (4-5 days)  - Avoid travel on public transportation if possible (such as bus, train, or airplane) or o Sent to the Emergency Department by EMS for evaluation, COVID-19 testing  and  o possible admission depending on your condition and test results.  What to do if you are LOW RISK for COVID-19?  Reduce your risk of any infection by using the same precautions used for avoiding the common cold or flu:  . Wash your hands often with soap and warm water for at least 20 seconds.  If soap and water are not readily available,  . use an alcohol-based hand sanitizer with at least 60% alcohol.  . If coughing or sneezing, cover your mouth and nose by coughing or sneezing into the elbow areas of your shirt or coat, .  into a tissue or into your sleeve (not your hands). . Avoid shaking hands with others and consider head nods or verbal greetings only. . Avoid touching your eyes, nose, or mouth with unwashed hands.  .   Avoid close contact with people who are sick. . Avoid places or events with large numbers of people in one location, like concerts or sporting events. . Carefully consider travel plans you have or are making. . If you are planning any travel outside or inside the [place], visit the CDC's Travelers' Health webpage for the latest health notices. . If you have some symptoms but not all symptoms, continue to monitor at home and seek medical attention  . if your symptoms worsen. . If you are having a medical emergency, call  911.   . >>>>>>>>>>>>>>>>>>>>>>>>>>>>>>>>> . We Do NOT Approve of  [facility], [employer]  Our Patients  To Do Home Visits & We Do NOT Approve of [employer] > > > > > > > > > > > > > > > > > > > > > > > > > > > > > > > > > > > > > > >  Preventive Care for Adults  A healthy lifestyle and preventive care can promote health and wellness. Preventive health guidelines for women include the following key practices.  A routine yearly physical is a good way to check with your health care provider about your health and preventive screening. It is a chance to share any concerns and updates on your health and to receive a thorough exam.  Visit your dentist for a routine exam and preventive care every 6 months. Brush your teeth twice a day and floss once a day. Good oral hygiene prevents tooth decay and gum disease.  The frequency of eye exams is based on your age, health, family medical history, use of contact lenses, and other factors. Follow your health care provider's recommendations for frequency of eye exams.  Eat a healthy diet. Foods like vegetables, fruits, whole grains, low-fat dairy products, and lean protein foods contain the nutrients you need without too many calories. Decrease your intake of foods high in solid fats, added sugars, and salt. Eat the right amount of calories for you. Get information about a proper diet from your health care provider, if necessary.  Regular physical exercise is one of the most important things you can do for your health. Most adults should get at least 150 minutes of moderate-intensity exercise (any activity that increases your heart rate and causes you to sweat) each week. In addition, most adults need muscle-strengthening exercises on 2 or more days a week.  Maintain a healthy weight. The body mass index (BMI) is a screening tool to identify possible weight problems. It provides an estimate of body fat based on height and  weight. Your health care provider can find your BMI and can help you achieve or maintain a healthy weight. For adults 20 years and older:  A BMI below 18.5 is considered underweight.  A BMI of 18.5 to 24.9 is normal.  A BMI of 25 to 29.9 is considered overweight.  A BMI of 30 and above is considered obese.  Maintain normal blood lipids and cholesterol levels by exercising and minimizing your intake of saturated fat. Eat a balanced diet with plenty of fruit and vegetables. If your lipid or cholesterol levels are high, you are over 50, or you are at high risk for heart disease, you may need your cholesterol levels checked more frequently. Ongoing high lipid and cholesterol levels should be treated with medicines if diet and exercise are not working.  If you smoke, find out from your health care provider how to quit. If you  do not use tobacco, do not start.  Lung cancer screening is recommended for adults aged 55-80 years who are at high risk for developing lung cancer because of a history of smoking. A yearly low-dose CT scan of the lungs is recommended for people who have at least a 30-pack-year history of smoking and are a current smoker or have quit within the past 15 years. A pack year of smoking is smoking an average of 1 pack of cigarettes a day for 1 year (for example: 1 pack a day for 30 years or 2 packs a day for 15 years). Yearly screening should continue until the smoker has stopped smoking for at least 15 years. Yearly screening should be stopped for people who develop a health problem that would prevent them from having lung cancer treatment.  Avoid use of street drugs. Do not share needles with anyone. Ask for help if you need support or instructions about stopping the use of drugs.  High blood pressure causes heart disease and increases the risk of stroke.  Ongoing high blood pressure should be treated with medicines if weight loss and exercise do not work.  If you are 55-79 years  old, ask your health care provider if you should take aspirin to prevent strokes.  Diabetes screening involves taking a blood sample to check your fasting blood sugar level. This should be done once every 3 years, after age 45, if you are within normal weight and without risk factors for diabetes. Testing should be considered at a younger age or be carried out more frequently if you are overweight and have at least 1 risk factor for diabetes.  Breast cancer screening is essential preventive care for women. You should practice "breast self-awareness." This means understanding the normal appearance and feel of your breasts and may include breast self-examination. Any changes detected, no matter how small, should be reported to a health care provider. Women in their 20s and 30s should have a clinical breast exam (CBE) by a health care provider as part of a regular health exam every 1 to 3 years. After age 40, women should have a CBE every year. Starting at age 40, women should consider having a mammogram (breast X-ray test) every year. Women who have a family history of breast cancer should talk to their health care provider about genetic screening. Women at a high risk of breast cancer should talk to their health care providers about having an MRI and a mammogram every year.  Breast cancer gene (BRCA)-related cancer risk assessment is recommended for women who have family members with BRCA-related cancers. BRCA-related cancers include breast, ovarian, tubal, and peritoneal cancers. Having family members with these cancers may be associated with an increased risk for harmful changes (mutations) in the breast cancer genes BRCA1 and BRCA2. Results of the assessment will determine the need for genetic counseling and BRCA1 and BRCA2 testing.  Routine pelvic exams to screen for cancer are no longer recommended for nonpregnant women who are considered low risk for cancer of the pelvic organs (ovaries, uterus, and  vagina) and who do not have symptoms. Ask your health care provider if a screening pelvic exam is right for you.  If you have had past treatment for cervical cancer or a condition that could lead to cancer, you need Pap tests and screening for cancer for at least 20 years after your treatment. If Pap tests have been discontinued, your risk factors (such as having a new sexual partner) need to be   reassessed to determine if screening should be resumed. Some women have medical problems that increase the chance of getting cervical cancer. In these cases, your health care provider may recommend more frequent screening and Pap tests.    Colorectal cancer can be detected and often prevented. Most routine colorectal cancer screening begins at the age of 50 years and continues through age 75 years. However, your health care provider may recommend screening at an earlier age if you have risk factors for colon cancer. On a yearly basis, your health care provider may provide home test kits to check for hidden blood in the stool. Use of a small camera at the end of a tube, to directly examine the colon (sigmoidoscopy or colonoscopy), can detect the earliest forms of colorectal cancer. Talk to your health care provider about this at age 50, when routine screening begins.  Direct exam of the colon should be repeated every 5-10 years through age 75 years, unless early forms of pre-cancerous polyps or small growths are found.  Osteoporosis is a disease in which the bones lose minerals and strength with aging. This can result in serious bone fractures or breaks. The risk of osteoporosis can be identified using a bone density scan. Women ages 65 years and over and women at risk for fractures or osteoporosis should discuss screening with their health care providers. Ask your health care provider whether you should take a calcium supplement or vitamin D to reduce the rate of osteoporosis.  Menopause can be associated with  physical symptoms and risks. Hormone replacement therapy is available to decrease symptoms and risks. You should talk to your health care provider about whether hormone replacement therapy is right for you.  Use sunscreen. Apply sunscreen liberally and repeatedly throughout the day. You should seek shade when your shadow is shorter than you. Protect yourself by wearing long sleeves, pants, a wide-brimmed hat, and sunglasses year round, whenever you are outdoors.  Once a month, do a whole body skin exam, using a mirror to look at the skin on your back. Tell your health care provider of new moles, moles that have irregular borders, moles that are larger than a pencil eraser, or moles that have changed in shape or color.  Stay current with required vaccines (immunizations).  Influenza vaccine. All adults should be immunized every year.  Tetanus, diphtheria, and acellular pertussis (Td, Tdap) vaccine. Pregnant women should receive 1 dose of Tdap vaccine during each pregnancy. The dose should be obtained regardless of the length of time since the last dose. Immunization is preferred during the 27th-36th week of gestation. An adult who has not previously received Tdap or who does not know her vaccine status should receive 1 dose of Tdap. This initial dose should be followed by tetanus and diphtheria toxoids (Td) booster doses every 10 years. Adults with an unknown or incomplete history of completing a 3-dose immunization series with Td-containing vaccines should begin or complete a primary immunization series including a Tdap dose. Adults should receive a Td booster every 10 years.    Zoster vaccine. One dose is recommended for adults aged 60 years or older unless certain conditions are present.    Pneumococcal 13-valent conjugate (PCV13) vaccine. When indicated, a person who is uncertain of her immunization history and has no record of immunization should receive the PCV13 vaccine. An adult aged 19  years or older who has certain medical conditions and has not been previously immunized should receive 1 dose of PCV13 vaccine. This PCV13   should be followed with a dose of pneumococcal polysaccharide (PPSV23) vaccine. The PPSV23 vaccine dose should be obtained at least 1 or more year(s) after the dose of PCV13 vaccine. An adult aged 19 years or older who has certain medical conditions and previously received 1 or more doses of PPSV23 vaccine should receive 1 dose of PCV13. The PCV13 vaccine dose should be obtained 1 or more years after the last PPSV23 vaccine dose.    Pneumococcal polysaccharide (PPSV23) vaccine. When PCV13 is also indicated, PCV13 should be obtained first. All adults aged 65 years and older should be immunized. An adult younger than age 65 years who has certain medical conditions should be immunized. Any person who resides in a nursing home or long-term care facility should be immunized. An adult smoker should be immunized. People with an immunocompromised condition and certain other conditions should receive both PCV13 and PPSV23 vaccines. People with human immunodeficiency virus (HIV) infection should be immunized as soon as possible after diagnosis. Immunization during chemotherapy or radiation therapy should be avoided. Routine use of PPSV23 vaccine is not recommended for American Indians, [place] Natives, or people younger than 65 years unless there are medical conditions that require PPSV23 vaccine. When indicated, people who have unknown immunization and have no record of immunization should receive PPSV23 vaccine. One-time revaccination 5 years after the first dose of PPSV23 is recommended for people aged 19-64 years who have chronic kidney failure, nephrotic syndrome, asplenia, or immunocompromised conditions. People who received 1-2 doses of PPSV23 before age 65 years should receive another dose of PPSV23 vaccine at age 65 years or later if at least 5 years have passed since the  previous dose. Doses of PPSV23 are not needed for people immunized with PPSV23 at or after age 65 years.   Preventive Services / Frequency  Ages 65 years and over  Blood pressure check.  Lipid and cholesterol check.  Lung cancer screening. / Every year if you are aged 55-80 years and have a 30-pack-year history of smoking and currently smoke or have quit within the past 15 years. Yearly screening is stopped once you have quit smoking for at least 15 years or develop a health problem that would prevent you from having lung cancer treatment.  Clinical breast exam.** / Every year after age 40 years.   BRCA-related cancer risk assessment.** / For women who have family members with a BRCA-related cancer (breast, ovarian, tubal, or peritoneal cancers).  Mammogram.** / Every year beginning at age 40 years and continuing for as long as you are in good health. Consult with your health care provider.  Pap test.** / Every 3 years starting at age 30 years through age 65 or 70 years with 3 consecutive normal Pap tests. Testing can be stopped between 65 and 70 years with 3 consecutive normal Pap tests and no abnormal Pap or HPV tests in the past 10 years.  Fecal occult blood test (FOBT) of stool. / Every year beginning at age 50 years and continuing until age 75 years. You may not need to do this test if you get a colonoscopy every 10 years.  Flexible sigmoidoscopy or colonoscopy.** / Every 5 years for a flexible sigmoidoscopy or every 10 years for a colonoscopy beginning at age 50 years and continuing until age 75 years.  Hepatitis C blood test.** / For all people born from 1945 through 1965 and any individual with known risks for hepatitis C.  Osteoporosis screening.** / A one-time screening for women ages 65   years and over and women at risk for fractures or osteoporosis.  Skin self-exam. / Monthly.  Influenza vaccine. / Every year.  Tetanus, diphtheria, and acellular pertussis (Tdap/Td)  vaccine.** / 1 dose of Td every 10 years.  Zoster vaccine.** / 1 dose for adults aged 60 years or older.  Pneumococcal 13-valent conjugate (PCV13) vaccine.** / Consult your health care provider.  Pneumococcal polysaccharide (PPSV23) vaccine.** / 1 dose for all adults aged 65 years and older. Screening for abdominal aortic aneurysm (AAA)  by ultrasound is recommended for people who have history of high blood pressure or who are current or former smokers. ++++++++++++++++++++ Recommend Adult Low Dose Aspirin or  coated  Aspirin 81 mg daily  To reduce risk of Colon Cancer 40 %,  Skin Cancer 26 % ,  Melanoma 46%  and  Pancreatic cancer 60% ++++++++++++++++++++ Vitamin D goal  is between 70-100.  Please make sure that you are taking your Vitamin D as directed.  It is very important as a natural anti-inflammatory  helping hair, skin, and nails, as well as reducing stroke and heart attack risk.  It helps your bones and helps with mood. It also decreases numerous cancer risks so please take it as directed.  Low Vit D is associated with a 200-300% higher risk for CANCER  and 200-300% higher risk for HEART   ATTACK  &  STROKE.   ...................................... It is also associated with higher death rate at younger ages,  autoimmune diseases like Rheumatoid arthritis, Lupus, Multiple Sclerosis.    Also many other serious conditions, like depression, Alzheimer's Dementia, infertility, muscle aches, fatigue, fibromyalgia - just to name a few. ++++++++++++++++++ Recommend the book "The END of DIETING" by Dr Joel Fuhrman  & the book "The END of DIABETES " by Dr Joel Fuhrman At [employer].com - get book & Audio CD's    Being diabetic has a  300% increased risk for heart attack, stroke, cancer, and alzheimer- type vascular dementia. It is very important that you work harder with diet by avoiding all foods that are white. Avoid white rice (brown & wild rice is OK), white potatoes (sweetpotatoes  in moderation is OK), White bread or wheat bread or anything made out of white flour like bagels, donuts, rolls, buns, biscuits, cakes, pastries, cookies, pizza crust, and pasta (made from white flour & egg whites) - vegetarian pasta or spinach or wheat pasta is OK. Multigrain breads like Arnold's or Pepperidge [employer], or multigrain sandwich thins or flatbreads.  Diet, exercise and weight loss can reverse and cure diabetes in the early stages.  Diet, exercise and weight loss is very important in the control and prevention of complications of diabetes which affects every system in your body, ie. Brain - dementia/stroke, eyes - glaucoma/blindness, heart - heart attack/heart failure, kidneys - dialysis, stomach - gastric paralysis, intestines - malabsorption, nerves - severe painful neuritis, circulation - gangrene & loss of a leg(s), and finally cancer and Alzheimers.    I recommend avoid fried & greasy foods,  sweets/candy, white rice (brown or wild rice or Quinoa is OK), white potatoes (sweet potatoes are OK) - anything made from white flour - bagels, doughnuts, rolls, buns, biscuits,white and wheat breads, pizza crust and traditional pasta made of white flour & egg white(vegetarian pasta or spinach or wheat pasta is OK).  Multi-grain bread is OK - like multi-grain flat bread or sandwich thins. Avoid alcohol in excess. Exercise is also important.    Eat all the vegetables you want -   avoid meat, especially red meat and dairy - especially cheese.  Cheese is the most concentrated form of trans-fats which is the worst thing to clog up our arteries. Veggie cheese is OK which can be found in the fresh produce section at [employer] or [employer] or [employer]  +++++++++++++++++++ DASH Eating Plan  DASH stands for "Dietary Approaches to Stop Hypertension."   The DASH eating plan is a healthy eating plan that has been shown to reduce high blood pressure (hypertension). Additional health benefits may include  reducing the risk of type 2 diabetes mellitus, heart disease, and stroke. The DASH eating plan may also help with weight loss. WHAT DO I NEED TO KNOW ABOUT THE DASH EATING PLAN? For the DASH eating plan, you will follow these general guidelines:  Choose foods with a percent daily value for sodium of less than 5% (as listed on the food label).  Use salt-free seasonings or herbs instead of table salt or sea salt.  Check with your health care provider or pharmacist before using salt substitutes.  Eat lower-sodium products, often labeled as "lower sodium" or "no salt added."  Eat fresh foods.  Eat more vegetables, fruits, and low-fat dairy products.  Choose whole grains. Look for the word "whole" as the first word in the ingredient list.  Choose fish   Limit sweets, desserts, sugars, and sugary drinks.  Choose heart-healthy fats.  Eat veggie cheese   Eat more home-cooked food and less restaurant, buffet, and fast food.  Limit fried foods.  Cook foods using methods other than frying.  Limit canned vegetables. If you do use them, rinse them well to decrease the sodium.  When eating at a restaurant, ask that your food be prepared with less salt, or no salt if possible.                      WHAT FOODS CAN I EAT? Read Dr Fara Olden Fuhrman's books on The End of Dieting & The End of Diabetes  Grains Whole grain or whole wheat bread. Brown rice. Whole grain or whole wheat pasta. Quinoa, bulgur, and whole grain cereals. Low-sodium cereals. Corn or whole wheat flour tortillas. Whole grain cornbread. Whole grain crackers. Low-sodium crackers.  Vegetables Fresh or frozen vegetables (raw, steamed, roasted, or grilled). Low-sodium or reduced-sodium tomato and vegetable juices. Low-sodium or reduced-sodium tomato sauce and paste. Low-sodium or reduced-sodium canned vegetables.   Fruits All fresh, canned (in natural juice), or frozen fruits.  Protein Products  All fish and seafood.  Dried  beans, peas, or lentils. Unsalted nuts and seeds. Unsalted canned beans.  Dairy Low-fat dairy products, such as skim or 1% milk, 2% or reduced-fat cheeses, low-fat ricotta or cottage cheese, or plain low-fat yogurt. Low-sodium or reduced-sodium cheeses.  Fats and Oils Tub margarines without trans fats. Light or reduced-fat mayonnaise and salad dressings (reduced sodium). Avocado. Safflower, olive, or canola oils. Natural peanut or almond butter.  Other Unsalted popcorn and pretzels. The items listed above may not be a complete list of recommended foods or beverages. Contact your dietitian for more options.  +++++++++++++++  WHAT FOODS ARE NOT RECOMMENDED? Grains/ White flour or wheat flour White bread. White pasta. White rice. Refined cornbread. Bagels and croissants. Crackers that contain trans fat.  Vegetables  Creamed or fried vegetables. Vegetables in a . Regular canned vegetables. Regular canned tomato sauce and paste. Regular tomato and vegetable juices.  Fruits Dried fruits. Canned fruit in light or heavy syrup. Fruit juice.  Meat and Other Protein Products Meat in general - RED meat & White meat.  Fatty cuts of meat. Ribs, chicken wings, all processed meats as bacon, sausage, bologna, salami, fatback, hot dogs, bratwurst and packaged luncheon meats.  Dairy Whole or 2% milk, cream, half-and-half, and cream cheese. Whole-fat or sweetened yogurt. Full-fat cheeses or blue cheese. Non-dairy creamers and whipped toppings. Processed cheese, cheese spreads, or cheese curds.  Condiments Onion and garlic salt, seasoned salt, table salt, and sea salt. Canned and packaged gravies. Worcestershire sauce. Tartar sauce. Barbecue sauce. Teriyaki sauce. Soy sauce, including reduced sodium. Steak sauce. Fish sauce. Oyster sauce. Cocktail sauce. Horseradish. Ketchup and mustard. Meat flavorings and tenderizers. Bouillon cubes. Hot sauce. Tabasco sauce. Marinades. Taco seasonings.  Relishes.  Fats and Oils Butter, stick margarine, lard, shortening and bacon fat. Coconut, palm kernel, or palm oils. Regular salad dressings.  Pickles and olives. Salted popcorn and pretzels.  The items listed above may not be a complete list of foods and beverages to avoid.    

## 2019-10-28 NOTE — Progress Notes (Signed)
Annual Screening/Preventative Visit & Comprehensive Evaluation &  Examination     This very nice 79 y.o. MWF presents for a Screening /Preventative Visit & comprehensive evaluation and management of multiple medical co-morbidities.  Patient has been followed for HTN, HLD, Essential Tremor, Prediabetes, Hypothyroidism  and Vitamin D Deficiency. Patient is also followed / treated  by Dr Marin Olp since Jan 2016  for a Mantle Cell Lymphoma. She was  treated in Jan 2016 for a RLE DVT w/Xarelto x 6 months. She follows with Dr Melvyn Novas for her COPD.       Patient is followed expectantly for labile HTN.  Patient's BP has been controlled at home and patient denies any cardiac symptoms as chest pain, palpitations, shortness of breath, dizziness or ankle swelling. Today's BP is borderline elevated diastolic - 0000000.     Patient's hyperlipidemia is controlled with diet and Rosuvastatin. Patient denies myalgias or other medication SE's. Last lipids were at goal:  Lab Results  Component Value Date   CHOL 174 07/19/2019   HDL 60 07/19/2019   LDLCALC 91 07/19/2019   TRIG 125 07/19/2019   CHOLHDL 2.9 07/19/2019       Patient has hx/o prediabetes (A1c 5.9% / 2012) and patient denies reactive hypoglycemic symptoms, visual blurring, diabetic polys or paresthesias. Last A1c was Normal & at goal:  Lab Results  Component Value Date   HGBA1C 5.6 04/17/2019      She was diagnosed Hypothyroid in 2013 and initiated on Thyroid Replacement.     Finally, patient has history of Vitamin D Deficiency and last Vitamin D was at goal:  Lab Results  Component Value Date   VD25OH 69 04/17/2019    Current Outpatient Medications on File Prior to Visit  Medication Sig  . acetaminophen (TYLENOL) 325 MG tablet Take 325 mg by mouth every 6 (six) hours as needed for moderate pain (For pain.).   [name] ALPRAZolam (XANAX) 0.5 MG tablet Take 0.25 mg by mouth at bedtime as needed for anxiety.  [name] amoxicillin (AMOXIL) 500 MG capsule  TAKE 4 CAPSULES BY MOUTH ONE HOUR BEFORE DENTAL PROCEDURE.  [name] Cholecalciferol (VITAMIN D3) 50 MCG (2000 UT) capsule Take 4,000 Units by mouth.   . diazepam (VALIUM) 5 MG tablet Take 5 mg by mouth every 6 (six) hours as needed for anxiety. Pt takes 2.5 mg as needed for sleep  . hyoscyamine (LEVSIN SL) 0.125 MG SL tablet TAKE 1 TO 2 TABLETS BY MOUTH EVERY 4 HOURS AS NEEDED FOR NAUSEA, CRAMPING, BLOATING OR DIARRHEA  . levothyroxine (SYNTHROID) 50 MCG tablet Take 1 tablet daily on an empty stomach with only water for 30 minutes & no Antacid meds, Calcium or Magnesium for 4 hours & avoid Biotin  . lidocaine-prilocaine (EMLA) cream APPLY TO PAC 1 HOUR BEFORE ACCESS.  [name] LORazepam (ATIVAN) 0.5 MG tablet Take 1 tablet (0.5 mg total) by mouth every 6 (six) hours as needed (Nausea or vomiting).  . polyethylene glycol (MIRALAX / GLYCOLAX) 17 g packet Take 17 g by mouth daily.  [name] PROAIR HFA 108 (90 Base) MCG/ACT inhaler INHALE 2 PUFFS EVERY 4 HOURS AS NEEDED INTO THE LUNGS FOR WHEEZING OR SHORTNESS OF BREATH.  [name] Respiratory Therapy Supplies (FLUTTER) DEVI Take as directed  . rosuvastatin (CRESTOR) 10 MG tablet 1/4 tablet every day  . Zinc 50 MG CAPS Take by mouth daily.   Current Facility-Administered Medications on File Prior to Visit  Medication  . ipratropium-albuterol (DUONEB) 0.5-2.5 (3) MG/3ML nebulizer solution 3 mL  Allergies  Allergen Reactions  . Alendronate Other (See Comments)    "made me choke"    . Risedronate Sodium Other (See Comments)    "made me choke"    . Sulfa Antibiotics Other (See Comments)    Doesn't remember   . Sulfasalazine Other (See Comments)    Doesn't remember   . Other Rash  . Tetracyclines & Related Rash   Past Medical History:  Diagnosis Date  . Adenomatous colon polyp 1994  . Asthma   . Cataract 2013   bilateral eyes  . Diverticulosis   . Family history of ischemic heart disease   . Fibrocystic breast disease   . GERD (gastroesophageal reflux disease)    . Hiatal hernia   . Hyperlipidemia   . Hypertension    pt denies - 01/16/18  . IBS (irritable bowel syndrome)   . Internal hemorrhoids   . Mantle cell lymphoma ([place]) 10/18/2014  . Melanoma ([place])    Facial, pt denies on 01/16/18  . OSA (obstructive sleep apnea)   . Osteoarthritis   . Osteoporosis   . Prediabetes   . Unspecified hypothyroidism   . Vitamin D deficiency    Health Maintenance  Topic Date Due  . TETANUS/TDAP  08/17/1962  . COLONOSCOPY  02/02/2022  . DEXA SCAN  Completed  . PNA vac Low Risk Adult  Completed  . INFLUENZA VACCINE  Discontinued   Immunization History  Administered Date(s) Administered  . Pneumococcal Conjugate-13 03/18/2016  . Pneumococcal Polysaccharide-23 06/18/2013  . Zoster 04/17/2013    Last Colon - 02/02/2017 - Dr Fuller Plan - recc 5 yr f/u due May 2023  Last MGM - 04/02/2019  Past Surgical History:  Procedure Laterality Date  . APPENDECTOMY    . BUNIONECTOMY    . CATARACT EXTRACTION, BILATERAL    . KNEE ARTHROSCOPY    . TONSILLECTOMY    . UMBILICAL HERNIA REPAIR    . VIDEO BRONCHOSCOPY Bilateral 10/12/2017   Procedure: VIDEO BRONCHOSCOPY WITHOUT FLUORO;  Surgeon: Tanda Rockers, MD;  Location: [facility];  Service: Endoscopy;  Laterality: Bilateral;   Family History  Problem Relation Age of Onset  . Emphysema Father   . Heart disease Father   . Heart attack Father 7       Died  . Heart disease Brother 35  . Rheum arthritis Sister   . Hypertension Mother   . Thyroid disease Mother   . Colon cancer Neg Hx   . Stomach cancer Neg Hx    Social History   Tobacco Use  . Smoking status: Never Smoker  . Smokeless tobacco: Never Used  . Tobacco comment: Never Used Tobacco  Substance Use Topics  . Alcohol use: No    Alcohol/week: 0.0 standard drinks  . Drug use: No    ROS Constitutional: Denies fever, chills, weight loss/gain, headaches, insomnia,  night sweats, and change in appetite. Does c/o fatigue. Eyes: Denies redness,  blurred vision, diplopia, discharge, itchy, watery eyes.  ENT: Denies discharge, congestion, post nasal drip, epistaxis, sore throat, earache, hearing loss, dental pain, Tinnitus, Vertigo, Sinus pain, snoring.  Cardio: Denies chest pain, palpitations, irregular heartbeat, syncope, dyspnea, diaphoresis, orthopnea, PND, claudication, edema Respiratory: denies cough, dyspnea, DOE, pleurisy, hoarseness, laryngitis, wheezing.  Gastrointestinal: Denies dysphagia, heartburn, reflux, water brash, pain, cramps, nausea, vomiting, bloating, diarrhea, constipation, hematemesis, melena, hematochezia, jaundice, hemorrhoids Genitourinary: Denies dysuria, frequency, urgency, nocturia, hesitancy, discharge, hematuria, flank pain Breast: Breast lumps, nipple discharge, bleeding.  Musculoskeletal: Denies arthralgia, myalgia, stiffness, Jt. Swelling, pain,  limp, and strain/sprain. Denies falls. Skin: Denies puritis, rash, hives, warts, acne, eczema, changing in skin lesion Neuro: No weakness, tremor, incoordination, spasms, paresthesia, pain Psychiatric: Denies confusion, memory loss, sensory loss. Denies Depression. Endocrine: Denies change in weight, skin, hair change, nocturia, and paresthesia, diabetic polys, visual blurring, hyper / hypo glycemic episodes.  Heme/Lymph: No excessive bleeding, bruising, enlarged lymph nodes.  Physical Exam  BP 124/90   Pulse 72   Temp (!) 97 F (36.1 C)   Resp 16   Ht 5\' 4"  (1.626 m)   Wt 115 lb 12.8 oz (52.5 kg)   LMP 09/14/2014   BMI 19.88 kg/m   General Appearance: Well nourished, well groomed and in no apparent distress.  Eyes: PERRLA, EOMs, conjunctiva no swelling or erythema, normal fundi and vessels. Sinuses: No frontal/maxillary tenderness ENT/Mouth: EACs patent / TMs  nl. Nares clear without erythema, swelling, mucoid exudates. Oral hygiene is good. No erythema, swelling, or exudate. Tongue normal, non-obstructing. Tonsils not swollen or erythematous. Hearing  normal.  Neck: Supple, thyroid not palpable. No bruits, nodes or JVD. Respiratory: Respiratory effort normal.  BS equal and clear bilateral without rales, rhonci, wheezing or stridor. Cardio: Heart sounds are normal with regular rate and rhythm and no murmurs, rubs or gallops. Peripheral pulses are normal and equal bilaterally without edema. No aortic or femoral bruits. Chest: symmetric with normal excursions and percussion. Breasts: Symmetric, without lumps, nipple discharge, retractions, or fibrocystic changes.  Abdomen: Flat, soft with bowel sounds active. Nontender, no guarding, rebound, hernias, masses, or organomegaly.  Lymphatics: Non tender without lymphadenopathy.  Genitourinary:  Musculoskeletal: Full ROM all peripheral extremities, joint stability, 5/5 strength, and normal gait. Skin: Warm and dry without rashes, lesions, cyanosis, clubbing or  ecchymosis.  Neuro: Cranial nerves intact, reflexes equal bilaterally. Normal muscle tone, no cerebellar symptoms. Sensation intact.  Pysch: Alert and oriented X 3, normal affect, Insight and Judgment appropriate.   Assessment and Plan  1. Annual Preventative Screening Examination  2. Labile hypertension  - EKG 12-Lead - Urinalysis, Routine w reflex microscopic - Microalbumin / creatinine urine ratio - CBC with Differential/Platelet - COMPLETE METABOLIC PANEL WITH GFR - Magnesium - TSH  3. Hyperlipidemia, mixed  - EKG 12-Lead - Lipid panel - TSH  4. Abnormal glucose  - EKG 12-Lead - Hemoglobin A1c - Insulin, random  5. Vitamin D deficiency  - VITAMIN D 25 Hydroxy   6. Prediabetes  - EKG 12-Lead - Hemoglobin A1c - Insulin, random  7. Hypothyroidism   - TSH  8. Mantle cell lymphoma ([place])   9. OSA and COPD overlap syndrome ([place])   10. Screening for colorectal cancer  - POC Hemoccult Bld/Stl  11. Screening for ischemic heart disease  - EKG 12-Lead  12. FHx: heart disease  - EKG 12-Lead  13.  Medication management  - Urinalysis, Routine w reflex microscopic - Microalbumin / creatinine urine ratio - CBC with Differential/Platelet - COMPLETE METABOLIC PANEL WITH GFR - Magnesium - Lipid panel - TSH - Hemoglobin A1c - Insulin, random - VITAMIN D 25 Hydroxy        Patient was counseled in prudent diet to achieve/maintain BMI less than 25 for weight control, BP monitoring, regular exercise and medications. Discussed med's effects and SE's. Screening labs and tests as requested with regular follow-up as recommended. Over 40 minutes of exam, counseling, chart review and high complex critical decision making was performed.   Kirtland Bouchard,  MD  To prevent harm (release of this note would result in harm to the life or physical safety of the patient or another).

## 2019-10-29 ENCOUNTER — Ambulatory Visit (INDEPENDENT_AMBULATORY_CARE_PROVIDER_SITE_OTHER): Payer: PPO | Admitting: Internal Medicine

## 2019-10-29 ENCOUNTER — Other Ambulatory Visit: Payer: Self-pay

## 2019-10-29 VITALS — BP 124/90 | HR 72 | Temp 97.0°F | Resp 16 | Ht 64.0 in | Wt 115.8 lb

## 2019-10-29 DIAGNOSIS — R0989 Other specified symptoms and signs involving the circulatory and respiratory systems: Secondary | ICD-10-CM | POA: Diagnosis not present

## 2019-10-29 DIAGNOSIS — R7303 Prediabetes: Secondary | ICD-10-CM

## 2019-10-29 DIAGNOSIS — E039 Hypothyroidism, unspecified: Secondary | ICD-10-CM | POA: Diagnosis not present

## 2019-10-29 DIAGNOSIS — R7309 Other abnormal glucose: Secondary | ICD-10-CM

## 2019-10-29 DIAGNOSIS — E559 Vitamin D deficiency, unspecified: Secondary | ICD-10-CM

## 2019-10-29 DIAGNOSIS — Z79899 Other long term (current) drug therapy: Secondary | ICD-10-CM

## 2019-10-29 DIAGNOSIS — E782 Mixed hyperlipidemia: Secondary | ICD-10-CM

## 2019-10-29 DIAGNOSIS — Z8249 Family history of ischemic heart disease and other diseases of the circulatory system: Secondary | ICD-10-CM

## 2019-10-29 DIAGNOSIS — Z1211 Encounter for screening for malignant neoplasm of colon: Secondary | ICD-10-CM

## 2019-10-29 DIAGNOSIS — Z136 Encounter for screening for cardiovascular disorders: Secondary | ICD-10-CM

## 2019-10-29 DIAGNOSIS — Z0001 Encounter for general adult medical examination with abnormal findings: Secondary | ICD-10-CM

## 2019-10-29 DIAGNOSIS — G4733 Obstructive sleep apnea (adult) (pediatric): Secondary | ICD-10-CM

## 2019-10-29 DIAGNOSIS — C8310 Mantle cell lymphoma, unspecified site: Secondary | ICD-10-CM

## 2019-10-29 DIAGNOSIS — I700 Atherosclerosis of aorta: Secondary | ICD-10-CM

## 2019-10-29 DIAGNOSIS — Z0000 Encounter for general adult medical examination without abnormal findings: Secondary | ICD-10-CM | POA: Diagnosis not present

## 2019-10-29 DIAGNOSIS — J449 Chronic obstructive pulmonary disease, unspecified: Secondary | ICD-10-CM

## 2019-10-29 DIAGNOSIS — Z1212 Encounter for screening for malignant neoplasm of rectum: Secondary | ICD-10-CM

## 2019-10-30 ENCOUNTER — Encounter: Payer: Self-pay | Admitting: *Deleted

## 2019-10-30 LAB — HEMOGLOBIN A1C
Hgb A1c MFr Bld: 5.7 % of total Hgb — ABNORMAL HIGH (ref <5.7)
Mean Plasma Glucose: 117 (calc)
eAG (mmol/L): 6.5 (calc)

## 2019-10-30 LAB — COMPLETE METABOLIC PANEL WITH GFR
AG Ratio: 1.9 (calc) (ref 1.0–2.5)
ALT: 13 U/L (ref 6–29)
AST: 26 U/L (ref 10–35)
Albumin: 4.4 g/dL (ref 3.6–5.1)
Alkaline phosphatase (APISO): 56 U/L (ref 37–153)
BUN/Creatinine Ratio: 15 (calc) (ref 6–22)
BUN: 14 mg/dL (ref 7–25)
CO2: 28 mmol/L (ref 20–32)
Calcium: 10.1 mg/dL (ref 8.6–10.4)
Chloride: 103 mmol/L (ref 98–110)
Creat: 0.96 mg/dL — ABNORMAL HIGH (ref 0.60–0.93)
GFR, Est African American: 67 mL/min/{1.73_m2} (ref >=60)
GFR, Est Non African American: 57 mL/min/{1.73_m2} — ABNORMAL LOW (ref >=60)
Globulin: 2.3 g/dL (calc) (ref 1.9–3.7)
Glucose, Bld: 93 mg/dL (ref 65–99)
Potassium: 4.2 mmol/L (ref 3.5–5.3)
Sodium: 140 mmol/L (ref 135–146)
Total Bilirubin: 0.6 mg/dL (ref 0.2–1.2)
Total Protein: 6.7 g/dL (ref 6.1–8.1)

## 2019-10-30 LAB — CBC WITH DIFFERENTIAL/PLATELET
Absolute Monocytes: 787 cells/uL (ref 200–950)
Basophils Absolute: 68 cells/uL (ref 0–200)
Basophils Relative: 1.1 %
Eosinophils Absolute: 192 cells/uL (ref 15–500)
Eosinophils Relative: 3.1 %
HCT: 44.5 % (ref 35.0–45.0)
Hemoglobin: 14.5 g/dL (ref 11.7–15.5)
Lymphs Abs: 1197 cells/uL (ref 850–3900)
MCH: 29 pg (ref 27.0–33.0)
MCHC: 32.6 g/dL (ref 32.0–36.0)
MCV: 89 fL (ref 80.0–100.0)
MPV: 10.3 fL (ref 7.5–12.5)
Monocytes Relative: 12.7 %
Neutro Abs: 3956 cells/uL (ref 1500–7800)
Neutrophils Relative %: 63.8 %
Platelets: 238 10*3/uL (ref 140–400)
RBC: 5 10*6/uL (ref 3.80–5.10)
RDW: 12.2 % (ref 11.0–15.0)
Total Lymphocyte: 19.3 %
WBC: 6.2 10*3/uL (ref 3.8–10.8)

## 2019-10-30 LAB — LIPID PANEL
Cholesterol: 183 mg/dL (ref <200)
HDL: 68 mg/dL (ref >=50)
LDL Cholesterol (Calc): 96 mg/dL (calc)
Non-HDL Cholesterol (Calc): 115 mg/dL (calc) (ref <130)
Total CHOL/HDL Ratio: 2.7 (calc) (ref <5.0)
Triglycerides: 98 mg/dL (ref <150)

## 2019-10-30 LAB — INSULIN, RANDOM: Insulin: 3.8 u[IU]/mL

## 2019-10-30 LAB — MAGNESIUM: Magnesium: 2 mg/dL (ref 1.5–2.5)

## 2019-10-30 LAB — VITAMIN D 25 HYDROXY (VIT D DEFICIENCY, FRACTURES): Vit D, 25-Hydroxy: 80 ng/mL (ref 30–100)

## 2019-10-30 LAB — TSH: TSH: 2.34 mIU/L (ref 0.40–4.50)

## 2019-11-08 ENCOUNTER — Other Ambulatory Visit: Payer: Self-pay | Admitting: Internal Medicine

## 2019-11-08 DIAGNOSIS — M818 Other osteoporosis without current pathological fracture: Secondary | ICD-10-CM

## 2019-11-08 DIAGNOSIS — M810 Age-related osteoporosis without current pathological fracture: Secondary | ICD-10-CM

## 2019-11-19 ENCOUNTER — Other Ambulatory Visit: Payer: Self-pay

## 2019-11-19 DIAGNOSIS — Z1211 Encounter for screening for malignant neoplasm of colon: Secondary | ICD-10-CM

## 2019-11-19 DIAGNOSIS — Z1212 Encounter for screening for malignant neoplasm of rectum: Secondary | ICD-10-CM

## 2019-11-19 LAB — POC HEMOCCULT BLD/STL (HOME/3-CARD/SCREEN)
Card #2 Fecal Occult Blod, POC: NEGATIVE
Card #3 Fecal Occult Blood, POC: NEGATIVE
Fecal Occult Blood, POC: NEGATIVE

## 2019-12-04 NOTE — Progress Notes (Signed)
Subjective:    Patient ID: Shannon Parsons, female    DOB: [birth year], 79 y.o.   MRN: [id number]  HPI 79 y.o. WF with history of HTN, GERD, preDM, diverticulosis, anxiety, chol and vitamin D being treated for Mantle cell lymphoma at cancer center with [facility], complicated by DVT, no longer on treatment. She also is followed by Dr Melvyn Novas for recurrent pulmonary infections, with diagnosis of obstructive bronchiectasis and asthma.    COVID vaccine on 02/15, states symptoms were before that.  At least for 3-6 months she has not been feeling well. Has had trouble sleeping.   She states she is not sleeping well, she can take a xanax but will wake up in the middle of the night and only get 3-4 hours, very tired in the morning.   She has been having sporadic nausea/illness, this past saturday she had nausea, vomiting at midnight, has had nausea all weekend, then Monday she had vomiting again, has been drinking ginger ale and took lorazepam that helped her sleep and nausea. She states will happen about 4 hours after foods, not associated with any foods. No trouble swallowing, no dysphagia, pills/food getting caught.   She has added zinc in last 6 months.   She has a prescription for xanax that she take occ, diazepam which she states does not work and the lorazepam that she only for nausea/vomiting from Dr. Marin Olp. She states the lorazepam helps her with sleep more than the xanax. She has history of sleep apnea, she is NOT on a CPAP.   She admits that she is very nervous with her husband having dementia.   BMI is Body mass index is 19.33 kg/m. she is having weight loss.  Wt Readings from Last 3 Encounters:  12/05/19 112 lb 9.6 oz (51.1 kg)  10/29/19 115 lb 12.8 oz (52.5 kg)  08/01/19 118 lb (53.5 kg)   Patient had EGD 2015 with Dr. Fuller Plan showed stricture and gastritis.  Colonoscopy 2018 shows diverticula but no infection, no polyps.  PET AB 04/2018 IMPRESSION: 1. No hypermetabolic activity or  pathologic adenopathy to suggest active malignancy. Prior right hilar activity shown on the prior PET-CT is no longer present. 2. Other imaging findings of potential clinical significance: Chronic left maxillary sinusitis. Aortic Atherosclerosis (ICD10-I70.0). Coronary atherosclerosis. Airway thickening is present, suggesting bronchitis or reactive airways disease. Sigmoid colon diverticulosis. Chronic pars defects at L5.  AB [place] 2016 IMPRESSION: Negative for gallstones. Splenomegaly 16 mm and 22 mm soft tissue masses in the region of the pancreas, favor adenopathy. CT abdomen with oral and IV contrast suggested further evaluation to exclude underlying pancreatic mass.  Lab Results  Component Value Date   TSH 2.34 10/29/2019    Blood pressure 122/76, pulse 73, temperature (!) 97.5 F (36.4 C), weight 112 lb 9.6 oz (51.1 kg), last menstrual period 09/14/2014, SpO2 97 %.  Medications  Current Outpatient Medications (Endocrine & Metabolic):  .  levothyroxine (SYNTHROID) 50 MCG tablet, Take 1 tablet daily on an empty stomach with only water for 30 minutes & no Antacid meds, Calcium or Magnesium for 4 hours & avoid Biotin   Current Outpatient Medications (Cardiovascular):  .  rosuvastatin (CRESTOR) 10 MG tablet, 1/4 tablet every day   Current Outpatient Medications (Respiratory):  .  PROAIR HFA 108 (90 Base) MCG/ACT inhaler, INHALE 2 PUFFS EVERY 4 HOURS AS NEEDED INTO THE LUNGS FOR WHEEZING OR SHORTNESS OF BREATH.   Facility-Administered Medications Ordered in Other Visits (Respiratory):  .  ipratropium-albuterol (DUONEB)  0.5-2.5 (3) MG/3ML nebulizer solution 3 mL  Current Outpatient Medications (Analgesics):  .  acetaminophen (TYLENOL) 325 MG tablet, Take 325 mg by mouth every 6 (six) hours as needed for moderate pain (For pain.).      Current Outpatient Medications (Other):  [name]  ALPRAZolam (XANAX) 0.5 MG tablet, Take 0.25 mg by mouth at bedtime as needed for anxiety. [name]   amoxicillin (AMOXIL) 500 MG capsule, TAKE 4 CAPSULES BY MOUTH ONE HOUR BEFORE DENTAL PROCEDURE. [name]  Cholecalciferol (VITAMIN D3) 50 MCG (2000 UT) capsule, Take 4,000 Units by mouth.  .  diazepam (VALIUM) 5 MG tablet, Take 5 mg by mouth every 6 (six) hours as needed for anxiety. Pt takes 2.5 mg as needed for sleep .  hyoscyamine (LEVSIN SL) 0.125 MG SL tablet, TAKE 1 TO 2 TABLETS BY MOUTH EVERY 4 HOURS AS NEEDED FOR NAUSEA, CRAMPING, BLOATING OR DIARRHEA .  lidocaine-prilocaine (EMLA) cream, APPLY TO PAC 1 HOUR BEFORE ACCESS. [name]  LORazepam (ATIVAN) 0.5 MG tablet, Take 1 tablet (0.5 mg total) by mouth every 6 (six) hours as needed (Nausea or vomiting). .  polyethylene glycol (MIRALAX / GLYCOLAX) 17 g packet, Take 17 g by mouth daily. [name]  Respiratory Therapy Supplies (FLUTTER) DEVI, Take as directed .  Zinc 50 MG CAPS, Take by mouth daily.  No current facility-administered medications for this visit.  Problem list She has Chronic obstructive airway disease with asthma ([place]); History of colonic polyps; Hyperlipidemia, mixed; Hypotension; GERD; Osteoporosis  (Refuses treatment); Osteoarthritis; Vitamin D deficiency; IBS; Diverticulosis; Anxiety state; OSA and COPD overlap syndrome ([place]); Medication management; Mantle cell lymphoma ([place]); Normocytic anemia; History of DVT (deep vein thrombosis); Body mass index (BMI) of 20.0-20.9 in adult; Obstructive bronchiectasis ([place]); Acquired hypogammaglobulinemia ([place]); Atherosclerosis of aorta ([place]); Sinusitis, maxillary, chronic; Cervical dystonia; Tremor; Hypothyroidism; Abnormal glucose; and Labile hypertension on their problem list.   Review of Systems  Constitutional: Positive for fatigue and unexpected weight change. Negative for activity change, appetite change, chills, diaphoresis and fever.  HENT: Negative.   Respiratory: Positive for chest tightness. Negative for apnea, cough, choking, shortness of breath, wheezing and stridor.   Cardiovascular:  Negative.  Negative for chest pain, palpitations and leg swelling.  Gastrointestinal: Positive for diarrhea, nausea and vomiting. Negative for abdominal distention, abdominal pain, anal bleeding, blood in stool, constipation and rectal pain.  Genitourinary: Negative.   Musculoskeletal: Negative.   Skin: Negative.  Negative for rash.  Neurological: Positive for tremors (unchanged). Negative for dizziness, seizures, syncope, facial asymmetry, speech difficulty, weakness, light-headedness, numbness and headaches.  Hematological: Negative.  Negative for adenopathy.  Psychiatric/Behavioral: Positive for decreased concentration and sleep disturbance. Negative for agitation, behavioral problems, confusion, dysphoric mood, hallucinations, self-injury and suicidal ideas. The patient is nervous/anxious. The patient is not hyperactive.        Objective:   Physical Exam General Appearance: Frail appearing elder, in no apparent distress. Eyes: PERRLA, EOMs, conjunctiva no swelling or erythema Sinuses: No Frontal/maxillary tenderness ENT/Mouth: Ext aud canals clear, TMs without erythema, bulging. No erythema, swelling, or exudate on post pharynx.  Tonsils not swollen or erythematous. Hearing normal.  Neck: Supple, thyroid normal.  Respiratory: Respiratory effort normal, BS equal bilaterally without rales, rhonchi, wheezing or stridor.  Cardio: RRR with no MRGs. Portacath on left chest Abdomen: Soft, + BS.  + epigastric tenderness, no guarding, rebound, hernias, masses. Lymphatics: Non tender without lymphadenopathy.  Musculoskeletal: Full ROM, 5/5 strength, Normal gait Skin: Warm, dry without rashes, lesions, ecchymosis.  Neuro: Cranial nerves intact. No  cerebellar symptoms.  Psych: Awake and oriented X 3, normal affect, Insight and Judgment appropriate.     Assessment & Plan:  Diagnoses and all orders for this visit:  Non-intractable vomiting with nausea, unspecified vomiting type with weight  loss -     famotidine (PEPCID) 40 MG tablet; Take 1 tablet (40 mg total) by mouth every evening. - some epigastric tenderness, will treat for gerd, check for pancreatitis, gallbladder, etc.  Please go to the ER if you have any severe AB pain, unable to hold down food/water, blood in stool or vomit, chest pain, shortness of breath, or any worsening symptoms.  -     Amylase -     Lipase -     US Abdomen Complete; Future - may need EGD with GI in the future if not better - no urinary symptoms  Insomnia, unspecified type/OSA -     TSH -     Ambulatory referral to Sleep Studies - follow back up for sleep study - unable to help the patient with sleep/fatigue and some of her issues unless she has treated OSA - will do lorazepam as needed for sleep study went over the dangers of sedative medications/benzo with OSA- will do sparingly- will do short term.   Medication management -     CBC with Differential/Platelet -     COMPLETE METABOLIC PANEL WITH GFR -     Magnesium  Hypothyroidism, unspecified type -     TSH  Iron deficiency -     Iron,Total/Total Iron Binding Cap  B12 deficiency -     Vitamin B12  The patient was advised to call immediately if she has any concerning symptoms in the interval. The patient voices understanding of current treatment options and is in agreement with the current care plan.The patient knows to call the clinic with any problems, questions or concerns or go to the ER if any further progression of symptoms.

## 2019-12-05 ENCOUNTER — Ambulatory Visit (INDEPENDENT_AMBULATORY_CARE_PROVIDER_SITE_OTHER): Payer: PPO | Admitting: Physician Assistant

## 2019-12-05 ENCOUNTER — Other Ambulatory Visit: Payer: Self-pay

## 2019-12-05 ENCOUNTER — Encounter: Payer: Self-pay | Admitting: Physician Assistant

## 2019-12-05 VITALS — BP 122/76 | HR 73 | Temp 97.5°F | Wt 112.6 lb

## 2019-12-05 DIAGNOSIS — R112 Nausea with vomiting, unspecified: Secondary | ICD-10-CM

## 2019-12-05 DIAGNOSIS — Z79899 Other long term (current) drug therapy: Secondary | ICD-10-CM

## 2019-12-05 DIAGNOSIS — C8310 Mantle cell lymphoma, unspecified site: Secondary | ICD-10-CM

## 2019-12-05 DIAGNOSIS — E039 Hypothyroidism, unspecified: Secondary | ICD-10-CM

## 2019-12-05 DIAGNOSIS — E611 Iron deficiency: Secondary | ICD-10-CM

## 2019-12-05 DIAGNOSIS — G4700 Insomnia, unspecified: Secondary | ICD-10-CM

## 2019-12-05 DIAGNOSIS — G4733 Obstructive sleep apnea (adult) (pediatric): Secondary | ICD-10-CM

## 2019-12-05 DIAGNOSIS — E538 Deficiency of other specified B group vitamins: Secondary | ICD-10-CM | POA: Diagnosis not present

## 2019-12-05 DIAGNOSIS — J449 Chronic obstructive pulmonary disease, unspecified: Secondary | ICD-10-CM

## 2019-12-05 MED ORDER — LORAZEPAM 0.5 MG PO TABS: 0.5000 mg | ORAL_TABLET | Freq: Three times a day (TID) | ORAL | 0 refills | Status: DC | PRN

## 2019-12-05 MED ORDER — FAMOTIDINE 40 MG PO TABS: 40.0000 mg | ORAL_TABLET | Freq: Every evening | ORAL | 1 refills | Status: DC

## 2019-12-05 NOTE — Patient Instructions (Addendum)
Suggest doing sleep study and getting treated for your sleep apnea  In the mean time stop the xanax and no diazepam, you can try the lorazepam for sleep until we get you a sleep study/CPAP machine- will refer  Will check labs.   Stop the zinc.   Take the pepcid daily for 1 month Will get [place] of your AB  YOU CAN CALL TO MAKE AN ULTRASOUND..  I have put in an order for an ultrasound for you to have You can set them up at your convenience by calling this number [phone] You will likely have the ultrasound at [address] [facility]  If you have any issues call our office and we will set this up for you.    Silent reflux: Not all heartburn burns...Marland KitchenMarland KitchenMarland Kitchen  What is LPR? Laryngopharyngeal reflux (LPR) or silent reflux is a condition in which acid that is made in the stomach travels up the esophagus (swallowing tube) and gets to the throat. Not everyone with reflux has a lot of heartburn or indigestion. In fact, many people with LPR never have heartburn. This is why LPR is called SILENT REFLUX, and the terms "Silent reflux" and "LPR" are often used interchangeably. Because LPR is silent, it is sometimes difficult to diagnose.  How can you tell if you have LPR?  [name] Chronic hoarseness- Some people have hoarseness that comes and goes . throat clearing  . Cough . It can cause shortness of breath and cause asthma like symptoms. [name] a feeling of a lump in the throat  . difficulty swallowing . a problem with too much nose and throat drainage.  . Some people will feel their esophagus spasm which feels like their heart beating hard and fast, this will usually be after a meal, at rest, or lying down at night.    How do I treat this? Treatment for LPR should be individualized, and your doctor will suggest the best treatment for you. Generally there are several treatments for LPR: . changing habits and diet to reduce reflux,  . medications to reduce stomach acid, and  . surgery to prevent  reflux. Most people with LPR need to modify how and when they eat, as well as take some medication, to get well. Sometimes, nonprescription liquid antacids, such as Maalox, Gelucil and Mylanta are recommended. When used, these antacids should be taken four times each day - one tablespoon one hour after each meal and before bedtime. Dietary and lifestyle changes alone are not often enough to control LPR - medications that reduce stomach acid are also usually needed. These must be prescribed by our doctor.   TIPS FOR REDUCING REFLUX AND LPR Control your LIFE-STYLE and your DIET! [name] If you use tobacco, QUIT.  [name] Smoking makes you reflux. After every cigarette you have some LPR.  . Don't wear clothing that is too tight, especially around the waist (trousers, corsets, belts).  . Do not lie down just after eating...in fact, do not eat within three hours of bedtime.  . You should be on a low-fat diet.  . Limit your intake of red meat.  . Limit your intake of butter.  [name] Avoid fried foods.  . Avoid chocolate  . Avoid cheese.  [name] Avoid eggs. [name] Specifically avoid caffeine (especially coffee and tea), soda pop (especially cola) and mints.  . Avoid alcoholic beverages, particularly in the evening.     NEW GUIDELINES FOR BENOZOS  New guidelines suggest the benzodiazepines are best  short term, with prolonged use they lead to physical and psychological dependence. In addition, evidence suggest that for insomnia the effectiveness wanes in 4 weeks and the risks out weight their benefits. Use of these agents have been associated with dementia, falls, motor vehicle accidents and physical addiction. Decreasing these medication have been proven to show improvements in cognition, alertness, decrease of falls and daytime sedation.   Goal is to get your sleep apnea treated to get you off the lorazepam or to only take it AS NEEDED!!!

## 2019-12-06 LAB — CBC WITH DIFFERENTIAL/PLATELET
Absolute Monocytes: 742 cells/uL (ref 200–950)
Basophils Absolute: 41 cells/uL (ref 0–200)
Basophils Relative: 0.7 %
Eosinophils Absolute: 412 cells/uL (ref 15–500)
Eosinophils Relative: 7.1 %
HCT: 40.8 % (ref 35.0–45.0)
Hemoglobin: 13.4 g/dL (ref 11.7–15.5)
Lymphs Abs: 1398 cells/uL (ref 850–3900)
MCH: 29.1 pg (ref 27.0–33.0)
MCHC: 32.8 g/dL (ref 32.0–36.0)
MCV: 88.7 fL (ref 80.0–100.0)
MPV: 9.9 fL (ref 7.5–12.5)
Monocytes Relative: 12.8 %
Neutro Abs: 3207 cells/uL (ref 1500–7800)
Neutrophils Relative %: 55.3 %
Platelets: 244 10*3/uL (ref 140–400)
RBC: 4.6 10*6/uL (ref 3.80–5.10)
RDW: 12.2 % (ref 11.0–15.0)
Total Lymphocyte: 24.1 %
WBC: 5.8 10*3/uL (ref 3.8–10.8)

## 2019-12-06 LAB — COMPLETE METABOLIC PANEL WITH GFR
AG Ratio: 2.5 (calc) (ref 1.0–2.5)
ALT: 14 U/L (ref 6–29)
AST: 25 U/L (ref 10–35)
Albumin: 4 g/dL (ref 3.6–5.1)
Alkaline phosphatase (APISO): 54 U/L (ref 37–153)
BUN: 15 mg/dL (ref 7–25)
CO2: 29 mmol/L (ref 20–32)
Calcium: 9.5 mg/dL (ref 8.6–10.4)
Chloride: 101 mmol/L (ref 98–110)
Creat: 0.82 mg/dL (ref 0.60–0.93)
GFR, Est African American: 81 mL/min/{1.73_m2} (ref >=60)
GFR, Est Non African American: 70 mL/min/{1.73_m2} (ref >=60)
Globulin: 1.6 g/dL (calc) — ABNORMAL LOW (ref 1.9–3.7)
Glucose, Bld: 147 mg/dL — ABNORMAL HIGH (ref 65–99)
Potassium: 4.4 mmol/L (ref 3.5–5.3)
Sodium: 136 mmol/L (ref 135–146)
Total Bilirubin: 0.4 mg/dL (ref 0.2–1.2)
Total Protein: 5.6 g/dL — ABNORMAL LOW (ref 6.1–8.1)

## 2019-12-06 LAB — IRON, TOTAL/TOTAL IRON BINDING CAP
%SAT: 14 % (calc) — ABNORMAL LOW (ref 16–45)
Iron: 40 ug/dL — ABNORMAL LOW (ref 45–160)
TIBC: 292 mcg/dL (calc) (ref 250–450)

## 2019-12-06 LAB — LIPASE: Lipase: 58 U/L (ref 7–60)

## 2019-12-06 LAB — VITAMIN B12: Vitamin B-12: 733 pg/mL (ref 200–1100)

## 2019-12-06 LAB — TSH: TSH: 1.72 mIU/L (ref 0.40–4.50)

## 2019-12-06 LAB — AMYLASE: Amylase: 44 U/L (ref 21–101)

## 2019-12-06 LAB — MAGNESIUM: Magnesium: 2 mg/dL (ref 1.5–2.5)

## 2019-12-07 ENCOUNTER — Other Ambulatory Visit: Payer: Self-pay

## 2019-12-07 ENCOUNTER — Inpatient Hospital Stay: Payer: PPO | Attending: Hematology & Oncology

## 2019-12-07 DIAGNOSIS — C8312 Mantle cell lymphoma, intrathoracic lymph nodes: Secondary | ICD-10-CM | POA: Diagnosis not present

## 2019-12-07 DIAGNOSIS — Z452 Encounter for adjustment and management of vascular access device: Secondary | ICD-10-CM | POA: Insufficient documentation

## 2019-12-07 NOTE — Patient Instructions (Signed)

## 2019-12-11 ENCOUNTER — Ambulatory Visit
Admission: RE | Admit: 2019-12-11 | Discharge: 2019-12-11 | Disposition: A | Discharge status: Home / Self Care | Payer: PPO | Source: Ambulatory Visit | Attending: Physician Assistant | Admitting: Physician Assistant

## 2019-12-11 DIAGNOSIS — R112 Nausea with vomiting, unspecified: Secondary | ICD-10-CM | POA: Diagnosis not present

## 2019-12-13 ENCOUNTER — Other Ambulatory Visit: Payer: Self-pay

## 2019-12-13 ENCOUNTER — Ambulatory Visit
Admission: RE | Admit: 2019-12-13 | Discharge: 2019-12-13 | Disposition: A | Discharge status: Home / Self Care | Payer: PPO | Source: Ambulatory Visit | Attending: Internal Medicine | Admitting: Internal Medicine

## 2019-12-13 DIAGNOSIS — M810 Age-related osteoporosis without current pathological fracture: Secondary | ICD-10-CM

## 2019-12-13 DIAGNOSIS — Z780 Asymptomatic menopausal state: Secondary | ICD-10-CM | POA: Diagnosis not present

## 2020-01-07 ENCOUNTER — Ambulatory Visit: Payer: PPO | Admitting: Neurology

## 2020-01-15 ENCOUNTER — Ambulatory Visit: Payer: PPO | Admitting: Neurology

## 2020-01-15 ENCOUNTER — Encounter: Payer: Self-pay | Admitting: Neurology

## 2020-01-15 ENCOUNTER — Other Ambulatory Visit: Payer: Self-pay

## 2020-01-15 VITALS — BP 125/75 | HR 67 | Temp 97.7°F | Ht 63.75 in | Wt 110.0 lb

## 2020-01-15 DIAGNOSIS — J4542 Moderate persistent asthma with status asthmaticus: Secondary | ICD-10-CM

## 2020-01-15 DIAGNOSIS — G4733 Obstructive sleep apnea (adult) (pediatric): Secondary | ICD-10-CM | POA: Diagnosis not present

## 2020-01-15 DIAGNOSIS — J984 Other disorders of lung: Secondary | ICD-10-CM | POA: Diagnosis not present

## 2020-01-15 DIAGNOSIS — J470 Bronchiectasis with acute lower respiratory infection: Secondary | ICD-10-CM | POA: Diagnosis not present

## 2020-01-15 NOTE — Progress Notes (Signed)
SLEEP MEDICINE CLINIC   Provider:  Larey Seat, M D  Primary Care Physician:  Unk Pinto, MD   Referring Provider: Unk Pinto, MD    Chief Complaint  Patient presents with  . Follow-up    pt alone, rm 10, pt states that she was doing better for a while but now she is finding that she is having difficulty with sleep again. she states wakes up every 2 hrs. never completed SS through our office.     HPI:  ANEZKA STILTNER is a 79 y.o. female patient , Whom I have encountered last in November 2019 so about 18 months ago.  At the time she felt that her sleep had improved to a level where another evaluation would not be needed.  My diagnosis at the time was a breathing disorder related to chemotherapy induced lung changes and she has followed Dr. Lavone Neri as a pulmonologist.  She is meanwhile 79 years old, has a history of hypertension, GERD, prediabetes, diverticulosis, generalized anxiety, cholesterol and vitamin D and is being treated almost being treated for a mantle cell carcinoma is Dr. Burney Gauze.  The treatment has been  complicated when she developed a deep venous thrombosis.   She had received the Covid vaccine in February, she has been now fully vaccinated.  For the last 3 to 6 months she has not been feeling well it may by now be 6 to 8 months.  Her main problem is trouble sleeping and her CPAP has not been used in over 2 years.  There was a good reason for her to avoid CPAP as she felt that it increased her frequency of bronchitic infections.  She has often sporadic nausea and a feeling of being sick to her stomach vomiting sometimes and she has felt that lorazepam helped to overcome nausea as well as anxiety and introducing sleep.  These nausea spells happen not infrequently about 4 hours after she had a meal and she does not think it specific to any foods.  She has no trouble swallowing food.    Since she has a history of now untreated sleep apnea ( she is not on a  CPAP)  her primary care office , notable PA Vicie Mutters, wanted her sleep reevaluated.   The patient has been living with a lot more stress, neurology and anxiety since her husband has dementia symptoms. Her main concern is insomnia, not physically related to any breathing difficulties but to IBS, frequent stools at night , and causing anxiety. She feels overwhelmed by taking on his appointments, his financial papers, the tax returns.   She goes to bed at 10 and often is still awake at midnight, every 2 hours waking up, and sometimes being unable to return to sleep for an hour in between, she is exhausted.   She was seen here on 07-20-2018 in a referral  from Dr. Melford Aase for transfer of sleep apnea care.  Chief complaint according to patient : " I don't like using it ( CPAP ), and I do have frequent Bronchitis. I have lymphoma. I never saw a sleep doctor and was given an AUTO CPAP without titration. My baseline AHI 5 years ago was 16.0 / h". "I may not need it anymore ? " I had the pleasure of seeing [name] at [facility] on the 20 July 2018, as a new patient to my sleep clinic.   She states that over 5 years ago she had undergone an overnight polysomnographic study  at [facility] long hospital location, interpreted by Dr. Baird Lyons.  She recalls that she had only mild apnea at an AHI of 16/h and was prescribed an auto CPAP soon after.  She had used the machine compliantly for several years but then developed more and more often upper airway infections bronchitis and sinusitis and felt that this may be triggered by CPAP use.  Just by end of September she restarted using CPAP to give [place] some data to.  From 20 September through October she had used the machine 31 out of 35 days with an average daily use of time of 5 hours and 19 minutes, set pressure of 10 cm is seen on her download with 3 cm EPR and very high air leaks are noted.  The residual AHI is only 0.2/h a good resolution of apnea as noted  but the air leaks are quite substantial.  She is using nasal pillows that were fitted especially for her to be worn with loops around the ears rather than a strap on the back of her head.   Sleep/ Medical history: 1)The patient had developed a plethora of new medical problems over the last 5 to 7 years.  She was diagnosed with atherosclerosis of the ascending aorta,  2)chronic obstructive airway disease with asthma -she is using inhalers as needed and continues to follow-up with pulmonary care.   3) hypotension blood pressure at home is as low as 90/60 mmHg.  She did continue with the DASH diet restricting her sodium intake, she does keep hydrated.   4)She has hypercholesterolemia on rosuvastatin-Crestor.  5) Prediabetes.  6) Hypothyroidism. 7) Vitamin D deficiency.   8)The patient was diagnosed in January 2016 with a mantle cell Lymphoma and by September 2016 was involved and chemotherapy with Dr. Marin Olp and maintained through November 2018 she did achieve remission.  Dr. Melvyn Novas  has followed her for frequent pulmonary infections with diagnosis of obstructive bronchiectasis and asthma.  Her BMI is 21.45. 9) Tremor evaluation , had it for years, seen by R.Tat, DO- she confirmed is was not PD, but essential tremor.  10) asthma all my life. I reviewed her medication and her recent lab results which included TSH of 2.73, a vit D  level of 55, HbA1c at 5.8, total cholesterol 241.   Sleep habits are as follows: Dinner time at 6 PM, bedtime is 10 PM. The bedroom is cool, quiet and dark- no TV in the bedroom.  She has preferred reading in bed, she sleeps supine since using CPAP, on two pillows. Most nights asleep within 30 minutes. She has one nocturia , and goes back to sleep- while not using CPAP recently. No gasping, no palpitation, no chest pains.  She rises at 7 AM , feeling now refreshed and better than when she used CPAP. ( Recovering from bronchitis, rhinitis, and asthma. She was given Rituximab -  immune suppressed).  Rarely napping in daytime. If  Taking a nap it will last 1 hour.    Family sleep history: no family members with sleep apnea.   Social history: married, childless, husband has health problems, too. No pets in the home.  She drinks coffee 2 cups a day, iced tea rarely- decaff only, and sodas - none.  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    Review of Systems: Out of a complete 14 system review, the patient complains of only the following symptoms, and all other reviewed systems are negative.   The patient  reports that she sometimes has insomnia but it is usually when she is under stress or something worries her a lot, she knows that she is snoring but she has not complained of snoring herself awake or feeling that snoring impairs her quality of sleep.   She has high cholesterol but had no vascular or arterial disease is related to it.   She had lymphoma was diagnosed 5 years ago and is in remission.   She has not used her CPAP.  Epworth score 2/ 24  , Fatigue severity score 18/ 63   , depression score 5/15    Social History   Socioeconomic History  . Marital status: Married    Spouse name: Not on file  . Number of children: 0  . Years of education: 60  . Highest education level: Not on file  Occupational History  . Occupation: retired from office work  Tobacco Use  . Smoking status: Never Smoker  . Smokeless tobacco: Never Used  . Tobacco comment: Never Used Tobacco  Substance and Sexual Activity  . Alcohol use: No    Alcohol/week: 0.0 standard drinks  . Drug use: No  . Sexual activity: Not on file  Other Topics Concern  . Not on file  Social History Narrative   Patient lives with her husband in a two story home.  Has no children.  Retired from office work.  Education: some college.    Social Determinants of Health   Financial Resource Strain:   . Difficulty of Paying Living Expenses:   Food Insecurity:   . Worried About Ship broker in the Last Year:   . Arboriculturist in the Last Year:   Transportation Needs:   . Film/video editor (Medical):   [name] Lack of Transportation (Non-Medical):   Physical Activity:   . Days of Exercise per Week:   . Minutes of Exercise per Session:   Stress:   . Feeling of Stress :   Social Connections:   . Frequency of Communication with Friends and Family:   . Frequency of Social Gatherings with Friends and Family:   . Attends Religious Services:   . Active Member of Clubs or Organizations:   . Attends Archivist Meetings:   [name] Marital Status:   Intimate Partner Violence:   . Fear of Current or Ex-Partner:   . Emotionally Abused:   [name] Physically Abused:   . Sexually Abused:     Family History  Problem Relation Age of Onset  . Emphysema Father   . Heart disease Father   . Heart attack Father 91       Died  . Heart disease Brother 81  . Rheum arthritis Sister   . Hypertension Mother   . Thyroid disease Mother   . Colon cancer Neg Hx   . Stomach cancer Neg Hx     Past Medical History:  Diagnosis Date  . Adenomatous colon polyp 1994  . Asthma   . Cataract 2013   bilateral eyes  . Diverticulosis   . Family history of ischemic heart disease   . Fibrocystic breast disease   . GERD (gastroesophageal reflux disease)   . Hiatal hernia   . Hyperlipidemia   . Hypertension    pt denies - 01/16/18  . IBS (irritable bowel syndrome)   . Internal hemorrhoids   . Mantle cell lymphoma ([place]) 10/18/2014  . Melanoma ([place])    Facial, pt denies on 01/16/18  . OSA (  obstructive sleep apnea)   . Osteoarthritis   . Osteoporosis   . Prediabetes   . Unspecified hypothyroidism   . Vitamin D deficiency     Past Surgical History:  Procedure Laterality Date  . APPENDECTOMY    . BUNIONECTOMY    . CATARACT EXTRACTION, BILATERAL    . KNEE ARTHROSCOPY    . TONSILLECTOMY    . UMBILICAL HERNIA REPAIR    . VIDEO BRONCHOSCOPY Bilateral 10/12/2017   Procedure: VIDEO  BRONCHOSCOPY WITHOUT FLUORO;  Surgeon: Tanda Rockers, MD;  Location: [facility] ENDOSCOPY;  Service: Endoscopy;  Laterality: Bilateral;    Current Outpatient Medications  Medication Sig Dispense Refill  . acetaminophen (TYLENOL) 325 MG tablet Take 325 mg by mouth every 6 (six) hours as needed for moderate pain (For pain.).     [name] amoxicillin (AMOXIL) 500 MG capsule TAKE 4 CAPSULES BY MOUTH ONE HOUR BEFORE DENTAL PROCEDURE. 8 capsule 0  . Cholecalciferol (VITAMIN D3) 50 MCG (2000 UT) capsule Take 4,000 Units by mouth.     . hyoscyamine (LEVSIN SL) 0.125 MG SL tablet TAKE 1 TO 2 TABLETS BY MOUTH EVERY 4 HOURS AS NEEDED FOR NAUSEA, CRAMPING, BLOATING OR DIARRHEA 90 tablet 11  . levothyroxine (SYNTHROID) 50 MCG tablet Take 1 tablet daily on an empty stomach with only water for 30 minutes & no Antacid meds, Calcium or Magnesium for 4 hours & avoid Biotin 90 tablet 3  . lidocaine-prilocaine (EMLA) cream APPLY TO PAC 1 HOUR BEFORE ACCESS. 30 g 3  . LORazepam (ATIVAN) 0.5 MG tablet Take 1 tablet (0.5 mg total) by mouth every 8 (eight) hours as needed for sleep (Nausea or vomiting). 60 tablet 0  . polyethylene glycol (MIRALAX / GLYCOLAX) 17 g packet Take 17 g by mouth daily. 30 each 3  . PROAIR HFA 108 (90 Base) MCG/ACT inhaler INHALE 2 PUFFS EVERY 4 HOURS AS NEEDED INTO THE LUNGS FOR WHEEZING OR SHORTNESS OF BREATH. 48 g 1  . Respiratory Therapy Supplies (FLUTTER) DEVI Take as directed 1 each 0  . rosuvastatin (CRESTOR) 10 MG tablet 1/4 tablet every day 30 tablet 3   No current facility-administered medications for this visit.   Facility-Administered Medications Ordered in Other Visits  Medication Dose Route Frequency Provider Last Rate Last Admin  . ipratropium-albuterol (DUONEB) 0.5-2.5 (3) MG/3ML nebulizer solution 3 mL  3 mL Nebulization Q6H [place], Holli Humbles, NP        Allergies as of 01/15/2020 - Review Complete 01/15/2020  Allergen Reaction Noted  . Alendronate Other (See Comments) 09/15/2013  .  Risedronate sodium Other (See Comments) 09/15/2013  . Sulfa antibiotics Other (See Comments) 09/15/2013  . Sulfasalazine Other (See Comments) 09/15/2013  . Other Rash 03/16/2011  . Tetracyclines & related Rash 03/16/2011    Vitals: BP 125/75   Pulse 67   Temp 97.7 F (36.5 C)   Ht 5' 3.75" (1.619 m)   Wt 110 lb (49.9 kg)   LMP 09/14/2014   BMI 19.03 kg/m  Last Weight:  Wt Readings from Last 1 Encounters:  01/15/20 110 lb (49.9 kg)   [id number] mass index is 19.03 kg/m.     Last Height:   Ht Readings from Last 1 Encounters:  01/15/20 5' 3.75" (1.619 m)    Physical exam:  General: The patient is awake, alert and appears not in acute distress. The patient is well groomed. Head: Normocephalic, atraumatic. Titubation noted. Neck is supple. Mallampati 3, uvula deviated to the right.  neck circumference:14. 75. Nasal airflow  patent - just recovering form congestion. ,  Retrognathia is seen.  Cardiovascular:  Regular rate and rhythm, without  murmurs or carotid bruit, and without distended neck veins. Respiratory: Lungs are clear to auscultation. Skin:  Without evidence of edema, or rash Trunk: BMI is 21.62. The patient's posture is erect - very erect.   Neurologic exam :The patient is awake and alert, oriented to place and time.   Memory subjective  described as intact.  Attention span & concentration ability appears normal.  Speech is fluent,  without  dysarthria, mild dysphonia , no aphasia.  Mood and affect are defensive, slightly aggressive.   Cranial nerves: Pupils are equal and briskly reactive to light. Funduscopic exam without evidence of pallor or edema. Extraocular movements  in vertical and horizontal planes intact and without nystagmus. Visual fields by finger perimetry are intact. Hearing to finger rub intact. Facial sensation intact to fine touch. Facial motor strength is symmetric with a left sided mild ptosis.   Her tongue and uvula without tremor- tongue is   midline. Uvula is slightly deviated to the right.  Shoulder shrug affected by cervical dystonia.  Motor exam:  Normal tone, muscle bulk and symmetric strength in all extremities. Deep tendon reflexes: in the  upper and lower extremities are symmetric and intact. Babinski maneuver response is downgoing.  Assessment:   1) Mrs. Chappelle is a pleasant 79 year old Caucasian right-handed female patient with a history of non parkinsonian  tremor, a diagnosis of cervical dystonia, lymphoma in remission, lifelong asthmatic, but she developed bronchiectasis and restrictive airway disease to a much greater degree likely associated with chemotherapy the treatment of lymphoma.  She has been diagnosed with a mild sleep apnea before the lymphoma was of unknown and for years CPAP did her well but now she feels that CPAP actually contributes to recurrent airway infections.   If the patient's apnea has actually remained mild and is not associated with oxygen desaturation I would encourage her to get off the CPAP machine.  She has a significant degree of red prognathia, she is slender she has a sling neck and I think she will be helped with a dental device that would advance the mandible forward rather than using CPAP if her AHI is 10 or less. She was much heavier at the time of he last sleep study.  Should she need CPAP we will urge her to use a so clean device as well.    Plan:  Treatment plan and additional workup : HST per patient's request.  Insomnia is likely unrelated to neurological or organic problems, I will not provide ativan.  I do need a baseline AHI to make recommendations.   Larey Seat, MD [id number], [id number] PM  Certified in Neurology by ABPN Certified in Sleep Medicine by [facility] [address] [place], [place] [address]

## 2020-01-15 NOTE — Patient Instructions (Signed)
Please remember to try to maintain good sleep hygiene, which means: Keep a regular sleep and wake schedule, try not to exercise or have a meal within 2 hours of your bedtime, try to keep your bedroom conducive for sleep, that is, cool and dark, without light distractors such as an illuminated alarm clock, and refrain from watching TV right before sleep or in the middle of the night and do not keep the TV or radio on during the night. Also, try not to use or play on electronic devices at bedtime, such as your cell phone, tablet PC or laptop. If you like to read at bedtime on an electronic device, try to dim the background light as much as possible. Do not eat in the middle of the night.   We will request a sleep study.    We will look for leg twitching and snoring or sleep apnea.   For chronic insomnia, you are best followed by a psychiatrist and/or sleep psychologist.

## 2020-01-28 ENCOUNTER — Inpatient Hospital Stay: Payer: PPO

## 2020-01-28 ENCOUNTER — Other Ambulatory Visit: Payer: Self-pay

## 2020-01-28 ENCOUNTER — Inpatient Hospital Stay (HOSPITAL_BASED_OUTPATIENT_CLINIC_OR_DEPARTMENT_OTHER): Payer: PPO | Admitting: Hematology & Oncology

## 2020-01-28 ENCOUNTER — Telehealth: Payer: Self-pay | Admitting: Hematology & Oncology

## 2020-01-28 ENCOUNTER — Inpatient Hospital Stay: Payer: PPO | Attending: Hematology & Oncology

## 2020-01-28 ENCOUNTER — Encounter: Payer: Self-pay | Admitting: Hematology & Oncology

## 2020-01-28 VITALS — BP 131/69 | HR 64 | Temp 97.1°F | Resp 17 | Wt 112.8 lb

## 2020-01-28 DIAGNOSIS — J479 Bronchiectasis, uncomplicated: Secondary | ICD-10-CM | POA: Insufficient documentation

## 2020-01-28 DIAGNOSIS — Z79899 Other long term (current) drug therapy: Secondary | ICD-10-CM | POA: Insufficient documentation

## 2020-01-28 DIAGNOSIS — Z95828 Presence of other vascular implants and grafts: Secondary | ICD-10-CM

## 2020-01-28 DIAGNOSIS — Z86718 Personal history of other venous thrombosis and embolism: Secondary | ICD-10-CM | POA: Insufficient documentation

## 2020-01-28 DIAGNOSIS — C8313 Mantle cell lymphoma, intra-abdominal lymph nodes: Secondary | ICD-10-CM

## 2020-01-28 DIAGNOSIS — D801 Nonfamilial hypogammaglobulinemia: Secondary | ICD-10-CM | POA: Diagnosis not present

## 2020-01-28 DIAGNOSIS — C8312 Mantle cell lymphoma, intrathoracic lymph nodes: Secondary | ICD-10-CM | POA: Diagnosis not present

## 2020-01-28 LAB — CMP (CANCER CENTER ONLY)
ALT: 14 U/L (ref 0–44)
AST: 24 U/L (ref 15–41)
Albumin: 4.3 g/dL (ref 3.5–5.0)
Alkaline Phosphatase: 55 U/L (ref 38–126)
Anion gap: 6 (ref 5–15)
BUN: 14 mg/dL (ref 8–23)
CO2: 28 mmol/L (ref 22–32)
Calcium: 9.6 mg/dL (ref 8.9–10.3)
Chloride: 103 mmol/L (ref 98–111)
Creatinine: 0.85 mg/dL (ref 0.44–1.00)
GFR, Est AFR Am: >60 mL/min (ref >60)
GFR, Estimated: >60 mL/min (ref >60)
Glucose, Bld: 97 mg/dL (ref 70–99)
Potassium: 4.3 mmol/L (ref 3.5–5.1)
Sodium: 137 mmol/L (ref 135–145)
Total Bilirubin: 0.5 mg/dL (ref 0.3–1.2)
Total Protein: 5.8 g/dL — ABNORMAL LOW (ref 6.5–8.1)

## 2020-01-28 LAB — CBC WITH DIFFERENTIAL (CANCER CENTER ONLY)
Abs Immature Granulocytes: 0.01 10*3/uL (ref 0.00–0.07)
Basophils Absolute: 0.1 10*3/uL (ref 0.0–0.1)
Basophils Relative: 2 %
Eosinophils Absolute: 0.2 10*3/uL (ref 0.0–0.5)
Eosinophils Relative: 3 %
HCT: 41.3 % (ref 36.0–46.0)
Hemoglobin: 13.6 g/dL (ref 12.0–15.0)
Immature Granulocytes: 0 %
Lymphocytes Relative: 29 %
Lymphs Abs: 1.6 10*3/uL (ref 0.7–4.0)
MCH: 29.2 pg (ref 26.0–34.0)
MCHC: 32.9 g/dL (ref 30.0–36.0)
MCV: 88.6 fL (ref 80.0–100.0)
Monocytes Absolute: 0.6 10*3/uL (ref 0.1–1.0)
Monocytes Relative: 11 %
Neutro Abs: 3.1 10*3/uL (ref 1.7–7.7)
Neutrophils Relative %: 55 %
Platelet Count: 191 10*3/uL (ref 150–400)
RBC: 4.66 MIL/uL (ref 3.87–5.11)
RDW: 14.5 % (ref 11.5–15.5)
WBC Count: 5.5 10*3/uL (ref 4.0–10.5)
nRBC: 0 % (ref 0.0–0.2)

## 2020-01-28 LAB — LACTATE DEHYDROGENASE: LDH: 181 U/L (ref 98–192)

## 2020-01-28 MED ORDER — HEPARIN SOD (PORK) LOCK FLUSH 100 UNIT/ML IV SOLN
500.0000 [IU] | Freq: Once | INTRAVENOUS | Status: AC
  Administered 2020-01-28: 500 [IU] via INTRAVENOUS
  Filled 2020-01-28: qty 5

## 2020-01-28 MED ORDER — SODIUM CHLORIDE 0.9% FLUSH
10.0000 mL | Freq: Once | INTRAVENOUS | Status: AC
  Administered 2020-01-28: 10 mL via INTRAVENOUS
  Filled 2020-01-28: qty 10

## 2020-01-28 NOTE — Progress Notes (Signed)
Hematology and Oncology Follow Up Visit  Shannon Parsons [id number] October 18, 1940 79 y.o. 01/28/2020   Principle Diagnosis:   Mantle cell lymphoma (MCIPI = 5)  Thromboembolic event of the right leg  Acquired Hypogammaglobulinemia  Bronchiectasis  Current Therapy:    Rituxan - maintanence - s/p cycle #10 - to finish in 07/2017  IVIG - q  month -restart on 10/27/2017; last dose 01/05/2018     Interim History:    Shannon Parsons now comes in for follow-up.  We saw her 6 months ago.  She seems to be holding her own.  Unfortunately, her husband has been having problems.  Sounds like he has more in the way of dementia.  I just feel bad for Shannon Parsons.  She is doing her best to try to manage this.  She would like to have her Port-A-Cath out.  She has done well with the Port-A-Cath.  She has not had any problems with the mantle cell lymphoma for probably 5 years.  I think that the risk of the mantle cell lymphoma coming back is probably going to be less than 15%.  She has had no issues with nausea or vomiting.  She has had no change in bowel or bladder habits.  There is been no cough or shortness of breath.  She has had her coronavirus vaccines.  There is been no fever.  She has had no bleeding.  Overall, her performance status is ECOG 1.   Medications:  Current Outpatient Medications:  .  acetaminophen (TYLENOL) 325 MG tablet, Take 325 mg by mouth every 6 (six) hours as needed for moderate pain (For pain.). , Disp: , Rfl:  .  amoxicillin (AMOXIL) 500 MG capsule, TAKE 4 CAPSULES BY MOUTH ONE HOUR BEFORE DENTAL PROCEDURE., Disp: 8 capsule, Rfl: 0 .  Cholecalciferol (VITAMIN D3) 50 MCG (2000 UT) capsule, Take 4,000 Units by mouth. , Disp: , Rfl:  .  hyoscyamine (LEVSIN SL) 0.125 MG SL tablet, TAKE 1 TO 2 TABLETS BY MOUTH EVERY 4 HOURS AS NEEDED FOR NAUSEA, CRAMPING, BLOATING OR DIARRHEA, Disp: 90 tablet, Rfl: 11 .  levothyroxine (SYNTHROID) 50 MCG tablet, Take 1 tablet daily on an empty  stomach with only water for 30 minutes & no Antacid meds, Calcium or Magnesium for 4 hours & avoid Biotin, Disp: 90 tablet, Rfl: 3 .  lidocaine-prilocaine (EMLA) cream, APPLY TO PAC 1 HOUR BEFORE ACCESS., Disp: 30 g, Rfl: 3 .  LORazepam (ATIVAN) 0.5 MG tablet, Take 1 tablet (0.5 mg total) by mouth every 8 (eight) hours as needed for sleep (Nausea or vomiting)., Disp: 60 tablet, Rfl: 0 .  polyethylene glycol (MIRALAX / GLYCOLAX) 17 g packet, Take 17 g by mouth daily., Disp: 30 each, Rfl: 3 .  PROAIR HFA 108 (90 Base) MCG/ACT inhaler, INHALE 2 PUFFS EVERY 4 HOURS AS NEEDED INTO THE LUNGS FOR WHEEZING OR SHORTNESS OF BREATH., Disp: 48 g, Rfl: 1 .  Respiratory Therapy Supplies (FLUTTER) DEVI, Take as directed, Disp: 1 each, Rfl: 0 .  rosuvastatin (CRESTOR) 10 MG tablet, 1/4 tablet every day, Disp: 30 tablet, Rfl: 3 No current facility-administered medications for this visit.  Facility-Administered Medications Ordered in Other Visits:  .  ipratropium-albuterol (DUONEB) 0.5-2.5 (3) MG/3ML nebulizer solution 3 mL, 3 mL, Nebulization, Q6H, [place], Holli Humbles, NP  Allergies:  Allergies  Allergen Reactions  . Alendronate Other (See Comments)    "made me choke"    . Risedronate Sodium Other (See Comments)    "made me choke"    .  Sulfa Antibiotics Other (See Comments)    Doesn't remember   . Sulfasalazine Other (See Comments)    Doesn't remember   . Other Rash  . Tetracyclines & Related Rash    Past Medical History, Surgical history, Social history, and Family History were reviewed and updated.  Review of Systems: Review of Systems  Constitutional: Positive for malaise/fatigue.  HENT: Positive for congestion.   Eyes: Negative.   Respiratory: Positive for shortness of breath and wheezing.   Cardiovascular: Negative.   Gastrointestinal: Negative.   Genitourinary: Negative.   Musculoskeletal: Negative.   Skin: Negative.   Neurological: Negative.   Endo/Heme/Allergies: Negative.     Psychiatric/Behavioral: Negative.     Physical Exam:  weight is 112 lb 12 oz (51.1 kg). Her temporal temperature is 97.1 F (36.2 C) (abnormal). Her blood pressure is 131/69 and her pulse is 64. Her respiration is 17 and oxygen saturation is 98%.   Physical Exam Vitals reviewed.  HENT:     Head: Normocephalic and atraumatic.  Eyes:     Pupils: Pupils are equal, round, and reactive to light.  Cardiovascular:     Rate and Rhythm: Normal rate and regular rhythm.     Heart sounds: Normal heart sounds.  Pulmonary:     Effort: Pulmonary effort is normal.     Breath sounds: Normal breath sounds.  Abdominal:     General: Bowel sounds are normal.     Palpations: Abdomen is soft.  Musculoskeletal:        General: No tenderness or deformity. Normal range of motion.     Cervical back: Normal range of motion.  Lymphadenopathy:     Cervical: No cervical adenopathy.  Skin:    General: Skin is warm and dry.     Findings: No erythema or rash.  Neurological:     Mental Status: She is alert and oriented to person, place, and time.  Psychiatric:        Behavior: Behavior normal.        Thought Content: Thought content normal.        Judgment: Judgment normal.      Lab Results  Component Value Date   WBC 5.5 01/28/2020   HGB 13.6 01/28/2020   HCT 41.3 01/28/2020   MCV 88.6 01/28/2020   PLT 191 01/28/2020     Chemistry      Component Value Date/Time   NA 137 01/28/2020 1142   NA 141 07/13/2017 0743   NA 138 08/01/2015 1124   K 4.3 01/28/2020 1142   K 4.2 07/13/2017 0743   K 4.0 08/01/2015 1124   CL 103 01/28/2020 1142   CL 105 07/13/2017 0743   CO2 28 01/28/2020 1142   CO2 27 07/13/2017 0743   CO2 28 08/01/2015 1124   BUN 14 01/28/2020 1142   BUN 16 07/13/2017 0743   BUN 10.4 08/01/2015 1124   CREATININE 0.85 01/28/2020 1142   CREATININE 0.82 12/05/2019 1511   CREATININE 0.8 08/01/2015 1124      Component Value Date/Time   CALCIUM 9.6 01/28/2020 1142   CALCIUM 9.5  07/13/2017 0743   CALCIUM 9.6 08/01/2015 1124   ALKPHOS 55 01/28/2020 1142   ALKPHOS 68 07/13/2017 0743   ALKPHOS 88 08/01/2015 1124   AST 24 01/28/2020 1142   AST 23 08/01/2015 1124   ALT 14 01/28/2020 1142   ALT 19 07/13/2017 0743   ALT 12 08/01/2015 1124   BILITOT 0.5 01/28/2020 1142   BILITOT <0.30 08/01/2015 1124  Impression and Plan: Ms. Flaugher is 79 year old white female. She has mantle cell lymphoma. Again, by the [facility] she is an intermediate risk.  She received 2 different courses of chemotherapy. She could not tolerate R-CHOP with Velcade substituting for vincristine. This however was incredibly helpful. We finished up with Rituxan/bendamustine. She completed this in August 2016.  We then started maintenance Rituxan. She started this in November 2016.  She completed this in November 2018.    I do not see any indication for scans.  Her exam is unremarkable.  Her lab work looks Chief Technology Officer.  We will have interventional radiology take out the Port-A-Cath.  We will plan to see her back in 6 months.  Volanda Napoleon, MD 5/17/202112:32 PM

## 2020-01-28 NOTE — Telephone Encounter (Signed)
Appointments scheduled calendar printed per 5/17 los

## 2020-01-29 ENCOUNTER — Telehealth (HOSPITAL_COMMUNITY): Payer: Self-pay

## 2020-01-29 ENCOUNTER — Other Ambulatory Visit (HOSPITAL_COMMUNITY): Payer: Self-pay | Admitting: Hematology & Oncology

## 2020-01-29 DIAGNOSIS — C8313 Mantle cell lymphoma, intra-abdominal lymph nodes: Secondary | ICD-10-CM

## 2020-01-29 LAB — PROTEIN ELECTROPHORESIS, SERUM, WITH REFLEX
A/G Ratio: 2 — ABNORMAL HIGH (ref 0.7–1.7)
Albumin ELP: 4 g/dL (ref 2.9–4.4)
Alpha-1-Globulin: 0.2 g/dL (ref 0.0–0.4)
Alpha-2-Globulin: 0.5 g/dL (ref 0.4–1.0)
Beta Globulin: 0.8 g/dL (ref 0.7–1.3)
Gamma Globulin: 0.4 g/dL (ref 0.4–1.8)
Globulin, Total: 2 g/dL — ABNORMAL LOW (ref 2.2–3.9)
Total Protein ELP: 6 g/dL (ref 6.0–8.5)

## 2020-01-29 LAB — IGG, IGA, IGM
IgA: 19 mg/dL — ABNORMAL LOW (ref 64–422)
IgG (Immunoglobin G), Serum: 516 mg/dL — ABNORMAL LOW (ref 586–1602)
IgM (Immunoglobulin M), Srm: 41 mg/dL (ref 26–217)

## 2020-01-29 NOTE — Telephone Encounter (Signed)
Called to schedule port removal, no answer, no vm. AW

## 2020-01-30 ENCOUNTER — Ambulatory Visit: Payer: PPO | Admitting: Physician Assistant

## 2020-02-01 NOTE — Progress Notes (Signed)
MEDICARE ANNUAL WELLNESS VISIT   Assessment:    Essential hypertension - continue medications, DASH diet, exercise and monitor at home. Call if greater than 130/80.   Chronic obstructive airway disease with asthma ([place]) Continue follow up pulmonary  OSA (obstructive sleep apnea) Follow up neuro  Obstructive bronchiectasis ([place]) Continue follow up pulmonary  History of DVT Likely due to chemo/cancer, will monitor for signs, stay on bASA  Mantle cell lymphoma of intra-abdominal lymph nodes ([place]) Continue follow up oncology  GERD Continue PPI/H2 blocker, diet discussed  Irritable bowel syndrome, unspecified type If not on benefiber then add it, decrease stress,  if any worsening symptoms, blood in stool, AB pain, etc call office  Diverticulosis of intestine without bleeding, unspecified intestinal tract location Increase fiber, if any worsening symptoms, blood in stool, AB pain, etc call office  Osteoporosis, unspecified osteoporosis type, unspecified pathological fracture presence Continue supplement  Osteoarthritis, unspecified osteoarthritis type, unspecified site monitor  Anxiety state continue medications, stress management techniques discussed, increase water, good sleep hygiene discussed, increase exercise, and increase veggies.   History of colonic polyps UTD  Mixed hyperlipidemia decrease fatty foods increase activity.   Vitamin D deficiency  Normocytic anemia -     monitor  Acquired hypogammaglobulinemia ([place]) Continue follow up oncology  Encounter for Medicare annual wellness exam 79 year  BMI 21.0-21.9, adult Monitor  Atherosclerosis of aorta (HCC) Control blood pressure, cholesterol, glucose, increase exercise.   Sinusitis, maxillary, chronic Follow up ENT  Essential tremor No worsening symptoms  OSA and COPD overlap syndrome ([place]) Continue follow up neuro  Over 30 minutes of counseling, chart review and critical decision making was  performed Future Appointments  Date Time Provider [place]  02/18/2020  1:30 PM [facility] SLE[facility] None  05/01/2020 12:45 PM Frann Rider, NP [facility] None  05/06/2020 11:30 AM Unk Pinto, MD [facility] None  07/30/2020 11:45 AM [facility] None  07/30/2020 12:15 PM [place], Rudell Cobb, MD [facility] None  11/10/2020 10:00 AM Unk Pinto, MD [facility] None    Plan:   During the course of the visit the patient was educated and counseled about appropriate screening and preventive services including:    Pneumococcal vaccine   Prevnar 13  Influenza vaccine  Td vaccine  Screening electrocardiogram  Bone densitometry screening  Colorectal cancer screening  Diabetes screening  Glaucoma screening  Nutrition counseling   Advanced directives: requested   Subjective:  Shannon Parsons is a 79 y.o. female who presents for Medicare Annual Wellness Visit and follow up.    She has a little constipation but states her stomach issues are better.  Her last colonoscopy was 2018, EGD 2015.  She is seeing Dr. Beacher May for possible OSA to rule that out as a possiblity for her insomnia/fatigue. She will have nocturia 1-2. She gets very tired between 3-5 oclock.    Her blood pressure has been controlled at home, today their BP is BP: 120/74 She does workout. She denies chest pain, shortness of breath, dizziness.   BMI is Body mass index is 19.84 kg/m., she is working on diet and exercise. Wt Readings from Last 10 Encounters:  02/04/20 112 lb (50.8 kg)  01/28/20 112 lb 12 oz (51.1 kg)  01/15/20 110 lb (49.9 kg)  12/05/19 112 lb 9.6 oz (51.1 kg)  10/29/19 115 lb 12.8 oz (52.5 kg)  08/01/19 118 lb (53.5 kg)  07/19/19 118 lb (53.5 kg)  06/08/19 119 lb 8 oz (54.2 kg)  06/07/19 118 lb (  53.5 kg)  04/24/19 116 lb 12.8 oz (53 kg)   She has a history of Mantle cell lymphoma, thromboembolic right leg due to cancer/chemo that is resolved, and bronchiectasis and  acquired hypogammaglobulinemia, she continues to follow with Dr. Marin Olp, and is doing very well. Will get her portacath out.   She is on CPAP, has chronic cough/sinusitis following with Dr. Melvyn Novas and Constance Holster and has obstructive bronchiectasis, she states it is getting better.   She is not on cholesterol medication and denies myalgias. Her cholesterol is not at goal. The cholesterol last visit was:   Lab Results  Component Value Date   CHOL 183 10/29/2019   HDL 68 10/29/2019   LDLCALC 96 10/29/2019   TRIG 98 10/29/2019   CHOLHDL 2.7 10/29/2019   Last A1C Lab Results  Component Value Date   HGBA1C 5.7 (H) 10/29/2019   Last GFR: Lab Results  Component Value Date   GFRNONAA >60 01/28/2020   Patient is on Vitamin D supplement.   Lab Results  Component Value Date   VD25OH 80 10/29/2019     She is on thyroid medication. Her medication was not changed last visit but she has cut back on 1/2 daily and 1 pill once a week and thinks she sleeps a little bit better.   Lab Results  Component Value Date   TSH 1.72 12/05/2019    Medication Review:  Current Outpatient Medications (Endocrine & Metabolic):  .  levothyroxine (SYNTHROID) 50 MCG tablet, Take 1 tablet daily on an empty stomach with only water for 30 minutes & no Antacid meds, Calcium or Magnesium for 4 hours & avoid Biotin   Current Outpatient Medications (Cardiovascular):  .  rosuvastatin (CRESTOR) 10 MG tablet, 1/4 tablet every day   Current Outpatient Medications (Respiratory):  .  PROAIR HFA 108 (90 Base) MCG/ACT inhaler, INHALE 2 PUFFS EVERY 4 HOURS AS NEEDED INTO THE LUNGS FOR WHEEZING OR SHORTNESS OF BREATH.   Facility-Administered Medications Ordered in Other Visits (Respiratory):  .  ipratropium-albuterol (DUONEB) 0.5-2.5 (3) MG/3ML nebulizer solution 3 mL  Current Outpatient Medications (Analgesics):  .  acetaminophen (TYLENOL) 325 MG tablet, Take 325 mg by mouth every 6 (six) hours as needed for moderate pain  (For pain.).      Current Outpatient Medications (Other):  [name]  Cholecalciferol (VITAMIN D3) 50 MCG (2000 UT) capsule, Take 4,000 Units by mouth.  .  hyoscyamine (LEVSIN SL) 0.125 MG SL tablet, TAKE 1 TO 2 TABLETS BY MOUTH EVERY 4 HOURS AS NEEDED FOR NAUSEA, CRAMPING, BLOATING OR DIARRHEA .  lidocaine-prilocaine (EMLA) cream, APPLY TO PAC 1 HOUR BEFORE ACCESS. [name]  LORazepam (ATIVAN) 0.5 MG tablet, Take 1 tablet (0.5 mg total) by mouth every 8 (eight) hours as needed for sleep (Nausea or vomiting). .  polyethylene glycol (MIRALAX / GLYCOLAX) 17 g packet, Take 17 g by mouth daily. [name]  Respiratory Therapy Supplies (FLUTTER) DEVI, Take as directed .  amoxicillin (AMOXIL) 500 MG capsule, TAKE 4 CAPSULES BY MOUTH ONE HOUR BEFORE DENTAL PROCEDURE. (Patient not taking: Reported on 02/04/2020)  No current facility-administered medications for this visit.   Allergies  Allergen Reactions  . Alendronate Other (See Comments)    "made me choke"    . Risedronate Sodium Other (See Comments)    "made me choke"    . Sulfa Antibiotics Other (See Comments)    Doesn't remember   . Sulfasalazine Other (See Comments)    Doesn't remember   . Other Rash  . Tetracyclines &  Related Rash    Current Problems (verified) Patient Active Problem List   Diagnosis Date Noted  . Hypothyroidism 10/04/2018  . Abnormal glucose 10/04/2018  . Labile hypertension 10/04/2018  . Cervical dystonia 01/16/2018  . Sinusitis, maxillary, chronic 10/19/2017  . Atherosclerosis of aorta ([place]) 08/09/2017  . Tremor 05/06/2016  . Acquired hypogammaglobulinemia ([place]) 04/05/2016  . Obstructive bronchiectasis ([place]) 03/18/2016  . Body mass index (BMI) of 20.0-20.9 in adult 07/21/2015  . History of DVT (deep vein thrombosis)   . Normocytic anemia 01/04/2015  . Mantle cell lymphoma ([place]) 10/18/2014  . Medication management 07/15/2014  . OSA and COPD overlap syndrome ([place]) 07/05/2014  . Anxiety state 02/27/2014  . Hyperlipidemia,  mixed   . Hypotension   . GERD   . Osteoporosis  (Refuses treatment)   . Osteoarthritis   . Vitamin D deficiency   . IBS   . Diverticulosis   . History of colonic polyps 07/19/2011  . Chronic obstructive airway disease with asthma ([place]) 03/16/2011    Screening Tests Health Maintenance  Topic Date Due  . TETANUS/TDAP  Never done  . COLONOSCOPY  02/02/2022  . DEXA SCAN  Completed  . COVID-19 Vaccine  Completed  . PNA vac Low Risk Adult  Completed  . INFLUENZA VACCINE  Discontinued   Last colonoscopy: 2018 due 5 years EGD 2015 Last mammogram: 03/2019 Last pap smear/pelvic exam: 2010   DEXA:07/2016 Bone marrow exam 2016 Ct sinus 10/2017 MRI brain w/wo 2019 Echo 02/2015 Holter 12/2017 Stress test 2015 Sleep study 11/30/2016  Prior vaccinations: TD or Tdap: declined  Influenza: declined Pneumococcal: 2014 Prevnar13: 2017 Shingles/Zostavax: 2014 shingrix discussed  Names of Other Physician/Practitioners you currently use: 1. [facility] here for primary care 2. [facility], eye doctor, last visit 2020 3. [facility], 2021 Patient Care Team: Unk Pinto, MD as PCP - General (Internal Medicine) Ladene Artist, MD as Consulting Physician (Gastroenterology) Satira Sark, MD as Consulting Physician (Cardiology) Clance, Armando Reichert, MD as Consulting Physician (Pulmonary Disease) Volanda Napoleon, MD as Consulting Physician (Oncology)  SURGICAL HISTORY She  has a past surgical history that includes Appendectomy; Tonsillectomy; Bunionectomy; Knee arthroscopy; Umbilical hernia repair; Video bronchoscopy (Bilateral, 10/12/2017); and Cataract extraction, bilateral. FAMILY HISTORY Her family history includes Emphysema in her father; Heart attack (age of onset: 69) in her father; Heart disease in her father; Heart disease (age of onset: 18) in her brother; Hypertension in her mother; Rheum arthritis in her sister; Thyroid disease in her  mother. SOCIAL HISTORY She  reports that she has never smoked. She has never used smokeless tobacco. She reports that she does not drink alcohol or use drugs.   MEDICARE WELLNESS OBJECTIVES: Physical activity:   Cardiac risk factors:   Depression/mood screen:   Depression screen [facility] 2/9 10/28/2019  Decreased Interest 0  Down, Depressed, Hopeless 0  PHQ - 2 Score 0  Some recent data might be hidden    ADLs:  In your present state of health, do you have any difficulty performing the following activities: 10/28/2019 04/16/2019  Hearing? N N  Vision? N N  Difficulty concentrating or making decisions? N N  Walking or climbing stairs? N N  Dressing or bathing? N N  Doing errands, shopping? N N  Some recent data might be hidden     Cognitive Testing  Alert? Yes  Normal Appearance?Yes  Oriented to person? Yes  Place? Yes   Time? Yes  Recall of three objects?  Yes  Can perform simple calculations? Yes  Displays appropriate judgment?Yes  Can read the correct time from a watch face?Yes  EOL planning: Does Patient Have a Medical Advance Directive?: Yes Type of Advance Directive: Healthcare Power of Attorney, Living will  Review of Systems  Constitutional: Positive for malaise/fatigue. Negative for chills and fever.  HENT: Negative for congestion, sore throat and tinnitus.   Eyes: Negative.   Respiratory: Negative for cough, shortness of breath and wheezing.   Cardiovascular: Negative for chest pain, palpitations and leg swelling.  Gastrointestinal: Negative for blood in stool and melena.  Genitourinary: Negative for hematuria.  Musculoskeletal: Positive for myalgias.  Skin: Negative.   Neurological: Negative for dizziness, sensory change, loss of consciousness and headaches.  Psychiatric/Behavioral: Negative for depression. The patient is not nervous/anxious and does not have insomnia.      Objective:     Today's Vitals   02/04/20 1502  BP: 120/74  Pulse: 73  Temp: (!) 97.3  F (36.3 C)  SpO2: 97%  Weight: 112 lb (50.8 kg)  Height: 5\' 3"  (1.6 m)  PainSc: 0-No pain   Body mass index is 19.84 kg/m.  General Appearance: Frail appearing elder, in no apparent distress. Eyes: PERRLA, EOMs, conjunctiva no swelling or erythema Sinuses: No Frontal/maxillary tenderness ENT/Mouth: Ext aud canals clear, TMs without erythema, bulging. No erythema, swelling, or exudate on post pharynx.  Tonsils not swollen or erythematous. Hearing normal.  Neck: Supple, thyroid normal.  Respiratory: Respiratory effort normal, BS equal bilaterally without rales, rhonchi, wheezing or stridor.  Cardio: RRR with no MRGs. Portacath on left chest Abdomen: Soft, + BS.  + epigastric tenderness, no guarding, rebound, hernias, masses. Lymphatics: Non tender without lymphadenopathy.  Musculoskeletal: Full ROM, 5/5 strength, Normal gait Skin: Warm, dry without rashes, lesions, ecchymosis.  Neuro: Cranial nerves intact. No cerebellar symptoms.  Psych: Awake and oriented X 3, normal affect, Insight and Judgment appropriate.  Medicare Attestation I have personally reviewed: The patient's medical and social history Their use of alcohol, tobacco or illicit drugs Their current medications and supplements The patient's functional ability including ADLs,fall risks, home safety risks, cognitive, and hearing and visual impairment Diet and physical activities Evidence for depression or mood disorders  The patient's weight, height, BMI, and visual acuity have been recorded in the chart.  I have made referrals, counseling, and provided education to the patient based on review of the above and I have provided the patient with a written personalized care plan for preventive services.     Vicie Mutters, PA-C   02/04/2020

## 2020-02-04 ENCOUNTER — Other Ambulatory Visit: Payer: Self-pay

## 2020-02-04 ENCOUNTER — Encounter: Payer: Self-pay | Admitting: Physician Assistant

## 2020-02-04 ENCOUNTER — Ambulatory Visit (INDEPENDENT_AMBULATORY_CARE_PROVIDER_SITE_OTHER): Payer: PPO | Admitting: Physician Assistant

## 2020-02-04 VITALS — BP 120/74 | HR 73 | Temp 97.3°F | Ht 63.0 in | Wt 112.0 lb

## 2020-02-04 DIAGNOSIS — G4733 Obstructive sleep apnea (adult) (pediatric): Secondary | ICD-10-CM

## 2020-02-04 DIAGNOSIS — Z0001 Encounter for general adult medical examination with abnormal findings: Secondary | ICD-10-CM

## 2020-02-04 DIAGNOSIS — R6889 Other general symptoms and signs: Secondary | ICD-10-CM

## 2020-02-04 DIAGNOSIS — K589 Irritable bowel syndrome without diarrhea: Secondary | ICD-10-CM | POA: Diagnosis not present

## 2020-02-04 DIAGNOSIS — M810 Age-related osteoporosis without current pathological fracture: Secondary | ICD-10-CM

## 2020-02-04 DIAGNOSIS — J449 Chronic obstructive pulmonary disease, unspecified: Secondary | ICD-10-CM | POA: Diagnosis not present

## 2020-02-04 DIAGNOSIS — Z0000 Encounter for general adult medical examination without abnormal findings: Secondary | ICD-10-CM

## 2020-02-04 DIAGNOSIS — Z79899 Other long term (current) drug therapy: Secondary | ICD-10-CM | POA: Diagnosis not present

## 2020-02-04 DIAGNOSIS — E559 Vitamin D deficiency, unspecified: Secondary | ICD-10-CM

## 2020-02-04 DIAGNOSIS — C8312 Mantle cell lymphoma, intrathoracic lymph nodes: Secondary | ICD-10-CM | POA: Diagnosis not present

## 2020-02-04 DIAGNOSIS — R7309 Other abnormal glucose: Secondary | ICD-10-CM | POA: Diagnosis not present

## 2020-02-04 DIAGNOSIS — I700 Atherosclerosis of aorta: Secondary | ICD-10-CM

## 2020-02-04 DIAGNOSIS — J479 Bronchiectasis, uncomplicated: Secondary | ICD-10-CM | POA: Diagnosis not present

## 2020-02-04 DIAGNOSIS — E782 Mixed hyperlipidemia: Secondary | ICD-10-CM

## 2020-02-04 DIAGNOSIS — I959 Hypotension, unspecified: Secondary | ICD-10-CM

## 2020-02-04 DIAGNOSIS — R251 Tremor, unspecified: Secondary | ICD-10-CM

## 2020-02-04 DIAGNOSIS — F411 Generalized anxiety disorder: Secondary | ICD-10-CM

## 2020-02-04 DIAGNOSIS — E039 Hypothyroidism, unspecified: Secondary | ICD-10-CM

## 2020-02-04 DIAGNOSIS — D649 Anemia, unspecified: Secondary | ICD-10-CM

## 2020-02-04 DIAGNOSIS — M1990 Unspecified osteoarthritis, unspecified site: Secondary | ICD-10-CM

## 2020-02-04 DIAGNOSIS — Z86718 Personal history of other venous thrombosis and embolism: Secondary | ICD-10-CM

## 2020-02-04 DIAGNOSIS — D801 Nonfamilial hypogammaglobulinemia: Secondary | ICD-10-CM | POA: Diagnosis not present

## 2020-02-04 DIAGNOSIS — R0989 Other specified symptoms and signs involving the circulatory and respiratory systems: Secondary | ICD-10-CM | POA: Diagnosis not present

## 2020-02-04 NOTE — Patient Instructions (Addendum)
It is okay for you to take the 1/2 of the thyroid medication every day except 1 pill on Sunday. We will check your thyroid this visit and I will let you know.   If you want, you can try the [facility] name thyroid samples that I gave you, take them the same way, 1/2 pill daily except 1 on Sunday to see if this helps with your sleeping/symptoms- it is rare but sometimes this can help.   Use a dropper or use a cap to put peroxide, olive oil,mineral oil or canola oil in the effected ear- 2-3 times a week. Let it soak for 20-30 min then you can take a shower or use a baby bulb with warm water to wash out the ear wax.  Can buy debrox wax removal kit over the counter.  Do not use Qtips VARICOSE VEINS Varicose veins are veins that have become enlarged and twisted. CAUSES This condition is the result of valves in the veins not working properly. Valves in the veins help return blood from the leg to the heart. When your calf muscles squeeze, the blood moves up your leg then the valves close and this continues until the blood gets back to your heart.  If these valves are damaged, blood flows backwards and backs up into the veins in the leg near the skin OR if your are sitting/standing for a long time without using your calf muscles the blood will back up into the veins in your legs. This causes the veins to become larger. People who are on their feet a lot, sit a lot without walking (like on a plane, at a desk, or in a car), who are pregnant, or who are overweight are more likely to develop varicose veins. SYMPTOMS   Bulging, twisted-appearing, bluish veins, most commonly found on the legs.  Leg pain or a feeling of heaviness. These symptoms may be worse at the end of the day.  Leg swelling.  Skin color changes. DIAGNOSIS  Varicose veins can usually be diagnosed with an exam of your legs by your caregiver. He or she may recommend an ultrasound of your leg veins. TREATMENT  Most varicose veins can be treated  at home. However, other treatments are available for people who have persistent symptoms or who want to treat the cosmetic appearance of the varicose veins. But this is only cosmetic and they will return if not properly treated. These include:  Laser treatment of very small varicose veins.  Medicine that is shot (injected) into the vein. This medicine hardens the walls of the vein and closes off the vein. This treatment is called sclerotherapy. Afterwards, you may need to wear clothing or bandages that apply pressure.  Surgery. HOME CARE INSTRUCTIONS   Do not stand or sit in one position for long periods of time. Do not sit with your legs crossed. Rest with your legs raised during the day.  Your legs have to be higher than your heart so that gravity will force the valves to open, so please really elevate your legs.   Wear elastic stockings or support hose. Do not wear other tight, encircling garments around the legs, pelvis, or waist.  [employer]  has a wide variety of well priced compression stockings. [place], [place] [id number] [phone]  OR THERE ARE COPPER INFUSED COMPRESSION SOCKS AT [facility] OR [facility]  [employer] also has great cheap/afforable stockings or socks- the socks are easier to get on your feet  -  can also get a jacob's donner that helps you put on the sock  Walk as much as possible to increase blood flow.  Raise the foot of your bed at night with 2-inch blocks.  If you get a cut in the skin over the vein and the vein bleeds, lie down with your leg raised and press on it with a clean cloth until the bleeding stops. Then place a bandage (dressing) on the cut. See your caregiver if it continues to bleed or needs stitches. SEEK MEDICAL CARE IF:   The skin around your ankle starts to break down.  You have pain, redness, tenderness, or hard swelling developing in your leg over a vein.  You are uncomfortable due to leg pain. Document Released: 06/09/2005  Document Revised: 11/22/2011 Document Reviewed: 10/26/2010 [facility] Patient Information 2014 [place].    Ask insurance and pharmacy about shingrix - it is a 2 part shot that we will not be getting in the office.   Suggest getting AFTER covid vaccines, have to wait at least a month This shot can make you feel bad due to such good immune response it can trigger some inflammation so take tylenol or aleve day of or day after and plan on resting.   Can go to [email or web address] for more information  Shingrix Vaccination  Two vaccines are licensed and recommended to prevent shingles in the [place] Zoster vaccine live (ZVL, Zostavax) has been in use since 2006. Recombinant zoster vaccine (RZV, Shingrix), has been in use since 2017 and is recommended by ACIP as the preferred shingles vaccine.  What Everyone Should Know about Shingles Vaccine (Shingrix) One of the Recommended Vaccines by Disease Shingles vaccination is the only way to protect against shingles and postherpetic neuralgia (PHN), the most common complication from shingles. CDC recommends that healthy adults 50 years and older get two doses of the shingles vaccine called Shingrix (recombinant zoster vaccine), separated by 2 to 6 months, to prevent shingles and the complications from the disease. Your doctor or pharmacist can give you Shingrix as a shot in your upper arm. Shingrix provides strong protection against shingles and PHN. Two doses of Shingrix is more than 90% effective at preventing shingles and PHN. Protection stays above 85% for at least the first four years after you get vaccinated. Shingrix is the preferred vaccine, over Zostavax (zoster vaccine live), a shingles vaccine in use since 2006. Zostavax may still be used to prevent shingles in healthy adults 60 years and older. For example, you could use Zostavax if a person is allergic to Shingrix, prefers Zostavax, or requests  immediate vaccination and Shingrix is unavailable. Who Should Get Shingrix? Healthy adults 50 years and older should get two doses of Shingrix, separated by 2 to 6 months. You should get Shingrix even if in the past you . had shingles  . received Zostavax  . are not sure if you had chickenpox There is no maximum age for getting Shingrix. If you had shingles in the past, you can get Shingrix to help prevent future occurrences of the disease. There is no specific length of time that you need to wait after having shingles before you can receive Shingrix, but generally you should make sure the shingles rash has gone away before getting vaccinated. You can get Shingrix whether or not you remember having had chickenpox in the past. Studies show that more than 99% of Americans 40 years and older have had chickenpox, even if they don't remember having the disease. Chickenpox  and shingles are related because they are caused by the same virus (varicella zoster virus). After a person recovers from chickenpox, the virus stays dormant (inactive) in the body. It can reactivate years later and cause shingles. If you had Zostavax in the recent past, you should wait at least eight weeks before getting Shingrix. Talk to your healthcare provider to determine the best time to get Shingrix. Shingrix is available in [employer] and pharmacies. To find doctor's offices or pharmacies near you that offer the vaccine, visit HealthMap Vaccine FinderExternal. If you have questions about Shingrix, talk with your healthcare provider. Vaccine for Those 22 Years and Older  Shingrix reduces the risk of shingles and PHN by more than 90% in people 53 and older. CDC recommends the vaccine for healthy adults 26 and older.  Who Should Not Get Shingrix? You should not get Shingrix if you: . have ever had a severe allergic reaction to any component of the vaccine or after a dose of Shingrix  . tested negative for immunity to  varicella zoster virus. If you test negative, you should get chickenpox vaccine.  . currently have shingles  . currently are pregnant or breastfeeding. Women who are pregnant or breastfeeding should wait to get Shingrix.  [name] receive specific antiviral drugs (acyclovir, famciclovir, or valacyclovir) 24 hours before vaccination (avoid use of these antiviral drugs for 14 days after vaccination)- zoster vaccine live only If you have a minor acute (starts suddenly) illness, such as a cold, you may get Shingrix. But if you have a moderate or severe acute illness, you should usually wait until you recover before getting the vaccine. This includes anyone with a temperature of 101.78F or higher. The side effects of the Shingrix are temporary, and usually last 2 to 3 days. While you may experience pain for a few days after getting Shingrix, the pain will be less severe than having shingles and the complications from the disease. How Well Does Shingrix Work? Two doses of Shingrix provides strong protection against shingles and postherpetic neuralgia (PHN), the most common complication of shingles. . In adults 61 to 77 years old who got two doses, Shingrix was 97% effective in preventing shingles; among adults 70 years and older, Shingrix was 91% effective.  . In adults 70 to 77 years old who got two doses, Shingrix was 91% effective in preventing PHN; among adults 70 years and older, Shingrix was 89% effective. Shingrix protection remained high (more than 85%) in people 70 years and older throughout the four years following vaccination. Since your risk of shingles and PHN increases as you get older, it is important to have strong protection against shingles in your older years. Top of Page  What Are the Possible Side Effects of Shingrix? Studies show that Shingrix is safe. The vaccine helps your body create a strong defense against shingles. As a result, you are likely to have temporary side effects from getting  the shots. The side effects may affect your ability to do normal daily activities for 2 to 3 days. Most people got a sore arm with mild or moderate pain after getting Shingrix, and some also had redness and swelling where they got the shot. Some people felt tired, had muscle pain, a headache, shivering, fever, stomach pain, or nausea. About 1 out of 6 people who got Shingrix experienced side effects that prevented them from doing regular activities. Symptoms went away on their own in about 2 to 3 days. Side effects were more common in  younger people. You might have a reaction to the first or second dose of Shingrix, or both doses. If you experience side effects, you may choose to take over-the-counter pain medicine such as ibuprofen or acetaminophen. If you experience side effects from Shingrix, you should report them to the Vaccine Adverse Event Reporting System (VAERS). Your doctor might file this report, or you can do it yourself through the VAERS websiteExternal, or by calling [phone]. If you have any questions about side effects from Shingrix, talk with your doctor. The shingles vaccine does not contain thimerosal (a preservative containing mercury). Top of Page  When Should I See a Doctor Because of the Side Effects I Experience From Shingrix? In clinical trials, Shingrix was not associated with serious adverse events. In fact, serious side effects from vaccines are extremely rare. For example, for every 1 million doses of a vaccine given, only one or two people may have a severe allergic reaction. Signs of an allergic reaction happen within minutes or hours after vaccination and include hives, swelling of the face and throat, difficulty breathing, a fast heartbeat, dizziness, or weakness. If you experience these or any other life-threatening symptoms, see a doctor right away. Shingrix causes a strong response in your immune system, so it may produce short-term side effects more intense than  you are used to from other vaccines. These side effects can be uncomfortable, but they are expected and usually go away on their own in 2 or 3 days. Top of Page  How Can I Pay For Shingrix? There are several ways shingles vaccine may be paid for: Medicare . Medicare Part D plans cover the shingles vaccine, but there may be a cost to you depending on your plan. There may be a copay for the vaccine, or you may need to pay in full then get reimbursed for a certain amount.  . Medicare Part B does not cover the shingles vaccine. Medicaid . Medicaid may or may not cover the vaccine. Contact your insurer to find out. Private health insurance . Many private health insurance plans will cover the vaccine, but there may be a cost to you depending on your plan. Contact your insurer to find out. Vaccine assistance programs . Some pharmaceutical companies provide vaccines to eligible adults who cannot afford them. You may want to check with the vaccine manufacturer, [employer], about Shingrix. If you do not currently have health insurance, learn more about affordable health coverage optionsExternal. To find doctor's offices or pharmacies near you that offer the vaccine, visit HealthMap Vaccine FinderExternal.

## 2020-02-05 LAB — COMPLETE METABOLIC PANEL WITH GFR
AG Ratio: 2.8 (calc) — ABNORMAL HIGH (ref 1.0–2.5)
ALT: 15 U/L (ref 6–29)
AST: 27 U/L (ref 10–35)
Albumin: 4.5 g/dL (ref 3.6–5.1)
Alkaline phosphatase (APISO): 56 U/L (ref 37–153)
BUN/Creatinine Ratio: 18 (calc) (ref 6–22)
BUN: 18 mg/dL (ref 7–25)
CO2: 31 mmol/L (ref 20–32)
Calcium: 9.7 mg/dL (ref 8.6–10.4)
Chloride: 102 mmol/L (ref 98–110)
Creat: 0.98 mg/dL — ABNORMAL HIGH (ref 0.60–0.93)
GFR, Est African American: 65 mL/min/{1.73_m2} (ref >=60)
GFR, Est Non African American: 56 mL/min/{1.73_m2} — ABNORMAL LOW (ref >=60)
Globulin: 1.6 g/dL (calc) — ABNORMAL LOW (ref 1.9–3.7)
Glucose, Bld: 90 mg/dL (ref 65–99)
Potassium: 4.7 mmol/L (ref 3.5–5.3)
Sodium: 139 mmol/L (ref 135–146)
Total Bilirubin: 0.4 mg/dL (ref 0.2–1.2)
Total Protein: 6.1 g/dL (ref 6.1–8.1)

## 2020-02-05 LAB — VITAMIN D 25 HYDROXY (VIT D DEFICIENCY, FRACTURES): Vit D, 25-Hydroxy: 58 ng/mL (ref 30–100)

## 2020-02-05 LAB — LIPID PANEL
Cholesterol: 189 mg/dL (ref <200)
HDL: 73 mg/dL (ref >=50)
LDL Cholesterol (Calc): 96 mg/dL (calc)
Non-HDL Cholesterol (Calc): 116 mg/dL (calc) (ref <130)
Total CHOL/HDL Ratio: 2.6 (calc) (ref <5.0)
Triglycerides: 104 mg/dL (ref <150)

## 2020-02-05 LAB — CBC WITH DIFFERENTIAL/PLATELET
Absolute Monocytes: 638 cells/uL (ref 200–950)
Basophils Absolute: 68 cells/uL (ref 0–200)
Basophils Relative: 1.2 %
Eosinophils Absolute: 182 cells/uL (ref 15–500)
Eosinophils Relative: 3.2 %
HCT: 42.9 % (ref 35.0–45.0)
Hemoglobin: 13.5 g/dL (ref 11.7–15.5)
Lymphs Abs: 1727 cells/uL (ref 850–3900)
MCH: 28.2 pg (ref 27.0–33.0)
MCHC: 31.5 g/dL — ABNORMAL LOW (ref 32.0–36.0)
MCV: 89.7 fL (ref 80.0–100.0)
MPV: 9.9 fL (ref 7.5–12.5)
Monocytes Relative: 11.2 %
Neutro Abs: 3084 cells/uL (ref 1500–7800)
Neutrophils Relative %: 54.1 %
Platelets: 223 10*3/uL (ref 140–400)
RBC: 4.78 10*6/uL (ref 3.80–5.10)
RDW: 13.3 % (ref 11.0–15.0)
Total Lymphocyte: 30.3 %
WBC: 5.7 10*3/uL (ref 3.8–10.8)

## 2020-02-05 LAB — HEMOGLOBIN A1C
Hgb A1c MFr Bld: 5.4 % of total Hgb (ref <5.7)
Mean Plasma Glucose: 108 (calc)
eAG (mmol/L): 6 (calc)

## 2020-02-05 LAB — MAGNESIUM: Magnesium: 2.2 mg/dL (ref 1.5–2.5)

## 2020-02-05 LAB — TSH: TSH: 2.34 mIU/L (ref 0.40–4.50)

## 2020-02-06 ENCOUNTER — Telehealth: Payer: Self-pay | Admitting: Neurology

## 2020-02-06 NOTE — Telephone Encounter (Signed)
Pt called to verify what he apt is for in august advised apt note for 3 month fu and pt requested a CB for additional information for upcoming home sleep study

## 2020-02-06 NOTE — Telephone Encounter (Signed)
I called the pt. She wanted to know after her 02/18/2020 hst how long it would possibly take the physician to read the study. I advised typical turn around time for results are 10-14 business days. Pt verbalized understanding and had no further questions at this time.

## 2020-02-13 ENCOUNTER — Telehealth (HOSPITAL_COMMUNITY): Payer: Self-pay

## 2020-02-13 NOTE — Telephone Encounter (Signed)
Called to schedule port removal, no answer, vm not set up. AW

## 2020-02-18 ENCOUNTER — Ambulatory Visit (INDEPENDENT_AMBULATORY_CARE_PROVIDER_SITE_OTHER): Payer: PPO | Admitting: Neurology

## 2020-02-18 DIAGNOSIS — J984 Other disorders of lung: Secondary | ICD-10-CM

## 2020-02-18 DIAGNOSIS — T451X5A Adverse effect of antineoplastic and immunosuppressive drugs, initial encounter: Secondary | ICD-10-CM

## 2020-02-18 DIAGNOSIS — J4542 Moderate persistent asthma with status asthmaticus: Secondary | ICD-10-CM

## 2020-02-18 DIAGNOSIS — G4733 Obstructive sleep apnea (adult) (pediatric): Secondary | ICD-10-CM | POA: Diagnosis not present

## 2020-02-18 DIAGNOSIS — J470 Bronchiectasis with acute lower respiratory infection: Secondary | ICD-10-CM

## 2020-02-26 ENCOUNTER — Telehealth: Payer: Self-pay | Admitting: Hematology & Oncology

## 2020-02-26 NOTE — Telephone Encounter (Signed)
Returned call from VM.  Patient was asking as to who would be the physician that will be removing her port on 6/22.  Per 6/15 staff message it will be Dr Pascal Lux. Patient was advised of this as well.

## 2020-03-03 ENCOUNTER — Other Ambulatory Visit: Payer: Self-pay | Admitting: Radiology

## 2020-03-04 ENCOUNTER — Encounter (HOSPITAL_COMMUNITY): Payer: Self-pay

## 2020-03-04 ENCOUNTER — Other Ambulatory Visit: Payer: Self-pay

## 2020-03-04 ENCOUNTER — Ambulatory Visit (HOSPITAL_COMMUNITY)
Admission: RE | Admit: 2020-03-04 | Discharge: 2020-03-04 | Disposition: A | Discharge status: Home / Self Care | Payer: PPO | Source: Ambulatory Visit | Attending: Hematology & Oncology | Admitting: Hematology & Oncology

## 2020-03-04 DIAGNOSIS — C8590 Non-Hodgkin lymphoma, unspecified, unspecified site: Secondary | ICD-10-CM | POA: Diagnosis not present

## 2020-03-04 DIAGNOSIS — Z8249 Family history of ischemic heart disease and other diseases of the circulatory system: Secondary | ICD-10-CM | POA: Insufficient documentation

## 2020-03-04 DIAGNOSIS — Z79899 Other long term (current) drug therapy: Secondary | ICD-10-CM | POA: Diagnosis not present

## 2020-03-04 DIAGNOSIS — R7303 Prediabetes: Secondary | ICD-10-CM | POA: Diagnosis not present

## 2020-03-04 DIAGNOSIS — I10 Essential (primary) hypertension: Secondary | ICD-10-CM | POA: Diagnosis not present

## 2020-03-04 DIAGNOSIS — Z452 Encounter for adjustment and management of vascular access device: Secondary | ICD-10-CM | POA: Insufficient documentation

## 2020-03-04 DIAGNOSIS — Z825 Family history of asthma and other chronic lower respiratory diseases: Secondary | ICD-10-CM | POA: Insufficient documentation

## 2020-03-04 DIAGNOSIS — Z8261 Family history of arthritis: Secondary | ICD-10-CM | POA: Insufficient documentation

## 2020-03-04 DIAGNOSIS — K219 Gastro-esophageal reflux disease without esophagitis: Secondary | ICD-10-CM | POA: Insufficient documentation

## 2020-03-04 DIAGNOSIS — Z888 Allergy status to other drugs, medicaments and biological substances status: Secondary | ICD-10-CM | POA: Insufficient documentation

## 2020-03-04 DIAGNOSIS — Z8349 Family history of other endocrine, nutritional and metabolic diseases: Secondary | ICD-10-CM | POA: Diagnosis not present

## 2020-03-04 DIAGNOSIS — J45909 Unspecified asthma, uncomplicated: Secondary | ICD-10-CM | POA: Insufficient documentation

## 2020-03-04 DIAGNOSIS — Z881 Allergy status to other antibiotic agents status: Secondary | ICD-10-CM | POA: Insufficient documentation

## 2020-03-04 DIAGNOSIS — M810 Age-related osteoporosis without current pathological fracture: Secondary | ICD-10-CM | POA: Insufficient documentation

## 2020-03-04 DIAGNOSIS — G4733 Obstructive sleep apnea (adult) (pediatric): Secondary | ICD-10-CM | POA: Diagnosis not present

## 2020-03-04 DIAGNOSIS — Z79890 Hormone replacement therapy (postmenopausal): Secondary | ICD-10-CM | POA: Insufficient documentation

## 2020-03-04 DIAGNOSIS — M1990 Unspecified osteoarthritis, unspecified site: Secondary | ICD-10-CM | POA: Insufficient documentation

## 2020-03-04 DIAGNOSIS — Z882 Allergy status to sulfonamides status: Secondary | ICD-10-CM | POA: Diagnosis not present

## 2020-03-04 DIAGNOSIS — E785 Hyperlipidemia, unspecified: Secondary | ICD-10-CM | POA: Insufficient documentation

## 2020-03-04 DIAGNOSIS — K589 Irritable bowel syndrome without diarrhea: Secondary | ICD-10-CM | POA: Insufficient documentation

## 2020-03-04 DIAGNOSIS — C8313 Mantle cell lymphoma, intra-abdominal lymph nodes: Secondary | ICD-10-CM | POA: Diagnosis not present

## 2020-03-04 HISTORY — PX: IR REMOVAL TUN ACCESS W/ PORT W/O FL MOD SED: IMG2290

## 2020-03-04 MED ORDER — MIDAZOLAM HCL 2 MG/2ML IJ SOLN
INTRAMUSCULAR | Status: AC
  Filled 2020-03-04: qty 2

## 2020-03-04 MED ORDER — LIDOCAINE-EPINEPHRINE 1 %-1:100000 IJ SOLN
INTRAMUSCULAR | Status: AC
  Filled 2020-03-04: qty 1

## 2020-03-04 MED ORDER — CEFAZOLIN SODIUM-DEXTROSE 2-4 GM/100ML-% IV SOLN
2.0000 g | INTRAVENOUS | Status: AC
  Administered 2020-03-04: 2 g via INTRAVENOUS

## 2020-03-04 MED ORDER — CEFAZOLIN SODIUM-DEXTROSE 2-4 GM/100ML-% IV SOLN
INTRAVENOUS | Status: AC
  Filled 2020-03-04: qty 100

## 2020-03-04 MED ORDER — FENTANYL CITRATE (PF) 100 MCG/2ML IJ SOLN
INTRAMUSCULAR | Status: AC | PRN
  Administered 2020-03-04: 50 ug via INTRAVENOUS

## 2020-03-04 MED ORDER — FENTANYL CITRATE (PF) 100 MCG/2ML IJ SOLN
INTRAMUSCULAR | Status: AC
  Filled 2020-03-04: qty 2

## 2020-03-04 MED ORDER — MIDAZOLAM HCL 2 MG/2ML IJ SOLN
INTRAMUSCULAR | Status: AC | PRN
  Administered 2020-03-04: 1 mg via INTRAVENOUS

## 2020-03-04 NOTE — H&P (Signed)
Chief Complaint: Patient was seen in consultation today for [facility] a cath removal at the request of Ennever,Peter R  Referring Physician(s): Ennever,Peter R  Supervising Physician: Daryll Brod  Patient Status: [facility] - Out-pt  History of Present Illness: Shannon Parsons is a 79 y.o. female   Mantle cell [place] with Dr Hardin Negus in remission (no treatments 5 yrs now) and MD recommends removal of [facility] Port a cath placed in IR 10/14/2014- Dr Kathlene Cote  Past Medical History:  Diagnosis Date  . Adenomatous colon polyp 1994  . Asthma   . Cataract 2013   bilateral eyes  . Diverticulosis   . Family history of ischemic heart disease   . Fibrocystic breast disease   . GERD (gastroesophageal reflux disease)   . Hiatal hernia   . Hyperlipidemia   . Hypertension    pt denies - 01/16/18  . IBS (irritable bowel syndrome)   . Internal hemorrhoids   . Mantle cell lymphoma ([place]) 10/18/2014  . Melanoma ([place])    Facial, pt denies on 01/16/18  . OSA (obstructive sleep apnea)   . Osteoarthritis   . Osteoporosis   . Prediabetes   . Unspecified hypothyroidism   . Vitamin D deficiency     Past Surgical History:  Procedure Laterality Date  . APPENDECTOMY    . BUNIONECTOMY    . CATARACT EXTRACTION, BILATERAL    . KNEE ARTHROSCOPY    . TONSILLECTOMY    . UMBILICAL HERNIA REPAIR    . VIDEO BRONCHOSCOPY Bilateral 10/12/2017   Procedure: VIDEO BRONCHOSCOPY WITHOUT FLUORO;  Surgeon: Tanda Rockers, MD;  Location: [facility] ENDOSCOPY;  Service: Endoscopy;  Laterality: Bilateral;    Allergies: Alendronate, Risedronate sodium, Sulfa antibiotics, Sulfasalazine, Other, and Tetracyclines & related  Medications: Prior to Admission medications   Medication Sig Start Date End Date Taking? Authorizing Provider  acidophilus (RISAQUAD) CAPS capsule Take 1 capsule by mouth 3 (three) times a week.   Yes [provider]  Cholecalciferol (VITAMIN D3) 50 MCG (2000 UT) capsule Take 2,000  Units by mouth daily.    Yes [provider]  levothyroxine (SYNTHROID) 50 MCG tablet Take 1 tablet daily on an empty stomach with only water for 30 minutes & no Antacid meds, Calcium or Magnesium for 4 hours & avoid Biotin Patient taking differently: Take 25-50 mcg by mouth See admin instructions. Take 25 mcg by mouth Monday - Saturday before breakfast and take 50 mcg before breakfast on Sunday 09/12/19  Yes Unk Pinto, MD  rosuvastatin (CRESTOR) 10 MG tablet 1/4 tablet every day Patient taking differently: Take 2.5 mg by mouth daily.  05/31/19  Yes Vicie Mutters, PA-C  amoxicillin (AMOXIL) 500 MG capsule TAKE 4 CAPSULES BY MOUTH ONE HOUR BEFORE DENTAL PROCEDURE. Patient taking differently: Take 2,000 mg by mouth See admin instructions. Take 2000 mg by mouth one hour before dental procedure 02/08/19   Volanda Napoleon, MD  hyoscyamine (LEVSIN SL) 0.125 MG SL tablet TAKE 1 TO 2 TABLETS BY MOUTH EVERY 4 HOURS AS NEEDED FOR NAUSEA, CRAMPING, BLOATING OR DIARRHEA Patient not taking: Reported on 02/26/2020 06/12/19   Ladene Artist, MD  lidocaine-prilocaine (EMLA) cream APPLY TO PAC 1 HOUR BEFORE ACCESS. Patient not taking: Reported on 02/26/2020 08/30/18   Volanda Napoleon, MD  LORazepam (ATIVAN) 0.5 MG tablet Take 1 tablet (0.5 mg total) by mouth every 8 (eight) hours as needed for sleep (Nausea or vomiting). Patient not taking: Reported on 02/26/2020 12/05/19   Vicie Mutters, PA-C  polyethylene glycol (MIRALAX / GLYCOLAX) 17 g packet Take 17 g by mouth daily. Patient not taking: Reported on 02/26/2020 01/12/19   Vicie Mutters, PA-C  PROAIR HFA 108 (90 Base) MCG/ACT inhaler INHALE 2 PUFFS EVERY 4 HOURS AS NEEDED INTO THE LUNGS FOR WHEEZING OR SHORTNESS OF BREATH. Patient taking differently: Inhale 2 puffs into the lungs every 4 (four) hours as needed for wheezing or shortness of breath.  07/22/17   Unk Pinto, MD  Respiratory Therapy Supplies (FLUTTER) DEVI Take as directed 03/18/16    Tanda Rockers, MD     Family History  Problem Relation Age of Onset  . Emphysema Father   . Heart disease Father   . Heart attack Father 22       Died  . Heart disease Brother 83  . Rheum arthritis Sister   . Hypertension Mother   . Thyroid disease Mother   . Colon cancer Neg Hx   . Stomach cancer Neg Hx     Social History   Socioeconomic History  . Marital status: Married    Spouse name: Not on file  . Number of children: 0  . Years of education: 90  . Highest education level: Not on file  Occupational History  . Occupation: retired from office work  Tobacco Use  . Smoking status: Never Smoker  . Smokeless tobacco: Never Used  . Tobacco comment: Never Used Tobacco  Vaping Use  . Vaping Use: Never used  Substance and Sexual Activity  . Alcohol use: No    Alcohol/week: 0.0 standard drinks  . Drug use: No  . Sexual activity: Not on file  Other Topics Concern  . Not on file  Social History Narrative   Patient lives with her husband in a two story home.  Has no children.  Retired from office work.  Education: some college.    Social Determinants of Health   Financial Resource Strain:   . Difficulty of Paying Living Expenses:   Food Insecurity:   . Worried About Charity fundraiser in the Last Year:   . Arboriculturist in the Last Year:   Transportation Needs:   . Film/video editor (Medical):   [name] Lack of Transportation (Non-Medical):   Physical Activity:   . Days of Exercise per Week:   . Minutes of Exercise per Session:   Stress:   . Feeling of Stress :   Social Connections:   . Frequency of Communication with Friends and Family:   . Frequency of Social Gatherings with Friends and Family:   . Attends Religious Services:   . Active Member of Clubs or Organizations:   . Attends Archivist Meetings:   [name] Marital Status:      Review of Systems: A 12 point ROS discussed and pertinent positives are indicated in the HPI above.  All other  systems are negative.   Vital Signs: BP 110/90   Pulse 67   Temp (!) 97.5 F (36.4 C) (Oral)   Resp 14   Ht 5\' 3"  (1.6 m)   Wt 110 lb (49.9 kg)   LMP 09/14/2014   SpO2 99%   BMI 19.49 kg/m   Physical Exam Vitals reviewed.  Constitutional:      Comments: Thin and frail  Cardiovascular:     Rate and Rhythm: Normal rate and regular rhythm.     Heart sounds: Normal heart sounds.  Pulmonary:     Effort: Pulmonary effort is normal.  Breath sounds: Normal breath sounds.  Abdominal:     Palpations: Abdomen is soft.  Musculoskeletal:        General: Normal range of motion.  Skin:    General: Skin is warm.  Neurological:     Mental Status: She is alert and oriented to person, place, and time.  Psychiatric:        Behavior: Behavior normal.     Imaging: No results found.  Labs:  CBC: Recent Labs    10/29/19 1013 12/05/19 1511 01/28/20 1142 02/04/20 1615  WBC 6.2 5.8 5.5 5.7  HGB 14.5 13.4 13.6 13.5  HCT 44.5 40.8 41.3 42.9  PLT 238 244 191 223    COAGS: No results for input(s): INR, APTT in the last 8760 hours.  BMP: Recent Labs    10/29/19 1013 12/05/19 1511 01/28/20 1142 02/04/20 1615  NA 140 136 137 139  K 4.2 4.4 4.3 4.7  CL 103 101 103 102  CO2 28 29 28 31   GLUCOSE 93 147* 97 90  BUN 14 15 14 18   CALCIUM 10.1 9.5 9.6 9.7  CREATININE 0.96* 0.82 0.85 0.98*  GFRNONAA 57* 70 >60 56*  GFRAA 67 81 >60 65    LIVER FUNCTION TESTS: Recent Labs    03/30/19 1233 04/17/19 1036 08/01/19 1200 08/01/19 1200 10/29/19 1013 12/05/19 1511 01/28/20 1142 02/04/20 1615  BILITOT 0.6   < > 0.4  --  0.6 0.4 0.5 0.4  AST 26   < > 25  --  26 25 24 27   ALT 13   < > 13  --  13 14 14 15   ALKPHOS 57  --  57  --   --   --  55  --   PROT 5.9*   < > 5.7*   < > 6.7 5.6* 5.8* 6.1  ALBUMIN 4.1  --  4.5  --   --   --  4.3  --    < > = values in this interval not displayed.    TUMOR MARKERS: No results for input(s): AFPTM, CEA, CA199, CHROMGRNA in the last  8760 hours.  Assessment and Plan:  Lymphoma in remission Dr Marin Olp requesting removal of [place] a cath Scheduled for this today Pt is aware of procedure benefits and risk including but not limited to Infection; bleeding; vessel damage Agreeable to proceed Consent signed and in chart  Thank you for this interesting consult.  I greatly enjoyed meeting KEA CALLAN and look forward to participating in their care.  A copy of this report was sent to the requesting provider on this date.  Electronically Signed: Lavonia Drafts, PA-C 03/04/2020, 8:21 AM   I spent a total of  30 Minutes   in face to face in clinical consultation, greater than 50% of which was counseling/coordinating care for [facility] removal

## 2020-03-04 NOTE — Procedures (Signed)
Interventional Radiology Procedure Note  Procedure: PORT REMOVAL  Complications: None  Estimated Blood Loss: MIN  Findings: UNCOMPLICATED RT IJ PORT REMOVAL

## 2020-03-04 NOTE — Discharge Instructions (Addendum)
Implanted Port Removal, Care After °This sheet gives you information about how to care for yourself after your procedure. Your health care provider may also give you more specific instructions. If you have problems or questions, contact your health care provider. °What can I expect after the procedure? °After the procedure, it is common to have: °· Soreness or pain near your incision. °· Some swelling or bruising near your incision. °Follow these instructions at home: °Medicines °· Take over-the-counter and prescription medicines only as told by your health care provider. °· If you were prescribed an antibiotic medicine, take it as told by your health care provider. Do not stop taking the antibiotic even if you start to feel better. °Bathing °· Do not take baths, swim, or use a hot tub until your health care provider approves. Ask your health care provider if you can take showers. You may only be allowed to take sponge baths. °Incision care ° °· Follow instructions from your health care provider about how to take care of your incision. Make sure you: °? Wash your hands with soap and water before you change your bandage (dressing). If soap and water are not available, use hand sanitizer. °? Change your dressing as told by your health care provider. °? Keep your dressing dry. °? Leave stitches (sutures), skin glue, or adhesive strips in place. These skin closures may need to stay in place for 2 weeks or longer. If adhesive strip edges start to loosen and curl up, you may trim the loose edges. Do not remove adhesive strips completely unless your health care provider tells you to do that. °· Check your incision area every day for signs of infection. Check for: °? More redness, swelling, or pain. °? More fluid or blood. °? Warmth. °? Pus or a bad smell. °Driving ° °· Do not drive for 24 hours if you were given a medicine to help you relax (sedative) during your procedure. °· If you did not receive a sedative, ask your  health care provider when it is safe to drive. °Activity °· Return to your normal activities as told by your health care provider. Ask your health care provider what activities are safe for you. °· Do not lift anything that is heavier than 10 lb (4.5 kg), or the limit that you are told, until your health care provider says that it is safe. °· Do not do activities that involve lifting your arms over your head. °General instructions °· Do not use any products that contain nicotine or tobacco, such as cigarettes and e-cigarettes. These can delay healing. If you need help quitting, ask your health care provider. °· Keep all follow-up visits as told by your health care provider. This is important. °Contact a health care provider if: °· You have more redness, swelling, or pain around your incision. °· You have more fluid or blood coming from your incision. °· Your incision feels warm to the touch. °· You have pus or a bad smell coming from your incision. °· You have pain that is not relieved by your pain medicine. °Get help right away if you have: °· A fever or chills. °· Chest pain. °· Difficulty breathing. °Summary °· After the procedure, it is common to have pain, soreness, swelling, or bruising near your incision. °· If you were prescribed an antibiotic medicine, take it as told by your health care provider. Do not stop taking the antibiotic even if you start to feel better. °· Do not drive for 24 hours   if you were given a sedative during your procedure. °· Return to your normal activities as told by your health care provider. Ask your health care provider what activities are safe for you. °This information is not intended to replace advice given to you by your health care provider. Make sure you discuss any questions you have with your health care provider. °Document Revised: 10/13/2017 Document Reviewed: 10/13/2017 °Elsevier Patient Education © 2020 [employer] °Moderate Conscious Sedation, Adult °Sedation is the  use of medicines to promote relaxation and relieve discomfort and anxiety. Moderate conscious sedation is a type of sedation. Under moderate conscious sedation, you are less alert than normal, but you are still able to respond to instructions, touch, or both. °Moderate conscious sedation is used during short medical and dental procedures. It is milder than deep sedation, which is a type of sedation under which you cannot be easily woken up. It is also milder than general anesthesia, which is the use of medicines to make you unconscious. Moderate conscious sedation allows you to return to your regular activities sooner. °Tell a health care provider about: °· Any allergies you have. °· All medicines you are taking, including vitamins, herbs, eye drops, creams, and over-the-counter medicines. °· Use of steroids (by mouth or creams). °· Any problems you or family members have had with sedatives and anesthetic medicines. °· Any blood disorders you have. °· Any surgeries you have had. °· Any medical conditions you have, such as sleep apnea. °· Whether you are pregnant or may be pregnant. °· Any use of cigarettes, alcohol, marijuana, or street drugs. °What are the risks? °Generally, this is a safe procedure. However, problems may occur, including: °· Getting too much medicine (oversedation). °· Nausea. °· Allergic reaction to medicines. °· Trouble breathing. If this happens, a breathing tube may be used to help with breathing. It will be removed when you are awake and breathing on your own. °· Heart trouble. °· Lung trouble. °What happens before the procedure? °Staying hydrated °Follow instructions from your health care provider about hydration, which may include: °· Up to 2 hours before the procedure - you may continue to drink clear liquids, such as water, clear fruit juice, black coffee, and plain tea. °Eating and drinking restrictions °Follow instructions from your health care provider about eating and drinking, which  may include: °· 8 hours before the procedure - stop eating heavy meals or foods such as meat, fried foods, or fatty foods. °· 6 hours before the procedure - stop eating light meals or foods, such as toast or cereal. °· 6 hours before the procedure - stop drinking milk or drinks that contain milk. °· 2 hours before the procedure - stop drinking clear liquids. °Medicine °Ask your health care provider about: °· Changing or stopping your regular medicines. This is especially important if you are taking diabetes medicines or blood thinners. °· Taking medicines such as aspirin and ibuprofen. These medicines can thin your blood. Do not take these medicines before your procedure if your health care provider instructs you not to. ° °Tests and exams °· You will have a physical exam. °· You may have blood tests done to show: °? How well your kidneys and liver are working. °? How well your blood can clot. °General instructions °· Plan to have someone take you home from the hospital or clinic. °· If you will be going home right after the procedure, plan to have someone with you for 24 hours. °What happens during the procedure? °·   An IV tube will be inserted into one of your veins. °· Medicine to help you relax (sedative) will be given through the IV tube. °· The medical or dental procedure will be performed. °What happens after the procedure? °· Your blood pressure, heart rate, breathing rate, and blood oxygen level will be monitored often until the medicines you were given have worn off. °· Do not drive for 24 hours. °This information is not intended to replace advice given to you by your health care provider. Make sure you discuss any questions you have with your health care provider. °Document Revised: 08/12/2017 Document Reviewed: 12/20/2015 °Elsevier Patient Education © 2020 [employer]vier Inc. ° °

## 2020-03-13 ENCOUNTER — Telehealth: Payer: Self-pay | Admitting: Neurology

## 2020-03-13 NOTE — Telephone Encounter (Signed)
Pt is asking for a call with the results to her sleep study, pt states its been 16 days.  Please call

## 2020-03-14 ENCOUNTER — Telehealth: Payer: Self-pay | Admitting: Student

## 2020-03-14 NOTE — Telephone Encounter (Signed)
IR.  Received call from patient regarding Prot-a-cath site. Hx Port-a-cath placed in IR 03/04/2020.  Patient asks if she can take bandage off Port. States she thinks bandage is irritating her skin. States that surgical glue is also "coming up". Denies pain, erythema, or drainage from Port-a-cath incision. Informed patient that it is ok to remove dressing and instructed patient to let surgical glue come off on its own. All questions answered and concerns addressed.  Please call IR with questions/concerns.   Shannon Graff Uzziel Russey, PA-C 03/14/2020, 1:43 PM

## 2020-03-19 ENCOUNTER — Telehealth: Payer: Self-pay

## 2020-03-19 NOTE — Telephone Encounter (Signed)
Shannon Parsons is a 79 y.o. female calling for her HST results. HST was done 02/18/20.  Pt requesting call back

## 2020-03-20 ENCOUNTER — Telehealth: Payer: Self-pay | Admitting: Neurology

## 2020-03-20 ENCOUNTER — Other Ambulatory Visit: Payer: Self-pay | Admitting: Neurology

## 2020-03-20 DIAGNOSIS — T451X5A Adverse effect of antineoplastic and immunosuppressive drugs, initial encounter: Secondary | ICD-10-CM | POA: Insufficient documentation

## 2020-03-20 DIAGNOSIS — J4542 Moderate persistent asthma with status asthmaticus: Secondary | ICD-10-CM | POA: Insufficient documentation

## 2020-03-20 DIAGNOSIS — J449 Chronic obstructive pulmonary disease, unspecified: Secondary | ICD-10-CM

## 2020-03-20 DIAGNOSIS — J479 Bronchiectasis, uncomplicated: Secondary | ICD-10-CM | POA: Insufficient documentation

## 2020-03-20 DIAGNOSIS — J984 Other disorders of lung: Secondary | ICD-10-CM | POA: Insufficient documentation

## 2020-03-20 DIAGNOSIS — G4733 Obstructive sleep apnea (adult) (pediatric): Secondary | ICD-10-CM | POA: Insufficient documentation

## 2020-03-20 DIAGNOSIS — J470 Bronchiectasis with acute lower respiratory infection: Secondary | ICD-10-CM | POA: Insufficient documentation

## 2020-03-20 NOTE — Procedures (Signed)
Patient Information     First Name: Shannon Last Name: Parsons ID: [id number]  Birth Date:          11-Jul-1941 Age: 79 Gender: Female  Referring Provider: Danna Hefty BMI: 19.5 (W=110 lb, H=5' 3'')  Neck Circ.:  14 '' Epworth:  2/24  Sleep Study Information    Study Date: 02/18/20 Data were only retrieved on 03-19-2020 S/H/A Version: [id number] / 4.1.1528 / 77                                                                           History:    Shannon Parsons is a 79 y.o. female patient, last seen in November 2019 (so about 18 months ago).  At the time she felt that her sleep had improved to a level where another evaluation would not be needed.  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 Lavone Neri as a pulmonologist.  She has a history of hypertension, GERD, prediabetes, diverticulosis, generalized anxiety, cholesterol and vitamin D and is being treated almost being treated for a mantle cell carcinoma is Dr. Burney Gauze.  The treatment has been complicated when she developed a deep venous thrombosis.   She had received the Covid vaccine in February, she has been now fully vaccinated.  For the last 6 months she has not been feeling well it may by now be 8 months.  Her main problem is trouble sleeping and her CPAP has not been used in over 2 years. There was a good reason for her to avoid CPAP as she felt that it increased her frequency of bronchitis.  She has often sporadic nausea and is vomiting sometimes -and she has felt that lorazepam helped to overcome nausea as well as anxiety and introducing sleep.  These nausea spells happen not infrequently about 4 hours after she had a meal and she does not think it specific to any foods.  She has no trouble swallowing food. Here to screen for OSA.       Summary & Diagnosis:      There is still a severe sleep apnea noted, AHI 40/h and REM sleep AHI of 36/h. Supine sleep  is most prone to apnea. There was no prolonged hypoxemia.   Recommendations:      This apnea severity is too high to use any treatment other than CPAP. I like to use an autotitration machine with heated humidity and settings of 3 cm EPR and 6-18 cm water pressure. Mask of patient's choice and comfort.   Interpreting Physician: Larey Seat, MD  PS: I have to excuse to the patient that her data were not available earlier than 03-19-2020 due to a software failure. CD           Sleep Summary  Oxygen Saturation Statistics   Start Study Time: End Study Time: Total Recording Time:  10:01:53PM 6:29:33 AM 8 h, 65min  Total Sleep Time % REM of Sleep Time:  7 h, 17 min  16.9    Mean: 93 Minimum: 81 Maximum: 98  Mean of Desaturations Nadirs (%):   90  Oxygen Desaturation. %:   4-9 10-20 >20 Total  Events Number Total   131  3 97.8 2.2  0 0.0  134 100.0  Oxygen Saturation: <90 <=88 <85 <80 <70  Duration (minutes): Sleep % 6.3 1.4  1.9 0.3  0.4 0.1 0.0 0.0 0.0 0.0     Respiratory Indices      Total Events REM NREM All Night  pRDI:  278  pAHI:  266 ODI:  134  pAHIc:   9  % CSR: 0.0 37.8 36.9 21.8 5.0 43.7 41.6 20.3 0.6 42.6 40.8 20.5 1.4       Pulse Rate Statistics during Sleep (BPM)      Mean:  60 Minimum: 35  Maximum: 101    Indices are calculated using technically valid sleep time of  6 h, 31 min. pRDI/pAHI are calculated using 02 desaturations ? 3% Sit  Body Position Statistics  Position Supine Prone Right Left Non-Supine  Sleep (min) 419.2 0.0 17.5 1.0 18.5  Sleep % 95.8 0.0 4.0 0.2 4.2  pRDI 43.8 N/A 14.0 N/A 16.9  pAHI 42.1 N/A 10.5 N/A 13.5  ODI 21.2 N/A 3.5 N/A 6.8     Snoring Statistics Snoring Level (dB) >40 >50 >60 >70 >80 >Threshold (45)  Sleep (min) 277.6 12.3 3.1 0.0 0.0 33.6  Sleep % 63.4 2.8 0.7 0.0 0.0 7.7    Mean: 42

## 2020-03-20 NOTE — Telephone Encounter (Signed)
-----   Message from Larey Seat, MD sent at 03/20/2020 10:54 AM EDT ----- Summary & Diagnosis:     There is still a severe sleep apnea noted, AHI 40/h and REM sleep  AHI of 36/h. Supine sleep is most prone to apnea.  There was no prolonged hypoxemia.   Recommendations:     This apnea severity is too high to use any treatment other than  CPAP. I like to use an autotitration machine with heated humidity  and settings of 3 cm EPR and 6-18 cm water pressure. Mask of  patient's choice and comfort.   Interpreting Physician: Larey Seat, MD  PS: I have to excuse to the patient that her data were not  available earlier than 03-19-2020 due to a software failure. CD

## 2020-03-20 NOTE — Telephone Encounter (Signed)
I called pt. I advised pt that Dr. Brett Fairy reviewed their sleep study results and found that pt has severe sleep apnea. Dr. Brett Fairy recommends that pt starts auto CPAP. I reviewed PAP compliance expectations with the pt. Pt is agreeable to starting a CPAP. I advised pt that an order will be sent to a DME, [facility] and [facility] will call the pt within about one week after they file with the pt's insurance. [facility]) will show the pt how to use the machine, fit for masks, and troubleshoot the CPAP if needed. A follow up appt was made for insurance purposes with Dr. Brett Fairy on Sept 9,2021 at 1:30 pm. Pt verbalized understanding to arrive 15 minutes early and bring their CPAP. A letter with all of this information in it will be mailed to the pt as a reminder. I verified with the pt that the address we have on file is correct. Pt verbalized understanding of results. Pt had no questions at this time but was encouraged to call back if questions arise. I have sent the order to [facility]) and have received confirmation that they have received the order.

## 2020-03-20 NOTE — Progress Notes (Signed)
Summary & Diagnosis:     There is still a severe sleep apnea noted, AHI 40/h and REM sleep  AHI of 36/h. Supine sleep is most prone to apnea.  There was no prolonged hypoxemia.   Recommendations:     This apnea severity is too high to use any treatment other than  CPAP. I like to use an autotitration machine with heated humidity  and settings of 3 cm EPR and 6-18 cm water pressure. Mask of  patient's choice and comfort.   Interpreting Physician: Larey Seat, MD  PS: I have to excuse to the patient that her data were not  available earlier than 03-19-2020 due to a software failure. CD

## 2020-03-26 ENCOUNTER — Ambulatory Visit: Payer: PPO | Admitting: Podiatry

## 2020-03-31 ENCOUNTER — Other Ambulatory Visit: Payer: Self-pay | Admitting: Physician Assistant

## 2020-03-31 DIAGNOSIS — N8111 Cystocele, midline: Secondary | ICD-10-CM

## 2020-04-03 ENCOUNTER — Ambulatory Visit: Payer: PPO | Admitting: Podiatry

## 2020-04-03 ENCOUNTER — Encounter: Payer: Self-pay | Admitting: Podiatry

## 2020-04-03 ENCOUNTER — Other Ambulatory Visit: Payer: Self-pay | Admitting: Podiatry

## 2020-04-03 ENCOUNTER — Ambulatory Visit (INDEPENDENT_AMBULATORY_CARE_PROVIDER_SITE_OTHER): Payer: PPO

## 2020-04-03 ENCOUNTER — Other Ambulatory Visit: Payer: Self-pay

## 2020-04-03 DIAGNOSIS — M2041 Other hammer toe(s) (acquired), right foot: Secondary | ICD-10-CM

## 2020-04-03 DIAGNOSIS — M2012 Hallux valgus (acquired), left foot: Secondary | ICD-10-CM

## 2020-04-03 DIAGNOSIS — R52 Pain, unspecified: Secondary | ICD-10-CM | POA: Diagnosis not present

## 2020-04-07 NOTE — Progress Notes (Signed)
Subjective:   Patient ID: Shannon Parsons, female   DOB: 79 y.o.   MRN: [id number]   HPI Patient presents stating that she is concerned because the big toe is over top the second toe left and she also has a digital deformity on the right that she states seems to have lifted more recently.  Patient states that she has trouble with both of these issues and she just wants to make sure they are not getting worse and whether or not anything should be done   ROS      Objective:  Physical Exam  Neurovascular status was found to be intact muscle strength adequate range of motion mildly diminished.  Patient does have structural malalignment of the left hallux over top the second toe and does have moderate elevation of the third digit right with keratotic tissue formation.  Patient is found to have good digital perfusion well oriented     Assessment:  Digital deformity left hallux over second digit along with third digit hammertoe right moderate discomfort     Plan:  H&P x-rays reviewed and at this point I have recommended wider shoes padding therapy which was dispensed and softer materials along with topical medicines as needed .  I did discuss surgical intervention but do not recommend at the current time  X-rays indicate that there is moderate to structural malalignment left over right foot with deviation of the hallux against second toe left foot

## 2020-04-28 DIAGNOSIS — G4733 Obstructive sleep apnea (adult) (pediatric): Secondary | ICD-10-CM | POA: Diagnosis not present

## 2020-05-01 ENCOUNTER — Ambulatory Visit: Payer: PPO | Admitting: Adult Health

## 2020-05-05 NOTE — Progress Notes (Signed)
History of Present Illness:       This very nice 79 y.o.  MWF presents for 6 month follow up with HTN, HLD, Hypothyroidism, Essential Tremor, Pre-Diabetes and Vitamin D Deficiency.   Patient also has hx/o OSA & had been off of CPA_) for 1 year, but relates now back on CPAP for 1 week with improved sleep hygiene.        In 2016, patient was dx'd and treated for Mantle Cell Lymphoma by Dr Marin Olp and is in remission for 5 years. In 2016, she also had a RLE DVT tx'd with Xarelto x 6 months.       Patient is  followed expectantly  for labile HTN & BP has been controlled at home. Today's BP is at goal - 122/76. Patient has had no complaints of any cardiac type chest pain, palpitations, dyspnea / orthopnea / PND, dizziness, claudication, or dependent edema.      Hyperlipidemia is controlled with diet &  Rosuvastatin. Patient denies myalgias or other med SE's. Last Lipids were at goal:  Lab Results  Component Value Date   CHOL 189 02/04/2020   HDL 73 02/04/2020   LDLCALC 96 02/04/2020   TRIG 104 02/04/2020   CHOLHDL 2.6 02/04/2020    Also, the patient has history of PreDiabetes and has had no symptoms of reactive hypoglycemia, diabetic polys, paresthesias or visual blurring.  Last A1c was Normal & at goal:  Lab Results  Component Value Date   HGBA1C 5.4 02/04/2020           Further, the patient also has history of Vitamin D Deficiency and supplements vitamin D without any suspected side-effects. Last vitamin D was at goal:  Lab Results  Component Value Date   VD25OH 58 02/04/2020    Current Outpatient Medications on File Prior to Visit  Medication Sig  . acidophilus (RISAQUAD) CAPS capsule Take 1 capsule by mouth 3 (three) times a week.  [name] amoxicillin (AMOXIL) 500 MG capsule TAKE 4 CAPSULES BY MOUTH ONE HOUR BEFORE DENTAL PROCEDURE. (Patient taking differently: Take 2,000 mg by mouth See admin instructions. Take 2000 mg by mouth one hour before dental procedure)  .  Cholecalciferol (VITAMIN D3) 50 MCG (2000 UT) capsule Take 2,000 Units by mouth daily.   [name] levothyroxine (SYNTHROID) 50 MCG tablet Take 1 tablet daily on an empty stomach with only water for 30 minutes & no Antacid meds, Calcium or Magnesium for 4 hours & avoid Biotin (Patient taking differently: Take 25-50 mcg by mouth See admin instructions. Take 25 mcg by mouth Monday - Saturday before breakfast and take 50 mcg before breakfast on Sunday)  . PROAIR HFA 108 (90 Base) MCG/ACT inhaler INHALE 2 PUFFS EVERY 4 HOURS AS NEEDED INTO THE LUNGS FOR WHEEZING OR SHORTNESS OF BREATH. (Patient taking differently: Inhale 2 puffs into the lungs every 4 (four) hours as needed for wheezing or shortness of breath. )  . Respiratory Therapy Supplies (FLUTTER) DEVI Take as directed  . rosuvastatin (CRESTOR) 10 MG tablet 1/4 tablet every day (Patient taking differently: Take 2.5 mg by mouth daily. )   Current Facility-Administered Medications on File Prior to Visit  Medication  . ipratropium-albuterol (DUONEB) 0.5-2.5 (3) MG/3ML nebulizer solution 3 mL    Allergies  Allergen Reactions  . Alendronate Other (See Comments)    "made me choke"    . Risedronate Sodium Other (See Comments)    "made me choke"    . Sulfa Antibiotics Other (  See Comments)    Doesn't remember   . Sulfasalazine Other (See Comments)    Doesn't remember   . Other Rash  . Tetracyclines & Related Rash    PMHx:   Past Medical History:  Diagnosis Date  . Adenomatous colon polyp 1994  . Asthma   . Cataract 2013   bilateral eyes  . Diverticulosis   . Family history of ischemic heart disease   . Fibrocystic breast disease   . GERD (gastroesophageal reflux disease)   . Hiatal hernia   . Hyperlipidemia   . Hypertension    pt denies - 01/16/18  . IBS (irritable bowel syndrome)   . Internal hemorrhoids   . Mantle cell lymphoma ([place]) 10/18/2014  . Melanoma ([place])    Facial, pt denies on 01/16/18  . OSA (obstructive sleep apnea)   .  Osteoarthritis   . Osteoporosis   . Prediabetes   . Unspecified hypothyroidism   . Vitamin D deficiency     Immunization History  Administered Date(s) Administered  . PFIZER SARS-COV-2 Vaccination 10/29/2019, 11/19/2019  . Pneumococcal Conjugate-13 03/18/2016  . Pneumococcal Polysaccharide-23 06/18/2013  . Zoster 04/17/2013    Past Surgical History:  Procedure Laterality Date  . APPENDECTOMY    . BUNIONECTOMY    . CATARACT EXTRACTION, BILATERAL    . IR REMOVAL TUN ACCESS W/ PORT W/O FL MOD SED  03/04/2020  . KNEE ARTHROSCOPY    . TONSILLECTOMY    . UMBILICAL HERNIA REPAIR    . VIDEO BRONCHOSCOPY Bilateral 10/12/2017   Procedure: VIDEO BRONCHOSCOPY WITHOUT FLUORO;  Surgeon: Tanda Rockers, MD;  Location: [facility];  Service: Endoscopy;  Laterality: Bilateral;    FHx:    Reviewed / unchanged  SHx:    Reviewed / unchanged   Systems Review:  Constitutional: Denies fever, chills, wt changes, headaches, insomnia, fatigue, night sweats, change in appetite. Eyes: Denies redness, blurred vision, diplopia, discharge, itchy, watery eyes.  ENT: Denies discharge, congestion, post nasal drip, epistaxis, sore throat, earache, hearing loss, dental pain, tinnitus, vertigo, sinus pain, snoring.  CV: Denies chest pain, palpitations, irregular heartbeat, syncope, dyspnea, diaphoresis, orthopnea, PND, claudication or edema. Respiratory: denies cough, dyspnea, DOE, pleurisy, hoarseness, laryngitis, wheezing.  Gastrointestinal: Denies dysphagia, odynophagia, heartburn, reflux, water brash, abdominal pain or cramps, nausea, vomiting, bloating, diarrhea, constipation, hematemesis, melena, hematochezia  or hemorrhoids. Genitourinary: Denies dysuria, frequency, urgency, nocturia, hesitancy, discharge, hematuria or flank pain. Musculoskeletal: Denies arthralgias, myalgias, stiffness, jt. swelling, pain, limping or strain/sprain.  Skin: Denies pruritus, rash, hives, warts, acne, eczema or change in  skin lesion(s). Neuro: No weakness, tremor, incoordination, spasms, paresthesia or pain. Psychiatric: Denies confusion, memory loss or sensory loss. Endo: Denies change in weight, skin or hair change.  Heme/Lymph: No excessive bleeding, bruising or enlarged lymph nodes.  Physical Exam  BP 122/76   Pulse 68   Temp (!) 97 F (36.1 C)   Resp 16   Ht 5\' 4"  (1.626 m)   Wt 112 lb 9.6 oz (51.1 kg)   LMP 09/14/2014   BMI 19.33 kg/m   Appears  well nourished, well groomed  and in no distress.  Eyes: PERRLA, EOMs, conjunctiva no swelling or erythema. Sinuses: No frontal/maxillary tenderness ENT/Mouth: EAC's clear, TM's nl w/o erythema, bulging. Nares clear w/o erythema, swelling, exudates. Oropharynx clear without erythema or exudates. Oral hygiene is good. Tongue normal, non obstructing. Hearing intact.  Neck: Supple. Thyroid not palpable. Car 2+/2+ without bruits, nodes or JVD. Chest: Respirations nl with BS clear & equal  w/o rales, rhonchi, wheezing or stridor.  Cor: Heart sounds normal w/ regular rate and rhythm without sig. murmurs, gallops, clicks or rubs. Peripheral pulses normal and equal  without edema.  Abdomen: Soft & bowel sounds normal. Non-tender w/o guarding, rebound, hernias, masses or organomegaly.  Lymphatics: Unremarkable.  Musculoskeletal: Full ROM all peripheral extremities, joint stability, 5/5 strength and normal gait.  Skin: Warm, dry without exposed rashes, lesions or ecchymosis apparent.  Neuro: Cranial nerves intact, reflexes equal bilaterally. Sensory-motor testing grossly intact. Tendon reflexes grossly intact.  Pysch: Alert & oriented x 3.  Insight and judgement nl & appropriate. No ideations.  Assessment and Plan:  1. Labile hypertension  - CBC with Differential/Platelet - COMPLETE METABOLIC PANEL WITH GFR - Magnesium - TSH  2. Hyperlipidemia, mixed  - Continue diet/meds, exercise,& lifestyle modifications.  - Contin - Continue medication, monitor  blood pressure at home.  - Continue DASH diet.  Reminder to go to the ER if any CP,  SOB, nausea, dizziness, severe HA, changes vision/speech. ue monitor periodic cholesterol/liver & renal functions   - Lipid panel - TSH  3. Hypothyroidism  - Continue diet, exercise  - Lifestyle modifications.  - Monitor appropriate labs.  - TSH  4. Abnormal glucose  - Continue supplementation.  - Hemoglobin A1c - Insulin, random  5. Vitamin D deficiency  - VITAMIN D 25 Hydroxy  6. Medication management  - CBC with Differential/Platelet - COMPLETE METABOLIC PANEL WITH GFR - Magnesium - Lipid panel - TSH - Hemoglobin A1c - Insulin, random - VITAMIN D 25 Hydroxy         Discussed  regular exercise, BP monitoring, weight control to achieve/maintain BMI less than 25 and discussed med and SE's. Recommended labs to assess and monitor clinical status with further disposition pending results of labs.  I discussed the assessment and treatment plan with the patient. The patient was provided an opportunity to ask questions and all were answered. The patient agreed with the plan and demonstrated an understanding of the instructions.  I provided over 30 minutes of exam, counseling, chart review and  complex critical decision making.   Kirtland Bouchard, MD

## 2020-05-05 NOTE — Patient Instructions (Signed)

## 2020-05-06 ENCOUNTER — Other Ambulatory Visit: Payer: Self-pay

## 2020-05-06 ENCOUNTER — Ambulatory Visit (INDEPENDENT_AMBULATORY_CARE_PROVIDER_SITE_OTHER): Payer: PPO | Admitting: Internal Medicine

## 2020-05-06 VITALS — BP 122/76 | HR 68 | Temp 97.0°F | Resp 16 | Ht 64.0 in | Wt 112.6 lb

## 2020-05-06 DIAGNOSIS — R7309 Other abnormal glucose: Secondary | ICD-10-CM | POA: Diagnosis not present

## 2020-05-06 DIAGNOSIS — E559 Vitamin D deficiency, unspecified: Secondary | ICD-10-CM | POA: Diagnosis not present

## 2020-05-06 DIAGNOSIS — E782 Mixed hyperlipidemia: Secondary | ICD-10-CM

## 2020-05-06 DIAGNOSIS — G250 Essential tremor: Secondary | ICD-10-CM | POA: Diagnosis not present

## 2020-05-06 DIAGNOSIS — R0989 Other specified symptoms and signs involving the circulatory and respiratory systems: Secondary | ICD-10-CM

## 2020-05-06 DIAGNOSIS — Z79899 Other long term (current) drug therapy: Secondary | ICD-10-CM | POA: Diagnosis not present

## 2020-05-06 DIAGNOSIS — E039 Hypothyroidism, unspecified: Secondary | ICD-10-CM | POA: Diagnosis not present

## 2020-05-06 MED ORDER — DIAZEPAM 5 MG PO TABS: ORAL_TABLET | ORAL | 0 refills | Status: DC

## 2020-05-07 LAB — HEMOGLOBIN A1C
Hgb A1c MFr Bld: 5.5 % of total Hgb (ref <5.7)
Mean Plasma Glucose: 111 (calc)
eAG (mmol/L): 6.2 (calc)

## 2020-05-07 LAB — COMPLETE METABOLIC PANEL WITH GFR
AG Ratio: 2.5 (calc) (ref 1.0–2.5)
ALT: 14 U/L (ref 6–29)
AST: 26 U/L (ref 10–35)
Albumin: 4.3 g/dL (ref 3.6–5.1)
Alkaline phosphatase (APISO): 50 U/L (ref 37–153)
BUN: 15 mg/dL (ref 7–25)
CO2: 28 mmol/L (ref 20–32)
Calcium: 9.5 mg/dL (ref 8.6–10.4)
Chloride: 101 mmol/L (ref 98–110)
Creat: 0.93 mg/dL (ref 0.60–0.93)
GFR, Est African American: 69 mL/min/{1.73_m2} (ref >=60)
GFR, Est Non African American: 60 mL/min/{1.73_m2} (ref >=60)
Globulin: 1.7 g/dL (calc) — ABNORMAL LOW (ref 1.9–3.7)
Glucose, Bld: 74 mg/dL (ref 65–99)
Potassium: 4.1 mmol/L (ref 3.5–5.3)
Sodium: 136 mmol/L (ref 135–146)
Total Bilirubin: 0.4 mg/dL (ref 0.2–1.2)
Total Protein: 6 g/dL — ABNORMAL LOW (ref 6.1–8.1)

## 2020-05-07 LAB — LIPID PANEL
Cholesterol: 174 mg/dL (ref <200)
HDL: 65 mg/dL (ref >=50)
LDL Cholesterol (Calc): 86 mg/dL (calc)
Non-HDL Cholesterol (Calc): 109 mg/dL (calc) (ref <130)
Total CHOL/HDL Ratio: 2.7 (calc) (ref <5.0)
Triglycerides: 125 mg/dL (ref <150)

## 2020-05-07 LAB — CBC WITH DIFFERENTIAL/PLATELET
Absolute Monocytes: 726 cells/uL (ref 200–950)
Basophils Absolute: 78 cells/uL (ref 0–200)
Basophils Relative: 1.3 %
Eosinophils Absolute: 138 cells/uL (ref 15–500)
Eosinophils Relative: 2.3 %
HCT: 40.3 % (ref 35.0–45.0)
Hemoglobin: 13.2 g/dL (ref 11.7–15.5)
Lymphs Abs: 1416 cells/uL (ref 850–3900)
MCH: 29.6 pg (ref 27.0–33.0)
MCHC: 32.8 g/dL (ref 32.0–36.0)
MCV: 90.4 fL (ref 80.0–100.0)
MPV: 9.9 fL (ref 7.5–12.5)
Monocytes Relative: 12.1 %
Neutro Abs: 3642 cells/uL (ref 1500–7800)
Neutrophils Relative %: 60.7 %
Platelets: 201 10*3/uL (ref 140–400)
RBC: 4.46 10*6/uL (ref 3.80–5.10)
RDW: 12.7 % (ref 11.0–15.0)
Total Lymphocyte: 23.6 %
WBC: 6 10*3/uL (ref 3.8–10.8)

## 2020-05-07 LAB — MAGNESIUM: Magnesium: 2 mg/dL (ref 1.5–2.5)

## 2020-05-07 LAB — TSH: TSH: 3.59 mIU/L (ref 0.40–4.50)

## 2020-05-07 LAB — VITAMIN D 25 HYDROXY (VIT D DEFICIENCY, FRACTURES): Vit D, 25-Hydroxy: 50 ng/mL (ref 30–100)

## 2020-05-07 LAB — INSULIN, RANDOM: Insulin: 4.1 u[IU]/mL

## 2020-05-07 NOTE — Progress Notes (Signed)
====================================================  -   Test results slightly outside the reference range are not unusual. If there is anything important, I will review this with you,  otherwise it is considered normal test values.  If you have further questions,  please do not hesitate to contact me at the office or via My Chart.  ====================================================  -  Total Chol = 174  and LDL = 86 - Both  Excellent   - Very low risk for Heart Attack  / Stroke ====================================================  - A1c - Normal - Great - No Diabetes  ====================================================  - Vitamin D = 50 is OK, but  - Vitamin D goal is between 70-100.   $$$$$$$$$$$$$$$$$$$$$$$$$$$$$$$$$$$$$$$$$$$$$$$$$$$$$$  - Please INCREASE your Vitamin D up to 5,000 units /Daily  $$$$$$$$$$$$$$$$$$$$$$$$$$$$$$$$$$$$$$$$$$$$$$$$$$$$$$  - It is very important as a natural anti-inflammatory and helping the  immune system protect against viral infections, like the Covid-19    helping hair, skin, and nails, as well as reducing stroke and  heart attack risk.   - It helps your bones and helps with mood.  - It also decreases numerous cancer risks so please take  it as directed.   - Low Vit D is associated with a 200-300% higher risk for CANCER   and 200-300% higher risk for HEART   ATTACK  &  STROKE.    - It is also associated with higher death rate at younger ages,   autoimmune diseases like Rheumatoid arthritis, Lupus,  Multiple Sclerosis.     - Also many other serious conditions, like depression, Alzheimer's  Dementia, infertility, muscle aches, fatigue, fibromyalgia - just to name a few.  ========================================================= All Else - CBC - Kidneys - Electrolytes - Liver - Magnesium & Thyroid    - all  Normal / OK ====================================================

## 2020-05-08 ENCOUNTER — Encounter: Payer: Self-pay | Admitting: *Deleted

## 2020-05-10 ENCOUNTER — Encounter: Payer: Self-pay | Admitting: Internal Medicine

## 2020-05-22 ENCOUNTER — Ambulatory Visit: Payer: Self-pay | Admitting: Neurology

## 2020-05-29 DIAGNOSIS — G4733 Obstructive sleep apnea (adult) (pediatric): Secondary | ICD-10-CM | POA: Diagnosis not present

## 2020-06-06 DIAGNOSIS — R159 Full incontinence of feces: Secondary | ICD-10-CM | POA: Diagnosis not present

## 2020-06-06 DIAGNOSIS — K589 Irritable bowel syndrome without diarrhea: Secondary | ICD-10-CM | POA: Insufficient documentation

## 2020-06-06 DIAGNOSIS — C8590 Non-Hodgkin lymphoma, unspecified, unspecified site: Secondary | ICD-10-CM | POA: Insufficient documentation

## 2020-06-06 DIAGNOSIS — J45909 Unspecified asthma, uncomplicated: Secondary | ICD-10-CM | POA: Insufficient documentation

## 2020-06-11 DIAGNOSIS — H15831 Staphyloma posticum, right eye: Secondary | ICD-10-CM | POA: Diagnosis not present

## 2020-06-11 DIAGNOSIS — H524 Presbyopia: Secondary | ICD-10-CM | POA: Diagnosis not present

## 2020-06-11 DIAGNOSIS — H26493 Other secondary cataract, bilateral: Secondary | ICD-10-CM | POA: Diagnosis not present

## 2020-06-11 DIAGNOSIS — H04123 Dry eye syndrome of bilateral lacrimal glands: Secondary | ICD-10-CM | POA: Diagnosis not present

## 2020-06-18 ENCOUNTER — Other Ambulatory Visit: Payer: Self-pay | Admitting: Gastroenterology

## 2020-06-18 ENCOUNTER — Other Ambulatory Visit: Payer: Self-pay | Admitting: Internal Medicine

## 2020-06-28 DIAGNOSIS — G4733 Obstructive sleep apnea (adult) (pediatric): Secondary | ICD-10-CM | POA: Diagnosis not present

## 2020-06-30 ENCOUNTER — Encounter: Payer: Self-pay | Admitting: Neurology

## 2020-07-01 ENCOUNTER — Encounter: Payer: Self-pay | Admitting: Neurology

## 2020-07-01 ENCOUNTER — Ambulatory Visit: Payer: PPO | Admitting: Neurology

## 2020-07-01 VITALS — BP 132/78 | HR 42 | Ht 63.5 in | Wt 114.0 lb

## 2020-07-01 DIAGNOSIS — C8312 Mantle cell lymphoma, intrathoracic lymph nodes: Secondary | ICD-10-CM

## 2020-07-01 DIAGNOSIS — J449 Chronic obstructive pulmonary disease, unspecified: Secondary | ICD-10-CM | POA: Diagnosis not present

## 2020-07-01 DIAGNOSIS — Z9989 Dependence on other enabling machines and devices: Secondary | ICD-10-CM | POA: Diagnosis not present

## 2020-07-01 DIAGNOSIS — G4733 Obstructive sleep apnea (adult) (pediatric): Secondary | ICD-10-CM

## 2020-07-01 MED ORDER — ZALEPLON 5 MG PO CAPS: 5.0000 mg | ORAL_CAPSULE | Freq: Every evening | ORAL | 3 refills | Status: DC | PRN

## 2020-07-01 NOTE — Progress Notes (Signed)
SLEEP MEDICINE CLINIC   Provider:  Larey Seat, M D  Primary Care Physician:  Unk Pinto, MD   Referring Provider: Unk Pinto, MD    Chief Complaint  Patient presents with  . Follow-up    pt alone, rm 10. presents today for initial CPAP follow up. states that she is not getting a good amount of sleep with using it. she states the first night she had 6 hrs of sleep but since then she has not.     HPI:  Shannon Parsons is a 79 y.o. female patient , and here for a RV on her apnea; she underwent a sleep study-: she had been a cpap user for many years before we re-evaluated her.  She underwent a sleep study on 6-7 2021 in form of a home sleep test.  I was mildly surprised that she had severe sleep apnea with an AHI of 50/h and REM sleep AHI of 36/h.  Supine sleep was the most prone apnea position.  There was no prolonged hypoxemia and I asked her to resume CPAP she uses a auto titration CPAP and the minimum pressure set at 6 the maximum pressure of 18 cmH2O with 3 cm expiratory pressure relief.  She has been highly compliant 97% of the days 87% compliance by hours with an average usage time of 5 hours 23 minutes each night.  Her residual AHI is 0.6/h which is excellent she has no central apneas among the residual.  Her 95th percentile pressure is 10.3 cm water - so we can really reduce the maximum pressure of her CPAP. She does have moderate to severe air leakage and wonders about a different mask. She has nasal pillows. The headgear is loosing it's elasticity quickly- She already switched and  uses a Bella swift with ear loops in XS size. [name]  She has dry eyes , but no aerophagia.      Whom I have encountered last in November 2019 so about 18 months ago.  At the time she felt that her sleep had improved to a level where another evaluation would not be needed.  My diagnosis at the time was a breathing disorder related to chemotherapy induced lung changes and she has followed Dr.  Lavone Neri as a pulmonologist.  She is meanwhile 79 years old, has a history of hypertension, GERD, prediabetes, diverticulosis, generalized anxiety, cholesterol and vitamin D and is being treated almost being treated for a mantle cell carcinoma is Dr. Burney Gauze.  The treatment has been  complicated when she developed a deep venous thrombosis.   She had received the Covid vaccine in February, she has been now fully vaccinated.  For the last 3 to 6 months she has not been feeling well it may by now be 6 to 8 months.  Her main problem is trouble sleeping and her CPAP has not been used in over 2 years.  There was a good reason for her to avoid CPAP as she felt that it increased her frequency of bronchitic infections.  She has often sporadic nausea and a feeling of being sick to her stomach vomiting sometimes and she has felt that lorazepam helped to overcome nausea as well as anxiety and introducing sleep.  These nausea spells happen not infrequently about 4 hours after she had a meal and she does not think it specific to any foods.  She has no trouble swallowing food.    Since she has a history of now untreated sleep apnea (  she is not on a CPAP)  her primary care office , notable PA Vicie Mutters, wanted her sleep reevaluated.   The patient has been living with a lot more stress, neurology and anxiety since her husband has dementia symptoms. Her main concern is insomnia, not physically related to any breathing difficulties but to IBS, frequent stools at night , and causing anxiety. She feels overwhelmed by taking on his appointments, his financial papers, the tax returns.   She goes to bed at 10 and often is still awake at midnight, every 2 hours waking up, and sometimes being unable to return to sleep for an hour in between, she is exhausted.   She was seen here on 07-20-2018 in a referral  from Dr. Melford Aase for transfer of sleep apnea care.  Chief complaint according to patient : " I don't like using it (  CPAP ), and I do have frequent Bronchitis. I have lymphoma. I never saw a sleep doctor and was given an AUTO CPAP without titration. My baseline AHI 5 years ago was 16.0 / h". "I may not need it anymore ? " I had the pleasure of seeing [name] at [facility] on the 20 July 2018, as a new patient to my sleep clinic.   She states that over 5 years ago she had undergone an overnight polysomnographic study at [facility] long hospital location, interpreted by Dr. Baird Lyons.  She recalls that she had only mild apnea at an AHI of 16/h and was prescribed an auto CPAP soon after.  She had used the machine compliantly for several years but then developed more and more often upper airway infections bronchitis and sinusitis and felt that this may be triggered by CPAP use.  Just by end of September she restarted using CPAP to give [place] some data to.  From 20 September through October she had used the machine 31 out of 35 days with an average daily use of time of 5 hours and 19 minutes, set pressure of 10 cm is seen on her download with 3 cm EPR and very high air leaks are noted.  The residual AHI is only 0.2/h a good resolution of apnea as noted but the air leaks are quite substantial.  She is using nasal pillows that were fitted especially for her to be worn with loops around the ears rather than a strap on the back of her head.   Sleep/ Medical history: 1)The patient had developed a plethora of new medical problems over the last 5 to 7 years.  She was diagnosed with atherosclerosis of the ascending aorta,  2)chronic obstructive airway disease with asthma -she is using inhalers as needed and continues to follow-up with pulmonary care.   3) hypotension blood pressure at home is as low as 90/60 mmHg.  She did continue with the DASH diet restricting her sodium intake, she does keep hydrated.   4)She has hypercholesterolemia on rosuvastatin-Crestor.  5) Prediabetes.  6) Hypothyroidism. 7) Vitamin D deficiency.    8)The patient was diagnosed in January 2016 with a mantle cell Lymphoma and by September 2016 was involved and chemotherapy with Dr. Marin Olp and maintained through November 2018 she did achieve remission.  Dr. Melvyn Novas  has followed her for frequent pulmonary infections with diagnosis of obstructive bronchiectasis and asthma.  Her BMI is 21.45. 9) Tremor evaluation , had it for years, seen by R.Tat, DO- she confirmed is was not PD, but essential tremor.  10) asthma all my life. I  reviewed her medication and her recent lab results which included TSH of 2.73, a vit D  level of 55, HbA1c at 5.8, total cholesterol 241.   Sleep habits are as follows: Dinner time at 6 PM, bedtime is 10 PM. The bedroom is cool, quiet and dark- no TV in the bedroom.  She has preferred reading in bed, she sleeps supine since using CPAP, on two pillows. Most nights asleep within 30 minutes. She has one nocturia , and goes back to sleep- while not using CPAP recently. No gasping, no palpitation, no chest pains.  She rises at 7 AM , feeling now refreshed and better than when she used CPAP. ( Recovering from bronchitis, rhinitis, and asthma. She was given Rituximab - immune suppressed).  Rarely napping in daytime. If  Taking a nap it will last 1 hour.    Family sleep history: no family members with sleep apnea.   Social history: married, childless, husband has health problems, too. No pets in the home.  She drinks coffee 2 cups a day, iced tea rarely- decaff only, and sodas - none.  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    Review of Systems: Out of a complete 14 system review, the patient complains of only the following symptoms, and all other reviewed systems are negative.   The patient reports that she sometimes has insomnia but it is usually when she is under stress or something worries her a lot, she knows that she is snoring but she has not complained of snoring herself awake or feeling that snoring impairs  her quality of sleep.   She has high cholesterol but had no vascular or arterial disease is related to it.   She had lymphoma was diagnosed 5 years ago and is in remission.   Epworth score 5/ 24 - remains low  ,  Fatigue severity score 18/ 63   ,  depression score 5/15 , high anxiety, tremor.    Social History   Socioeconomic History  . Marital status: Married    Spouse name: Not on file  . Number of children: 0  . Years of education: 87  . Highest education level: Not on file  Occupational History  . Occupation: retired from office work  Tobacco Use  . Smoking status: Never Smoker  . Smokeless tobacco: Never Used  . Tobacco comment: Never Used Tobacco  Vaping Use  . Vaping Use: Never used  Substance and Sexual Activity  . Alcohol use: No    Alcohol/week: 0.0 standard drinks  . Drug use: No  . Sexual activity: Not on file  Other Topics Concern  . Not on file  Social History Narrative   Patient lives with her husband in a two story home.  Has no children.  Retired from office work.  Education: some college.    Social Determinants of Health   Financial Resource Strain:   . Difficulty of Paying Living Expenses: Not on file  Food Insecurity:   . Worried About Charity fundraiser in the Last Year: Not on file  . Ran Out of Food in the Last Year: Not on file  Transportation Needs:   . Lack of Transportation (Medical): Not on file  . Lack of Transportation (Non-Medical): Not on file  Physical Activity:   . Days of Exercise per Week: Not on file  . Minutes of Exercise per Session: Not on file  Stress:   . Feeling of Stress : Not on file  Social Connections:   . Frequency of Communication with Friends and Family: Not on file  . Frequency of Social Gatherings with Friends and Family: Not on file  . Attends Religious Services: Not on file  . Active Member of Clubs or Organizations: Not on file  . Attends Archivist Meetings: Not on file  . Marital Status: Not on  file  Intimate Partner Violence:   . Fear of Current or Ex-Partner: Not on file  . Emotionally Abused: Not on file  . Physically Abused: Not on file  . Sexually Abused: Not on file    Family History  Problem Relation Age of Onset  . Emphysema Father   . Heart disease Father   . Heart attack Father 39       Died  . Heart disease Brother 57  . Rheum arthritis Sister   . Hypertension Mother   . Thyroid disease Mother   . Colon cancer Neg Hx   . Stomach cancer Neg Hx     Past Medical History:  Diagnosis Date  . Adenomatous colon polyp 1994  . Asthma   . Cataract 2013   bilateral eyes  . Diverticulosis   . Family history of ischemic heart disease   . Fibrocystic breast disease   . GERD (gastroesophageal reflux disease)   . Hiatal hernia   . Hyperlipidemia   . Hypertension    pt denies - 01/16/18  . IBS (irritable bowel syndrome)   . Internal hemorrhoids   . Mantle cell lymphoma ([place]) 10/18/2014  . Basal skin cancer     Facial, pt denies on 01/16/18  . OSA (obstructive sleep apnea)   . Osteoarthritis   . Osteoporosis   . Prediabetes   . Unspecified hypothyroidism   . Vitamin D deficiency     Past Surgical History:  Procedure Laterality Date  . APPENDECTOMY    . BUNIONECTOMY    . CATARACT EXTRACTION, BILATERAL    . IR REMOVAL TUN ACCESS W/ PORT W/O FL MOD SED  03/04/2020  . KNEE ARTHROSCOPY    . TONSILLECTOMY    . UMBILICAL HERNIA REPAIR    . VIDEO BRONCHOSCOPY Bilateral 10/12/2017   Procedure: VIDEO BRONCHOSCOPY WITHOUT FLUORO;  Surgeon: Tanda Rockers, MD;  Location: [facility] END[facility];  Service: Endoscopy;  Laterality: Bilateral;    Current Outpatient Medications  Medication Sig Dispense Refill  . acidophilus (RISAQUAD) CAPS capsule Take 1 capsule by mouth 3 (three) times a week.    [name] amoxicillin (AMOXIL) 500 MG capsule TAKE 4 CAPSULES BY MOUTH ONE HOUR BEFORE DENTAL PROCEDURE. (Patient taking differently: Take 2,000 mg by mouth See admin instructions. Take 2000 mg by  mouth one hour before dental procedure) 8 capsule 0  . Cholecalciferol (VITAMIN D3) 50 MCG (2000 UT) capsule Take 4,000 Units by mouth daily.     . diazepam (VALIUM) 5 MG tablet Take 1/2 to 1 tablet     3 x /day      for Tremor 270 tablet 0  . hyoscyamine (LEVSIN SL) 0.125 MG SL tablet Take    1 to 2 tablets     every 4 hours       as needed for Nausea, Cramping, Bloating or Diarrhea DIARRHEA 90 tablet 0  . levothyroxine (SYNTHROID) 50 MCG tablet Take 1 tablet daily on an empty stomach with only water for 30 minutes & no Antacid meds, Calcium or Magnesium for 4 hours & avoid Biotin (Patient taking differently: Take 25 mcg by mouth See  admin instructions. ) 90 tablet 3  . PROAIR HFA 108 (90 Base) MCG/ACT inhaler INHALE 2 PUFFS EVERY 4 HOURS AS NEEDED INTO THE LUNGS FOR WHEEZING OR SHORTNESS OF BREATH. (Patient taking differently: Inhale 2 puffs into the lungs every 4 (four) hours as needed for wheezing or shortness of breath. ) 48 g 1  . Respiratory Therapy Supplies (FLUTTER) DEVI Take as directed 1 each 0  . rosuvastatin (CRESTOR) 10 MG tablet 1/4 tablet every day (Patient taking differently: Take 2.5 mg by mouth daily. ) 30 tablet 3   No current facility-administered medications for this visit.   Facility-Administered Medications Ordered in Other Visits  Medication Dose Route Frequency Provider Last Rate Last Admin  . ipratropium-albuterol (DUONEB) 0.5-2.5 (3) MG/3ML nebulizer solution 3 mL  3 mL Nebulization Q6H [place], Holli Humbles, NP        Allergies as of 07/01/2020 - Review Complete 07/01/2020  Allergen Reaction Noted  . Alendronate Other (See Comments) 09/15/2013  . Risedronate sodium Other (See Comments) 09/15/2013  . Sulfa antibiotics Other (See Comments) 09/15/2013  . Sulfasalazine Other (See Comments) 09/15/2013  . Other Rash 03/16/2011  . Tetracyclines & related Rash 03/16/2011    Vitals: BP 132/78   Pulse (!) 42   Ht 5' 3.5" (1.613 m)   Wt 114 lb (51.7 kg)   LMP 09/14/2014    BMI 19.88 kg/m  Last Weight:  Wt Readings from Last 1 Encounters:  07/01/20 114 lb (51.7 kg)   VWP:VXYI mass index is 19.88 kg/m.     Last Height:   Ht Readings from Last 1 Encounters:  07/01/20 5' 3.5" (1.613 m)    Physical exam:  General: The patient is awake, alert and appears not in acute distress. The patient is well groomed. She appears nervous.  Head: Normocephalic, atraumatic. Titubation noted. Neck is supple. Mallampati 3, uvula deviated to the right.  neck circumference:14. 75. Nasal airflow patent - just recovering form congestion. ,  Retrognathia is seen.  Cardiovascular:  Regular rate and rhythm, without  murmurs or carotid bruit, and without distended neck veins. Respiratory: Lungs are clear to auscultation. Skin:  Without evidence of edema, or rash Trunk: BMI is 19.88 kg/m2. The patient's posture is erect - very erect.   Neurologic exam :The patient is awake and alert, oriented to place and time.   Memory subjective  described as intact.  Attention span & concentration ability appears normal.  Speech is fluent,  without  dysarthria, mild dysphonia , no aphasia.  Mood and affect are defensive, slightly aggressive.   Cranial nerves: Pupils are equal and briskly reactive to light. Funduscopic exam deferred. appears to have dry eyes, lower eye lids reddened . [name] Extraocular movements  in vertical and horizontal planes intact and without nystagmus.Hearing to finger rub intact. Facial sensation intact to fine touch. Facial motor strength is symmetric with a left sided mild ptosis.   Her tongue and uvula are without tremor- tongue is  midline. Uvula is slightly deviated to the right.  Shoulder shrug affected by cervical dystonia.  Motor exam:  Normal tone, muscle bulk and symmetric strength in all extremities. Deep tendon reflexes: in the  upper and lower extremities are symmetric and intact. Babinski maneuver deferred. .  Assessment:    Shannon Parsons is a pleasant  80 year old Caucasian right-handed female patient with a history of non- parkinsonian  tremor, a diagnosis of cervical dystonia, lymphoma in remission, lifelong asthmatic, but she developed bronchiectasis and restrictive airway disease to a much  greater degree likely associated with chemotherapy the treatment of lymphoma.  She has been diagnosed with a mild sleep apnea before the lymphoma was of unknown and for years CPAP did her well but now she feels that CPAP actually contributes to recurrent airway infections.   She was restested for apnea and the AHI was high, severe, non REM dependent , she uses an auto titration CPAP and the 95% pressure is at 10.3 cm water and a high air leak while using a nasal bella swift with ear loops.  No  So-clean machine - ozone machines have shown to break down isolation foam in CPAP machines.    Plan:  Treatment plan and additional workup : HST per patient's request.  Insomnia is likely unrelated to neurological or organic problems, I will not provide sonata 5 mg. She failed xanax and diazepam, and trazodone with PCP.  I also will ask DME to help with avoiding air leaks further - she will ned a personal visit.      Larey Seat, MD 92/92/4462, 86:38 AM  Certified in Neurology by ABPN Certified in [place] by [facility] [address] [place], [place] [address]

## 2020-07-02 DIAGNOSIS — R159 Full incontinence of feces: Secondary | ICD-10-CM | POA: Diagnosis not present

## 2020-07-03 DIAGNOSIS — H02051 Trichiasis without entropian right upper eyelid: Secondary | ICD-10-CM | POA: Diagnosis not present

## 2020-07-03 DIAGNOSIS — H04123 Dry eye syndrome of bilateral lacrimal glands: Secondary | ICD-10-CM | POA: Diagnosis not present

## 2020-07-22 ENCOUNTER — Other Ambulatory Visit: Payer: Self-pay | Admitting: Internal Medicine

## 2020-07-22 DIAGNOSIS — E782 Mixed hyperlipidemia: Secondary | ICD-10-CM

## 2020-07-22 MED ORDER — ROSUVASTATIN CALCIUM 5 MG PO TABS: ORAL_TABLET | ORAL | 0 refills | Status: DC

## 2020-07-29 DIAGNOSIS — G4733 Obstructive sleep apnea (adult) (pediatric): Secondary | ICD-10-CM | POA: Diagnosis not present

## 2020-07-30 ENCOUNTER — Other Ambulatory Visit: Payer: Self-pay

## 2020-07-30 ENCOUNTER — Telehealth: Payer: Self-pay

## 2020-07-30 ENCOUNTER — Inpatient Hospital Stay: Payer: PPO | Attending: Hematology & Oncology

## 2020-07-30 ENCOUNTER — Inpatient Hospital Stay (HOSPITAL_BASED_OUTPATIENT_CLINIC_OR_DEPARTMENT_OTHER): Payer: PPO | Admitting: Hematology & Oncology

## 2020-07-30 ENCOUNTER — Encounter: Payer: Self-pay | Admitting: Hematology & Oncology

## 2020-07-30 VITALS — BP 161/65 | HR 66 | Temp 98.8°F | Resp 16 | Wt 116.0 lb

## 2020-07-30 DIAGNOSIS — Z8572 Personal history of non-Hodgkin lymphomas: Secondary | ICD-10-CM | POA: Insufficient documentation

## 2020-07-30 DIAGNOSIS — C8313 Mantle cell lymphoma, intra-abdominal lymph nodes: Secondary | ICD-10-CM

## 2020-07-30 DIAGNOSIS — D801 Nonfamilial hypogammaglobulinemia: Secondary | ICD-10-CM | POA: Diagnosis not present

## 2020-07-30 DIAGNOSIS — Z9221 Personal history of antineoplastic chemotherapy: Secondary | ICD-10-CM | POA: Diagnosis not present

## 2020-07-30 DIAGNOSIS — J470 Bronchiectasis with acute lower respiratory infection: Secondary | ICD-10-CM | POA: Diagnosis not present

## 2020-07-30 DIAGNOSIS — Z79899 Other long term (current) drug therapy: Secondary | ICD-10-CM | POA: Insufficient documentation

## 2020-07-30 LAB — CBC WITH DIFFERENTIAL (CANCER CENTER ONLY)
Abs Immature Granulocytes: 0.02 10*3/uL (ref 0.00–0.07)
Basophils Absolute: 0.1 10*3/uL (ref 0.0–0.1)
Basophils Relative: 1 %
Eosinophils Absolute: 0.1 10*3/uL (ref 0.0–0.5)
Eosinophils Relative: 2 %
HCT: 45.2 % (ref 36.0–46.0)
Hemoglobin: 14.3 g/dL (ref 12.0–15.0)
Immature Granulocytes: 0 %
Lymphocytes Relative: 23 %
Lymphs Abs: 1.8 10*3/uL (ref 0.7–4.0)
MCH: 29.2 pg (ref 26.0–34.0)
MCHC: 31.6 g/dL (ref 30.0–36.0)
MCV: 92.2 fL (ref 80.0–100.0)
Monocytes Absolute: 0.9 10*3/uL (ref 0.1–1.0)
Monocytes Relative: 12 %
Neutro Abs: 4.8 10*3/uL (ref 1.7–7.7)
Neutrophils Relative %: 62 %
Platelet Count: 216 10*3/uL (ref 150–400)
RBC: 4.9 MIL/uL (ref 3.87–5.11)
RDW: 13.3 % (ref 11.5–15.5)
WBC Count: 7.8 10*3/uL (ref 4.0–10.5)
nRBC: 0 % (ref 0.0–0.2)

## 2020-07-30 LAB — CMP (CANCER CENTER ONLY)
ALT: 13 U/L (ref 0–44)
AST: 26 U/L (ref 15–41)
Albumin: 4.4 g/dL (ref 3.5–5.0)
Alkaline Phosphatase: 57 U/L (ref 38–126)
Anion gap: 9 (ref 5–15)
BUN: 18 mg/dL (ref 8–23)
CO2: 28 mmol/L (ref 22–32)
Calcium: 10.3 mg/dL (ref 8.9–10.3)
Chloride: 101 mmol/L (ref 98–111)
Creatinine: 1.01 mg/dL — ABNORMAL HIGH (ref 0.44–1.00)
GFR, Estimated: 58 mL/min — ABNORMAL LOW (ref >60)
Glucose, Bld: 102 mg/dL — ABNORMAL HIGH (ref 70–99)
Potassium: 4.8 mmol/L (ref 3.5–5.1)
Sodium: 138 mmol/L (ref 135–145)
Total Bilirubin: 0.4 mg/dL (ref 0.3–1.2)
Total Protein: 6.7 g/dL (ref 6.5–8.1)

## 2020-07-30 LAB — LACTATE DEHYDROGENASE: LDH: 227 U/L — ABNORMAL HIGH (ref 98–192)

## 2020-07-30 NOTE — Progress Notes (Signed)
Hematology and Oncology Follow Up Visit  Shannon Parsons [id number] 1941/05/10 79 y.o. 07/30/2020   Principle Diagnosis:   Mantle cell lymphoma (MCIPI = 5)  Thromboembolic event of the right leg  Acquired Hypogammaglobulinemia  Bronchiectasis  Current Therapy:    Rituxan - maintanence - s/p cycle #10 - to finish in 07/2017  IVIG - q  month -restart on 10/27/2017; last dose 01/05/2018     Interim History:    Shannon Parsons now comes in for follow-up.  Overall, she seems to be doing pretty well.  Her Port-A-Cath is now taken out.  She is happy about this.  She has had absolutely  no problems with respect to mantle cell lymphoma.  She has had no evidence of recurrence today.  I would like to think that she is not going to have any issues with respect to recurrent disease at this point.  She has had no problems with fever.  She has had her coronavirus vaccines.  Her husband still is having his problems with dementia.  It sounds like he continues to decline.  Her appetite is good.  She has had no nausea or vomiting.  She has had a little bit of rectal problems.  I think she was to undergo a rectal stricture expansion.  She decided not to have this done.  She will have a nice Thanksgiving with her family.  Overall, her performance status is ECOG 1.    Medications:  Current Outpatient Medications:  .  acidophilus (RISAQUAD) CAPS capsule, Take 1 capsule by mouth daily. , Disp: , Rfl:  .  Cholecalciferol (VITAMIN D3) 50 MCG (2000 UT) capsule, Take 4,000 Units by mouth daily. , Disp: , Rfl:  .  diazepam (VALIUM) 5 MG tablet, Take 1/2 to 1 tablet     3 x /day      for Tremor, Disp: 270 tablet, Rfl: 0 .  hyoscyamine (LEVSIN SL) 0.125 MG SL tablet, Take    1 to 2 tablets     every 4 hours       as needed for Nausea, Cramping, Bloating or Diarrhea DIARRHEA, Disp: 90 tablet, Rfl: 0 .  levothyroxine (SYNTHROID) 50 MCG tablet, Take 1 tablet daily on an empty stomach with only water for 30  minutes & no Antacid meds, Calcium or Magnesium for 4 hours & avoid Biotin (Patient taking differently: Take 25 mcg by mouth See admin instructions. ), Disp: 90 tablet, Rfl: 3 .  LORazepam (ATIVAN) 0.5 MG tablet, lorazepam 0.5 mg tablet  TAKE 1 TABLET BY MOUTH EVERY 8 HOURS AS NEEDED FOR SLEEP (NAUSEA OR VOMITING)., Disp: , Rfl:  .  rosuvastatin (CRESTOR) 5 MG tablet, Take      1/2 tablet (2.5 mg)       Daily       for Cholesterol (Patient taking differently: 07/30/2020 Takes 1/4 tablet  Daily for Cholesterol.), Disp: 46 tablet, Rfl: 0 .  amoxicillin (AMOXIL) 500 MG capsule, TAKE 4 CAPSULES BY MOUTH ONE HOUR BEFORE DENTAL PROCEDURE. (Patient not taking: Reported on 07/30/2020), Disp: 8 capsule, Rfl: 0 .  PROAIR HFA 108 (90 Base) MCG/ACT inhaler, INHALE 2 PUFFS EVERY 4 HOURS AS NEEDED INTO THE LUNGS FOR WHEEZING OR SHORTNESS OF BREATH. (Patient not taking: Reported on 07/30/2020), Disp: 48 g, Rfl: 1 .  Respiratory Therapy Supplies (FLUTTER) DEVI, Take as directed (Patient not taking: Reported on 07/30/2020), Disp: 1 each, Rfl: 0 .  zaleplon (SONATA) 5 MG capsule, Take 1 capsule (5 mg total) by mouth  at bedtime as needed for sleep. (Patient not taking: Reported on 07/30/2020), Disp: 30 capsule, Rfl: 3 No current facility-administered medications for this visit.  Facility-Administered Medications Ordered in Other Visits:  .  ipratropium-albuterol (DUONEB) 0.5-2.5 (3) MG/3ML nebulizer solution 3 mL, 3 mL, Nebulization, Q6H, [place], Holli Humbles, NP  Allergies:  Allergies  Allergen Reactions  . Alendronate Other (See Comments)    "made me choke"    . Risedronate Sodium Other (See Comments)    "made me choke"    . Sulfa Antibiotics Other (See Comments)    Doesn't remember   . Sulfasalazine Other (See Comments)    Doesn't remember   . Other Rash  . Tetracyclines & Related Rash    Past Medical History, Surgical history, Social history, and Family History were reviewed and updated.  Review of  Systems: Review of Systems  Constitutional: Positive for malaise/fatigue.  HENT: Positive for congestion.   Eyes: Negative.   Respiratory: Positive for shortness of breath and wheezing.   Cardiovascular: Negative.   Gastrointestinal: Negative.   Genitourinary: Negative.   Musculoskeletal: Negative.   Skin: Negative.   Neurological: Negative.   Endo/Heme/Allergies: Negative.   Psychiatric/Behavioral: Negative.     Physical Exam:  weight is 116 lb (52.6 kg). Her oral temperature is 98.8 F (37.1 C). Her blood pressure is 161/65 (abnormal) and her pulse is 66. Her respiration is 16 and oxygen saturation is 100%.   Physical Exam Vitals reviewed.  HENT:     Head: Normocephalic and atraumatic.  Eyes:     Pupils: Pupils are equal, round, and reactive to light.  Cardiovascular:     Rate and Rhythm: Normal rate and regular rhythm.     Heart sounds: Normal heart sounds.  Pulmonary:     Effort: Pulmonary effort is normal.     Breath sounds: Normal breath sounds.  Abdominal:     General: Bowel sounds are normal.     Palpations: Abdomen is soft.  Musculoskeletal:        General: No tenderness or deformity. Normal range of motion.     Cervical back: Normal range of motion.  Lymphadenopathy:     Cervical: No cervical adenopathy.  Skin:    General: Skin is warm and dry.     Findings: No erythema or rash.  Neurological:     Mental Status: She is alert and oriented to person, place, and time.  Psychiatric:        Behavior: Behavior normal.        Thought Content: Thought content normal.        Judgment: Judgment normal.      Lab Results  Component Value Date   WBC 7.8 07/30/2020   HGB 14.3 07/30/2020   HCT 45.2 07/30/2020   MCV 92.2 07/30/2020   PLT 216 07/30/2020     Chemistry      Component Value Date/Time   NA 138 07/30/2020 1147   NA 141 07/13/2017 0743   NA 138 08/01/2015 1124   K 4.8 07/30/2020 1147   K 4.2 07/13/2017 0743   K 4.0 08/01/2015 1124   CL 101  07/30/2020 1147   CL 105 07/13/2017 0743   CO2 28 07/30/2020 1147   CO2 27 07/13/2017 0743   CO2 28 08/01/2015 1124   BUN 18 07/30/2020 1147   BUN 16 07/13/2017 0743   BUN 10.4 08/01/2015 1124   CREATININE 1.01 (H) 07/30/2020 1147   CREATININE 0.93 05/06/2020 1135   CREATININE 0.8 08/01/2015 1124  Component Value Date/Time   CALCIUM 10.3 07/30/2020 1147   CALCIUM 9.5 07/13/2017 0743   CALCIUM 9.6 08/01/2015 1124   ALKPHOS 57 07/30/2020 1147   ALKPHOS 68 07/13/2017 0743   ALKPHOS 88 08/01/2015 1124   AST 26 07/30/2020 1147   AST 23 08/01/2015 1124   ALT 13 07/30/2020 1147   ALT 19 07/13/2017 0743   ALT 12 08/01/2015 1124   BILITOT 0.4 07/30/2020 1147   BILITOT <0.30 08/01/2015 1124      Impression and Plan: Ms. Thorington is 79 year old white female. She has mantle cell lymphoma. Again, by the [facility] she is an intermediate risk.  She received 2 different courses of chemotherapy. She could not tolerate R-CHOP with Velcade substituting for vincristine. This however was incredibly helpful. We finished up with Rituxan/bendamustine. She completed this in August 2016.  We then started maintenance Rituxan. She started this in November 2016.  She completed this in November 2018.    I do not see any indication for scans.  Her exam is unremarkable.  Her lab work looks Chief Technology Officer.  We will plan to see her back in 6 months.  Volanda Napoleon, MD 11/17/202112:53 PM

## 2020-07-30 NOTE — Telephone Encounter (Signed)
appts made and printed for pt   Per 11/17 los   AOM

## 2020-07-30 NOTE — Telephone Encounter (Signed)
appts made and printed for pt per 07/30/20 los/// AOM

## 2020-08-06 ENCOUNTER — Other Ambulatory Visit (HOSPITAL_COMMUNITY): Payer: Self-pay | Admitting: Internal Medicine

## 2020-08-06 ENCOUNTER — Ambulatory Visit: Payer: PPO | Attending: Internal Medicine

## 2020-08-06 DIAGNOSIS — Z23 Encounter for immunization: Secondary | ICD-10-CM

## 2020-08-06 NOTE — Progress Notes (Signed)
   Covid-19 Vaccination Clinic  Name:  Shannon Parsons    MRN: [id number] DOB: [birth year]  08/06/2020  Shannon Parsons was observed post Covid-19 immunization for 15 minutes without incident. She was provided with Vaccine Information Sheet and instruction to access the V-Safe system.   Shannon Parsons was instructed to call 911 with any severe reactions post vaccine: [name] Difficulty breathing  . Swelling of face and throat  . A fast heartbeat  . A bad rash all over body  . Dizziness and weakness   Immunizations Administered    Name Date Dose VIS Date Route   Pfizer COVID-19 Vaccine 08/06/2020  9:59 AM 0.3 mL 07/02/2020 Intramuscular   Manufacturer: [place]   Lot: [id number]   NDC: [address]-5

## 2020-08-08 ENCOUNTER — Other Ambulatory Visit (HOSPITAL_COMMUNITY): Payer: PPO

## 2020-08-11 ENCOUNTER — Encounter (HOSPITAL_COMMUNITY): Payer: Self-pay

## 2020-08-11 ENCOUNTER — Ambulatory Visit (HOSPITAL_COMMUNITY): Admit: 2020-08-11 | Payer: PPO | Admitting: Surgery

## 2020-08-11 SURGERY — MANOMETRY, ANORECTAL

## 2020-08-26 ENCOUNTER — Ambulatory Visit: Payer: PPO

## 2020-08-28 DIAGNOSIS — G4733 Obstructive sleep apnea (adult) (pediatric): Secondary | ICD-10-CM | POA: Diagnosis not present

## 2020-09-11 DIAGNOSIS — J449 Chronic obstructive pulmonary disease, unspecified: Secondary | ICD-10-CM | POA: Diagnosis not present

## 2020-09-11 DIAGNOSIS — G4733 Obstructive sleep apnea (adult) (pediatric): Secondary | ICD-10-CM | POA: Diagnosis not present

## 2020-09-11 DIAGNOSIS — J471 Bronchiectasis with (acute) exacerbation: Secondary | ICD-10-CM | POA: Diagnosis not present

## 2020-09-28 DIAGNOSIS — G4733 Obstructive sleep apnea (adult) (pediatric): Secondary | ICD-10-CM | POA: Diagnosis not present

## 2020-10-29 DIAGNOSIS — G4733 Obstructive sleep apnea (adult) (pediatric): Secondary | ICD-10-CM | POA: Diagnosis not present

## 2020-11-09 ENCOUNTER — Encounter: Payer: Self-pay | Admitting: Internal Medicine

## 2020-11-09 MED ORDER — ROSUVASTATIN CALCIUM 5 MG PO TABS: ORAL_TABLET | ORAL | 0 refills | Status: DC

## 2020-11-09 NOTE — Progress Notes (Signed)
Annual Screening/Preventative Visit & Comprehensive Evaluation &  Examination      This very nice 80 y.o.  MWF presents for a Screening /Preventative Visit & comprehensive evaluation and management of multiple medical co-morbidities.  Patient has been followed for labile HTN, HLD, Hypothyroidism, Prediabetes, Essential Hereditary Tremor and Vitamin D Deficiency. Patient is on CPAP for OSA & is followed by Dr Brett Fairy. Patient is followed by Dr Melvyn Novas for Obstructive bronchiectasis. Patient also has Osteoporosis & has repeatedly declined any treatments as recommended by Dr Marin Olp and myself.  Patient has Aortic Atherosclerosis by PET scan on 04/21/2018. Patient relates frustrations dealing with her husband's memory short comings and uses her diazepam as much for her frustration /anxiety as for her tremor.       Patient is followed by Dr Marin Olp for hx/o  [place] predating from Jan  2016 and has completed Chemotherapy by Nov 2018. She continues 6 month f/u's.   In Apr 2016 she was dx'd/tx'd for a Rt Leg DVT with Xarelto for 6 months.       Labile HTN predates many years & is followed expectantly. Patient's BP has been controlled at home and patient denies any cardiac symptoms as chest pain, palpitations, shortness of breath, dizziness or ankle swelling. Today's BP is at goal - 126/84.      Patient's hyperlipidemia is now controlled with diet and Rosuvastatin. Patient denies myalgias or other medication SE's. Last lipids were at goal:  Lab Results  Component Value Date   CHOL 174 05/06/2020   HDL 65 05/06/2020   LDLCALC 86 05/06/2020   TRIG 125 05/06/2020   CHOLHDL 2.7 05/06/2020       Patient has hx/o prediabetes (A1c 5.9%/2012) and patient denies reactive hypoglycemic symptoms, visual blurring, diabetic polys or paresthesias. With a 20# weight loss her A1c's normalized & her last A1c was normal & at goal:  Lab Results  Component Value Date   HGBA1C 5.5 05/06/2020        Patient was dx'd Hypothyroid in Oct 2012 and initiated on thyroid replacement.      Finally, patient has history of Vitamin D Deficiency and last Vitamin D was slightly low (Goal 70-100):  Lab Results  Component Value Date   VD25OH 50 05/06/2020    Current Outpatient Medications on File Prior to Visit  Medication Sig  . acidophilus (RISAQUAD) CAPS  Take 1 capsule daily.   [name] VITAMIN D 2,000 u Take 4,000 Units  daily.   . diazepam  5 MG tablet Take 1/2 to 1 tablet 3 x /day for Tremor  . hyoscyamine  SL 0.125 MG SL Take1 to 2 tablets every 4 hours as needed   . levothyroxine 50 MCG tab Take 1/2  tablet daily    Rosuvastatin 5 mg  Takes 1/4 tablet Daily   . LORazepam 0.5 MG tablet Take 1 tablet every 8 hours  As needed      Allergies  Allergen Reactions  . Alendronate "made me choke"  . Risedronate Sodium "made me choke"   . Sulfa Antibiotics Doesn't remember   . Sulfasalazine Doesn't remember   . Tetracyclines & Related Rash    Past Medical History:  Diagnosis Date  . Adenomatous colon polyp 1994  . Asthma   . Cataract 2013   bilateral eyes  . Diverticulosis   . Family history of ischemic heart disease   . Fibrocystic breast disease   . GERD (gastroesophageal reflux disease)   . Hiatal hernia   .  Hyperlipidemia   . Hypertension    pt denies - 01/16/18  . IBS (irritable bowel syndrome)   . Internal hemorrhoids   . Mantle cell lymphoma ([place]) 10/18/2014  . Melanoma ([place])    Facial, pt denies on 01/16/18  . OSA (obstructive sleep apnea)   . Osteoarthritis   . Osteoporosis   . Prediabetes   . hypothyroidism   . Vitamin D deficiency    Health Maintenance  Topic Date Due  . TETANUS/TDAP  Never done  . COVID-19 Vaccine (4 - Booster for [place] series) 02/03/2021  . COLONOSCOPY (Pts 45-16yrs Insurance coverage will need to be confirmed)  02/02/2022  . DEXA SCAN  Completed  . Hepatitis C Screening  Completed  . PNA vac Low Risk Adult  Completed  . INFLUENZA  VACCINE  Discontinued   Immunization History  Administered Date(s) Administered  . PFIZER(Purple Top)SARS-COV-2 Vaccination 10/29/2019, 11/19/2019, 08/06/2020  . Pneumococcal Conjugate-13 03/18/2016  . Pneumococcal Polysaccharide-23 06/18/2013  . Zoster 04/17/2013   Last Colon - 02/02/2017 - Dr Fuller Plan - Recc 5 yr f/u due May 2023  Last MGM - 04/02/2019   Past Surgical History:  Procedure Laterality Date  . APPENDECTOMY    . BUNIONECTOMY    . CATARACT EXTRACTION, BILATERAL    . IR REMOVAL TUN ACCESS W/ PORT W/O FL MOD SED  03/04/2020  . KNEE ARTHROSCOPY    . TONSILLECTOMY    . UMBILICAL HERNIA REPAIR    . VIDEO BRONCHOSCOPY Bilateral 10/12/2017   Procedure: VIDEO BRONCHOSCOPY WITHOUT FLUORO;  Surgeon: Tanda Rockers, MD;  Location: [facility] END[facility]COPY;  Service: Endoscopy;  Laterality: Bilateral;   Family History  Problem Relation Age of Onset  . Emphysema Father   . Heart disease Father   . Heart attack Father 66       Died  . Heart disease Brother 54  . Rheum arthritis Sister   . Hypertension Mother   . Thyroid disease Mother   . Colon cancer Neg Hx   . Stomach cancer Neg Hx    Social History   Tobacco Use  . Smoking status: Never Smoker  . Smokeless tobacco: Never Used  . Tobacco comment: Never Used Tobacco  Vaping Use  . Vaping Use: Never used  Substance Use Topics  . Alcohol use: No    Alcohol/week: 0.0 standard drinks  . Drug use: No    ROS Constitutional: Denies fever, chills, weight loss/gain, headaches, insomnia,  night sweats, and change in appetite. Does c/o fatigue. Eyes: Denies redness, blurred vision, diplopia, discharge, itchy, watery eyes.  ENT: Denies discharge, congestion, post nasal drip, epistaxis, sore throat, earache, hearing loss, dental pain, Tinnitus, Vertigo, Sinus pain, snoring.  Cardio: Denies chest pain, palpitations, irregular heartbeat, syncope, dyspnea, diaphoresis, orthopnea, PND, claudication, edema Respiratory: denies cough, dyspnea,  DOE, pleurisy, hoarseness, laryngitis, wheezing.  Gastrointestinal: Denies dysphagia, heartburn, reflux, water brash, pain, cramps, nausea, vomiting, bloating, diarrhea, constipation, hematemesis, melena, hematochezia, jaundice, hemorrhoids Genitourinary: Denies dysuria, frequency, urgency, nocturia, hesitancy, discharge, hematuria, flank pain Breast: Breast lumps, nipple discharge, bleeding.  Musculoskeletal: Denies arthralgia, myalgia, stiffness, Jt. Swelling, pain, limp, and strain/sprain. Denies falls. Skin: Denies puritis, rash, hives, warts, acne, eczema, changing in skin lesion Neuro: No weakness, tremor, incoordination, spasms, paresthesia, pain Psychiatric: Denies confusion, memory loss, sensory loss. Denies Depression. Endocrine: Denies change in weight, skin, hair change, nocturia, and paresthesia, diabetic polys, visual blurring, hyper / hypo glycemic episodes.  Heme/Lymph: No excessive bleeding, bruising, enlarged lymph nodes.  Physical Exam  BP 126/84  Pulse 71   Temp (!) 96.3 F (35.7 C)   Resp 16   Ht 5\' 4"  (1.626 m)   Wt 117 lb (53.1 kg)   LMP 09/14/2014   SpO2 96%   BMI 20.08 kg/m   General Appearance: Well nourished, well groomed and in no apparent distress. Prominent head titubation.  Eyes: PERRLA, EOMs, conjunctiva no swelling or erythema, normal fundi and vessels. Sinuses: No frontal/maxillary tenderness ENT/Mouth: EACs patent / TMs  nl. Nares clear without erythema, swelling, mucoid exudates. Oral hygiene is good. No erythema, swelling, or exudate. Tongue normal, non-obstructing. Tonsils not swollen or erythematous. Hearing normal.  Neck: Supple, thyroid not palpable. No bruits, nodes or JVD. Respiratory: Respiratory effort normal.  BS equal and clear bilateral without rales, rhonci, wheezing or stridor. Cardio: Heart sounds are normal with regular rate and rhythm and no murmurs, rubs or gallops. Peripheral pulses are normal and equal bilaterally without edema.  No aortic or femoral bruits. Chest: symmetric with normal excursions and percussion. Breasts: Symmetric, without lumps, nipple discharge, retractions, or fibrocystic changes.  Abdomen: Flat, soft with bowel sounds active. Nontender, no guarding, rebound, hernias, masses, or organomegaly.  Lymphatics: Non tender without lymphadenopathy.  Musculoskeletal: Full ROM all peripheral extremities, joint stability, 5/5 strength, and normal gait. Skin: Warm and dry without rashes, lesions, cyanosis, clubbing or  ecchymosis.  Neuro: Cranial nerves intact, reflexes equal bilaterally. Normal muscle tone, no cerebellar symptoms. Sensation intact. Mild high frequenc., low amplitude action tremor of the hands. Pysch: Alert and oriented X 3, normal affect, Insight and Judgment appropriate.   Assessment and Plan  1. Annual Preventative Screening Examination   2. Labile hypertension  - EKG 12-Lead - Urinalysis, Routine w reflex microscopic - Microalbumin / creatinine urine ratio - CBC with Differential/Platelet - COMPLETE METABOLIC PANEL WITH GFR - Magnesium - TSH  3. Hyperlipidemia, mixed  - EKG 12-Lead - Lipid panel - TSH  4. Abnormal glucose  - EKG 12-Lead - Hemoglobin A1c - Insulin, random  5. Vitamin D deficiency  - VITAMIN D 25 Hydroxy   6. Atherosclerosis of aorta ([place]) by PET Scan 04/21/2018.  - EKG 12-Lead - Lipid panel  7. OSA and COPD overlap syndrome ([place])   8. Hypothyroidism  - TSH  9. Chronic obstructive airway disease with asthma ([place])   10. Obstructive bronchiectasis ([place])   11. History of lymphoma   12. Hereditary essential tremor   13. Osteoporosis   14. Screening for colorectal cancer  - POC Hemoccult Bld/Stl   15. Screening for ischemic heart disease  - EKG 12-Lead  16. FHx: heart disease  - EKG 12-Lead  17. Medication management  - Urinalysis, Routine w reflex microscopic - Microalbumin / creatinine urine ratio - CBC with  Differential/Platelet - COMPLETE METABOLIC PANEL WITH GFR - Magnesium - Lipid panel - TSH - Hemoglobin A1c - Insulin, random - VITAMIN D 25 Hydroxy          Patient was counseled in prudent diet to achieve/maintain BMI less than 25 for weight control, BP monitoring, regular exercise and medications. Discussed med's effects and SE's. Screening labs and tests as requested with regular follow-up as recommended. Over 40 minutes of exam, counseling, chart review and high complex critical decision making was performed.   Kirtland Bouchard, MD

## 2020-11-09 NOTE — Patient Instructions (Signed)
Due to recent changes in healthcare laws, you may see the results of your imaging and laboratory studies on MyChart before your provider has had a chance to review them.  We understand that in some cases there may be results that are confusing or concerning to you. Not all laboratory results come back in the same time frame and the provider may be waiting for multiple results in order to interpret others.  Please give [place] 48 hours in order for your provider to thoroughly review all the results before contacting the office for clarification of your results.   +++++++++++++++++++++++++  Vit D  & Vit C 1,000 mg   are recommended to help protect  against the Covid-19 and other Corona viruses.    Also it's recommended  to take  Zinc 50 mg  to help  protect against the Covid-19   and best place to get  is also on [email or web address]  and don't pay more than 6-8 cents /pill !  ================================ Coronavirus (COVID-19) Are you at risk?  Are you at risk for the Coronavirus (COVID-19)?  To be considered HIGH RISK for Coronavirus (COVID-19), you have to meet the following criteria:  . Traveled to [place], [place], [place], [place] or [place]; or in the [place] to [place], [place], [place]  . or [place]; and have fever, cough, and shortness of breath within the last 2 weeks of travel OR . Been in close contact with a person diagnosed with COVID-19 within the last 2 weeks and have  . fever, cough,and shortness of breath .  . IF YOU DO NOT MEET THESE CRITERIA, YOU ARE CONSIDERED LOW RISK FOR COVID-19.  What to do if you are HIGH RISK for COVID-19?  [name] If you are having a medical emergency, call 911. . Seek medical care right away. Before you go to a doctor's office, urgent care or emergency department, .  call ahead and tell them about your recent travel, contact with someone diagnosed with COVID-19  .  and your symptoms.  . You should receive instructions from your physician's  office regarding next steps of care.  . When you arrive at healthcare provider, tell the healthcare staff immediately you have returned from  . visiting [place], [place], [place], [place] or [place]; or traveled in the [place] to [place], [place],  . [place] or [place] in the last two weeks or you have been in close contact with a person diagnosed with  . COVID-19 in the last 2 weeks.   . Tell the health care staff about your symptoms: fever, cough and shortness of breath. . After you have been seen by a medical provider, you will be either: o Tested for (COVID-19) and discharged home on quarantine except to seek medical care if  o symptoms worsen, and asked to  - Stay home and avoid contact with others until you get your results (4-5 days)  - Avoid travel on public transportation if possible (such as bus, train, or airplane) or o Sent to the Emergency Department by EMS for evaluation, COVID-19 testing  and  o possible admission depending on your condition and test results.  What to do if you are LOW RISK for COVID-19?  Reduce your risk of any infection by using the same precautions used for avoiding the common cold or flu:  [name] Wash your hands often with soap and warm water for at least 20 seconds.  If soap and water are not readily  available,  . use an alcohol-based hand sanitizer with at least 60% alcohol.  . If coughing or sneezing, cover your mouth and nose by coughing or sneezing into the elbow areas of your shirt or coat, .  into a tissue or into your sleeve (not your hands). . Avoid shaking hands with others and consider head nods or verbal greetings only. . Avoid touching your eyes, nose, or mouth with unwashed hands.  . Avoid close contact with people who are sick. . Avoid places or events with large numbers of people in one location, like concerts or sporting events. . Carefully consider travel plans you have or are making. . If you are planning any travel outside or  inside the [place], visit the CDC's Travelers' Health webpage for the latest health notices. . If you have some symptoms but not all symptoms, continue to monitor at home and seek medical attention  . if your symptoms worsen. . If you are having a medical emergency, call 911.   . >>>>>>>>>>>>>>>>>>>>>>>>>>>>>>>>> . We Do NOT Approve of  [facility], [employer]  Our Patients  To Do Home Visits & We Do NOT Approve of [employer] > > > > > > > > > > > > > > > > > > > > > > > > > > > > > > > > > > > > > > >  Preventive Care for Adults  A healthy lifestyle and preventive care can promote health and wellness. Preventive health guidelines for women include the following key practices.  A routine yearly physical is a good way to check with your health care provider about your health and preventive screening. It is a chance to share any concerns and updates on your health and to receive a thorough exam.  Visit your dentist for a routine exam and preventive care every 6 months. Brush your teeth twice a day and floss once a day. Good oral hygiene prevents tooth decay and gum disease.  The frequency of eye exams is based on your age, health, family medical history, use of contact lenses, and other factors. Follow your health care provider's recommendations for frequency of eye exams.  Eat a healthy diet. Foods like vegetables, fruits, whole grains, low-fat dairy products, and lean protein foods contain the nutrients you need without too many calories. Decrease your intake of foods high in solid fats, added sugars, and salt. Eat the right amount of calories for you. Get information about a proper diet from your health care provider, if necessary.  Regular physical exercise is one of the most important things you can do for your health. Most adults should get at least 150 minutes of moderate-intensity exercise (any activity that increases your heart rate and causes you to sweat) each  week. In addition, most adults need muscle-strengthening exercises on 2 or more days a week.  Maintain a healthy weight. The body mass index (BMI) is a screening tool to identify possible weight problems. It provides an estimate of body fat based on height and weight. Your health care provider can find your BMI and can help you achieve or maintain a healthy weight. For adults 20 years and older:  A BMI below 18.5 is considered underweight.  A BMI of 18.5 to 24.9 is normal.  A BMI of 25 to 29.9 is considered overweight.  A BMI of 30 and above is considered obese.  Maintain normal blood lipids and cholesterol levels by exercising and minimizing your intake of  saturated fat. Eat a balanced diet with plenty of fruit and vegetables. If your lipid or cholesterol levels are high, you are over 50, or you are at high risk for heart disease, you may need your cholesterol levels checked more frequently. Ongoing high lipid and cholesterol levels should be treated with medicines if diet and exercise are not working.  If you smoke, find out from your health care provider how to quit. If you do not use tobacco, do not start.  Lung cancer screening is recommended for adults aged 41-80 years who are at high risk for developing lung cancer because of a history of smoking. A yearly low-dose CT scan of the lungs is recommended for people who have at least a 30-pack-year history of smoking and are a current smoker or have quit within the past 15 years. A pack year of smoking is smoking an average of 1 pack of cigarettes a day for 1 year (for example: 1 pack a day for 30 years or 2 packs a day for 15 years). Yearly screening should continue until the smoker has stopped smoking for at least 15 years. Yearly screening should be stopped for people who develop a health problem that would prevent them from having lung cancer treatment.  Avoid use of street drugs. Do not share needles with anyone. Ask for help if you need  support or instructions about stopping the use of drugs.  High blood pressure causes heart disease and increases the risk of stroke.  Ongoing high blood pressure should be treated with medicines if weight loss and exercise do not work.  If you are 34-84 years old, ask your health care provider if you should take aspirin to prevent strokes.  Diabetes screening involves taking a blood sample to check your fasting blood sugar level. This should be done once every 3 years, after age 97, if you are within normal weight and without risk factors for diabetes. Testing should be considered at a younger age or be carried out more frequently if you are overweight and have at least 1 risk factor for diabetes.  Breast cancer screening is essential preventive care for women. You should practice "breast self-awareness." This means understanding the normal appearance and feel of your breasts and may include breast self-examination. Any changes detected, no matter how small, should be reported to a health care provider. Women in their 105s and 30s should have a clinical breast exam (CBE) by a health care provider as part of a regular health exam every 1 to 3 years. After age 72, women should have a CBE every year. Starting at age 50, women should consider having a mammogram (breast X-ray test) every year. Women who have a family history of breast cancer should talk to their health care provider about genetic screening. Women at a high risk of breast cancer should talk to their health care providers about having an MRI and a mammogram every year.  Breast cancer gene (BRCA)-related cancer risk assessment is recommended for women who have family members with BRCA-related cancers. BRCA-related cancers include breast, ovarian, tubal, and peritoneal cancers. Having family members with these cancers may be associated with an increased risk for harmful changes (mutations) in the breast cancer genes BRCA1 and BRCA2. Results of the  assessment will determine the need for genetic counseling and BRCA1 and BRCA2 testing.  Routine pelvic exams to screen for cancer are no longer recommended for nonpregnant women who are considered low risk for cancer of the pelvic organs (ovaries,  uterus, and vagina) and who do not have symptoms. Ask your health care provider if a screening pelvic exam is right for you.  If you have had past treatment for cervical cancer or a condition that could lead to cancer, you need Pap tests and screening for cancer for at least 20 years after your treatment. If Pap tests have been discontinued, your risk factors (such as having a new sexual partner) need to be reassessed to determine if screening should be resumed. Some women have medical problems that increase the chance of getting cervical cancer. In these cases, your health care provider may recommend more frequent screening and Pap tests.    Colorectal cancer can be detected and often prevented. Most routine colorectal cancer screening begins at the age of 62 years and continues through age 28 years. However, your health care provider may recommend screening at an earlier age if you have risk factors for colon cancer. On a yearly basis, your health care provider may provide home test kits to check for hidden blood in the stool. Use of a small camera at the end of a tube, to directly examine the colon (sigmoidoscopy or colonoscopy), can detect the earliest forms of colorectal cancer. Talk to your health care provider about this at age 23, when routine screening begins.  Direct exam of the colon should be repeated every 5-10 years through age 31 years, unless early forms of pre-cancerous polyps or small growths are found.  Osteoporosis is a disease in which the bones lose minerals and strength with aging. This can result in serious bone fractures or breaks. The risk of osteoporosis can be identified using a bone density scan. Women ages 83 years and over and women  at risk for fractures or osteoporosis should discuss screening with their health care providers. Ask your health care provider whether you should take a calcium supplement or vitamin D to reduce the rate of osteoporosis.  Menopause can be associated with physical symptoms and risks. Hormone replacement therapy is available to decrease symptoms and risks. You should talk to your health care provider about whether hormone replacement therapy is right for you.  Use sunscreen. Apply sunscreen liberally and repeatedly throughout the day. You should seek shade when your shadow is shorter than you. Protect yourself by wearing long sleeves, pants, a wide-brimmed hat, and sunglasses year round, whenever you are outdoors.  Once a month, do a whole body skin exam, using a mirror to look at the skin on your back. Tell your health care provider of new moles, moles that have irregular borders, moles that are larger than a pencil eraser, or moles that have changed in shape or color.  Stay current with required vaccines (immunizations).  Influenza vaccine. All adults should be immunized every year.  Tetanus, diphtheria, and acellular pertussis (Td, Tdap) vaccine. Pregnant women should receive 1 dose of Tdap vaccine during each pregnancy. The dose should be obtained regardless of the length of time since the last dose. Immunization is preferred during the 27th-36th week of gestation. An adult who has not previously received Tdap or who does not know her vaccine status should receive 1 dose of Tdap. This initial dose should be followed by tetanus and diphtheria toxoids (Td) booster doses every 10 years. Adults with an unknown or incomplete history of completing a 3-dose immunization series with Td-containing vaccines should begin or complete a primary immunization series including a Tdap dose. Adults should receive a Td booster every 10 years.  Zoster vaccine. One dose is recommended for adults aged 42 years or  older unless certain conditions are present.    Pneumococcal 13-valent conjugate (PCV13) vaccine. When indicated, a person who is uncertain of her immunization history and has no record of immunization should receive the PCV13 vaccine. An adult aged 74 years or older who has certain medical conditions and has not been previously immunized should receive 1 dose of PCV13 vaccine. This PCV13 should be followed with a dose of pneumococcal polysaccharide (PPSV23) vaccine. The PPSV23 vaccine dose should be obtained at least 1 or more year(s) after the dose of PCV13 vaccine. An adult aged 44 years or older who has certain medical conditions and previously received 1 or more doses of PPSV23 vaccine should receive 1 dose of PCV13. The PCV13 vaccine dose should be obtained 1 or more years after the last PPSV23 vaccine dose.    Pneumococcal polysaccharide (PPSV23) vaccine. When PCV13 is also indicated, PCV13 should be obtained first. All adults aged 67 years and older should be immunized. An adult younger than age 47 years who has certain medical conditions should be immunized. Any person who resides in a nursing home or long-term care facility should be immunized. An adult smoker should be immunized. People with an immunocompromised condition and certain other conditions should receive both PCV13 and PPSV23 vaccines. People with human immunodeficiency virus (HIV) infection should be immunized as soon as possible after diagnosis. Immunization during chemotherapy or radiation therapy should be avoided. Routine use of PPSV23 vaccine is not recommended for American Indians, [place], or people younger than 65 years unless there are medical conditions that require PPSV23 vaccine. When indicated, people who have unknown immunization and have no record of immunization should receive PPSV23 vaccine. One-time revaccination 5 years after the first dose of PPSV23 is recommended for people aged 19-64 years who have chronic  kidney failure, nephrotic syndrome, asplenia, or immunocompromised conditions. People who received 1-2 doses of PPSV23 before age 36 years should receive another dose of PPSV23 vaccine at age 43 years or later if at least 5 years have passed since the previous dose. Doses of PPSV23 are not needed for people immunized with PPSV23 at or after age 49 years.   Preventive Services / Frequency  Ages 78 years and over  Blood pressure check.  Lipid and cholesterol check.  Lung cancer screening. / Every year if you are aged 8-80 years and have a 30-pack-year history of smoking and currently smoke or have quit within the past 15 years. Yearly screening is stopped once you have quit smoking for at least 15 years or develop a health problem that would prevent you from having lung cancer treatment.  Clinical breast exam.** / Every year after age 92 years.   BRCA-related cancer risk assessment.** / For women who have family members with a BRCA-related cancer (breast, ovarian, tubal, or peritoneal cancers).  Mammogram.** / Every year beginning at age 67 years and continuing for as long as you are in good health. Consult with your health care provider.  Pap test.** / Every 3 years starting at age 72 years through age 82 or 34 years with 3 consecutive normal Pap tests. Testing can be stopped between 65 and 70 years with 3 consecutive normal Pap tests and no abnormal Pap or HPV tests in the past 10 years.  Fecal occult blood test (FOBT) of stool. / Every year beginning at age 33 years and continuing until age 40 years. You may not need to  do this test if you get a colonoscopy every 10 years.  Flexible sigmoidoscopy or colonoscopy.** / Every 5 years for a flexible sigmoidoscopy or every 10 years for a colonoscopy beginning at age 81 years and continuing until age 93 years.  Hepatitis C blood test.** / For all people born from [birth year] through 1963 and any individual with known risks for hepatitis  C.  Osteoporosis screening.** / A one-time screening for women ages 3 years and over and women at risk for fractures or osteoporosis.  Skin self-exam. / Monthly.  Influenza vaccine. / Every year.  Tetanus, diphtheria, and acellular pertussis (Tdap/Td) vaccine.** / 1 dose of Td every 10 years.  Zoster vaccine.** / 1 dose for adults aged 35 years or older.  Pneumococcal 13-valent conjugate (PCV13) vaccine.** / Consult your health care provider.  Pneumococcal polysaccharide (PPSV23) vaccine.** / 1 dose for all adults aged 88 years and older. Screening for abdominal aortic aneurysm (AAA)  by ultrasound is recommended for people who have history of high blood pressure or who are current or former smokers. ++++++++++++++++++++ Recommend Adult Low Dose Aspirin or  coated  Aspirin 81 mg daily  To reduce risk of Colon Cancer 40 %,  Skin Cancer 26 % ,  Melanoma 46%  and  Pancreatic cancer 60% ++++++++++++++++++++ Vitamin D goal  is between 70-100.  Please make sure that you are taking your Vitamin D as directed.  It is very important as a natural anti-inflammatory  helping hair, skin, and nails, as well as reducing stroke and heart attack risk.  It helps your bones and helps with mood. It also decreases numerous cancer risks so please take it as directed.  Low Vit D is associated with a 200-300% higher risk for CANCER  and 200-300% higher risk for HEART   ATTACK  &  STROKE.   .....................................[name] It is also associated with higher death rate at younger ages,  autoimmune diseases like Rheumatoid arthritis, Lupus, Multiple Sclerosis.    Also many other serious conditions, like depression, Alzheimer's Dementia, infertility, muscle aches, fatigue, fibromyalgia - just to name a few. ++++++++++++++++++ Recommend the book "The END of DIETING" by Dr Excell Seltzer  & the book "The END of DIABETES " by Dr Excell Seltzer At [email or web address] - get book & Audio CD's    Being diabetic has  a  300% increased risk for heart attack, stroke, cancer, and alzheimer- type vascular dementia. It is very important that you work harder with diet by avoiding all foods that are white. Avoid white rice (brown & wild rice is OK), white potatoes (sweetpotatoes in moderation is OK), White bread or wheat bread or anything made out of white flour like bagels, donuts, rolls, buns, biscuits, cakes, pastries, cookies, pizza crust, and pasta (made from white flour & egg whites) - vegetarian pasta or spinach or wheat pasta is OK. Multigrain breads like Arnold's or [employer], or multigrain sandwich thins or flatbreads.  Diet, exercise and weight loss can reverse and cure diabetes in the early stages.  Diet, exercise and weight loss is very important in the control and prevention of complications of diabetes which affects every system in your body, ie. Brain - dementia/stroke, eyes - glaucoma/blindness, heart - heart attack/heart failure, kidneys - dialysis, stomach - gastric paralysis, intestines - malabsorption, nerves - severe painful neuritis, circulation - gangrene & loss of a leg(s), and finally cancer and Alzheimers.    I recommend avoid fried & greasy foods,  sweets/candy, white rice (brown or wild  rice or Quinoa is OK), white potatoes (sweet potatoes are OK) - anything made from white flour - bagels, doughnuts, rolls, buns, biscuits,white and wheat breads, pizza crust and traditional pasta made of white flour & egg white(vegetarian pasta or spinach or wheat pasta is OK).  Multi-grain bread is OK - like multi-grain flat bread or sandwich thins. Avoid alcohol in excess. Exercise is also important.    Eat all the vegetables you want - avoid meat, especially red meat and dairy - especially cheese.  Cheese is the most concentrated form of trans-fats which is the worst thing to clog up our arteries. Veggie cheese is OK which can be found in the fresh produce section at [employer] or [employer] or  [employer]  +++++++++++++++++++ DASH Eating Plan  DASH stands for "Dietary Approaches to Stop Hypertension."   The DASH eating plan is a healthy eating plan that has been shown to reduce high blood pressure (hypertension). Additional health benefits may include reducing the risk of type 2 diabetes mellitus, heart disease, and stroke. The DASH eating plan may also help with weight loss. WHAT DO I NEED TO KNOW ABOUT THE DASH EATING PLAN? For the DASH eating plan, you will follow these general guidelines:  Choose foods with a percent daily value for sodium of less than 5% (as listed on the food label).  Use salt-free seasonings or herbs instead of table salt or sea salt.  Check with your health care provider or pharmacist before using salt substitutes.  Eat lower-sodium products, often labeled as "lower sodium" or "no salt added."  Eat fresh foods.  Eat more vegetables, fruits, and low-fat dairy products.  Choose whole grains. Look for the word "whole" as the first word in the ingredient list.  Choose fish   Limit sweets, desserts, sugars, and sugary drinks.  Choose heart-healthy fats.  Eat veggie cheese   Eat more home-cooked food and less restaurant, buffet, and fast food.  Limit fried foods.  Cook foods using methods other than frying.  Limit canned vegetables. If you do use them, rinse them well to decrease the sodium.  When eating at a restaurant, ask that your food be prepared with less salt, or no salt if possible.                      WHAT FOODS CAN I EAT? Read Dr Fara Olden Fuhrman's books on The End of Dieting & The End of Diabetes  Grains Whole grain or whole wheat bread. Brown rice. Whole grain or whole wheat pasta. Quinoa, bulgur, and whole grain cereals. Low-sodium cereals. Corn or whole wheat flour tortillas. Whole grain cornbread. Whole grain crackers. Low-sodium crackers.  Vegetables Fresh or frozen vegetables (raw, steamed, roasted, or grilled). Low-sodium or  reduced-sodium tomato and vegetable juices. Low-sodium or reduced-sodium tomato sauce and paste. Low-sodium or reduced-sodium canned vegetables.   Fruits All fresh, canned (in natural juice), or frozen fruits.  Protein Products  All fish and seafood.  Dried beans, peas, or lentils. Unsalted nuts and seeds. Unsalted canned beans.  Dairy Low-fat dairy products, such as skim or 1% milk, 2% or reduced-fat cheeses, low-fat ricotta or cottage cheese, or plain low-fat yogurt. Low-sodium or reduced-sodium cheeses.  Fats and Oils Tub margarines without trans fats. Light or reduced-fat mayonnaise and salad dressings (reduced sodium). Avocado. Safflower, olive, or canola oils. Natural peanut or almond butter.  Other Unsalted popcorn and pretzels. The items listed above may not be a complete list of recommended foods  or beverages. Contact your dietitian for more options.  +++++++++++++++  WHAT FOODS ARE NOT RECOMMENDED? Grains/ White flour or wheat flour White bread. White pasta. White rice. Refined cornbread. Bagels and croissants. Crackers that contain trans fat.  Vegetables  Creamed or fried vegetables. Vegetables in a . Regular canned vegetables. Regular canned tomato sauce and paste. Regular tomato and vegetable juices.  Fruits Dried fruits. Canned fruit in light or heavy syrup. Fruit juice.  Meat and Other Protein Products Meat in general - RED meat & White meat.  Fatty cuts of meat. Ribs, chicken wings, all processed meats as bacon, sausage, bologna, salami, fatback, hot dogs, bratwurst and packaged luncheon meats.  Dairy Whole or 2% milk, cream, half-and-half, and cream cheese. Whole-fat or sweetened yogurt. Full-fat cheeses or blue cheese. Non-dairy creamers and whipped toppings. Processed cheese, cheese spreads, or cheese curds.  Condiments Onion and garlic salt, seasoned salt, table salt, and sea salt. Canned and packaged gravies. Worcestershire sauce. Tartar sauce. Barbecue  sauce. Teriyaki sauce. Soy sauce, including reduced sodium. Steak sauce. Fish sauce. Oyster sauce. Cocktail sauce. Horseradish. Ketchup and mustard. Meat flavorings and tenderizers. Bouillon cubes. Hot sauce. Tabasco sauce. Marinades. Taco seasonings. Relishes.  Fats and Oils Butter, stick margarine, lard, shortening and bacon fat. Coconut, palm kernel, or palm oils. Regular salad dressings.  Pickles and olives. Salted popcorn and pretzels.  The items listed above may not be a complete list of foods and beverages to avoid.

## 2020-11-10 ENCOUNTER — Other Ambulatory Visit: Payer: Self-pay

## 2020-11-10 ENCOUNTER — Other Ambulatory Visit: Payer: Self-pay | Admitting: Internal Medicine

## 2020-11-10 ENCOUNTER — Ambulatory Visit (INDEPENDENT_AMBULATORY_CARE_PROVIDER_SITE_OTHER): Payer: PPO | Admitting: Internal Medicine

## 2020-11-10 VITALS — BP 126/84 | HR 71 | Temp 96.3°F | Resp 16 | Ht 64.0 in | Wt 117.0 lb

## 2020-11-10 DIAGNOSIS — M810 Age-related osteoporosis without current pathological fracture: Secondary | ICD-10-CM

## 2020-11-10 DIAGNOSIS — Z1231 Encounter for screening mammogram for malignant neoplasm of breast: Secondary | ICD-10-CM

## 2020-11-10 DIAGNOSIS — E559 Vitamin D deficiency, unspecified: Secondary | ICD-10-CM | POA: Diagnosis not present

## 2020-11-10 DIAGNOSIS — G250 Essential tremor: Secondary | ICD-10-CM

## 2020-11-10 DIAGNOSIS — G4733 Obstructive sleep apnea (adult) (pediatric): Secondary | ICD-10-CM

## 2020-11-10 DIAGNOSIS — Z0000 Encounter for general adult medical examination without abnormal findings: Secondary | ICD-10-CM | POA: Diagnosis not present

## 2020-11-10 DIAGNOSIS — Z136 Encounter for screening for cardiovascular disorders: Secondary | ICD-10-CM

## 2020-11-10 DIAGNOSIS — E039 Hypothyroidism, unspecified: Secondary | ICD-10-CM

## 2020-11-10 DIAGNOSIS — J4489 Other specified chronic obstructive pulmonary disease: Secondary | ICD-10-CM

## 2020-11-10 DIAGNOSIS — Z1211 Encounter for screening for malignant neoplasm of colon: Secondary | ICD-10-CM

## 2020-11-10 DIAGNOSIS — Z79899 Other long term (current) drug therapy: Secondary | ICD-10-CM | POA: Diagnosis not present

## 2020-11-10 DIAGNOSIS — R0989 Other specified symptoms and signs involving the circulatory and respiratory systems: Secondary | ICD-10-CM | POA: Diagnosis not present

## 2020-11-10 DIAGNOSIS — Z8249 Family history of ischemic heart disease and other diseases of the circulatory system: Secondary | ICD-10-CM

## 2020-11-10 DIAGNOSIS — E782 Mixed hyperlipidemia: Secondary | ICD-10-CM

## 2020-11-10 DIAGNOSIS — J479 Bronchiectasis, uncomplicated: Secondary | ICD-10-CM

## 2020-11-10 DIAGNOSIS — Z0001 Encounter for general adult medical examination with abnormal findings: Secondary | ICD-10-CM

## 2020-11-10 DIAGNOSIS — Z8579 Personal history of other malignant neoplasms of lymphoid, hematopoietic and related tissues: Secondary | ICD-10-CM

## 2020-11-10 DIAGNOSIS — R7309 Other abnormal glucose: Secondary | ICD-10-CM | POA: Diagnosis not present

## 2020-11-10 DIAGNOSIS — I700 Atherosclerosis of aorta: Secondary | ICD-10-CM | POA: Diagnosis not present

## 2020-11-10 DIAGNOSIS — Z8572 Personal history of non-Hodgkin lymphomas: Secondary | ICD-10-CM

## 2020-11-10 DIAGNOSIS — J449 Chronic obstructive pulmonary disease, unspecified: Secondary | ICD-10-CM

## 2020-11-11 LAB — MICROALBUMIN / CREATININE URINE RATIO
Creatinine, Urine: 54 mg/dL (ref 20–275)
Microalb Creat Ratio: 39 mcg/mg creat — ABNORMAL HIGH (ref <30)
Microalb, Ur: 2.1 mg/dL

## 2020-11-11 LAB — MAGNESIUM: Magnesium: 1.9 mg/dL (ref 1.5–2.5)

## 2020-11-11 LAB — CBC WITH DIFFERENTIAL/PLATELET
Absolute Monocytes: 663 cells/uL (ref 200–950)
Basophils Absolute: 51 cells/uL (ref 0–200)
Basophils Relative: 1 %
Eosinophils Absolute: 112 cells/uL (ref 15–500)
Eosinophils Relative: 2.2 %
HCT: 42.7 % (ref 35.0–45.0)
Hemoglobin: 14.1 g/dL (ref 11.7–15.5)
Lymphs Abs: 1051 cells/uL (ref 850–3900)
MCH: 29.4 pg (ref 27.0–33.0)
MCHC: 33 g/dL (ref 32.0–36.0)
MCV: 89 fL (ref 80.0–100.0)
MPV: 10.1 fL (ref 7.5–12.5)
Monocytes Relative: 13 %
Neutro Abs: 3223 cells/uL (ref 1500–7800)
Neutrophils Relative %: 63.2 %
Platelets: 207 10*3/uL (ref 140–400)
RBC: 4.8 10*6/uL (ref 3.80–5.10)
RDW: 12.8 % (ref 11.0–15.0)
Total Lymphocyte: 20.6 %
WBC: 5.1 10*3/uL (ref 3.8–10.8)

## 2020-11-11 LAB — HEMOGLOBIN A1C
Hgb A1c MFr Bld: 5.5 % of total Hgb (ref <5.7)
Mean Plasma Glucose: 111 mg/dL
eAG (mmol/L): 6.2 mmol/L

## 2020-11-11 LAB — COMPLETE METABOLIC PANEL WITH GFR
AG Ratio: 2.3 (calc) (ref 1.0–2.5)
ALT: 12 U/L (ref 6–29)
AST: 26 U/L (ref 10–35)
Albumin: 4.4 g/dL (ref 3.6–5.1)
Alkaline phosphatase (APISO): 59 U/L (ref 37–153)
BUN/Creatinine Ratio: 14 (calc) (ref 6–22)
BUN: 13 mg/dL (ref 7–25)
CO2: 29 mmol/L (ref 20–32)
Calcium: 9.6 mg/dL (ref 8.6–10.4)
Chloride: 101 mmol/L (ref 98–110)
Creat: 0.94 mg/dL — ABNORMAL HIGH (ref 0.60–0.93)
GFR, Est African American: 68 mL/min/{1.73_m2} (ref >=60)
GFR, Est Non African American: 59 mL/min/{1.73_m2} — ABNORMAL LOW (ref >=60)
Globulin: 1.9 g/dL (calc) (ref 1.9–3.7)
Glucose, Bld: 81 mg/dL (ref 65–99)
Potassium: 3.9 mmol/L (ref 3.5–5.3)
Sodium: 138 mmol/L (ref 135–146)
Total Bilirubin: 0.7 mg/dL (ref 0.2–1.2)
Total Protein: 6.3 g/dL (ref 6.1–8.1)

## 2020-11-11 LAB — URINALYSIS, ROUTINE W REFLEX MICROSCOPIC
Bilirubin Urine: NEGATIVE
Glucose, UA: NEGATIVE
Hgb urine dipstick: NEGATIVE
Ketones, ur: NEGATIVE
Leukocytes,Ua: NEGATIVE
Nitrite: NEGATIVE
Protein, ur: NEGATIVE
Specific Gravity, Urine: 1.008 (ref 1.001–1.03)
pH: 7.5 (ref 5.0–8.0)

## 2020-11-11 LAB — LIPID PANEL
Cholesterol: 174 mg/dL (ref <200)
HDL: 65 mg/dL (ref >=50)
LDL Cholesterol (Calc): 89 mg/dL (calc)
Non-HDL Cholesterol (Calc): 109 mg/dL (calc) (ref <130)
Total CHOL/HDL Ratio: 2.7 (calc) (ref <5.0)
Triglycerides: 100 mg/dL (ref <150)

## 2020-11-11 LAB — VITAMIN D 25 HYDROXY (VIT D DEFICIENCY, FRACTURES): Vit D, 25-Hydroxy: 67 ng/mL (ref 30–100)

## 2020-11-11 LAB — INSULIN, RANDOM: Insulin: 3.5 u[IU]/mL

## 2020-11-11 LAB — TSH: TSH: 4.05 mIU/L (ref 0.40–4.50)

## 2020-11-11 NOTE — Progress Notes (Signed)
============================================================ ============================================================  -    Total Chol = 174   and LDL Chol = 89 - Both   Excellent   - Very low risk for Heart Attack  / Stroke ========================================================  - A1c 5.5% - Great - Still staying down in the Normal NonDiabetic Range ============================================================ ============================================================  -  Vitamin D = 67 - Excellent  !    Please keep dose same   ============================================================ ============================================================  -  All Else - CBC - Kidneys - Electrolytes - Liver - Magnesium & Thyroid    - all  Normal / OK ============================================================  ============================================================  - Keep up the [place] Work !   ============================================================ ============================================================

## 2020-11-12 ENCOUNTER — Encounter: Payer: Self-pay | Admitting: *Deleted

## 2020-11-26 DIAGNOSIS — G4733 Obstructive sleep apnea (adult) (pediatric): Secondary | ICD-10-CM | POA: Diagnosis not present

## 2020-12-09 ENCOUNTER — Other Ambulatory Visit: Payer: Self-pay

## 2020-12-09 DIAGNOSIS — Z1211 Encounter for screening for malignant neoplasm of colon: Secondary | ICD-10-CM

## 2020-12-09 LAB — POC HEMOCCULT BLD/STL (HOME/3-CARD/SCREEN)
Card #2 Fecal Occult Blod, POC: NEGATIVE
Card #3 Fecal Occult Blood, POC: NEGATIVE
Fecal Occult Blood, POC: NEGATIVE

## 2020-12-10 DIAGNOSIS — Z1211 Encounter for screening for malignant neoplasm of colon: Secondary | ICD-10-CM | POA: Diagnosis not present

## 2020-12-10 DIAGNOSIS — Z1212 Encounter for screening for malignant neoplasm of rectum: Secondary | ICD-10-CM | POA: Diagnosis not present

## 2020-12-23 DIAGNOSIS — H6122 Impacted cerumen, left ear: Secondary | ICD-10-CM | POA: Diagnosis not present

## 2020-12-23 DIAGNOSIS — H6123 Impacted cerumen, bilateral: Secondary | ICD-10-CM | POA: Diagnosis not present

## 2020-12-23 DIAGNOSIS — H6121 Impacted cerumen, right ear: Secondary | ICD-10-CM | POA: Diagnosis not present

## 2020-12-23 DIAGNOSIS — R42 Dizziness and giddiness: Secondary | ICD-10-CM | POA: Diagnosis not present

## 2020-12-27 DIAGNOSIS — G4733 Obstructive sleep apnea (adult) (pediatric): Secondary | ICD-10-CM | POA: Diagnosis not present

## 2021-01-02 ENCOUNTER — Ambulatory Visit
Admission: RE | Admit: 2021-01-02 | Discharge: 2021-01-02 | Disposition: A | Discharge status: Home / Self Care | Payer: PPO | Source: Ambulatory Visit | Attending: Internal Medicine | Admitting: Internal Medicine

## 2021-01-02 ENCOUNTER — Other Ambulatory Visit: Payer: Self-pay

## 2021-01-02 DIAGNOSIS — Z1231 Encounter for screening mammogram for malignant neoplasm of breast: Secondary | ICD-10-CM | POA: Diagnosis not present

## 2021-01-15 DIAGNOSIS — J449 Chronic obstructive pulmonary disease, unspecified: Secondary | ICD-10-CM | POA: Diagnosis not present

## 2021-01-15 DIAGNOSIS — J471 Bronchiectasis with (acute) exacerbation: Secondary | ICD-10-CM | POA: Diagnosis not present

## 2021-01-15 DIAGNOSIS — G4733 Obstructive sleep apnea (adult) (pediatric): Secondary | ICD-10-CM | POA: Diagnosis not present

## 2021-01-19 ENCOUNTER — Other Ambulatory Visit: Payer: Self-pay | Admitting: Internal Medicine

## 2021-01-19 DIAGNOSIS — E782 Mixed hyperlipidemia: Secondary | ICD-10-CM

## 2021-01-22 ENCOUNTER — Other Ambulatory Visit: Payer: Self-pay | Admitting: Internal Medicine

## 2021-01-26 DIAGNOSIS — G4733 Obstructive sleep apnea (adult) (pediatric): Secondary | ICD-10-CM | POA: Diagnosis not present

## 2021-01-27 ENCOUNTER — Telehealth: Payer: Self-pay | Admitting: *Deleted

## 2021-01-27 ENCOUNTER — Other Ambulatory Visit: Payer: Self-pay

## 2021-01-27 ENCOUNTER — Inpatient Hospital Stay: Payer: PPO | Attending: Hematology & Oncology

## 2021-01-27 ENCOUNTER — Inpatient Hospital Stay (HOSPITAL_BASED_OUTPATIENT_CLINIC_OR_DEPARTMENT_OTHER): Payer: PPO | Admitting: Hematology & Oncology

## 2021-01-27 ENCOUNTER — Encounter: Payer: Self-pay | Admitting: Hematology & Oncology

## 2021-01-27 VITALS — BP 150/67 | HR 79 | Temp 98.5°F | Resp 18 | Wt 117.0 lb

## 2021-01-27 DIAGNOSIS — D801 Nonfamilial hypogammaglobulinemia: Secondary | ICD-10-CM | POA: Diagnosis not present

## 2021-01-27 DIAGNOSIS — F0390 Unspecified dementia without behavioral disturbance: Secondary | ICD-10-CM | POA: Insufficient documentation

## 2021-01-27 DIAGNOSIS — J470 Bronchiectasis with acute lower respiratory infection: Secondary | ICD-10-CM

## 2021-01-27 DIAGNOSIS — C8313 Mantle cell lymphoma, intra-abdominal lymph nodes: Secondary | ICD-10-CM | POA: Diagnosis not present

## 2021-01-27 DIAGNOSIS — C8310 Mantle cell lymphoma, unspecified site: Secondary | ICD-10-CM | POA: Diagnosis not present

## 2021-01-27 LAB — CBC WITH DIFFERENTIAL (CANCER CENTER ONLY)
Abs Immature Granulocytes: 0.02 10*3/uL (ref 0.00–0.07)
Basophils Absolute: 0.1 10*3/uL (ref 0.0–0.1)
Basophils Relative: 1 %
Eosinophils Absolute: 0.1 10*3/uL (ref 0.0–0.5)
Eosinophils Relative: 2 %
HCT: 44.3 % (ref 36.0–46.0)
Hemoglobin: 14.2 g/dL (ref 12.0–15.0)
Immature Granulocytes: 0 %
Lymphocytes Relative: 28 %
Lymphs Abs: 1.8 10*3/uL (ref 0.7–4.0)
MCH: 29 pg (ref 26.0–34.0)
MCHC: 32.1 g/dL (ref 30.0–36.0)
MCV: 90.4 fL (ref 80.0–100.0)
Monocytes Absolute: 0.8 10*3/uL (ref 0.1–1.0)
Monocytes Relative: 12 %
Neutro Abs: 3.6 10*3/uL (ref 1.7–7.7)
Neutrophils Relative %: 57 %
Platelet Count: 198 10*3/uL (ref 150–400)
RBC: 4.9 MIL/uL (ref 3.87–5.11)
RDW: 13.5 % (ref 11.5–15.5)
WBC Count: 6.4 10*3/uL (ref 4.0–10.5)
nRBC: 0 % (ref 0.0–0.2)

## 2021-01-27 LAB — CMP (CANCER CENTER ONLY)
ALT: 16 U/L (ref 0–44)
AST: 30 U/L (ref 15–41)
Albumin: 4.8 g/dL (ref 3.5–5.0)
Alkaline Phosphatase: 61 U/L (ref 38–126)
Anion gap: 9 (ref 5–15)
BUN: 17 mg/dL (ref 8–23)
CO2: 30 mmol/L (ref 22–32)
Calcium: 10.4 mg/dL — ABNORMAL HIGH (ref 8.9–10.3)
Chloride: 98 mmol/L (ref 98–111)
Creatinine: 0.97 mg/dL (ref 0.44–1.00)
GFR, Estimated: >60 mL/min (ref >60)
Glucose, Bld: 97 mg/dL (ref 70–99)
Potassium: 4.4 mmol/L (ref 3.5–5.1)
Sodium: 137 mmol/L (ref 135–145)
Total Bilirubin: 0.6 mg/dL (ref 0.3–1.2)
Total Protein: 6.3 g/dL — ABNORMAL LOW (ref 6.5–8.1)

## 2021-01-27 LAB — LACTATE DEHYDROGENASE: LDH: 206 U/L — ABNORMAL HIGH (ref 98–192)

## 2021-01-27 MED ORDER — TEMAZEPAM 15 MG PO CAPS: 15.0000 mg | ORAL_CAPSULE | Freq: Every evening | ORAL | 0 refills | Status: DC | PRN

## 2021-01-27 NOTE — Progress Notes (Signed)
Hematology and Oncology Follow Up Visit  Shannon Parsons [id number] [birth year] 80 y.o. 01/27/2021   Principle Diagnosis:   Mantle cell lymphoma (MCIPI = 5)  Thromboembolic event of the right leg  Acquired Hypogammaglobulinemia  Bronchiectasis  Current Therapy:    Rituxan - maintanence - s/p cycle #10 - to finish in 07/2017  IVIG - q  month -restart on 10/27/2017; last dose 01/05/2018     Interim History:    [name] now comes in for follow-up.  We see her every 6 months.  She is doing okay from her own point of view.  She does have some sleep issues.  We did give her Restoril in the past.  This did not seem to help her.  I will go ahead and refill this for her.  The big problem is her poor husband.  He has worsening dementia.  This is becoming more of a problem for her.  She has had no problems with fever.  She has had no change in bowel or bladder habits.  There has been no problems with nausea or vomiting.  She is eating pretty well.  She has had no problems with rashes.  There is been no leg swelling.  Overall, she seems to be doing pretty well.  Her Port-A-Cath is now taken out.  She is happy about this.  She has had absolutely  no problems with respect to mantle cell lymphoma.  She has had no evidence of recurrence today.  I would like to think that she is not going to have any issues with respect to recurrent disease at this point.  She has had no problems with fever.  She has had her coronavirus vaccines.  Her husband still is having his problems with dementia.  It sounds like he is getting little bit worse.  Overall, I would have to say that her performance status is ECOG 1.    Medications:  Current Outpatient Medications:  .  acidophilus (RISAQUAD) CAPS capsule, Take 1 capsule by mouth daily. , Disp: , Rfl:  .  Cholecalciferol (VITAMIN D3) 50 MCG (2000 UT) capsule, Take 4,000 Units by mouth daily. , Disp: , Rfl:  .  diazepam (VALIUM) 5 MG tablet, Take 1/2 to  1 tablet     3 x /day      for Tremor, Disp: 270 tablet, Rfl: 0 .  hyoscyamine (LEVSIN SL) 0.125 MG SL tablet, Take    1 to 2 tablets     every 4 hours       as needed for Nausea, Cramping, Bloating or Diarrhea DIARRHEA, Disp: 90 tablet, Rfl: 0 .  levothyroxine (SYNTHROID) 50 MCG tablet, Take  1 tablet  Daily  on an empty stomach with only water for 30 minutes & no Antacid meds, Calcium or Magnesium for 4 hours & avoid Biotin, Disp: 90 tablet, Rfl: 3 .  LORazepam (ATIVAN) 0.5 MG tablet, lorazepam 0.5 mg tablet  TAKE 1 TABLET BY MOUTH EVERY 8 HOURS AS NEEDED FOR SLEEP (NAUSEA OR VOMITING)., Disp: , Rfl:  .  rosuvastatin (CRESTOR) 5 MG tablet, Take  1/4 tablet  Daily  for Cholesterol, Disp: 23 tablet, Rfl: 1 .  COVID-19 mRNA vaccine, Pfizer, 30 MCG/0.3ML injection, USE AS DIRECTED, Disp: .3 mL, Rfl: 0  Allergies:  Allergies  Allergen Reactions  . Alendronate Other (See Comments)    "made me choke"    . Risedronate Sodium Other (See Comments)    "made me choke"    . Sulfa  Antibiotics Other (See Comments)    Doesn't remember   . Sulfasalazine Other (See Comments)    Doesn't remember   . Other Rash  . Tetracyclines & Related Rash    Past Medical History, Surgical history, Social history, and Family History were reviewed and updated.  Review of Systems: Review of Systems  Constitutional: Positive for malaise/fatigue.  HENT: Positive for congestion.   Eyes: Negative.   Respiratory: Positive for shortness of breath and wheezing.   Cardiovascular: Negative.   Gastrointestinal: Negative.   Genitourinary: Negative.   Musculoskeletal: Negative.   Skin: Negative.   Neurological: Negative.   Endo/Heme/Allergies: Negative.   Psychiatric/Behavioral: Negative.     Physical Exam:  weight is 117 lb (53.1 kg). Her oral temperature is 98.5 F (36.9 C). Her blood pressure is 150/67 (abnormal) and her pulse is 79. Her respiration is 18 and oxygen saturation is 100%.   Physical Exam Vitals  reviewed.  HENT:     Head: Normocephalic and atraumatic.  Eyes:     Pupils: Pupils are equal, round, and reactive to light.  Cardiovascular:     Rate and Rhythm: Normal rate and regular rhythm.     Heart sounds: Normal heart sounds.  Pulmonary:     Effort: Pulmonary effort is normal.     Breath sounds: Normal breath sounds.  Abdominal:     General: Bowel sounds are normal.     Palpations: Abdomen is soft.  Musculoskeletal:        General: No tenderness or deformity. Normal range of motion.     Cervical back: Normal range of motion.  Lymphadenopathy:     Cervical: No cervical adenopathy.  Skin:    General: Skin is warm and dry.     Findings: No erythema or rash.  Neurological:     Mental Status: She is alert and oriented to person, place, and time.  Psychiatric:        Behavior: Behavior normal.        Thought Content: Thought content normal.        Judgment: Judgment normal.      Lab Results  Component Value Date   WBC 6.4 01/27/2021   HGB 14.2 01/27/2021   HCT 44.3 01/27/2021   MCV 90.4 01/27/2021   PLT 198 01/27/2021     Chemistry      Component Value Date/Time   NA 137 01/27/2021 1115   NA 141 07/13/2017 0743   NA 138 08/01/2015 1124   K 4.4 01/27/2021 1115   K 4.2 07/13/2017 0743   K 4.0 08/01/2015 1124   CL 98 01/27/2021 1115   CL 105 07/13/2017 0743   CO2 30 01/27/2021 1115   CO2 27 07/13/2017 0743   CO2 28 08/01/2015 1124   BUN 17 01/27/2021 1115   BUN 16 07/13/2017 0743   BUN 10.4 08/01/2015 1124   CREATININE 0.97 01/27/2021 1115   CREATININE 0.94 (H) 11/10/2020 0957   CREATININE 0.8 08/01/2015 1124      Component Value Date/Time   CALCIUM 10.4 (H) 01/27/2021 1115   CALCIUM 9.5 07/13/2017 0743   CALCIUM 9.6 08/01/2015 1124   ALKPHOS 61 01/27/2021 1115   ALKPHOS 68 07/13/2017 0743   ALKPHOS 88 08/01/2015 1124   AST 30 01/27/2021 1115   AST 23 08/01/2015 1124   ALT 16 01/27/2021 1115   ALT 19 07/13/2017 0743   ALT 12 08/01/2015 1124    BILITOT 0.6 01/27/2021 1115   BILITOT <0.30 08/01/2015 1124  Impression and Plan: [name] is 80 year old white female. She has mantle cell lymphoma. Again, by the [facility] she is an intermediate risk.  She received 2 different courses of chemotherapy. She could not tolerate R-CHOP with Velcade substituting for vincristine. This however was incredibly helpful. We finished up with Rituxan/bendamustine. She completed this in August 2016.  We then started maintenance Rituxan. She started this in November 2016.  She completed this in November 2018.    I do not see any indication for scans.  Her exam is unremarkable.  Her lab work looks [employer].  I know that she is doing her best to try to help her poor husband.  We will plan to see her back in 6 months.  Volanda Napoleon, MD 5/17/202212:08 PM

## 2021-01-27 NOTE — Telephone Encounter (Signed)
Per 01/27/21 los - gave upcoming appointments - mailed calendar

## 2021-01-28 LAB — IGG, IGA, IGM
IgA: 16 mg/dL — ABNORMAL LOW (ref 64–422)
IgG (Immunoglobin G), Serum: 523 mg/dL — ABNORMAL LOW (ref 586–1602)
IgM (Immunoglobulin M), Srm: 41 mg/dL (ref 26–217)

## 2021-01-29 NOTE — Progress Notes (Signed)
Future Appointments  Date Time Provider [place]  01/30/2021  9:30 AM Unk Pinto, MD [facility] None  05/13/2021 10:30 AM Liane Comber, NP [facility] None  07/01/2021 10:30 AM Dohmeier, Asencion Partridge, MD [facility] None  07/30/2021 12:00 PM Volanda Napoleon, MD [facility] None  11/17/2021 11:00 AM Unk Pinto, MD [facility] None    History of Present Illness:       This very nice 80 y.o.  MWF with   labile HTN, HLD, Hypothyroidism, Prediabetes,  Essential Hereditary Tremor ,OSA/CPAP  and Vitamin D Deficiency   Medications  .  levothyroxine  50 MCG tablet, Take  1 tablet  Daily .  rosuvastatin  5 MG tablet, Take  1/4 tablet  Daily  for Cholesterol  .  acidophilus (RISAQUAD) CAPS capsule, Take 1 capsule by mouth daily.  .  Cholecalciferol (VITAMIN D3) 50 MCG (2000 UT) capsule, Take 4,000 Units by mouth daily.  [name]  COVID-19 mRNA vaccine, Pfizer, 30 MCG/0.3ML injection, USE AS DIRECTED .  hyoscyamine (LEVSIN SL) 0.125 MG SL tablet, Take    1 to 2 tablets     every 4 hours       as needed for Nausea, Cramping, Bloating or Diarrhea DIARRHEA .  LORazepam (ATIVAN) 0.5 MG tablet, lorazepam 0.5 mg tablet  TAKE 1 TABLET BY MOUTH EVERY 8 HOURS AS NEEDED FOR SLEEP (NAUSEA OR VOMITING). [name]  temazepam (RESTORIL) 15 MG capsule, Take 1 capsule  at bedtime as needed for sleep.  Problem list She has Chronic obstructive airway disease with asthma ([place]); History of colonic polyps; Hyperlipidemia, mixed; Hypotension; GERD; Osteoporosis  (Refuses treatment); Osteoarthritis; Vitamin D deficiency; IBS; Diverticulosis; Anxiety state; OSA and COPD overlap syndrome ([place]); Medication management; Mantle cell lymphoma ([place]); Normocytic anemia; History of DVT (deep vein thrombosis); Body mass index (BMI) of 20.0-20.9 in adult; Obstructive bronchiectasis ([place]); Acquired hypogammaglobulinemia ([place]); Atherosclerosis of aorta ([place]) by PET Scan 04/21/2018.; Sinusitis, maxillary, chronic; Cervical  dystonia; History of lymphoma; Tremor; Hypothyroidism; Abnormal glucose; Labile hypertension; Bronchiectasis with acute lower respiratory infection ([place]); OSA (obstructive sleep apnea); Moderate persistent asthma with status asthmaticus; and Chemotherapy-induced lung disease on their problem list.   Observations/Objective:  BP 140/80   Pulse 70   Temp (!) 97.5 F (36.4 C)   Ht 5\' 4"  (1.626 m)   Wt 116 lb (52.6 kg)   LMP 09/14/2014   SpO2 97%   BMI 19.91 kg/m   HEENT - WNL. Neck - supple.  Chest - Clear equal BS. Cor - Nl HS. RRR w/o sig MGR. PP 1(+). No edema.           Few non inflamed mildly dilated veins of the Lower extremities.   No tenderness is noted & calf compression & Bevelyn Buckles' signs are unremarkable.  MS- FROM w/o deformities.  Gait Nl. Neuro -  Nl w/o focal abnormalities.  Assessment and Plan:  1. Labile hypertension   2. Hyperlipidemia, mixed   3. Abnormal glucose   4. Vitamin D deficiency     Patient is reassured that she does not have significant varicosities .   Follow Up Instructions:      I discussed the assessment and treatment plan with the patient. The patient was provided an opportunity to ask questions and all were answered. The patient agreed with the plan and demonstrated an understanding of the instructions.       The patient was advised to call back or seek an in-person evaluation if the symptoms worsen or if the condition  fails to improve as anticipated.   Kirtland Bouchard, MD

## 2021-01-30 ENCOUNTER — Other Ambulatory Visit: Payer: Self-pay

## 2021-01-30 ENCOUNTER — Encounter: Payer: Self-pay | Admitting: Internal Medicine

## 2021-01-30 ENCOUNTER — Ambulatory Visit (INDEPENDENT_AMBULATORY_CARE_PROVIDER_SITE_OTHER): Payer: PPO | Admitting: Internal Medicine

## 2021-01-30 VITALS — BP 140/80 | HR 70 | Temp 97.5°F | Ht 64.0 in | Wt 116.0 lb

## 2021-01-30 DIAGNOSIS — R7309 Other abnormal glucose: Secondary | ICD-10-CM | POA: Diagnosis not present

## 2021-01-30 DIAGNOSIS — E782 Mixed hyperlipidemia: Secondary | ICD-10-CM

## 2021-01-30 DIAGNOSIS — R0989 Other specified symptoms and signs involving the circulatory and respiratory systems: Secondary | ICD-10-CM | POA: Diagnosis not present

## 2021-01-30 DIAGNOSIS — E559 Vitamin D deficiency, unspecified: Secondary | ICD-10-CM | POA: Diagnosis not present

## 2021-02-01 NOTE — Patient Instructions (Signed)

## 2021-02-26 ENCOUNTER — Telehealth: Payer: Self-pay | Admitting: Gastroenterology

## 2021-02-26 DIAGNOSIS — G4733 Obstructive sleep apnea (adult) (pediatric): Secondary | ICD-10-CM | POA: Diagnosis not present

## 2021-02-26 NOTE — Telephone Encounter (Signed)
No answer no voice mail-line rings then goes busy

## 2021-02-26 NOTE — Telephone Encounter (Signed)
Inbound call from pt requesting a call back stating that she is having some leakage in the rectal area. Please advise. Thanks

## 2021-03-03 NOTE — Progress Notes (Signed)
Future Appointments  Date Time Provider [place]  03/04/2021  9:00 AM Unk Pinto, MD [facility] None  05/13/2021 - Wellness 10:30 AM Liane Comber, NP [facility] None  07/01/2021 10:30 AM Dohmeier, Asencion Partridge, MD [facility] None  07/30/2021 12:00 PM Volanda Napoleon, MD [facility] None  11/17/2021  - CPE 11:00 AM Unk Pinto, MD [facility] None    History of Present Illness:               The patient is very nice 80 y.o.  MWF with   labile HTN, HLD, Hypothyroidism, Prediabetes,  Essential Hereditary Tremor ,OSA/CPAP  and Vitamin D Deficiency who presents with c/o dry non productive cough, sore throat and fullness in her ears .  No fevers , chills, dyspnea or sweats. Covid test is Negative today.   Medications    levothyroxine 50 MCG tablet, Take  1 tablet  Daily      rosuvastatin 5 MG tablet, Take  1/4 tablet  Daily      acidophilus (RISAQUAD), Take 1 capsule daily.    VITAMIN D 2000 u, Take 4,000 Units by mouth daily.    COVID-19 mRNA vaccine, Pfizer, 30 MCG/0.3ML injection,    hyoscyamine  SL) 0.125 MG SL tablet, Take 1 to 2 tablets  every 4 hours as needed    LORazepam (ATIVAN) 0.5 MG tablet, lorazepam 0.5 mg tablet  TAKE 1 TABLET EVERY 8 HOURS AS NEEDED FOR SLEEP   temazepam (RESTORIL) 15 MG capsule, Take 1 capsule at bedtime as needed for sleep.  Problem list She has Chronic obstructive airway disease with asthma ([place]); History of colonic polyps; Hyperlipidemia, mixed; Hypotension; GERD; Osteoporosis  (Refuses treatment); Osteoarthritis; Vitamin D deficiency; IBS; Diverticulosis; Anxiety state; OSA and COPD overlap syndrome ([place]); Medication management; Mantle cell lymphoma ([place]); Normocytic anemia; History of DVT (deep vein thrombosis); Body mass index (BMI) of 20.0-20.9 in adult; Obstructive bronchiectasis ([place]); Acquired hypogammaglobulinemia ([place]); Atherosclerosis of aorta ([place]) by PET Scan 04/21/2018.; Sinusitis, maxillary, chronic; Cervical dystonia; History of  lymphoma; Tremor; Hypothyroidism; Abnormal glucose; Labile hypertension; Bronchiectasis with acute lower respiratory infection ([place]); OSA (obstructive sleep apnea); Moderate persistent asthma with status asthmaticus; and Chemotherapy-induced lung disease on their problem list.   Observations/Objective:  BP 130/68   Pulse 71   Temp (!) 97.5 F (36.4 C)   Resp 16   Ht 5\' 4"  (1.626 m)   Wt 117 lb (53.1 kg)   LMP 09/14/2014   SpO2 99%   BMI 20.08 kg/m   HEENT -  EAC's patent. TM's sl retracted. O/P - 1 + injected w/o exudate  Neck - supple.  Chest - Few scattered dry rales. Cor - Nl HS. RRR w/o sig MGR. PP 1(+). No edema. MS- FROM w/o deformities.  Gait Nl. Neuro -  Nl w/o focal abnormalities.  Assessment and Plan:   1. Tracheitis  2. Pharyngitis, unspecified etiology  3. Bilateral acute serous otitis media    - dexamethasone 2 MG tablet; Take 1 tab 3 x day - 3 days, then 2 x day - 3 days, then 1 tab daily  Disp: 20 tablet  - azithromycin 250 MG tablet; Take 2 tablets with Food on  Day 1, then 1 tablet Daily with Food for Sinusitis / Bronchitis  Disp: 6 each; Refill: 1   Follow Up Instructions:        I discussed the assessment and treatment plan with the patient. The patient was provided an opportunity to ask questions and all were answered. The  patient agreed with the plan and demonstrated an understanding of the instructions.       The patient was advised to call back or seek an in-person evaluation if the symptoms worsen or if the condition fails to improve as anticipated.    Kirtland Bouchard, MD

## 2021-03-04 ENCOUNTER — Other Ambulatory Visit: Payer: Self-pay

## 2021-03-04 ENCOUNTER — Encounter: Payer: Self-pay | Admitting: Internal Medicine

## 2021-03-04 ENCOUNTER — Ambulatory Visit (INDEPENDENT_AMBULATORY_CARE_PROVIDER_SITE_OTHER): Payer: PPO | Admitting: Internal Medicine

## 2021-03-04 VITALS — BP 130/68 | HR 71 | Temp 97.5°F | Resp 16 | Ht 64.0 in | Wt 117.0 lb

## 2021-03-04 DIAGNOSIS — Z1152 Encounter for screening for COVID-19: Secondary | ICD-10-CM

## 2021-03-04 DIAGNOSIS — J0410 Acute tracheitis without obstruction: Secondary | ICD-10-CM

## 2021-03-04 DIAGNOSIS — H6503 Acute serous otitis media, bilateral: Secondary | ICD-10-CM | POA: Diagnosis not present

## 2021-03-04 DIAGNOSIS — J029 Acute pharyngitis, unspecified: Secondary | ICD-10-CM | POA: Diagnosis not present

## 2021-03-04 LAB — POC COVID19 BINAXNOW: SARS Coronavirus 2 Ag: NEGATIVE

## 2021-03-04 MED ORDER — AZITHROMYCIN 250 MG PO TABS: ORAL_TABLET | ORAL | 1 refills | Status: DC

## 2021-03-04 MED ORDER — BENZONATATE 200 MG PO CAPS: ORAL_CAPSULE | ORAL | 1 refills | Status: DC

## 2021-03-04 MED ORDER — DEXAMETHASONE 2 MG PO TABS: ORAL_TABLET | ORAL | 0 refills | Status: DC

## 2021-03-05 ENCOUNTER — Ambulatory Visit: Payer: Self-pay | Admitting: Internal Medicine

## 2021-03-09 ENCOUNTER — Other Ambulatory Visit: Payer: Self-pay | Admitting: Internal Medicine

## 2021-03-09 DIAGNOSIS — J449 Chronic obstructive pulmonary disease, unspecified: Secondary | ICD-10-CM

## 2021-03-09 MED ORDER — ALBUTEROL SULFATE HFA 108 (90 BASE) MCG/ACT IN AERS: INHALATION_SPRAY | RESPIRATORY_TRACT | 3 refills | Status: DC

## 2021-03-10 NOTE — Telephone Encounter (Signed)
No turn calls from the patient

## 2021-03-27 ENCOUNTER — Ambulatory Visit (HOSPITAL_COMMUNITY)
Admission: RE | Admit: 2021-03-27 | Discharge: 2021-03-27 | Disposition: A | Discharge status: Home / Self Care | Payer: PPO | Source: Ambulatory Visit | Attending: Adult Health | Admitting: Adult Health

## 2021-03-27 ENCOUNTER — Encounter: Payer: Self-pay | Admitting: Adult Health

## 2021-03-27 ENCOUNTER — Other Ambulatory Visit: Payer: Self-pay

## 2021-03-27 ENCOUNTER — Encounter (HOSPITAL_COMMUNITY): Payer: Self-pay

## 2021-03-27 ENCOUNTER — Ambulatory Visit (INDEPENDENT_AMBULATORY_CARE_PROVIDER_SITE_OTHER): Payer: PPO | Admitting: Adult Health

## 2021-03-27 ENCOUNTER — Telehealth: Payer: Self-pay | Admitting: Adult Health

## 2021-03-27 ENCOUNTER — Emergency Department (HOSPITAL_COMMUNITY)
Admission: EM | Admit: 2021-03-27 | Discharge: 2021-03-27 | Disposition: A | Discharge status: Home / Self Care | Payer: PPO | Attending: Emergency Medicine | Admitting: Emergency Medicine

## 2021-03-27 ENCOUNTER — Emergency Department (HOSPITAL_COMMUNITY): Payer: PPO

## 2021-03-27 VITALS — BP 124/80 | HR 80 | Temp 97.5°F | Wt 116.0 lb

## 2021-03-27 DIAGNOSIS — R059 Cough, unspecified: Secondary | ICD-10-CM | POA: Insufficient documentation

## 2021-03-27 DIAGNOSIS — I10 Essential (primary) hypertension: Secondary | ICD-10-CM | POA: Insufficient documentation

## 2021-03-27 DIAGNOSIS — Z20822 Contact with and (suspected) exposure to covid-19: Secondary | ICD-10-CM | POA: Diagnosis not present

## 2021-03-27 DIAGNOSIS — R079 Chest pain, unspecified: Secondary | ICD-10-CM

## 2021-03-27 DIAGNOSIS — R5383 Other fatigue: Secondary | ICD-10-CM

## 2021-03-27 DIAGNOSIS — Z79899 Other long term (current) drug therapy: Secondary | ICD-10-CM | POA: Insufficient documentation

## 2021-03-27 DIAGNOSIS — R0602 Shortness of breath: Secondary | ICD-10-CM | POA: Insufficient documentation

## 2021-03-27 DIAGNOSIS — E039 Hypothyroidism, unspecified: Secondary | ICD-10-CM | POA: Diagnosis not present

## 2021-03-27 DIAGNOSIS — R0789 Other chest pain: Secondary | ICD-10-CM

## 2021-03-27 DIAGNOSIS — J4542 Moderate persistent asthma with status asthmaticus: Secondary | ICD-10-CM | POA: Insufficient documentation

## 2021-03-27 DIAGNOSIS — Z86718 Personal history of other venous thrombosis and embolism: Secondary | ICD-10-CM

## 2021-03-27 DIAGNOSIS — R7989 Other specified abnormal findings of blood chemistry: Secondary | ICD-10-CM | POA: Diagnosis not present

## 2021-03-27 LAB — CBC WITH DIFFERENTIAL/PLATELET
Absolute Monocytes: 887 cells/uL (ref 200–950)
Basophils Absolute: 52 cells/uL (ref 0–200)
Basophils Relative: 0.9 %
Eosinophils Absolute: 383 cells/uL (ref 15–500)
Eosinophils Relative: 6.6 %
HCT: 43.8 % (ref 35.0–45.0)
Hemoglobin: 14.7 g/dL (ref 11.7–15.5)
Lymphs Abs: 986 cells/uL (ref 850–3900)
MCH: 30.1 pg (ref 27.0–33.0)
MCHC: 33.6 g/dL (ref 32.0–36.0)
MCV: 89.8 fL (ref 80.0–100.0)
MPV: 9.2 fL (ref 7.5–12.5)
Monocytes Relative: 15.3 %
Neutro Abs: 3492 cells/uL (ref 1500–7800)
Neutrophils Relative %: 60.2 %
Platelets: 262 10*3/uL (ref 140–400)
RBC: 4.88 10*6/uL (ref 3.80–5.10)
RDW: 12.5 % (ref 11.0–15.0)
Total Lymphocyte: 17 %
WBC: 5.8 10*3/uL (ref 3.8–10.8)

## 2021-03-27 LAB — RESP PANEL BY RT-PCR (FLU A&B, COVID) ARPGX2
Influenza A by PCR: NEGATIVE
Influenza B by PCR: NEGATIVE
SARS Coronavirus 2 by RT PCR: NEGATIVE

## 2021-03-27 LAB — COMPLETE METABOLIC PANEL WITH GFR
AG Ratio: 1.9 (calc) (ref 1.0–2.5)
ALT: 13 U/L (ref 6–29)
AST: 21 U/L (ref 10–35)
Albumin: 3.9 g/dL (ref 3.6–5.1)
Alkaline phosphatase (APISO): 59 U/L (ref 37–153)
BUN: 13 mg/dL (ref 7–25)
CO2: 31 mmol/L (ref 20–32)
Calcium: 9.5 mg/dL (ref 8.6–10.4)
Chloride: 99 mmol/L (ref 98–110)
Creat: 0.86 mg/dL (ref 0.60–1.00)
Globulin: 2.1 g/dL (calc) (ref 1.9–3.7)
Glucose, Bld: 90 mg/dL (ref 65–99)
Potassium: 5 mmol/L (ref 3.5–5.3)
Sodium: 136 mmol/L (ref 135–146)
Total Bilirubin: 0.6 mg/dL (ref 0.2–1.2)
Total Protein: 6 g/dL — ABNORMAL LOW (ref 6.1–8.1)
eGFR: 70 mL/min/{1.73_m2} (ref >=60)

## 2021-03-27 LAB — URINALYSIS W MICROSCOPIC + REFLEX CULTURE
Bacteria, UA: NONE SEEN /HPF
Bilirubin Urine: NEGATIVE
Glucose, UA: NEGATIVE
Hgb urine dipstick: NEGATIVE
Hyaline Cast: NONE SEEN /LPF
Ketones, ur: NEGATIVE
Leukocyte Esterase: NEGATIVE
Nitrites, Initial: NEGATIVE
Protein, ur: NEGATIVE
Specific Gravity, Urine: 1.01 (ref 1.001–1.035)
Squamous Epithelial / HPF: NONE SEEN /HPF (ref <=5)
WBC, UA: NONE SEEN /HPF (ref 0–5)
pH: 7.5 (ref 5.0–8.0)

## 2021-03-27 LAB — TROPONIN I (HIGH SENSITIVITY)
Troponin I (High Sensitivity): 19 ng/L — ABNORMAL HIGH (ref <18)
Troponin I (High Sensitivity): 17 ng/L (ref <18)

## 2021-03-27 LAB — NO CULTURE INDICATED

## 2021-03-27 LAB — D-DIMER, QUANTITATIVE: D-Dimer, Quant: 1.02 mcg/mL FEU — ABNORMAL HIGH (ref <0.50)

## 2021-03-27 MED ORDER — IOHEXOL 350 MG/ML SOLN
80.0000 mL | Freq: Once | INTRAVENOUS | Status: AC | PRN
  Administered 2021-03-27: 80 mL via INTRAVENOUS

## 2021-03-27 MED ORDER — ASPIRIN 81 MG PO CHEW
324.0000 mg | CHEWABLE_TABLET | Freq: Once | ORAL | Status: AC
  Administered 2021-03-27: 324 mg via ORAL
  Filled 2021-03-27: qty 4

## 2021-03-27 NOTE — ED Provider Notes (Signed)
Emergency Medicine Provider Triage Evaluation Note  Shannon Parsons , a 80 y.o. female  was evaluated in triage.  Pt complains of intermittent chest pain that radiates to her back x4 days.  Chest pain associated with shortness of breath. Describes back pain as a pressure-like sensation. Patient was evaluated at her PCP earlier today where a D-dimer was obtained which was elevated.  She was sent to the ED to rule out PE.  Patient admits to history of blood clots thought to related to chemotherapy.  She is not currently on any anticoagulants. CXR performed earlier today as well, but has not been read by a radiologist yet.   Review of Systems  Positive: Chest pain, shortness of breat Negative: fever  Physical Exam  BP (!) 161/87 (BP Location: Left Arm)   Pulse 84   Temp 98.3 F (36.8 C) (Oral)   Resp 16   Ht 5\' 4"  (1.626 m)   Wt 52.6 kg   LMP 09/14/2014   SpO2 100%   BMI 19.91 kg/m  Gen:   Awake, no distress   Resp:  Normal effort  MSK:   Moves extremities without difficulty  Other:    Medical Decision Making  Medically screening exam initiated at 7:17 PM.  Appropriate orders placed.  [name] was informed that the remainder of the evaluation will be completed by another provider, this initial triage assessment does not replace that evaluation, and the importance of remaining in the ED until their evaluation is complete.  Reviewed labs from today. Troponin added. CXR done earlier today.   Karie Kirks 03/27/21 Dwan Bolt, MD 03/31/21 [phone]

## 2021-03-27 NOTE — Discharge Instructions (Addendum)
If you develop recurrent, continued, or worsening chest pain, shortness of breath, fever, vomiting, abdominal or back pain, or any other new/concerning symptoms then return to the ER for evaluation.  

## 2021-03-27 NOTE — Progress Notes (Signed)
Assessment and Plan:  Shannon Parsons was seen today for acute visit.  Diagnoses and all orders for this visit:  Chest tightness Fatigue, unspecified type Increased pulse Cough Complex pulm hx, gradual/progressive onset, appears stable in office today, no evidence of acute distress EKG in office WNL, no evidence of strain/MI/ST changes, I discussed limited ability to r/o cardiac etiology, Strong ED precautions if any worse/unease Will check STAT D dimer due to clot hx however low suspicion Check labs, CBC, CXR - [facility], consider CT in unclear however with weekend will not be able to coordinate until next week  If labs/CXR unremarkable plan to try alternative abx, given breztri sample to try, may need to follow up pulm with her hx, further plan pending labs/imaging results Could be element of anxiety; briefly discussed possible SSRI script, will follow up with status next week -     DG Chest 2 View; Future -     CBC with Differential/Platelet -     COMPLETE METABOLIC PANEL WITH GFR -     Urinalysis w microscopic + reflex cultur -     EKG 12-Lead -     D-dimer, quantitative  Personal history of DVT (deep vein thrombosis) -     D-dimer, quantitative  Further disposition pending results of labs. Discussed med's effects and SE's.   Over 30 minutes of exam, counseling, chart review, and critical decision making was performed.   Future Appointments  Date Time Provider Antoine  05/13/2021 10:30 AM Shannon Comber, NP [facility] None  07/01/2021 10:30 AM Dohmeier, Asencion Partridge, MD [facility] None  07/30/2021 11:30 AM [facility] None  07/30/2021 12:00 PM Ennever, Rudell Cobb, MD [facility] None  11/17/2021 11:00 AM Unk Pinto, MD [facility] None    ------------------------------------------------------------------------------------------------------------------   HPI BP 124/80   Pulse 80   Temp (!) 97.5 F (36.4 C)   Wt 116 lb (52.6 kg)   LMP 09/14/2014   SpO2 98%   BMI 19.91 kg/m   80 y.o.female with hx of aortic atherosclerosis, htn, chronic lung disease post chemo/bronchiectasis (has seen pulm in the past but not recent), OSA, hypogammaglobulinemia, anxiety, DVT, mantle cell lymphoma presents for evaluation of several weeks of dyspnea and newly reporting chest heaviness this AM.   She reports sx began around 03/01/2021 with irritation in throat. She was seen 03/04/2021 for "tracheitis" following URI sx and was treated with course of dexamethasone and zpak by Dr. Melford Aase. She reports did seem to improve sx somewhat but was lingering and notes in the last 5 days sx seem to be worsening again. Didn't tolerate steroid well.   She reports "no energy" and "I feel like I've been hit." She describes "fullness" in her chest. She does note stress and feels this is contributing (worse in the last year r/t husband's recently deceased uncle). She reports feels like a vice around her chest, "just can't seem to take a full breath." She has tried albuterol (minimal benefit). She notes mild cough. Non-productive. She denies fever/chills. Denies significant dyspnea, is able to ambulate. Speaks in complete sentences. Denies palpitations, skipped beats, edema, rapid breathing, neck or extremity pain. She feels off balance. She feels heart rate mildly but persistently faster than normal, typically in 60s, recently in 80s but up to 110 at one point. No aggravating factors, no alleviating factors. She thinks this is related to her lungs or anxiety/stress.   She has hx of DVT, reports was advised chemotherapy induced.  Denies recent travel.    CBC Latest Ref  Rng & Units 01/27/2021 11/10/2020 07/30/2020  WBC 4.0 - 10.5 K/uL 6.4 5.1 7.8  Hemoglobin 12.0 - 15.0 g/dL 14.2 14.1 14.3  Hematocrit 36.0 - 46.0 % 44.3 42.7 45.2  Platelets 150 - 400 K/uL 198 207 216     Past Medical History:  Diagnosis Date   Adenomatous colon polyp 1994   Asthma    Cataract 2013   bilateral eyes   Diverticulosis     Family history of ischemic heart disease    Fibrocystic breast disease    GERD (gastroesophageal reflux disease)    Hiatal hernia    Hyperlipidemia    Hypertension    pt denies - 01/16/18   IBS (irritable bowel syndrome)    Internal hemorrhoids    Mantle cell lymphoma ([place]) 10/18/2014   Melanoma ([place])    Facial, pt denies on 01/16/18   OSA (obstructive sleep apnea)    Osteoarthritis    Osteoporosis    Prediabetes    Unspecified hypothyroidism    Vitamin D deficiency      Allergies  Allergen Reactions   Alendronate Other (See Comments)    "made me choke"     Risedronate Sodium Other (See Comments)    "made me choke"     Sulfa Antibiotics Other (See Comments)    Doesn't remember    Sulfasalazine Other (See Comments)    Doesn't remember    Other Rash   Tetracyclines & Related Rash    Current Outpatient Medications on File Prior to Visit  Medication Sig   acidophilus (RISAQUAD) CAPS capsule Take 1 capsule by mouth daily.    albuterol (VENTOLIN HFA) 108 (90 Base) MCG/ACT inhaler Use 2 Inhalations 15 minutes apart every 4 hours as needed to Rescue Asthma   benzonatate (TESSALON) 200 MG capsule Take 1 perle 3 x / day to prevent cough   Cholecalciferol (VITAMIN D3) 50 MCG (2000 UT) capsule Take 4,000 Units by mouth daily.    COVID-19 mRNA vaccine, Pfizer, 30 MCG/0.3ML injection USE AS DIRECTED   hyoscyamine (LEVSIN SL) 0.125 MG SL tablet Take    1 to 2 tablets     every 4 hours       as needed for Nausea, Cramping, Bloating or Diarrhea DIARRHEA   levothyroxine (SYNTHROID) 50 MCG tablet Take  1 tablet  Daily  on an empty stomach with only water for 30 minutes & no Antacid meds, Calcium or Magnesium for 4 hours & avoid Biotin   rosuvastatin (CRESTOR) 5 MG tablet Take  1/4 tablet  Daily  for Cholesterol   temazepam (RESTORIL) 15 MG capsule Take 1 capsule (15 mg total) by mouth at bedtime as needed for sleep.   azithromycin (ZITHROMAX) 250 MG tablet Take 2 tablets with Food on  Day 1,  then 1 tablet Daily with Food for Sinusitis / Bronchitis   dexamethasone (DECADRON) 2 MG tablet Take 1 tab 3 x day - 3 days, then 2 x day - 3 days, then 1 tab daily   LORazepam (ATIVAN) 0.5 MG tablet lorazepam 0.5 mg tablet  TAKE 1 TABLET BY MOUTH EVERY 8 HOURS AS NEEDED FOR SLEEP (NAUSEA OR VOMITING). (Patient not taking: No sig reported)   No current facility-administered medications on file prior to visit.    ROS: all negative except above.   Physical Exam:  BP 124/80   Pulse 80   Temp (!) 97.5 F (36.4 C)   Wt 116 lb (52.6 kg)   LMP 09/14/2014   SpO2 98%  BMI 19.91 kg/m   General Appearance: Well nourished, well dressed elder female, in no acute distress. Speaks in complete sentences.  Eyes: PERRLA, EOMs, conjunctiva no swelling or erythema Sinuses: No Frontal/maxillary tenderness ENT/Mouth: Ext aud canals clear, TMs without erythema, bulging. No erythema, swelling, or exudate on post pharynx.  Tonsils not swollen or erythematous. Hearing normal.  Neck: Supple, thyroid normal.  Respiratory: Respiratory effort normal, normal rate, BS somewhat diminished throughout, somewhat limited expansion, no accessory muscle use. Faint scattered rhonchi without rales, wheezing or stridor.  Cardio: RRR with no MRGs. Brisk peripheral pulses without edema.  Abdomen: Soft, + BS.  Non tender, no guarding, rebound, hernias, masses. Lymphatics: Non tender without lymphadenopathy.  Musculoskeletal: No obvious deformity, symmetrical strength, mild kyphosis steady gait.  Skin: Warm, dry without rashes, lesions, ecchymosis.  Neuro: Cranial nerves intact. Normal muscle tone, no cerebellar symptoms. Sensation intact. Baseline head and vocal tremor.  Psych: Awake and oriented X 3, anxious affect, Insight and Judgment appropriate.     Izora Ribas, NP 10:16 AM [facility]

## 2021-03-27 NOTE — Patient Instructions (Signed)
Please get chest xray at [facility] - enter through main entrance and ask for imaging department  Can try breztri 2 puffs twice a day, - be sure to rinse mouth well after this to avoid thrush  If any worse or feeling that something is not right please go to the ER  May need to follow up with lung doctor if breathing symptoms are persistent  If all labs are negative and anxiety continues please let me know and we will plan to try a low dose of lexapro (escitalopram) which is excellent for anxiety, a safe daily medication that works slowly - please keep in mind this will take 8-12 weeks for full benefit        Shortness of Breath, Adult Shortness of breath is when a person has trouble breathing enough air or when a person feels like she or he is having trouble breathing in enough air.Shortness of breath could be a sign of a medical problem. Follow these instructions at home:  Pay attention to any changes in your symptoms. Do not use any products that contain nicotine or tobacco, such as cigarettes, e-cigarettes, and chewing tobacco. Do not smoke. Smoking is a common cause of shortness of breath. If you need help quitting, ask your health care provider. Avoid things that can irritate your airways, such as: Mold. Dust. Air pollution. Chemical fumes. Things that can cause allergy symptoms (allergens), if you have allergies. Keep your living space clean and free of mold and dust. Rest as needed. Slowly return to your usual activities. Take over-the-counter and prescription medicines only as told by your health care provider. This includes oxygen therapy and inhaled medicines. Keep all follow-up visits as told by your health care provider. This is important. Contact a health care provider if: Your condition does not improve as soon as expected. You have a hard time doing your normal activities, even after you rest. You have new symptoms. Get help right away if: Your shortness  of breath gets worse. You have shortness of breath when you are resting. You feel light-headed or you faint. You have a cough that is not controlled with medicines. You cough up blood. You have pain with breathing. You have pain in your chest, arms, shoulders, or abdomen. You have a fever. You cannot walk up stairs or exercise the way that you normally do. These symptoms may represent a serious problem that is an emergency. Do not wait to see if the symptoms will go away. Get medical help right away. Call your local emergency services (911 in the [place] Do not drive yourself to the hospital. Summary Shortness of breath is when a person has trouble breathing enough air. It can be a sign of a medical problem. Avoid things that irritate your lungs, such as smoking, pollution, mold, and dust. Pay attention to changes in your symptoms and contact your health care provider if you have a hard time completing daily activities because of shortness of breath. This information is not intended to replace advice given to you by your health care provider. Make sure you discuss any questions you have with your healthcare provider. Document Revised: 01/30/2018 Document Reviewed: 01/30/2018 Elsevier Patient Education  2022 [employer].

## 2021-03-27 NOTE — ED Provider Notes (Signed)
[facility] DEPT Provider Note   CSN: [id number] Arrival date & time: 03/27/21  1809     History Chief Complaint  Patient presents with   Shortness of Breath   Back Pain   Chest Pain    Shannon Parsons is a 80 y.o. female.  HPI 80 year old female presents with chest pain and shortness of breath.  This been ongoing for for 5 days.  She has a mild cough.  The chest pain feels like a pressure that is also in her back and feels like there is a fullness sensation in her chest.  Nothing makes it better or worse besides taking Tylenol which does improve it.  Right now is a 5-6 out of 10.  Exertion or eating does not change it.  No fevers or vomiting.  No abdominal pain.  Her PCP did some labs and a D-dimer which was elevated and sent her here for a CT scan.  She has a remote history of a DVT.  Past Medical History:  Diagnosis Date   Adenomatous colon polyp 1994   Asthma    Cataract 2013   bilateral eyes   Diverticulosis    Family history of ischemic heart disease    Fibrocystic breast disease    GERD (gastroesophageal reflux disease)    Hiatal hernia    Hyperlipidemia    Hypertension    pt denies - 01/16/18   IBS (irritable bowel syndrome)    Internal hemorrhoids    Mantle cell lymphoma ([place]) 10/18/2014   Melanoma ([place])    Facial, pt denies on 01/16/18   OSA (obstructive sleep apnea)    Osteoarthritis    Osteoporosis    Prediabetes    Unspecified hypothyroidism    Vitamin D deficiency     Patient Active Problem List   Diagnosis Date Noted   Bronchiectasis with acute lower respiratory infection ([place]) 03/20/2020   OSA (obstructive sleep apnea) 03/20/2020   Moderate persistent asthma with status asthmaticus 03/20/2020   Chemotherapy-induced lung disease 03/20/2020   Hypothyroidism 10/04/2018   Abnormal glucose 10/04/2018   Labile hypertension 10/04/2018   Cervical dystonia 01/16/2018   History of lymphoma 10/20/2017   Sinusitis, maxillary,  chronic 10/19/2017   Atherosclerosis of aorta ([place]) by PET Scan 04/21/2018. 08/09/2017   Tremor 05/06/2016   Acquired hypogammaglobulinemia ([place]) 04/05/2016   Obstructive bronchiectasis ([place]) 03/18/2016   Body mass index (BMI) of 20.0-20.9 in adult 07/21/2015   History of DVT (deep vein thrombosis)    Normocytic anemia 01/04/2015   Mantle cell lymphoma ([place]) 10/18/2014   Medication management 07/15/2014   OSA and COPD overlap syndrome ([place]) 07/05/2014   Anxiety state 02/27/2014   Hyperlipidemia, mixed    Hypotension    GERD    Osteoporosis  (Refuses treatment)    Osteoarthritis    Vitamin D deficiency    IBS    Diverticulosis    History of colonic polyps 07/19/2011   Chronic obstructive airway disease with asthma ([place]) 03/16/2011    Past Surgical History:  Procedure Laterality Date   APPENDECTOMY     BUNIONECTOMY     CATARACT EXTRACTION, BILATERAL     IR REMOVAL TUN ACCESS W/ PORT W/O FL MOD SED  03/04/2020   KNEE ARTHROSCOPY     TONSILLECTOMY     UMBILICAL HERNIA REPAIR     VIDEO BRONCHOSCOPY Bilateral 10/12/2017   Procedure: VIDEO BRONCHOSCOPY WITHOUT FLUORO;  Surgeon: Tanda Rockers, MD;  Location: [facility] ENDOSCOPY;  Service: Endoscopy;  Laterality:  Bilateral;     OB History   No obstetric history on file.     Family History  Problem Relation Age of Onset   Emphysema Father    Heart disease Father    Heart attack Father 28       Died   Heart disease Brother 20   Rheum arthritis Sister    Hypertension Mother    Thyroid disease Mother    Colon cancer Neg Hx    Stomach cancer Neg Hx     Social History   Tobacco Use   Smoking status: Never   Smokeless tobacco: Never   Tobacco comments:    Never Used Tobacco  Vaping Use   Vaping Use: Never used  Substance Use Topics   Alcohol use: No    Alcohol/week: 0.0 standard drinks   Drug use: No    Home Medications Prior to Admission medications   Medication Sig Start Date End Date Taking? Authorizing Provider   acidophilus (RISAQUAD) CAPS capsule Take 1 capsule by mouth daily.     [provider]  albuterol (VENTOLIN HFA) 108 (90 Base) MCG/ACT inhaler Use 2 Inhalations 15 minutes apart every 4 hours as needed to Rescue Asthma 03/09/21   Unk Pinto, MD  benzonatate (TESSALON) 200 MG capsule Take 1 perle 3 x / day to prevent cough 03/04/21   Unk Pinto, MD  Cholecalciferol (VITAMIN D3) 50 MCG (2000 UT) capsule Take 4,000 Units by mouth daily.     [provider]  COVID-19 mRNA vaccine, Pfizer, 30 MCG/0.3ML injection USE AS DIRECTED 08/06/20 08/06/21  Carlyle Basques, MD  hyoscyamine (LEVSIN SL) 0.125 MG SL tablet Take    1 to 2 tablets     every 4 hours       as needed for Nausea, Cramping, Bloating or Diarrhea DIARRHEA 06/18/20   Unk Pinto, MD  levothyroxine (SYNTHROID) 50 MCG tablet Take  1 tablet  Daily  on an empty stomach with only water for 30 minutes & no Antacid meds, Calcium or Magnesium for 4 hours & avoid Biotin 01/22/21   Unk Pinto, MD  LORazepam (ATIVAN) 0.5 MG tablet lorazepam 0.5 mg tablet  TAKE 1 TABLET BY MOUTH EVERY 8 HOURS AS NEEDED FOR SLEEP (NAUSEA OR VOMITING). Patient not taking: No sig reported    [provider]  rosuvastatin (CRESTOR) 5 MG tablet Take  1/4 tablet  Daily  for Cholesterol 01/19/21   Unk Pinto, MD  temazepam (RESTORIL) 15 MG capsule Take 1 capsule (15 mg total) by mouth at bedtime as needed for sleep. 01/27/21   Volanda Napoleon, MD    Allergies    Alendronate, Risedronate sodium, Sulfa antibiotics, Sulfasalazine, Other, and Tetracyclines & related  Review of Systems   Review of Systems  Constitutional:  Negative for fever.  Respiratory:  Positive for cough and shortness of breath.   Cardiovascular:  Positive for chest pain. Negative for leg swelling.  Gastrointestinal:  Negative for abdominal pain.  Musculoskeletal:  Positive for back pain.  All other systems reviewed and are negative.  Physical  Exam Updated Vital Signs BP (!) 166/83   Pulse 70   Temp 98.3 F (36.8 C) (Oral)   Resp 15   Ht 5\' 4"  (1.626 m)   Wt 52.6 kg   LMP 09/14/2014   SpO2 98%   BMI 19.91 kg/m   Physical Exam Vitals and nursing note reviewed.  Constitutional:      General: She is not in acute distress.  Appearance: She is well-developed. She is not ill-appearing or diaphoretic.  HENT:     Head: Normocephalic and atraumatic.     Right Ear: External ear normal.     Left Ear: External ear normal.     Nose: Nose normal.  Eyes:     General:        Right eye: No discharge.        Left eye: No discharge.  Cardiovascular:     Rate and Rhythm: Normal rate and regular rhythm.     Heart sounds: Normal heart sounds.  Pulmonary:     Effort: Pulmonary effort is normal.     Breath sounds: Normal breath sounds.  Abdominal:     Palpations: Abdomen is soft.     Tenderness: There is no abdominal tenderness.  Musculoskeletal:     Comments: No back tenderness  Skin:    General: Skin is warm and dry.  Neurological:     Mental Status: She is alert.  Psychiatric:        Mood and Affect: Mood is not anxious.    ED Results / Procedures / Treatments   Labs (all labs ordered are listed, but only abnormal results are displayed) Labs Reviewed  TROPONIN I (HIGH SENSITIVITY) - Abnormal; Notable for the following components:      Result Value   Troponin I (High Sensitivity) 19 (*)    All other components within normal limits  RESP PANEL BY RT-PCR (FLU A&B, COVID) [id number]  TROPONIN I (HIGH SENSITIVITY)    EKG EKG Interpretation  Date/Time:  Friday March 27 2021 19:35:52 EDT Ventricular Rate:  68 PR Interval:  149 QRS Duration: 89 QT Interval:  396 QTC Calculation: 422 R Axis:   61 Text Interpretation: Sinus rhythm  poor data quality, please discard Confirmed by Sherwood Gambler [phone]) on 03/27/2021 8:05:05 PM  Radiology CT Angio Chest PE W and/or Wo Contrast  Result Date: 03/27/2021 CLINICAL DATA:   Concern for pulmonary embolism.  Positive D-dimer. EXAM: CT ANGIOGRAPHY CHEST WITH CONTRAST TECHNIQUE: Multidetector CT imaging of the chest was performed using the standard protocol during bolus administration of intravenous contrast. Multiplanar CT image reconstructions and MIPs were obtained to evaluate the vascular anatomy. CONTRAST:  55mL OMNIPAQUE IOHEXOL 350 MG/ML SOLN COMPARISON:  None. FINDINGS: Cardiovascular: No filling defects within the pulmonary arteries to suggest acute pulmonary embolism. Mediastinum/Nodes: No axillary or supraclavicular adenopathy. No mediastinal or hilar adenopathy. No pericardial fluid. Esophagus normal. Lungs/Pleura: No pulmonary infarction. No pneumonia. No pleural fluid or pneumothorax. Upper Abdomen: Limited view of the liver, kidneys, pancreas are unremarkable. Normal adrenal glands. Musculoskeletal: No aggressive osseous lesion. Review of the MIP images confirms the above findings. IMPRESSION: 1. No evidence acute pulmonary embolism. 2. No acute pulmonary parenchymal findings. Electronically Signed   By: Suzy Bouchard M.D.   On: 03/27/2021 21:10    Procedures Procedures   Medications Ordered in ED Medications  iohexol (OMNIPAQUE) 350 MG/ML injection 80 mL (80 mLs Intravenous Contrast Given 03/27/21 2052)  aspirin chewable tablet 324 mg (324 mg Oral Given 03/27/21 2252)    ED Course  I have reviewed the triage vital signs and the nursing notes.  Pertinent labs & imaging results that were available during my care of the patient were reviewed by me and considered in my medical decision making (see chart for details).    MDM Rules/Calculators/A&P  Patient's CTA is negative for acute PE or other acute pulmonary process. ECG without acute ischemia.  First troponin is 19.  I had a discussion with patient about this and she does not want to be admitted for observation, echo, etc.  Second troponin is 17 which is essentially flat.  She  states her chest pain is actually no longer present.  My suspicion that she actually has ACS causing her 4 days of chest pain is pretty low but I did discuss that we should get a work-up.  She will need to follow-up closely with her PCP since she does not want to be admitted.  Given return precautions. Final Clinical Impression(s) / ED Diagnoses Final diagnoses:  Nonspecific chest pain    Rx / DC Orders ED Discharge Orders     None        Sherwood Gambler, MD 03/27/21 2349

## 2021-03-27 NOTE — ED Triage Notes (Signed)
Patient c/o SOB, chest pressure and back pain x 4 days. Patient went to her PCP and had a CXR done today.  Patient states that she was told to come to the ED to r/o a blood clot.

## 2021-03-27 NOTE — Telephone Encounter (Signed)
Provider spoke with patient evaluated today for progressive cough and to advise elevated D dimer - cannot rule out PE. Advised she present to ED for STAT CTA to rule out. She expresses her understanding and states intention to present immediately to [facility] or [facility] ED.

## 2021-03-28 DIAGNOSIS — G4733 Obstructive sleep apnea (adult) (pediatric): Secondary | ICD-10-CM | POA: Diagnosis not present

## 2021-03-30 ENCOUNTER — Other Ambulatory Visit: Payer: Self-pay | Admitting: Adult Health

## 2021-03-30 ENCOUNTER — Telehealth: Payer: Self-pay | Admitting: *Deleted

## 2021-03-30 DIAGNOSIS — R0789 Other chest pain: Secondary | ICD-10-CM

## 2021-03-30 NOTE — Telephone Encounter (Signed)
Received call from patient stating that she was in the [facility] ED this weekend with elevated Ddimer, CT angio was negative but Troponin slightly elevated. Spoke with Dr Marin Olp who suggested she contact primary care to let them know.

## 2021-04-10 ENCOUNTER — Encounter: Payer: Self-pay | Admitting: Adult Health

## 2021-04-10 ENCOUNTER — Other Ambulatory Visit: Payer: Self-pay

## 2021-04-10 ENCOUNTER — Ambulatory Visit (INDEPENDENT_AMBULATORY_CARE_PROVIDER_SITE_OTHER): Payer: PPO | Admitting: Adult Health

## 2021-04-10 VITALS — BP 128/82 | HR 86 | Temp 97.7°F | Ht 64.0 in | Wt 115.0 lb

## 2021-04-10 DIAGNOSIS — R0789 Other chest pain: Secondary | ICD-10-CM | POA: Diagnosis not present

## 2021-04-10 DIAGNOSIS — R053 Chronic cough: Secondary | ICD-10-CM | POA: Diagnosis not present

## 2021-04-10 DIAGNOSIS — R5383 Other fatigue: Secondary | ICD-10-CM

## 2021-04-10 MED ORDER — PROMETHAZINE-DM 6.25-15 MG/5ML PO SYRP: 5.0000 mL | ORAL_SOLUTION | Freq: Four times a day (QID) | ORAL | 1 refills | Status: DC | PRN

## 2021-04-10 NOTE — Progress Notes (Signed)
Assessment and Plan:  Shannon Parsons was seen today for shortness of breath, balance issues and cough.  Diagnoses and all orders for this visit:  Persistent dry cough Benign exam, from history ? Post-viral cough syndrome get on PPI, may need to add H2/refer ENT.  Voice rest, suppress cough with OTC sugar free candy and RX med, push fluids Not on ACEi or BB Follow up if not improving in 2 weeks or with any progression of sx -     promethazine-dextromethorphan (PROMETHAZINE-DM) 6.25-15 MG/5ML syrup; Take 5 mLs by mouth 4 (four) times daily as needed for cough.  Chest wall tenderness/ Costochondritis Likley secondary to coughing; see cough plan  Antiinflammatories, ice;   Atypical chest pain Has been referred to cardiology Dr. Percival Spanish for workup due to atypical chest pain and mild high sensitivity troponin  Today denies chest pressure, dyspnea, non-exertional, I have low suspicion of cardiology at this time, but advised to keep appointment.  Go to the ER if any worsening/persistent chest pain, shortness of breath, severe fatigue, syncope.    Further disposition pending results of labs. Discussed med's effects and SE's.   Over 30 minutes of exam, counseling, chart review, and critical decision making was performed.   Future Appointments  Date Time Provider [place]  05/06/2021 10:30 AM Tanda Rockers, MD [facility] None  05/13/2021 10:30 AM Liane Comber, NP [facility] None  06/04/2021  8:00 AM Minus Breeding, MD [facility]  07/01/2021 10:30 AM Dohmeier, Asencion Partridge, MD [facility] None  07/30/2021 11:30 AM [facility] None  07/30/2021 12:00 PM Volanda Napoleon, MD [facility] None  11/17/2021 11:00 AM Unk Pinto, MD [facility] None    ------------------------------------------------------------------------------------------------------------------   HPI BP 128/82   Pulse 86   Temp 97.7 F (36.5 C)   Ht '5\' 4"'$  (1.626 m)   Wt 115 lb (52.2 kg)   LMP 09/14/2014    SpO2 99%   BMI 19.74 kg/m  80 y.o.female hx of aortic atherosclerosis, htn, chronic lung disease post chemo/bronchiectasis (has seen pulm in the past but not recent), OSA, hypogammaglobulinemia, anxiety, DVT, mantle cell lymphoma presents for persistent cough, intermittent non-exertional chest discomfort, fatigue (gets tired doing activities and will feel off balance.)  She reports cough originally began back in 03/01/2021 with irritation in throat. She was seen 03/04/2021 for "tracheitis" following URI sx and was treated with course of dexamethasone and zpak by Dr. Melford Aase. She reports did seem to improve sx somewhat but was lingering and presented for evaluation due to chest tightness/fatigue on 03/27/2021, had normal EKG, negative CXR but + D dimer and was referred to ED where chest CTA was negative, however had mild elevated high sensitivity troponin of 19. Recheck was flat at 101 and she declined admission for workup, has been referred to cardiology Dr. Percival Spanish and pending.   She presents today again for persistent cough. She again describes chest discomfort, intermittently vise around her chest but non-exertional, more typically describes as "brief" "spasm" like. She denies wheezing, dyspnea. Endorses fatigue but hasn't been sleeping well with cough. She has not taken anything for cough. She has hx of asthma, but describes current as tickle in throat. Denies congestion or post nasal drip. She reports back massage by husband helped with chest aching and tightness.    Past Medical History:  Diagnosis Date   Adenomatous colon polyp 1994   Asthma    Cataract 2013   bilateral eyes   Diverticulosis    Family history of ischemic heart disease  Fibrocystic breast disease    GERD (gastroesophageal reflux disease)    Hiatal hernia    Hyperlipidemia    Hypertension    pt denies - 01/16/18   IBS (irritable bowel syndrome)    Internal hemorrhoids    Mantle cell lymphoma ([place]) 10/18/2014   Melanoma  ([place])    Facial, pt denies on 01/16/18   OSA (obstructive sleep apnea)    Osteoarthritis    Osteoporosis    Prediabetes    Unspecified hypothyroidism    Vitamin D deficiency      Allergies  Allergen Reactions   Alendronate Other (See Comments)    "made me choke"     Risedronate Sodium Other (See Comments)    "made me choke"     Sulfa Antibiotics Other (See Comments)    Doesn't remember    Sulfasalazine Other (See Comments)    Doesn't remember    Other Rash   Tetracyclines & Related Rash    Current Outpatient Medications on File Prior to Visit  Medication Sig   acidophilus (RISAQUAD) CAPS capsule Take 1 capsule by mouth daily.    albuterol (VENTOLIN HFA) 108 (90 Base) MCG/ACT inhaler Use 2 Inhalations 15 minutes apart every 4 hours as needed to Rescue Asthma   benzonatate (TESSALON) 200 MG capsule Take 1 perle 3 x / day to prevent cough   Cholecalciferol (VITAMIN D3) 50 MCG (2000 UT) capsule Take 4,000 Units by mouth daily.    COVID-19 mRNA vaccine, Pfizer, 30 MCG/0.3ML injection USE AS DIRECTED   hyoscyamine (LEVSIN SL) 0.125 MG SL tablet Take    1 to 2 tablets     every 4 hours       as needed for Nausea, Cramping, Bloating or Diarrhea DIARRHEA   levothyroxine (SYNTHROID) 50 MCG tablet Take  1 tablet  Daily  on an empty stomach with only water for 30 minutes & no Antacid meds, Calcium or Magnesium for 4 hours & avoid Biotin   LORazepam (ATIVAN) 0.5 MG tablet lorazepam 0.5 mg tablet  TAKE 1 TABLET BY MOUTH EVERY 8 HOURS AS NEEDED FOR SLEEP (NAUSEA OR VOMITING).   rosuvastatin (CRESTOR) 5 MG tablet Take  1/4 tablet  Daily  for Cholesterol   temazepam (RESTORIL) 15 MG capsule Take 1 capsule (15 mg total) by mouth at bedtime as needed for sleep.   No current facility-administered medications on file prior to visit.   Allergies:  Allergies  Allergen Reactions   Alendronate Other (See Comments)    "made me choke"     Risedronate Sodium Other (See Comments)    "made me  choke"     Sulfa Antibiotics Other (See Comments)    Doesn't remember    Sulfasalazine Other (See Comments)    Doesn't remember    Other Rash   Tetracyclines & Related Rash   Medical History:  has Chronic obstructive airway disease with asthma ([place]); History of colonic polyps; Hyperlipidemia, mixed; Hypotension; GERD; Osteoporosis  (Refuses treatment); Osteoarthritis; Vitamin D deficiency; IBS; Diverticulosis; Anxiety state; OSA and COPD overlap syndrome ([place]); Medication management; Mantle cell lymphoma ([place]); Normocytic anemia; History of DVT (deep vein thrombosis); Body mass index (BMI) of 20.0-20.9 in adult; Obstructive bronchiectasis ([place]); Acquired hypogammaglobulinemia ([place]); Atherosclerosis of aorta ([place]) by PET Scan 04/21/2018.; Sinusitis, maxillary, chronic; Cervical dystonia; History of lymphoma; Tremor; Hypothyroidism; Abnormal glucose; Labile hypertension; Bronchiectasis with acute lower respiratory infection ([place]); OSA (obstructive sleep apnea); Moderate persistent asthma with status asthmaticus; and Chemotherapy-induced lung disease on their problem list. Surgical  History:  She  has a past surgical history that includes Appendectomy; Tonsillectomy; Bunionectomy; Knee arthroscopy; Umbilical hernia repair; Video bronchoscopy (Bilateral, 10/12/2017); Cataract extraction, bilateral; and IR REMOVAL TUN ACCESS W/ PORT W/O FL MOD SED (03/04/2020). Family History:  Herfamily history includes Emphysema in her father; Heart attack (age of onset: 75) in her father; Heart disease in her father; Heart disease (age of onset: 37) in her brother; Hypertension in her mother; Rheum arthritis in her sister; Thyroid disease in her mother. Social History:   reports that she has never smoked. She has never used smokeless tobacco. She reports that she does not drink alcohol and does not use drugs.   ROS: Review of Systems  Constitutional:  Positive for malaise/fatigue. Negative for chills, diaphoresis,  fever and weight loss.  HENT:  Negative for congestion, ear pain, hearing loss, sinus pain, sore throat and tinnitus.   Eyes: Negative.   Respiratory:  Positive for cough (irritation in throat). Negative for sputum production, shortness of breath and wheezing.   Cardiovascular:  Positive for chest pain (brief, spasm like, non-exertional). Negative for palpitations, orthopnea, leg swelling and PND.  Gastrointestinal:  Negative for abdominal pain, constipation, heartburn, nausea and vomiting.  Musculoskeletal:  Positive for back pain. Negative for falls and neck pain.  Skin:  Negative for rash.  Neurological:  Positive for headaches (mild wth coughing). Negative for dizziness.  Endo/Heme/Allergies:  Negative for environmental allergies.  Psychiatric/Behavioral:  Negative for substance abuse. The patient has insomnia. The patient is not nervous/anxious.     Physical Exam:  BP 128/82   Pulse 86   Temp 97.7 F (36.5 C)   Ht '5\' 4"'$  (1.626 m)   Wt 115 lb (52.2 kg)   LMP 09/14/2014   SpO2 99%   BMI 19.74 kg/m   General Appearance: Well nourished, well dressed fatigued appearing elder female in no apparent distress. Eyes: PERRL, conjunctiva no swelling or erythema Sinuses: No Frontal/maxillary tenderness ENT/Mouth: Ext aud canals clear, TMs without erythema, bulging. No erythema, swelling, or exudate on post pharynx.  Tonsils not swollen or erythematous. Hearing normal.  Neck: Supple, thyroid normal.  Respiratory: Respiratory effort normal, limited expansion, BS quiet throughout but equal bilaterally without rales, rhonchi, wheezing or stridor.  Cardio: RRR with no MRGs. Brisk peripheral pulses without edema.  Abdomen: Soft, + BS.  Non tender, no guarding, rebound, hernias, masses. Lymphatics: Non tender without lymphadenopathy.  Musculoskeletal: Symmetrical strength, normal gait. She does have some chest wall tenderness along sternal border.  Skin: Warm, dry without rashes, lesions,  ecchymosis.  Neuro: Normal muscle tone, no cerebellar symptoms. Sensation intact.  Psych: Awake and oriented X 3, depressed affect, Insight and Judgment appropriate.     Izora Ribas, NP 11:13 AM [facility]

## 2021-04-28 DIAGNOSIS — G4733 Obstructive sleep apnea (adult) (pediatric): Secondary | ICD-10-CM | POA: Diagnosis not present

## 2021-05-06 ENCOUNTER — Institutional Professional Consult (permissible substitution): Payer: PPO | Admitting: Internal Medicine

## 2021-05-12 NOTE — Progress Notes (Signed)
MEDICARE ANNUAL WELLNESS VISIT   Assessment:   Encounter for Medicare Annual Wellness Visit  Due Annually  Health Maintenance Reviewed  Healthy lifestyle reviewed and goals set   Labile hypertension - continue medications, DASH diet, exercise and monitor at home. Call if greater than 130/80.   Chronic obstructive airway disease with asthma ([place]) Continue follow up pulmonology  OSA (obstructive sleep apnea) Follow up pulmonology  Obstructive bronchiectasis ([place]) Continue follow up pulmonology  History of DVT Likely due to chemo/cancer, will monitor for signs, stay on bASA  Mantle cell lymphoma of intra-abdominal lymph nodes ([place]) Continue follow up oncology  Irritable bowel syndrome, unspecified type If not on benefiber then add it, decrease stress,  if any worsening symptoms, blood in stool, AB pain, etc call office  Diverticulosis of intestine without bleeding, unspecified intestinal tract location Increase fiber, if any worsening symptoms, blood in stool, AB pain, etc call office  Osteoporosis, unspecified osteoporosis type, unspecified pathological fracture presence Continue supplement  Osteoarthritis, unspecified osteoarthritis type, unspecified site monitor  Anxiety state continue medications, stress management techniques discussed, increase water, good sleep hygiene discussed, increase exercise, and increase veggies.   Mixed hyperlipidemia decrease fatty foods increase activity.   Normocytic anemia -     monitor  Acquired hypogammaglobulinemia ([place]) Continue follow up oncology  Atherosclerosis of aorta ([place]) by PET scan 04/21/18 Control blood pressure, cholesterol, glucose, increase exercise.  Appt with Dr. Percival Spanish 06/04/21  Essential tremor No worsening symptoms  OSA and COPD overlap syndrome ([place]) Continue follow up Dr. Brett Fairy  Over 30 minutes of counseling, chart review and critical decision making was performed Future Appointments  Date Time  Provider [place]  05/25/2021  9:15 AM Tanda Rockers, MD [facility] None  06/04/2021  8:00 AM Minus Breeding, MD [facility]  07/01/2021 10:30 AM Dohmeier, Asencion Partridge, MD GNA-GNA None  07/30/2021 11:30 AM [facility] None  07/30/2021 12:00 PM Ennever, Rudell Cobb, MD [facility] None  11/17/2021 11:00 AM Unk Pinto, MD [facility]GAA[facility] None  05/13/2022 10:00 AM Magda Bernheim, NP [facility] None    Plan:   During the course of the visit the patient was educated and counseled about appropriate screening and preventive services including:   Pneumococcal vaccine  Prevnar 13 Influenza vaccine Td vaccine Screening electrocardiogram Bone densitometry screening Colorectal cancer screening Diabetes screening Glaucoma screening Nutrition counseling  Advanced directives: requested   Subjective:  Shannon Parsons is a 80 y.o. female who presents for Medicare Annual Wellness Visit and follow up.    She still has constipation but states her stomach issues are better.  Her last colonoscopy was 2018, EGD 2015.  She is seeing Dr. Beacher May f, has OSA with CPAP  She will have nocturia 1-2. She gets very tired between 3-5 oclock.    Her blood pressure has been controlled at home, today their BP is BP: 126/76 BP Readings from Last 3 Encounters:  05/13/21 126/76  04/10/21 128/82  03/27/21 (!) 160/80    She does workout. She denies chest pain, shortness of breath, dizziness.    BMI is Body mass index is 20.05 kg/m., she has been working on diet and exercise. Wt Readings from Last 3 Encounters:  05/13/21 116 lb 12.8 oz (53 kg)  04/10/21 115 lb (52.2 kg)  03/27/21 116 lb (52.6 kg)    She has a history of Mantle cell lymphoma, thromboembolic right leg due to cancer/chemo that is resolved, and bronchiectasis and acquired hypogammaglobulinemia, she continues to follow with Dr. Marin Olp, and  is doing very well. Followups have been good  She is on CPAP, has chronic cough/sinusitis  following with Dr. Melvyn Novas and Constance Holster and has obstructive bronchiectasis, she states it is getting better.   She is not on cholesterol medication and denies myalgias. Her cholesterol is not at goal. The cholesterol last visit was:   Lab Results  Component Value Date   CHOL 174 11/10/2020   HDL 65 11/10/2020   LDLCALC 89 11/10/2020   TRIG 100 11/10/2020   CHOLHDL 2.7 11/10/2020   Last A1C Lab Results  Component Value Date   HGBA1C 5.5 11/10/2020   Last GFR: Lab Results  Component Value Date   GFRNONAA >60 01/27/2021   Patient is on Vitamin D supplement.   Lab Results  Component Value Date   VD25OH 67 11/10/2020     She is on thyroid medication. Her medication is 1 pill of 50 mcg daily Lab Results  Component Value Date   TSH 4.05 11/10/2020    Medication Review:  Current Outpatient Medications (Endocrine & Metabolic):    levothyroxine (SYNTHROID) 50 MCG tablet, Take  1 tablet  Daily  on an empty stomach with only water for 30 minutes & no Antacid meds, Calcium or Magnesium for 4 hours & avoid Biotin  Current Outpatient Medications (Cardiovascular):    rosuvastatin (CRESTOR) 5 MG tablet, Take  1/4 tablet  Daily  for Cholesterol  Current Outpatient Medications (Respiratory):    albuterol (VENTOLIN HFA) 108 (90 Base) MCG/ACT inhaler, Use 2 Inhalations 15 minutes apart every 4 hours as needed to Rescue Asthma   benzonatate (TESSALON) 200 MG capsule, Take 1 perle 3 x / day to prevent cough (Patient not taking: Reported on 05/13/2021)   promethazine-dextromethorphan (PROMETHAZINE-DM) 6.25-15 MG/5ML syrup, Take 5 mLs by mouth 4 (four) times daily as needed for cough. (Patient not taking: Reported on 05/13/2021)    Current Outpatient Medications (Other):    acidophilus (RISAQUAD) CAPS capsule, Take 1 capsule by mouth daily.    Cholecalciferol (VITAMIN D3) 50 MCG (2000 UT) capsule, Take 4,000 Units by mouth daily.    COVID-19 mRNA vaccine, Pfizer, 30 MCG/0.3ML injection, USE AS  DIRECTED   hyoscyamine (LEVSIN SL) 0.125 MG SL tablet, Take    1 to 2 tablets     every 4 hours       as needed for Nausea, Cramping, Bloating or Diarrhea DIARRHEA   LORazepam (ATIVAN) 0.5 MG tablet, lorazepam 0.5 mg tablet  TAKE 1 TABLET BY MOUTH EVERY 8 HOURS AS NEEDED FOR SLEEP (NAUSEA OR VOMITING).   temazepam (RESTORIL) 15 MG capsule, Take 1 capsule (15 mg total) by mouth at bedtime as needed for sleep.   Allergies  Allergen Reactions   Alendronate Other (See Comments)    "made me choke"     Risedronate Sodium Other (See Comments)    "made me choke"     Sulfa Antibiotics Other (See Comments)    Doesn't remember    Sulfasalazine Other (See Comments)    Doesn't remember    Other Rash   Tetracyclines & Related Rash    Current Problems (verified) Patient Active Problem List   Diagnosis Date Noted   Bronchiectasis with acute lower respiratory infection ([place]) 03/20/2020   OSA (obstructive sleep apnea) 03/20/2020   Moderate persistent asthma with status asthmaticus 03/20/2020   Chemotherapy-induced lung disease 03/20/2020   Hypothyroidism 10/04/2018   Abnormal glucose 10/04/2018   Labile hypertension 10/04/2018   Cervical dystonia 01/16/2018   History of lymphoma 10/20/2017  Sinusitis, maxillary, chronic 10/19/2017   Atherosclerosis of aorta ([place]) by PET Scan 04/21/2018. 08/09/2017   Tremor 05/06/2016   Acquired hypogammaglobulinemia ([place]) 04/05/2016   Obstructive bronchiectasis ([place]) 03/18/2016   Body mass index (BMI) of 20.0-20.9 in adult 07/21/2015   History of DVT (deep vein thrombosis)    Normocytic anemia 01/04/2015   Mantle cell lymphoma ([place]) 10/18/2014   Medication management 07/15/2014   OSA and COPD overlap syndrome ([place]) 07/05/2014   Anxiety state 02/27/2014   Hyperlipidemia, mixed    Hypotension    GERD    Osteoporosis  (Refuses treatment)    Osteoarthritis    Vitamin D deficiency    IBS    Diverticulosis    History of colonic polyps 07/19/2011    Chronic obstructive airway disease with asthma ([place]) 03/16/2011    Screening Tests Health Maintenance  Topic Date Due   COVID-19 Vaccine (4 - Booster for [place] series) 05/29/2021 (Originally 11/06/2020)   Zoster Vaccines- Shingrix (1 of 2) 08/12/2021 (Originally 08/17/1960)   TETANUS/TDAP  05/13/2022 (Originally 08/17/1960)   COLONOSCOPY (Pts 45-5yr Insurance coverage will need to be confirmed)  02/02/2022   DEXA SCAN  Completed   Hepatitis C Screening  Completed   PNA vac Low Risk Adult  Completed   HPV VACCINES  Aged Out   INFLUENZA VACCINE  Discontinued   Last colonoscopy: 2018 due 01/2022 EGD 2015 Last mammogram: 01/02/21 benign repeat 1 year Last pap smear/pelvic exam: 2010   DEXA: 12/13/19 osteoporosis repeat 2023 Bone marrow exam 2016 Ct sinus 10/2017 MRI brain w/wo 2019 Echo 02/2015 Holter 12/2017 Stress test 2015 Sleep study 11/30/2016  Prior vaccinations: TD or Tdap: declined  Influenza: declined Pneumococcal: 2014 Prevnar13: 2017 Shingles/Zostavax: 2014 Shingrix: will call insurance to see if covered Covid: P[place]2/2 and 1 booster, refuses any further Covid  Names of Other Physician/Practitioners you currently use: 1. [facility] here for primary care 2. [facility], eye doctor, last visit 06/14/2021 scheduled 3. [facility], 06/2021 scheduled Patient Care Team: MUnk Pinto MD as PCP - General (Internal Medicine) SLadene Artist MD as Consulting Physician (Gastroenterology) MSatira Sark MD as Consulting Physician (Cardiology) Clance, KArmando Reichert MD as Consulting Physician (Pulmonary Disease) EVolanda Napoleon MD as Consulting Physician (Oncology)  SURGICAL HISTORY She  has a past surgical history that includes Appendectomy; Tonsillectomy; Bunionectomy; Knee arthroscopy; Umbilical hernia repair; Video bronchoscopy (Bilateral, 10/12/2017); Cataract extraction, bilateral; and IR REMOVAL TUN ACCESS W/ PORT W/O FL  MOD SED (03/04/2020). FAMILY HISTORY Her family history includes Emphysema in her father; Heart attack (age of onset: 743 in her father; Heart disease in her father; Heart disease (age of onset: 633 in her brother; Hypertension in her mother; Rheum arthritis in her sister; Thyroid disease in her mother. SOCIAL HISTORY She  reports that she has never smoked. She has never used smokeless tobacco. She reports that she does not drink alcohol and does not use drugs.   MEDICARE WELLNESS OBJECTIVES: Physical activity: Current Exercise Habits: Home exercise routine, Type of exercise: walking, Time (Minutes): 45, Frequency (Times/Week): 4, Weekly Exercise (Minutes/Week): 180, Intensity: Mild, Exercise limited by: None identified Cardiac risk factors: Cardiac Risk Factors include: advanced age (>518m, >6>11omen);dyslipidemia Depression/mood screen:   [facility]  Decreased Interest 0  Down, Depressed, Hopeless 0  PHQ - 2 Score 0  Some recent data might be hidden    ADLs:  In your present state of health, do you have any difficulty performing the  following activities: 05/13/2021 11/09/2020  Hearing? N N  Vision? N N  Difficulty concentrating or making decisions? N N  Walking or climbing stairs? N N  Dressing or bathing? N N  Doing errands, shopping? N N  Some recent data might be hidden     Cognitive Testing  Alert? Yes  Normal Appearance?Yes  Oriented to person? Yes  Place? Yes   Time? Yes  Recall of three objects?  Yes  Can perform simple calculations? Yes  Displays appropriate judgment?Yes  Can read the correct time from a watch face?Yes  EOL planning: Does Patient Have a Medical Advance Directive?: Yes Type of Advance Directive: Healthcare Power of Attorney, Living will Does patient want to make changes to medical advance directive?: No - Patient declined Copy of [place] in Chart?: No - copy requested  Review of Systems  Constitutional:   Negative for chills, fever, malaise/fatigue and weight loss.  HENT:  Negative for congestion, hearing loss, sore throat and tinnitus.   Eyes: Negative.  Negative for blurred vision and double vision.  Respiratory:  Negative for cough, shortness of breath and wheezing.   Cardiovascular:  Negative for chest pain, palpitations, orthopnea and leg swelling.  Gastrointestinal:  Negative for abdominal pain, blood in stool, constipation, diarrhea, heartburn, melena, nausea and vomiting.  Genitourinary:  Negative for hematuria.  Musculoskeletal:  Negative for falls, joint pain and myalgias.  Skin: Negative.  Negative for rash.  Neurological:  Negative for dizziness, tingling, tremors, sensory change, loss of consciousness and headaches.  Psychiatric/Behavioral:  Negative for depression, memory loss and suicidal ideas. The patient is not nervous/anxious and does not have insomnia.     Objective:     Today's Vitals   05/13/21 1039  BP: 126/76  Pulse: 74  Resp: 16  Temp: (!) 97.3 F (36.3 C)  SpO2: 98%  Weight: 116 lb 12.8 oz (53 kg)  Height: '5\' 4"'$  (1.626 m)   Body mass index is 20.05 kg/m.  General Appearance: Frail appearing elder, in no apparent distress. Eyes: PERRLA, EOMs, conjunctiva no swelling or erythema Sinuses: No Frontal/maxillary tenderness ENT/Mouth: Ext aud canals clear, TMs without erythema, bulging. No erythema, swelling, or exudate on post pharynx.  Tonsils not swollen or erythematous. Hearing normal.  Neck: Supple, thyroid normal.  Respiratory: Respiratory effort normal, BS equal bilaterally without rales, rhonchi, wheezing or stridor.  Cardio: RRR with no MRGs. Portacath on left chest Abdomen: Soft, + BS.  + epigastric tenderness, no guarding, rebound, hernias, masses. Lymphatics: Non tender without lymphadenopathy.  Musculoskeletal: Full ROM, 5/5 strength, Normal gait Skin: Warm, dry without rashes, lesions, ecchymosis.  Neuro: Cranial nerves intact. No cerebellar  symptoms. Essential tremor noticed of head Psych: Awake and oriented X 3, normal affect, Insight and Judgment appropriate.  Medicare Attestation I have personally reviewed: The patient's medical and social history Their use of alcohol, tobacco or illicit drugs Their current medications and supplements The patient's functional ability including ADLs,fall risks, home safety risks, cognitive, and hearing and visual impairment Diet and physical activities Evidence for depression or mood disorders  The patient's weight, height, BMI, and visual acuity have been recorded in the chart.  I have made referrals, counseling, and provided education to the patient based on review of the above and I have provided the patient with a written personalized care plan for preventive services.     Magda Bernheim ANP-C  [facility]  05/13/2021

## 2021-05-13 ENCOUNTER — Ambulatory Visit (INDEPENDENT_AMBULATORY_CARE_PROVIDER_SITE_OTHER): Payer: PPO | Admitting: Nurse Practitioner

## 2021-05-13 ENCOUNTER — Other Ambulatory Visit: Payer: Self-pay

## 2021-05-13 ENCOUNTER — Encounter: Payer: Self-pay | Admitting: Nurse Practitioner

## 2021-05-13 VITALS — BP 126/76 | HR 74 | Temp 97.3°F | Resp 16 | Ht 64.0 in | Wt 116.8 lb

## 2021-05-13 DIAGNOSIS — D649 Anemia, unspecified: Secondary | ICD-10-CM | POA: Diagnosis not present

## 2021-05-13 DIAGNOSIS — J449 Chronic obstructive pulmonary disease, unspecified: Secondary | ICD-10-CM

## 2021-05-13 DIAGNOSIS — Z86718 Personal history of other venous thrombosis and embolism: Secondary | ICD-10-CM | POA: Diagnosis not present

## 2021-05-13 DIAGNOSIS — R7309 Other abnormal glucose: Secondary | ICD-10-CM

## 2021-05-13 DIAGNOSIS — K5790 Diverticulosis of intestine, part unspecified, without perforation or abscess without bleeding: Secondary | ICD-10-CM

## 2021-05-13 DIAGNOSIS — E039 Hypothyroidism, unspecified: Secondary | ICD-10-CM | POA: Diagnosis not present

## 2021-05-13 DIAGNOSIS — E782 Mixed hyperlipidemia: Secondary | ICD-10-CM

## 2021-05-13 DIAGNOSIS — D801 Nonfamilial hypogammaglobulinemia: Secondary | ICD-10-CM

## 2021-05-13 DIAGNOSIS — R6889 Other general symptoms and signs: Secondary | ICD-10-CM

## 2021-05-13 DIAGNOSIS — Z1389 Encounter for screening for other disorder: Secondary | ICD-10-CM

## 2021-05-13 DIAGNOSIS — E559 Vitamin D deficiency, unspecified: Secondary | ICD-10-CM

## 2021-05-13 DIAGNOSIS — Z0000 Encounter for general adult medical examination without abnormal findings: Secondary | ICD-10-CM

## 2021-05-13 DIAGNOSIS — I700 Atherosclerosis of aorta: Secondary | ICD-10-CM | POA: Diagnosis not present

## 2021-05-13 DIAGNOSIS — K219 Gastro-esophageal reflux disease without esophagitis: Secondary | ICD-10-CM | POA: Diagnosis not present

## 2021-05-13 DIAGNOSIS — G4733 Obstructive sleep apnea (adult) (pediatric): Secondary | ICD-10-CM | POA: Diagnosis not present

## 2021-05-13 DIAGNOSIS — R0989 Other specified symptoms and signs involving the circulatory and respiratory systems: Secondary | ICD-10-CM

## 2021-05-13 DIAGNOSIS — C8313 Mantle cell lymphoma, intra-abdominal lymph nodes: Secondary | ICD-10-CM

## 2021-05-13 DIAGNOSIS — G250 Essential tremor: Secondary | ICD-10-CM

## 2021-05-13 DIAGNOSIS — J479 Bronchiectasis, uncomplicated: Secondary | ICD-10-CM | POA: Diagnosis not present

## 2021-05-13 DIAGNOSIS — K589 Irritable bowel syndrome without diarrhea: Secondary | ICD-10-CM | POA: Diagnosis not present

## 2021-05-13 DIAGNOSIS — Z0001 Encounter for general adult medical examination with abnormal findings: Secondary | ICD-10-CM | POA: Diagnosis not present

## 2021-05-13 DIAGNOSIS — M810 Age-related osteoporosis without current pathological fracture: Secondary | ICD-10-CM | POA: Diagnosis not present

## 2021-05-13 DIAGNOSIS — F411 Generalized anxiety disorder: Secondary | ICD-10-CM

## 2021-05-13 NOTE — Patient Instructions (Signed)
Call your insurance company to see if they cover the Shingrix vaccine.  If they do it is available at [facility] or [facility]. Keep appointment with Dr. Percival Spanish in September  [place] what a healthy weight is for you (roughly BMI <25) and aim to maintain this   Aim for 7+ servings of fruits and vegetables daily   70-80+ fluid ounces of water or unsweet tea for healthy kidneys   Limit to max 1 drink of alcohol per day; avoid smoking/tobacco   Limit animal fats in diet for cholesterol and heart health - choose grass fed whenever available   Avoid highly processed foods, and foods high in saturated/trans fats   Aim for low stress - take time to unwind and care for your mental health   Aim for 150 min of moderate intensity exercise weekly for heart health, and weights twice weekly for bone health   Aim for 7-9 hours of sleep daily

## 2021-05-14 LAB — URINALYSIS, ROUTINE W REFLEX MICROSCOPIC
Bilirubin Urine: NEGATIVE
Glucose, UA: NEGATIVE
Hgb urine dipstick: NEGATIVE
Ketones, ur: NEGATIVE
Leukocytes,Ua: NEGATIVE
Nitrite: NEGATIVE
Protein, ur: NEGATIVE
Specific Gravity, Urine: 1.007 (ref 1.001–1.035)
pH: 7 (ref 5.0–8.0)

## 2021-05-14 LAB — HEMOGLOBIN A1C
Hgb A1c MFr Bld: 5.5 % of total Hgb (ref <5.7)
Mean Plasma Glucose: 111 mg/dL
eAG (mmol/L): 6.2 mmol/L

## 2021-05-14 LAB — CBC WITH DIFFERENTIAL/PLATELET
Absolute Monocytes: 814 cells/uL (ref 200–950)
Basophils Absolute: 83 cells/uL (ref 0–200)
Basophils Relative: 1.4 %
Eosinophils Absolute: 242 cells/uL (ref 15–500)
Eosinophils Relative: 4.1 %
HCT: 42.7 % (ref 35.0–45.0)
Hemoglobin: 14 g/dL (ref 11.7–15.5)
Lymphs Abs: 1298 cells/uL (ref 850–3900)
MCH: 28.9 pg (ref 27.0–33.0)
MCHC: 32.8 g/dL (ref 32.0–36.0)
MCV: 88.2 fL (ref 80.0–100.0)
MPV: 9.8 fL (ref 7.5–12.5)
Monocytes Relative: 13.8 %
Neutro Abs: 3463 cells/uL (ref 1500–7800)
Neutrophils Relative %: 58.7 %
Platelets: 229 10*3/uL (ref 140–400)
RBC: 4.84 10*6/uL (ref 3.80–5.10)
RDW: 12.4 % (ref 11.0–15.0)
Total Lymphocyte: 22 %
WBC: 5.9 10*3/uL (ref 3.8–10.8)

## 2021-05-14 LAB — VITAMIN D 25 HYDROXY (VIT D DEFICIENCY, FRACTURES): Vit D, 25-Hydroxy: 54 ng/mL (ref 30–100)

## 2021-05-14 LAB — COMPLETE METABOLIC PANEL WITH GFR
AG Ratio: 2.5 (calc) (ref 1.0–2.5)
ALT: 15 U/L (ref 6–29)
AST: 27 U/L (ref 10–35)
Albumin: 4.2 g/dL (ref 3.6–5.1)
Alkaline phosphatase (APISO): 49 U/L (ref 37–153)
BUN: 16 mg/dL (ref 7–25)
CO2: 30 mmol/L (ref 20–32)
Calcium: 9.6 mg/dL (ref 8.6–10.4)
Chloride: 100 mmol/L (ref 98–110)
Creat: 0.89 mg/dL (ref 0.60–1.00)
Globulin: 1.7 g/dL (calc) — ABNORMAL LOW (ref 1.9–3.7)
Glucose, Bld: 76 mg/dL (ref 65–99)
Potassium: 4.6 mmol/L (ref 3.5–5.3)
Sodium: 138 mmol/L (ref 135–146)
Total Bilirubin: 0.5 mg/dL (ref 0.2–1.2)
Total Protein: 5.9 g/dL — ABNORMAL LOW (ref 6.1–8.1)
eGFR: 67 mL/min/{1.73_m2} (ref >=60)

## 2021-05-14 LAB — TSH: TSH: 1.51 mIU/L (ref 0.40–4.50)

## 2021-05-14 LAB — LIPID PANEL
Cholesterol: 187 mg/dL (ref <200)
HDL: 67 mg/dL (ref >=50)
LDL Cholesterol (Calc): 102 mg/dL (calc) — ABNORMAL HIGH
Non-HDL Cholesterol (Calc): 120 mg/dL (calc) (ref <130)
Total CHOL/HDL Ratio: 2.8 (calc) (ref <5.0)
Triglycerides: 90 mg/dL (ref <150)

## 2021-05-14 LAB — MICROALBUMIN / CREATININE URINE RATIO
Creatinine, Urine: 32 mg/dL (ref 20–275)
Microalb Creat Ratio: 13 mcg/mg creat (ref <30)
Microalb, Ur: 0.4 mg/dL

## 2021-05-14 LAB — MAGNESIUM: Magnesium: 2.1 mg/dL (ref 1.5–2.5)

## 2021-05-25 ENCOUNTER — Institutional Professional Consult (permissible substitution): Payer: PPO | Admitting: Internal Medicine

## 2021-06-03 DIAGNOSIS — R072 Precordial pain: Secondary | ICD-10-CM | POA: Insufficient documentation

## 2021-06-03 NOTE — Progress Notes (Signed)
Cardiology Office Note   Date:  06/04/2021   ID:  Kimya, Mccahill [birth year], MRN [id number]  PCP:  Unk Pinto, MD  Cardiologist:   None Referring:  ED  Chief Complaint  Patient presents with   Chest Pain      History of Present Illness: Shannon Parsons is a 80 y.o. female who is referred by the ED for evaluation of chest pain.  The troponin was minimally elevated.  I was able to review the ED records for this visit.  A CT of the chest was done.  There was no evidence of pulmonary embolism.  In addition there was no mention of coronary calcium.     She has no prior cardiac stress test other than in 2015 she apparently had mantle cell lymphoma and had an echo during the course of her chemotherapy and I saw the results of this.  This was normal.  2016 there was some chest discomfort and she went to [facility].  She had a negative Lexiscan Myoview.  She had subsequent follow-up echocardiography.    She otherwise has felt well.  She thought that when she presented to the emergency room he might benefit beginnings of bronchitis.  She has since recovered.  She is very active.  She goes up and down stairs.  She does all her chores of daily living.  She denies chest pressure, neck or arm discomfort.  She does not have shortness of breath, PND or orthopnea.  She had no weight gain or edema.   Past Medical History:  Diagnosis Date   Adenomatous colon polyp 1994   Asthma    Cataract 2013   bilateral eyes   Diverticulosis    Family history of ischemic heart disease    Fibrocystic breast disease    GERD (gastroesophageal reflux disease)    Hiatal hernia    Hyperlipidemia    IBS (irritable bowel syndrome)    Internal hemorrhoids    Mantle cell lymphoma ([place]) 10/18/2014   Melanoma ([place])    Facial, pt denies on 01/16/18   OSA (obstructive sleep apnea)    Osteoarthritis    Osteoporosis    Prediabetes    Unspecified hypothyroidism    Vitamin D deficiency     Past  Surgical History:  Procedure Laterality Date   APPENDECTOMY     BUNIONECTOMY     CATARACT EXTRACTION, BILATERAL     IR REMOVAL TUN ACCESS W/ PORT W/O FL MOD SED  03/04/2020   KNEE ARTHROSCOPY     TONSILLECTOMY     UMBILICAL HERNIA REPAIR     VIDEO BRONCHOSCOPY Bilateral 10/12/2017   Procedure: VIDEO BRONCHOSCOPY WITHOUT FLUORO;  Surgeon: Tanda Rockers, MD;  Location: [facility] ENDOSCOPY;  Service: Endoscopy;  Laterality: Bilateral;     Current Outpatient Medications  Medication Sig Dispense Refill   albuterol (VENTOLIN HFA) 108 (90 Base) MCG/ACT inhaler Use 2 Inhalations 15 minutes apart every 4 hours as needed to Rescue Asthma 48 g 3   benzonatate (TESSALON) 200 MG capsule Take 1 perle 3 x / day to prevent cough 30 capsule 1   Cholecalciferol (VITAMIN D3) 50 MCG (2000 UT) capsule Take 4,000 Units by mouth daily.      COVID-19 mRNA vaccine, Pfizer, 30 MCG/0.3ML injection USE AS DIRECTED .3 mL 0   hyoscyamine (LEVSIN SL) 0.125 MG SL tablet Take    1 to 2 tablets     every 4 hours       as  needed for Nausea, Cramping, Bloating or Diarrhea DIARRHEA 90 tablet 0   levothyroxine (SYNTHROID) 50 MCG tablet Take  1 tablet  Daily  on an empty stomach with only water for 30 minutes & no Antacid meds, Calcium or Magnesium for 4 hours & avoid Biotin 90 tablet 3   LORazepam (ATIVAN) 0.5 MG tablet lorazepam 0.5 mg tablet  TAKE 1 TABLET BY MOUTH EVERY 8 HOURS AS NEEDED FOR SLEEP (NAUSEA OR VOMITING).     promethazine-dextromethorphan (PROMETHAZINE-DM) 6.25-15 MG/5ML syrup Take 5 mLs by mouth 4 (four) times daily as needed for cough. 240 mL 1   rosuvastatin (CRESTOR) 5 MG tablet Take  1/4 tablet  Daily  for Cholesterol 23 tablet 1   temazepam (RESTORIL) 15 MG capsule Take 1 capsule (15 mg total) by mouth at bedtime as needed for sleep. 30 capsule 0   acidophilus (RISAQUAD) CAPS capsule Take 1 capsule by mouth daily.  (Patient not taking: Reported on 06/04/2021)     No current facility-administered medications  for this visit.    Allergies:   Alendronate, Risedronate sodium, Sulfa antibiotics, Sulfasalazine, Other, and Tetracyclines & related    Social History:  The patient  reports that she has never smoked. She has never used smokeless tobacco. She reports that she does not drink alcohol and does not use drugs.   Family History:  The patient's family history includes Emphysema in her father; Heart attack (age of onset: 50) in her father; Heart disease in her father; Heart disease (age of onset: 11) in her brother; Hypertension in her mother; Rheum arthritis in her sister; Thyroid disease in her mother.    ROS:  Please see the history of present illness.   Otherwise, review of systems are positive for none.   All other systems are reviewed and negative.    PHYSICAL EXAM: VS:  BP 120/78 (BP Location: Left Arm, Patient Position: Sitting, Cuff Size: Normal)   Pulse 67   Ht 5\' 4"  (1.626 m)   Wt 115 lb (52.2 kg)   LMP 09/14/2014   BMI 19.74 kg/m  , BMI Body mass index is 19.74 kg/m. GENERAL:  Well appearing HEENT:  Pupils equal round and reactive, fundi not visualized, oral mucosa unremarkable NECK:  No jugular venous distention, waveform within normal limits, carotid upstroke brisk and symmetric, no bruits, no thyromegaly LYMPHATICS:  No cervical, inguinal adenopathy LUNGS:  Clear to auscultation bilaterally BACK:  No CVA tenderness CHEST:  Unremarkable HEART:  PMI not displaced or sustained,S1 and S2 within normal limits, no S3, no S4, no clicks, no rubs, no murmurs ABD:  Flat, positive bowel sounds normal in frequency in pitch, no bruits, no rebound, no guarding, no midline pulsatile mass, no hepatomegaly, no splenomegaly EXT:  2 plus pulses throughout, no edema, no cyanosis no clubbing SKIN:  No rashes no nodules NEURO:  Cranial nerves II through XII grossly intact, motor grossly intact throughout PSYCH:  Cognitively intact, oriented to person place and time    EKG:  EKG is not  ordered today. The ekg ordered March 27, 2021 demonstrates sinus rhythm, rate 68, axis within normal limits, intervals within normal limits, no acute ST-T wave changes.   Recent Labs: 05/13/2021: ALT 15; BUN 16; Creat 0.89; Hemoglobin 14.0; Magnesium 2.1; Platelets 229; Potassium 4.6; Sodium 138; TSH 1.51    Lipid Panel    Component Value Date/Time   CHOL 187 05/13/2021 1118   TRIG 90 05/13/2021 1118   HDL 67 05/13/2021 1118   CHOLHDL 2.8  05/13/2021 1118   VLDL 40 (H) 03/01/2017 1549   LDLCALC 102 (H) 05/13/2021 1118      Wt Readings from Last 3 Encounters:  06/04/21 115 lb (52.2 kg)  05/13/21 116 lb 12.8 oz (53 kg)  04/10/21 115 lb (52.2 kg)      Other studies Reviewed: Additional studies/ records that were reviewed today include: ED records 2016 and 2015 records. Review of the above records demonstrates:  Please see elsewhere in the note.     ASSESSMENT AND PLAN:  CHEST PAIN: She had 1 episode of chest pain back in July and she has not had any further discomfort.  She is very active.  There was no mention of any coronary calcium on her CT.  She has stress testing which was negative.  She has a minimal cardiovascular risk factors.  At this point I think the pretest probability of obstructive coronary disease is very low.  I would not suggest further testing.  DYSLIPIDEMIA: She has a minimal dyslipidemia and probably will do well with diet therapy.  Her LDL was only 102 with an HDL of 67.  She has been quartering her Crestor.  I suggested that she could do a half every other day just for ease and could consider diet management alone.   Current medicines are reviewed at length with the patient today.  The patient does not have concerns regarding medicines.  The following changes have been made:  no change  Labs/ tests ordered today include: None No orders of the defined types were placed in this encounter.    Disposition:   FU with me as needed.      Signed, Minus Breeding, MD  06/04/2021 8:33 AM    [facility]

## 2021-06-04 ENCOUNTER — Encounter: Payer: Self-pay | Admitting: Cardiology

## 2021-06-04 ENCOUNTER — Ambulatory Visit: Payer: PPO | Admitting: Cardiology

## 2021-06-04 ENCOUNTER — Other Ambulatory Visit: Payer: Self-pay

## 2021-06-04 VITALS — BP 120/78 | HR 67 | Ht 64.0 in | Wt 115.0 lb

## 2021-06-04 DIAGNOSIS — R072 Precordial pain: Secondary | ICD-10-CM

## 2021-06-04 NOTE — Patient Instructions (Signed)
Medication Instructions:  Your physician recommends that you continue on your current medications as directed. Please refer to the Current Medication list given to you today.  *If you need a refill on your cardiac medications before your next appointment, please call your pharmacy*  Follow-Up: At [facility], you and your health needs are our priority.  As part of our continuing mission to provide you with exceptional heart care, we have created designated Provider Care Teams.  These Care Teams include your primary Cardiologist (physician) and Advanced Practice Providers (APPs -  Physician Assistants and Nurse Practitioners) who all work together to provide you with the care you need, when you need it.  We recommend signing up for the patient portal called "MyChart".  Sign up information is provided on this After Visit Summary.  MyChart is used to connect with patients for Virtual Visits (Telemedicine).  Patients are able to view lab/test results, encounter notes, upcoming appointments, etc.  Non-urgent messages can be sent to your provider as well.   To learn more about what you can do with MyChart, go to [email or web address].    Your next appointment:   AS NEEDED with Dr. Hochrein   

## 2021-06-10 DIAGNOSIS — J471 Bronchiectasis with (acute) exacerbation: Secondary | ICD-10-CM | POA: Diagnosis not present

## 2021-06-10 DIAGNOSIS — G4733 Obstructive sleep apnea (adult) (pediatric): Secondary | ICD-10-CM | POA: Diagnosis not present

## 2021-06-10 DIAGNOSIS — J449 Chronic obstructive pulmonary disease, unspecified: Secondary | ICD-10-CM | POA: Diagnosis not present

## 2021-06-15 DIAGNOSIS — H5213 Myopia, bilateral: Secondary | ICD-10-CM | POA: Diagnosis not present

## 2021-06-15 DIAGNOSIS — H0100A Unspecified blepharitis right eye, upper and lower eyelids: Secondary | ICD-10-CM | POA: Diagnosis not present

## 2021-06-15 DIAGNOSIS — H26493 Other secondary cataract, bilateral: Secondary | ICD-10-CM | POA: Diagnosis not present

## 2021-06-15 DIAGNOSIS — H4421 Degenerative myopia, right eye: Secondary | ICD-10-CM | POA: Diagnosis not present

## 2021-06-29 ENCOUNTER — Encounter: Payer: Self-pay | Admitting: Neurology

## 2021-07-01 ENCOUNTER — Encounter: Payer: Self-pay | Admitting: Neurology

## 2021-07-01 ENCOUNTER — Ambulatory Visit: Payer: PPO | Admitting: Neurology

## 2021-07-01 ENCOUNTER — Other Ambulatory Visit: Payer: Self-pay

## 2021-07-01 VITALS — BP 133/76 | HR 65 | Ht 64.0 in | Wt 114.0 lb

## 2021-07-01 DIAGNOSIS — G4733 Obstructive sleep apnea (adult) (pediatric): Secondary | ICD-10-CM

## 2021-07-01 DIAGNOSIS — M810 Age-related osteoporosis without current pathological fracture: Secondary | ICD-10-CM | POA: Diagnosis not present

## 2021-07-01 DIAGNOSIS — Z79899 Other long term (current) drug therapy: Secondary | ICD-10-CM | POA: Diagnosis not present

## 2021-07-01 DIAGNOSIS — Z9989 Dependence on other enabling machines and devices: Secondary | ICD-10-CM | POA: Diagnosis not present

## 2021-07-01 DIAGNOSIS — J984 Other disorders of lung: Secondary | ICD-10-CM | POA: Diagnosis not present

## 2021-07-01 DIAGNOSIS — F5104 Psychophysiologic insomnia: Secondary | ICD-10-CM | POA: Diagnosis not present

## 2021-07-01 MED ORDER — TRAZODONE HCL 50 MG PO TABS: 50.0000 mg | ORAL_TABLET | Freq: Every day | ORAL | 2 refills | Status: DC

## 2021-07-01 NOTE — Patient Instructions (Signed)
Trazodone Tablets What is this medication? TRAZODONE (TRAZ oh done) treats depression. It increases the amount of serotonin in the brain, a hormone that helps regulate mood. This medicine may be used for other purposes; ask your health care provider or pharmacist if you have questions. COMMON BRAND NAME(S): Desyrel What should I tell my care team before I take this medication? They need to know if you have any of these conditions: Attempted suicide or thinking about it Bipolar disorder Bleeding problems Glaucoma Heart disease, or previous heart attack Irregular heart beat Kidney or liver disease Low levels of sodium in the blood An unusual or allergic reaction to trazodone, other medications, foods, dyes or preservatives Pregnant or trying to get pregnant Breast-feeding How should I use this medication? Take this medication by mouth with a glass of water. Follow the directions on the prescription label. Take this medication shortly after a meal or a light snack. Take your medication at regular intervals. Do not take your medication more often than directed. Do not stop taking this medication suddenly except upon the advice of your care team. Stopping this medication too quickly may cause serious side effects or your condition may worsen. A special MedGuide will be given to you by the pharmacist with each prescription and refill. Be sure to read this information carefully each time. Talk to your care team regarding the use of this medication in children. Special care may be needed. Overdosage: If you think you have taken too much of this medicine contact a poison control center or emergency room at once. NOTE: This medicine is only for you. Do not share this medicine with others. What if I miss a dose? If you miss a dose, take it as soon as you can. If it is almost time for your next dose, take only that dose. Do not take double or extra doses. What may interact with this medication? Do not  take this medication with any of the following: Certain medications for fungal infections like fluconazole, itraconazole, ketoconazole, posaconazole, voriconazole Cisapride Dronedarone Linezolid MAOIs like Carbex, Eldepryl, Marplan, Nardil, and Parnate Mesoridazine Methylene blue (injected into a vein) Pimozide Saquinavir Thioridazine This medication may also interact with the following: Alcohol Antiviral medications for HIV or AIDS Aspirin and aspirin-like medications Barbiturates like phenobarbital Certain medications for blood pressure, heart disease, irregular heart beat Certain medications for depression, anxiety, or psychotic disturbances Certain medications for migraine headache like almotriptan, eletriptan, frovatriptan, naratriptan, rizatriptan, sumatriptan, zolmitriptan Certain medications for seizures like carbamazepine and phenytoin Certain medications for sleep Certain medications that treat or prevent blood clots like dalteparin, enoxaparin, warfarin Digoxin Fentanyl Lithium NSAIDS, medications for pain and inflammation, like ibuprofen or naproxen Other medications that prolong the QT interval (cause an abnormal heart rhythm) like dofetilide Rasagiline Supplements like St. John's wort, kava kava, valerian Tramadol Tryptophan This list may not describe all possible interactions. Give your health care provider a list of all the medicines, herbs, non-prescription drugs, or dietary supplements you use. Also tell them if you smoke, drink alcohol, or use illegal drugs. Some items may interact with your medicine. What should I watch for while using this medication? Tell your care team if your symptoms do not get better or if they get worse. Visit your care team for regular checks on your progress. Because it may take several weeks to see the full effects of this medication, it is important to continue your treatment as prescribed by your care team. Watch for new or worsening  thoughts of  suicide or depression. This includes sudden changes in mood, behaviors, or thoughts. These changes can happen at any time but are more common in the beginning of treatment or after a change in dose. Call your care team right away if you experience these thoughts or worsening depression. Manic episodes may happen in patients with bipolar disorder who take this medication. Watch for changes in feelings or behaviors such as feeling anxious, nervous, agitated, panicky, irritable, hostile, aggressive, impulsive, severely restless, overly excited and hyperactive, or trouble sleeping. These changes can happen at any time but are more common in the beginning of treatment or after a change in dose. Call your care team right away if you notice any of these symptoms. You may get drowsy or dizzy. Do not drive, use machinery, or do anything that needs mental alertness until you know how this medication affects you. Do not stand or sit up quickly, especially if you are an older patient. This reduces the risk of dizzy or fainting spells. Alcohol may interfere with the effect of this medication. Avoid alcoholic drinks. This medication may cause dry eyes and blurred vision. If you wear contact lenses you may feel some discomfort. Lubricating drops may help. See your eye doctor if the problem does not go away or is severe. Your mouth may get dry. Chewing sugarless gum, sucking hard candy and drinking plenty of water may help. Contact your care team if the problem does not go away or is severe. What side effects may I notice from receiving this medication? Side effects that you should report to your care team as soon as possible: Allergic reactions-skin rash, itching, hives, swelling of the face, lips, tongue, or throat Bleeding-bloody or black, tar-like stools, red or dark brown urine, vomiting blood or brown material that looks like coffee grounds, small, red or purple spots on skin, unusual bleeding or  bruising Heart rhythm changes-fast or irregular heartbeat, dizziness, feeling faint or lightheaded, chest pain, trouble breathing Low blood pressure-dizziness, feeling faint or lightheaded, blurry vision Low sodium level-muscle weakness, fatigue, dizziness, headache, confusion Prolonged or painful erection Serotonin syndrome-irritability, confusion, fast or irregular heartbeat, muscle stiffness, twitching muscles, sweating, high fever, seizures, chills, vomiting, diarrhea Sudden eye pain or change in vision such as blurry vision, seeing halos around lights, vision loss Thoughts of suicide or self-harm, worsening mood, feelings of depression Side effects that usually do not require medical attention (report to your care team if they continue or are bothersome): Change in sex drive or performance Constipation Dizziness Drowsiness Dry mouth This list may not describe all possible side effects. Call your doctor for medical advice about side effects. You may report side effects to FDA at 1-800-FDA-1088. Where should I keep my medication? Keep out of the reach of children and pets. Store at room temperature between 15 and 30 degrees C (59 to 86 degrees F). Protect from light. Keep container tightly closed. Throw away any unused medication after the expiration date. NOTE: This sheet is a summary. It may not cover all possible information. If you have questions about this medicine, talk to your doctor, pharmacist, or health care provider.  2022 [employer] (2020-07-21 14:46:11)

## 2021-07-01 NOTE — Progress Notes (Signed)
SLEEP MEDICINE CLINIC   Provider:  Larey Seat, M D  Primary Care Physician:  Unk Pinto, MD   Referring Provider: Unk Pinto, MD    Chief Complaint  Patient presents with   Follow-up    Pt alone, rm 10. Pt states overall she just has problems with sleeping in general. She has hard time keeping the machine on. States that she has tried tylenol pm. DME: [employer].     HPI:  Shannon Parsons is a 80 y.o. female patient , and here for a RV on her apnea; she underwent a sleep study-: she had been a cpap user for many years before we re-evaluated her.  She underwent a sleep study on 6-7 2021 in form of a home sleep test.  I was mildly surprised that she had severe sleep apnea with an AHI of 50/h and REM sleep AHI of 36/h.  Also she is providing very good effort and using her CPAP she still has never felt that she gets more than 4 hours of sleep and interrupted sleep.  She feels that she still struggles with the machine she is not excessively daytime sleepy, today's Epworth score score was endorsed at 4 points she is not depressed, and she locked 24 out of 30 days on her compliance data.  On average she just manages 3 hours and 33 minutes.  So she is actually using it less than 4 hours at night.  The settings have remained 6 to 14 cm with 3 cm EPR, the residual AHI is very low 0.3/h and she does have significant air leakage from her mask.  95th percentile air leak is 28 L/min.  The pressure she uses is 11 cmH2O on the 95th percentile.  She did not have a REM dependent form of sleep apnea and she did not have a significant hypoxemia. She has a history of asthma and some "scarring on the lung" but not COPD> yet there is clear recorded lung disease ? If related  to chemotherapy.  Her cardiologist told her she is fine.   Insomnia precedes the OSA diagnosis, and we should consider using a sleep aid.  Her oncologist gave her a sleep aid prescription and she is reluctant to use  TEMAZEPAM pills to sleep.  I assured her we have non addictive options such as RAZODONE, Belsomra. She is a light sleeper.      Supine sleep was the most prone apnea position.  There was no prolonged hypoxemia and I asked her to resume CPAP she uses a auto titration CPAP and the minimum pressure set at 6 the maximum pressure of 18 cmH2O with 3 cm expiratory pressure relief.  She has been highly compliant 97% of the days 87% compliance by hours with an average usage time of 5 hours 23 minutes each night.  Her residual AHI is 0.6/h which is excellent she has no central apneas among the residual.  Her 95th percentile pressure is 10.3 cm water - so we can really reduce the maximum pressure of her CPAP. She does have moderate to severe air leakage and wonders about a different mask. She has nasal pillows. The headgear is loosing it's elasticity quickly- She already switched and  uses a Bella swift with ear loops in XS size. [name]  She has dry eyes , but no aerophagia.      Whom I have encountered last in November 2019 so about 18 months ago.  At the time she felt that her sleep  had improved to a level where another evaluation would not be needed.  My diagnosis at the time was a breathing disorder related to chemotherapy induced lung changes and she has followed Dr. Lavone Neri as a pulmonologist.  She is meanwhile 80 years old, has a history of hypertension, GERD, prediabetes, diverticulosis, generalized anxiety, cholesterol and vitamin D and is being treated almost being treated for a mantle cell carcinoma is Dr. Burney Gauze.  The treatment has been  complicated when she developed a deep venous thrombosis.   She had received the Covid vaccine in February, she has been now fully vaccinated.  For the last 3 to 6 months she has not been feeling well it may by now be 6 to 8 months.  Her main problem is trouble sleeping and her CPAP has not been used in over 2 years.  There was a good reason for her to avoid CPAP as she  felt that it increased her frequency of bronchitic infections.  She has often sporadic nausea and a feeling of being sick to her stomach vomiting sometimes and she has felt that lorazepam helped to overcome nausea as well as anxiety and introducing sleep.  These nausea spells happen not infrequently about 4 hours after she had a meal and she does not think it specific to any foods.  She has no trouble swallowing food.    Since she has a history of now untreated sleep apnea ( she is not on a CPAP)  her primary care office , notable PA Vicie Mutters, wanted her sleep reevaluated.   The patient has been living with a lot more stress, neurology and anxiety since her husband has dementia symptoms. Her main concern is insomnia, not physically related to any breathing difficulties but to IBS, frequent stools at night , and causing anxiety. She feels overwhelmed by taking on his appointments, his financial papers, the tax returns.   She goes to bed at 10 and often is still awake at midnight, every 2 hours waking up, and sometimes being unable to return to sleep for an hour in between, she is exhausted.   She was seen here on 07-20-2018 in a referral  from Dr. Melford Aase for transfer of sleep apnea care.  Chief complaint according to patient : " I don't like using it ( CPAP ), and I do have frequent Bronchitis. I have lymphoma. I never saw a sleep doctor and was given an AUTO CPAP without titration. My baseline AHI 5 years ago was 16.0 / h". "I may not need it anymore ? " I had the pleasure of seeing [name] at [facility] on the 20 July 2018, as a new patient to my sleep clinic.   She states that over 5 years ago she had undergone an overnight polysomnographic study at [facility] long hospital location, interpreted by Dr. Baird Lyons.  She recalls that she had only mild apnea at an AHI of 16/h and was prescribed an auto CPAP soon after.  She had used the machine compliantly for several years but then developed  more and more often upper airway infections bronchitis and sinusitis and felt that this may be triggered by CPAP use.  Just by end of September she restarted using CPAP to give [place] some data to.  From 20 September through October she had used the machine 31 out of 35 days with an average daily use of time of 5 hours and 19 minutes, set pressure of 10 cm is seen on her  download with 3 cm EPR and very high air leaks are noted.  The residual AHI is only 0.2/h a good resolution of apnea as noted but the air leaks are quite substantial.  She is using nasal pillows that were fitted especially for her to be worn with loops around the ears rather than a strap on the back of her head.   Sleep/ Medical history: 1)The patient had developed a plethora of new medical problems over the last 5 to 7 years.  She was diagnosed with atherosclerosis of the ascending aorta,  2)chronic obstructive airway disease with asthma -she is using inhalers as needed and continues to follow-up with pulmonary care.   3) hypotension blood pressure at home is as low as 90/60 mmHg.  She did continue with the DASH diet restricting her sodium intake, she does keep hydrated.   4)She has hypercholesterolemia on rosuvastatin-Crestor.  5) Prediabetes.  6) Hypothyroidism. 7) Vitamin D deficiency.   8)The patient was diagnosed in January 2016 with a mantle cell Lymphoma and by September 2016 was involved and chemotherapy with Dr. Marin Olp and maintained through November 2018 she did achieve remission.  Dr. Melvyn Novas  has followed her for frequent pulmonary infections with diagnosis of obstructive bronchiectasis and asthma.  Her BMI is 21.45. 9) Tremor evaluation , had it for years, seen by R.Tat, DO- she confirmed is was not PD, but essential tremor.  10) asthma all my life. I reviewed her medication and her recent lab results which included TSH of 2.73, a vit D  level of 55, HbA1c at 5.8, total cholesterol 241.   Sleep habits are as follows: Dinner  time at 6 PM, bedtime is 10 PM. The bedroom is cool, quiet and dark- no TV in the bedroom.  She has preferred reading in bed, she sleeps supine since using CPAP, on two pillows. Most nights asleep within 30 minutes. She has one nocturia , and goes back to sleep- while not using CPAP recently. No gasping, no palpitation, no chest pains.  She rises at 7 AM , feeling now refreshed and better than when she used CPAP. ( Recovering from bronchitis, rhinitis, and asthma. She was given Rituximab - immune suppressed).  Rarely napping in daytime. If  Taking a nap it will last 1 hour.    Family sleep history: no family members with sleep apnea.   Social history: married, childless, husband has health problems, too. No pets in the home.  She drinks coffee 2 cups a day, iced tea rarely- decaff only, and sodas - none.  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    Review of Systems: Out of a complete 14 system review, the patient complains of only the following symptoms, and all other reviewed systems are negative.   The patient reports that she sometimes has insomnia but it is usually when she is under stress or something worries her a lot, she knows that she is snoring but she has not complained of snoring herself awake or feeling that snoring impairs her quality of sleep.   She has high cholesterol but had no vascular or arterial disease is related to it.   She had lymphoma was diagnosed 5 years ago and is in remission.   INSOMNIA for decades.   Epworth score 4/ 24 - remains low  ,  Fatigue severity score 18/ 63   ,  depression score 5/15 , high anxiety, tremor.    Social History   Socioeconomic History  Marital status: Married    Spouse name: Not on file   Number of children: 0   Years of education: 14   Highest education level: Not on file  Occupational History   Occupation: retired from office work  Tobacco Use   Smoking status: Never   Smokeless tobacco: Never   Tobacco  comments:    Never Used Tobacco  Vaping Use   Vaping Use: Never used  Substance and Sexual Activity   Alcohol use: No    Alcohol/week: 0.0 standard drinks   Drug use: No   Sexual activity: Not on file  Other Topics Concern   Not on file  Social History Narrative   Patient lives with her husband in a two story home.  Has no children.  Retired from office work.  Education: some college.    Social Determinants of Health   Financial Resource Strain: Not on file  Food Insecurity: Not on file  Transportation Needs: Not on file  Physical Activity: Not on file  Stress: Not on file  Social Connections: Not on file  Intimate Partner Violence: Not on file    Family History  Problem Relation Age of Onset   Emphysema Father    Heart disease Father    Heart attack Father 87       Died   Heart disease Brother 9   Rheum arthritis Sister    Hypertension Mother    Thyroid disease Mother    Colon cancer Neg Hx    Stomach cancer Neg Hx     Past Medical History:  Diagnosis Date   Adenomatous colon polyp 1994   Asthma    Cataract 2013   bilateral eyes   Diverticulosis    Family history of ischemic heart disease    Fibrocystic breast disease    GERD (gastroesophageal reflux disease)    Hiatal hernia    Hyperlipidemia    Hypertension    pt denies - 01/16/18   IBS (irritable bowel syndrome)    Internal hemorrhoids    Mantle cell lymphoma ([place]) 10/18/2014   Basal skin cancer     Facial, pt denies on 01/16/18   OSA (obstructive sleep apnea)    Osteoarthritis    Osteoporosis    Prediabetes    Unspecified hypothyroidism    Vitamin D deficiency     Past Surgical History:  Procedure Laterality Date   APPENDECTOMY     BUNIONECTOMY     CATARACT EXTRACTION, BILATERAL     IR REMOVAL TUN ACCESS W/ PORT W/O FL MOD SED  03/04/2020   KNEE ARTHROSCOPY     TONSILLECTOMY     UMBILICAL HERNIA REPAIR     VIDEO BRONCHOSCOPY Bilateral 10/12/2017   Procedure: VIDEO BRONCHOSCOPY WITHOUT FLUORO;   Surgeon: Tanda Rockers, MD;  Location: [facility];  Service: Endoscopy;  Laterality: Bilateral;    Current Outpatient Medications  Medication Sig Dispense Refill   acidophilus (RISAQUAD) CAPS capsule Take 1 capsule by mouth daily.     albuterol (VENTOLIN HFA) 108 (90 Base) MCG/ACT inhaler Use 2 Inhalations 15 minutes apart every 4 hours as needed to Rescue Asthma 48 g 3   Cholecalciferol (VITAMIN D3) 50 MCG (2000 UT) capsule Take 4,000 Units by mouth daily.      hyoscyamine (LEVSIN SL) 0.125 MG SL tablet Take    1 to 2 tablets     every 4 hours       as needed for Nausea, Cramping, Bloating or Diarrhea DIARRHEA 90 tablet  0   levothyroxine (SYNTHROID) 50 MCG tablet Take  1 tablet  Daily  on an empty stomach with only water for 30 minutes & no Antacid meds, Calcium or Magnesium for 4 hours & avoid Biotin 90 tablet 3   LORazepam (ATIVAN) 0.5 MG tablet lorazepam 0.5 mg tablet  TAKE 1 TABLET BY MOUTH EVERY 8 HOURS AS NEEDED FOR SLEEP (NAUSEA OR VOMITING).     promethazine-dextromethorphan (PROMETHAZINE-DM) 6.25-15 MG/5ML syrup Take 5 mLs by mouth 4 (four) times daily as needed for cough. 240 mL 1   rosuvastatin (CRESTOR) 5 MG tablet Take  1/4 tablet  Daily  for Cholesterol 23 tablet 1   temazepam (RESTORIL) 15 MG capsule Take 1 capsule (15 mg total) by mouth at bedtime as needed for sleep. 30 capsule 0   No current facility-administered medications for this visit.    Allergies as of 07/01/2021 - Review Complete 06/04/2021  Allergen Reaction Noted   Alendronate Other (See Comments) 09/15/2013   Risedronate sodium Other (See Comments) 09/15/2013   Sulfa antibiotics Other (See Comments) 09/15/2013   Sulfasalazine Other (See Comments) 09/15/2013   Other Rash 03/16/2011   Tetracyclines & related Rash 03/16/2011    Vitals: BP 133/76   Pulse 65   Ht 5\' 4"  (1.626 m)   Wt 114 lb (51.7 kg)   LMP 09/14/2014   BMI 19.57 kg/m  Last Weight:  Wt Readings from Last 1 Encounters:  07/01/21 114  lb (51.7 kg)   YKD:XIPJ mass index is 19.57 kg/m.     Last Height:   Ht Readings from Last 1 Encounters:  07/01/21 5\' 4"  (1.626 m)    Physical exam:  General: The patient is awake, alert and appears not in acute distress. The patient is well groomed. She appears nervous.  Head: Normocephalic, atraumatic. Titubation noted. Neck is supple. Mallampati 3, uvula deviated to the right.  neck circumference:14. 75. Nasal airflow patent - just recovering form congestion. ,  Retrognathia is seen.  Cardiovascular:  Regular rate and rhythm, without  murmurs or carotid bruit, and without distended neck veins. Respiratory: Lungs are clear to auscultation. Skin:  Without evidence of edema, or rash Trunk: BMI is 19.88 kg/m2. The patient's posture is erect - very erect.   Neurologic exam :The patient is awake and alert, oriented to place and time.   Memory subjective  described as intact.  Attention span & concentration ability appears normal.  Speech is fluent,  without  dysarthria, mild dysphonia , no aphasia.  Mood and affect are defensive, slightly aggressive.   Cranial nerves: Pupils are equal and briskly reactive to light. Funduscopic exam deferred. appears to have dry eyes, lower eye lids reddened . [name] Extraocular movements  in vertical and horizontal planes intact and without nystagmus.Hearing to finger rub intact. Facial sensation intact to fine touch. Facial motor strength is symmetric with a left sided mild ptosis.   Her tongue and uvula are without tremor- tongue is  midline. Uvula is slightly deviated to the right.  Shoulder shrug affected by cervical dystonia.  Motor exam:  Normal tone, muscle bulk and symmetric strength in all extremities. Deep tendon reflexes: in the  upper and lower extremities are symmetric and intact. Babinski maneuver deferred. .  Assessment:    Mrs. Brillhart is a pleasant 80year-old Caucasian right-handed female patient with a history of non- parkinsonian tremor, a  diagnosis of cervical dystonia, lymphoma in remission, lifelong asthmatic, but she developed bronchiectasis and restrictive airway disease to a much greater degree  likely associated with chemotherapy the treatment of lymphoma.  She has been diagnosed with a mild sleep apnea before the lymphoma and for years CPAP did her well but now she feels that CPAP actually contributes to recurrent airway infections.   We replaced her CPAP and her main concern h now is  insomnia, which also precedes the onset of CPAP care.   She was restested for apnea and the AHI was high, severe, non REM dependent , she uses an auto titration CPAP and the 95% pressure is at 10.3 cm water and a high air leak while using a nasal bella swift with ear loops.   No  So-clean machine - ozone machines have shown to break down isolation foam in CPAP machines.    Plan:  Treatment plan and additional workup : HST per patient's request.  Insomnia is likely unrelated to neurological or organic problems, I will not provide sonata 5 mg. She failed xanax and diazepam, and is currently no longer remembering if trazodone with PCP helped or not. She was concerned about taking sleep aids and never gave the drug a chance.  .  I also will ask DME to help with avoiding air leaks further - she will ned a personal visit.   RV in 12 month with NP.      Larey Seat, MD 91/63/8466, 59:93 AM  Certified in Neurology by ABPN Certified in [place] by [facility] [address] [place], [place] [address]

## 2021-07-02 DIAGNOSIS — D1801 Hemangioma of skin and subcutaneous tissue: Secondary | ICD-10-CM | POA: Diagnosis not present

## 2021-07-02 DIAGNOSIS — L853 Xerosis cutis: Secondary | ICD-10-CM | POA: Diagnosis not present

## 2021-07-02 DIAGNOSIS — Z85828 Personal history of other malignant neoplasm of skin: Secondary | ICD-10-CM | POA: Diagnosis not present

## 2021-07-02 DIAGNOSIS — L720 Epidermal cyst: Secondary | ICD-10-CM | POA: Diagnosis not present

## 2021-07-02 DIAGNOSIS — D225 Melanocytic nevi of trunk: Secondary | ICD-10-CM | POA: Diagnosis not present

## 2021-07-02 DIAGNOSIS — L821 Other seborrheic keratosis: Secondary | ICD-10-CM | POA: Diagnosis not present

## 2021-07-02 DIAGNOSIS — L814 Other melanin hyperpigmentation: Secondary | ICD-10-CM | POA: Diagnosis not present

## 2021-07-21 ENCOUNTER — Telehealth: Payer: Self-pay | Admitting: Internal Medicine

## 2021-07-21 NOTE — Chronic Care Management (AMB) (Signed)
  Chronic Care Management   Note  07/21/2021 Name: Shannon Parsons MRN: [id number] DOB: [birth year]  Shannon Parsons is a 80 y.o. year old female who is a primary care patient of Unk Pinto, MD. I reached out to [name] by phone today in response to a referral sent by Shannon Parsons's PCP, Unk Pinto, MD.   Shannon Parsons was given information about Chronic Care Management services today including:  CCM service includes personalized support from designated clinical staff supervised by her physician, including individualized plan of care and coordination with other care providers 24/7 contact phone numbers for assistance for urgent and routine care needs. Service will only be billed when office clinical staff spend 20 minutes or more in a month to coordinate care. Only one practitioner may furnish and bill the service in a calendar month. The patient may stop CCM services at any time (effective at the end of the month) by phone call to the office staff.   Patient agreed to services and verbal consent obtained.   Follow up plan:   Tatjana Secretary/administrator

## 2021-07-23 ENCOUNTER — Other Ambulatory Visit: Payer: Self-pay | Admitting: Internal Medicine

## 2021-07-23 DIAGNOSIS — E782 Mixed hyperlipidemia: Secondary | ICD-10-CM

## 2021-07-23 NOTE — Progress Notes (Deleted)
Assessment and Plan:  There are no diagnoses linked to this encounter.    Further disposition pending results of labs. Discussed med's effects and SE's.   Over 30 minutes of exam, counseling, chart review, and critical decision making was performed.   Future Appointments  Date Time Provider [place]  07/28/2021 11:00 AM Magda Bernheim, NP [facility] None  07/30/2021 11:30 AM [facility] None  07/30/2021 12:00 PM Ennever, Rudell Cobb, MD [facility] None  08/17/2021  3:00 PM Newton Pigg, [facility] None  11/17/2021 11:00 AM Unk Pinto, MD [facility] None  05/13/2022 10:00 AM Magda Bernheim, NP [facility] None  07/07/2022 11:00 AM Debbora Presto, NP [facility] None    ------------------------------------------------------------------------------------------------------------------   HPI LMP 09/14/2014  80 y.o.female presents for  Past Medical History:  Diagnosis Date   Adenomatous colon polyp 1994   Asthma    Cataract 2013   bilateral eyes   Diverticulosis    Family history of ischemic heart disease    Fibrocystic breast disease    GERD (gastroesophageal reflux disease)    Hiatal hernia    Hyperlipidemia    IBS (irritable bowel syndrome)    Internal hemorrhoids    Mantle cell lymphoma ([place]) 10/18/2014   Melanoma ([place])    Facial, pt denies on 01/16/18   OSA (obstructive sleep apnea)    Osteoarthritis    Osteoporosis    Prediabetes    Unspecified hypothyroidism    Vitamin D deficiency      Allergies  Allergen Reactions   Alendronate Other (See Comments)    "made me choke"     Risedronate Sodium Other (See Comments)    "made me choke"     Sulfa Antibiotics Other (See Comments)    Doesn't remember    Sulfasalazine Other (See Comments)    Doesn't remember    Other Rash   Tetracyclines & Related Rash    Current Outpatient Medications on File Prior to Visit  Medication Sig   acidophilus (RISAQUAD) CAPS capsule Take 1 capsule by mouth daily.    albuterol (VENTOLIN HFA) 108 (90 Base) MCG/ACT inhaler Use 2 Inhalations 15 minutes apart every 4 hours as needed to Rescue Asthma   Cholecalciferol (VITAMIN D3) 50 MCG (2000 UT) capsule Take 4,000 Units by mouth daily.    hyoscyamine (LEVSIN SL) 0.125 MG SL tablet Take    1 to 2 tablets     every 4 hours       as needed for Nausea, Cramping, Bloating or Diarrhea DIARRHEA   levothyroxine (SYNTHROID) 50 MCG tablet Take  1 tablet  Daily  on an empty stomach with only water for 30 minutes & no Antacid meds, Calcium or Magnesium for 4 hours & avoid Biotin   LORazepam (ATIVAN) 0.5 MG tablet lorazepam 0.5 mg tablet  TAKE 1 TABLET BY MOUTH EVERY 8 HOURS AS NEEDED FOR SLEEP (NAUSEA OR VOMITING).   promethazine-dextromethorphan (PROMETHAZINE-DM) 6.25-15 MG/5ML syrup Take 5 mLs by mouth 4 (four) times daily as needed for cough.   rosuvastatin (CRESTOR) 5 MG tablet TAKE 1/4 TABLET DAILY FOR CHOLESTEROL   temazepam (RESTORIL) 15 MG capsule Take 1 capsule (15 mg total) by mouth at bedtime as needed for sleep.   traZODone (DESYREL) 50 MG tablet Take 1 tablet (50 mg total) by mouth at bedtime.   No current facility-administered medications on file prior to visit.    ROS: all negative except above.   Physical Exam:  LMP 09/14/2014   General Appearance: Well nourished, in  no apparent distress. Eyes: PERRLA, EOMs, conjunctiva no swelling or erythema Sinuses: No Frontal/maxillary tenderness ENT/Mouth: Ext aud canals clear, TMs without erythema, bulging. No erythema, swelling, or exudate on post pharynx.  Tonsils not swollen or erythematous. Hearing normal.  Neck: Supple, thyroid normal.  Respiratory: Respiratory effort normal, BS equal bilaterally without rales, rhonchi, wheezing or stridor.  Cardio: RRR with no MRGs. Brisk peripheral pulses without edema.  Abdomen: Soft, + BS.  Non tender, no guarding, rebound, hernias, masses. Lymphatics: Non tender without lymphadenopathy.  Musculoskeletal: Full ROM,  5/5 strength, normal gait.  Skin: Warm, dry without rashes, lesions, ecchymosis.  Neuro: Cranial nerves intact. Normal muscle tone, no cerebellar symptoms. Sensation intact.  Psych: Awake and oriented X 3, normal affect, Insight and Judgment appropriate.     Magda Bernheim, NP 4:23 PM [facility]

## 2021-07-28 ENCOUNTER — Ambulatory Visit: Payer: PPO | Admitting: Nurse Practitioner

## 2021-07-30 ENCOUNTER — Ambulatory Visit: Payer: PPO | Admitting: Hematology & Oncology

## 2021-07-30 ENCOUNTER — Inpatient Hospital Stay: Payer: PPO

## 2021-08-10 ENCOUNTER — Inpatient Hospital Stay (HOSPITAL_BASED_OUTPATIENT_CLINIC_OR_DEPARTMENT_OTHER): Payer: PPO | Admitting: Hematology & Oncology

## 2021-08-10 ENCOUNTER — Inpatient Hospital Stay: Payer: PPO | Attending: Hematology & Oncology

## 2021-08-10 ENCOUNTER — Other Ambulatory Visit: Payer: Self-pay

## 2021-08-10 ENCOUNTER — Encounter: Payer: Self-pay | Admitting: Hematology & Oncology

## 2021-08-10 VITALS — BP 159/70 | HR 68 | Temp 98.7°F | Resp 16 | Wt 116.0 lb

## 2021-08-10 DIAGNOSIS — C8313 Mantle cell lymphoma, intra-abdominal lymph nodes: Secondary | ICD-10-CM

## 2021-08-10 DIAGNOSIS — D801 Nonfamilial hypogammaglobulinemia: Secondary | ICD-10-CM | POA: Diagnosis not present

## 2021-08-10 DIAGNOSIS — Z8572 Personal history of non-Hodgkin lymphomas: Secondary | ICD-10-CM | POA: Diagnosis not present

## 2021-08-10 DIAGNOSIS — Z79899 Other long term (current) drug therapy: Secondary | ICD-10-CM | POA: Insufficient documentation

## 2021-08-10 DIAGNOSIS — Z9221 Personal history of antineoplastic chemotherapy: Secondary | ICD-10-CM | POA: Diagnosis not present

## 2021-08-10 LAB — CBC WITH DIFFERENTIAL (CANCER CENTER ONLY)
Abs Immature Granulocytes: 0.03 10*3/uL (ref 0.00–0.07)
Basophils Absolute: 0.1 10*3/uL (ref 0.0–0.1)
Basophils Relative: 1 %
Eosinophils Absolute: 0.1 10*3/uL (ref 0.0–0.5)
Eosinophils Relative: 2 %
HCT: 45 % (ref 36.0–46.0)
Hemoglobin: 14.5 g/dL (ref 12.0–15.0)
Immature Granulocytes: 0 %
Lymphocytes Relative: 24 %
Lymphs Abs: 1.6 10*3/uL (ref 0.7–4.0)
MCH: 29.1 pg (ref 26.0–34.0)
MCHC: 32.2 g/dL (ref 30.0–36.0)
MCV: 90.4 fL (ref 80.0–100.0)
Monocytes Absolute: 0.8 10*3/uL (ref 0.1–1.0)
Monocytes Relative: 12 %
Neutro Abs: 4.1 10*3/uL (ref 1.7–7.7)
Neutrophils Relative %: 61 %
Platelet Count: 189 10*3/uL (ref 150–400)
RBC: 4.98 MIL/uL (ref 3.87–5.11)
RDW: 14.2 % (ref 11.5–15.5)
WBC Count: 6.8 10*3/uL (ref 4.0–10.5)
nRBC: 0 % (ref 0.0–0.2)

## 2021-08-10 LAB — CMP (CANCER CENTER ONLY)
ALT: 17 U/L (ref 0–44)
AST: 28 U/L (ref 15–41)
Albumin: 4.5 g/dL (ref 3.5–5.0)
Alkaline Phosphatase: 59 U/L (ref 38–126)
Anion gap: 10 (ref 5–15)
BUN: 15 mg/dL (ref 8–23)
CO2: 28 mmol/L (ref 22–32)
Calcium: 10.3 mg/dL (ref 8.9–10.3)
Chloride: 103 mmol/L (ref 98–111)
Creatinine: 0.95 mg/dL (ref 0.44–1.00)
GFR, Estimated: >60 mL/min (ref >60)
Glucose, Bld: 109 mg/dL — ABNORMAL HIGH (ref 70–99)
Potassium: 4.7 mmol/L (ref 3.5–5.1)
Sodium: 141 mmol/L (ref 135–145)
Total Bilirubin: 0.7 mg/dL (ref 0.3–1.2)
Total Protein: 6.4 g/dL — ABNORMAL LOW (ref 6.5–8.1)

## 2021-08-10 LAB — LACTATE DEHYDROGENASE: LDH: 183 U/L (ref 98–192)

## 2021-08-10 NOTE — Progress Notes (Signed)
Hematology and Oncology Follow Up Visit  Shannon Parsons [id number] Jul 22, 1941 80 y.o. 08/10/2021   Principle Diagnosis:  Mantle cell lymphoma (MCIPI = 5) Thromboembolic event of the right leg Acquired Hypogammaglobulinemia Bronchiectasis  Current Therapy:   Rituxan - maintanence - s/p cycle #10 - to finish in 07/2017 IVIG - q  month -restart on 10/27/2017; last dose 01/05/2018     Interim History:    Shannon Parsons now comes in for follow-up.  We see her every 6 months.  She is doing pretty well.  She had no problems over the summertime.  She has had no issues with infections.  She has had no cough or shortness of breath.  I think she does have some element of bronchiectasis.  However, this is not had any flareups.  She has had no change in bowel or bladder habits.  She has had no rashes.  There is been no leg swelling.  She had a decent Thanksgiving.  Her appetite has been good.  She has had no problems with headache..  She has had some abdominal issues.  She is worried about having celiac disease.  She does see [facility].    Overall, I would have to say that her performance status is ECOG 1.    Medications:  Current Outpatient Medications:    acidophilus (RISAQUAD) CAPS capsule, Take 1 capsule by mouth daily., Disp: , Rfl:    albuterol (VENTOLIN HFA) 108 (90 Base) MCG/ACT inhaler, Use 2 Inhalations 15 minutes apart every 4 hours as needed to Rescue Asthma, Disp: 48 g, Rfl: 3   Cholecalciferol (VITAMIN D3) 50 MCG (2000 UT) capsule, Take 4,000 Units by mouth daily. , Disp: , Rfl:    hyoscyamine (LEVSIN SL) 0.125 MG SL tablet, Take    1 to 2 tablets     every 4 hours       as needed for Nausea, Cramping, Bloating or Diarrhea DIARRHEA, Disp: 90 tablet, Rfl: 0   levothyroxine (SYNTHROID) 50 MCG tablet, Take  1 tablet  Daily  on an empty stomach with only water for 30 minutes & no Antacid meds, Calcium or Magnesium for 4 hours & avoid Biotin, Disp: 90 tablet, Rfl:  3   LORazepam (ATIVAN) 0.5 MG tablet, lorazepam 0.5 mg tablet  TAKE 1 TABLET BY MOUTH EVERY 8 HOURS AS NEEDED FOR SLEEP (NAUSEA OR VOMITING)., Disp: , Rfl:    rosuvastatin (CRESTOR) 5 MG tablet, TAKE 1/4 TABLET DAILY FOR CHOLESTEROL, Disp: 23 tablet, Rfl: 1   temazepam (RESTORIL) 15 MG capsule, Take 1 capsule (15 mg total) by mouth at bedtime as needed for sleep., Disp: 30 capsule, Rfl: 0   traZODone (DESYREL) 50 MG tablet, Take 1 tablet (50 mg total) by mouth at bedtime., Disp: 30 tablet, Rfl: 2  Allergies:  Allergies  Allergen Reactions   Alendronate Other (See Comments)    "made me choke"     Risedronate Sodium Other (See Comments)    "made me choke"     Sulfa Antibiotics Other (See Comments)    Doesn't remember    Sulfasalazine Other (See Comments)    Doesn't remember    Other Rash   Tetracyclines & Related Rash    Past Medical History, Surgical history, Social history, and Family History were reviewed and updated.  Review of Systems: Review of Systems  Constitutional:  Positive for malaise/fatigue.  HENT:  Positive for congestion.   Eyes: Negative.   Respiratory:  Positive for shortness of breath and wheezing.  Cardiovascular: Negative.   Gastrointestinal: Negative.   Genitourinary: Negative.   Musculoskeletal: Negative.   Skin: Negative.   Neurological: Negative.   Endo/Heme/Allergies: Negative.   Psychiatric/Behavioral: Negative.     Physical Exam:  weight is 116 lb (52.6 kg). Her oral temperature is 98.7 F (37.1 C). Her blood pressure is 159/70 (abnormal) and her pulse is 68. Her respiration is 16 and oxygen saturation is 98%.   Physical Exam Vitals reviewed.  HENT:     Head: Normocephalic and atraumatic.  Eyes:     Pupils: Pupils are equal, round, and reactive to light.  Cardiovascular:     Rate and Rhythm: Normal rate and regular rhythm.     Heart sounds: Normal heart sounds.  Pulmonary:     Effort: Pulmonary effort is normal.     Breath sounds:  Normal breath sounds.  Abdominal:     General: Bowel sounds are normal.     Palpations: Abdomen is soft.  Musculoskeletal:        General: No tenderness or deformity. Normal range of motion.     Cervical back: Normal range of motion.  Lymphadenopathy:     Cervical: No cervical adenopathy.  Skin:    General: Skin is warm and dry.     Findings: No erythema or rash.  Neurological:     Mental Status: She is alert and oriented to person, place, and time.  Psychiatric:        Behavior: Behavior normal.        Thought Content: Thought content normal.        Judgment: Judgment normal.     Lab Results  Component Value Date   WBC 6.8 08/10/2021   HGB 14.5 08/10/2021   HCT 45.0 08/10/2021   MCV 90.4 08/10/2021   PLT 189 08/10/2021     Chemistry      Component Value Date/Time   NA 141 08/10/2021 1009   NA 141 07/13/2017 0743   NA 138 08/01/2015 1124   K 4.7 08/10/2021 1009   K 4.2 07/13/2017 0743   K 4.0 08/01/2015 1124   CL 103 08/10/2021 1009   CL 105 07/13/2017 0743   CO2 28 08/10/2021 1009   CO2 27 07/13/2017 0743   CO2 28 08/01/2015 1124   BUN 15 08/10/2021 1009   BUN 16 07/13/2017 0743   BUN 10.4 08/01/2015 1124   CREATININE 0.95 08/10/2021 1009   CREATININE 0.89 05/13/2021 1118   CREATININE 0.8 08/01/2015 1124      Component Value Date/Time   CALCIUM 10.3 08/10/2021 1009   CALCIUM 9.5 07/13/2017 0743   CALCIUM 9.6 08/01/2015 1124   ALKPHOS 59 08/10/2021 1009   ALKPHOS 68 07/13/2017 0743   ALKPHOS 88 08/01/2015 1124   AST 28 08/10/2021 1009   AST 23 08/01/2015 1124   ALT 17 08/10/2021 1009   ALT 19 07/13/2017 0743   ALT 12 08/01/2015 1124   BILITOT 0.7 08/10/2021 1009   BILITOT <0.30 08/01/2015 1124      Impression and Parsons: Shannon Parsons is 80 year old white female. She has mantle cell lymphoma. Again, by the [facility] she is an intermediate risk.  She received 2 different courses of chemotherapy. She could not tolerate R-CHOP with Velcade substituting for  vincristine. This however was incredibly helpful. We finished up with Rituxan/bendamustine. She completed this in August 2016.  We then started maintenance Rituxan. She started this in November 2016.  She completed this in November 2018.    I do not see  any indication for scans.  Her exam is unremarkable.  Her lab work looks Chief Technology Officer.  I know that she is doing her best to try to help her poor husband.  We will Parsons to see her back in 6 months.  Volanda Napoleon, MD 11/28/202211:24 AM

## 2021-08-17 ENCOUNTER — Ambulatory Visit: Payer: PPO | Admitting: Pharmacist

## 2021-08-17 ENCOUNTER — Other Ambulatory Visit: Payer: Self-pay

## 2021-08-17 ENCOUNTER — Other Ambulatory Visit: Payer: Self-pay | Admitting: Internal Medicine

## 2021-08-17 DIAGNOSIS — E038 Other specified hypothyroidism: Secondary | ICD-10-CM

## 2021-08-17 DIAGNOSIS — G4733 Obstructive sleep apnea (adult) (pediatric): Secondary | ICD-10-CM

## 2021-08-17 DIAGNOSIS — F411 Generalized anxiety disorder: Secondary | ICD-10-CM

## 2021-08-17 DIAGNOSIS — J4542 Moderate persistent asthma with status asthmaticus: Secondary | ICD-10-CM

## 2021-08-17 DIAGNOSIS — J449 Chronic obstructive pulmonary disease, unspecified: Secondary | ICD-10-CM

## 2021-08-17 DIAGNOSIS — R0989 Other specified symptoms and signs involving the circulatory and respiratory systems: Secondary | ICD-10-CM

## 2021-08-17 DIAGNOSIS — Z79899 Other long term (current) drug therapy: Secondary | ICD-10-CM

## 2021-08-17 DIAGNOSIS — E782 Mixed hyperlipidemia: Secondary | ICD-10-CM

## 2021-08-17 DIAGNOSIS — K589 Irritable bowel syndrome without diarrhea: Secondary | ICD-10-CM

## 2021-08-17 MED ORDER — ESCITALOPRAM OXALATE 10 MG PO TABS: ORAL_TABLET | ORAL | 3 refills | Status: DC

## 2021-08-17 NOTE — Progress Notes (Signed)
Patient Visit with Chart Prep     Shannon, Parsons [id number]  34 years, Female  DOB: 1940-11-13  M: [phone]  Care Team: Jasper Riling  Strategy, Team  .  Visit Details  Patient scheduled?for?CCM visit with the clinical pharmacist.? Patient is referred for CCM by their PCP and CPP is under general PCP supervision.: At least 2 of these conditions are expected to last 12 months or longer and patient is at significant risk for acute exacerbations and/or functional decline.  Patient has consented to participation in [place] program.  Visit Type: Phone Call  Date of Upcoming Visit: 08/17/2021  _____________________________________________________________________________________________________________________________________________________________________________________________________  Summary: Shannon Parsons is a friendly 80 year old female who present for CCM initial visit. Chief complains include anxiety and irritable bowel syndrome. She will be following with Dr. Melford Aase in 12/8 for IBS. She notes that she has noticed increased anxiety levels also increase her gut symptoms and would like to start an SSRI to treat anxiety.  Recommendations/Changes made from today's visit: Recommended that patient take 1/2 tablet of Trazodone nightly as needed rather than temazepam and other benzodiazepines Will consider pt for RPM monitor for future Recommend initiating and SSRI (Paroxetine 20mg  QD which may aid with sleep as well) for anxiety management   Patient Chart Prep (HC)  Chronic Conditions  Patient's Chronic Conditions: Chronic Obstructive Pulmonary Disease (COPD), Gastroesophageal Reflux Disease (GERD), Hypothyroidism, Osteoarthritis, Osteopenia or Osteoporosis, Hyperlipidemia/Dyslipidemia (HLD), Anxiety, Vitamin D deficiency, Tremor, Hypotension     Doctor and Hospital Visits  Were there PCP Visits in last 6 months?: Yes  Visit #1: 03/27/2021- Liane Comber, NP-Patient  presented for Chest tightness, fatigue and cough. BP 124/80, HR 80. No medication changes.   Visit #2: 04/10/2021- Liane Comber, NP-Patient presented for persistent cough. BP 128/82, HR 86.  start Promethazine-DM 6.25-15mg /53ml, take 5 mls by mouth 4 (four) times daily as needed for cough.   Visit #3: 05/13/2021-Dana Demetra Shiner, NP-Patient presented for AWV. BP 126/76, HR 74. No medication changes.   Were there Specialist Visits in last 6 months?: Yes  Visit #1: 06/04/2021- Dr. Hochrein(Cardiology)- Patient presented for chest pain. BP 120/78, HR 67. No medication changes.   Visit #2: 07/01/2021- Dr. Dohmeier(Neurology)-Patient presented for OSA. BP 133/76, HR 65. Start Trazodone 50mg  at bedtime.  Visit #3: 08/10/21-Dr. Ennever(Oncology)-Patient presented for Mantle cell lymphoma of intra-abdominal lymph nodes. BP 159/70, HR 68. No medication changes.   Was there a Hospital Visit in last 30 days?: No  Were there other Hospital Visits in last 6 months?: Yes  Visit #1: 03/27/2021- [facility] for chest pain. BP 166/83, HR 70. No medication changes noted.     Medication Information  Are there any Medication discrepancies?: No  Are there any Medication adherence gaps (beyond 5 days past due)?: No  Medication adherence rates for the STAR rating drugs: Rosuvastatin 5mg   -07/23/21, 90 DS  List Patient's current Care Gaps: No current Care Gaps identified    Pre-Call Questions [facility])  Are you able to connect with Patient: Yes  Visit Type: Phone Call  May we confirm what is the best phone # for the pharmacist to call you?: [phone]  Confirm visit date/time: 08/17/2021 at 3pm  Have you seen any other providers since your last visit?: No  Any changes in your medicines or health?: No  Any side effects from any medications?: No  Do you have any symptoms or problems not managed by your medications?: No  What are your top 2 health concerns  right now?: None at the moment  What are your top 2 medication  concerns right now?: Other  Details: N/A  Has your Dr asked that you take BP, BG or special diet at home?: Other  Details: N/A  Do you get any type of exercise on a regular basis?: No  What are your top 1 or 2 goals for your health?: None  What are you doing to try to reach these goals?: Taking medications  What, if any, problems do you have getting your medications from the pharmacy?: None  Is there anything else that you would like to discuss during the appointment?: No    Disease Assessments  Subjective Information  Current BP: 159/70  Current HR: 68  taken on: 08/10/2021  Previous BP: 133/76  Previous HR: 65  taken on: 06/30/2021  Weight: 116  BMI: 19.91  Last GFR: >60  taken on: 08/10/2021  Why did the patient present?: CCM initial visit  Marital status?: Married  Details: Did not specify  Retired??Previous work?: Retired  Chartered loss adjuster does the patient do during the day?: Pt does house work, vacuums. Tries to walk around the neighborhood daily.  Who does the patient spend their time with and what do they do?: Husband  Lifestyle habits such as diet and exercise?: Exercise: Walk 40-55 min minutes per day around the house mainly during the spring and summer. Now with the weather pt spends 30 minutes walking around the neighborhood 2-3 times a week and counts vacuuming as exercising    Diet: No restrictions. Pt eats 3 meals a day, Cooks for herself.   Breakfast: dry cereal, oatmeal, eggs on occasion. Coffee in the morning  Lunch: soup and sandwich.   Supper: Protein and veggies, fruits  Alcohol,?tobacco,?and illicit drug usage?: None  What is the patient's sleep pattern?: Trouble staying asleep, Other (with free form text)  Details: Wakes up to use the bathroom.  takes as needed - Temazepam. Uses CPAP. Has gotten better 5-6 hours now  How many hours per night does patient typically sleep?: 4 hours per night. She has been getting 5-6 hours some days.  Patient pleased with health  care?they are?receiving?: Yes  Family, occupational,?and living circumstances relevant to?overall health?: Herfamily history includes Emphysema in her father; Heart attack (age of onset: 88) in her father; Heart disease in her father; Heart disease (age of onset: 63) in her brother; Hypertension in her mother; Rheum arthritis in her sister; Thyroid disease in her mother.  Factors?that may?affect?medication?adherence?: Pill burden, Complexity of med regimen  Name and location of Current pharmacy: [facility], [place]   Current Rx insurance plan: HTA  Are meds synced by current pharmacy?: No  Are meds delivered by current pharmacy?: No - delivery available but patient prefers to not use  Would patient benefit from direct intervention of clinical lead in dispensing process to optimize clinical outcomes?: Yes  Are [facility] pharmacy services available where patient lives?: Yes  Is patient disadvantaged to use [facility]?: No  [facility] services reviewed with patient and patient wishes to change pharmacy?: No  Select reason patient declined to change pharmacies: Prefers to go to the pharmacy / reason to leave the house  Does patient experience delays in picking up medications due to transportation concerns (getting to pharmacy)?: No  Any additional demeanor/mood notes?: Shannon Parsons is a friendly 80 year old female who present for CCM initial visit. Chief complains include anxiety and irritable bowel syndrome. She will  be following with Dr. Melford Aase in 12/8 for IBS. She notes that she has noticed increased anxiety levels also increase her gut symptoms and would like to start an SSRI to treat anxiety.    Hyperlipidemia/Dyslipidemia (HLD)  Last Lipid panel on: 05/12/2021  TC (Goal<200): 187  LDL: 102  HDL (Goal>40): 67  TG (Goal<150): 90  ASCVD 10-year risk?is:: High (>20%)  ASCVD Risk Score: 37.4%  Assess this condition today?: Yes  LDL Goal: <70   Has patient tried and failed any HLD Medications?: No  Check present secondary causes (below) that can lead to increased cholesterol levels (multi-choice optional): Hypothyroidism  We discussed: How to reduce cholesterol through diet/weight management and physical activity., How a diet high in plant sterols (fruits/vegetables/nuts/whole grains/legumes) may reduce your cholesterol., Encouraged increasing fiber to a daily intake of 10-25g/day  Assessment:: Uncontrolled  Drug: Rosuvastatin 5mg  take 1/4 tablet daily  Assessment: Appropriate, Query Effectiveness  HC Follow up: N/A  Pharmacist Follow up: Follow up with lipid levels. LFT, s/s rhabdo. Pt unable to tolerate higher doses of rosuvastatin, consider adding ezetimibe for further lipid reduction    Chronic Obstructive Pulmonary Disease (COPD)  Current Eosinophils: 0.1  taken on: 08/10/2021  Assess this condition today?: Yes  Gold group: A (low sx, < 2 exacerbations / yr)  Exacerbations in past year without hospitalization: No  Is patient currently Smoking or Vaping?: No  Did patient smoke/vape before?: No  Home oxygen therapy: No  Frequency of SABA/SAMA use: Never  Influenza vaccine: Yes  Prevnar vaccine: Yes  Pneumovax vaccine: Yes  We counseled the patient on:: Inhaler technique  Assessment:: Controlled  Drug: Albuterol inhaler as needed  Assessment: Appropriate, Effective, Safe, Accessible  Additional Info: Obstructive bronchiectasis (diagnosed 7/17)  Moderate persistent asthma with status asthmaticus (diagnosed 7/21)  HC Follow up: N/A  Pharmacist Follow up: Continue to monitor for s/s wheezing    Anxiety  Assess this condition today?: Yes  Completing the GAD-7 Questionnaire today?: Yes  GAD-7: Over the last 2 weeks, how often have you been bothered by the following problems?: Done  Feeling nervous, anxious or on edge: 3 (Nearly every day)  Not being able to stop or control worrying: 1 (several days)  Worrying too much  about different things: 1 (several days)  Trouble relaxing: 0 (Not at all)  Being so restless that it is hard to sit still: 1 (several days)  Becoming easily annoyed or irritable: 1 (several days)  Feeling afraid, as in something awful might happen: 0 (Not at all)  Total GAD-7 Score (please total responses for questions above): 7  Anxiety Severity: Mild Anxiety (Score 5-9)  In your opinion, how do you feel your anxiety symptoms have been controlled over the past 3 months?: Worsened  Assessment:: Uncontrolled  Drug: Temazepam 15mg  QD  Assessment: Appropriate, Query Effectiveness  Drug: Trazodone 50mg  1 tab HS PRN  Assessment: Appropriate, Query Effectiveness  Additional Info: Husband has problems with Alzheimer dementia and he will not seek treatment. He has been having forgetfulness and memory loss and this has been adding to her anxiety.  Plan to Counsel: Counseled the patient to take trazodone if needed rather than temazepam and other benzodiazepines.  Plan to (other): Will reach out to provider to start an SSRI for anxiety management. Recommend Paroxetine 20mg  QD  HC Follow up: Anxiety assessment February  Pharmacist Follow up: N/A    GERD  Assess this condition today?: Yes  How frequently do you have symptoms of GERD or reflux?:  daily  When does patient experience reflux symptoms?: After meals  Does patient experience difficulty or pain with swallowing?: No  Does patient experience any of the following?: Bloating or gassy feeling  What OTC medications have you tried and what was the response?: None  Is the patient a smoker or exposed to second-hand smoke?: No  How long have you taken prescription medications for GERD?: 2015  What potential triggering foods have you identified and tried to limit intake in your diet?: none  We discussed: Identifying and avoiding / limiting trigger foods, Wearing loose fitting clothes, Eating smaller, more frequent meals  Assessment:: Uncontrolled   Drug: Acidophilus 1 capsule daily  Assessment: Appropriate, Query Effectiveness  Drug: Hyoscyamine 0.125 mg Q4h PRN cramping, bloating  Additional Info: IBS  Pt endorses pressure in stomach, constipation  Pt states some foods like bread maybe affecting.   Pt states it is a gut over mind issue, when anxiety flares, she has really bad issues with gut as well.  HC Follow up: N/A  Pharmacist Follow up: Pt to follow with provider on 08/20/2021.    Hypothyroidism  Current TSH: 1.51  taken on: 05/12/2021  Previous TSH: 4.05  taken on: 11/10/2020  Assess this condition today?: Yes  Patient has experienced the following symptoms: Constipation, Hyperlipidemia  We discussed: Monitoring for signs / symptoms of hypothyroidism (weight gain, fatigue, hair loss, cold intolerance), Monitoring signs / symptoms of hyperthyroidism (weight loss, tachycardia, anxiety, increased sweating)  Assessment:: Controlled  Drug: Levothyroxine 97mcg QD  Assessment: Appropriate, Effective, Safe, Accessible  HC Follow up: N/A  Pharmacist Follow up: Follow up TSH and s/s of hyper/hypothyroid    Hypotension  Pertinent Labs: Pt BP have been ranging 120-130/70s. Just recently is was 140/85.   Other Information: Patient is not having hypotension any more. Pt BP ranges in the pre-htn stages and has been having some elevated readings. Encouraged patient to bring in BP log at next visit. May need to start patient on BP med if persistently above 140/80 mmHg  Assessment:: Controlled  Drug: None  HC Follow up: HTN assessment January. Record list of BP readings   Pharmacist Follow up: Continue to follow BP reading in office and at home.     Exercise, Diet and Non-Drug Coordination Needs  Additional exercise counseling points. We discussed: targeting at least 150 minutes per week of moderate-intensity aerobic exercise., incorporating flexibility, balance, and strength training exercises, decreasing sedentary behavior  Discussed  Non-Drug Care Coordination Needs: Yes  Does Patient have Medication financial barriers?: No    Engagement Notes  Newton Pigg on 08/17/2021 03:51 PM  HC Chart Prep: 68min.  CPP Chart Review: 23 min   CPP Office Visit: 41 min   CPP Office Visit Documentation: 77 min   CPP Coordination of Care: 4 min   [facility] Care Plan Completion: 20 min  CPP Care Plan Review:  15 min    HC F/u:  HTN assessment January. Record list of BP readings Anxiety assessment February  CPP F/u:  08/20/21: OV w/ [facility] 11/17/21: OV w/ [facility] 03/02/21 @ 3pm:   CCM f/u visit (assess insomnia, anxiety, IBS, BP readings    Care Gaps:  COVID Booster: Pt is still thinking about this vaccine Shingrix: Pt will get next year. Current copay is 100 dollars for vaccine        Patient Name:??Shannon Parsons, Shannon Parsons             DOB:??Oct 31, 1940  ??  Last Care Plan Update:??08/20/2021  COMPREHENSIVE CARE  PLAN AND GOALS??  ?  ?  CHOLESTEROL??  MOST RECENT LABS:?05/12/2021???  TOTAL CHOLESTEROL: 187  TRIGLYCERIDES: ?90  HDL: 67?  LDL: 102?  CURRENT MEDICATION AND DOSING: ?Rosuvastatin 5mg  take 1/4 tablet daily?    THE GOALS WE HAVE CHOSEN ARE:????  Total Cholesterol goal under 200, Triglycerides goal under 150, HDL goal above 40, LDL goal under 100??  BARRIERS TO ACHIEVING GOALS:??  Uncontrolled  PLAN TO WORK ON THESE GOALS:??  Take medication regularly??  Diet: high in plant sterols (fruits/ vegetables/ nuts/ whole grains/ legumes). Increase fiber intake (10-25g/day). Avoid foods high in cholesterol (red meat, egg yolks, dairy, oils/ butter). Choose low-fat options.??  Exercise??  Weight Management?  .?  ?  ??  ??GERD??  CURRENT REGIMEN AND DOSING:??Acidophilus 1 capsule daily, Hyoscyamine 0.125 mg Q4h PRN cramping, bloating    THE GOALS WE HAVE CHOSEN ARE:???  Minimize reflux symptoms.??  BARRIERS TO ACHIEVING GOALS:??  Un-Controlled  PLAN TO WORK ON THESE GOALS:????  Educated on ways to prevent acid reflux, such as  avoid spicy foods, smaller portion sizes, wearing loose clothing, avoiding caffeine, and to raise head of bed.??    ??  ??  COPD??  EOSINOPHIL COUNT:????????????????????  EOS ABSOLUTE: 0.1 on 08/10/2021  CURRENT MEDICATION AND DOSING:??Albuterol inhaler as needed    THE GOALS WE HAVE CHOSEN ARE:??  Reduce shortness of breath and reduce hospitalizations due to COPD exacerbations??  BARRIERS TO ACHIEVING GOALS:??  Controlled  PLAN TO WORK ON THESE GOALS:????  Use inhalers regularly as directed??  Educated on inhaler usage and discussed technique??  Educated on ways to prevent exacerbation of COPD??  Stay up to date on influenza and pneumococcal immunizations??  ?  Call CPP or MD immediately if: more coughing, wheezing, SOB than usual; changes in color, thickness or amount of mucus; feeling tired for more than one day; swelling of legs or ankles; trouble sleeping; feeling the need to increase oxygen (low oxygen levels on pulse ox)??  Call 911: severe shortness of breath or chest pain; blue color in lips, fingers; confusion, disorientation, difficulty speaking in full sentences??  ?  ??  ?  HYPOTHYROIDISM??  RECENT TSH/DATE:??  1.51 on 05/12/21?????  CURRENT MEDICATION AND DOSING:??Levothyroxine 55mcg daily  ?  THE GOALS WE HAVE CHOSEN ARE:???  Maintain TSH between 0.45 to 4.5uIU/ml??  BARRIERS TO ACHIEVING GOALS:??  Controlled  PLAN TO WORK ON THESE GOALS:????  Continue Medication??  Take medication with water at the same time each day for consistent absorption at least 1 hour before breakfast or at bedtime (3 hours after last meal)??  ?  Recognize signs and symptoms of thyroid imbalance:??  Hyperthyroid: Heat intolerance (increased sweating), weight loss, agitation/ nervousness/ irritability/ anxiety, palpitations/ tachycardia, fatigue/ muscle weakness, diarrhea, insomnia, tremor, thinning hair, exophthalmos??  Hypothyroid: cold intolerance, dry skin, fatigue, muscle cramps, voice  changes, constipation, weight gain, myalgias, weakness, depression, bradycardia, hair loss, memory/ mental impairment?  ??  ??  Anxiety??  CURRENT REGIMEN AND DOSING:??Temazepam 15mg  daily, Trazodone 50mg  1 tab HS PRN    THE GOALS WE HAVE CHOSEN ARE:??  Lower and manage symptoms of anxiety??  PLAN TO WORK ON THESE GOALS:????  Continue medication as prescribed??  Inform practitioner of any signs and symptoms of worsening anxiety??  Symptoms: fear, worry, tachycardia, palpitations, SOB, stomach upset, chest pain, insomnia, fatigue??  Lifestyle changes: increasing physical activity, community involvement, yoga and meditation?  ??    Hypotension ?  CURRENT REGIMEN AND DOSING:??None    THE GOALS WE  HAVE CHOSEN ARE:??  Maintain a normal blood pressure and avoid side effects such as falls, dizziness, and lightheadedness  PLAN TO WORK ON THESE GOALS:????  Monitor blood pressure daily and keep a log. Inform provider if blood pressure falls below 90/60 or if blood pressure consistently greater than 130/80  ?  ???  ACTIVE MEDICATION LIST??  Your current medication list has been updated. To view, log in to your patient portal.??  Call if any changes need to be made.?  ?  ?  MEDICATION REVIEW??  MEDICATION REVIEW CONDUCTED:?? Yes  ?  DATE:??08/17/2021  BEST POSSIBLE MEDICATION HISTORY  SOURCE:?? Medical Records?  ???  ?  HOW DO I? - WHEN DO I???  ?  GET AHOLD OF MY DOCTOR?  ?  DURING BUSINESS HOURS WHEN THE OFFICE IS OPEN    PHONE: [phone]?  AFTER HOURS [facility] NURSE WHEN THE OFFICE IS CLOSED?  PHONE: [phone]??  TALK TO MY CARE COORDINATOR NAME: Newton Pigg ?  PHONE: [phone]?  EMAIL:? [email or web address]  CARE COORDINATOR STAFF   NAME: [name]??  PHONE: [phone]?  ?  NOTE SECTION??  Thank you for participating in the Chronic Care Management (CCM) program with Dr. Melford Aase  ??  This program takes a proactive approach to your health and my team  will serve as a resource for you throughout the year. Please follow up at [phone] if you have any questions or experience changes to your overall health. Your next CCM appointment will be conducted with Newton Pigg, PharmD as follows:??  ??  Date: 03/02/2022 ??  Time: 3:00pm ??  Over the Phone???    Rachelle Hora Jeannett Senior, PharmD  Clinical Pharmacist  [email or web address]  [phone]

## 2021-08-20 ENCOUNTER — Ambulatory Visit (INDEPENDENT_AMBULATORY_CARE_PROVIDER_SITE_OTHER): Payer: PPO | Admitting: Internal Medicine

## 2021-08-20 ENCOUNTER — Other Ambulatory Visit: Payer: Self-pay

## 2021-08-20 ENCOUNTER — Encounter: Payer: Self-pay | Admitting: Internal Medicine

## 2021-08-20 ENCOUNTER — Telehealth: Payer: Self-pay

## 2021-08-20 VITALS — BP 158/76 | HR 81 | Temp 97.9°F | Resp 16 | Ht 64.0 in | Wt 114.4 lb

## 2021-08-20 DIAGNOSIS — F419 Anxiety disorder, unspecified: Secondary | ICD-10-CM | POA: Diagnosis not present

## 2021-08-20 DIAGNOSIS — R0989 Other specified symptoms and signs involving the circulatory and respiratory systems: Secondary | ICD-10-CM

## 2021-08-20 DIAGNOSIS — K589 Irritable bowel syndrome without diarrhea: Secondary | ICD-10-CM | POA: Diagnosis not present

## 2021-08-20 NOTE — Progress Notes (Signed)
Reviewing patients CPP visit. Care plan created and uploaded for CPP review.   [name], Health Concierge [phone]

## 2021-08-20 NOTE — Progress Notes (Signed)
Future Appointments  Date Time Provider Department  08/20/2021 10:30 AM Unk Pinto, MD [facility]  11/17/2021           CPE 11:00 AM Unk Pinto, MD [facility]  01/14/2022 10:45 AM Volanda Napoleon, MD [facility]  03/02/2022  3:00 PM Newton Pigg, [facility]  05/13/2022 10:00 AM Magda Bernheim, NP [facility]  07/07/2022 11:00 AM Debbora Presto, NP [facility]    History of Present Illness:     Patient is a very nice 80 yo MWF  with episodes of oth diarrhea & constipation. labile HTN, HLD, Hypothyroidism, Prediabetes,  Essential Hereditary Tremor ,OSA/CPAP  and Vitamin D Deficiency. Patient is followed by Dr Marin Olp for hx/ [facility] Cell lymphoma. Patient has IBS, mixed with c/o occasional abdominal cramping or constipation Patient admits her triggers are anxiety dealing with her husband and his worsening Dementia. She has a Rx for Lexapro , but has not beemn taking it & is encouraged to take it.  Medications  Current Outpatient Medications (Endocrine & Metabolic):    levothyroxine (SYNTHROID) 50 MCG tablet, Take  1 tablet  Daily  on an empty stomach with only water for 30 minutes & no Antacid meds, Calcium or Magnesium for 4 hours & avoid Biotin  Current Outpatient Medications (Cardiovascular):    rosuvastatin (CRESTOR) 5 MG tablet, TAKE 1/4 TABLET DAILY FOR CHOLESTEROL   Current Outpatient Medications (Analgesics):    acetaminophen (TYLENOL) 325 MG tablet, Take 650 mg by mouth at bedtime as needed for mild pain.   Current Outpatient Medications (Other):    acidophilus (RISAQUAD) CAPS capsule, Take 1 capsule by mouth daily.   Cholecalciferol (VITAMIN D3) 50 MCG (2000 UT) capsule, Take 4,000 Units by mouth daily.    diazepam (VALIUM) 5 MG tablet, Take 5 mg by mouth 3 (three) times daily as needed for muscle spasms.    escitalopram (LEXAPRO) 10 MG tablet, Take  1 tablet  Daily  for Chronic Anxiety    hyoscyamine (LEVSIN SL) 0.125 MG SL tablet, Take    1 to 2 tablets     every 4  hours       as needed for Nausea, Cramping, Bloating or Diarrhea DIARRHEA     LORazepam (ATIVAN) 0.5 MG tablet, lorazepam 0.5 mg tablet  TAKE 1 TABLET BY MOUTH EVERY 8 HOURS AS NEEDED FOR SLEEP (NAUSEA OR VOMITING).   Magnesium 250 MG TABS, Take 1 tablet by mouth daily.   vitamin C (ASCORBIC ACID) 500 MG tablet, Take 500 mg by mouth daily.  Problem list She has Chronic obstructive airway disease with asthma ([place]); History of colonic polyps; Hyperlipidemia, mixed; Hypotension; GERD; Osteoporosis  (Refuses treatment); Osteoarthritis; Vitamin D deficiency; IBS; Diverticulosis; Anxiety state; OSA and COPD overlap syndrome ([place]); Medication management; Mantle cell lymphoma ([place]); Normocytic anemia; History of DVT (deep vein thrombosis); Body mass index (BMI) of 20.0-20.9 in adult; Obstructive bronchiectasis ([place]); Acquired hypogammaglobulinemia ([place]); Atherosclerosis of aorta ([place]) by PET Scan 04/21/2018.; Sinusitis, maxillary, chronic; Cervical dystonia; History of lymphoma; Tremor; Hypothyroidism; Abnormal glucose; Labile hypertension; Bronchiectasis with acute lower respiratory infection ([place]); OSA (obstructive sleep apnea); Moderate persistent asthma with status asthmaticus; Chemotherapy-induced lung disease; and Precordial chest pain on their problem list.   Observations/Objective:  BP (!) 158/76   Pulse 81   Temp 97.9 F (36.6 C)   Resp 16   Ht 5\' 4"  (1.626 m)   Wt 114 lb 6.4 oz (51.9 kg)   LMP 09/14/2014   SpO2 96%   BMI 19.64 kg/m  HEENT - WNL. Neck - supple.  Chest - Clear equal BS. Cor - Nl HS. RRR w/o sig MGR. PP 1(+). No edema. MS- FROM w/o deformities.  Gait Nl. Neuro -  Nl w/o focal abnormalities.   Assessment and Plan:  1. Irritable bowel syndrome, unspecified type  Discussed High Fiber diet & advised her to take her Lexapro   2. Labile hypertension   3. Chronic anxiety   Follow Up Instructions:        I discussed the assessment and treatment plan with the  patient. The patient was provided an opportunity to ask questions and all were answered. The patient agreed with the plan and demonstrated an understanding of the instructions.       The patient was advised to call back or seek an in-person evaluation if the symptoms worsen or if the condition fails to improve as anticipated.    Kirtland Bouchard, MD

## 2021-08-25 ENCOUNTER — Other Ambulatory Visit: Payer: Self-pay | Admitting: Internal Medicine

## 2021-08-25 MED ORDER — TRAZODONE HCL 150 MG PO TABS: ORAL_TABLET | ORAL | 0 refills | Status: DC

## 2021-09-10 DIAGNOSIS — E782 Mixed hyperlipidemia: Secondary | ICD-10-CM | POA: Diagnosis not present

## 2021-09-10 DIAGNOSIS — G4733 Obstructive sleep apnea (adult) (pediatric): Secondary | ICD-10-CM | POA: Diagnosis not present

## 2021-09-10 DIAGNOSIS — K589 Irritable bowel syndrome without diarrhea: Secondary | ICD-10-CM | POA: Diagnosis not present

## 2021-09-10 DIAGNOSIS — J449 Chronic obstructive pulmonary disease, unspecified: Secondary | ICD-10-CM | POA: Diagnosis not present

## 2021-09-11 ENCOUNTER — Telehealth: Payer: Self-pay

## 2021-09-11 NOTE — Progress Notes (Signed)
Care plan printed and mailed to patient.   Shannon Parsons, Health Concierge [phone]

## 2021-09-24 DIAGNOSIS — G4733 Obstructive sleep apnea (adult) (pediatric): Secondary | ICD-10-CM | POA: Diagnosis not present

## 2021-09-24 DIAGNOSIS — J449 Chronic obstructive pulmonary disease, unspecified: Secondary | ICD-10-CM | POA: Diagnosis not present

## 2021-09-24 DIAGNOSIS — J471 Bronchiectasis with (acute) exacerbation: Secondary | ICD-10-CM | POA: Diagnosis not present

## 2021-10-06 ENCOUNTER — Ambulatory Visit: Payer: PPO | Admitting: Neurology

## 2021-10-06 ENCOUNTER — Encounter: Payer: Self-pay | Admitting: Neurology

## 2021-10-06 VITALS — BP 145/79 | HR 82 | Ht 64.0 in | Wt 116.5 lb

## 2021-10-06 DIAGNOSIS — F5104 Psychophysiologic insomnia: Secondary | ICD-10-CM | POA: Insufficient documentation

## 2021-10-06 DIAGNOSIS — Z9989 Dependence on other enabling machines and devices: Secondary | ICD-10-CM | POA: Diagnosis not present

## 2021-10-06 DIAGNOSIS — J984 Other disorders of lung: Secondary | ICD-10-CM

## 2021-10-06 DIAGNOSIS — R519 Headache, unspecified: Secondary | ICD-10-CM | POA: Diagnosis not present

## 2021-10-06 DIAGNOSIS — G4733 Obstructive sleep apnea (adult) (pediatric): Secondary | ICD-10-CM | POA: Diagnosis not present

## 2021-10-06 DIAGNOSIS — T451X5A Adverse effect of antineoplastic and immunosuppressive drugs, initial encounter: Secondary | ICD-10-CM

## 2021-10-06 MED ORDER — TRAZODONE HCL 150 MG PO TABS: ORAL_TABLET | ORAL | 5 refills | Status: DC

## 2021-10-06 NOTE — Patient Instructions (Signed)

## 2021-10-06 NOTE — Progress Notes (Signed)
SLEEP MEDICINE CLINIC   Provider:  Larey Seat, M D  Primary Care Physician:  Unk Pinto, MD   Referring Provider: Unk Pinto, MD    Chief Complaint  Patient presents with   Obstructive Sleep Apnea    Rm 10, alone. Pt reports CPAP has been causing terrible headaches. Initially believed it was from taking Lexapro she weaned this past weekend and continues to have a HA. Has a HA now. Takes tylenol. Pressure around the face and nose. R eye feels swollen and pressure.     HPI:  Shannon Parsons is a 81 y.o. female patient , and here for a RV  10-06-21 on her apnea; she underwent a sleep study-: she had been a cpap user for many years before we re-evaluated her.  Last test was a HST on 02-18-2020  There was still a severe sleep apnea noted ( in comparison to PSG ) , AHI 40/h and REM sleep AHI of 36/h. Supine sleep is most prone to apnea. There was no prolonged hypoxemia.    Shannon Parsons is a 81 y.o. 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 Lavone Neri as a pulmonologist.  She has a history of hypertension, GERD, prediabetes, diverticulosis, generalized anxiety, cholesterol and vitamin D and is being treated almost being treated for a mantle cell carcinoma is Dr. Burney Gauze.  The treatment has been complicated when she developed a deep venous thrombosis.   She had received the Covid vaccine in February, she has been now fully vaccinated.  For the last 6 months she has not been feeling well it may by now be 8 months.  Her main problem is trouble sleeping and her CPAP has not been used in over 2 years. There was a good reason for her to avoid CPAP as she felt that it increased her frequency of bronchitis.  She has often sporadic nausea and is vomiting sometimes -and she has felt that lorazepam helped to overcome nausea as well as anxiety and introducing  sleep.  These nausea spells happen not infrequently about 4 hours after she had a meal and she does not think it specific to any foods.  She has no trouble swallowing food.   Shannon Parsons has mantel cell carcinoma, completed her therapy, remains very anxious and hypervigilant. Uses CPAP with reluctance- but reached an AHI of 0.5 /h ! Down from 40-50 AHI prior to therapy. She wakes up after 4-5 hours and takes the mask off, but feels unable to sleep longer.  Last morning she woke up with headaches, has had higher BP and thinks it is CPAP causing her to have these. Pulsation sensation and headaches affect the right eye pressure, and top of the head.  Her husband told her the right eye was "smaller".  She discontinued lexapro with Dr Melford Aase, after she felt this let to more headaches , no effect on insomnia, was only on medication for 4 weeks. Titubation tremor of the head is noted.   She is not wanting INSPIRE-  she doesn't use dentures and could benefit from a dental device.  She is afraid she couldn't afford it and that she would not tolerate a dental device. She elegantly directed the discussion to Xanax.  Trazodone should be used regularly- she can find help with dry mouth, dry eyes, and Trazodone can help with migraines and is memory friendly.  She has no history of glaucoma that may worsen under trazodone.  .               She underwent a sleep study on 6-7 2021 in form of a home sleep test.  I was mildly surprised that she had severe sleep apnea with an AHI of 50/h and REM sleep AHI of 36/h.  Also she is providing very good effort and using her CPAP she still has never felt that she gets more than 4 hours of sleep and interrupted sleep.  She feels that she still struggles with the machine she is not excessively daytime sleepy, today's Epworth score score was endorsed at 4 points she is not depressed, and she locked 24 out of 30 days on her compliance data.  On average she just  manages 3 hours and 33 minutes.  So she is actually using it less than 4 hours at night.  The settings have remained 6 to 14 cm with 3 cm EPR, the residual AHI is very low 0.3/h and she does have significant air leakage from her mask.  95th percentile air leak is 28 L/min.  The pressure she uses is 11 cmH2O on the 95th percentile.  She did not have a REM dependent form of sleep apnea and she did not have a significant hypoxemia. She has a history of asthma and some "scarring on the lung" but not COPD> yet there is clear recorded lung disease ? If related  to chemotherapy.  Her cardiologist told her she is fine.   Insomnia precedes the OSA diagnosis, and we should consider using a sleep aid.  Her oncologist gave her a sleep aid prescription and she is reluctant to use TEMAZEPAM pills to sleep.  I assured her we have non addictive options such as RAZODONE, Belsomra. She is a light sleeper.   Supine sleep was the most prone apnea position.  There was no prolonged hypoxemia and I asked her to resume CPAP she uses a auto titration CPAP and the minimum pressure set at 6 the maximum pressure of 18 cmH2O with 3 cm expiratory pressure relief.  She has been highly compliant 97% of the days 87% compliance by hours with an average usage time of 5 hours 23 minutes each night.  Her residual AHI is 0.6/h which is excellent she has no central apneas among the residual.  Her 95th percentile pressure is 10.3 cm water - so we can really reduce the maximum pressure of her CPAP. She does have moderate to severe air leakage and wonders about a different mask. She has nasal pillows. The headgear is loosing it's elasticity quickly- She already switched and  uses a Bella swift with ear loops in XS size. Shannon Parsons  She has dry eyes , but no aerophagia.      Whom I have encountered last in November 2019 so about 18 months ago.  At the time she felt that her sleep had improved to a level where another evaluation would not be needed.  My  diagnosis at the time was a breathing disorder related to chemotherapy induced lung changes and she has followed Dr. Lavone Neri as a pulmonologist.  She is meanwhile 81 years old, has a history of hypertension, GERD, prediabetes, diverticulosis, generalized anxiety, cholesterol and vitamin D and is being treated almost being treated for a mantle cell carcinoma is Dr. Burney Gauze.  The treatment has been  complicated when she developed a deep venous thrombosis.   She had received  the Covid vaccine in February, she has been now fully vaccinated.  For the last 3 to 6 months she has not been feeling well it may by now be 6 to 8 months.  Her main problem is trouble sleeping and her CPAP has not been used in over 2 years.  There was a good reason for her to avoid CPAP as she felt that it increased her frequency of bronchitic infections.  She has often sporadic nausea and a feeling of being sick to her stomach vomiting sometimes and she has felt that lorazepam helped to overcome nausea as well as anxiety and introducing sleep.  These nausea spells happen not infrequently about 4 hours after she had a meal and she does not think it specific to any foods.  She has no trouble swallowing food.   Since she has a history of now untreated sleep apnea ( she is not on a CPAP)  her primary care office ,  [facility], wanted her sleep reevaluated.   The patient has been living with a lot more stress, neurology and anxiety since her husband has dementia symptoms. Her main concern is insomnia, not physically related to any breathing difficulties but to IBS, frequent stools at night , and causing anxiety. She feels overwhelmed by taking on his appointments, his financial papers, the tax returns. She goes to bed at 10 and often is still awake at midnight, every 2 hours waking up, and sometimes being unable to return to sleep for an hour in between, she is exhausted.   She was seen here on 07-20-2018 in a referral  from Dr.  Melford Aase for transfer of sleep apnea care.  Chief complaint according to patient : " I don't like using it ( CPAP ), and I do have frequent Bronchitis. I have lymphoma. I never saw a sleep doctor and was given an AUTO CPAP without titration. My baseline AHI 5 years ago was 16.0 / h". "I may not need it anymore ? " I had the pleasure of seeing [name] at [facility] on the 20 July 2018, as a new patient to my sleep clinic.   She states that over 5 years ago she had undergone an overnight polysomnographic study at [facility] long hospital location, interpreted by Dr. Baird Lyons.  She recalls that she had only mild apnea at an AHI of 16/h and was prescribed an auto CPAP soon after.  She had used the machine compliantly for several years but then developed more and more often upper airway infections bronchitis and sinusitis and felt that this may be triggered by CPAP use.  Just by end of September she restarted using CPAP to give [place] some data to.  From 20 September through October she had used the machine 31 out of 35 days with an average daily use of time of 5 hours and 19 minutes, set pressure of 10 cm is seen on her download with 3 cm EPR and very high air leaks are noted.  The residual AHI is only 0.2/h a good resolution of apnea as noted but the air leaks are quite substantial.  She is using nasal pillows that were fitted especially for her to be worn with loops around the ears rather than a strap on the back of her head.   Sleep/ Medical history: 1)The patient had developed a plethora of new medical problems over the last 5 to 7 years.  She was diagnosed with atherosclerosis of the ascending aorta,  2)chronic  obstructive airway disease with asthma -she is using inhalers as needed and continues to follow-up with pulmonary care.   3) hypotension blood pressure at home is as low as 90/60 mmHg.  She did continue with the DASH diet restricting her sodium intake, she does keep hydrated.   4)She has  hypercholesterolemia on rosuvastatin-Crestor.  5) Prediabetes.  6) Hypothyroidism. 7) Vitamin D deficiency.   8)The patient was diagnosed in January 2016 with a mantle cell Lymphoma and by September 2016 was involved and chemotherapy with Dr. Marin Olp and maintained through November 2018 she did achieve remission.  Dr. Melvyn Novas  has followed her for frequent pulmonary infections with diagnosis of obstructive bronchiectasis and asthma.  Her BMI is 21.45. 9) Tremor evaluation , had it for years, seen by R.Tat, DO- she confirmed is was not PD, but essential tremor.  10) asthma all my life. I reviewed her medication and her recent lab results which included TSH of 2.73, a vit D  level of 55, HbA1c at 5.8, total cholesterol 241.   Sleep habits are as follows: Dinner time at 6 PM, bedtime is 10 PM. The bedroom is cool, quiet and dark- no TV in the bedroom.  She has preferred reading in bed, she sleeps supine since using CPAP, on two pillows. Most nights asleep within 30 minutes. She has one nocturia , and goes back to sleep- while not using CPAP recently. No gasping, no palpitation, no chest pains.  She rises at 7 AM , feeling now refreshed and better than when she used CPAP. ( Recovering from bronchitis, rhinitis, and asthma. She was given Rituximab - immune suppressed).  Rarely napping in daytime. If  Taking a nap it will last 1 hour.    Family sleep history: no family members with sleep apnea.   Social history: married, childless, husband has health problems, too. No pets in the home.  She drinks coffee 2 cups a day, iced tea rarely- decaff only, and sodas - none.  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    Review of Systems: Out of a complete 14 system review, the patient complains of only the following symptoms, and all other reviewed systems are negative.   The patient reports that she sometimes has insomnia but it is usually when she is under stress or something worries her a lot, she  knows that she is snoring but she has not complained of snoring herself awake or feeling that snoring impairs her quality of sleep.   She has high cholesterol but had no vascular or arterial disease is related to it.   She had lymphoma was diagnosed 7.5 years ago and is in remission.   INSOMNIA for decades.   Epworth score 3/ 24 - remains low  ,  Fatigue severity score 18/ 63   ,  depression score 5/15 , high anxiety, fear of cancer return.  Dementia - caregiver - husband is affected and had behavior changes, abrupt, gets mad at her when he misplaced things. .  Tremor-Titubation.    Social History   Socioeconomic History   Marital status: Married    Spouse name: Not on file   Number of children: 0   Years of education: 14   Highest education level: Not on file  Occupational History   Occupation: retired from office work  Tobacco Use   Smoking status: Never   Smokeless tobacco: Never   Tobacco comments:    Never Used Tobacco  Vaping Use  Vaping Use: Never used  Substance and Sexual Activity   Alcohol use: No    Alcohol/week: 0.0 standard drinks   Drug use: No   Sexual activity: Not on file  Other Topics Concern   Not on file  Social History Narrative   Patient lives with her husband in a two story home.  Has no children.  Retired from office work.  Education: some college.    Social Determinants of Health   Financial Resource Strain: Not on file  Food Insecurity: No Food Insecurity   Worried About Charity fundraiser in the Last Year: Never true   Ran Out of Food in the Last Year: Never true  Transportation Needs: Not on file  Physical Activity: Not on file  Stress: Not on file  Social Connections: Not on file  Intimate Partner Violence: Not on file    Family History  Problem Relation Age of Onset   Emphysema Father    Heart disease Father    Heart attack Father 1       Died   Heart disease Brother 60   Rheum arthritis Sister    Hypertension Mother     Thyroid disease Mother    Colon cancer Neg Hx    Stomach cancer Neg Hx     Past Medical History:  Diagnosis Date   Adenomatous colon polyp 1994   Asthma    Cataract 2013   bilateral eyes   Diverticulosis    Family history of ischemic heart disease    Fibrocystic breast disease    GERD (gastroesophageal reflux disease)    Hiatal hernia    Hyperlipidemia    Hypertension    pt denies - 01/16/18   IBS (irritable bowel syndrome)    Internal hemorrhoids    Mantle cell lymphoma ([place]) 10/18/2014   Basal skin cancer     Facial, pt denies on 01/16/18   OSA (obstructive sleep apnea)    Osteoarthritis    Osteoporosis    Prediabetes    Unspecified hypothyroidism    Vitamin D deficiency     Past Surgical History:  Procedure Laterality Date   APPENDECTOMY     BUNIONECTOMY     CATARACT EXTRACTION, BILATERAL     IR REMOVAL TUN ACCESS W/ PORT W/O FL MOD SED  03/04/2020   KNEE ARTHROSCOPY     TONSILLECTOMY     UMBILICAL HERNIA REPAIR     VIDEO BRONCHOSCOPY Bilateral 10/12/2017   Procedure: VIDEO BRONCHOSCOPY WITHOUT FLUORO;  Surgeon: Tanda Rockers, MD;  Location: [facility];  Service: Endoscopy;  Laterality: Bilateral;    Current Outpatient Medications  Medication Sig Dispense Refill   acetaminophen (TYLENOL) 325 MG tablet Take 650 mg by mouth at bedtime as needed for mild pain.     acidophilus (RISAQUAD) CAPS capsule Take 1 capsule by mouth daily.     Cholecalciferol (VITAMIN D3) 50 MCG (2000 UT) capsule Take 4,000 Units by mouth daily.      diazepam (VALIUM) 5 MG tablet Take 5 mg by mouth 3 (three) times daily as needed for muscle spasms.     hyoscyamine (LEVSIN SL) 0.125 MG SL tablet Take    1 to 2 tablets     every 4 hours       as needed for Nausea, Cramping, Bloating or Diarrhea DIARRHEA 90 tablet 0   levothyroxine (SYNTHROID) 50 MCG tablet Take  1 tablet  Daily  on an empty stomach with only water for 30 minutes & no  Antacid meds, Calcium or Magnesium for 4 hours & avoid Biotin 90  tablet 3   LORazepam (ATIVAN) 0.5 MG tablet lorazepam 0.5 mg tablet  TAKE 1 TABLET BY MOUTH EVERY 8 HOURS AS NEEDED FOR SLEEP (NAUSEA OR VOMITING).     Magnesium 250 MG TABS Take 1 tablet by mouth daily.     rosuvastatin (CRESTOR) 5 MG tablet TAKE 1/4 TABLET DAILY FOR CHOLESTEROL 23 tablet 1   traZODone (DESYREL) 150 MG tablet Take  1/3  to 1/2  to 2/3 tablet  1 hour  before Bedtime as needed for Sleep 30 tablet 0   vitamin C (ASCORBIC ACID) 500 MG tablet Take 500 mg by mouth daily.     No current facility-administered medications for this visit.    Allergies as of 10/06/2021 - Review Complete 10/06/2021  Allergen Reaction Noted   Alendronate Other (See Comments) 09/15/2013   Risedronate sodium Other (See Comments) 09/15/2013   Sulfa antibiotics Other (See Comments) 09/15/2013   Sulfasalazine Other (See Comments) 09/15/2013   Other Rash 03/16/2011   Tetracyclines & related Rash 03/16/2011    Vitals: BP (!) 145/79    Pulse 82    Ht 5\' 4"  (1.626 m)    Wt 116 lb 8 oz (52.8 kg)    LMP 09/14/2014    BMI 20.00 kg/m  Last Weight:  Wt Readings from Last 1 Encounters:  10/06/21 116 lb 8 oz (52.8 kg)   TFT:DDUK mass index is 20 kg/m.     Last Height:   Ht Readings from Last 1 Encounters:  10/06/21 5\' 4"  (1.626 m)    Physical exam:  General: The patient is awake, alert and appears not in acute distress. The patient is well groomed. She appears nervous.  Head: Normocephalic, atraumatic. Titubation noted. Neck is supple. Mallampati 3, uvula deviated to the right.  neck circumference:14. 75. Nasal airflow patent - just recovering form congestion. ,  Retrognathia is seen.  Cardiovascular:  Regular rate and rhythm, without  murmurs or carotid bruit, and without distended neck veins. Respiratory: Lungs are clear to auscultation. Skin:  Without evidence of edema, or rash Trunk: BMI is 20 kg/m2. The patient's posture is erect - very erect.   Neurologic exam :The patient is awake and alert,  oriented to place and time.   Memory subjective  described as intact.  Attention span & concentration ability appears normal.  Speech is fluent,  without  dysarthria, mild dysphonia , no aphasia.  Mood and affect are defensive, slightly aggressive.   Cranial nerves: Pupils are equal and briskly reactive to light. Funduscopic exam deferred. appears to have dry eyes, lower eye lids reddened . Shannon Parsons Extraocular movements  in vertical and horizontal planes intact and without nystagmus. Hearing to finger rub intact. Facial sensation intact to fine touch. Facial motor strength is symmetric with a left sided mild ptosis.   Her tongue and uvula are without tremor- tongue is  midline. Uvula is slightly deviated to the right.  Shoulder shrug affected by cervical dystonia.  Motor exam:  Normal tone, muscle bulk and symmetric strength in all extremities. Deep tendon reflexes: in the  upper and lower extremities are symmetric and intact. Babinski maneuver deferred. .  Assessment:    Mrs. Thane is a pleasant 81year-old Caucasian right-handed female patient with a history of non- parkinsonian tremor, a diagnosis of cervical dystonia, lymphoma in remission, lifelong asthmatic, but she developed bronchiectasis and restrictive airway disease to a much greater degree likely associated with chemotherapy the  treatment of lymphoma.  She has been diagnosed with a mild sleep apnea before the lymphoma and for years CPAP did her well but now she feels that CPAP actually contributes to recurrent airway infections.   We replaced her CPAP , OSA is well treated -and her main concern remains now is  insomnia, which also precedes the onset of CPAP care.  She reports new onset of headaches. This may coincide with trazodone prescription, I will have her see her ophthalmology to rule out glaucoma.   I will urge her to use trazodone nightly , 50-100 mg and at 10 PM. If she can sleep 4.5 hours with CPAP each night, I shall be happy !    Bella swift mask- her favorite so far. Will repeat HST and  encouraged her to use trazodone.    Plan:  Treatment plan and additional workup : HST per patient's request.  RV in 4-5 month with NP.      Larey Seat, MD 03/27/9538, 67:28 AM  Certified in Neurology by ABP[employer] Certified in [place] by [facility] [address], [place], [place] [address]

## 2021-10-14 ENCOUNTER — Telehealth: Payer: Self-pay

## 2021-10-14 NOTE — Telephone Encounter (Signed)
Called patient for hypertension review call. Unable to reach patient. VM box has not been set up yet.   Total time spent: 2 minutes Shannon Parsons, [facility]

## 2021-10-26 ENCOUNTER — Telehealth: Payer: Self-pay

## 2021-10-26 NOTE — Telephone Encounter (Signed)
Hypertension Review Call  Is the patient enrolled in RPM with BP Monitor?: No BP #1 reading (last): 145/79 on: 09/13/2021 BP #2 reading: 158/76 on: 08/20/2021 BP #3 reading: 159/70 on: 08/10/2021 Any of the last 3 BP > 140/90 mmHg?: Yes What recent interventions/DTPs have been made by any provider to improve the patient's conditions in the last 3 months?: 08/20/21-Dr. Melford Aase (PCP)-Pt. presented w/ episodes of diarrhea & constipation. No medication changes noted.  10/06/21-Dohmeier, Asencion Partridge, MD (Neurology)-Pt. presented for a RV on her apnea.  MODIFIED trazodone HCl 150 MG Take 1/3 to 1/2 to 2/3 tablet 1 hour before Bedtime for Sleep Take 1/3 to 1/2 to 2/3 tablet 1 hour before Bedtime as needed for Sleep STOPPED  Escitalopram Oxalate 10 MG Take 1 tablet Daily for Chronic Anxiety (Patient Preference) Any recent hospitalizations or ED visits since last visit with CPP?: No   Adherence rates for STAR metric medications: Rosuvastatin 5mg  / 90DS / 04/24/21 & 07/23/21  Adherence rates for medications indicated for disease state being reviewed: Rosuvastatin 5mg  / 90DS / 04/24/21 & 07/23/21  Does the patient have >5 day gap between last estimated fill dates for any of the above medications?: Yes Reasons for medication gaps: Unknown  Disease State Questions  Able to connect with the Patient?: Yes Is the patient monitoring his/her BP?: Yes How often are you checking your BP?: daily Home BP Reading #1 (most recent): 160/83 Home BP Reading #2: 152/86 Home BP Reading #3: 136/74 Is the patient having any low BP Readings <90/60?: No Is the patient having any BP readings above >180/100?: No Is the patient's average BP>140/90?: Yes What is your blood pressure goal?: <130/70 Educate patient to inform proper points on checking BP at home: (Multiselect): When taking resting blood pressure: sit quietly for 5 minutes, not within 30 min. of exercising, no talking., Sit with  feet flat on the floor, arm at heart level., Do not drink caffeine or smoke a cigarette at least 30 min. prior to checking., Make sure using the right size cuff, the length of the cuffs bladder should be at least equal to 75% of the circumference of the upper arm. What diet changes have you made to improve your Blood Pressure Control? (Multiselect): eating more home-cooked meals, eating more fruits and vegetables What exercise are you doing to improve your Blood Pressure Control? (Multiselect): walking Misc. Response/Information:: Pt. requested no prescriptions to be sent to her pharmacy w/o her knowledge/consent  Pt. stated she had a terrible headache and that her BP last night was 160/83. Pt. denied any chest pain, SOB, dizziness. Encouraged patient to limit salt intake and processed foods. Pt. verbalized agreement and understanding. Encouraged patient to continue monitoring BP and to call me if she needs anything. Pt. agreed.  Total time spent: [place], [facility]

## 2021-10-27 ENCOUNTER — Telehealth: Payer: Self-pay

## 2021-10-27 NOTE — Telephone Encounter (Signed)
LVM for pt to call me back to schedule sleep study  

## 2021-10-29 ENCOUNTER — Encounter: Payer: Self-pay | Admitting: Adult Health

## 2021-10-29 ENCOUNTER — Ambulatory Visit (INDEPENDENT_AMBULATORY_CARE_PROVIDER_SITE_OTHER): Payer: PPO | Admitting: Adult Health

## 2021-10-29 ENCOUNTER — Other Ambulatory Visit: Payer: Self-pay

## 2021-10-29 VITALS — BP 146/76 | HR 77 | Temp 97.7°F | Wt 116.2 lb

## 2021-10-29 DIAGNOSIS — R519 Headache, unspecified: Secondary | ICD-10-CM | POA: Diagnosis not present

## 2021-10-29 NOTE — Progress Notes (Signed)
Assessment and Plan:  Shannon Parsons was seen today for headache.  Diagnoses and all orders for this visit:  Frequent headaches New near daily, progressive with some mild unsteady gait perceived, blurry vision She is agreeable to schedule full eye exam to r/o glaucoma, papilledema with Dr. Satira Sark ASAP Could be sinusitis but somewhat atypical presentation, other concerning features of vision changes and perceived unsteady gait, atypical HA in elder, will proceed with STAT imaging  Pending CT and vision check plan steroid taper and abx if indicated by CT view of sinuses Will go to the ER if worsening headache, changes vision/speech, imbalance, weakness. -     CT HEAD WO CONTRAST (5MM); Future   Further disposition pending results of labs. Discussed med's effects and SE's.   Over 30 minutes of exam, counseling, chart review, and critical decision making was performed.   Future Appointments  Date Time Provider [place]  11/17/2021 11:00 AM Unk Pinto, MD [facility] None  01/14/2022 10:30 AM [facility] None  01/14/2022 10:45 AM Ennever, Rudell Cobb, MD [facility] None  03/02/2022  3:00 PM Newton Pigg, [facility] None  05/13/2022 10:00 AM Magda Bernheim, NP [facility] None  07/07/2022 11:00 AM Lomax, Amy, NP [facility] None    ------------------------------------------------------------------------------------------------------------------   HPI BP (!) 146/76    Pulse 77    Temp 97.7 F (36.5 C)    Wt 116 lb 3.2 oz (52.7 kg)    LMP 09/14/2014    SpO2 99%    BMI 19.95 kg/m  81 y.o.female with hx of labile htn, OSA on CPAP, mantle cell lymphoma, presents for evaluation of 2 months of near daily headache. Denies hx of similar.   She reports new intermittent headache, started as generalized frontal, then intermittently has had unilateral, L vs R, feels like pressure behind eye, "like my eye ball is swelling" with pressure extending down into cheek today. Currently hurting behind her  left eye, throbbing and aching, 7-8/10 currently, will last several hours. She reports tylenol was helping, was taking 325-650 mg and would resolve with rest after a hours but doesn't seem to be improving today.   She notes mild persistent blurry vision, had vision check by Dr. Satira Sark in Sept 2022 but hasn't been back since. She did see Dr. Brett Fairy for CPAP management 10/06/2021 who recommended vision check for glaucoma, patient wasn't aware but receptive to schedule ASAP.   Denies n/v, fever/chills, rash Has noted mild but persistently more unsteady on feet without falls,  Denies paresthesias, weakness  Hx of chronic maxillary sinusitis over 5 years ago She denies recent nasal congestion, mild intermittent rhinitis ongoing for many months, without other URI sx  Past Medical History:  Diagnosis Date   Adenomatous colon polyp 1994   Asthma    Cataract 2013   bilateral eyes   Diverticulosis    Family history of ischemic heart disease    Fibrocystic breast disease    GERD (gastroesophageal reflux disease)    Hiatal hernia    Hyperlipidemia    IBS (irritable bowel syndrome)    Internal hemorrhoids    Mantle cell lymphoma ([place]) 10/18/2014   Melanoma ([place])    Facial, pt denies on 01/16/18   OSA (obstructive sleep apnea)    Osteoarthritis    Osteoporosis    Prediabetes    Unspecified hypothyroidism    Vitamin D deficiency      Allergies  Allergen Reactions   Alendronate Other (See Comments)    "made me choke"  Risedronate Sodium Other (See Comments)    "made me choke"     Sulfa Antibiotics Other (See Comments)    Doesn't remember    Sulfasalazine Other (See Comments)    Doesn't remember    Other Rash   Tetracyclines & Related Rash    Current Outpatient Medications on File Prior to Visit  Medication Sig   acetaminophen (TYLENOL) 325 MG tablet Take 650 mg by mouth at bedtime as needed for mild pain.   acidophilus (RISAQUAD) CAPS capsule Take 1 capsule by mouth daily.    Cholecalciferol (VITAMIN D3) 50 MCG (2000 UT) capsule Take 4,000 Units by mouth daily.    diazepam (VALIUM) 5 MG tablet Take 5 mg by mouth 3 (three) times daily as needed for muscle spasms. (Patient not taking: Reported on 10/29/2021)   hyoscyamine (LEVSIN SL) 0.125 MG SL tablet Take    1 to 2 tablets     every 4 hours       as needed for Nausea, Cramping, Bloating or Diarrhea DIARRHEA   levothyroxine (SYNTHROID) 50 MCG tablet Take  1 tablet  Daily  on an empty stomach with only water for 30 minutes & no Antacid meds, Calcium or Magnesium for 4 hours & avoid Biotin   LORazepam (ATIVAN) 0.5 MG tablet lorazepam 0.5 mg tablet  TAKE 1 TABLET BY MOUTH EVERY 8 HOURS AS NEEDED FOR SLEEP (NAUSEA OR VOMITING). (Patient not taking: Reported on 10/29/2021)   Magnesium 250 MG TABS Take 1 tablet by mouth daily.   rosuvastatin (CRESTOR) 5 MG tablet TAKE 1/4 TABLET DAILY FOR CHOLESTEROL   traZODone (DESYREL) 150 MG tablet Take  1/3  to 1/2  to 2/3 tablet  1 hour  before Bedtime for Sleep   vitamin C (ASCORBIC ACID) 500 MG tablet Take 500 mg by mouth daily.   No current facility-administered medications on file prior to visit.   Allergies:  Allergies  Allergen Reactions   Alendronate Other (See Comments)    "made me choke"     Risedronate Sodium Other (See Comments)    "made me choke"     Sulfa Antibiotics Other (See Comments)    Doesn't remember    Sulfasalazine Other (See Comments)    Doesn't remember    Other Rash   Tetracyclines & Related Rash   Surgical History:  She  has a past surgical history that includes Appendectomy; Tonsillectomy; Bunionectomy; Knee arthroscopy; Umbilical hernia repair; Video bronchoscopy (Bilateral, 10/12/2017); Cataract extraction, bilateral; and IR REMOVAL TUN ACCESS W/ PORT W/O FL MOD SED (03/04/2020). Family History:  Herfamily history includes Emphysema in her father; Heart attack (age of onset: 67) in her father; Heart disease in her father; Heart disease (age of  onset: 67) in her brother; Hypertension in her mother; Rheum arthritis in her sister; Thyroid disease in her mother. Social History:   reports that she has never smoked. She has never used smokeless tobacco. She reports that she does not drink alcohol and does not use drugs.   ROS: all negative except above.   Physical Exam:  BP (!) 146/76    Pulse 77    Temp 97.7 F (36.5 C)    Wt 116 lb 3.2 oz (52.7 kg)    LMP 09/14/2014    SpO2 99%    BMI 19.95 kg/m   General Appearance: Well nourished, well dressed female elder, appears uncomfortable but in no apparent distress. Eyes: PERRLA, EOMs, conjunctiva no swelling or erythema, no iritis, globes non tender, symmetrical firmness Sinuses:  mild left generalized left frontal and maxillary tenderness ENT/Mouth: Ext aud canals clear, TMs without erythema, bulging. No erythema, swelling, or exudate on post pharynx.  Tonsils not swollen or erythematous. Hearing normal. No TMJ popping, clicking, very mildly tender bilaterally Neck: Supple, thyroid normal.  Respiratory: Respiratory effort normal, BS equal bilaterally without rales, rhonchi, wheezing or stridor.  Cardio: RRR with no MRGs. Brisk peripheral pulses without edema.  Abdomen: Soft, + BS.  Non tender, no guarding, rebound, hernias, masses. Lymphatics: Non tender without lymphadenopathy.  Musculoskeletal: Full ROM, 5/5 strength, slow gait Skin: Warm, dry without rashes, lesions, ecchymosis.  Neuro: Cranial nerves intact. Normal muscle tone, no cerebellar symptoms. Sensation intact. Normal finger to nose, rapid alternating, heel to shin, neg pronator drift, slow gait Psych: Awake and oriented X 3, normal affect, Insight and Judgment appropriate.     Izora Ribas, NP 11:58 AM [facility]

## 2021-10-30 ENCOUNTER — Emergency Department (HOSPITAL_COMMUNITY): Payer: PPO

## 2021-10-30 ENCOUNTER — Emergency Department (HOSPITAL_COMMUNITY)
Admission: EM | Admit: 2021-10-30 | Discharge: 2021-10-30 | Disposition: A | Discharge status: Home / Self Care | Payer: PPO | Attending: Emergency Medicine | Admitting: Emergency Medicine

## 2021-10-30 ENCOUNTER — Other Ambulatory Visit: Payer: Self-pay

## 2021-10-30 ENCOUNTER — Encounter (HOSPITAL_COMMUNITY): Payer: Self-pay

## 2021-10-30 DIAGNOSIS — R519 Headache, unspecified: Secondary | ICD-10-CM | POA: Insufficient documentation

## 2021-10-30 DIAGNOSIS — R1110 Vomiting, unspecified: Secondary | ICD-10-CM | POA: Diagnosis not present

## 2021-10-30 DIAGNOSIS — K529 Noninfective gastroenteritis and colitis, unspecified: Secondary | ICD-10-CM | POA: Diagnosis not present

## 2021-10-30 DIAGNOSIS — Z20822 Contact with and (suspected) exposure to covid-19: Secondary | ICD-10-CM | POA: Diagnosis not present

## 2021-10-30 LAB — LIPASE, BLOOD: Lipase: 40 U/L (ref 11–51)

## 2021-10-30 LAB — CBC WITH DIFFERENTIAL/PLATELET
Abs Immature Granulocytes: 0.03 10*3/uL (ref 0.00–0.07)
Basophils Absolute: 0 10*3/uL (ref 0.0–0.1)
Basophils Relative: 0 %
Eosinophils Absolute: 0 10*3/uL (ref 0.0–0.5)
Eosinophils Relative: 0 %
HCT: 45 % (ref 36.0–46.0)
Hemoglobin: 14.7 g/dL (ref 12.0–15.0)
Immature Granulocytes: 0 %
Lymphocytes Relative: 3 %
Lymphs Abs: 0.3 10*3/uL — ABNORMAL LOW (ref 0.7–4.0)
MCH: 29.5 pg (ref 26.0–34.0)
MCHC: 32.7 g/dL (ref 30.0–36.0)
MCV: 90.4 fL (ref 80.0–100.0)
Monocytes Absolute: 0.5 10*3/uL (ref 0.1–1.0)
Monocytes Relative: 5 %
Neutro Abs: 9 10*3/uL — ABNORMAL HIGH (ref 1.7–7.7)
Neutrophils Relative %: 92 %
Platelets: 200 10*3/uL (ref 150–400)
RBC: 4.98 MIL/uL (ref 3.87–5.11)
RDW: 13.8 % (ref 11.5–15.5)
WBC: 9.8 10*3/uL (ref 4.0–10.5)
nRBC: 0 % (ref 0.0–0.2)

## 2021-10-30 LAB — URINALYSIS, ROUTINE W REFLEX MICROSCOPIC
Bilirubin Urine: NEGATIVE
Glucose, UA: NEGATIVE mg/dL
Ketones, ur: 20 mg/dL — AB
Leukocytes,Ua: NEGATIVE
Nitrite: NEGATIVE
Protein, ur: 30 mg/dL — AB
Specific Gravity, Urine: 1.017 (ref 1.005–1.030)
pH: 5 (ref 5.0–8.0)

## 2021-10-30 LAB — COMPREHENSIVE METABOLIC PANEL
ALT: 19 U/L (ref 0–44)
AST: 32 U/L (ref 15–41)
Albumin: 4.1 g/dL (ref 3.5–5.0)
Alkaline Phosphatase: 47 U/L (ref 38–126)
Anion gap: 12 (ref 5–15)
BUN: 22 mg/dL (ref 8–23)
CO2: 24 mmol/L (ref 22–32)
Calcium: 9.3 mg/dL (ref 8.9–10.3)
Chloride: 101 mmol/L (ref 98–111)
Creatinine, Ser: 1.16 mg/dL — ABNORMAL HIGH (ref 0.44–1.00)
GFR, Estimated: 48 mL/min — ABNORMAL LOW (ref >60)
Glucose, Bld: 161 mg/dL — ABNORMAL HIGH (ref 70–99)
Potassium: 3.9 mmol/L (ref 3.5–5.1)
Sodium: 137 mmol/L (ref 135–145)
Total Bilirubin: 0.8 mg/dL (ref 0.3–1.2)
Total Protein: 6.1 g/dL — ABNORMAL LOW (ref 6.5–8.1)

## 2021-10-30 LAB — RESP PANEL BY RT-PCR (FLU A&B, COVID) ARPGX2
Influenza A by PCR: NEGATIVE
Influenza B by PCR: NEGATIVE
SARS Coronavirus 2 by RT PCR: NEGATIVE

## 2021-10-30 MED ORDER — SODIUM CHLORIDE 0.9 % IV BOLUS
1000.0000 mL | Freq: Once | INTRAVENOUS | Status: AC
  Administered 2021-10-30: 1000 mL via INTRAVENOUS

## 2021-10-30 MED ORDER — ONDANSETRON HCL 4 MG/2ML IJ SOLN
4.0000 mg | Freq: Once | INTRAMUSCULAR | Status: AC
  Administered 2021-10-30: 4 mg via INTRAVENOUS
  Filled 2021-10-30: qty 2

## 2021-10-30 MED ORDER — ONDANSETRON 4 MG PO TBDP: 4.0000 mg | ORAL_TABLET | Freq: Three times a day (TID) | ORAL | 0 refills | Status: DC | PRN

## 2021-10-30 NOTE — ED Provider Notes (Signed)
[facility] EMERGENCY DEPARTMENT Provider Note   CSN: [id number] Arrival date & time: 10/30/21  1303     History  Chief Complaint  Patient presents with   Headache    Shannon Parsons is a 81 y.o. female.  HPI 81 year old female presents with vomiting and diarrhea since yesterday, as well as headache for weeks.  The vomiting and diarrhea started yesterday and she has been vomiting numerous times but now cannot even get anything up.  She is also having numerous diarrheal bowel movements, though no blood.  No fevers during this time.  No abdominal pain that she feels like she might be a little sore.  She also notes so on and off headache for 3+ weeks.  It is happening more frequently and typically happens not every day.  Sometimes it will be 1 side versus the other but is typically unilateral headache.  Feels like there is pressure in her eye though changes to the other eye.  When the headache comes on she feels like there is a little bit of blurry vision but otherwise normal vision at rest.  No photophobia or double vision.  No neck pain or stiffness.  No weakness or numbness in her extremities.  She has been taking Tylenol which she is not sure if it is helping or not.  The headaches typically last about 7 hours at a time. Saw her PCP who scheduled her a CT scan but she came here instead because of the vomiting/diarrhea.  Home Medications Prior to Admission medications   Medication Sig Start Date End Date Taking? Authorizing Provider  ondansetron (ZOFRAN-ODT) 4 MG disintegrating tablet Take 1 tablet (4 mg total) by mouth every 8 (eight) hours as needed for nausea or vomiting. 10/30/21  Yes Sherwood Gambler, MD  acetaminophen (TYLENOL) 325 MG tablet Take 650 mg by mouth at bedtime as needed for mild pain.    [provider]  acidophilus (RISAQUAD) CAPS capsule Take 1 capsule by mouth daily.    [provider]  Cholecalciferol (VITAMIN D3) 50 MCG (2000 UT)  capsule Take 4,000 Units by mouth daily.     [provider]  diazepam (VALIUM) 5 MG tablet Take 5 mg by mouth 3 (three) times daily as needed for muscle spasms. Patient not taking: Reported on 10/29/2021    [provider]  hyoscyamine (LEVSIN SL) 0.125 MG SL tablet Take    1 to 2 tablets     every 4 hours       as needed for Nausea, Cramping, Bloating or Diarrhea DIARRHEA 06/18/20   Unk Pinto, MD  levothyroxine (SYNTHROID) 50 MCG tablet Take  1 tablet  Daily  on an empty stomach with only water for 30 minutes & no Antacid meds, Calcium or Magnesium for 4 hours & avoid Biotin 01/22/21   Unk Pinto, MD  LORazepam (ATIVAN) 0.5 MG tablet lorazepam 0.5 mg tablet  TAKE 1 TABLET BY MOUTH EVERY 8 HOURS AS NEEDED FOR SLEEP (NAUSEA OR VOMITING). Patient not taking: Reported on 10/29/2021    [provider]  Magnesium 250 MG TABS Take 1 tablet by mouth daily.    [provider]  rosuvastatin (CRESTOR) 5 MG tablet TAKE 1/4 TABLET DAILY FOR CHOLESTEROL 07/23/21   Magda Bernheim, NP  traZODone (DESYREL) 150 MG tablet Take  1/3  to 1/2  to 2/3 tablet  1 hour  before Bedtime for Sleep 10/06/21   Dohmeier, Asencion Partridge, MD  vitamin C (ASCORBIC ACID)  500 MG tablet Take 500 mg by mouth daily.    [provider]      Allergies    Alendronate, Risedronate sodium, Sulfa antibiotics, Sulfasalazine, Other, and Tetracyclines & related    Review of Systems   Review of Systems  Constitutional:  Negative for fever.  Eyes:  Negative for photophobia.  Respiratory:  Negative for cough and shortness of breath.   Cardiovascular:  Negative for chest pain.  Gastrointestinal:  Positive for diarrhea and vomiting. Negative for abdominal pain and blood in stool.  Musculoskeletal:  Negative for neck pain and neck stiffness.  Neurological:  Positive for headaches. Negative for weakness and numbness.  All other systems reviewed and are negative.  Physical Exam Updated Vital  Signs BP 118/69    Pulse 81    Temp 98.9 F (37.2 C) (Oral)    Resp 20    Ht 5\' 1"  (1.549 m)    Wt 52.6 kg    LMP 09/14/2014    SpO2 94%    BMI 21.92 kg/m  Physical Exam Vitals and nursing note reviewed.  Constitutional:      General: She is not in acute distress.    Appearance: She is well-developed. She is not ill-appearing or diaphoretic.  HENT:     Head: Normocephalic and atraumatic.  Eyes:     Extraocular Movements: Extraocular movements intact.     Pupils: Pupils are equal, round, and reactive to light.  Cardiovascular:     Rate and Rhythm: Normal rate and regular rhythm.     Heart sounds: Normal heart sounds.  Pulmonary:     Effort: Pulmonary effort is normal.     Breath sounds: Normal breath sounds.  Abdominal:     Palpations: Abdomen is soft.     Tenderness: There is no abdominal tenderness.  Musculoskeletal:     Cervical back: Normal range of motion and neck supple.  Skin:    General: Skin is warm and dry.  Neurological:     Mental Status: She is alert.     Comments: CN 3-12 grossly intact. 5/5 strength in all 4 extremities. Grossly normal sensation. Normal finger to nose.     ED Results / Procedures / Treatments   Labs (all labs ordered are listed, but only abnormal results are displayed) Labs Reviewed  CBC WITH DIFFERENTIAL/PLATELET - Abnormal; Notable for the following components:      Result Value   Neutro Abs 9.0 (*)    Lymphs Abs 0.3 (*)    All other components within normal limits  COMPREHENSIVE METABOLIC PANEL - Abnormal; Notable for the following components:   Glucose, Bld 161 (*)    Creatinine, Ser 1.16 (*)    Total Protein 6.1 (*)    GFR, Estimated 48 (*)    All other components within normal limits  URINALYSIS, ROUTINE W REFLEX MICROSCOPIC - Abnormal; Notable for the following components:   APPearance HAZY (*)    Hgb urine dipstick SMALL (*)    Ketones, ur 20 (*)    Protein, ur 30 (*)    Bacteria, UA RARE (*)    Non Squamous Epithelial 0-5  (*)    All other components within normal limits  RESP PANEL BY RT-PCR (FLU A&B, COVID) ARPGX2  LIPASE, BLOOD    EKG None  Radiology CT Head Wo Contrast  Result Date: 10/30/2021 CLINICAL DATA:  Headache, vomiting EXAM: CT HEAD WITHOUT CONTRAST TECHNIQUE: Contiguous axial images were obtained from the base of the skull through the vertex  without intravenous contrast. RADIATION DOSE REDUCTION: This exam was performed according to the departmental dose-optimization program which includes automated exposure control, adjustment of the mA and/or kV according to patient size and/or use of iterative reconstruction technique. COMPARISON:  MRI head 12/28/2017 FINDINGS: Brain: No acute intracranial hemorrhage, mass effect, or herniation. No extra-axial fluid collections. No evidence of acute territorial infarct. No hydrocephalus. Very subtle hypoattenuation in the midbrain likely corresponding to the signal abnormality seen on previous MRI. Mild-to-moderate cortical volume loss. Vascular: No hyperdense vessel or unexpected calcification. Skull: Normal. Negative for fracture or focal lesion. Sinuses/Orbits: No acute finding. Other: None. IMPRESSION: Chronic changes with no acute intracranial process identified. Electronically Signed   By: Ofilia Neas M.D.   On: 10/30/2021 14:41    Procedures Procedures    Medications Ordered in ED Medications  sodium chloride 0.9 % bolus 1,000 mL (0 mLs Intravenous Stopped 10/30/21 1755)  ondansetron (ZOFRAN) injection 4 mg (4 mg Intravenous Given 10/30/21 1644)    ED Course/ Medical Decision Making/ A&P                           Medical Decision Making Risk Prescription drug management.   Patient's vital signs are normal.  She has a benign neuro exam.  Unclear what is causing these waxing and waning headaches but its not continuous or in any obvious pattern.  Does not occur every day.  Right now she is feeling okay though she is having acute gastroenteritis  symptoms.  Her abdominal exam is benign and she is feeling better with fluids and Zofran.  No fevers or neck stiffness to suggest meningitis.  I have pretty low suspicion for encephalitis.  Her "abnormal vision" appears to be a mild blurriness when the pain occurs, but right now seems to be fine and I do not think an emergent MRI is warranted.  Thank she can follow-up with her PCP for further headache work-up as needed.  I do not think LP is needed.  Given her benign abdominal exam I do not think a CT is needed of her abdomen.  I have reviewed the head CT images and do not see an obvious mass or bleed.  Low suspicion for subarachnoid hemorrhage.  Labs have been reviewed and show normal WBC, slightly bumped creatinine from baseline, and otherwise benign electrolytes.  Chart review shows she saw her PCP yesterday who had wanted to get the head CT and an Optho referral.  Given the eye symptoms seem to go between her 2 eyes and are not consistent I doubt this is a primary eye pathology.  We will discharge home with return precautions and Zofran prescription.        Final Clinical Impression(s) / ED Diagnoses Final diagnoses:  Acute gastroenteritis  Nonintractable headache, unspecified chronicity pattern, unspecified headache type    Rx / DC Orders ED Discharge Orders          Ordered    ondansetron (ZOFRAN-ODT) 4 MG disintegrating tablet  Every 8 hours PRN        10/30/21 1814              Sherwood Gambler, MD 10/30/21 3299

## 2021-10-30 NOTE — Discharge Instructions (Addendum)
If you develop continued, recurrent, or worsening headache, fever, neck stiffness, vomiting, blurry or double vision, weakness or numbness in your arms or legs, trouble speaking, or any other new/concerning symptoms then return to the ER for evaluation.  

## 2021-10-30 NOTE — ED Provider Triage Note (Signed)
Emergency Medicine Provider Triage Evaluation Note  Shannon Parsons , a 81 y.o. female  was evaluated in triage.  Pt complains of headaches, nausea, vomiting, and diarrhea. She states that her headache has been ongoing for the past several weeks, and with associated blurred vision.  States that she saw her primary doctor who scheduled her for a CT scan of her head and told her to see an ophthalmologist for her vision changes.  She has not done either of these yet.  States that her headache is still present, no new neurodeficits today.  Came in today for acute onset nausea, vomiting, and diarrhea that started last night.  She denies any abdominal pain, fevers, or chills.   Review of Systems  Positive: Nausea, vomiting, diarrhea, headaches, blurred vision Negative: Fevers, chills  Physical Exam  BP (!) 145/86 (BP Location: Right Arm)    Pulse (!) 101    Temp 99.3 F (37.4 C) (Oral)    Resp 18    Ht 5\' 1"  (1.549 m)    Wt 52.6 kg    LMP 09/14/2014    SpO2 98%    BMI 21.92 kg/m  Gen:   Awake, no distress   Resp:  Normal effort  MSK:   Moves extremities without difficulty  Other:  Mild left-sided facial droop, no other neurologic deficits, alert and oriented  Medical Decision Making  Medically screening exam initiated at 1:55 PM.  Appropriate orders placed.  Shannon Parsons was informed that the remainder of the evaluation will be completed by another provider, this initial triage assessment does not replace that evaluation, and the importance of remaining in the ED until their evaluation is complete.  Last known normal was over a month.   Bud Face, PA-C 10/30/21 1359

## 2021-10-30 NOTE — ED Triage Notes (Signed)
Pt here POV d/t reaccurnet headaches X2 weeks. Pt seen by PCP . Told to have vision checked and schedule CT scan. Pt endorses vision changes with headaches. Slight left facial dropp. Pt had sudden onset of N/V/ and diarrhea last night. Pt states she has not been symptom free for a month. Speech clear. No other neuro deficits noted.

## 2021-11-11 ENCOUNTER — Ambulatory Visit (INDEPENDENT_AMBULATORY_CARE_PROVIDER_SITE_OTHER): Payer: PPO | Admitting: Neurology

## 2021-11-11 DIAGNOSIS — T451X5A Adverse effect of antineoplastic and immunosuppressive drugs, initial encounter: Secondary | ICD-10-CM

## 2021-11-11 DIAGNOSIS — J984 Other disorders of lung: Secondary | ICD-10-CM

## 2021-11-11 DIAGNOSIS — G4733 Obstructive sleep apnea (adult) (pediatric): Secondary | ICD-10-CM | POA: Diagnosis not present

## 2021-11-11 DIAGNOSIS — F5104 Psychophysiologic insomnia: Secondary | ICD-10-CM

## 2021-11-11 DIAGNOSIS — Z9989 Dependence on other enabling machines and devices: Secondary | ICD-10-CM

## 2021-11-11 DIAGNOSIS — R519 Headache, unspecified: Secondary | ICD-10-CM

## 2021-11-13 NOTE — Progress Notes (Signed)
? ? ? ? ? ? ?  ?  ?[facility] ?  ?HOME SLEEP TEST REPORT ( by Watch PAT)   ?STUDY DATE:   ?DOB:   ?MRN:  ?  ?ORDERING CLINICIAN:  ?REFERRING CLINICIAN:  ?  ?CLINICAL INFORMATION/HISTORY: Pt reports CPAP has been causing terrible headaches. Initially believed it was from taking Lexapro she weaned this past weekend and continues to have a HA. Has a HA now. Takes tylenol. Pressure around the face and nose. R eye feels swollen and pressure.  ?Shannon Parsons is a 81 y.o. female patient , and here for a RV  10-06-21 on her apnea; she underwent a sleep study-: she had been a cpap user for many years before we re-evaluated her. 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 Lavone Parsons as a pulmonologist.  She has a history of hypertension, GERD, prediabetes, diverticulosis, generalized anxiety, cholesterol and vitamin D and is being treated almost being treated for a mantle cell carcinoma is Dr. Burney Parsons.  The treatment has been complicated when she developed a deep venous thrombosis.   ?Last test was a HST on 02-18-2020: There was still a severe sleep apnea noted ( in comparison to PSG ) , AHI 40/h and REM sleep AHI of 36/h. Supine sleep is most prone to apnea. ?There was no prolonged hypoxemia.   ? ? ?  ?Epworth sleepiness score: 3/24. ?  ?BMI: 19.9 kg/m? ?  ?Neck Circumference: 15" ?  ?FINDINGS: ?  ?Sleep Summary: ?  ?Total Recording Time (hours, min):   Total recording time amounted to 9 hours 10 minutes of which the total sleep time was 8 hours 6 minutes with a REM sleep proportion of 16.6%.               ?                           ?  ?Respiratory Indices: ?  ?Calculated pAHI (per hour): The overall apnea hypopnea index was 18.4 which is a moderate degree of apnea with a REM AHI of 35/h and a non-REM AHI of 15.2/h.                                          ? Positional  apnea hypopnea index in supine sleep was 42.1 advised sleeping  on the right 5.7/h.  There is a clear supine dependency noted.   ?Snoring data show a mean volume of 41 dB and present for only 17% of total sleep time which is not severe.                                          ?  ?Oxygen Saturation Statistics: ? O2 Saturation Range (%):   Oxygen saturations varied between a nadir of 86% and a maximum of 99% with a mean saturation of 95% oxygen.                                  ?  ?O2 Saturation (minutes) <89%:     0.3 minutes    ?  ?Pulse Rate Statistics: ?  ?  Pulse Mean (bpm): 65 bpm             ?  ?Pulse Range: Between 50 and 96 bpm             ?  ?IMPRESSION:  This HST confirms the presence of overall mild to moderate sleep apnea which was clearly accentuated in rem sleep but very much dependent on position of sleep.  If the patient can avoid supine sleep her AHI would not require additional CPAP therapy. ?  ?RECOMMENDATION: I will ask Shannon Parsons to avoid sleeping supine, she can use a tennis ball, she can also use a Philips sleep balance device.  This will help her to be reminded to change her sleep position should she be on her back. ? ?  ?INTERPRETING PHYSICIAN: ? ? Larey Seat, MD  ? ?Medical Director of [facility] [employer].  ? ? ? ? ? ? ? ? ? ? ? ? ? ? ? ? ? ?

## 2021-11-16 ENCOUNTER — Telehealth: Payer: Self-pay | Admitting: Neurology

## 2021-11-16 NOTE — Telephone Encounter (Signed)
-----   Message from Larey Seat, MD sent at 11/16/2021  8:19 AM EST ----- ?IMPRESSION:  This HST confirms the presence of overall mild to moderate sleep apnea, which was clearly accentuated by REM sleep -but very much dependent on the position of sleep. No hypoxia was noted, snoring was mild.  ? If the patient can avoid supine sleep, her AHI would not require additional CPAP therapy. ?? ?RECOMMENDATION: I will ask Mrs. Bronk to avoid sleeping supine, she can use a tennis ball, she can also use a Philips sleep balance device.  This will help her to be reminded to change her sleep position should she be on her back. ?? ?

## 2021-11-16 NOTE — Telephone Encounter (Signed)
Called the pt and reviewed the sleep study with her. Advised that the data did reflect that when on her right side the sleep apnea was effectively treated. Informed the patient that Dr Brett Fairy recommends strongly encouraging her to stay on her side during sleep preferably right side since we saw the data. Pt would like to continue using her CPAP machine and will work on staying on her back. She states that the machine is doing better. She will keep scheduled follow up apt. Pt verbalized understanding. ? ?

## 2021-11-16 NOTE — Procedures (Signed)
[facility] ?  ?HOME SLEEP TEST REPORT ( by Watch PAT)   ?STUDY DATE:   ?DOB:   ?MRN:  ?  ?ORDERING CLINICIAN:  ?REFERRING CLINICIAN:  ?  ?CLINICAL INFORMATION/HISTORY: Pt reports CPAP has been causing terrible headaches. Initially believed it was from taking Lexapro she weaned this past weekend and continues to have a HA. Has a HA now. Takes tylenol. Pressure around the face and nose. R eye feels swollen and pressure.  ?Shannon Parsons is a 80 y.o. female patient , and here for a RV  10-06-21 on her apnea; she underwent a sleep study-: she had been a cpap user for many years before we re-evaluated her. 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 Lavone Neri as a pulmonologist.  She has a history of hypertension, GERD, prediabetes, diverticulosis, generalized anxiety, cholesterol and vitamin D and is being treated almost being treated for a mantle cell carcinoma is Dr. Burney Gauze.  The treatment has been complicated when she developed a deep venous thrombosis.   ?Last test was a HST on 02-18-2020: There was still a severe sleep apnea noted ( in comparison to PSG ) , AHI 40/h and REM sleep AHI of 36/h. Supine sleep is most prone to apnea. ?There was no prolonged hypoxemia.   ? ? ?  ?Epworth sleepiness score: 3/24. ?  ?BMI: 19.9 kg/m? ?  ?Neck Circumference: 15" ?  ?FINDINGS: ?  ?Sleep Summary: ?  ?Total Recording Time (hours, min):   Total recording time amounted to 9 hours 10 minutes of which the total sleep time was 8 hours 6 minutes with a REM sleep proportion of 16.6%.               ?                           ?  ?Respiratory Indices: ?  ?Calculated pAHI (per hour): The overall apnea hypopnea index was 18.4 which is a moderate degree of apnea with a REM AHI of 35/h and a non-REM AHI of 15.2/h.                                          ? Positional  apnea hypopnea index in supine sleep was 42.1 advised sleeping on the right 5.7/h.   There is a clear supine dependency noted.   ?Snoring data show a mean volume of 41 dB and present for only 17% of total sleep time which is not severe.                                          ?  ?Oxygen Saturation Statistics: ? O2 Saturation Range (%):   Oxygen saturations varied between a nadir of 86% and a maximum of 99% with a mean saturation of 95% oxygen.                                  ?  ?O2 Saturation (minutes) <89%:     0.3 minutes    ?  ?Pulse Rate Statistics: ?  ?Pulse Mean (bpm): 65 bpm             ?  ?  Pulse Range: Between 50 and 96 bpm             ?  ?IMPRESSION:  This HST confirms the presence of overall mild to moderate sleep apnea which was clearly accentuated in rem sleep but very much dependent on position of sleep.  If the patient can avoid supine sleep her AHI would not require additional CPAP therapy. ?  ?RECOMMENDATION: I will ask Mrs. Nigh to avoid sleeping supine, she can use a tennis ball, she can also use a Philips sleep balance device.  This will help her to be reminded to change her sleep position should she be on her back. ? ?  ?INTERPRETING PHYSICIAN: ? ? Larey Seat, MD  ? ?Medical Director of [facility] at [employer].  ? ? ? ? ? ? ? ? ? ? ? ? ? ? ? ? ? ?

## 2021-11-16 NOTE — Progress Notes (Signed)
IMPRESSION:  This HST confirms the presence of overall mild to moderate sleep apnea, which was clearly accentuated by REM sleep -but very much dependent on the position of sleep. No hypoxia was noted, snoring was mild.  ? If the patient can avoid supine sleep, her AHI would not require additional CPAP therapy. ?? ?RECOMMENDATION: I will ask Shannon Parsons to avoid sleeping supine, she can use a tennis ball, she can also use a Philips sleep balance device.  This will help her to be reminded to change her sleep position should she be on her back. ??

## 2021-11-17 ENCOUNTER — Ambulatory Visit (INDEPENDENT_AMBULATORY_CARE_PROVIDER_SITE_OTHER): Payer: PPO | Admitting: Internal Medicine

## 2021-11-17 DIAGNOSIS — Z0000 Encounter for general adult medical examination without abnormal findings: Secondary | ICD-10-CM

## 2021-11-17 NOTE — Progress Notes (Signed)
? ? ?C  A  N  C  E  L  L  E D   ? ?DAY    OF    APPOINTMENT  ? ? ? ? ? ? ? ? ? ? ? ? ? ? ? ? ? ? ? ? ? ? ? ? ? ? ? ? ? ? ? ? ? ? ? ? ? ? ? ? ? ? ? ? ? ? ? ? ? ? ? ? ? ? ? ? ? ? ? ? ? ? ? ? ? ? ? ? ? ? ? ? ? ? ? ? ? ? ? ? ? ? ? ? ? ? ? ? ? ? ? ? ? ? ? ? ? ? ? ? ? ? ? ? ? ? ? ? ? ? ? ? ? ? ? ? ? ? ? ? ? ? ? ? ? ? ? ? ? ? ? ? ? ? ? ? ? ? ? ? ? ? ? ? ? ? ? ? ? ? ? ? ? ? ? ? ? ? ? ? ? ? ? ? ? ? ? ? ? ? ? ? ? ? ? ? ? ? ? ? ? ? ? ? ? ? ? ? ? ? ? ? ? ? ? ? ? ? ? ? ? ? ? ? ? ? ? ? ? ? ? ? ? ? ? ? ? ? ? ? ? ? ? ? ? ? ? ? ? ? ? ? ? ? ? ? ? ? ? ? ? ? ? ? ? ? ? ? ? ? ? ? ? ? ? ? ? ? ? ? ? ? ? ? ? ? ? ? ? ? ? ? ? ? ? ? ? ? ? ? ? ? ? ? ? ? ? ? ? ? ? ? ? ? ? ? ? ? ? ? ? ? ? ? ? ? ? ? ? ? ? ? ? ? ? ? ? ? ? ? ? ? ? ? ? ? ? ? ? ? ? ? ? ? ? ? ? ? ? ? ? ? ? ? ? ? ? ? ? ? ? ? ? ? ? ? ? ? ? ? ? ? ? ? ? ? ? ? ? ? ? ? ? ? ?Future Appointments  ?Date Time Provider Department  ?11/17/2021         CPE 11:00 AM Unk Pinto, MD [facility]  ?01/14/2022 10:45 AM Volanda Napoleon, MD [facility]  ?03/02/2022  3:00 PM Newton Pigg, [facility]  ?05/13/2022       Wellness 10:00 AM Magda Bernheim, NP [facility]  ?07/07/2022 11:00 AM Debbora Presto, NP [facility]  ?11/23/2022        CPE 11:00 AM Unk Pinto, MD [facility]  ? ? ?    This very nice 81 y.o.  MWF  presents for a Screening /Preventative Visit & comprehensive evaluation and management of multiple medical co-morbidities.  Patient has been followed for HTN, HLD, Prediabetes, Hereditary tremor, Hypothyroidism, OSA off CPAP ( followed by Dr Brett Fairy)  and Vitamin D Deficiency.Patient  has Obstructive bronchiectasis followed by Dr Melvyn Novas .   In 2019,  PET scan revealed  Aortic Atherosclerosis. ? ? ?    Patient has been followed by Dr Lattie Haw for Mantle cell lymphoma  (Jan 2016)  - last tx Nov 2018 & last monthly IVIG in Apr 2019 and he continues active  surveillance.  ? ? ? ?     Patient has long hx/o labile HTN  followed expectantly.  Patient's BP has been controlled at home and patient denies  any cardiac symptoms as chest pain, palpitations, shortness of breath, dizziness or ankle swelling. Today's    ? ? ?    Patient's hyperlipidemia is controlled with diet and Rosuvastatin. Patient denies myalgias or other medication SE's. Last lipids were near goal : ? ?Lab Results  ?Component Value Date  ? CHOL 187 05/13/2021  ? HDL 67 05/13/2021  ? LDLCALC 102 (H) 05/13/2021  ? TRIG 90 05/13/2021  ? CHOLHDL 2.8 05/13/2021  ? ? ? ?    Patient has hx/o prediabetes (A1c 5.9%/2012) and patient denies reactive hypoglycemic symptoms, visual blurring, diabetic polys or paresthesias. Last A1c was at goal : ? ?Lab Results  ?Component Value Date  ? HGBA1C 5.5 05/13/2021  ? ? ?       Patient was dx'd Hypothyroid in Oct 2012 and initiated on thyroid replacement.  ? ? ?    Finally, patient has history of Vitamin D Deficiency and last Vitamin D was sl low (goal 70-100) : ? ?Lab Results  ?Component Value Date  ? VD25OH 54 05/13/2021  ? ? ? ?Current Outpatient Medications on File Prior to Visit  ?Medication Sig  ? acetaminophen (TYLENOL) 325 MG tablet Take 650 mg at bedtime as needed for mild pain.  ? acidophilus (RISAQUAD) CAPS capsule Take 1 capsule daily.  ? Cholecalciferol (VITAMIN D3) 50 MCG (2000 UT) capsule Take 4,000 Units daily.   ? hyoscyamine (LEVSIN SL) 0.125 MG SL tablet Take    1 to 2 tablets     every 4 hours       as needed   ? levothyroxine (SYNTHROID) 50 MCG tablet Take  1 tablet  Daily    ? Magnesium 250 MG TABS Take 1 tablet  daily.  ? ondansetron (ZOFRAN-ODT) 4 MG  Take 1 tablet  every 8  hours as needed for nausea   ? rosuvastatin (CRESTOR) 5 MG tablet TAKE 1/4 TABLET DAILY FOR CHOLESTEROL  ? traZODone (DESYREL) 150 MG tablet Take  1/3  to 1/2  to 2/3 tablet  1 hour  before Bedtime for Sleep  ? vitamin C (ASCORBIC ACID) 500 MG tablet Take daily.  ? ?No current facility-administered medications on file prior to visit.  ? ? ? ? ? ?Last Colon -  Last Colon - 02/02/2017 - Dr Fuller Plan - Recc 5 yr f/u due May 2023                                      ? ? ?2. Labile hypertension ? ?- EKG 12-Lead ?- Urinalysis, Routine w reflex microscopic ?- Microalbumin / creatinine urine ratio ?- CBC with Differential/Platelet ?- COMPLETE METABOLIC PANEL WITH GFR ?- Magnesium ?- TSH ? ?3. Hyperlipidemia, mixed ? ?- EKG 12-Lead ?- Lipid panel ?- TSH ? ?4. Abnormal glucose ? ?- EKG 12-Lead ?- Hemoglobin A1c ?- Insulin, random ? ?5. Vitamin D deficiency ? ?- VITAMIN D 25 Hydroxy  ? ?6. Hypothyroidism ? ?- TSH ? ?7. Atherosclerosis of aorta ([place]) by PET Scan 04/21/2018. ? ?- EKG 12-Lead ? ?8. Hereditary essential tremor ? ? ?9. Mantle cell lymphoma ([place]) ? ?- CBC with Differential/Platelet ? ?10. Screening for colorectal cancer ? ?- POC Hemoccult Bld/Stl  ? ?11. Screening for heart disease ? ?-  EKG 12-Lead ? ?12. FHx: heart disease ? ?- EKG 12-Lead ? ?13. Medication management ? ?- Urinalysis, Routine w reflex microscopic ?- Microalbumin / creatinine urine ratio ?- CBC with Differential/Platelet ?- COMPLETE METABOLIC PANEL WITH GFR ?- Magnesium ?- Lipid panel ?- TSH ?- Hemoglobin A1c ?- Insulin, random ?- VITAMIN D 25 Hydroxy ? ?

## 2021-11-22 ENCOUNTER — Encounter: Payer: Self-pay | Admitting: Internal Medicine

## 2021-11-22 NOTE — Progress Notes (Signed)
Annual Screening/Preventative Visit & Comprehensive Evaluation &  Examination  Future Appointments  Date Time Provider Department  11/23/2021 11:00 AM Unk Pinto, MD [facility]  01/14/2022 10:45 AM Volanda Napoleon, MD [facility]  03/02/2022  3:00 PM Newton Pigg, [facility]  05/13/2022 10:00 AM Magda Bernheim, NP [facility]  07/07/2022 11:00 AM Debbora Presto, NP [facility]  11/23/2022 11:00 AM Unk Pinto, MD [facility]        This very nice 81 y.o. MWF  presents for a Screening /Preventative Visit & comprehensive evaluation and management of multiple medical co-morbidities.  Patient has been followed for labile HTN, HLD, Hypothyroidism, Prediabetes,  Essential Hereditary Tremor ,OSA/CPAP  and Vitamin D Deficiency .   PET scan in 05/2018 showed Aortic Atherosclerosis.  Patient has OSA on CPAP followed by Dr Brett Fairy. Patient also has hx/o Bronchiectasis followed by Dr Melvyn Novas.       Patient has hx/o Mantle Cell Lymphoma (2016) being followed by Dr Marin Olp.         HTN predates years . Patient's BP has been controlled at home and patient denies any cardiac symptoms as chest pain, palpitations, shortness of breath, dizziness or ankle swelling. Today's BP was initially elevated & rechecked at goal - 136/74 .       Patient's hyperlipidemia is controlled with diet and Rosuvastatin. Patient denies myalgias or other medication SE's. Last lipids were near goal :  Lab Results  Component Value Date   CHOL 187 05/13/2021   HDL 67 05/13/2021   LDLCALC 102 (H) 05/13/2021   TRIG 90 05/13/2021   CHOLHDL 2.8 05/13/2021         Patient has hx/o prediabetes (A1c 5.9% /2012) and patient denies reactive hypoglycemic symptoms, visual blurring, diabetic polys or paresthesias. Last A1c was normal & at goal :  Lab Results  Component Value Date   HGBA1C 5.5 05/13/2021        In 2012 , patient was started on replacement meds for her Hypothyroidism.        Finally, patient has history of Vitamin D  Deficiency ("35" / 2016) and last Vitamin D was slightly low  (goal 70-100) :  Lab Results  Component Value Date   VD25OH 54 05/13/2021     Current Outpatient Medications on File Prior to Visit  Medication Sig   acetaminophen (TYLENOL) 325 MG tablet Take 650 mg  at bedtime as needed for mild pain.   acidophilus (RISAQUAD) CAPS capsule Take 1 capsule daily.   VITAMIN D 2000 UT) capsule Take 4,000 Units  daily.    hyoscyamine SL) 0.125 MG S Take 1 to 2 tablets every 4 hours    levothyroxine 50 MCG tablet Take  1 tablet  Daily     Magnesium 250 MG TABS Take 1 tablet daily.   ondansetron -ODT) 4 MG  Take 1 tablet every 8  hours as needed    rosuvastatin  5 MG tablet TAKE 1/4 TABLET DAILY   traZODone 150 MG tablet Take  1/3  to 1/2 - 2/3 tablet  1 hr  before Bedtime for Sleep   vitamin C 500 MG tablet Take 5 daily.     Allergies  Allergen Reactions   Alendronate Other (See Comments)    "made me choke"   Risedronate Sodium Other (See Comments)    "made me choke"    Sulfa Antibiotics Other (See Comments)    Doesn't remember    Sulfasalazine Other (See Comments)    Doesn't remember  Other Rash   Tetracyclines & Related Rash     Past Medical History:  Diagnosis Date   Adenomatous colon polyp 1994   Asthma    Cataract 2013   bilateral eyes   Diverticulosis    Family history of ischemic heart disease    Fibrocystic breast disease    GERD (gastroesophageal reflux disease)    Hiatal hernia    Hyperlipidemia    IBS (irritable bowel syndrome)    Internal hemorrhoids    Mantle cell lymphoma ([place]) 10/18/2014   Melanoma ([place])    Facial, pt denies on 01/16/18   OSA (obstructive sleep apnea)    Osteoarthritis    Osteoporosis    Prediabetes    hypothyroidism    Vitamin D deficiency      Health Maintenance  Topic Date Due   Zoster Vaccines- Shingrix (1 of 2) Never done   COVID-19 Vaccine (4 - Booster for Pfizer series) 10/01/2020   TETANUS/TDAP  05/13/2022 (Originally  08/17/1960)   Pneumonia Vaccine 76+ Years old  Completed   DEXA SCAN  Completed   Hepatitis C Screening  Completed   HPV VACCINES  Aged Out   INFLUENZA VACCINE  Discontinued     Immunization History  Administered Date(s) Administered   PFIZER SARS-COV-2 Vacc 10/29/2019, 11/19/2019, 08/06/2020   Pneumococcal - 13 03/18/2016   Pneumococcal - 23 06/18/2013   Zoster, Live 04/17/2013    Last Colon - Last Colon - 02/02/2017 - Dr Fuller Plan - Recc 5 yr f/u due May 2023   Last MGM - 01/02/2021   Past Surgical History:  Procedure Laterality Date   APPENDECTOMY     BUNIONECTOMY     CATARACT EXTRACTION, BILATERAL     IR REMOVAL TUN ACCESS W/ PORT W/O FL MOD SED  03/04/2020   KNEE ARTHROSCOPY     TONSILLECTOMY     UMBILICAL HERNIA REPAIR     VIDEO BRONCHOSCOPY Bilateral 10/12/2017   VIDEO BRONCHOSCOPY WITHOUT FLUORO;  Tanda Rockers, [facility]     Family History  Problem Relation Age of Onset   Emphysema Father    Heart disease Father    Heart attack Father 30       Died   Heart disease Brother 41   Rheum arthritis Sister    Hypertension Mother    Thyroid disease Mother    Colon cancer Neg Hx    Stomach cancer Neg Hx      Social History   Tobacco Use   Smoking status: Never   Smokeless tobacco: Never   Tobacco comments:    Never Used Tobacco  Vaping Use   Vaping Use: Never used  Substance Use Topics   Alcohol use: No    Alcohol/week: 0.0 standard drinks   Drug use: No      ROS Constitutional: Denies fever, chills, weight loss/gain, headaches, insomnia,  night sweats, and change in appetite. Does c/o fatigue. Eyes: Denies redness, blurred vision, diplopia, discharge, itchy, watery eyes.  ENT: Denies discharge, congestion, post nasal drip, epistaxis, sore throat, earache, hearing loss, dental pain, Tinnitus, Vertigo, Sinus pain, snoring.  Cardio: Denies chest pain, palpitations, irregular heartbeat, syncope, dyspnea, diaphoresis, orthopnea, PND, claudication,  edema Respiratory: denies cough, dyspnea, DOE, pleurisy, hoarseness, laryngitis, wheezing.  Gastrointestinal: Denies dysphagia, heartburn, reflux, water brash, pain, cramps, nausea, vomiting, bloating, diarrhea, constipation, hematemesis, melena, hematochezia, jaundice, hemorrhoids Genitourinary: Denies dysuria, frequency, urgency, nocturia, hesitancy, discharge, hematuria, flank pain Breast: Breast lumps, nipple discharge, bleeding.  Musculoskeletal: Denies arthralgia, myalgia, stiffness, Jt.  Swelling, pain, limp, and strain/sprain. Denies falls. Skin: Denies puritis, rash, hives, warts, acne, eczema, changing in skin lesion Neuro: No weakness, tremor, incoordination, spasms, paresthesia, pain Psychiatric: Denies confusion, memory loss, sensory loss. Denies Depression. Endocrine: Denies change in weight, skin, hair change, nocturia, and paresthesia, diabetic polys, visual blurring, hyper / hypo glycemic episodes.  Heme/Lymph: No excessive bleeding, bruising, enlarged lymph nodes.  Physical Exam  BP 136/74    Pulse (!) 54    Temp 97.9 F (36.6 C)    Resp 17    Ht '5\' 4"'$  (1.626 m)    Wt 113 lb (51.3 kg)    LMP 09/14/2014    SpO2 95%    BMI 19.40 kg/m   General Appearance: Well nourished, well groomed and in no apparent distress.  Eyes: PERRLA, EOMs, conjunctiva no swelling or erythema, normal fundi and vessels. Sinuses: No frontal/maxillary tenderness ENT/Mouth: EACs patent / TMs  nl. Nares clear without erythema, swelling, mucoid exudates. Oral hygiene is good. No erythema, swelling, or exudate. Tongue normal, non-obstructing. Tonsils not swollen or erythematous. Hearing normal.  Neck: Supple, thyroid not palpable. No bruits, nodes or JVD. Respiratory: Respiratory effort normal.  BS equal and clear bilateral without rales, rhonci, wheezing or stridor. Cardio: Heart sounds are normal with regular rate and rhythm and no murmurs, rubs or gallops. Peripheral pulses are normal and equal  bilaterally without edema. No aortic or femoral bruits. Chest: symmetric with normal excursions and percussion. Breasts: Symmetric, without lumps, nipple discharge, retractions, or fibrocystic changes.  Abdomen: Flat, soft with bowel sounds active. Nontender, no guarding, rebound, hernias, masses, or organomegaly.  Lymphatics: Non tender without lymphadenopathy.  Musculoskeletal: Full ROM all peripheral extremities, joint stability, 5/5 strength, and normal gait. Skin: Warm and dry without rashes, lesions, cyanosis, clubbing or  ecchymosis.  Neuro: Cranial nerves intact, reflexes equal bilaterally. Normal muscle tone, no cerebellar symptoms. Sensation intact. Fine high frequency action tremor of hands & titubation of head.  Pysch: Alert and oriented X 3, normal affect, Insight and Judgment appropriate.    1. Labile hypertension  - EKG 12-Lead - Urinalysis, Routine w reflex microscopic - Microalbumin / creatinine urine ratio - CBC with Differential/Platelet - COMPLETE METABOLIC PANEL WITH GFR - TSH  2. Hyperlipidemia, mixed  - EKG 12-Lead - Magnesium  3. Abnormal glucose  - EKG 12-Lead - Hemoglobin A1c - Insulin, random  4. Vitamin D deficiency   5. Hypothyroidism  - TSH  6. Atherosclerosis of aorta ([place]) by PET Scan 04/21/2018.  - EKG 12-Lead  7. Hereditary essential tremor   8. Mantle cell lymphoma([facility])   9. Screening for colorectal cancer  - POC Hemoccult Bld/Stl   10. Screening for heart disease  - EKG 12-Lead  11. FHx: heart disease  - EKG 12-Lead  12. OSA and COPD overlap syndrome ([facility])  - EKG 12-Lead  13. Medication managent   - Urinalysis, Routine w reflex microscopic - Microalbumin / creatinine urine ratio - RPR - CBC with Differential/Platelet - COMPLETE METABOLIC PANEL WITH GFR - Magnesium - Lipid panel - TSH - Hemoglobin A1c - Insulin, random - VITAMIN D 25 Hydroxy          Patient was counseled in prudent diet to  achieve/maintain BMI less than 25 for weight control, BP monitoring, regular exercise and medications. Discussed med's effects and SE's. Screening labs and tests as requested with regular follow-up as recommended. Over 40 minutes of exam, counseling, chart review and high complex critical decision making was performed.  Kirtland Bouchard, MD

## 2021-11-22 NOTE — Patient Instructions (Signed)
Due to recent changes in healthcare laws, you may see the results of your imaging and laboratory studies on MyChart before your provider has had a chance to review them.  We understand that in some cases there may be results that are confusing or concerning to you. Not all laboratory results come back in the same time frame and the provider may be waiting for multiple results in order to interpret others.  Please give us 48 hours in order for your provider to thoroughly review all the results before contacting the office for clarification of your results.  ° °+++++++++++++++++++++++++++++++ ° Vit D  & °Vit C 1,000 mg   °are recommended to help protect  °against the Covid-19 and other Corona viruses.  ° ° Also it's recommended  °to take  °Zinc 50 mg  °to help  °protect against the Covid-19   °and best place to get ° is also on [employer]  °and don't pay more than 6-8 cents /pill !  °================================ °Coronavirus (COVID-19) Are you at risk? ° °Are you at risk for the Coronavirus (COVID-19)? ° °To be considered HIGH RISK for Coronavirus (COVID-19), you have to meet the following criteria: ° °Traveled to [place], [place], [place], [place] or [place]; or in the [place] to [place], [place], [place]  °or [place]; and have fever, cough, and shortness of breath within the last 2 weeks of travel OR °Been in close contact with a person diagnosed with COVID-19 within the last 2 weeks and have  °fever, cough,and shortness of breath ° °IF YOU DO NOT MEET THESE CRITERIA, YOU ARE CONSIDERED LOW RISK FOR COVID-19. ° °What to do if you are HIGH RISK for COVID-19? ° °If you are having a medical emergency, call 911. °Seek medical care right away. Before you go to a doctor’s office, urgent care or emergency department, ° call ahead and tell them about your recent travel, contact with someone diagnosed with COVID-19  ° and your symptoms.  °You should receive instructions from your physician’s office regarding  next steps of care.  °When you arrive at healthcare provider, tell the healthcare staff immediately you have returned from  °visiting [place], [place], [place], [place] or [place]; or traveled in the [place] to [place], [place],  °[place] or [place] in the last two weeks or you have been in close contact with a person diagnosed with  °COVID-19 in the last 2 weeks.   °Tell the health care staff about your symptoms: fever, cough and shortness of breath. °After you have been seen by a medical provider, you will be either: °Tested for (COVID-19) and discharged home on quarantine except to seek medical care if  °symptoms worsen, and asked to  °Stay home and avoid contact with others until you get your results (4-5 days)  °Avoid travel on public transportation if possible (such as bus, train, or airplane) or °Sent to the Emergency Department by EMS for evaluation, COVID-19 testing  and  °possible admission depending on your condition and test results. ° °What to do if you are LOW RISK for COVID-19? ° °Reduce your risk of any infection by using the same precautions used for avoiding the common cold or flu:  °Wash your hands often with soap and warm water for at least 20 seconds.  If soap and water are not readily available,  °use an alcohol-based hand sanitizer with at least 60% alcohol.  °If coughing or sneezing, cover your mouth and nose by coughing   or sneezing into the elbow areas of your shirt or coat, ° into a tissue or into your sleeve (not your hands). °Avoid shaking hands with others and consider head nods or verbal greetings only. °Avoid touching your eyes, nose, or mouth with unwashed hands.  °Avoid close contact with people who are sick. °Avoid places or events with large numbers of people in one location, like concerts or sporting events. °Carefully consider travel plans you have or are making. °If you are planning any travel outside or inside the [place], visit the CDC’s Travelers’ Health webpage for  the latest health notices. °If you have some symptoms but not all symptoms, continue to monitor at home and seek medical attention  °if your symptoms worsen. °If you are having a medical emergency, call 911. °>>>>>>>>>>>>>>>>>>>>>>>>>>>>>>>>>>>>>>>>>>>>>>>>>>> °We Do NOT Approve of [employer] °> > > > > > > > > > > > > > > > > > > > > > > > > > > > > > > > > > >  > >  ° ° °Preventive Care for Adults ° °A healthy lifestyle and preventive care can promote health and wellness. Preventive health guidelines for men include the following key practices: °A routine yearly physical is a good way to check with your health care provider about your health and preventative screening. It is a chance to share any concerns and updates on your health and to receive a thorough exam. °Visit your dentist for a routine exam and preventative care every 6 months. Brush your teeth twice a day and floss once a day. Good oral hygiene prevents tooth decay and gum disease. °The frequency of eye exams is based on your age, health, family medical history, use of contact lenses, and other factors. Follow your health care provider's recommendations for frequency of eye exams. °Eat a healthy diet. Foods such as vegetables, fruits, whole grains, low-fat dairy products, and lean protein foods contain the nutrients you need without too many calories. Decrease your intake of foods high in solid fats, added sugars, and salt. Eat the right amount of calories for you. Get information about a proper diet from your health care provider, if necessary. °Regular physical exercise is one of the most important things you can do for your health. Most adults should get at least 150 minutes of moderate-intensity exercise (any activity that increases your heart rate and causes you to sweat) each week. In addition, most adults need muscle-strengthening exercises on 2 or more days a week. °Maintain a healthy weight. The body mass index (BMI) is a screening  tool to identify possible weight problems. It provides an estimate of body fat based on height and weight. Your health care provider can find your BMI and can help you achieve or maintain a healthy weight. For adults 20 years and older: °A BMI below 18.5 is considered underweight. °A BMI of 18.5 to 24.9 is normal. °A BMI of 25 to 29.9 is considered overweight. °A BMI of 30 and above is considered obese. °Maintain normal blood lipids and cholesterol levels by exercising and minimizing your intake of saturated fat. Eat a balanced diet with plenty of fruit and vegetables. Blood tests for lipids and cholesterol should begin at age 20 and be repeated every 5 years. If your lipid or cholesterol levels are high, you are over 50, or you are at high risk for heart disease, you may need your cholesterol levels checked more frequently. Ongoing high lipid and cholesterol levels should be   treated with medicines if diet and exercise are not working. °If you smoke, find out from your health care provider how to quit. If you do not use tobacco, do not start. °Lung cancer screening is recommended for adults aged 55-80 years who are at high risk for developing lung cancer because of a history of smoking. A yearly low-dose CT scan of the lungs is recommended for people who have at least a 30-pack-year history of smoking and are a current smoker or have quit within the past 15 years. A pack year of smoking is smoking an average of 1 pack of cigarettes a day for 1 year (for example: 1 pack a day for 30 years or 2 packs a day for 15 years). Yearly screening should continue until the smoker has stopped smoking for at least 15 years. Yearly screening should be stopped for people who develop a health problem that would prevent them from having lung cancer treatment. °If you choose to drink alcohol, do not have more than 2 drinks per day. One drink is considered to be 12 ounces (355 mL) of beer, 5 ounces (148 mL) of wine, or 1.5 ounces (44  mL) of liquor. °Avoid use of street drugs. Do not share needles with anyone. Ask for help if you need support or instructions about stopping the use of drugs. °High blood pressure causes heart disease and increases the risk of stroke. Your blood pressure should be checked at least every 1-2 years. Ongoing high blood pressure should be treated with medicines, if weight loss and exercise are not effective. °If you are 45-79 years old, ask your health care provider if you should take aspirin to prevent heart disease. °Diabetes screening involves taking a blood sample to check your fasting blood sugar level. Testing should be considered at a younger age or be carried out more frequently if you are overweight and have at least 1 risk factor for diabetes. °Colorectal cancer can be detected and often prevented. Most routine colorectal cancer screening begins at the age of 50 and continues through age 75. However, your health care provider may recommend screening at an earlier age if you have risk factors for colon cancer. On a yearly basis, your health care provider may provide home test kits to check for hidden blood in the stool. Use of a small camera at the end of a tube to directly examine the colon (sigmoidoscopy or colonoscopy) can detect the earliest forms of colorectal cancer. Talk to your health care provider about this at age 50, when routine screening begins. Direct exam of the colon should be repeated every 5-10 years through age 75, unless early forms of precancerous polyps or small growths are found. °Hepatitis C blood testing is recommended for all people born from 1945 through 1965 and any individual with known risks for hepatitis C. °Screening for abdominal aortic aneurysm (AAA)  by ultrasound is recommended for people who have history of high blood pressure or who are current or former smokers. °Healthy men should  receive prostate-specific antigen (PSA) blood tests as part of routine cancer screening.  Talk with your health care provider about prostate cancer screening. °Testicular cancer screening is  recommended for adult males. Screening includes self-exam, a health care provider exam, and other screening tests. Consult with your health care provider about any symptoms you have or any concerns you have about testicular cancer. °Use sunscreen. Apply sunscreen liberally and repeatedly throughout the day. You should seek shade when your shadow is shorter than   you. Protect yourself by wearing long sleeves, pants, a wide-brimmed hat, and sunglasses year round, whenever you are outdoors. °Once a month, do a whole-body skin exam, using a mirror to look at the skin on your back. Tell your health care provider about new moles, moles that have irregular borders, moles that are larger than a pencil eraser, or moles that have changed in shape or color. °Stay current with required vaccines (immunizations). °Influenza vaccine. All adults should be immunized every year. °Tetanus, diphtheria, and acellular pertussis (Td, Tdap) vaccine. An adult who has not previously received Tdap or who does not know his vaccine status should receive 1 dose of Tdap. This initial dose should be followed by tetanus and diphtheria toxoids (Td) booster doses every 10 years. Adults with an unknown or incomplete history of completing a 3-dose immunization series with Td-containing vaccines should begin or complete a primary immunization series including a Tdap dose. Adults should receive a Td booster every 10 years. °Zoster vaccine. One dose is recommended for adults aged 60 years or older unless certain conditions are present. ° °PREVNAR - Pneumococcal 13-valent conjugate (PCV13) vaccine. When indicated, a person who is uncertain of his immunization history and has no record of immunization should receive the PCV13 vaccine. An adult aged 19 years or older who has certain medical conditions and has not been previously immunized should receive 1  dose of PCV13 vaccine. This PCV13 should be followed with a dose of pneumococcal polysaccharide (PPSV23) vaccine. The PPSV23 vaccine dose should be obtained 1 or more year(s)after the dose of PCV13 vaccine. An adult aged 19 years or older who has certain medical conditions and previously received 1 or more doses of PPSV23 vaccine should receive 1 dose of PCV13. The PCV13 vaccine dose should be obtained 1 or more years after the last PPSV23 vaccine dose. ° °PNEUMOVAX - Pneumococcal polysaccharide (PPSV23) vaccine. When PCV13 is also indicated, PCV13 should be obtained first. All adults aged 65 years and older should be immunized. An adult younger than age 65 years who has certain medical conditions should be immunized. Any person who resides in a nursing home or long-term care facility should be immunized. An adult smoker should be immunized. People with an immunocompromised condition and certain other conditions should receive both PCV13 and PPSV23 vaccines. People with human immunodeficiency virus (HIV) infection should be immunized as soon as possible after diagnosis. Immunization during chemotherapy or radiation therapy should be avoided. Routine use of PPSV23 vaccine is not recommended for American Indians, [place] Natives, or people younger than 65 years unless there are medical conditions that require PPSV23 vaccine. When indicated, people who have unknown immunization and have no record of immunization should receive PPSV23 vaccine. One-time revaccination 5 years after the first dose of PPSV23 is recommended for people aged 19-64 years who have chronic kidney failure, nephrotic syndrome, asplenia, or immunocompromised conditions. People who received 1-2 doses of PPSV23 before age 65 years should receive another dose of PPSV23 vaccine at age 65 years or later if at least 5 years have passed since the previous dose. Doses of PPSV23 are not needed for people immunized with PPSV23 at or after age 65  years. ° °Hepatitis A vaccine. Adults who wish to be protected from this disease, have certain high-risk conditions, work with hepatitis A-infected animals, work in hepatitis A research labs, or travel to or work in countries with a high rate of hepatitis A should be immunized. Adults who were previously unvaccinated and who anticipate close contact   with an international adoptee during the first 60 days after arrival in the [place] from a country with a high rate of hepatitis A should be immunized. ° °Hepatitis B vaccine. Adults should be immunized if they wish to be protected from this disease, have certain high-risk conditions, may be exposed to blood or other infectious body fluids, are household contacts or sex partners of hepatitis B positive people, are clients or workers in certain care facilities, or travel to or work in countries with a high rate of hepatitis B. ° °Preventive Service / Frequency ° °Ages 65 and over °Blood pressure check. °Lipid and cholesterol check. °Lung cancer screening. / Every year if you are aged 55-80 years and have a 30-pack-year history of smoking and currently smoke or have quit within the past 15 years. Yearly screening is stopped once you have quit smoking for at least 15 years or develop a health problem that would prevent you from having lung cancer treatment. °Fecal occult blood test (FOBT) of stool. You may not have to do this test if you get a colonoscopy every 10 years. °Flexible sigmoidoscopy** or colonoscopy.** / Every 5 years for a flexible sigmoidoscopy or every 10 years for a colonoscopy beginning at age 50 and continuing until age 75. °Hepatitis C blood test.** / For all people born from 1945 through 1965 and any individual with known risks for hepatitis C. °Abdominal aortic aneurysm (AAA) screening./ Screening current or former smokers or have Hypertension. °Skin self-exam. / Monthly. °Influenza vaccine. / Every year. °Tetanus, diphtheria, and acellular  pertussis (Tdap/Td) vaccine.** / 1 dose of Td every 10 years. ° °Zoster vaccine.** / 1 dose for adults aged 60 years or older. ° °       Pneumococcal 13-valent conjugate (PCV13) vaccine.  ° °Pneumococcal polysaccharide (PPSV23) vaccine.  ° °Hepatitis A vaccine.** / Consult your health care provider. °Hepatitis B vaccine.** / Consult your health care provider. °Screening for abdominal aortic aneurysm (AAA)  by ultrasound is recommended for people who have history of high blood pressure or who are current or former smokers. °++++++++++ °Recommend Adult Low Dose Aspirin or  °coated  Aspirin 81 mg daily  °To reduce risk of Colon Cancer 40 %, ° Skin Cancer 26 % ,  °Malignant Melanoma 46% ° and  °Pancreatic cancer 60% °++++++++++++++++++++++ °Vitamin D goal ° is between 70-100.  °Please make sure that you are taking your Vitamin D as directed.  °It is very important as a natural anti-inflammatory  °helping hair, skin, and nails, as well as reducing stroke and heart attack risk.  °It helps your bones and helps with mood. °It also decreases numerous cancer risks so please take it as directed.  °Low Vit D is associated with a 200-300% higher risk for CANCER  °and 200-300% higher risk for HEART   ATTACK  &  STROKE.   °...................................... °It is also associated with higher death rate at younger ages,  °autoimmune diseases like Rheumatoid arthritis, Lupus, Multiple Sclerosis.    °Also many other serious conditions, like depression, Alzheimer's °Dementia, infertility, muscle aches, fatigue, fibromyalgia - just to name a few. °++++++++++++++++++++++ °Recommend the book "The END of DIETING" by Dr Joel Fuhrman  °& the book "The END of DIABETES " by Dr Joel Fuhrman °At [employer] - get book & Audio CD's  °  Being diabetic has a  300% increased risk for heart attack, stroke, cancer, and alzheimer- type vascular dementia. It is very important that you work harder with diet by   avoiding all foods that are white. Avoid  white rice (brown & wild rice is OK), white potatoes (sweetpotatoes in moderation is OK), White bread or wheat bread or anything made out of white flour like bagels, donuts, rolls, buns, biscuits, cakes, pastries, cookies, pizza crust, and pasta (made from white flour & egg whites) - vegetarian pasta or spinach or wheat pasta is OK. Multigrain breads like Arnold's or [employer], or multigrain sandwich thins or flatbreads.  Diet, exercise and weight loss can reverse and cure diabetes in the early stages.  Diet, exercise and weight loss is very important in the control and prevention of complications of diabetes which affects every system in your body, ie. Brain - dementia/stroke, eyes - glaucoma/blindness, heart - heart attack/heart failure, kidneys - dialysis, stomach - gastric paralysis, intestines - malabsorption, nerves - severe painful neuritis, circulation - gangrene & loss of a leg(s), and finally cancer and Alzheimers. ° °  I recommend avoid fried & greasy foods,  sweets/candy, white rice (brown or wild rice or Quinoa is OK), white potatoes (sweet potatoes are OK) - anything made from white flour - bagels, doughnuts, rolls, buns, biscuits,white and wheat breads, pizza crust and traditional pasta made of white flour & egg white(vegetarian pasta or spinach or wheat pasta is OK).  Multi-grain bread is OK - like multi-grain flat bread or sandwich thins. Avoid alcohol in excess. Exercise is also important. ° °  Eat all the vegetables you want - avoid meat, especially red meat and dairy - especially cheese.  Cheese is the most concentrated form of trans-fats which is the worst thing to clog up our arteries. Veggie cheese is OK which can be found in the fresh produce section at [employer] or [employer] or [employer] ° °++++++++++++++++++++++ °DASH Eating Plan ° °DASH stands for "Dietary Approaches to Stop Hypertension."  ° °The DASH eating plan is a healthy eating plan that has been shown to reduce high  blood pressure (hypertension). Additional health benefits may include reducing the risk of type 2 diabetes mellitus, heart disease, and stroke. The DASH eating plan may also help with weight loss. °WHAT DO I NEED TO KNOW ABOUT THE DASH EATING PLAN? °For the DASH eating plan, you will follow these general guidelines: °Choose foods with a percent daily value for sodium of less than 5% (as listed on the food label). °Use salt-free seasonings or herbs instead of table salt or sea salt. °Check with your health care provider or pharmacist before using salt substitutes. °Eat lower-sodium products, often labeled as "lower sodium" or "no salt added." °Eat fresh foods. °Eat more vegetables, fruits, and low-fat dairy products. °Choose whole grains. Look for the word "whole" as the first word in the ingredient list. °Choose fish  °Limit sweets, desserts, sugars, and sugary drinks. °Choose heart-healthy fats. °Eat veggie cheese  °Eat more home-cooked food and less restaurant, buffet, and fast food. °Limit fried foods. °Cook foods using methods other than frying. °Limit canned vegetables. If you do use them, rinse them well to decrease the sodium. °When eating at a restaurant, ask that your food be prepared with less salt, or no salt if possible. °                  °   WHAT FOODS CAN I EAT? °Read Dr Joel Fuhrman's books on The End of Dieting & The End of Diabetes ° °Grains °Whole grain or whole wheat bread. Brown rice. Whole grain or whole wheat pasta. Quinoa, bulgur, and   whole grain cereals. Low-sodium cereals. Corn or whole wheat flour tortillas. Whole grain cornbread. Whole grain crackers. Low-sodium crackers. ° °Vegetables °Fresh or frozen vegetables (raw, steamed, roasted, or grilled). Low-sodium or reduced-sodium tomato and vegetable juices. Low-sodium or reduced-sodium tomato sauce and paste. Low-sodium or reduced-sodium canned vegetables.  ° °Fruits °All fresh, canned (in natural juice), or frozen fruits. ° °Protein  Products ° All fish and seafood.  Dried beans, peas, or lentils. Unsalted nuts and seeds. Unsalted canned beans. ° °Dairy °Low-fat dairy products, such as skim or 1% milk, 2% or reduced-fat cheeses, low-fat ricotta or cottage cheese, or plain low-fat yogurt. Low-sodium or reduced-sodium cheeses. ° °Fats and Oils °Tub margarines without trans fats. Light or reduced-fat mayonnaise and salad dressings (reduced sodium). Avocado. Safflower, olive, or canola oils. Natural peanut or almond butter. ° °Other °Unsalted popcorn and pretzels. °The items listed above may not be a complete list of recommended foods or beverages. Contact your dietitian for more options. ° °++++++++++++++++++++ ° °WHAT FOODS ARE NOT RECOMMENDED? °Grains/ White flour or wheat flour °White bread. White pasta. White rice. Refined cornbread. Bagels and croissants. Crackers that contain trans fat. ° °Vegetables ° °Creamed or fried vegetables. Vegetables in a . Regular canned vegetables. Regular canned tomato sauce and paste. Regular tomato and vegetable juices. ° °Fruits °Dried fruits. Canned fruit in light or heavy syrup. Fruit juice. ° °Meat and Other Protein Products °Meat in general - RED meat & White meat.  Fatty cuts of meat. Ribs, chicken wings, all processed meats as bacon, sausage, bologna, salami, fatback, hot dogs, bratwurst and packaged luncheon meats. ° °Dairy °Whole or 2% milk, cream, half-and-half, and cream cheese. Whole-fat or sweetened yogurt. Full-fat cheeses or blue cheese. Non-dairy creamers and whipped toppings. Processed cheese, cheese spreads, or cheese curds. ° °Condiments °Onion and garlic salt, seasoned salt, table salt, and sea salt. Canned and packaged gravies. Worcestershire sauce. Tartar sauce. Barbecue sauce. Teriyaki sauce. Soy sauce, including reduced sodium. Steak sauce. Fish sauce. Oyster sauce. Cocktail sauce. Horseradish. Ketchup and mustard. Meat flavorings and tenderizers. Bouillon cubes. Hot sauce. Tabasco sauce.  Marinades. Taco seasonings. Relishes. ° °Fats and Oils °Butter, stick margarine, lard, shortening and bacon fat. Coconut, palm kernel, or palm oils. Regular salad dressings. ° °Pickles and olives. Salted popcorn and pretzels. ° °The items listed above may not be a complete list of foods and beverages to avoid. ° ° °

## 2021-11-23 ENCOUNTER — Other Ambulatory Visit: Payer: Self-pay

## 2021-11-23 ENCOUNTER — Ambulatory Visit (INDEPENDENT_AMBULATORY_CARE_PROVIDER_SITE_OTHER): Payer: PPO | Admitting: Internal Medicine

## 2021-11-23 ENCOUNTER — Encounter: Payer: Self-pay | Admitting: Internal Medicine

## 2021-11-23 ENCOUNTER — Other Ambulatory Visit: Payer: Self-pay | Admitting: Internal Medicine

## 2021-11-23 VITALS — BP 136/74 | HR 54 | Temp 97.9°F | Resp 17 | Ht 64.0 in | Wt 113.0 lb

## 2021-11-23 DIAGNOSIS — G4733 Obstructive sleep apnea (adult) (pediatric): Secondary | ICD-10-CM

## 2021-11-23 DIAGNOSIS — Z0000 Encounter for general adult medical examination without abnormal findings: Secondary | ICD-10-CM | POA: Diagnosis not present

## 2021-11-23 DIAGNOSIS — Z136 Encounter for screening for cardiovascular disorders: Secondary | ICD-10-CM

## 2021-11-23 DIAGNOSIS — R0989 Other specified symptoms and signs involving the circulatory and respiratory systems: Secondary | ICD-10-CM

## 2021-11-23 DIAGNOSIS — Z1212 Encounter for screening for malignant neoplasm of rectum: Secondary | ICD-10-CM

## 2021-11-23 DIAGNOSIS — E039 Hypothyroidism, unspecified: Secondary | ICD-10-CM | POA: Diagnosis not present

## 2021-11-23 DIAGNOSIS — R7309 Other abnormal glucose: Secondary | ICD-10-CM

## 2021-11-23 DIAGNOSIS — G250 Essential tremor: Secondary | ICD-10-CM

## 2021-11-23 DIAGNOSIS — E782 Mixed hyperlipidemia: Secondary | ICD-10-CM

## 2021-11-23 DIAGNOSIS — Z79899 Other long term (current) drug therapy: Secondary | ICD-10-CM | POA: Diagnosis not present

## 2021-11-23 DIAGNOSIS — I700 Atherosclerosis of aorta: Secondary | ICD-10-CM

## 2021-11-23 DIAGNOSIS — Z1211 Encounter for screening for malignant neoplasm of colon: Secondary | ICD-10-CM

## 2021-11-23 DIAGNOSIS — C8310 Mantle cell lymphoma, unspecified site: Secondary | ICD-10-CM

## 2021-11-23 DIAGNOSIS — I10 Essential (primary) hypertension: Secondary | ICD-10-CM

## 2021-11-23 DIAGNOSIS — Z8249 Family history of ischemic heart disease and other diseases of the circulatory system: Secondary | ICD-10-CM

## 2021-11-23 DIAGNOSIS — Z1231 Encounter for screening mammogram for malignant neoplasm of breast: Secondary | ICD-10-CM

## 2021-11-23 DIAGNOSIS — E559 Vitamin D deficiency, unspecified: Secondary | ICD-10-CM | POA: Diagnosis not present

## 2021-11-23 MED ORDER — NADOLOL 20 MG PO TABS: ORAL_TABLET | ORAL | 3 refills | Status: DC

## 2021-11-24 LAB — CBC WITH DIFFERENTIAL/PLATELET
Absolute Monocytes: 568 cells/uL (ref 200–950)
Basophils Absolute: 60 cells/uL (ref 0–200)
Basophils Relative: 1.5 %
Eosinophils Absolute: 132 cells/uL (ref 15–500)
Eosinophils Relative: 3.3 %
HCT: 44.4 % (ref 35.0–45.0)
Hemoglobin: 14.6 g/dL (ref 11.7–15.5)
Lymphs Abs: 964 cells/uL (ref 850–3900)
MCH: 29.8 pg (ref 27.0–33.0)
MCHC: 32.9 g/dL (ref 32.0–36.0)
MCV: 90.6 fL (ref 80.0–100.0)
MPV: 10.2 fL (ref 7.5–12.5)
Monocytes Relative: 14.2 %
Neutro Abs: 2276 cells/uL (ref 1500–7800)
Neutrophils Relative %: 56.9 %
Platelets: 194 10*3/uL (ref 140–400)
RBC: 4.9 10*6/uL (ref 3.80–5.10)
RDW: 12.4 % (ref 11.0–15.0)
Total Lymphocyte: 24.1 %
WBC: 4 10*3/uL (ref 3.8–10.8)

## 2021-11-24 LAB — COMPLETE METABOLIC PANEL WITH GFR
AG Ratio: 2.4 (calc) (ref 1.0–2.5)
ALT: 13 U/L (ref 6–29)
AST: 25 U/L (ref 10–35)
Albumin: 4.4 g/dL (ref 3.6–5.1)
Alkaline phosphatase (APISO): 54 U/L (ref 37–153)
BUN: 15 mg/dL (ref 7–25)
CO2: 31 mmol/L (ref 20–32)
Calcium: 9.9 mg/dL (ref 8.6–10.4)
Chloride: 103 mmol/L (ref 98–110)
Creat: 0.89 mg/dL (ref 0.60–1.00)
Globulin: 1.8 g/dL (calc) — ABNORMAL LOW (ref 1.9–3.7)
Glucose, Bld: 69 mg/dL (ref 65–99)
Potassium: 4.6 mmol/L (ref 3.5–5.3)
Sodium: 141 mmol/L (ref 135–146)
Total Bilirubin: 0.6 mg/dL (ref 0.2–1.2)
Total Protein: 6.2 g/dL (ref 6.1–8.1)
eGFR: 66 mL/min/{1.73_m2} (ref >=60)

## 2021-11-24 LAB — URINALYSIS, ROUTINE W REFLEX MICROSCOPIC
Bilirubin Urine: NEGATIVE
Glucose, UA: NEGATIVE
Hgb urine dipstick: NEGATIVE
Ketones, ur: NEGATIVE
Leukocytes,Ua: NEGATIVE
Nitrite: NEGATIVE
Protein, ur: NEGATIVE
Specific Gravity, Urine: 1.012 (ref 1.001–1.035)
pH: 7 (ref 5.0–8.0)

## 2021-11-24 LAB — LIPID PANEL
Cholesterol: 175 mg/dL (ref <200)
HDL: 60 mg/dL (ref >=50)
LDL Cholesterol (Calc): 94 mg/dL (calc)
Non-HDL Cholesterol (Calc): 115 mg/dL (calc) (ref <130)
Total CHOL/HDL Ratio: 2.9 (calc) (ref <5.0)
Triglycerides: 109 mg/dL (ref <150)

## 2021-11-24 LAB — HEMOGLOBIN A1C
Hgb A1c MFr Bld: 5.5 % of total Hgb (ref <5.7)
Mean Plasma Glucose: 111 mg/dL
eAG (mmol/L): 6.2 mmol/L

## 2021-11-24 LAB — MICROALBUMIN / CREATININE URINE RATIO
Creatinine, Urine: 81 mg/dL (ref 20–275)
Microalb Creat Ratio: 33 mcg/mg creat — ABNORMAL HIGH (ref <30)
Microalb, Ur: 2.7 mg/dL

## 2021-11-24 LAB — INSULIN, RANDOM: Insulin: 4.2 u[IU]/mL

## 2021-11-24 LAB — TSH: TSH: 2.08 mIU/L (ref 0.40–4.50)

## 2021-11-24 LAB — VITAMIN D 25 HYDROXY (VIT D DEFICIENCY, FRACTURES): Vit D, 25-Hydroxy: 62 ng/mL (ref 30–100)

## 2021-11-24 LAB — MAGNESIUM: Magnesium: 2.1 mg/dL (ref 1.5–2.5)

## 2021-11-24 NOTE — Progress Notes (Signed)
<><><><><><><><><><><><><><><><><><><><><><><><><><><><><><><><><> ?<><><><><><><><><><><><><><><><><><><><><><><><><><><><><><><><><> ?-   Test results slightly outside the reference range are not unusual. ?If there is anything important, I will review this with you,  ?otherwise it is considered normal test values.  ?If you have further questions,  ?please do not hesitate to contact me at the office or via My Chart.  ?<><><><><><><><><><><><><><><><><><><><><><><><><><><><><><><><><> ?<><><><><><><><><><><><><><><><><><><><><><><><><><><><><><><><><> ? ?-  Total Chol = 175    Great  &   LDL Chol = 94 -  Excellent  ? ?- Very low risk for Heart Attack  / Stroke ?============================================================ ?============================================================ ? ?-  A1c - Normal - No Diabetes  - Great  ! ?<><><><><><><><><><><><><><><><><><><><><><><><><><><><><><><><><> ?<><><><><><><><><><><><><><><><><><><><><><><><><><><><><><><><><> ? ?- Vitamin D = 62 - Excellent -  Please keep dose same  ?<><><><><><><><><><><><><><><><><><><><><><><><><><><><><><><><><> ?<><><><><><><><><><><><><><><><><><><><><><><><><><><><><><><><><> ? ?- All Else - CBC - Kidneys - Electrolytes - Liver - Magnesium & Thyroid   ? ?- all  Normal / OK ?<><><><><><><><><><><><><><><><><><><><><><><><><><><><><><><><><> ?<><><><><><><><><><><><><><><><><><><><><><><><><><><><><><><><><> ? ? ? ? ? ? ? ? ? ? ? ? ? ? ? ? ? ? ? ? ? ? ? ? ?

## 2021-11-25 NOTE — Progress Notes (Signed)
Pt is aware of all lab results and recommendations, did not have any questions for the provider or nurse

## 2021-12-09 ENCOUNTER — Other Ambulatory Visit: Payer: Self-pay

## 2021-12-09 DIAGNOSIS — Z1211 Encounter for screening for malignant neoplasm of colon: Secondary | ICD-10-CM

## 2021-12-09 LAB — POC HEMOCCULT BLD/STL (HOME/3-CARD/SCREEN)
Card #2 Fecal Occult Blod, POC: NEGATIVE
Card #3 Fecal Occult Blood, POC: NEGATIVE
Fecal Occult Blood, POC: NEGATIVE

## 2021-12-10 DIAGNOSIS — Z1212 Encounter for screening for malignant neoplasm of rectum: Secondary | ICD-10-CM | POA: Diagnosis not present

## 2021-12-10 DIAGNOSIS — Z1211 Encounter for screening for malignant neoplasm of colon: Secondary | ICD-10-CM | POA: Diagnosis not present

## 2021-12-24 DIAGNOSIS — J471 Bronchiectasis with (acute) exacerbation: Secondary | ICD-10-CM | POA: Diagnosis not present

## 2021-12-24 DIAGNOSIS — J449 Chronic obstructive pulmonary disease, unspecified: Secondary | ICD-10-CM | POA: Diagnosis not present

## 2021-12-24 DIAGNOSIS — G4733 Obstructive sleep apnea (adult) (pediatric): Secondary | ICD-10-CM | POA: Diagnosis not present

## 2022-01-07 ENCOUNTER — Ambulatory Visit
Admission: RE | Admit: 2022-01-07 | Discharge: 2022-01-07 | Disposition: A | Discharge status: Home / Self Care | Payer: PPO | Source: Ambulatory Visit | Attending: Internal Medicine | Admitting: Internal Medicine

## 2022-01-07 DIAGNOSIS — Z1231 Encounter for screening mammogram for malignant neoplasm of breast: Secondary | ICD-10-CM | POA: Diagnosis not present

## 2022-01-14 ENCOUNTER — Other Ambulatory Visit: Payer: Self-pay

## 2022-01-14 ENCOUNTER — Inpatient Hospital Stay (HOSPITAL_BASED_OUTPATIENT_CLINIC_OR_DEPARTMENT_OTHER): Payer: PPO | Admitting: Hematology & Oncology

## 2022-01-14 ENCOUNTER — Encounter: Payer: Self-pay | Admitting: Hematology & Oncology

## 2022-01-14 ENCOUNTER — Inpatient Hospital Stay: Payer: PPO | Attending: Hematology & Oncology

## 2022-01-14 VITALS — BP 161/72 | HR 59 | Temp 98.0°F | Resp 18 | Wt 113.0 lb

## 2022-01-14 DIAGNOSIS — D801 Nonfamilial hypogammaglobulinemia: Secondary | ICD-10-CM | POA: Insufficient documentation

## 2022-01-14 DIAGNOSIS — J479 Bronchiectasis, uncomplicated: Secondary | ICD-10-CM | POA: Diagnosis not present

## 2022-01-14 DIAGNOSIS — C8310 Mantle cell lymphoma, unspecified site: Secondary | ICD-10-CM | POA: Insufficient documentation

## 2022-01-14 DIAGNOSIS — C8313 Mantle cell lymphoma, intra-abdominal lymph nodes: Secondary | ICD-10-CM | POA: Diagnosis not present

## 2022-01-14 DIAGNOSIS — F0390 Unspecified dementia without behavioral disturbance: Secondary | ICD-10-CM | POA: Diagnosis not present

## 2022-01-14 LAB — CMP (CANCER CENTER ONLY)
ALT: 17 U/L (ref 0–44)
AST: 28 U/L (ref 15–41)
Albumin: 4.6 g/dL (ref 3.5–5.0)
Alkaline Phosphatase: 58 U/L (ref 38–126)
Anion gap: 8 (ref 5–15)
BUN: 19 mg/dL (ref 8–23)
CO2: 30 mmol/L (ref 22–32)
Calcium: 10.1 mg/dL (ref 8.9–10.3)
Chloride: 100 mmol/L (ref 98–111)
Creatinine: 1.07 mg/dL — ABNORMAL HIGH (ref 0.44–1.00)
GFR, Estimated: 53 mL/min — ABNORMAL LOW (ref >60)
Glucose, Bld: 94 mg/dL (ref 70–99)
Potassium: 3.8 mmol/L (ref 3.5–5.1)
Sodium: 138 mmol/L (ref 135–145)
Total Bilirubin: 0.7 mg/dL (ref 0.3–1.2)
Total Protein: 6.7 g/dL (ref 6.5–8.1)

## 2022-01-14 LAB — CBC WITH DIFFERENTIAL (CANCER CENTER ONLY)
Abs Immature Granulocytes: 0.02 10*3/uL (ref 0.00–0.07)
Basophils Absolute: 0.1 10*3/uL (ref 0.0–0.1)
Basophils Relative: 1 %
Eosinophils Absolute: 0.2 10*3/uL (ref 0.0–0.5)
Eosinophils Relative: 3 %
HCT: 45.7 % (ref 36.0–46.0)
Hemoglobin: 14.8 g/dL (ref 12.0–15.0)
Immature Granulocytes: 0 %
Lymphocytes Relative: 23 %
Lymphs Abs: 1.4 10*3/uL (ref 0.7–4.0)
MCH: 29.4 pg (ref 26.0–34.0)
MCHC: 32.4 g/dL (ref 30.0–36.0)
MCV: 90.7 fL (ref 80.0–100.0)
Monocytes Absolute: 0.9 10*3/uL (ref 0.1–1.0)
Monocytes Relative: 16 %
Neutro Abs: 3.4 10*3/uL (ref 1.7–7.7)
Neutrophils Relative %: 57 %
Platelet Count: 210 10*3/uL (ref 150–400)
RBC: 5.04 MIL/uL (ref 3.87–5.11)
RDW: 13.3 % (ref 11.5–15.5)
WBC Count: 5.9 10*3/uL (ref 4.0–10.5)
nRBC: 0 % (ref 0.0–0.2)

## 2022-01-14 LAB — LACTATE DEHYDROGENASE: LDH: 187 U/L (ref 98–192)

## 2022-01-14 NOTE — Progress Notes (Signed)
?Hematology and Oncology Follow Up Visit ? ?Shannon Parsons ?[id number] ?June 01, 1941 81 y.o. ?01/14/2022 ? ? ?Principle Diagnosis:  ?Mantle cell lymphoma (MCIPI = 5) ?Thromboembolic event of the right leg ?Acquired Hypogammaglobulinemia ?Bronchiectasis ? ?Current Therapy:   ?Rituxan - maintanence - s/p cycle #10 - to finish in 07/2017 ?IVIG - q  month -restart on 10/27/2017; last dose 01/05/2018 ?    ?Interim History:   ? ?Shannon Parsons now comes in for follow-up.  We see her every 6 months.  Since we last saw her, she had the norovirus.  This is back in February.  She had it.  Her husband had it. ? ?He is still having problems with his dementia.  It is hard to say if this is ever going to get better.  It does not sound like he want to see anybody for this. ? ?She is trying to get out and exercise.  However, the pollen has been a problem for her. ? ?She has had no issues with fever.  She has had no cough.  She has had no rashes.  There is been no bleeding. ? ?Thankfully, she has avoided COVID. ? ?Overall, I would have said that her performance status is probably ECOG 1.   ? ?Medications:  ?Current Outpatient Medications:  ?  acetaminophen (TYLENOL) 325 MG tablet, Take 650 mg by mouth at bedtime as needed for mild pain., Disp: , Rfl:  ?  acidophilus (RISAQUAD) CAPS capsule, Take 1 capsule by mouth daily., Disp: , Rfl:  ?  Cholecalciferol (VITAMIN D3) 50 MCG (2000 UT) capsule, Take 4,000 Units by mouth daily. , Disp: , Rfl:  ?  hyoscyamine (LEVSIN SL) 0.125 MG SL tablet, Take    1 to 2 tablets     every 4 hours       as needed for Nausea, Cramping, Bloating or Diarrhea DIARRHEA, Disp: 90 tablet, Rfl: 0 ?  levothyroxine (SYNTHROID) 50 MCG tablet, Take  1 tablet  Daily  on an empty stomach with only water for 30 minutes & no Antacid meds, Calcium or Magnesium for 4 hours & avoid Biotin, Disp: 90 tablet, Rfl: 3 ?  Magnesium 250 MG TABS, Take 1 tablet by mouth daily., Disp: , Rfl:  ?  nadolol (CORGARD) 20 MG tablet, Take 1  tablet Daily for BP & Tremor                                            /                                      TAKE                BY             MOUTH, Disp: 90 tablet, Rfl: 3 ?  ondansetron (ZOFRAN-ODT) 4 MG disintegrating tablet, Take 1 tablet (4 mg total) by mouth every 8 (eight) hours as needed for nausea or vomiting., Disp: 10 tablet, Rfl: 0 ?  rosuvastatin (CRESTOR) 5 MG tablet, TAKE 1/4 TABLET DAILY FOR CHOLESTEROL, Disp: 23 tablet, Rfl: 1 ?  traZODone (DESYREL) 150 MG tablet, Take  1/3  to 1/2  to 2/3 tablet  1 hour  before Bedtime for Sleep, Disp: 30 tablet, Rfl: 5 ?  vitamin C (ASCORBIC ACID)  500 MG tablet, Take 500 mg by mouth daily., Disp: , Rfl:  ? ?Allergies:  ?Allergies  ?Allergen Reactions  ? Alendronate Other (See Comments)  ?  "made me choke" ? ?  ? Risedronate Sodium Other (See Comments)  ?  "made me choke"  ?  ? Sulfa Antibiotics Other (See Comments)  ?  Doesn't remember   ? Sulfasalazine Other (See Comments)  ?  Doesn't remember   ? Other Rash  ? Tetracyclines & Related Rash  ? ? ?Past Medical History, Surgical history, Social history, and Family History were reviewed and updated. ? ?Review of Systems: ?Review of Systems  ?Constitutional:  Positive for malaise/fatigue.  ?HENT:  Positive for congestion.   ?Eyes: Negative.   ?Respiratory:  Positive for shortness of breath and wheezing.   ?Cardiovascular: Negative.   ?Gastrointestinal: Negative.   ?Genitourinary: Negative.   ?Musculoskeletal: Negative.   ?Skin: Negative.   ?Neurological: Negative.   ?Endo/Heme/Allergies: Negative.   ?Psychiatric/Behavioral: Negative.    ? ?Physical Exam: ? weight is 113 lb (51.3 kg). Her oral temperature is 98 ?F (36.7 ?C). Her blood pressure is 161/72 (abnormal) and her pulse is 59 (abnormal). Her respiration is 18 and oxygen saturation is 98%.  ? ?Physical Exam ?Vitals reviewed.  ?HENT:  ?   Head: Normocephalic and atraumatic.  ?Eyes:  ?   Pupils: Pupils are equal, round, and reactive to light.  ?Cardiovascular:   ?   Rate and Rhythm: Normal rate and regular rhythm.  ?   Heart sounds: Normal heart sounds.  ?Pulmonary:  ?   Effort: Pulmonary effort is normal.  ?   Breath sounds: Normal breath sounds.  ?Abdominal:  ?   General: Bowel sounds are normal.  ?   Palpations: Abdomen is soft.  ?Musculoskeletal:     ?   General: No tenderness or deformity. Normal range of motion.  ?   Cervical back: Normal range of motion.  ?Lymphadenopathy:  ?   Cervical: No cervical adenopathy.  ?Skin: ?   General: Skin is warm and dry.  ?   Findings: No erythema or rash.  ?Neurological:  ?   Mental Status: She is alert and oriented to person, place, and time.  ?Psychiatric:     ?   Behavior: Behavior normal.     ?   Thought Content: Thought content normal.     ?   Judgment: Judgment normal.  ? ? ? ?Lab Results  ?Component Value Date  ? WBC 5.9 01/14/2022  ? HGB 14.8 01/14/2022  ? HCT 45.7 01/14/2022  ? MCV 90.7 01/14/2022  ? PLT 210 01/14/2022  ? ?  Chemistry   ?   ?Component Value Date/Time  ? NA 138 01/14/2022 1022  ? NA 141 07/13/2017 0743  ? NA 138 08/01/2015 1124  ? K 3.8 01/14/2022 1022  ? K 4.2 07/13/2017 0743  ? K 4.0 08/01/2015 1124  ? CL 100 01/14/2022 1022  ? CL 105 07/13/2017 0743  ? CO2 30 01/14/2022 1022  ? CO2 27 07/13/2017 0743  ? CO2 28 08/01/2015 1124  ? BUN 19 01/14/2022 1022  ? BUN 16 07/13/2017 0743  ? BUN 10.4 08/01/2015 1124  ? CREATININE 1.07 (H) 01/14/2022 1022  ? CREATININE 0.89 11/23/2021 1107  ? CREATININE 0.8 08/01/2015 1124  ?    ?Component Value Date/Time  ? CALCIUM 10.1 01/14/2022 1022  ? CALCIUM 9.5 07/13/2017 0743  ? CALCIUM 9.6 08/01/2015 1124  ? ALKPHOS 58 01/14/2022 1022  ? ALKPHOS 68  07/13/2017 0743  ? ALKPHOS 88 08/01/2015 1124  ? AST 28 01/14/2022 1022  ? AST 23 08/01/2015 1124  ? ALT 17 01/14/2022 1022  ? ALT 19 07/13/2017 0743  ? ALT 12 08/01/2015 1124  ? BILITOT 0.7 01/14/2022 1022  ? BILITOT <0.30 08/01/2015 1124  ?  ? ? ?Impression and Plan: ?Shannon Parsons is 81 year old white female. She has mantle cell  lymphoma. Again, by the [facility] she is an intermediate risk. ? ?She received 2 different courses of chemotherapy. She could not tolerate R-CHOP with Velcade substituting for vincristine. This however was incredibly helpful. We finished up with Rituxan/bendamustine. She completed this in August 2016. ? ?We then started maintenance Rituxan. She started this in November 2016.  She completed this in November 2018.   ? ?I am so happy that she still appears to be in remission.  I looked at her blood smear.  I did not see the on the blood smear that looked suspicious for any type of recurrence of the mantle cell lymphoma. ? ?Hopefully, she will be able to have a decent Summer.  I just feel bad that her husband is having his issues.  I know that she is sticking with him.  She is a good woman.  She is incredibly strong and strong-willed. ? ?We will plan to see her back in 6 months. ? ? ?Volanda Napoleon, MD ?5/4/202311:58 AM ? ?

## 2022-01-15 LAB — IGG, IGA, IGM
IgA: 9 mg/dL — ABNORMAL LOW (ref 64–422)
IgG (Immunoglobin G), Serum: 501 mg/dL — ABNORMAL LOW (ref 586–1602)
IgM (Immunoglobulin M), Srm: 23 mg/dL — ABNORMAL LOW (ref 26–217)

## 2022-01-29 ENCOUNTER — Other Ambulatory Visit: Payer: Self-pay | Admitting: Nurse Practitioner

## 2022-01-29 DIAGNOSIS — E782 Mixed hyperlipidemia: Secondary | ICD-10-CM

## 2022-02-02 NOTE — Progress Notes (Unsigned)
MEDICARE ANNUAL WELLNESS VISIT   Assessment:   Encounter for Medicare Annual Wellness Visit  Due Annually  Health Maintenance Reviewed  Healthy lifestyle reviewed and goals set   Labile hypertension - continue medications, DASH diet, exercise and monitor at home. Call if greater than 130/80.   Chronic obstructive airway disease with asthma ([place]) Continue follow up pulmonology  OSA (obstructive sleep apnea) Follow up pulmonology  Obstructive bronchiectasis ([place]) Continue follow up pulmonology  History of DVT Likely due to chemo/cancer, will monitor for signs, stay on bASA  Mantle cell lymphoma of intra-abdominal lymph nodes ([place]) Continue follow up oncology  Irritable bowel syndrome, unspecified type If not on benefiber then add it, decrease stress,  if any worsening symptoms, blood in stool, AB pain, etc call office  Diverticulosis of intestine without bleeding, unspecified intestinal tract location Increase fiber, if any worsening symptoms, blood in stool, AB pain, etc call office  Osteoporosis, unspecified osteoporosis type, unspecified pathological fracture presence Continue supplement  Osteoarthritis, unspecified osteoarthritis type, unspecified site monitor  Anxiety state continue medications, stress management techniques discussed, increase water, good sleep hygiene discussed, increase exercise, and increase veggies.   Mixed hyperlipidemia decrease fatty foods increase activity.   Normocytic anemia -     monitor  Acquired hypogammaglobulinemia ([place]) Continue follow up oncology  Atherosclerosis of aorta ([place]) by PET scan 04/21/18 Control blood pressure, cholesterol, glucose, increase exercise.  Appt with Dr. Percival Spanish 06/04/21  Essential tremor No worsening symptoms  OSA and COPD overlap syndrome ([place]) Continue follow up Dr. Brett Fairy  Over 30 minutes of counseling, chart review and critical decision making was performed Future Appointments  Date Time  Provider [place]  02/03/2022  2:00 PM Magda Bernheim, NP [facility] None  03/02/2022  3:00 PM Carleene Mains, RPH [facility] None  05/13/2022 10:00 AM Magda Bernheim, NP [facility] None  07/07/2022 11:00 AM Debbora Presto, NP [facility] None  07/20/2022 10:30 AM [facility] None  07/20/2022 10:45 AM Ennever, Rudell Cobb, MD [facility] None  11/23/2022 11:00 AM Unk Pinto, MD [facility] None    Plan:   During the course of the visit the patient was educated and counseled about appropriate screening and preventive services including:   Pneumococcal vaccine  Prevnar 13 Influenza vaccine Td vaccine Screening electrocardiogram Bone densitometry screening Colorectal cancer screening Diabetes screening Glaucoma screening Nutrition counseling  Advanced directives: requested   Subjective:  Shannon Parsons is a 81 y.o. female who presents for Medicare Annual Wellness Visit and follow up.    She still has constipation but states her stomach issues are better.  Her last colonoscopy was 2018, EGD 2015.  She is seeing Dr. Beacher May f, has OSA with CPAP  She will have nocturia 1-2. She gets very tired between 3-5 oclock.    Her blood pressure has been controlled at home, today their BP is   BP Readings from Last 3 Encounters:  01/14/22 (!) 161/72  11/23/21 136/74  10/30/21 118/69    She does workout. She denies chest pain, shortness of breath, dizziness.    BMI is There is no height or weight on file to calculate BMI., she has been working on diet and exercise. Wt Readings from Last 3 Encounters:  01/14/22 113 lb (51.3 kg)  11/23/21 113 lb (51.3 kg)  10/30/21 116 lb (52.6 kg)    She has a history of Mantle cell lymphoma, thromboembolic right leg due to cancer/chemo that is resolved, and bronchiectasis and acquired hypogammaglobulinemia, she continues to follow  with Dr. Marin Olp, and is doing very well. Followups have been good  She is on CPAP, has chronic cough/sinusitis  following with Dr. Melvyn Novas and Constance Holster and has obstructive bronchiectasis, she states it is getting better.   She is not on cholesterol medication and denies myalgias. Her cholesterol is not at goal. The cholesterol last visit was:   Lab Results  Component Value Date   CHOL 175 11/23/2021   HDL 60 11/23/2021   LDLCALC 94 11/23/2021   TRIG 109 11/23/2021   CHOLHDL 2.9 11/23/2021   Last A1C Lab Results  Component Value Date   HGBA1C 5.5 11/23/2021   Last GFR: Lab Results  Component Value Date   GFRNONAA 53 (L) 01/14/2022   Patient is on Vitamin D supplement.   Lab Results  Component Value Date   VD25OH 87 11/23/2021     She is on thyroid medication. Her medication is 1 pill of 50 mcg daily Lab Results  Component Value Date   TSH 2.08 11/23/2021    Medication Review:  Current Outpatient Medications (Endocrine & Metabolic):    levothyroxine (SYNTHROID) 50 MCG tablet, Take  1 tablet  Daily  on an empty stomach with only water for 30 minutes & no Antacid meds, Calcium or Magnesium for 4 hours & avoid Biotin  Current Outpatient Medications (Cardiovascular):    nadolol (CORGARD) 20 MG tablet, Take 1 tablet Daily for BP & Tremor                                            /                                      TAKE                BY             MOUTH   rosuvastatin (CRESTOR) 5 MG tablet, TAKE 1/4 TABLET DAILY FOR CHOLESTEROL   Current Outpatient Medications (Analgesics):    acetaminophen (TYLENOL) 325 MG tablet, Take 650 mg by mouth at bedtime as needed for mild pain.   Current Outpatient Medications (Other):    acidophilus (RISAQUAD) CAPS capsule, Take 1 capsule by mouth daily.   Cholecalciferol (VITAMIN D3) 50 MCG (2000 UT) capsule, Take 4,000 Units by mouth daily.    hyoscyamine (LEVSIN SL) 0.125 MG SL tablet, Take    1 to 2 tablets     every 4 hours       as needed for Nausea, Cramping, Bloating or Diarrhea DIARRHEA   Magnesium 250 MG TABS, Take 1 tablet by mouth daily.    ondansetron (ZOFRAN-ODT) 4 MG disintegrating tablet, Take 1 tablet (4 mg total) by mouth every 8 (eight) hours as needed for nausea or vomiting.   traZODone (DESYREL) 150 MG tablet, Take  1/3  to 1/2  to 2/3 tablet  1 hour  before Bedtime for Sleep   vitamin C (ASCORBIC ACID) 500 MG tablet, Take 500 mg by mouth daily.   Allergies  Allergen Reactions   Alendronate Other (See Comments)    "made me choke"     Risedronate Sodium Other (See Comments)    "made me choke"     Sulfa Antibiotics Other (See Comments)    Doesn't remember    Sulfasalazine Other (  See Comments)    Doesn't remember    Other Rash   Tetracyclines & Related Rash    Current Problems (verified) Patient Active Problem List   Diagnosis Date Noted   Psychophysiological insomnia 10/06/2021   Bronchiectasis with acute lower respiratory infection ([place]) 03/20/2020   OSA (obstructive sleep apnea) 03/20/2020   Moderate persistent asthma with status asthmaticus 03/20/2020   Chemotherapy-induced lung disease 03/20/2020   Hypothyroidism 10/04/2018   Abnormal glucose 10/04/2018   Labile hypertension 10/04/2018   Cervical dystonia 01/16/2018   Sinusitis, maxillary, chronic 10/19/2017   Atherosclerosis of aorta ([place]) by PET Scan 04/21/2018. 08/09/2017   Tremor 05/06/2016   Acquired hypogammaglobulinemia ([place]) 04/05/2016   Obstructive bronchiectasis ([place]) 03/18/2016   Body mass index (BMI) of 20.0-20.9 in adult 07/21/2015   History of DVT (deep vein thrombosis)    Normocytic anemia 01/04/2015   Mantle cell lymphoma ([place]) 10/18/2014   Medication management 07/15/2014   OSA and COPD overlap syndrome ([place]) 07/05/2014   Anxiety state 02/27/2014   Hyperlipidemia, mixed    Hypotension    GERD    Osteoporosis  (Refuses treatment)    Osteoarthritis    Vitamin D deficiency    IBS    Diverticulosis    History of colonic polyps 07/19/2011   Chronic obstructive airway disease with asthma ([place]) 03/16/2011    Screening  Tests Health Maintenance  Topic Date Due   Zoster Vaccines- Shingrix (1 of 2) Never done   COVID-19 Vaccine (4 - Booster for [place] series) 10/01/2020   COLONOSCOPY (Pts 45-23yr Insurance coverage will need to be confirmed)  02/02/2022   TETANUS/TDAP  05/13/2022 (Originally 08/17/1960)   Pneumonia Vaccine 81 Years old  Completed   DEXA SCAN  Completed   Hepatitis C Screening  Completed   HPV VACCINES  Aged Out   INFLUENZA VACCINE  Discontinued   Last colonoscopy: 2018 due 01/2022 EGD 2015 Last mammogram: 01/02/21 benign repeat 1 year Last pap smear/pelvic exam: 2010   DEXA: 12/13/19 osteoporosis repeat 2023 Bone marrow exam 2016 Ct sinus 10/2017 MRI brain w/wo 2019 Echo 02/2015 Holter 12/2017 Stress test 2015 Sleep study 11/30/2016  Prior vaccinations: TD or Tdap: declined  Influenza: declined Pneumococcal: 2014 Prevnar13: 2017 Shingles/Zostavax: 2014 Shingrix: will call insurance to see if covered Covid: [place]ob[place]2/2 and 1 booster, refuses any further Covid  Names of Other Physician/Practitioners you currently use: 1. [facility] here for primary care 2. [facility], eye doctor, last visit 06/14/2021 scheduled 3. [facility], 06/2021 scheduled Patient Care Team: MUnk Pinto MD as PCP - General (Internal Medicine) SLadene Artist MD as Consulting Physician (Gastroenterology) MSatira Sark MD as Consulting Physician (Cardiology) Clance, KArmando Reichert MD as Consulting Physician (Pulmonary Disease) EVolanda Napoleon MD as Consulting Physician (Oncology) [facility] Pharmacist (Pharmacist)  SURGICAL HISTORY She  has a past surgical history that includes Appendectomy; Tonsillectomy; Bunionectomy; Knee arthroscopy; Umbilical hernia repair; Video bronchoscopy (Bilateral, 10/12/2017); Cataract extraction, bilateral; and IR REMOVAL TUN ACCESS W/ PORT W/O FL MOD SED (03/04/2020). FAMILY HISTORY Her family history includes  Emphysema in her father; Heart attack (age of onset: 730 in her father; Heart disease in her father; Heart disease (age of onset: 61 in her brother; Hypertension in her mother; Rheum arthritis in her sister; Thyroid disease in her mother. SOCIAL HISTORY She  reports that she has never smoked. She has never used smokeless tobacco. She reports that she does not drink alcohol and does not use drugs.  MEDICARE WELLNESS OBJECTIVES: Physical activity:   Cardiac risk factors:   Depression/mood screen:      11/22/2021   10:19 PM  Depression screen PHQ 2/9  Decreased Interest 0  Down, Depressed, Hopeless 0  PHQ - 2 Score 0    ADLs:     11/22/2021   10:20 PM 05/13/2021   10:53 AM  In your present state of health, do you have any difficulty performing the following activities:  Hearing? 0 0  Vision? 0 0  Difficulty concentrating or making decisions? 0 0  Walking or climbing stairs? 0 0  Dressing or bathing? 0 0  Doing errands, shopping? 0 0     Cognitive Testing  Alert? Yes  Normal Appearance?Yes  Oriented to person? Yes  Place? Yes   Time? Yes  Recall of three objects?  Yes  Can perform simple calculations? Yes  Displays appropriate judgment?Yes  Can read the correct time from a watch face?Yes  EOL planning:    Review of Systems  Constitutional:  Negative for chills, fever, malaise/fatigue and weight loss.  HENT:  Negative for congestion, hearing loss, sore throat and tinnitus.   Eyes: Negative.  Negative for blurred vision and double vision.  Respiratory:  Negative for cough, shortness of breath and wheezing.   Cardiovascular:  Negative for chest pain, palpitations, orthopnea and leg swelling.  Gastrointestinal:  Negative for abdominal pain, blood in stool, constipation, diarrhea, heartburn, melena, nausea and vomiting.  Genitourinary:  Negative for hematuria.  Musculoskeletal:  Negative for falls, joint pain and myalgias.  Skin: Negative.  Negative for rash.  Neurological:   Negative for dizziness, tingling, tremors, sensory change, loss of consciousness and headaches.  Psychiatric/Behavioral:  Negative for depression, memory loss and suicidal ideas. The patient is not nervous/anxious and does not have insomnia.     Objective:     There were no vitals filed for this visit.  There is no height or weight on file to calculate BMI.  General Appearance: Frail appearing elder, in no apparent distress. Eyes: PERRLA, EOMs, conjunctiva no swelling or erythema Sinuses: No Frontal/maxillary tenderness ENT/Mouth: Ext aud canals clear, TMs without erythema, bulging. No erythema, swelling, or exudate on post pharynx.  Tonsils not swollen or erythematous. Hearing normal.  Neck: Supple, thyroid normal.  Respiratory: Respiratory effort normal, BS equal bilaterally without rales, rhonchi, wheezing or stridor.  Cardio: RRR with no MRGs. Portacath on left chest Abdomen: Soft, + BS.  + epigastric tenderness, no guarding, rebound, hernias, masses. Lymphatics: Non tender without lymphadenopathy.  Musculoskeletal: Full ROM, 5/5 strength, Normal gait Skin: Warm, dry without rashes, lesions, ecchymosis.  Neuro: Cranial nerves intact. No cerebellar symptoms. Essential tremor noticed of head Psych: Awake and oriented X 3, normal affect, Insight and Judgment appropriate.  Medicare Attestation I have personally reviewed: The patient's medical and social history Their use of alcohol, tobacco or illicit drugs Their current medications and supplements The patient's functional ability including ADLs,fall risks, home safety risks, cognitive, and hearing and visual impairment Diet and physical activities Evidence for depression or mood disorders  The patient's weight, height, BMI, and visual acuity have been recorded in the chart.  I have made referrals, counseling, and provided education to the patient based on review of the above and I have provided the patient with a written  personalized care plan for preventive services.     Magda Bernheim ANP-C  [facility]  02/02/2022

## 2022-02-03 ENCOUNTER — Ambulatory Visit (INDEPENDENT_AMBULATORY_CARE_PROVIDER_SITE_OTHER): Payer: PPO | Admitting: Nurse Practitioner

## 2022-02-03 ENCOUNTER — Encounter: Payer: Self-pay | Admitting: Nurse Practitioner

## 2022-02-03 VITALS — BP 138/80 | HR 47 | Temp 97.3°F | Wt 115.0 lb

## 2022-02-03 DIAGNOSIS — M810 Age-related osteoporosis without current pathological fracture: Secondary | ICD-10-CM

## 2022-02-03 DIAGNOSIS — J449 Chronic obstructive pulmonary disease, unspecified: Secondary | ICD-10-CM

## 2022-02-03 DIAGNOSIS — R6889 Other general symptoms and signs: Secondary | ICD-10-CM | POA: Diagnosis not present

## 2022-02-03 DIAGNOSIS — J479 Bronchiectasis, uncomplicated: Secondary | ICD-10-CM

## 2022-02-03 DIAGNOSIS — K589 Irritable bowel syndrome without diarrhea: Secondary | ICD-10-CM

## 2022-02-03 DIAGNOSIS — E782 Mixed hyperlipidemia: Secondary | ICD-10-CM | POA: Diagnosis not present

## 2022-02-03 DIAGNOSIS — Z79899 Other long term (current) drug therapy: Secondary | ICD-10-CM | POA: Diagnosis not present

## 2022-02-03 DIAGNOSIS — G250 Essential tremor: Secondary | ICD-10-CM

## 2022-02-03 DIAGNOSIS — D801 Nonfamilial hypogammaglobulinemia: Secondary | ICD-10-CM

## 2022-02-03 DIAGNOSIS — E559 Vitamin D deficiency, unspecified: Secondary | ICD-10-CM

## 2022-02-03 DIAGNOSIS — R5383 Other fatigue: Secondary | ICD-10-CM

## 2022-02-03 DIAGNOSIS — R7309 Other abnormal glucose: Secondary | ICD-10-CM

## 2022-02-03 DIAGNOSIS — R0989 Other specified symptoms and signs involving the circulatory and respiratory systems: Secondary | ICD-10-CM

## 2022-02-03 DIAGNOSIS — D649 Anemia, unspecified: Secondary | ICD-10-CM | POA: Diagnosis not present

## 2022-02-03 DIAGNOSIS — E039 Hypothyroidism, unspecified: Secondary | ICD-10-CM

## 2022-02-03 DIAGNOSIS — Z0001 Encounter for general adult medical examination with abnormal findings: Secondary | ICD-10-CM

## 2022-02-03 DIAGNOSIS — G4733 Obstructive sleep apnea (adult) (pediatric): Secondary | ICD-10-CM | POA: Diagnosis not present

## 2022-02-03 DIAGNOSIS — Z86718 Personal history of other venous thrombosis and embolism: Secondary | ICD-10-CM

## 2022-02-03 DIAGNOSIS — K5790 Diverticulosis of intestine, part unspecified, without perforation or abscess without bleeding: Secondary | ICD-10-CM

## 2022-02-03 DIAGNOSIS — F411 Generalized anxiety disorder: Secondary | ICD-10-CM

## 2022-02-03 DIAGNOSIS — M158 Other polyosteoarthritis: Secondary | ICD-10-CM

## 2022-02-03 DIAGNOSIS — C8313 Mantle cell lymphoma, intra-abdominal lymph nodes: Secondary | ICD-10-CM

## 2022-02-03 DIAGNOSIS — Z0000 Encounter for general adult medical examination without abnormal findings: Secondary | ICD-10-CM

## 2022-02-03 DIAGNOSIS — I700 Atherosclerosis of aorta: Secondary | ICD-10-CM

## 2022-02-03 NOTE — Patient Instructions (Addendum)
Cut Nadolol in half and check blood pressure daily and keep a log. Follow up in 2 weeks for evaluation of blood pressure/pulse and energy level  [facility] D[facility]A at [facility]- They will call you to schedule appointment

## 2022-02-04 LAB — CBC WITH DIFFERENTIAL/PLATELET
Absolute Monocytes: 689 cells/uL (ref 200–950)
Basophils Absolute: 82 cells/uL (ref 0–200)
Basophils Relative: 1.6 %
Eosinophils Absolute: 189 cells/uL (ref 15–500)
Eosinophils Relative: 3.7 %
HCT: 42.7 % (ref 35.0–45.0)
Hemoglobin: 14 g/dL (ref 11.7–15.5)
Lymphs Abs: 1239 cells/uL (ref 850–3900)
MCH: 29.5 pg (ref 27.0–33.0)
MCHC: 32.8 g/dL (ref 32.0–36.0)
MCV: 89.9 fL (ref 80.0–100.0)
MPV: 10.3 fL (ref 7.5–12.5)
Monocytes Relative: 13.5 %
Neutro Abs: 2902 cells/uL (ref 1500–7800)
Neutrophils Relative %: 56.9 %
Platelets: 219 10*3/uL (ref 140–400)
RBC: 4.75 10*6/uL (ref 3.80–5.10)
RDW: 12.5 % (ref 11.0–15.0)
Total Lymphocyte: 24.3 %
WBC: 5.1 10*3/uL (ref 3.8–10.8)

## 2022-02-04 LAB — COMPLETE METABOLIC PANEL WITH GFR
AG Ratio: 3.1 (calc) — ABNORMAL HIGH (ref 1.0–2.5)
ALT: 18 U/L (ref 6–29)
AST: 28 U/L (ref 10–35)
Albumin: 4.6 g/dL (ref 3.6–5.1)
Alkaline phosphatase (APISO): 53 U/L (ref 37–153)
BUN: 19 mg/dL (ref 7–25)
CO2: 26 mmol/L (ref 20–32)
Calcium: 10 mg/dL (ref 8.6–10.4)
Chloride: 101 mmol/L (ref 98–110)
Creat: 0.95 mg/dL (ref 0.60–1.00)
Globulin: 1.5 g/dL (calc) — ABNORMAL LOW (ref 1.9–3.7)
Glucose, Bld: 85 mg/dL (ref 65–99)
Potassium: 4.7 mmol/L (ref 3.5–5.3)
Sodium: 138 mmol/L (ref 135–146)
Total Bilirubin: 0.5 mg/dL (ref 0.2–1.2)
Total Protein: 6.1 g/dL (ref 6.1–8.1)
eGFR: 61 mL/min/{1.73_m2} (ref >=60)

## 2022-02-04 LAB — LIPID PANEL
Cholesterol: 182 mg/dL (ref <200)
HDL: 64 mg/dL (ref >=50)
LDL Cholesterol (Calc): 98 mg/dL (calc)
Non-HDL Cholesterol (Calc): 118 mg/dL (calc) (ref <130)
Total CHOL/HDL Ratio: 2.8 (calc) (ref <5.0)
Triglycerides: 103 mg/dL (ref <150)

## 2022-02-04 LAB — TSH: TSH: 1.5 mIU/L (ref 0.40–4.50)

## 2022-02-15 NOTE — Progress Notes (Unsigned)
Assessment and Plan:  There are no diagnoses linked to this encounter.    Further disposition pending results of labs. Discussed med's effects and SE's.   Over 30 minutes of exam, counseling, chart review, and critical decision making was performed.   Future Appointments  Date Time Provider [place]  02/16/2022  1:00 PM GI-[facility] DX DEXA 1 GI-BCGDG GI-BREAST CE  02/17/2022 11:30 AM Magda Bernheim, NP [facility] None  03/02/2022  3:00 PM Carleene Mains, RPH [facility] None  05/13/2022 10:00 AM Magda Bernheim, NP [facility] None  07/07/2022 11:00 AM Ubaldo Glassing, Amy, NP [facility] None  07/20/2022 10:30 AM [facility] LAB [facility] None  07/20/2022 10:45 AM Ennever, Rudell Cobb, MD [facility] None  11/23/2022 11:00 AM Unk Pinto, MD [facility] None    ------------------------------------------------------------------------------------------------------------------   HPI LMP 09/14/2014  81 y.o.female presents for  Past Medical History:  Diagnosis Date   Adenomatous colon polyp 1994   Asthma    Cataract 2013   bilateral eyes   Diverticulosis    Family history of ischemic heart disease    Fibrocystic breast disease    GERD (gastroesophageal reflux disease)    Hiatal hernia    Hyperlipidemia    IBS (irritable bowel syndrome)    Internal hemorrhoids    Mantle cell lymphoma ([place]) 10/18/2014   Melanoma ([place])    Facial, pt denies on 01/16/18   OSA (obstructive sleep apnea)    Osteoarthritis    Osteoporosis    Prediabetes    Unspecified hypothyroidism    Vitamin D deficiency      Allergies  Allergen Reactions   Alendronate Other (See Comments)    "made me choke"     Risedronate Sodium Other (See Comments)    "made me choke"     Sulfa Antibiotics Other (See Comments)    Doesn't remember    Sulfasalazine Other (See Comments)    Doesn't remember    Other Rash   Tetracyclines & Related Rash    Current Outpatient Medications on File Prior to Visit  Medication Sig   acetaminophen  (TYLENOL) 325 MG tablet Take 650 mg by mouth at bedtime as needed for mild pain.   acidophilus (RISAQUAD) CAPS capsule Take 1 capsule by mouth daily.   Cholecalciferol (VITAMIN D3) 50 MCG (2000 UT) capsule Take 4,000 Units by mouth daily.    hyoscyamine (LEVSIN SL) 0.125 MG SL tablet Take    1 to 2 tablets     every 4 hours       as needed for Nausea, Cramping, Bloating or Diarrhea DIARRHEA   levothyroxine (SYNTHROID) 50 MCG tablet Take  1 tablet  Daily  on an empty stomach with only water for 30 minutes & no Antacid meds, Calcium or Magnesium for 4 hours & avoid Biotin   Magnesium 250 MG TABS Take 1 tablet by mouth daily.   nadolol (CORGARD) 20 MG tablet Take 1 tablet Daily for BP & Tremor                                            /                                      TAKE  BY             MOUTH   ondansetron (ZOFRAN-ODT) 4 MG disintegrating tablet Take 1 tablet (4 mg total) by mouth every 8 (eight) hours as needed for nausea or vomiting.   rosuvastatin (CRESTOR) 5 MG tablet TAKE 1/4 TABLET DAILY FOR CHOLESTEROL   traZODone (DESYREL) 150 MG tablet Take  1/3  to 1/2  to 2/3 tablet  1 hour  before Bedtime for Sleep   vitamin C (ASCORBIC ACID) 500 MG tablet Take 500 mg by mouth daily.   No current facility-administered medications on file prior to visit.    ROS: all negative except above.   Physical Exam:  LMP 09/14/2014   General Appearance: Well nourished, in no apparent distress. Eyes: PERRLA, EOMs, conjunctiva no swelling or erythema Sinuses: No Frontal/maxillary tenderness ENT/Mouth: Ext aud canals clear, TMs without erythema, bulging. No erythema, swelling, or exudate on post pharynx.  Tonsils not swollen or erythematous. Hearing normal.  Neck: Supple, thyroid normal.  Respiratory: Respiratory effort normal, BS equal bilaterally without rales, rhonchi, wheezing or stridor.  Cardio: RRR with no MRGs. Brisk peripheral pulses without edema.  Abdomen: Soft, + BS.  Non  tender, no guarding, rebound, hernias, masses. Lymphatics: Non tender without lymphadenopathy.  Musculoskeletal: Full ROM, 5/5 strength, normal gait.  Skin: Warm, dry without rashes, lesions, ecchymosis.  Neuro: Cranial nerves intact. Normal muscle tone, no cerebellar symptoms. Sensation intact.  Psych: Awake and oriented X 3, normal affect, Insight and Judgment appropriate.     Magda Bernheim, NP 1:30 PM [facility]

## 2022-02-16 ENCOUNTER — Ambulatory Visit
Admission: RE | Admit: 2022-02-16 | Discharge: 2022-02-16 | Disposition: A | Discharge status: Home / Self Care | Payer: PPO | Source: Ambulatory Visit | Attending: Nurse Practitioner | Admitting: Nurse Practitioner

## 2022-02-16 DIAGNOSIS — M8588 Other specified disorders of bone density and structure, other site: Secondary | ICD-10-CM | POA: Diagnosis not present

## 2022-02-16 DIAGNOSIS — M810 Age-related osteoporosis without current pathological fracture: Secondary | ICD-10-CM | POA: Diagnosis not present

## 2022-02-16 DIAGNOSIS — Z780 Asymptomatic menopausal state: Secondary | ICD-10-CM | POA: Diagnosis not present

## 2022-02-17 ENCOUNTER — Ambulatory Visit (INDEPENDENT_AMBULATORY_CARE_PROVIDER_SITE_OTHER): Payer: PPO | Admitting: Nurse Practitioner

## 2022-02-17 ENCOUNTER — Encounter: Payer: Self-pay | Admitting: Nurse Practitioner

## 2022-02-17 VITALS — BP 138/78 | HR 61 | Temp 97.5°F | Wt 113.8 lb

## 2022-02-17 DIAGNOSIS — R0989 Other specified symptoms and signs involving the circulatory and respiratory systems: Secondary | ICD-10-CM

## 2022-02-17 DIAGNOSIS — Z1211 Encounter for screening for malignant neoplasm of colon: Secondary | ICD-10-CM

## 2022-02-17 NOTE — Patient Instructions (Signed)
Osteoporosis  Osteoporosis happens when the bones become thin and less dense than normal. Osteoporosis makes bones more brittle and fragile and more likely to break (fracture). Over time, osteoporosis can cause your bones to become so weak that they fracture after a minor fall. Bones in the hip, wrist, and spine are most likely to fracture due to osteoporosis. What are the causes? The exact cause of this condition is not known. What increases the risk? You are more likely to develop this condition if you: Have family members with this condition. Have poor nutrition. Use the following: Steroid medicines, such as prednisone. Anti-seizure medicines. Nicotine or tobacco, such as cigarettes, e-cigarettes, and chewing tobacco. Are female. Are age 79 or older. Are not physically active (are sedentary). Are of European or Asian descent. Have a small body frame. What are the signs or symptoms? A fracture might be the first sign of osteoporosis, especially if the fracture results from a fall or injury that usually would not cause a bone to break. Other signs and symptoms include: Pain in the neck or low back. Stooped posture. Loss of height. How is this diagnosed? This condition may be diagnosed based on: Your medical history. A physical exam. A bone mineral density test, also called a DXA or DEXA test (dual-energy X-ray absorptiometry test). This test uses X-rays to measure the amount of minerals in your bones. How is this treated? This condition may be treated by: Making lifestyle changes, such as: Including foods with more calcium and vitamin D in your diet. Doing weight-bearing and muscle-strengthening exercises. Stopping tobacco use. Limiting alcohol intake. Taking medicine to slow the process of bone loss or to increase bone density. Taking daily supplements of calcium and vitamin D. Taking hormone replacement medicines, such as estrogen for women and testosterone for  men. Monitoring your levels of calcium and vitamin D. The goal of treatment is to strengthen your bones and lower your risk for a fracture. Follow these instructions at home: Eating and drinking Include calcium and vitamin D in your diet. Calcium is important for bone health, and vitamin D helps your body absorb calcium. Good sources of calcium and vitamin D include: Certain fatty fish, such as salmon and tuna. Products that have calcium and vitamin D added to them (are fortified), such as fortified cereals. Egg yolks. Cheese. Liver.  Activity Do exercises as told by your health care provider. Ask your health care provider what exercises and activities are safe for you. You should do: Exercises that make you work against gravity (weight-bearing exercises), such as tai chi, yoga, or walking. Exercises to strengthen muscles, such as lifting weights. Lifestyle Do not drink alcohol if: Your health care provider tells you not to drink. You are pregnant, may be pregnant, or are planning to become pregnant. If you drink alcohol: Limit how much you use to: 0-1 drink a day for women. 0-2 drinks a day for men. Know how much alcohol is in your drink. In the [place] one drink equals one 12 oz bottle of beer (355 mL), one 5 oz glass of wine (148 mL), or one 1 oz glass of hard liquor (44 mL). Do not use any products that contain nicotine or tobacco, such as cigarettes, e-cigarettes, and chewing tobacco. If you need help quitting, ask your health care provider. Preventing falls Use devices to help you move around (mobility aids) as needed, such as canes, walkers, scooters, or crutches. Keep rooms well-lit and clutter-free. Remove tripping hazards from walkways, including cords and  throw rugs. Install grab bars in bathrooms and safety rails on stairs. Use rubber mats in the bathroom and other areas that are often wet or slippery. Wear closed-toe shoes that fit well and support your feet. Wear shoes  that have rubber soles or low heels. Review your medicines with your health care provider. Some medicines can cause dizziness or changes in blood pressure, which can increase your risk of falling. General instructions Take over-the-counter and prescription medicines only as told by your health care provider. Keep all follow-up visits. This is important. Contact a health care provider if: You have never been screened for osteoporosis and you are: A woman who is age 79 or older. A man who is age 48 or older. Get help right away if: You fall or injure yourself. Summary Osteoporosis is thinning and loss of density in your bones. This makes bones more brittle and fragile and more likely to break (fracture),even with minor falls. The goal of treatment is to strengthen your bones and lower your risk for a fracture. Include calcium and vitamin D in your diet. Calcium is important for bone health, and vitamin D helps your body absorb calcium. Talk with your health care provider about screening for osteoporosis if you are a woman who is age 79 or older, or a man who is age 79 or older. This information is not intended to replace advice given to you by your health care provider. Make sure you discuss any questions you have with your health care provider. Document Revised: 02/14/2020 Document Reviewed: 02/14/2020 Elsevier Patient Education  [place].

## 2022-02-19 ENCOUNTER — Encounter: Payer: Self-pay | Admitting: Gastroenterology

## 2022-02-23 DIAGNOSIS — Z1211 Encounter for screening for malignant neoplasm of colon: Secondary | ICD-10-CM | POA: Diagnosis not present

## 2022-03-01 ENCOUNTER — Telehealth: Payer: Self-pay

## 2022-03-01 NOTE — Telephone Encounter (Signed)
LM-03/01/22-Calling pt. To r/s CP f/u visit d/t CP schedule overbooked. Spoke to pt. And pt. Pt. Asked why she needed to see CP when she has her PCP handle her medications. Explained to pt. That our services includes personalized support from designated clinical staff supervised by pts.PCP including individualized plan of care and coordination, we have 24/7 contact phone numbers for assistance for urgent and routine care needs, and patient may stop CCM services at any time (effective at the end of the month) by phone call to the office staff. Pt. agreed to r/s visit. Pt. R/s to 9/12 at 1:00pm for a f/u phone visit. Informed pt. To call if any new health concerns arise in between now and next scheduled visit. Pt. Verbalized understanding and agreed.  Total time spent:11 min.

## 2022-03-02 ENCOUNTER — Telehealth: Payer: PPO | Admitting: Pharmacy Technician

## 2022-03-03 ENCOUNTER — Other Ambulatory Visit: Payer: Self-pay | Admitting: Nurse Practitioner

## 2022-03-03 DIAGNOSIS — R195 Other fecal abnormalities: Secondary | ICD-10-CM

## 2022-03-03 LAB — COLOGUARD: COLOGUARD: POSITIVE — AB

## 2022-03-08 ENCOUNTER — Other Ambulatory Visit: Payer: Self-pay | Admitting: Internal Medicine

## 2022-03-17 ENCOUNTER — Encounter: Payer: Self-pay | Admitting: Gastroenterology

## 2022-03-31 ENCOUNTER — Telehealth: Payer: Self-pay | Admitting: *Deleted

## 2022-03-31 NOTE — Telephone Encounter (Signed)
Pt. Called to schedule OV,message left with call back # to return call.

## 2022-03-31 NOTE — Telephone Encounter (Signed)
Office appt please

## 2022-03-31 NOTE — Telephone Encounter (Signed)
Pt.scheduled for colonoscopy on 05/18/22, you had in recall assessment no recall d/t age,a referral for procedure sent over d/t positive cologuard would you like a OV direct please advise?

## 2022-03-31 NOTE — Telephone Encounter (Signed)
Patients appointments for PV and colon were canceled and patient was scheduled for an OV with Dr. Fuller Plan on 9/8 at 9:30

## 2022-03-31 NOTE — Telephone Encounter (Signed)
Noted  

## 2022-04-21 DIAGNOSIS — B078 Other viral warts: Secondary | ICD-10-CM | POA: Diagnosis not present

## 2022-04-21 DIAGNOSIS — L2089 Other atopic dermatitis: Secondary | ICD-10-CM | POA: Diagnosis not present

## 2022-05-12 NOTE — Progress Notes (Addendum)
3 MONTH FOLLOW UP  Assessment:     Labile hypertension - continue DASH diet, exercise and monitor at home. Call if greater than 130/80.  - Nadolol was d/c'd last visit, BP controlled - CBC, CMP   Chronic obstructive airway disease with asthma ([place]) Continue follow up pulmonology  Hypothyroidism Please take your thyroid medication greater than 30 min before breakfast, separated by at least 4 hours  from antacids, calcium, iron, and multivitamins.  - TSH  Fatigue Practice relaxation techniques, good sleep hygiene -Iron/TIBC -Ferritin - Vit B12  Obstructive bronchiectasis ([place]) Continue follow up pulmonology   Mantle cell lymphoma of intra-abdominal lymph nodes ([place]) In remission, Continue follow up oncology   Anxiety state continue medications, stress management techniques discussed, increase water, good sleep hygiene discussed, increase exercise, and increase veggies.  - Given list of therapists to try to schedule appointment to discuss coping with husband dementia  Mixed hyperlipidemia decrease fatty foods increase activity.  - Lipid panel   Acquired hypogammaglobulinemia ([place]) Continue follow up oncology  Atherosclerosis of aorta ([place]) by PET scan 04/21/18 Control blood pressure, cholesterol, glucose, increase exercise.  Has been evaluated by Dr. Percival Spanish  Essential tremor No worsening symptoms  OSA and COPD overlap syndrome ([place]) Continue follow up Dr. Brett Fairy  Vitamin D deficiency Continue Vit D supplementation to maintain value in therapeutic level of 60-100  - Vit D  Medication Management Continued  Over 30 minutes of counseling, chart review and critical decision making was performed Future Appointments  Date Time Provider [place]  05/21/2022  9:30 AM Ladene Artist, MD [facility]  05/25/2022  1:00 PM Carleene Mains, RPH [facility] None  07/07/2022 11:00 AM Ubaldo Glassing, Amy, NP [facility]NA None  07/20/2022 10:30 AM [facility] LAB [facility]  None  07/20/2022 10:45 AM Ennever, Rudell Cobb, MD [facility] None  11/23/2022 11:00 AM Unk Pinto, MD [facility] None       Subjective:  Shannon Parsons is a 81 y.o. female who presents for Medicare Annual Wellness Visit and follow up.    She is having a lot more fatigue and low back pain and pain across her shoulders. She does have osteoporosis. Was unable to tolerate medication. She is taking Vit D. She is having a lot more stress with her husbands dementia- she has a lot of stress with increased demands placed on her by her husband.  She has been having increased bloating and constipation. Has appt with Dr. Fuller Plan next Friday.  Is taking Gas x which does help some. Also uses Levsin as needed.  Her last colonoscopy was 2018, EGD 2015. She does take a probiotic and magnesium  She is seeing Dr. Beacher May , has OSA with CPAP Had home sleep study 11/2021 which confirmed mild to moderate sleep apnea.  She will have nocturia 1-2. She gets very tired between 3-5 oclock.    She was having a lot of stress related to her husbands dementia.  Nadolol was discontinued at last visit. Fatigue did improve slightly.  Blood pressure has been controlled at home , today their BP is BP: 138/80 BP Readings from Last 3 Encounters:  05/13/22 138/80  02/17/22 138/78  02/03/22 138/80   She does workout. She denies chest pain, shortness of breath, dizziness.     BMI is Body mass index is 19.4 kg/m., she has been working on diet and exercise. Wt Readings from Last 3 Encounters:  05/13/22 113 lb (51.3 kg)  02/17/22 113 lb 12.8 oz (51.6 kg)  02/03/22 115  lb (52.2 kg)    She has a history of Mantle cell lymphoma, thromboembolic right leg due to cancer/chemo that is resolved, and bronchiectasis and acquired hypogammaglobulinemia, she continues to follow with Dr. Marin Olp, and is doing very well. Followups have been good. Last visit was 01/14/22.  She is on CPAP, has chronic cough/sinusitis following with Dr. Melvyn Novas  and Constance Holster and has obstructive bronchiectasis, she states it is getting better.   She is not on cholesterol medication and denies myalgias. Her cholesterol is not at goal. The cholesterol last visit was:   Lab Results  Component Value Date   CHOL 179 05/13/2022   HDL 66 05/13/2022   LDLCALC 95 05/13/2022   TRIG 89 05/13/2022   CHOLHDL 2.7 05/13/2022   Last A1C Lab Results  Component Value Date   HGBA1C 5.5 11/23/2021   Last GFR: Lab Results  Component Value Date   GFRNONAA 53 (L) 01/14/2022   Patient is on Vitamin D supplement.   Lab Results  Component Value Date   VD25OH 36 05/13/2022     She is on thyroid medication. Her medication is 1 pill of 50 mcg daily Lab Results  Component Value Date   TSH 1.50 02/03/2022    Medication Review:  Current Outpatient Medications (Endocrine & Metabolic):    levothyroxine (SYNTHROID) 50 MCG tablet, TAKE 1 TABLET DAILY ON AN EMPTY STOMACH WITH ONLY WATER FOR 30 MINUTES & NO ANTACID MEDS, CALCIUM OR MAGNESIUM FOR 4 HOURS & AVOID BIOTIN  Current Outpatient Medications (Cardiovascular):    rosuvastatin (CRESTOR) 5 MG tablet, TAKE 1/4 TABLET DAILY FOR CHOLESTEROL   Current Outpatient Medications (Analgesics):    acetaminophen (TYLENOL) 325 MG tablet, Take 650 mg by mouth at bedtime as needed for mild pain.   Current Outpatient Medications (Other):    acidophilus (RISAQUAD) CAPS capsule, Take 1 capsule by mouth daily.   Cholecalciferol (VITAMIN D3) 50 MCG (2000 UT) capsule, Take 4,000 Units by mouth daily.    Magnesium 250 MG TABS, Take 1 tablet by mouth daily.   traZODone (DESYREL) 150 MG tablet, Take  1/3  to 1/2  to 2/3 tablet  1 hour  before Bedtime for Sleep   vitamin C (ASCORBIC ACID) 500 MG tablet, Take 500 mg by mouth daily.   hyoscyamine (LEVSIN SL) 0.125 MG SL tablet, Take    1 to 2 tablets     every 4 hours       as needed for Nausea, Cramping, Bloating or Diarrhea DIARRHEA   ondansetron (ZOFRAN-ODT) 4 MG disintegrating  tablet, Take 1 tablet (4 mg total) by mouth every 8 (eight) hours as needed for nausea or vomiting. (Patient not taking: Reported on 05/13/2022)   Allergies  Allergen Reactions   Alendronate Other (See Comments)    "made me choke"     Risedronate Sodium Other (See Comments)    "made me choke"     Sulfa Antibiotics Other (See Comments)    Doesn't remember    Sulfasalazine Other (See Comments)    Doesn't remember    Other Rash   Tetracyclines & Related Rash    Current Problems (verified) Patient Active Problem List   Diagnosis Date Noted   Psychophysiological insomnia 10/06/2021   Bronchiectasis with acute lower respiratory infection ([place]) 03/20/2020   OSA (obstructive sleep apnea) 03/20/2020   Moderate persistent asthma with status asthmaticus 03/20/2020   Chemotherapy-induced lung disease 03/20/2020   Hypothyroidism 10/04/2018   Abnormal glucose 10/04/2018   Labile hypertension 10/04/2018   Cervical  dystonia 01/16/2018   Sinusitis, maxillary, chronic 10/19/2017   Atherosclerosis of aorta ([place]) by PET Scan 04/21/2018. 08/09/2017   Tremor 05/06/2016   Acquired hypogammaglobulinemia ([place]) 04/05/2016   Obstructive bronchiectasis ([place]) 03/18/2016   Body mass index (BMI) of 20.0-20.9 in adult 07/21/2015   History of DVT (deep vein thrombosis)    Normocytic anemia 01/04/2015   Mantle cell lymphoma ([place]) 10/18/2014   Medication management 07/15/2014   OSA and COPD overlap syndrome ([place]) 07/05/2014   Anxiety state 02/27/2014   Hyperlipidemia, mixed    Hypotension    GERD    Osteoporosis  (Refuses treatment)    Osteoarthritis    Vitamin D deficiency    IBS    Diverticulosis    History of colonic polyps 07/19/2011   Chronic obstructive airway disease with asthma ([place]) 03/16/2011    Screening Tests Health Maintenance  Topic Date Due   TETANUS/TDAP  Never done   Zoster Vaccines- Shingrix (1 of 2) Never done   COVID-19 Vaccine (4 - Pfizer risk series) 10/01/2020    COLONOSCOPY (Pts 45-14yr Insurance coverage will need to be confirmed)  02/04/2023 (Originally 02/02/2022)   Pneumonia Vaccine 81 Years old  Completed   DEXA SCAN  Completed   Hepatitis C Screening  Completed   HPV VACCINES  Aged Out   INFLUENZA VACCINE  Discontinued   Immunization History  Administered Date(s) Administered   PFIZER(Purple Top)SARS-COV-2 Vaccination 10/29/2019, 11/19/2019, 08/06/2020   Pneumococcal Conjugate-13 03/18/2016   Pneumococcal Polysaccharide-23 06/18/2013   Zoster, Live 04/17/2013    Bone marrow exam 2016 Ct sinus 10/2017 MRI brain w/wo 2019 Echo 02/2015 Holter 12/2017 Stress test 2015 Sleep study home study 11/2021  Prior vaccinations: TD or Tdap: declined  Influenza: declined Pneumococcal: 2014 Prevnar13: 2017 Shingles/Zostavax: 2014 Shingrix: will call insurance to see if covered Covid: PCreola2/2 and 1 booster, refuses any further Covid  Names of Other Physician/Practitioners you currently use: 1. [facility] here for primary care 2. [facility], eye doctor, last visit 06/14/2021  3. [facility], 10/2021 Patient Care Team: MUnk Pinto MD as PCP - General (Internal Medicine) SLadene Artist MD as Consulting Physician (Gastroenterology) MSatira Sark MD as Consulting Physician (Cardiology) Clance, KArmando Reichert MD as Consulting Physician (Pulmonary Disease) EVolanda Napoleon MD as Consulting Physician (Oncology) ANewton Pigg [facility] (Pharmacist)  SURGICAL HISTORY She  has a past surgical history that includes Appendectomy; Tonsillectomy; Bunionectomy; Knee arthroscopy; Umbilical hernia repair; Video bronchoscopy (Bilateral, 10/12/2017); Cataract extraction, bilateral; and IR REMOVAL TUN ACCESS W/ PORT W/O FL MOD SED (03/04/2020). FAMILY HISTORY Her family history includes Emphysema in her father; Heart attack (age of onset: 769 in her father; Heart disease in her father; Heart disease  (age of onset: 625 in her brother; Hypertension in her mother; Rheum arthritis in her sister; Thyroid disease in her mother. SOCIAL HISTORY She  reports that she has never smoked. She has never used smokeless tobacco. She reports that she does not drink alcohol and does not use drugs.    Review of Systems  Constitutional:  Positive for malaise/fatigue. Negative for chills, fever and weight loss.  HENT:  Negative for congestion, hearing loss, sore throat and tinnitus.   Eyes: Negative.  Negative for blurred vision and double vision.  Respiratory:  Negative for cough, shortness of breath and wheezing.   Cardiovascular:  Negative for chest pain, palpitations, orthopnea and leg swelling.  Gastrointestinal:  Positive for constipation. Negative for abdominal pain, blood in stool, diarrhea,  heartburn, melena, nausea and vomiting.  Genitourinary:  Negative for hematuria.  Musculoskeletal:  Positive for back pain. Negative for falls, joint pain and myalgias.  Skin: Negative.  Negative for rash.  Neurological:  Positive for tremors. Negative for dizziness, tingling, sensory change, loss of consciousness and headaches.  Psychiatric/Behavioral:  Negative for depression, memory loss and suicidal ideas. The patient is not nervous/anxious and does not have insomnia.      Objective:     Today's Vitals   05/13/22 1008  BP: 138/80  Pulse: 73  Temp: 97.9 F (36.6 C)  SpO2: 98%  Weight: 113 lb (51.3 kg)  Height: '5\' 4"'$  (1.626 m)    Body mass index is 19.4 kg/m.  General Appearance: Frail appearing elder, in no apparent distress. Eyes: PERRLA, EOMs, conjunctiva no swelling or erythema Sinuses: No Frontal/maxillary tenderness ENT/Mouth: Ext aud canals clear, TMs without erythema, bulging. No erythema, swelling, or exudate on post pharynx.  Tonsils not swollen or erythematous. Hearing normal.  Neck: Supple, thyroid normal.  Respiratory: Respiratory effort normal, BS equal bilaterally without rales,  rhonchi, wheezing or stridor.  Cardio: RRR with no MRGs. Very slow pulse Abdomen: Soft, + BS.  + epigastric tenderness, no guarding, rebound, hernias, masses. Lymphatics: Non tender without lymphadenopathy.  Musculoskeletal: Full ROM, 5/5 strength, Normal gait Skin: Warm, dry without rashes, lesions, ecchymosis.  Neuro: Cranial nerves intact. No cerebellar symptoms. Essential tremor noticed of head- slightly more pronounced Psych: Awake and oriented X 3, normal affect, Insight and Judgment appropriate. PHQ 2 score 1     [facility]  05/18/2022

## 2022-05-13 ENCOUNTER — Encounter: Payer: Self-pay | Admitting: Nurse Practitioner

## 2022-05-13 ENCOUNTER — Ambulatory Visit (INDEPENDENT_AMBULATORY_CARE_PROVIDER_SITE_OTHER): Payer: PPO | Admitting: Nurse Practitioner

## 2022-05-13 VITALS — BP 138/80 | HR 73 | Temp 97.9°F | Ht 64.0 in | Wt 113.0 lb

## 2022-05-13 DIAGNOSIS — G4733 Obstructive sleep apnea (adult) (pediatric): Secondary | ICD-10-CM | POA: Diagnosis not present

## 2022-05-13 DIAGNOSIS — E782 Mixed hyperlipidemia: Secondary | ICD-10-CM | POA: Diagnosis not present

## 2022-05-13 DIAGNOSIS — C8313 Mantle cell lymphoma, intra-abdominal lymph nodes: Secondary | ICD-10-CM

## 2022-05-13 DIAGNOSIS — F411 Generalized anxiety disorder: Secondary | ICD-10-CM

## 2022-05-13 DIAGNOSIS — D801 Nonfamilial hypogammaglobulinemia: Secondary | ICD-10-CM

## 2022-05-13 DIAGNOSIS — E559 Vitamin D deficiency, unspecified: Secondary | ICD-10-CM | POA: Diagnosis not present

## 2022-05-13 DIAGNOSIS — R0989 Other specified symptoms and signs involving the circulatory and respiratory systems: Secondary | ICD-10-CM | POA: Diagnosis not present

## 2022-05-13 DIAGNOSIS — R5383 Other fatigue: Secondary | ICD-10-CM

## 2022-05-13 DIAGNOSIS — Z79899 Other long term (current) drug therapy: Secondary | ICD-10-CM | POA: Diagnosis not present

## 2022-05-13 DIAGNOSIS — I700 Atherosclerosis of aorta: Secondary | ICD-10-CM | POA: Diagnosis not present

## 2022-05-13 DIAGNOSIS — J479 Bronchiectasis, uncomplicated: Secondary | ICD-10-CM | POA: Diagnosis not present

## 2022-05-13 DIAGNOSIS — E039 Hypothyroidism, unspecified: Secondary | ICD-10-CM

## 2022-05-13 DIAGNOSIS — J449 Chronic obstructive pulmonary disease, unspecified: Secondary | ICD-10-CM

## 2022-05-13 DIAGNOSIS — D509 Iron deficiency anemia, unspecified: Secondary | ICD-10-CM

## 2022-05-13 DIAGNOSIS — R7309 Other abnormal glucose: Secondary | ICD-10-CM

## 2022-05-13 DIAGNOSIS — K589 Irritable bowel syndrome without diarrhea: Secondary | ICD-10-CM

## 2022-05-13 MED ORDER — HYOSCYAMINE SULFATE 0.125 MG SL SUBL: SUBLINGUAL_TABLET | SUBLINGUAL | 0 refills | Status: DC

## 2022-05-13 NOTE — Patient Instructions (Signed)
  Therapists I recommend : Arbutus Leas PhD [address] [place] [address] ph [phone]- please try this one first   Waymon Budge [place], [place] [address] [place] [address] [place]  [phone]

## 2022-05-14 LAB — CBC WITH DIFFERENTIAL/PLATELET
Absolute Monocytes: 610 cells/uL (ref 200–950)
Basophils Absolute: 48 cells/uL (ref 0–200)
Basophils Relative: 1 %
Eosinophils Absolute: 139 cells/uL (ref 15–500)
Eosinophils Relative: 2.9 %
HCT: 43.5 % (ref 35.0–45.0)
Hemoglobin: 14.3 g/dL (ref 11.7–15.5)
Lymphs Abs: 835 cells/uL — ABNORMAL LOW (ref 850–3900)
MCH: 29.5 pg (ref 27.0–33.0)
MCHC: 32.9 g/dL (ref 32.0–36.0)
MCV: 89.9 fL (ref 80.0–100.0)
MPV: 10.2 fL (ref 7.5–12.5)
Monocytes Relative: 12.7 %
Neutro Abs: 3168 cells/uL (ref 1500–7800)
Neutrophils Relative %: 66 %
Platelets: 195 10*3/uL (ref 140–400)
RBC: 4.84 10*6/uL (ref 3.80–5.10)
RDW: 12.4 % (ref 11.0–15.0)
Total Lymphocyte: 17.4 %
WBC: 4.8 10*3/uL (ref 3.8–10.8)

## 2022-05-14 LAB — VITAMIN D 25 HYDROXY (VIT D DEFICIENCY, FRACTURES): Vit D, 25-Hydroxy: 63 ng/mL (ref 30–100)

## 2022-05-14 LAB — COMPLETE METABOLIC PANEL WITH GFR
AG Ratio: 2.6 (calc) — ABNORMAL HIGH (ref 1.0–2.5)
ALT: 19 U/L (ref 6–29)
AST: 32 U/L (ref 10–35)
Albumin: 4.5 g/dL (ref 3.6–5.1)
Alkaline phosphatase (APISO): 54 U/L (ref 37–153)
BUN: 15 mg/dL (ref 7–25)
CO2: 28 mmol/L (ref 20–32)
Calcium: 9.6 mg/dL (ref 8.6–10.4)
Chloride: 103 mmol/L (ref 98–110)
Creat: 0.96 mg/dL (ref 0.60–1.00)
Globulin: 1.7 g/dL (calc) — ABNORMAL LOW (ref 1.9–3.7)
Glucose, Bld: 85 mg/dL (ref 65–99)
Potassium: 3.8 mmol/L (ref 3.5–5.3)
Sodium: 138 mmol/L (ref 135–146)
Total Bilirubin: 0.6 mg/dL (ref 0.2–1.2)
Total Protein: 6.2 g/dL (ref 6.1–8.1)
eGFR: 61 mL/min/{1.73_m2} (ref >=60)

## 2022-05-14 LAB — LIPID PANEL
Cholesterol: 179 mg/dL (ref <200)
HDL: 66 mg/dL (ref >=50)
LDL Cholesterol (Calc): 95 mg/dL (calc)
Non-HDL Cholesterol (Calc): 113 mg/dL (calc) (ref <130)
Total CHOL/HDL Ratio: 2.7 (calc) (ref <5.0)
Triglycerides: 89 mg/dL (ref <150)

## 2022-05-14 LAB — IRON, TOTAL/TOTAL IRON BINDING CAP
%SAT: 27 % (calc) (ref 16–45)
Iron: 89 ug/dL (ref 45–160)
TIBC: 329 mcg/dL (calc) (ref 250–450)

## 2022-05-14 LAB — FERRITIN: Ferritin: 33 ng/mL (ref 16–288)

## 2022-05-14 LAB — VITAMIN B12: Vitamin B-12: 349 pg/mL (ref 200–1100)

## 2022-05-18 ENCOUNTER — Encounter: Payer: PPO | Admitting: Gastroenterology

## 2022-05-20 ENCOUNTER — Telehealth: Payer: Self-pay

## 2022-05-20 NOTE — Telephone Encounter (Signed)
LM-05/20/22-Called pt. And confirmed CP visit for 9/12 at 1:00PM.   Total time spent: 10 min.

## 2022-05-21 ENCOUNTER — Ambulatory Visit: Payer: PPO | Admitting: Gastroenterology

## 2022-05-21 ENCOUNTER — Encounter: Payer: Self-pay | Admitting: Gastroenterology

## 2022-05-21 VITALS — BP 132/80 | HR 68 | Ht 62.4 in | Wt 113.5 lb

## 2022-05-21 DIAGNOSIS — R195 Other fecal abnormalities: Secondary | ICD-10-CM | POA: Diagnosis not present

## 2022-05-21 DIAGNOSIS — R159 Full incontinence of feces: Secondary | ICD-10-CM | POA: Diagnosis not present

## 2022-05-21 DIAGNOSIS — K588 Other irritable bowel syndrome: Secondary | ICD-10-CM | POA: Diagnosis not present

## 2022-05-21 DIAGNOSIS — R140 Abdominal distension (gaseous): Secondary | ICD-10-CM

## 2022-05-21 MED ORDER — NA SULFATE-K SULFATE-MG SULF 17.5-3.13-1.6 GM/177ML PO SOLN: 1.0000 | Freq: Once | ORAL | 0 refills | Status: AC

## 2022-05-21 NOTE — Telephone Encounter (Signed)
LM-05/21/22-Updated pts. Focused outreach visit notes w/ recent GI visit and completed med review for adherence.  Total time spent: 9 min.

## 2022-05-21 NOTE — Progress Notes (Signed)
    Assessment     Positive Cologuard, rule out colorectal neoplasms Suspected IBS. R/O SIBO Fecal incontinence, stool leakage.   Recommendations    Schedule colonoscopy SIBO breath test Pelvic PT, Kegel exercises 5 times daily long-term   HPI    This is a 81 year old female with a positive Cologuard in June 2023, lower abdominal pain, abdominal bloating and leakage of stool.  She has a history of adenomatous colon polyps however her last colonoscopy was free of polyps.  She relates that Levsin helps her lower abdominal pain. She has mild ongoing constipation. She relates prior pelvic PT at [facility] in the past.   Colonoscopy May 2018 - Mild diverticulosis in the sigmoid colon.  - The examination was otherwise normal on direct and retroflexion views. - No specimens collected.   Labs / Imaging       Latest Ref Rng & Units 05/13/2022   10:34 AM 02/03/2022    2:38 PM 01/14/2022   10:22 AM  Hepatic Function  Total Protein 6.1 - 8.1 g/dL 6.2  6.1  6.7   Albumin 3.5 - 5.0 g/dL   4.6   AST 10 - 35 U/L 32  28  28   ALT 6 - 29 U/L $Remo'19  18  17   'fniYl$ Alk Phosphatase 38 - 126 U/L   58   Total Bilirubin 0.2 - 1.2 mg/dL 0.6  0.5  0.7        Latest Ref Rng & Units 05/13/2022   10:34 AM 02/03/2022    2:38 PM 01/14/2022   10:22 AM  CBC  WBC 3.8 - 10.8 Thousand/uL 4.8  5.1  5.9   Hemoglobin 11.7 - 15.5 g/dL 14.3  14.0  14.8   Hematocrit 35.0 - 45.0 % 43.5  42.7  45.7   Platelets 140 - 400 Thousand/uL 195  219  210     Current Medications, Allergies, Past Medical History, Past Surgical History, Family History and Social History were reviewed in [employer] record.   Physical Exam: General: Well developed, well nourished, thin, no acute distress Head: Normocephalic and atraumatic Eyes: Sclerae anicteric, EOMI Ears: Normal auditory acuity Mouth: Not examined Lungs: Clear throughout to auscultation Heart: Regular rate and rhythm; no murmurs, rubs or  bruits Abdomen: Soft and non distended.  Mild lower abdominal tenderness to deep palpation. No masses, hepatosplenomegaly or hernias noted. Normal Bowel sounds Rectal: Deferred to colonoscopy Musculoskeletal: Symmetrical with no gross deformities  Pulses:  Normal pulses noted Extremities: No clubbing, cyanosis, edema or deformities noted Neurological: Alert oriented x 4, grossly nonfocal, head/neck tremor Psychological:  Alert and cooperative. Normal mood and affect   Talan Gildner T. Fuller Plan, MD 05/21/2022, 9:30 AM

## 2022-05-21 NOTE — Patient Instructions (Signed)
You have been scheduled for a colonoscopy. Please follow written instructions given to you at your visit today.  Please pick up your prep supplies at the pharmacy within the next 1-3 days. If you use inhalers (even only as needed), please bring them with you on the day of your procedure.  You have been given a testing kit to check for small intestine bacterial overgrowth (SIBO) which is completed by a company named [facility]. Make sure to return your test in the mail using the return mailing label given to you along with the kit. Your demographic and insurance information have already been sent to the company and they should be in contact with you over the next 1-2 weeks regarding this test. [facility] will collect an upfront charge of $99.74 for commercial insurance plans and $209.74 is you are paying cash. Make sure to discuss with [facility] PRIOR to having the test to see if they have gotten information from your insurance company as to how much your testing will cost out of pocket, if any. Please keep in mind that you will be getting a call from phone number [phone] or a similar number. If you do not hear from them within this time frame, please call our office at [phone] or call [facility] directly at [phone].   We will refer you to [facility] for pelvic floor therapy and contact you with an appointment date and time.  Due to recent changes in healthcare laws, you may see the results of your imaging and laboratory studies on MyChart before your provider has had a chance to review them.  We understand that in some cases there may be results that are confusing or concerning to you. Not all laboratory results come back in the same time frame and the provider may be waiting for multiple results in order to interpret others.  Please give [place] 48 hours in order for your provider to thoroughly review all the results before contacting the office for clarification of  your results.   The [facility] providers would like to encourage you to use [facility] to communicate with providers for non-urgent requests or questions.  Due to long hold times on the telephone, sending your provider a message by [facility] may be a faster and more efficient way to get a response.  Please allow 48 business hours for a response.  Please remember that this is for non-urgent requests.   Thank you for choosing me and [facility].  Pricilla Riffle. Dagoberto Ligas., MD., [facility]  Kegel Exercises  Kegel exercises can help strengthen your pelvic floor muscles. The pelvic floor is a group of muscles that support your rectum, small intestine, and bladder. In females, pelvic floor muscles also help support the uterus. These muscles help you control the flow of urine and stool (feces). Kegel exercises are painless and simple. They do not require any equipment. Your provider may suggest Kegel exercises to: Improve bladder and bowel control. Improve sexual response. Improve weak pelvic floor muscles after surgery to remove the uterus (hysterectomy) or after pregnancy, in females. Improve weak pelvic floor muscles after prostate gland removal or surgery, in males. Kegel exercises involve squeezing your pelvic floor muscles. These are the same muscles you squeeze when you try to stop the flow of urine or keep from passing gas. The exercises can be done while sitting, standing, or lying down, but it is best to vary your position. Ask your health care provider which exercises are safe for you. Do exercises exactly  as told by your health care provider and adjust them as directed. Do not begin these exercises until told by your health care provider. Exercises How to do Kegel exercises: Squeeze your pelvic floor muscles tight. You should feel a tight lift in your rectal area. If you are a female, you should also feel a tightness in your vaginal area. Keep your stomach, buttocks, and legs relaxed. Hold  the muscles tight for up to 10 seconds. Breathe normally. Relax your muscles for up to 10 seconds. Repeat as told by your health care provider. Repeat this exercise daily as told by your health care provider. Continue to do this exercise for at least 4-6 weeks, or for as long as told by your health care provider. You may be referred to a physical therapist who can help you learn more about how to do Kegel exercises. Depending on your condition, your health care provider may recommend: Varying how long you squeeze your muscles. Doing several sets of exercises every day. Doing exercises for several weeks. Making Kegel exercises a part of your regular exercise routine. This information is not intended to replace advice given to you by your health care provider. Make sure you discuss any questions you have with your health care provider. Document Revised: 01/08/2021 Document Reviewed: 01/08/2021 Elsevier Patient Education  [place].

## 2022-05-25 ENCOUNTER — Ambulatory Visit (AMBULATORY_SURGERY_CENTER): Payer: PPO | Admitting: Gastroenterology

## 2022-05-25 ENCOUNTER — Encounter: Payer: Self-pay | Admitting: Gastroenterology

## 2022-05-25 ENCOUNTER — Telehealth: Payer: PPO | Admitting: Pharmacy Technician

## 2022-05-25 VITALS — BP 145/63 | HR 95 | Temp 97.1°F | Resp 16 | Ht 62.0 in | Wt 113.0 lb

## 2022-05-25 DIAGNOSIS — R195 Other fecal abnormalities: Secondary | ICD-10-CM | POA: Diagnosis not present

## 2022-05-25 DIAGNOSIS — D124 Benign neoplasm of descending colon: Secondary | ICD-10-CM

## 2022-05-25 DIAGNOSIS — D123 Benign neoplasm of transverse colon: Secondary | ICD-10-CM | POA: Diagnosis not present

## 2022-05-25 DIAGNOSIS — Z09 Encounter for follow-up examination after completed treatment for conditions other than malignant neoplasm: Secondary | ICD-10-CM | POA: Diagnosis not present

## 2022-05-25 DIAGNOSIS — Z86010 Personal history of colonic polyps: Secondary | ICD-10-CM | POA: Diagnosis not present

## 2022-05-25 DIAGNOSIS — D128 Benign neoplasm of rectum: Secondary | ICD-10-CM

## 2022-05-25 MED ORDER — SODIUM CHLORIDE 0.9 % IV SOLN: 500.0000 mL | Freq: Once | INTRAVENOUS | Status: DC

## 2022-05-25 NOTE — Progress Notes (Signed)
See 05/21/2022 H&P, no changes

## 2022-05-25 NOTE — Progress Notes (Signed)
Pt's states no medical or surgical changes since previsit or office visit. 

## 2022-05-25 NOTE — Progress Notes (Signed)
Called to room to assist during endoscopic procedure.  Patient ID and intended procedure confirmed with present staff. Received instructions for my participation in the procedure from the performing physician.  

## 2022-05-25 NOTE — Progress Notes (Signed)
To pacu, VSS. Report to [email or web address] 

## 2022-05-25 NOTE — Patient Instructions (Signed)
Information on polyps and diverticulosis given to you today.  Await pathology results.  Resume previous diet and medications.   YOU HAD AN ENDOSCOPIC PROCEDURE TODAY AT THE [facility]:   Refer to the procedure report that was given to you for any specific questions about what was found during the examination.  If the procedure report does not answer your questions, please call your gastroenterologist to clarify.  If you requested that your care partner not be given the details of your procedure findings, then the procedure report has been included in a sealed envelope for you to review at your convenience later.  YOU SHOULD EXPECT: Some feelings of bloating in the abdomen. Passage of more gas than usual.  Walking can help get rid of the air that was put into your GI tract during the procedure and reduce the bloating. If you had a lower endoscopy (such as a colonoscopy or flexible sigmoidoscopy) you may notice spotting of blood in your stool or on the toilet paper. If you underwent a bowel prep for your procedure, you may not have a normal bowel movement for a few days.  Please Note:  You might notice some irritation and congestion in your nose or some drainage.  This is from the oxygen used during your procedure.  There is no need for concern and it should clear up in a day or so.  SYMPTOMS TO REPORT IMMEDIATELY:  Following lower endoscopy (colonoscopy or flexible sigmoidoscopy):  Excessive amounts of blood in the stool  Significant tenderness or worsening of abdominal pains  Swelling of the abdomen that is new, acute  Fever of 100F or higher  For urgent or emergent issues, a gastroenterologist can be reached at any hour by calling [phone]. Do not use MyChart messaging for urgent concerns.    DIET:  We do recommend a small meal at first, but then you may proceed to your regular diet.  Drink plenty of fluids but you should avoid alcoholic beverages for 24  hours.  ACTIVITY:  You should plan to take it easy for the rest of today and you should NOT DRIVE or use heavy machinery until tomorrow (because of the sedation medicines used during the test).    FOLLOW UP: Our staff will call the number listed on your records the next business day following your procedure.  We will call around 7:15- 8:00 am to check on you and address any questions or concerns that you may have regarding the information given to you following your procedure. If we do not reach you, we will leave a message.     If any biopsies were taken you will be contacted by phone or by letter within the next 1-3 weeks.  Please call us at [phone] if you have not heard about the biopsies in 3 weeks.    SIGNATURES/CONFIDENTIALITY: You and/or your care partner have signed paperwork which will be entered into your electronic medical record.  These signatures attest to the fact that that the information above on your After Visit Summary has been reviewed and is understood.  Full responsibility of the confidentiality of this discharge information lies with you and/or your care-partner. 

## 2022-05-25 NOTE — Op Note (Signed)
[facility] Patient Name: Shannon Parsons Procedure Date: 05/25/2022 7:26 AM MRN: [id number] Endoscopist: Ladene Artist , MD Age: 81 Referring MD:  Date of Birth: 03/19/1941 Gender: Female Account #: [id number] Procedure:                Colonoscopy Indications:              Positive Cologuard test Medicines:                Monitored Anesthesia Care Procedure:                Pre-Anesthesia Assessment:                           - Prior to the procedure, a History and Physical                            was performed, and patient medications and                            allergies were reviewed. The patient's tolerance of                            previous anesthesia was also reviewed. The risks                            and benefits of the procedure and the sedation                            options and risks were discussed with the patient.                            All questions were answered, and informed consent                            was obtained. Prior Anticoagulants: The patient has                            taken no previous anticoagulant or antiplatelet                            agents. ASA Grade Assessment: II - A patient with                            mild systemic disease. After reviewing the risks                            and benefits, the patient was deemed in                            satisfactory condition to undergo the procedure.                           After obtaining informed consent, the colonoscope  was passed under direct vision. Throughout the                            procedure, the patient's blood pressure, pulse, and                            oxygen saturations were monitored continuously. The                            Colonoscope was introduced through the anus and                            advanced to the the cecum, identified by                            appendiceal orifice and ileocecal valve. The                             ileocecal valve, appendiceal orifice, and rectum                            were photographed. The quality of the bowel                            preparation was adequate. The colonoscopy was                            performed without difficulty. The patient tolerated                            the procedure well. Scope In: 8:07:16 AM Scope Out: 8:23:40 AM Scope Withdrawal Time: 0 hours 11 minutes 33 seconds  Total Procedure Duration: 0 hours 16 minutes 24 seconds  Findings:                 The perianal and digital rectal examinations were                            normal.                           Four sessile polyps were found in the rectum,                            descending colon and transverse colon (2). The                            polyps were 6 to 9 mm in size. These polyps were                            removed with a cold snare. Resection and retrieval                            were complete.  Multiple medium-mouthed diverticula were found in                            the left colon. There was no evidence of                            diverticular bleeding.                           The exam was otherwise without abnormality on                            direct and retroflexion views. Complications:            No immediate complications. Estimated blood loss:                            None. Estimated Blood Loss:     Estimated blood loss: none. Impression:               - Four 6 to 9 mm polyps in the rectum, in the                            descending colon and in the transverse colon,                            removed with a cold snare. Resected and retrieved.                           - Moderate diverticulosis in the left colon.                           - The examination was otherwise normal on direct                            and retroflexion views. Recommendation:           - Repeat colonoscopy vs no  repeat due to age after                            studies are complete for surveillance of multiple                            polyps.                           - Patient has a contact number available for                            emergencies. The signs and symptoms of potential                            delayed complications were discussed with the                            patient. Return to normal  activities tomorrow.                            Written discharge instructions were provided to the                            patient.                           - High fiber diet.                           - Continue present medications.                           - Await pathology results. Ladene Artist, MD 05/25/2022 8:26:30 AM This report has been signed electronically.

## 2022-05-26 ENCOUNTER — Telehealth: Payer: Self-pay | Admitting: *Deleted

## 2022-05-26 NOTE — Telephone Encounter (Signed)
  Follow up Call-     05/25/2022    7:28 AM  Call back number  Post procedure Call Back phone  # [phone]  Permission to leave phone message Yes     Patient questions:  Do you have a fever, pain , or abdominal swelling? No. Pain Score  0 *  Have you tolerated food without any problems? Yes.    Have you been able to return to your normal activities? Yes.    Do you have any questions about your discharge instructions: Diet   No. Medications  No. Follow up visit  No.  Do you have questions or concerns about your Care? No.  Actions: * If pain score is 4 or above: No action needed, pain <4.

## 2022-06-02 ENCOUNTER — Encounter: Payer: Self-pay | Admitting: Gastroenterology

## 2022-06-02 DIAGNOSIS — J449 Chronic obstructive pulmonary disease, unspecified: Secondary | ICD-10-CM | POA: Diagnosis not present

## 2022-06-02 DIAGNOSIS — G4733 Obstructive sleep apnea (adult) (pediatric): Secondary | ICD-10-CM | POA: Diagnosis not present

## 2022-06-02 DIAGNOSIS — J471 Bronchiectasis with (acute) exacerbation: Secondary | ICD-10-CM | POA: Diagnosis not present

## 2022-06-03 ENCOUNTER — Telehealth: Payer: Self-pay | Admitting: Gastroenterology

## 2022-06-03 NOTE — Telephone Encounter (Signed)
Spoke with patient regarding letter sent out yesterday to Dr. Fuller Plan. It has been discussed & she is aware she will also receive it in the mail.

## 2022-06-03 NOTE — Telephone Encounter (Signed)
Patient called regarding her lab results.Please advise.

## 2022-06-16 DIAGNOSIS — H524 Presbyopia: Secondary | ICD-10-CM | POA: Diagnosis not present

## 2022-06-16 DIAGNOSIS — H43813 Vitreous degeneration, bilateral: Secondary | ICD-10-CM | POA: Diagnosis not present

## 2022-06-16 DIAGNOSIS — H0100A Unspecified blepharitis right eye, upper and lower eyelids: Secondary | ICD-10-CM | POA: Diagnosis not present

## 2022-06-16 DIAGNOSIS — H04123 Dry eye syndrome of bilateral lacrimal glands: Secondary | ICD-10-CM | POA: Diagnosis not present

## 2022-06-16 DIAGNOSIS — H26493 Other secondary cataract, bilateral: Secondary | ICD-10-CM | POA: Diagnosis not present

## 2022-06-16 DIAGNOSIS — H5213 Myopia, bilateral: Secondary | ICD-10-CM | POA: Diagnosis not present

## 2022-06-16 DIAGNOSIS — H0100B Unspecified blepharitis left eye, upper and lower eyelids: Secondary | ICD-10-CM | POA: Diagnosis not present

## 2022-06-23 DIAGNOSIS — R159 Full incontinence of feces: Secondary | ICD-10-CM | POA: Diagnosis not present

## 2022-06-23 DIAGNOSIS — R278 Other lack of coordination: Secondary | ICD-10-CM | POA: Diagnosis not present

## 2022-06-23 DIAGNOSIS — M6281 Muscle weakness (generalized): Secondary | ICD-10-CM | POA: Diagnosis not present

## 2022-06-25 ENCOUNTER — Telehealth: Payer: Self-pay

## 2022-06-25 NOTE — Telephone Encounter (Signed)
Patient is aware that results have not been received yet for SIBO & that once we have received the fax we will let her know.

## 2022-06-25 NOTE — Telephone Encounter (Signed)
Patient requesting results from breath test. Please advise

## 2022-07-02 NOTE — Telephone Encounter (Signed)
Inbound call from patient inquiring about results. Please give patient a call to further advise.

## 2022-07-03 DIAGNOSIS — J471 Bronchiectasis with (acute) exacerbation: Secondary | ICD-10-CM | POA: Diagnosis not present

## 2022-07-03 DIAGNOSIS — G4733 Obstructive sleep apnea (adult) (pediatric): Secondary | ICD-10-CM | POA: Diagnosis not present

## 2022-07-03 DIAGNOSIS — J449 Chronic obstructive pulmonary disease, unspecified: Secondary | ICD-10-CM | POA: Diagnosis not present

## 2022-07-05 NOTE — Telephone Encounter (Signed)
I haven't received unless they are in my in box on my desk. I haven't been in the office for several days.

## 2022-07-05 NOTE — Telephone Encounter (Signed)
Spoke with patient & made her aware that I will check to see if Dr. Fuller Plan has received SIBO results when he returns to office tomorrow.

## 2022-07-06 NOTE — Patient Instructions (Incomplete)
Please continue using your CPAP regularly. While your insurance requires that you use CPAP at least 4 hours each night on 70% of the nights, I recommend, that you not skip any nights and use it throughout the night if you can. Getting used to CPAP and staying with the treatment long term does take time and patience and discipline. Untreated obstructive sleep apnea when it is moderate to severe can have an adverse impact on cardiovascular health and raise her risk for heart disease, arrhythmias, hypertension, congestive heart failure, stroke and diabetes. Untreated obstructive sleep apnea causes sleep disruption, nonrestorative sleep, and sleep deprivation. This can have an impact on your day to day functioning and cause daytime sleepiness and impairment of cognitive function, memory loss, mood disturbance, and problems focussing. Using CPAP regularly can improve these symptoms.   Continue trazodone up to '150mg'$  daily.   Follow up in 1 year

## 2022-07-06 NOTE — Progress Notes (Unsigned)
PATIENT: Shannon Parsons DOB: 1941/09/05  REASON FOR VISIT: follow up HISTORY FROM: patient  No chief complaint on file.    HISTORY OF PRESENT ILLNESS:  07/06/22 ALL:  DEBORRA PHEGLEY is a 81 y.o. female here today for follow up for OSA on CPAP.    She continues trazodone QHS. 1/3 tab???  HISTORY: (copied from Dr Dohmeier's previous note)  HPI:  Shannon Parsons is a 81 y.o. female patient , and here for a RV  10-06-21 on her apnea; she underwent a sleep study-: she had been a cpap user for many years before we re-evaluated her.  Last test was a HST on 02-18-2020   There was still a severe sleep apnea noted ( in comparison to PSG ) , AHI 40/h and REM sleep AHI of 36/h. Supine sleep is most prone to apnea. There was no prolonged hypoxemia.     Shannon Parsons is a 81 y.o. 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 Lavone Neri as a pulmonologist.  She has a history of hypertension, GERD, prediabetes, diverticulosis, generalized anxiety, cholesterol and vitamin D and is being treated almost being treated for a mantle cell carcinoma is Dr. Burney Gauze.  The treatment has been complicated when she developed a deep venous thrombosis.   She had received the Covid vaccine in February, she has been now fully vaccinated.  For the last 6 months she has not been feeling well it may by now be 8 months.  Her main problem is trouble sleeping and her CPAP has not been used in over 2 years. There was a good reason for her to avoid CPAP as she felt that it increased her frequency of bronchitis.  She has often sporadic nausea and is vomiting sometimes -and she has felt that lorazepam helped to overcome nausea as well as anxiety and introducing sleep.  These nausea spells happen not infrequently about 4 hours after she had a meal and she does not think it specific to any foods.  She  has no trouble swallowing food.    Shannon Parsons has mantel cell carcinoma, completed her therapy, remains very anxious and hypervigilant. Uses CPAP with reluctance- but reached an AHI of 0.5 /h ! Down from 40-50 AHI prior to therapy. She wakes up after 4-5 hours and takes the mask off, but feels unable to sleep longer.  Last morning she woke up with headaches, has had higher BP and thinks it is CPAP causing her to have these. Pulsation sensation and headaches affect the right eye pressure, and top of the head.  Her husband told her the right eye was "smaller".  She discontinued lexapro with Dr Melford Aase, after she felt this let to more headaches , no effect on insomnia, was only on medication for 4 weeks. Titubation tremor of the head is noted.    She is not wanting INSPIRE-  she doesn't use dentures and could benefit from a dental device.  She is afraid she couldn't afford it and that she would not tolerate a dental device. She elegantly directed the discussion to Xanax.  Trazodone should be used regularly- she can find help with dry mouth, dry eyes, and Trazodone can help with migraines and is memory friendly.    She has no history of glaucoma that may worsen under trazodone.    REVIEW OF SYSTEMS: Out of a  complete 14 system review of symptoms, the patient complains only of the following symptoms, and all other reviewed systems are negative.  ESS:  ALLERGIES: Allergies  Allergen Reactions   Alendronate Other (See Comments)    "made me choke"     Risedronate Sodium Other (See Comments)    "made me choke"     Sulfa Antibiotics Other (See Comments)    Doesn't remember    Sulfasalazine Other (See Comments)    Doesn't remember    Other Rash   Tetracyclines & Related Rash    HOME MEDICATIONS: Outpatient Medications Prior to Visit  Medication Sig Dispense Refill   acetaminophen (TYLENOL) 325 MG tablet Take 650 mg by mouth at bedtime as needed for mild pain.     acidophilus  (RISAQUAD) CAPS capsule Take 1 capsule by mouth daily.     Cholecalciferol (VITAMIN D3) 50 MCG (2000 UT) capsule Take 4,000 Units by mouth daily.      diazepam (VALIUM) 5 MG tablet TAKE 1/2 TO 1 TABLET 3 TIMES A DAY FOR TREMOR     hyoscyamine (LEVSIN SL) 0.125 MG SL tablet Take    1 to 2 tablets     every 4 hours       as needed for Nausea, Cramping, Bloating or Diarrhea DIARRHEA 90 tablet 0   levothyroxine (SYNTHROID) 50 MCG tablet TAKE 1 TABLET DAILY ON AN EMPTY STOMACH WITH ONLY WATER FOR 30 MINUTES & NO ANTACID MEDS, CALCIUM OR MAGNESIUM FOR 4 HOURS & AVOID BIOTIN 90 tablet 3   Magnesium 250 MG TABS Take 1 tablet by mouth as needed.     ondansetron (ZOFRAN-ODT) 4 MG disintegrating tablet Take 1 tablet (4 mg total) by mouth every 8 (eight) hours as needed for nausea or vomiting. (Patient not taking: Reported on 05/13/2022) 10 tablet 0   rosuvastatin (CRESTOR) 5 MG tablet TAKE 1/4 TABLET DAILY FOR CHOLESTEROL 23 tablet 1   traZODone (DESYREL) 150 MG tablet Take  1/3  to 1/2  to 2/3 tablet  1 hour  before Bedtime for Sleep 30 tablet 5   vitamin C (ASCORBIC ACID) 500 MG tablet Take 500 mg by mouth daily.     No facility-administered medications prior to visit.    PAST MEDICAL HISTORY: Past Medical History:  Diagnosis Date   Adenomatous colon polyp 1994   Asthma    Cataract 2013   bilateral eyes   Diverticulosis    Family history of ischemic heart disease    Fibrocystic breast disease    GERD (gastroesophageal reflux disease)    Hiatal hernia    Hyperlipidemia    IBS (irritable bowel syndrome)    Internal hemorrhoids    Mantle cell lymphoma ([place]) 10/18/2014   Melanoma ([place])    Facial, pt denies on 01/16/18   OSA (obstructive sleep apnea)    Osteoarthritis    Osteoporosis    Prediabetes    Unspecified hypothyroidism    Vitamin D deficiency     PAST SURGICAL HISTORY: Past Surgical History:  Procedure Laterality Date   APPENDECTOMY     BUNIONECTOMY     CATARACT EXTRACTION,  BILATERAL     IR REMOVAL TUN ACCESS W/ PORT W/O FL MOD SED  03/04/2020   KNEE ARTHROSCOPY     TONSILLECTOMY     UMBILICAL HERNIA REPAIR     VIDEO BRONCHOSCOPY Bilateral 10/12/2017   Procedure: VIDEO BRONCHOSCOPY WITHOUT FLUORO;  Surgeon: Tanda Rockers, MD;  Location: [facility];  Service: Endoscopy;  Laterality: Bilateral;  FAMILY HISTORY: Family History  Problem Relation Age of Onset   Emphysema Father    Heart disease Father    Heart attack Father 45       Died   Heart disease Brother 1   Rheum arthritis Sister    Hypertension Mother    Thyroid disease Mother    Colon cancer Neg Hx    Stomach cancer Neg Hx     SOCIAL HISTORY: Social History   Socioeconomic History   Marital status: Married    Spouse name: Not on file   Number of children: 0   Years of education: 14   Highest education level: Not on file  Occupational History   Occupation: retired from office work  Tobacco Use   Smoking status: Never   Smokeless tobacco: Never   Tobacco comments:    Never Used Tobacco  Vaping Use   Vaping Use: Never used  Substance and Sexual Activity   Alcohol use: No    Alcohol/week: 0.0 standard drinks of alcohol   Drug use: No   Sexual activity: Not on file  Other Topics Concern   Not on file  Social History Narrative   Patient lives with her husband in a two story home.  Has no children.  Retired from office work.  Education: some college.    Social Determinants of Health   Financial Resource Strain: Not on file  Food Insecurity: No Food Insecurity (08/17/2021)   Hunger Vital Sign    Worried About Running Out of Food in the Last Year: Never true    Ran Out of Food in the Last Year: Never true  Transportation Needs: Not on file  Physical Activity: Not on file  Stress: Not on file  Social Connections: Not on file  Intimate Partner Violence: Not on file     PHYSICAL EXAM  There were no vitals filed for this visit. There is no height or weight on file to  calculate BMI.  Generalized: Well developed, in no acute distress  Cardiology: normal rate and rhythm, no murmur noted Respiratory: clear to auscultation bilaterally  Neurological examination  Mentation: Alert oriented to time, place, history taking. Follows all commands speech and language fluent Cranial nerve II-XII: Pupils were equal round reactive to light. Extraocular movements were full, visual field were full  Motor: The motor testing reveals 5 over 5 strength of all 4 extremities. Good symmetric motor tone is noted throughout.  Gait and station: Gait is normal.    DIAGNOSTIC DATA (LABS, IMAGING, TESTING) - I reviewed patient records, labs, notes, testing and imaging myself where available.      No data to display           Lab Results  Component Value Date   WBC 4.8 05/13/2022   HGB 14.3 05/13/2022   HCT 43.5 05/13/2022   MCV 89.9 05/13/2022   PLT 195 05/13/2022      Component Value Date/Time   NA 138 05/13/2022 1034   NA 141 07/13/2017 0743   NA 138 08/01/2015 1124   K 3.8 05/13/2022 1034   K 4.2 07/13/2017 0743   K 4.0 08/01/2015 1124   CL 103 05/13/2022 1034   CL 105 07/13/2017 0743   CO2 28 05/13/2022 1034   CO2 27 07/13/2017 0743   CO2 28 08/01/2015 1124   GLUCOSE 85 05/13/2022 1034   GLUCOSE 109 07/13/2017 0743   BUN 15 05/13/2022 1034   BUN 16 07/13/2017 0743   BUN 10.4 08/01/2015 1124  CREATININE 0.96 05/13/2022 1034   CREATININE 0.8 08/01/2015 1124   CALCIUM 9.6 05/13/2022 1034   CALCIUM 9.5 07/13/2017 0743   CALCIUM 9.6 08/01/2015 1124   PROT 6.2 05/13/2022 1034   PROT 6.0 (L) 07/13/2017 0743   PROT 6.1 (L) 08/01/2015 1124   ALBUMIN 4.6 01/14/2022 1022   ALBUMIN 3.5 07/13/2017 0743   ALBUMIN 3.6 08/01/2015 1124   AST 32 05/13/2022 1034   AST 28 01/14/2022 1022   AST 23 08/01/2015 1124   ALT 19 05/13/2022 1034   ALT 17 01/14/2022 1022   ALT 19 07/13/2017 0743   ALT 12 08/01/2015 1124   ALKPHOS 58 01/14/2022 1022   ALKPHOS 68  07/13/2017 0743   ALKPHOS 88 08/01/2015 1124   BILITOT 0.6 05/13/2022 1034   BILITOT 0.7 01/14/2022 1022   BILITOT <0.30 08/01/2015 1124   GFRNONAA 53 (L) 01/14/2022 1022   GFRNONAA 59 (L) 11/10/2020 0957   GFRAA 68 11/10/2020 0957   Lab Results  Component Value Date   CHOL 179 05/13/2022   HDL 66 05/13/2022   LDLCALC 95 05/13/2022   TRIG 89 05/13/2022   CHOLHDL 2.7 05/13/2022   Lab Results  Component Value Date   HGBA1C 5.5 11/23/2021   Lab Results  Component Value Date   VITAMINB12 349 05/13/2022   Lab Results  Component Value Date   TSH 1.50 02/03/2022     ASSESSMENT AND PLAN 81 y.o. year old female  has a past medical history of Adenomatous colon polyp (1994), Asthma, Cataract (2013), Diverticulosis, Family history of ischemic heart disease, Fibrocystic breast disease, GERD (gastroesophageal reflux disease), Hiatal hernia, Hyperlipidemia, IBS (irritable bowel syndrome), Internal hemorrhoids, Mantle cell lymphoma ([place]) (10/18/2014), Melanoma ([place]), OSA (obstructive sleep apnea), Osteoarthritis, Osteoporosis, Prediabetes, Unspecified hypothyroidism, and Vitamin D deficiency. here with   No diagnosis found.    Shannon Parsons is doing well on CPAP therapy. Compliance report reveals ***. *** was encouraged to continue using CPAP nightly and for greater than 4 hours each night. We will update supply orders as indicated. Risks of untreated sleep apnea review and education materials provided. Healthy lifestyle habits encouraged. *** will follow up in ***, sooner if needed. *** verbalizes understanding and agreement with this plan.    No orders of the defined types were placed in this encounter.    No orders of the defined types were placed in this encounter.     Shannon Presto, FNP-C 07/06/2022, 12:56 PM [facility] [address] [place],  [address] [phone]

## 2022-07-07 ENCOUNTER — Encounter: Payer: Self-pay | Admitting: Family Medicine

## 2022-07-07 ENCOUNTER — Other Ambulatory Visit: Payer: Self-pay

## 2022-07-07 ENCOUNTER — Encounter: Payer: Self-pay | Admitting: Hematology & Oncology

## 2022-07-07 ENCOUNTER — Ambulatory Visit: Payer: PPO | Admitting: Family Medicine

## 2022-07-07 ENCOUNTER — Telehealth: Payer: Self-pay

## 2022-07-07 VITALS — BP 144/80 | HR 62 | Ht 64.0 in | Wt 110.0 lb

## 2022-07-07 DIAGNOSIS — G4733 Obstructive sleep apnea (adult) (pediatric): Secondary | ICD-10-CM

## 2022-07-07 DIAGNOSIS — F5104 Psychophysiologic insomnia: Secondary | ICD-10-CM | POA: Diagnosis not present

## 2022-07-07 MED ORDER — RIFAXIMIN 550 MG PO TABS: 550.0000 mg | ORAL_TABLET | Freq: Three times a day (TID) | ORAL | 0 refills | Status: DC

## 2022-07-07 MED ORDER — TRAZODONE HCL 150 MG PO TABS: ORAL_TABLET | ORAL | 11 refills | Status: DC

## 2022-07-07 NOTE — Telephone Encounter (Signed)
-----   Message from Ladene Artist, MD sent at 07/07/2022  3:37 PM EDT ----- Regarding: Beath test results Shannon Parsons,  Review breath test. Paper copy is on my desk.  Methanogen overgrowth Xifaxan 550 mg po tid x 14d, if too costly then cipro 500 mg po bid x 10d  MS

## 2022-07-07 NOTE — Telephone Encounter (Signed)
Spoke with patient regarding results & recommendations. Prescription sent to pharmacy. Advised she call back if symptoms persist or worsen & we can schedule follow up.

## 2022-07-08 ENCOUNTER — Other Ambulatory Visit (HOSPITAL_COMMUNITY): Payer: Self-pay

## 2022-07-08 ENCOUNTER — Encounter: Payer: Self-pay | Admitting: Hematology & Oncology

## 2022-07-08 ENCOUNTER — Telehealth: Payer: Self-pay

## 2022-07-08 NOTE — Telephone Encounter (Signed)
Pharmacy Patient Advocate Encounter   Received notification from [employer] that prior authorization for Xifaxan '550mg'$  is required/requested.   PA submitted on 07/08/2022 Key [id number] Status is pending  Joneen Boers, [place] Patient Advocate Specialist [place] Patient Advocate Team Direct Number: [phone] Fax: [phone]

## 2022-07-08 NOTE — Progress Notes (Signed)
Order sent to [employer] for the patient.

## 2022-07-09 ENCOUNTER — Other Ambulatory Visit (HOSPITAL_COMMUNITY): Payer: Self-pay

## 2022-07-09 DIAGNOSIS — M6281 Muscle weakness (generalized): Secondary | ICD-10-CM | POA: Diagnosis not present

## 2022-07-09 DIAGNOSIS — R151 Fecal smearing: Secondary | ICD-10-CM | POA: Diagnosis not present

## 2022-07-09 DIAGNOSIS — M62838 Other muscle spasm: Secondary | ICD-10-CM | POA: Diagnosis not present

## 2022-07-09 DIAGNOSIS — M6289 Other specified disorders of muscle: Secondary | ICD-10-CM | POA: Diagnosis not present

## 2022-07-09 NOTE — Telephone Encounter (Signed)
Reports Xifaxan approved but her copay is $700 and cannot afford  I told her Dr. Fuller Plan will review and determine if there is an alternative Tx she can try. She is aware she will not hear back until early next week

## 2022-07-09 NOTE — Telephone Encounter (Signed)
Pharmacy Patient Advocate Encounter  Received notification from [facility] that the request for prior authorization for Xifaxan '550mg'$  has been denied due to .     This determination is currently being reviewed for appeal.    Specialty Pharmacy Patient Advocate Fax:  [phone]

## 2022-07-09 NOTE — Telephone Encounter (Signed)
Patient Advocate Encounter   Received notification from [place] that prior authorization for Xifaxan '550mg'$  is required.   PA submitted on 07/09/2022 Key: [id number] Status is pending      Joneen Boers, [place] Patient Advocate Specialist [place] Patient Advocate Team Direct Number: [phone] Fax: [phone]

## 2022-07-11 NOTE — Telephone Encounter (Signed)
See 10/26 phone note for Cipro use

## 2022-07-12 ENCOUNTER — Other Ambulatory Visit: Payer: Self-pay

## 2022-07-12 MED ORDER — CIPROFLOXACIN HCL 500 MG PO TABS: 500.0000 mg | ORAL_TABLET | Freq: Two times a day (BID) | ORAL | 0 refills | Status: AC

## 2022-07-12 NOTE — Telephone Encounter (Signed)
Left message for patient to call back. Cipro sent to pharmacy.

## 2022-07-12 NOTE — Telephone Encounter (Signed)
Spoke with patient & she's aware of prescription that has been sent to pharmacy.

## 2022-07-13 ENCOUNTER — Telehealth: Payer: Self-pay | Admitting: Gastroenterology

## 2022-07-13 NOTE — Telephone Encounter (Signed)
Called patient & advised that she contact her PCP for potential referral to a dietician. Pt verbalized all understanding.

## 2022-07-13 NOTE — Telephone Encounter (Signed)
Inbound cal from patient wanting a recommendation for a dietician and or nutritionist. Please advise.  Thank you

## 2022-07-14 ENCOUNTER — Telehealth: Payer: Self-pay | Admitting: Nurse Practitioner

## 2022-07-14 NOTE — Telephone Encounter (Signed)
Pt was dx with SIBO by gastro dr, she is wanting to know if we have any recommendations for a nutritionist \

## 2022-07-15 NOTE — Telephone Encounter (Signed)
[facility] [address] [place], [place] [address] Phone: [phone]

## 2022-07-15 NOTE — Telephone Encounter (Signed)
Can you give her nutritionist info

## 2022-07-19 NOTE — Telephone Encounter (Signed)
Spoke with patient; gave her contact information for [facility]. We will also ask her oncologist for recommendation.

## 2022-07-20 ENCOUNTER — Inpatient Hospital Stay (HOSPITAL_BASED_OUTPATIENT_CLINIC_OR_DEPARTMENT_OTHER): Payer: PPO | Admitting: Hematology & Oncology

## 2022-07-20 ENCOUNTER — Telehealth: Payer: Self-pay | Admitting: *Deleted

## 2022-07-20 ENCOUNTER — Inpatient Hospital Stay: Payer: PPO | Attending: Hematology & Oncology

## 2022-07-20 ENCOUNTER — Encounter: Payer: Self-pay | Admitting: Hematology & Oncology

## 2022-07-20 VITALS — BP 156/73 | HR 62 | Temp 98.0°F | Resp 17 | Ht 64.0 in | Wt 111.1 lb

## 2022-07-20 DIAGNOSIS — C8310 Mantle cell lymphoma, unspecified site: Secondary | ICD-10-CM | POA: Insufficient documentation

## 2022-07-20 DIAGNOSIS — C8312 Mantle cell lymphoma, intrathoracic lymph nodes: Secondary | ICD-10-CM

## 2022-07-20 DIAGNOSIS — D801 Nonfamilial hypogammaglobulinemia: Secondary | ICD-10-CM | POA: Diagnosis not present

## 2022-07-20 DIAGNOSIS — K638219 Small intestinal bacterial overgrowth, unspecified: Secondary | ICD-10-CM | POA: Diagnosis not present

## 2022-07-20 DIAGNOSIS — C8313 Mantle cell lymphoma, intra-abdominal lymph nodes: Secondary | ICD-10-CM

## 2022-07-20 DIAGNOSIS — J479 Bronchiectasis, uncomplicated: Secondary | ICD-10-CM | POA: Diagnosis not present

## 2022-07-20 LAB — CMP (CANCER CENTER ONLY)
ALT: 20 U/L (ref 0–44)
AST: 35 U/L (ref 15–41)
Albumin: 4.7 g/dL (ref 3.5–5.0)
Alkaline Phosphatase: 60 U/L (ref 38–126)
Anion gap: 10 (ref 5–15)
BUN: 19 mg/dL (ref 8–23)
CO2: 28 mmol/L (ref 22–32)
Calcium: 10 mg/dL (ref 8.9–10.3)
Chloride: 100 mmol/L (ref 98–111)
Creatinine: 1.06 mg/dL — ABNORMAL HIGH (ref 0.44–1.00)
GFR, Estimated: 54 mL/min — ABNORMAL LOW (ref >60)
Glucose, Bld: 101 mg/dL — ABNORMAL HIGH (ref 70–99)
Potassium: 4.6 mmol/L (ref 3.5–5.1)
Sodium: 138 mmol/L (ref 135–145)
Total Bilirubin: 0.7 mg/dL (ref 0.3–1.2)
Total Protein: 6.8 g/dL (ref 6.5–8.1)

## 2022-07-20 LAB — CBC WITH DIFFERENTIAL (CANCER CENTER ONLY)
Abs Immature Granulocytes: 0.01 10*3/uL (ref 0.00–0.07)
Basophils Absolute: 0.1 10*3/uL (ref 0.0–0.1)
Basophils Relative: 2 %
Eosinophils Absolute: 0.3 10*3/uL (ref 0.0–0.5)
Eosinophils Relative: 4 %
HCT: 44 % (ref 36.0–46.0)
Hemoglobin: 13.9 g/dL (ref 12.0–15.0)
Immature Granulocytes: 0 %
Lymphocytes Relative: 23 %
Lymphs Abs: 1.5 10*3/uL (ref 0.7–4.0)
MCH: 28.8 pg (ref 26.0–34.0)
MCHC: 31.6 g/dL (ref 30.0–36.0)
MCV: 91.3 fL (ref 80.0–100.0)
Monocytes Absolute: 0.9 10*3/uL (ref 0.1–1.0)
Monocytes Relative: 14 %
Neutro Abs: 3.9 10*3/uL (ref 1.7–7.7)
Neutrophils Relative %: 57 %
Platelet Count: 206 10*3/uL (ref 150–400)
RBC: 4.82 MIL/uL (ref 3.87–5.11)
RDW: 13.7 % (ref 11.5–15.5)
WBC Count: 6.7 10*3/uL (ref 4.0–10.5)
nRBC: 0 % (ref 0.0–0.2)

## 2022-07-20 LAB — LACTATE DEHYDROGENASE: LDH: 215 U/L — ABNORMAL HIGH (ref 98–192)

## 2022-07-20 LAB — MAGNESIUM: Magnesium: 2.2 mg/dL (ref 1.7–2.4)

## 2022-07-20 NOTE — Progress Notes (Signed)
Hematology and Oncology Follow Up Visit  Shannon Parsons [id number] 1941/04/04 81 y.o. 07/20/2022   Principle Diagnosis:  Mantle cell lymphoma (MCIPI = 5) Thromboembolic event of the right leg Acquired Hypogammaglobulinemia Bronchiectasis  Current Therapy:   Rituxan - maintanence - s/p cycle #10 - to finish in 07/2017 IVIG - q  month -restart on 10/27/2017; last dose 01/05/2018     Interim History:    Shannon Parsons now comes in for follow-up.  We see her every 6 months.  She now has SIBO.  She has history of IBS.  She is on ciprofloxacin.  She has been if it is okay for her to take this.  From my point of view, I do not see a problem with her taking Cipro for 10 days.  She has had no problems with nausea or vomiting.  She has had no problems with fever.  Her main problem has been her poor husband has dementia.  He is still causing issues for her.  He really does not understand what he has which is part of the problem with dementia.  She has had no rashes.  There is been no swollen lymph nodes.  She has had no leg swelling.  She has had no bleeding.  Overall, her performance status is ECOG 1.    Medications:  Current Outpatient Medications:    acetaminophen (TYLENOL) 325 MG tablet, Take 650 mg by mouth at bedtime as needed for mild pain., Disp: , Rfl:    acidophilus (RISAQUAD) CAPS capsule, Take 1 capsule by mouth daily., Disp: , Rfl:    Cholecalciferol (VITAMIN D3) 50 MCG (2000 UT) capsule, Take 4,000 Units by mouth daily. , Disp: , Rfl:    ciprofloxacin (CIPRO) 500 MG tablet, Take 1 tablet (500 mg total) by mouth 2 (two) times daily for 10 days., Disp: 20 tablet, Rfl: 0   diazepam (VALIUM) 5 MG tablet, TAKE 1/2 TO 1 TABLET 3 TIMES A DAY FOR TREMOR, Disp: , Rfl:    hyoscyamine (LEVSIN SL) 0.125 MG SL tablet, Take    1 to 2 tablets     every 4 hours       as needed for Nausea, Cramping, Bloating or Diarrhea DIARRHEA, Disp: 90 tablet, Rfl: 0   levothyroxine (SYNTHROID) 50 MCG  tablet, TAKE 1 TABLET DAILY ON AN EMPTY STOMACH WITH ONLY WATER FOR 30 MINUTES & NO ANTACID MEDS, CALCIUM OR MAGNESIUM FOR 4 HOURS & AVOID BIOTIN, Disp: 90 tablet, Rfl: 3   Magnesium 250 MG TABS, Take 1 tablet by mouth as needed., Disp: , Rfl:    ondansetron (ZOFRAN-ODT) 4 MG disintegrating tablet, Take 1 tablet (4 mg total) by mouth every 8 (eight) hours as needed for nausea or vomiting., Disp: 10 tablet, Rfl: 0   rosuvastatin (CRESTOR) 5 MG tablet, TAKE 1/4 TABLET DAILY FOR CHOLESTEROL, Disp: 23 tablet, Rfl: 1   traZODone (DESYREL) 150 MG tablet, Take 1/2 to 1 tablet at bedtime, Disp: 30 tablet, Rfl: 11   vitamin C (ASCORBIC ACID) 500 MG tablet, Take 500 mg by mouth daily., Disp: , Rfl:   Allergies:  Allergies  Allergen Reactions   Alendronate Other (See Comments)    "made me choke"     Risedronate Sodium Other (See Comments)    "made me choke"     Sulfa Antibiotics Other (See Comments)    Doesn't remember    Sulfasalazine Other (See Comments)    Doesn't remember    Other Rash   Tetracyclines & Related Rash  Past Medical History, Surgical history, Social history, and Family History were reviewed and updated.  Review of Systems: Review of Systems  Constitutional:  Positive for malaise/fatigue.  HENT:  Positive for congestion.   Eyes: Negative.   Respiratory:  Positive for shortness of breath and wheezing.   Cardiovascular: Negative.   Gastrointestinal: Negative.   Genitourinary: Negative.   Musculoskeletal: Negative.   Skin: Negative.   Neurological: Negative.   Endo/Heme/Allergies: Negative.   Psychiatric/Behavioral: Negative.      Physical Exam:  height is '5\' 4"'$  (1.626 m) and weight is 111 lb 1.3 oz (50.4 kg). Her oral temperature is 98 F (36.7 C). Her blood pressure is 156/73 (abnormal) and her pulse is 62. Her respiration is 17 and oxygen saturation is 99%.   Physical Exam Vitals reviewed.  HENT:     Head: Normocephalic and atraumatic.  Eyes:     Pupils:  Pupils are equal, round, and reactive to light.  Cardiovascular:     Rate and Rhythm: Normal rate and regular rhythm.     Heart sounds: Normal heart sounds.  Pulmonary:     Effort: Pulmonary effort is normal.     Breath sounds: Normal breath sounds.  Abdominal:     General: Bowel sounds are normal.     Palpations: Abdomen is soft.  Musculoskeletal:        General: No tenderness or deformity. Normal range of motion.     Cervical back: Normal range of motion.  Lymphadenopathy:     Cervical: No cervical adenopathy.  Skin:    General: Skin is warm and dry.     Findings: No erythema or rash.  Neurological:     Mental Status: She is alert and oriented to person, place, and time.  Psychiatric:        Behavior: Behavior normal.        Thought Content: Thought content normal.        Judgment: Judgment normal.      Lab Results  Component Value Date   WBC 6.7 07/20/2022   HGB 13.9 07/20/2022   HCT 44.0 07/20/2022   MCV 91.3 07/20/2022   PLT 206 07/20/2022     Chemistry      Component Value Date/Time   NA 138 07/20/2022 1013   NA 141 07/13/2017 0743   NA 138 08/01/2015 1124   K 4.6 07/20/2022 1013   K 4.2 07/13/2017 0743   K 4.0 08/01/2015 1124   CL 100 07/20/2022 1013   CL 105 07/13/2017 0743   CO2 28 07/20/2022 1013   CO2 27 07/13/2017 0743   CO2 28 08/01/2015 1124   BUN 19 07/20/2022 1013   BUN 16 07/13/2017 0743   BUN 10.4 08/01/2015 1124   CREATININE 1.06 (H) 07/20/2022 1013   CREATININE 0.96 05/13/2022 1034   CREATININE 0.8 08/01/2015 1124      Component Value Date/Time   CALCIUM 10.0 07/20/2022 1013   CALCIUM 9.5 07/13/2017 0743   CALCIUM 9.6 08/01/2015 1124   ALKPHOS 60 07/20/2022 1013   ALKPHOS 68 07/13/2017 0743   ALKPHOS 88 08/01/2015 1124   AST 35 07/20/2022 1013   AST 23 08/01/2015 1124   ALT 20 07/20/2022 1013   ALT 19 07/13/2017 0743   ALT 12 08/01/2015 1124   BILITOT 0.7 07/20/2022 1013   BILITOT <0.30 08/01/2015 1124      Impression and  Plan: Shannon Parsons is 81 year old white female. She has mantle cell lymphoma. Again, by the [facility] she is an  intermediate risk.  She received 2 different courses of chemotherapy. She could not tolerate R-CHOP with Velcade substituting for vincristine. This however was incredibly helpful. We finished up with Rituxan/bendamustine. She completed this in August 2016.  We then started maintenance Rituxan. She started this in November 2016.  She completed this in November 2018.    I do not see a problem at all with respect to recurrence of her disease.  At some point, I think we can probably let her go from the clinic.  It has been 7 years that she completed chemotherapy.  Is been 5 years that she completed Rituxan.  I know she like to come in to see [place].  She feels confident with our evaluation and our examination.  I just feel bad that she has issues with her poor husband.  I know that he cannot help what he does because of the dementia.  We will plan to get her back in another 6 months.   Volanda Napoleon, MD 11/7/202311:32 AM

## 2022-07-20 NOTE — Telephone Encounter (Signed)
Per 09/29/21 los - gave upcoming appointments - confirmed °

## 2022-07-21 ENCOUNTER — Telehealth: Payer: Self-pay

## 2022-07-21 NOTE — Telephone Encounter (Signed)
LM-07/21/22-Chart review started. Reviewing OV, Consults, Labs and medication changes.  Total time spent: 9 min.

## 2022-07-23 NOTE — Telephone Encounter (Signed)
LM-07/23/22-Called pt. And completed General Review Call.  Total time spent: 7 min.

## 2022-08-02 ENCOUNTER — Other Ambulatory Visit: Payer: Self-pay | Admitting: Nurse Practitioner

## 2022-08-02 DIAGNOSIS — E782 Mixed hyperlipidemia: Secondary | ICD-10-CM

## 2022-08-09 DIAGNOSIS — M6281 Muscle weakness (generalized): Secondary | ICD-10-CM | POA: Diagnosis not present

## 2022-08-09 DIAGNOSIS — M62838 Other muscle spasm: Secondary | ICD-10-CM | POA: Diagnosis not present

## 2022-08-09 DIAGNOSIS — M6289 Other specified disorders of muscle: Secondary | ICD-10-CM | POA: Diagnosis not present

## 2022-08-09 DIAGNOSIS — R151 Fecal smearing: Secondary | ICD-10-CM | POA: Diagnosis not present

## 2022-08-12 ENCOUNTER — Encounter: Payer: Self-pay | Admitting: Gastroenterology

## 2022-08-12 ENCOUNTER — Ambulatory Visit: Payer: PPO | Admitting: Gastroenterology

## 2022-08-12 ENCOUNTER — Telehealth: Payer: Self-pay | Admitting: Gastroenterology

## 2022-08-12 VITALS — BP 116/60 | HR 67 | Ht 64.0 in | Wt 109.0 lb

## 2022-08-12 DIAGNOSIS — K219 Gastro-esophageal reflux disease without esophagitis: Secondary | ICD-10-CM | POA: Diagnosis not present

## 2022-08-12 DIAGNOSIS — R6881 Early satiety: Secondary | ICD-10-CM

## 2022-08-12 DIAGNOSIS — K2960 Other gastritis without bleeding: Secondary | ICD-10-CM | POA: Diagnosis not present

## 2022-08-12 MED ORDER — OMEPRAZOLE 40 MG PO CPDR: 40.0000 mg | DELAYED_RELEASE_CAPSULE | Freq: Every day | ORAL | 11 refills | Status: DC

## 2022-08-12 MED ORDER — OMEPRAZOLE 20 MG PO CPDR: 20.0000 mg | DELAYED_RELEASE_CAPSULE | Freq: Every day | ORAL | 11 refills | Status: DC

## 2022-08-12 NOTE — Telephone Encounter (Signed)
Left message for patient to return my call.

## 2022-08-12 NOTE — Telephone Encounter (Signed)
Patient called states Dr. Fuller Plan would like for her to start Omeprazole 40 mg and she would like to change that to 20 mg instead to see how it works.

## 2022-08-12 NOTE — Patient Instructions (Signed)
We have sent the following medications to your pharmacy for you to pick up at your convenience: omeprazole.  Patient advised to avoid spicy, acidic, citrus, chocolate, mints, fruit and fruit juices.  Limit the intake of caffeine, alcohol and Soda.  Don't exercise too soon after eating.  Don't lie down within 3-4 hours of eating.  Elevate the head of your bed.   The [facility] providers would like to encourage you to use [facility] to communicate with providers for non-urgent requests or questions.  Due to long hold times on the telephone, sending your provider a message by [facility] may be a faster and more efficient way to get a response.  Please allow 48 business hours for a response.  Please remember that this is for non-urgent requests.   Thank you for choosing me and [facility].  Pricilla Riffle. Dagoberto Ligas., MD., Mar[facility] Regal

## 2022-08-12 NOTE — Telephone Encounter (Signed)
OK to change to omeprazole 20 mg po qd per her request

## 2022-08-12 NOTE — Telephone Encounter (Signed)
Patient states she did not realize omeprazole had a lower dose until she saw her husbands prescription bottle. Patient states she wants to start on the lowest dose and increase if needed. Please advise if I can send omeprazole 20 mg daily to patient's pharmacy.

## 2022-08-12 NOTE — Progress Notes (Signed)
    Assessment     Suspected active GERD, gastritis.  History of esophageal stricture IBS, SIBO recently treated with Cipro Personal history of adenomatous colon polyps, no longer in surveillance due to age   Recommendations    Begin omeprazole 40 mg daily and follow antireflux measures.  If symptoms do not resolve over the next few weeks consider further evaluation with abdominal imaging and EGD. Continue hyoscyamine 0.125 mg 1 to 2 tablets every 4 hours as needed REV in 1 month   HPI    This is a 81 year old female who relates early satiety and fullness.  She has had several episodes of either regurgitation or vomiting.  She has not been on an acid suppressant in the past few years.  SIBO was recently diagnosed and she could not obtain Xifaxan due to cost so she was treated with a course of Cipro x 10d with improvement in symptoms.  No dysphagia, odynophagia, hematemesis, weight loss.  EGD Aug 2015 1. Stricture at the gastroesophageal junction 2. Small hiatal hernia 3. Erosive gastritis in the gastric antrum; multiple biopsies 4. Gastritis in the gastric body; multiple biopsies   Labs / Imaging       Latest Ref Rng & Units 07/20/2022   10:13 AM 05/13/2022   10:34 AM 02/03/2022    2:38 PM  Hepatic Function  Total Protein 6.5 - 8.1 g/dL 6.8  6.2  6.1   Albumin 3.5 - 5.0 g/dL 4.7     AST 15 - 41 U/L 35  32  28   ALT 0 - 44 U/L _0 Alk Phosphatase 38 - 126 U/L 60     Total Bilirubin 0.3 - 1.2 mg/dL 0.7  0.6  0.5        Latest Ref Rng & Units 07/20/2022   10:13 AM 05/13/2022   10:34 AM 02/03/2022    2:38 PM  CBC  WBC 4.0 - 10.5 K/uL 6.7  4.8  5.1   Hemoglobin 12.0 - 15.0 g/dL 13.9  14.3  14.0   Hematocrit 36.0 - 46.0 % 44.0  43.5  42.7   Platelets 150 - 400 K/uL 206  195  219     Current Medications, Allergies, Past Medical History, Past Surgical History, Family History and Social History were reviewed in [employer] record.   Physical  Exam: General: Well developed, well nourished, thin, no acute distress Head: Normocephalic and atraumatic Eyes: Sclerae anicteric, EOMI Ears: Normal auditory acuity Mouth: No deformities or lesions noted Lungs: Clear throughout to auscultation Heart: Regular rate and rhythm; No murmurs, rubs or bruits Abdomen: Soft, non tender and non distended. No masses, hepatosplenomegaly or hernias noted. Normal Bowel sounds Rectal: Not done Musculoskeletal: Symmetrical with no gross deformities  Pulses:  Normal pulses noted Extremities: No clubbing, cyanosis, edema or deformities noted Neurological: Alert oriented x 4, grossly nonfocal Psychological:  Alert and cooperative. Normal mood and affect   Cipriano Millikan T. Fuller Plan, MD 08/12/2022, 10:43 AM

## 2022-08-12 NOTE — Telephone Encounter (Signed)
Omeprazole 20 mg sent to patient's pharmacy.

## 2022-09-01 DIAGNOSIS — L308 Other specified dermatitis: Secondary | ICD-10-CM | POA: Diagnosis not present

## 2022-09-01 DIAGNOSIS — L814 Other melanin hyperpigmentation: Secondary | ICD-10-CM | POA: Diagnosis not present

## 2022-09-01 DIAGNOSIS — L853 Xerosis cutis: Secondary | ICD-10-CM | POA: Diagnosis not present

## 2022-09-01 DIAGNOSIS — D1801 Hemangioma of skin and subcutaneous tissue: Secondary | ICD-10-CM | POA: Diagnosis not present

## 2022-09-01 DIAGNOSIS — L821 Other seborrheic keratosis: Secondary | ICD-10-CM | POA: Diagnosis not present

## 2022-09-01 DIAGNOSIS — D224 Melanocytic nevi of scalp and neck: Secondary | ICD-10-CM | POA: Diagnosis not present

## 2022-09-02 DIAGNOSIS — M6281 Muscle weakness (generalized): Secondary | ICD-10-CM | POA: Diagnosis not present

## 2022-09-02 DIAGNOSIS — R151 Fecal smearing: Secondary | ICD-10-CM | POA: Diagnosis not present

## 2022-09-02 DIAGNOSIS — M6289 Other specified disorders of muscle: Secondary | ICD-10-CM | POA: Diagnosis not present

## 2022-09-02 DIAGNOSIS — R159 Full incontinence of feces: Secondary | ICD-10-CM | POA: Diagnosis not present

## 2022-09-02 DIAGNOSIS — M62838 Other muscle spasm: Secondary | ICD-10-CM | POA: Diagnosis not present

## 2022-09-08 ENCOUNTER — Encounter: Payer: Self-pay | Admitting: Internal Medicine

## 2022-09-08 ENCOUNTER — Ambulatory Visit (INDEPENDENT_AMBULATORY_CARE_PROVIDER_SITE_OTHER): Payer: PPO | Admitting: Internal Medicine

## 2022-09-08 ENCOUNTER — Ambulatory Visit
Admission: RE | Admit: 2022-09-08 | Discharge: 2022-09-08 | Disposition: A | Discharge status: Home / Self Care | Payer: PPO | Source: Ambulatory Visit | Attending: Internal Medicine | Admitting: Internal Medicine

## 2022-09-08 VITALS — BP 130/80 | HR 69 | Temp 97.9°F | Resp 17 | Ht 64.0 in | Wt 109.8 lb

## 2022-09-08 DIAGNOSIS — R079 Chest pain, unspecified: Secondary | ICD-10-CM

## 2022-09-08 DIAGNOSIS — R0789 Other chest pain: Secondary | ICD-10-CM | POA: Diagnosis not present

## 2022-09-08 DIAGNOSIS — R1011 Right upper quadrant pain: Secondary | ICD-10-CM

## 2022-09-08 DIAGNOSIS — I700 Atherosclerosis of aorta: Secondary | ICD-10-CM | POA: Diagnosis not present

## 2022-09-08 DIAGNOSIS — R112 Nausea with vomiting, unspecified: Secondary | ICD-10-CM | POA: Diagnosis not present

## 2022-09-08 DIAGNOSIS — R0781 Pleurodynia: Secondary | ICD-10-CM | POA: Diagnosis not present

## 2022-09-08 DIAGNOSIS — C8313 Mantle cell lymphoma, intra-abdominal lymph nodes: Secondary | ICD-10-CM | POA: Diagnosis not present

## 2022-09-08 DIAGNOSIS — J449 Chronic obstructive pulmonary disease, unspecified: Secondary | ICD-10-CM | POA: Diagnosis not present

## 2022-09-08 DIAGNOSIS — M4185 Other forms of scoliosis, thoracolumbar region: Secondary | ICD-10-CM | POA: Diagnosis not present

## 2022-09-08 MED ORDER — DEXAMETHASONE 4 MG PO TABS: ORAL_TABLET | ORAL | 0 refills | Status: DC

## 2022-09-08 MED ORDER — ONDANSETRON 8 MG PO TBDP: ORAL_TABLET | ORAL | 0 refills | Status: DC

## 2022-09-08 NOTE — Progress Notes (Signed)
Future Appointments  Date Time Provider Department  09/16/2022  9:30 AM Ladene Artist, MD [facility]  11/23/2022 11:00 AM Unk Pinto, MD [facility]  01/18/2023 10:30 AM Volanda Napoleon, MD [facility]  07/04/2023 11:00 AM Debbora Presto, NP [facility]    History of Present Illness:        The patient is a very nice 81 y.o.  MWF with labile HTN, HLD, Hypothyroidism, Prediabetes, Essential Hereditary Tremor,  OSA on CPAP ,  hx/o  Mantle Cell Lymphoma  (2016) and Vitamin D Deficiency who presents with c/o 1-2 week prodrome of tender Left anterolateral ribs  / chest wall. Shew also is c/o pains / discomfort in rthe RUQ abdomen.           Medications  Current Outpatient Medications (Endocrine & Metabolic):    levothyroxine (SYNTHROID) 50 MCG tablet, TAKE 1 TABLET DAILY ON AN EMPTY STOMACH WITH ONLY WATER FOR 30 MINUTES & NO ANTACID MEDS, CALCIUM OR MAGNESIUM FOR 4 HOURS & AVOID BIOTIN  Current Outpatient Medications (Cardiovascular):    rosuvastatin (CRESTOR) 5 MG tablet, TAKE 1/4 TABLET DAILY FOR CHOLESTEROL   Current Outpatient Medications (Analgesics):    acetaminophen (TYLENOL) 325 MG tablet, Take 650 mg by mouth at bedtime as needed for mild pain.   Current Outpatient Medications (Other):    acidophilus (RISAQUAD) CAPS capsule, Take 1 capsule by mouth daily.   Cholecalciferol (VITAMIN D3) 50 MCG (2000 UT) capsule, Take 4,000 Units by mouth daily.    diazepam (VALIUM) 5 MG tablet, TAKE 1/2 TO 1 TABLET 3 TIMES A DAY FOR TREMOR   hyoscyamine (LEVSIN SL) 0.125 MG SL tablet, Take    1 to 2 tablets     every 4 hours       as needed for Nausea, Cramping, Bloating or Diarrhea DIARRHEA   Magnesium 250 MG TABS, Take 1 tablet by mouth as needed.   omeprazole (PRILOSEC) 20 MG capsule, Take 1 capsule (20 mg total) by mouth daily.   ondansetron (ZOFRAN-ODT) 4 MG disintegrating tablet, Take 1 tablet (4 mg total) by mouth every 8 (eight) hours as needed for nausea or vomiting.   traZODone  (DESYREL) 150 MG tablet, Take 1/2 to 1 tablet at bedtime   vitamin C (ASCORBIC ACID) 500 MG tablet, Take 500 mg by mouth daily.  Problem list She has Chronic obstructive airway disease with asthma; History of colonic polyps; Hyperlipidemia, mixed; Hypotension; GERD; Osteoporosis  (Refuses treatment); Osteoarthritis; Vitamin D deficiency; IBS; Diverticulosis; Anxiety state; OSA and COPD overlap syndrome ([place]); Medication management; Mantle cell lymphoma ([place]); Normocytic anemia; History of DVT (deep vein thrombosis); Body mass index (BMI) of 20.0-20.9 in adult; Obstructive bronchiectasis ([place]); Acquired hypogammaglobulinemia ([place]); Atherosclerosis of aorta ([place]) by PET Scan 04/21/2018.; Sinusitis, maxillary, chronic; Cervical dystonia; Tremor; Hypothyroidism; Abnormal glucose; Labile hypertension; Bronchiectasis with acute lower respiratory infection ([place]); OSA (obstructive sleep apnea); Moderate persistent asthma with status asthmaticus; Chemotherapy-induced lung disease; and Psychophysiological insomnia on their problem list.   Observations/Objective:  BP 130/80   Pulse 69   Temp 97.9 F (36.6 C)   Resp 17   Ht '5\' 4"'$  (1.626 m)   Wt 109 lb 12.8 oz (49.8 kg)   LMP 09/14/2014   SpO2 97%   BMI 18.85 kg/m   HEENT - WNL. Neck - supple.  Chest - Clear equal BS. Cor - Nl HS. RRR w/o sig MGR. PP 1(+). No edema. MS- FROM w/o deformities.  Gait Nl. Neuro -  Nl w/o focal abnormalities.  Assessment and Plan:      Follow Up Instructions:        I discussed the assessment and treatment plan with the patient. The patient was provided an opportunity to ask questions and all were answered. The patient agreed with the plan and demonstrated an understanding of the instructions.       The patient was advised to call back or seek an in-person evaluation if the symptoms worsen or if the condition fails to improve as anticipated.    Kirtland Bouchard, MD

## 2022-09-08 NOTE — Progress Notes (Signed)
Future Appointments  Date Time Provider Department  09/16/2022  9:30 AM Ladene Artist, MD [facility]  11/23/2022 11:00 AM Unk Pinto, MD [facility]  01/18/2023 10:00 AM [facility]  01/18/2023 10:30 AM Marin Olp, Rudell Cobb, MD [facility]  07/04/2023 11:00 AM Lomax, Amy, NP [facility]    History of Present Illness:     The patient is a very nice 81 y.o.  MWF pres with  labile HTN, HLD, Hypothyroidism, Prediabetes, Essential Hereditary Tremor,  OSA on CPAP, Mantle Cell Lymphoma ( 2016)  and Vitamin D Deficiency. Who presents with a prodrome of increasing pain /soreness in her left antero-lateral chest wall. She is also c/o pains in her RUQ Abdomen not associated with heartburn / reflux, but does report upper midline abdominal bloating sensation , nausea & vomiting.   Medications  Current Outpatient Medications (Endocrine & Metabolic):    dexamethasone (DECADRON) 4 MG tablet, Take 1 tab 3 x /day for 2 days, then 2 x /day for 2  Days,  then 1 tab daily   levothyroxine (SYNTHROID) 50 MCG tablet, TAKE 1 TABLET DAILY ON AN EMPTY STOMACH WITH ONLY WATER FOR 30 MINUTES & NO ANTACID MEDS, CALCIUM OR MAGNESIUM FOR 4 HOURS & AVOID BIOTIN  Current Outpatient Medications (Cardiovascular):    rosuvastatin (CRESTOR) 5 MG tablet, TAKE 1/4 TABLET DAILY FOR CHOLESTEROL   Current Outpatient Medications (Analgesics):    acetaminophen (TYLENOL) 325 MG tablet, Take 650 mg by mouth at bedtime as needed for mild pain.   Current Outpatient Medications (Other):    acidophilus (RISAQUAD) CAPS capsule, Take 1 capsule by mouth daily.   Cholecalciferol (VITAMIN D3) 50 MCG (2000 UT) capsule, Take 4,000 Units by mouth daily.    diazepam (VALIUM) 5 MG tablet, TAKE 1/2 TO 1 TABLET 3 TIMES A DAY FOR TREMOR   hyoscyamine (LEVSIN SL) 0.125 MG SL tablet, Take    1 to 2 tablets     every 4 hours       as needed for Nausea, Cramping, Bloating or Diarrhea DIARRHEA   Magnesium 250 MG TABS, Take 1 tablet by mouth as  needed.   omeprazole (PRILOSEC) 20 MG capsule, Take 1 capsule (20 mg total) by mouth daily.   ondansetron (ZOFRAN-ODT) 8 MG disintegrating tablet, Dissolve 1 tablet   tongue every 6 hours as needed for severe Nausea   traZODone (DESYREL) 150 MG tablet, Take 1/2 to 1 tablet at bedtime   vitamin C (ASCORBIC ACID) 500 MG tablet, Take 500 mg by mouth daily.  Problem list She has Chronic obstructive airway disease with asthma; History of colonic polyps; Hyperlipidemia, mixed; Hypotension; GERD; Osteoporosis  (Refuses treatment); Osteoarthritis; Vitamin D deficiency; IBS; Diverticulosis; Anxiety state; OSA and COPD overlap syndrome ([place]); Medication management; Mantle cell lymphoma ([place]); Normocytic anemia; History of DVT (deep vein thrombosis); Body mass index (BMI) of 20.0-20.9 in adult; Obstructive bronchiectasis ([place]); Acquired hypogammaglobulinemia ([place]); Atherosclerosis of aorta ([place]) by PET Scan 04/21/2018.; Sinusitis, maxillary, chronic; Cervical dystonia; Tremor; Hypothyroidism; Abnormal glucose; Labile hypertension; Bronchiectasis with acute lower respiratory infection ([place]); OSA (obstructive sleep apnea); Moderate persistent asthma with status asthmaticus; Chemotherapy-induced lung disease; and Psychophysiological insomnia on their problem list.   Observations/Objective:  BP 130/80   Pulse 69   Temp 97.9 F (36.6 C)   Resp 17   Ht '5\' 4"'$  (1.626 m)   Wt 109 lb 12.8 oz (49.8 kg)   LMP 09/14/2014   SpO2 97%   BMI 18.85 kg/m   HEENT - WNL. Neck -  supple.  Chest - Clear equal BS. (+) tender in Left anterolateral chest wall & ribs.  Cor - Nl HS. RRR w/o sig MGR. PP 1(+). No edema. Abd - Soft with RUQ tenderness w/o guarding or rebound  MS- FROM w/o deformities.  Gait Nl. Neuro -  Nl w/o focal abnormalities.   Assessment and Plan:   1. Chest pain, unspecified typem ? Costochondritis r/o mets , etc.  - DG Ribs Unilateral Left; Future - DG Chest 2 View; Future  - Empiric trial with  steroids to see if will help  - dexamethasone 4 MG tablet;  Take 1 tab 3 x /day for 2 days, then         2 x /day for 2  Days,  then 1 tab daily   Dispense: 13 tablet; Refill: 0  2. RUQ abdominal pain  - US Abdomen Complete; Future  3. Mantle cell lymphoma of intra-abdominal lymph nodes , hx/o ([place])   4. Nausea and vomiting  - ondansetron (ZOFRAN-ODT) 8 MG disintegrating tablet;  Dissolve 1 tablet   tongue every 6 hours as needed for severe Nausea   Dispense: 20 tablet; Refill: 0   Follow Up Instructions:        I discussed the assessment and treatment plan with the patient. The patient was provided an opportunity to ask questions and all were answered. The patient agreed with the plan and demonstrated an understanding of the instructions. .         The patient was advised to call back or seek an in-person evaluation if the symptoms worsen or if the condition fails to improve as anticipated.   Kirtland Bouchard, MD

## 2022-09-09 ENCOUNTER — Other Ambulatory Visit: Payer: Self-pay | Admitting: Internal Medicine

## 2022-09-09 DIAGNOSIS — R079 Chest pain, unspecified: Secondary | ICD-10-CM

## 2022-09-09 MED ORDER — PREDNISONE 10 MG PO TABS: ORAL_TABLET | ORAL | 0 refills | Status: DC

## 2022-09-10 NOTE — Progress Notes (Signed)
<><><><><><><><><><><><><><><><><><><><><><><><><><><><><><><><><> <><><><><><><><><><><><><><><><><><><><><><><><><><><><><><><><><>  -    CXR  & Rib X-rays  show COPD presumed                                                                  from 2sd hand cigarette smoke in the past  - There is no sign / evidence of cancer in the lungs, bones or ribs which                                                                was the main reason for ordering the X-rays   - The diagnosis is " Costochondritis " or "chest wall inflammation"  ~ like                                                                  " Arthritis in the chest wall around the ribs "   <><><><><><><><><><><><><><><><><><><><><><><><><><><><><><><><><> <><><><><><><><><><><><><><><><><><><><><><><><><><><><><><><><><>

## 2022-09-16 ENCOUNTER — Encounter: Payer: Self-pay | Admitting: Gastroenterology

## 2022-09-16 ENCOUNTER — Ambulatory Visit: Payer: PPO | Admitting: Gastroenterology

## 2022-09-16 VITALS — BP 115/82 | HR 68 | Ht 64.0 in | Wt 109.0 lb

## 2022-09-16 DIAGNOSIS — Z860101 Personal history of adenomatous and serrated colon polyps: Secondary | ICD-10-CM

## 2022-09-16 DIAGNOSIS — Z86010 Personal history of colonic polyps: Secondary | ICD-10-CM | POA: Diagnosis not present

## 2022-09-16 DIAGNOSIS — Z8719 Personal history of other diseases of the digestive system: Secondary | ICD-10-CM

## 2022-09-16 DIAGNOSIS — K219 Gastro-esophageal reflux disease without esophagitis: Secondary | ICD-10-CM

## 2022-09-16 NOTE — Progress Notes (Signed)
    Assessment     GERD, gastritis, history of esophageal stricture IBS with intermittent bloating and mild constipation  SIBO recently treated with Cipro Personal history of adenomatous colon polyps no longer in surveillance due to age   Recommendations    Continue omeprazole 40 mg daily ideally taken 30 minutes before meal and follow antireflux measures long-term Hyoscyamine 0.125 mg 1-2 every 4 hours as needed.  Magnesium tablets daily as needed. REV in 1 year.   HPI    This is a 82 year old female with GERD, gastritis and a history of esophageal stricture.  She returns for follow-up after beginning omeprazole 40 mg daily and her reflux symptoms have come under very good control.  She notes occasional mild abdominal bloating.  She has mild constipation and takes magnesium tablets intermittently with good results.   Labs / Imaging       Latest Ref Rng & Units 07/20/2022   10:13 AM 05/13/2022   10:34 AM 02/03/2022    2:38 PM  Hepatic Function  Total Protein 6.5 - 8.1 g/dL 6.8  6.2  6.1   Albumin 3.5 - 5.0 g/dL 4.7     AST 15 - 41 U/L 35  32  28   ALT 0 - 44 U/L _0 Alk Phosphatase 38 - 126 U/L 60     Total Bilirubin 0.3 - 1.2 mg/dL 0.7  0.6  0.5        Latest Ref Rng & Units 07/20/2022   10:13 AM 05/13/2022   10:34 AM 02/03/2022    2:38 PM  CBC  WBC 4.0 - 10.5 K/uL 6.7  4.8  5.1   Hemoglobin 12.0 - 15.0 g/dL 13.9  14.3  14.0   Hematocrit 36.0 - 46.0 % 44.0  43.5  42.7   Platelets 150 - 400 K/uL 206  195  219    Current Medications, Allergies, Past Medical History, Past Surgical History, Family History and Social History were reviewed in [employer] record.   Physical Exam: General: Well developed, well nourished, no acute distress Head: Normocephalic and atraumatic Eyes: Sclerae anicteric, EOMI Ears: Normal auditory acuity Mouth: No deformities or lesions noted Lungs: Clear throughout to auscultation Heart: Regular rate and rhythm;  No murmurs, rubs or bruits Abdomen: Soft, non tender and non distended. No masses, hepatosplenomegaly or hernias noted. Normal Bowel sounds Rectal: Not done Musculoskeletal: Symmetrical with no gross deformities  Pulses:  Normal pulses noted Extremities: No edema or deformities noted Neurological: Alert oriented x 4, grossly nonfocal Psychological:  Alert and cooperative. Normal mood and affect   Oneisha Ammons T. Fuller Plan, MD 09/16/2022, 9:42 AM

## 2022-09-16 NOTE — Patient Instructions (Signed)
Continue current medications.   Please follow up with Dr. Fuller Plan in one year.   The [facility] providers would like to encourage you to use [facility] to communicate with providers for non-urgent requests or questions.  Due to long hold times on the telephone, sending your provider a message by [facility] may be a faster and more efficient way to get a response.  Please allow 48 business hours for a response.  Please remember that this is for non-urgent requests.   Thank you for choosing me and [facility].  Pricilla Riffle. Dagoberto Ligas., MD., [facility]

## 2022-09-17 ENCOUNTER — Ambulatory Visit
Admission: RE | Admit: 2022-09-17 | Discharge: 2022-09-17 | Disposition: A | Discharge status: Home / Self Care | Payer: PPO | Source: Ambulatory Visit | Attending: Internal Medicine | Admitting: Internal Medicine

## 2022-09-17 DIAGNOSIS — R1011 Right upper quadrant pain: Secondary | ICD-10-CM

## 2022-09-17 DIAGNOSIS — R112 Nausea with vomiting, unspecified: Secondary | ICD-10-CM | POA: Diagnosis not present

## 2022-09-17 DIAGNOSIS — K760 Fatty (change of) liver, not elsewhere classified: Secondary | ICD-10-CM | POA: Diagnosis not present

## 2022-09-18 NOTE — Progress Notes (Signed)
<><><><><><><><><><><><><><><><><><><><><><><><><><><><><><><><><> <><><><><><><><><><><><><><><><><><><><><><><><><><><><><><><><><>    Abdominal U/S returned Great !   - No sign of any internal cancers - GB, Kidneys, pancreas,  Liver, Spleen  <><><><><><><><><><><><><><><><><><><><><><><><><><><><><><><><><> <><><><><><><><><><><><><><><><><><><><><><><><><><><><><><><><><>

## 2022-10-04 ENCOUNTER — Other Ambulatory Visit (HOSPITAL_BASED_OUTPATIENT_CLINIC_OR_DEPARTMENT_OTHER): Payer: Self-pay

## 2022-10-06 DIAGNOSIS — E785 Hyperlipidemia, unspecified: Secondary | ICD-10-CM | POA: Insufficient documentation

## 2022-10-06 NOTE — Progress Notes (Signed)
Cardiology Office Note   Date:  10/08/2022   ID:  Evadna, Donaghy [birth year], MRN [id number]  PCP:  Unk Pinto, MD  Cardiologist:   Minus Breeding, MD   Chief Complaint  Patient presents with   Chest Pain    Under her breast around her ribs.      History of Present Illness: Shannon Parsons is a 82 y.o. female who is referred by the ED for evaluation of chest pain.  I saw her in 2021 for chest pain.  This was an isolated event and she had no cardiac work up.    She has no prior cardiac stress test other than in 2015 she apparently had mantle cell lymphoma and had an echo during the course of her chemotherapy and I saw the results of this.  This was normal.  2016 there was some chest discomfort and she went to [facility].  She had a negative Lexiscan Myoview.  She had subsequent follow-up echocardiography.  I also see that she had a CT scan in 2022 to rule out PE and it was no evidence of this.  I do note that there was again no mention of coronary calcium.  She presents because for about 2 weeks she has been getting constant chest discomfort.  It is under the left breast and into the left . radiates around her shoulder blade.  It is sharp.  It is constant though waxing and waning.  It can be 7 out of 10 in intensity.  It is not positional.  It does not with deep breathing.  She does not take anything to ease it.  She cannot bring it on with activities and she does do some activity such as vacuuming.  She has not had any change in her bowel habits or problems with foods.   Past Medical History:  Diagnosis Date   Adenomatous colon polyp 1994   Asthma    Cataract 2013   bilateral eyes   Diverticulosis    Family history of ischemic heart disease    Fibrocystic breast disease    GERD (gastroesophageal reflux disease)    Hiatal hernia    Hyperlipidemia    IBS (irritable bowel syndrome)    Internal hemorrhoids    Mantle cell lymphoma ([place]) 10/18/2014   Melanoma ([place])     Facial, pt denies on 01/16/18   OSA (obstructive sleep apnea)    Osteoarthritis    Osteoporosis    Prediabetes    Unspecified hypothyroidism    Vitamin D deficiency     Past Surgical History:  Procedure Laterality Date   APPENDECTOMY     BUNIONECTOMY     CATARACT EXTRACTION, BILATERAL     IR REMOVAL TUN ACCESS W/ PORT W/O FL MOD SED  03/04/2020   KNEE ARTHROSCOPY     TONSILLECTOMY     UMBILICAL HERNIA REPAIR     VIDEO BRONCHOSCOPY Bilateral 10/12/2017   Procedure: VIDEO BRONCHOSCOPY WITHOUT FLUORO;  Surgeon: Tanda Rockers, MD;  Location: [facility];  Service: Endoscopy;  Laterality: Bilateral;     Current Outpatient Medications  Medication Sig Dispense Refill   acetaminophen (TYLENOL) 325 MG tablet Take 650 mg by mouth at bedtime as needed for mild pain.     acidophilus (RISAQUAD) CAPS capsule Take 1 capsule by mouth daily.     Cholecalciferol (VITAMIN D3) 50 MCG (2000 UT) capsule Take 4,000 Units by mouth daily.      diazepam (VALIUM) 5 MG  tablet TAKE 1/2 TO 1 TABLET 3 TIMES A DAY FOR TREMOR     hyoscyamine (LEVSIN SL) 0.125 MG SL tablet Take    1 to 2 tablets     every 4 hours       as needed for Nausea, Cramping, Bloating or Diarrhea DIARRHEA 90 tablet 0   levothyroxine (SYNTHROID) 50 MCG tablet TAKE 1 TABLET DAILY ON AN EMPTY STOMACH WITH ONLY WATER FOR 30 MINUTES & NO ANTACID MEDS, CALCIUM OR MAGNESIUM FOR 4 HOURS & AVOID BIOTIN 90 tablet 3   Magnesium 250 MG TABS Take 1 tablet by mouth as needed.     omeprazole (PRILOSEC) 20 MG capsule Take 1 capsule (20 mg total) by mouth daily. 30 capsule 11   ondansetron (ZOFRAN-ODT) 8 MG disintegrating tablet Dissolve 1 tablet   tongue every 6 hours as needed for severe Nausea 20 tablet 0   predniSONE (DELTASONE) 10 MG tablet 1 tab 3 x day for 2 days, then 1 tab 2 x day for 2 days, then 1 tab 1 x day for 3 days 20 tablet 0   rosuvastatin (CRESTOR) 5 MG tablet TAKE 1/4 TABLET DAILY FOR CHOLESTEROL 23 tablet 1   traZODone (DESYREL) 150  MG tablet Take 1/2 to 1 tablet at bedtime 30 tablet 11   vitamin C (ASCORBIC ACID) 500 MG tablet Take 500 mg by mouth daily.     No current facility-administered medications for this visit.    Allergies:   Alendronate, Risedronate sodium, Sulfa antibiotics, Sulfasalazine, Other, and Tetracyclines & related    ROS:  Please see the history of present illness.   Otherwise, review of systems are positive for none.   All other systems are reviewed and negative.    PHYSICAL EXAM: VS:  BP 120/84 (BP Location: Right Arm, Patient Position: Sitting, Cuff Size: Normal)   Pulse 66   Ht '5\' 4"'$  (1.626 m)   Wt 106 lb (48.1 kg)   LMP 09/14/2014   BMI 18.19 kg/m  , BMI Body mass index is 18.19 kg/m. GENERAL:  Well appearing NECK:  No jugular venous distention, waveform within normal limits, carotid upstroke brisk and symmetric, no bruits, no thyromegaly LUNGS:  Clear to auscultation bilaterally CHEST:  Unremarkable HEART:  PMI not displaced or sustained,S1 and S2 within normal limits, no S3, no S4, no clicks, no rubs, no murmurs ABD:  Flat, positive bowel sounds normal in frequency in pitch, no bruits, no rebound, no guarding, no midline pulsatile mass, no hepatomegaly, no splenomegaly EXT:  2 plus pulses throughout, no edema, no cyanosis no clubbing    EKG:  EKG is ordered today. The ekg ordered today demonstrates sinus rhythm, rate 66, axis within normal limits, intervals within normal limits, no acute ST-T wave changes.   Recent Labs: 02/03/2022: TSH 1.50 07/20/2022: ALT 20; BUN 19; Creatinine 1.06; Hemoglobin 13.9; Magnesium 2.2; Platelet Count 206; Potassium 4.6; Sodium 138    Lipid Panel    Component Value Date/Time   CHOL 179 05/13/2022 1034   TRIG 89 05/13/2022 1034   HDL 66 05/13/2022 1034   CHOLHDL 2.7 05/13/2022 1034   VLDL 40 (H) 03/01/2017 1549   LDLCALC 95 05/13/2022 1034      Wt Readings from Last 3 Encounters:  10/08/22 106 lb (48.1 kg)  09/16/22 109 lb (49.4 kg)   09/08/22 109 lb 12.8 oz (49.8 kg)      Other studies Reviewed: Additional studies/ records that were reviewed today include: Previous radiology records Review of the  above records demonstrates:  Please see elsewhere in the note.     ASSESSMENT AND PLAN:  CHEST PAIN:    Her chest pain has nonanginal greater than anginal features.  I think the pretest probability obstructive coronary disease is somewhat low.   I will bring the patient back for a POET (Plain Old Exercise Test). This will allow me to screen for obstructive coronary disease, risk stratify and very importantly provide a prescription for exercise.  If this is negative I would suggest follow-up with GI  Current medicines are reviewed at length with the patient today.  The patient does not have concerns regarding medicines.  The following changes have been made:  None  Labs/ tests ordered today include: None  Orders Placed This Encounter  Procedures   EXERCISE TOLERANCE TEST (ETT)   EKG 12-Lead    Disposition:   FU with me as needed.     Signed, Minus Breeding, MD  10/08/2022 10:01 AM    [place]

## 2022-10-08 ENCOUNTER — Ambulatory Visit: Payer: PPO | Attending: Cardiology | Admitting: Cardiology

## 2022-10-08 ENCOUNTER — Encounter: Payer: Self-pay | Admitting: Cardiology

## 2022-10-08 VITALS — BP 120/84 | HR 66 | Ht 64.0 in | Wt 106.0 lb

## 2022-10-08 DIAGNOSIS — R072 Precordial pain: Secondary | ICD-10-CM | POA: Diagnosis not present

## 2022-10-08 DIAGNOSIS — E785 Hyperlipidemia, unspecified: Secondary | ICD-10-CM | POA: Diagnosis not present

## 2022-10-08 NOTE — Patient Instructions (Addendum)
Medication Instructions:  Your physician recommends that you continue on your current medications as directed. Please refer to the Current Medication list given to you today.  *If you need a refill on your cardiac medications before your next appointment, please call your pharmacy*   Lab Work: NONE If you have labs (blood work) drawn today and your tests are completely normal, you will receive your results only by: [place] (if you have MyChart) OR A paper copy in the mail If you have any lab test that is abnormal or we need to change your treatment, we will call you to review the results.   Testing/Procedures: Your physician has requested that you have an exercise tolerance test. For further information please visit [email or web address]. Please also follow instruction sheet, as given.    Follow-Up: At [facility], you and your health needs are our priority.  As part of our continuing mission to provide you with exceptional heart care, we have created designated Provider Care Teams.  These Care Teams include your primary Cardiologist (physician) and Advanced Practice Providers (APPs -  Physician Assistants and Nurse Practitioners) who all work together to provide you with the care you need, when you need it.  We recommend signing up for the patient portal called "MyChart".  Sign up information is provided on this After Visit Summary.  MyChart is used to connect with patients for Virtual Visits (Telemedicine).  Patients are able to view lab/test results, encounter notes, upcoming appointments, etc.  Non-urgent messages can be sent to your provider as well.   To learn more about what you can do with MyChart, go to [email or web address].    Your next appointment:   As Needed   Provider:   Minus Breeding, MD     Your physician has requested that you have an exercise tolerance test.  Please also follow instruction sheet, as given. This will take place at [address]  [address] Do not drink or eat foods with caffeine for 24 hours before the test. (Chocolate, coffee, tea, or energy drinks) If you use an inhaler, bring it with you to the test. Do not smoke for 4 hours before the test. Wear comfortable shoes and clothing.

## 2022-10-08 NOTE — Addendum Note (Signed)
Addended by: Linton Ham on: 10/08/2022 12:04 PM   Modules accepted: Orders

## 2022-10-16 NOTE — Addendum Note (Signed)
Addended by: Minus Breeding on: 10/16/2022 02:05 PM   Modules accepted: Orders

## 2022-10-21 ENCOUNTER — Ambulatory Visit: Payer: PPO | Attending: Cardiology

## 2022-10-21 DIAGNOSIS — R072 Precordial pain: Secondary | ICD-10-CM

## 2022-10-21 LAB — EXERCISE TOLERANCE TEST
Angina Index: 0
Duke Treadmill Score: 3
Estimated workload: 4.6
Exercise duration (min): 3 min
Exercise duration (sec): 0 s
MPHR: 141 {beats}/min
Peak HR: 155 {beats}/min
Percent HR: 109 %
RPE: 15
Rest HR: 71 {beats}/min
ST Depression (mm): 0 mm

## 2022-10-25 ENCOUNTER — Telehealth: Payer: Self-pay | Admitting: *Deleted

## 2022-10-25 DIAGNOSIS — R072 Precordial pain: Secondary | ICD-10-CM

## 2022-10-25 NOTE — Telephone Encounter (Signed)
-----   Message from Minus Breeding, MD sent at 10/23/2022  5:55 PM EST ----- This is a somewhat equivocal test.  There was lots of ectopy and poor exercise tolerance.  I would like to order a [employer].  I discussed it with her.  Please call to schedule PET

## 2022-11-09 ENCOUNTER — Telehealth: Payer: Self-pay

## 2022-11-09 NOTE — Progress Notes (Signed)
Reviewing patients chart prior to assessment call. Reviewing office visits, specialists visits, hospital visits, medication, adherence, and labs. Spoke with patient and she states that she is currently doing well and did not have any concerns about her health or medications. I advised her to call us if she does have any questions or concerns to arise.   Time Spent: 15 min.  [name], CTL

## 2022-11-12 ENCOUNTER — Other Ambulatory Visit: Payer: Self-pay

## 2022-11-12 ENCOUNTER — Encounter: Payer: Self-pay | Admitting: Nurse Practitioner

## 2022-11-12 ENCOUNTER — Ambulatory Visit: Payer: PPO | Admitting: Nurse Practitioner

## 2022-11-12 VITALS — HR 79 | Temp 97.2°F | Ht 64.0 in

## 2022-11-12 DIAGNOSIS — Z1152 Encounter for screening for COVID-19: Secondary | ICD-10-CM | POA: Diagnosis not present

## 2022-11-12 DIAGNOSIS — U071 COVID-19: Secondary | ICD-10-CM

## 2022-11-12 DIAGNOSIS — R6889 Other general symptoms and signs: Secondary | ICD-10-CM

## 2022-11-12 LAB — POC COVID19 BINAXNOW: SARS Coronavirus 2 Ag: POSITIVE — AB

## 2022-11-12 LAB — POCT INFLUENZA A/B
Influenza A, POC: NEGATIVE
Influenza B, POC: NEGATIVE

## 2022-11-12 MED ORDER — PROMETHAZINE-DM 6.25-15 MG/5ML PO SYRP: 5.0000 mL | ORAL_SOLUTION | Freq: Four times a day (QID) | ORAL | 1 refills | Status: DC | PRN

## 2022-11-12 MED ORDER — ALBUTEROL SULFATE HFA 108 (90 BASE) MCG/ACT IN AERS: 2.0000 | INHALATION_SPRAY | Freq: Four times a day (QID) | RESPIRATORY_TRACT | 0 refills | Status: DC | PRN

## 2022-11-12 MED ORDER — PREDNISONE 5 MG PO TABS: ORAL_TABLET | ORAL | 0 refills | Status: AC

## 2022-11-12 NOTE — Patient Instructions (Signed)
Covid Immue support reviewed Vit C, Vit D, Zinc Take tylenol PRN temp 101+ Push hydration Regular ambulation or calf exercises exercises for clot prevention and 81 mg ASA unless contraindicated Sx supportive therapy suggested Follow up via mychart or telephone if needed Advised patient obtain O2 monitor; present to ED if persistently <90% or with severe dyspnea, CP, fever uncontrolled by tylenol, confusion, sudden decline Should remain in isolation 5 days from testing positive and then wear a mask when around other people for the following 5 days

## 2022-11-12 NOTE — Progress Notes (Signed)
THIS ENCOUNTER IS A VIRTUAL VISIT DUE TO COVID-19 - PATIENT WAS NOT SEEN IN THE OFFICE.  PATIENT HAS CONSENTED TO VIRTUAL VISIT / TELEMEDICINE VISIT   Virtual Visit via telephone Note  I connected with  Shannon Parsons on 11/12/2022 by telephone.  I verified that I am speaking with the correct person using two identifiers.    I discussed the limitations of evaluation and management by telemedicine and the availability of in person appointments. The patient expressed understanding and agreed to proceed.  History of Present Illness:  Pulse 79   Temp (!) 97.2 F (36.2 C)   Ht '5\' 4"'$  (1.626 m)   LMP 09/14/2014   SpO2 95%   BMI 18.19 kg/m  82 y.o. patient contacted office reporting URI sx . she tested positive by test in office parking lot. OV was conducted by telephone to minimize exposure. This patient was vaccinated for covid 19, last 08/06/20 Immunization History  Administered Date(s) Administered   PFIZER(Purple Top)SARS-COV-2 Vaccination 10/29/2019, 11/19/2019, 08/06/2020   Pneumococcal Conjugate-13 03/18/2016   Pneumococcal Polysaccharide-23 06/18/2013   Zoster, Live 04/17/2013    Sx began 1 days ago with sore throat, fatigue, dry cough, fullness in ears, chest congestion  Treatments tried so far: unknown  Exposures: unknown   Medications  Current Outpatient Medications (Endocrine & Metabolic):    levothyroxine (SYNTHROID) 50 MCG tablet, TAKE 1 TABLET DAILY ON AN EMPTY STOMACH WITH ONLY WATER FOR 30 MINUTES & NO ANTACID MEDS, CALCIUM OR MAGNESIUM FOR 4 HOURS & AVOID BIOTIN   predniSONE (DELTASONE) 10 MG tablet, 1 tab 3 x day for 2 days, then 1 tab 2 x day for 2 days, then 1 tab 1 x day for 3 days (Patient not taking: Reported on 11/12/2022)  Current Outpatient Medications (Cardiovascular):    rosuvastatin (CRESTOR) 5 MG tablet, TAKE 1/4 TABLET DAILY FOR CHOLESTEROL  Current Outpatient Medications (Respiratory):    albuterol (VENTOLIN HFA) 108 (90 Base) MCG/ACT inhaler,    Current Outpatient Medications (Analgesics):    acetaminophen (TYLENOL) 325 MG tablet, Take 650 mg by mouth at bedtime as needed for mild pain.   Current Outpatient Medications (Other):    Cholecalciferol (VITAMIN D3) 50 MCG (2000 UT) capsule, Take 4,000 Units by mouth daily.    diazepam (VALIUM) 5 MG tablet, TAKE 1/2 TO 1 TABLET 3 TIMES A DAY FOR TREMOR   hydroquinone 4 % cream, Apply 1 a small amount to affected area twice a day   hyoscyamine (LEVSIN SL) 0.125 MG SL tablet, Take    1 to 2 tablets     every 4 hours       as needed for Nausea, Cramping, Bloating or Diarrhea DIARRHEA   Magnesium 250 MG TABS, Take 1 tablet by mouth as needed.   omeprazole (PRILOSEC) 20 MG capsule, Take 1 capsule (20 mg total) by mouth daily.   traZODone (DESYREL) 150 MG tablet, Take 1/2 to 1 tablet at bedtime   acidophilus (RISAQUAD) CAPS capsule, Take 1 capsule by mouth daily. (Patient not taking: Reported on 11/12/2022)   ondansetron (ZOFRAN-ODT) 8 MG disintegrating tablet, Dissolve 1 tablet   tongue every 6 hours as needed for severe Nausea (Patient not taking: Reported on 11/12/2022)   vitamin C (ASCORBIC ACID) 500 MG tablet, Take 500 mg by mouth daily. (Patient not taking: Reported on 11/12/2022)  Allergies:  Allergies  Allergen Reactions   Alendronate Other (See Comments)    "made me choke"     Risedronate Sodium Other (See Comments)    "  made me choke"     Sulfa Antibiotics Other (See Comments)    Doesn't remember    Sulfasalazine Other (See Comments)    Doesn't remember    Other Rash   Tetracyclines & Related Rash    Problem list She has Chronic obstructive airway disease with asthma; History of colonic polyps; Hyperlipidemia, mixed; Hypotension; GERD; Osteoporosis  (Refuses treatment); Osteoarthritis; Vitamin D deficiency; IBS; Diverticulosis; Anxiety state; OSA and COPD overlap syndrome ([place]); Medication management; Mantle cell lymphoma ([place]); Normocytic anemia; History of DVT (deep vein  thrombosis); Body mass index (BMI) of 20.0-20.9 in adult; Obstructive bronchiectasis ([place]); Acquired hypogammaglobulinemia ([place]); Atherosclerosis of aorta ([place]) by PET Scan 04/21/2018.; Sinusitis, maxillary, chronic; Cervical dystonia; Tremor; Hypothyroidism; Abnormal glucose; Labile hypertension; Bronchiectasis with acute lower respiratory infection ([place]); OSA (obstructive sleep apnea); Moderate persistent asthma with status asthmaticus; Chemotherapy-induced lung disease; Psychophysiological insomnia; and Dyslipidemia on their problem list.   Social History:   reports that she has never smoked. She has never used smokeless tobacco. She reports that she does not drink alcohol and does not use drugs.  Observations/Objective:  General : Well sounding patient in no apparent distress HEENT: no hoarseness, no cough for duration of visit Lungs: speaks in complete sentences, no audible wheezing, no apparent distress Neurological: alert, oriented x 3 Psychiatric: pleasant, judgement appropriate   Assessment and Plan:  Covid 19 Covid 19 positive per rapid screening test in office parking lot Risk factors include: has Chronic obstructive airway disease with asthma; History of colonic polyps; Hyperlipidemia, mixed; Hypotension; GERD; Osteoporosis  (Refuses treatment); Osteoarthritis; Vitamin D deficiency; IBS; Diverticulosis; Anxiety state; OSA and COPD overlap syndrome ([place]); Medication management; Mantle cell lymphoma ([place]); Normocytic anemia; History of DVT (deep vein thrombosis); Body mass index (BMI) of 20.0-20.9 in adult; Obstructive bronchiectasis ([place]); Acquired hypogammaglobulinemia ([place]); Atherosclerosis of aorta ([place]) by PET Scan 04/21/2018.; Sinusitis, maxillary, chronic; Cervical dystonia; Tremor; Hypothyroidism; Abnormal glucose; Labile hypertension; Bronchiectasis with acute lower respiratory infection ([place]); OSA (obstructive sleep apnea); Moderate persistent asthma with status asthmaticus;  Chemotherapy-induced lung disease; Psychophysiological insomnia; and Dyslipidemia on their problem list.  Symptoms are: mild Due to co morbid conditions and risk factors, discussed antivirals , did not want to take Immue support reviewed Vit C, Vit D, Zinc Take tylenol PRN temp 101+ Push hydration Regular ambulation or calf exercises exercises for clot prevention and 81 mg ASA unless contraindicated Sx supportive therapy suggested Follow up via mychart or telephone if needed Advised patient obtain O2 monitor; present to ED if persistently <90% or with severe dyspnea, CP, fever uncontrolled by tylenol, confusion, sudden decline Should remain in isolation 5 days from testing positive and then wear a mask when around other people for the following 5 days S[place]ona was seen today for acute visit.  Diagnoses and all orders for this visit:  Encounter for screening for COVID-19 -     POC COVID-19- positive  Flu-like symptoms -     POCT Influenza A/B- negative  COVID -     albuterol (VENTOLIN HFA) 108 (90 Base) MCG/ACT inhaler; Inhale 2 puffs into the lungs every 6 (six) hours as needed for wheezing or shortness of breath. -     predniSONE (DELTASONE) 5 MG tablet; 3 tablets daily with food for 2 days, 2 tabs daily for 2 days, 1 tab a day for 3 days. -     promethazine-dextromethorphan (PROMETHAZINE-DM) 6.25-15 MG/5ML syrup; Take 5 mLs by mouth 4 (four) times daily as needed for cough.     Follow Up  Instructions:  I discussed the assessment and treatment plan with the patient. The patient was provided an opportunity to ask questions and all were answered. The patient agreed with the plan and demonstrated an understanding of the instructions.   The patient was advised to call back or seek an in-person evaluation if the symptoms worsen or if the condition fails to improve as anticipated.  I provided 20 minutes of non-face-to-face time during this encounter.   Alycia Rossetti, NP

## 2022-11-17 ENCOUNTER — Inpatient Hospital Stay (HOSPITAL_COMMUNITY): Admission: RE | Admit: 2022-11-17 | Payer: PPO | Source: Ambulatory Visit

## 2022-11-17 ENCOUNTER — Telehealth: Payer: Self-pay

## 2022-11-17 NOTE — Telephone Encounter (Signed)
Received phone call from patient stating she "is barely over 100 pounds recently" and wondering if Dr. Marin Olp wanted to see her sooner. This RN reviewed chart noting that patient was 111 pounds in Novemeber 2023 and 106 pounds at her cardiology appointment in January 2024. Patient educated that with her history of IBS she may need to follow up with her GI MD or her PCP as she has not been on active chemotherapy for over 5 years. Pt stated she has an appt with her PCP next week and will discuss concerns at that appointment. Pt educated that if she feels she needs to be seen by our office we would certainly see her sooner and this RN offered to transfer her to schedule which patient declined. Pt also educated that if PCP recommends her to be evaluated by this office we would certainly schedule her sooner. Pt verbalized understanding and had no further questions.

## 2022-11-18 ENCOUNTER — Telehealth: Payer: Self-pay | Admitting: Nurse Practitioner

## 2022-11-18 NOTE — Telephone Encounter (Signed)
Pt is not feeling better since being tested for COVID on 03/01, wanted to know if something else can be sent in for her?

## 2022-11-18 NOTE — Telephone Encounter (Signed)
Yes please

## 2022-11-18 NOTE — Telephone Encounter (Signed)
What symptoms are you still having?

## 2022-11-22 ENCOUNTER — Encounter: Payer: Self-pay | Admitting: Internal Medicine

## 2022-11-22 NOTE — Progress Notes (Unsigned)
Annual Screening/Preventative Visit & Comprehensive Evaluation &  Examination   Future Appointments  Date Time Provider Department  11/23/2022 11:00 AM Unk Pinto, MD [facility]  01/18/2023 10:30 AM Volanda Napoleon, MD [facility]  07/04/2023 11:00 AM Debbora Presto, NP [facility]  11/25/2023 11:00 AM Unk Pinto, MD [facility]          This very nice 82 y.o. MWF with labile HTN,  HLD, Hypothyroidism,  Prediabetes,   Essential Hereditary Tremor, OSA/CPAP  and Vitamin D Deficiency  who presents for a Screening /Preventative Visit & comprehensive evaluation and management of multiple medical co-morbidities.   PET scan in 05/2018 showed Aortic Atherosclerosis.  Patient is followed by Dr Brett Fairy  for  OSA on CPA. P  Patient also is followed by Dr Melvyn Novas for hx/o Bronchiectasis . Patient has hx/o Mantle Cell Lymphoma (2016) being followed by Dr Marin Olp.          Labile HTN predates 15-20 years .   Patient's BP has been controlled at home and patient denies any cardiac symptoms as chest pain, palpitations, shortness of breath, dizziness or ankle swelling. Today's BP was                           .       Patient's hyperlipidemia is controlled with diet and Rosuvastatin. Patient denies myalgias or other medication SE's. Last lipids were near goal :  Lab Results  Component Value Date   CHOL 187 05/13/2021   HDL 67 05/13/2021   LDLCALC 102 (H) 05/13/2021   TRIG 90 05/13/2021   CHOLHDL 2.8 05/13/2021         Patient has hx/o prediabetes (A1c 5.9% /2012) and patient denies reactive hypoglycemic symptoms, visual blurring, diabetic polys or paresthesias. Last A1c was normal & at goal :  Lab Results  Component Value Date   HGBA1C 5.5 05/13/2021        In 2012 , patient was started on replacement meds for her Hypothyroidism.        Finally, patient has history of Vitamin D Deficiency ("35" / 2016) and last Vitamin D was slightly low  (goal 70-100) :  Lab Results  Component Value Date    VD25OH 54 05/13/2021     Current Outpatient Medications on File Prior to Visit  Medication Sig   acetaminophen (TYLENOL) 325 MG tablet Take 650 mg  at bedtime as needed for mild pain.   acidophilus (RISAQUAD) CAPS capsule Take 1 capsule daily.   VITAMIN D 2000 UT) capsule Take 4,000 Units  daily.    hyoscyamine SL) 0.125 MG S Take 1 to 2 tablets every 4 hours    levothyroxine 50 MCG tablet Take  1 tablet  Daily     Magnesium 250 MG TABS Take 1 tablet daily.   ondansetron -ODT) 4 MG  Take 1 tablet every 8  hours as needed    rosuvastatin  5 MG tablet TAKE 1/4 TABLET DAILY   traZODone 150 MG tablet Take  1/3  to 1/2 - 2/3 tablet  1 hr  before Bedtime for Sleep   vitamin C 500 MG tablet Take 5 daily.     Allergies  Allergen Reactions   Alendronate Other (See Comments)    "made me choke"   Risedronate Sodium Other (See Comments)    "made me choke"    Sulfa Antibiotics Other (See Comments)    Doesn't remember    Sulfasalazine Other (See  Comments)    Doesn't remember    Other Rash   Tetracyclines & Related Rash     Past Medical History:  Diagnosis Date   Adenomatous colon polyp 1994   Asthma    Cataract 2013   bilateral eyes   Diverticulosis    Family history of ischemic heart disease    Fibrocystic breast disease    GERD (gastroesophageal reflux disease)    Hiatal hernia    Hyperlipidemia    IBS (irritable bowel syndrome)    Internal hemorrhoids    Mantle cell lymphoma ([place]) 10/18/2014   Melanoma ([place])    Facial, pt denies on 01/16/18   OSA (obstructive sleep apnea)    Osteoarthritis    Osteoporosis    Prediabetes    hypothyroidism    Vitamin D deficiency      Health Maintenance  Topic Date Due   Zoster Vaccines- Shingrix (1 of 2) Never done   COVID-19 Vaccine (4 - Booster for Pfizer series) 10/01/2020   TETANUS/TDAP  05/13/2022 (Originally 08/17/1960)   Pneumonia Vaccine 73+ Years old  Completed   DEXA SCAN  Completed   Hepatitis C Screening  Completed    HPV VACCINES  Aged Out   INFLUENZA VACCINE  Discontinued     Immunization History  Administered Date(s) Administered   PFIZER SARS-COV-2 Vacc 10/29/2019, 11/19/2019, 08/06/2020   Pneumococcal - 13 03/18/2016   Pneumococcal - 23 06/18/2013   Zoster, Live 04/17/2013    Last Colon - Last Colon - 02/02/2017 - Dr Fuller Plan - Recc 5 yr f/u due May 2023   Last MGM - 01/02/2021   Past Surgical History:  Procedure Laterality Date   APPENDECTOMY     BUNIONECTOMY     CATARACT EXTRACTION, BILATERAL     IR REMOVAL TUN ACCESS W/ PORT W/O FL MOD SED  03/04/2020   KNEE ARTHROSCOPY     TONSILLECTOMY     UMBILICAL HERNIA REPAIR     VIDEO BRONCHOSCOPY Bilateral 10/12/2017   VIDEO BRONCHOSCOPY WITHOUT FLUORO;  Tanda Rockers, [facility]     Family History  Problem Relation Age of Onset   Emphysema Father    Heart disease Father    Heart attack Father 24       Died   Heart disease Brother 19   Rheum arthritis Sister    Hypertension Mother    Thyroid disease Mother    Colon cancer Neg Hx    Stomach cancer Neg Hx      Social History   Tobacco Use   Smoking status: Never   Smokeless tobacco: Never   Tobacco comments:    Never Used Tobacco  Vaping Use   Vaping Use: Never used  Substance Use Topics   Alcohol use: No    Alcohol/week: 0.0 standard drinks   Drug use: No      ROS Constitutional: Denies fever, chills, weight loss/gain, headaches, insomnia,  night sweats, and change in appetite. Does c/o fatigue. Eyes: Denies redness, blurred vision, diplopia, discharge, itchy, watery eyes.  ENT: Denies discharge, congestion, post nasal drip, epistaxis, sore throat, earache, hearing loss, dental pain, Tinnitus, Vertigo, Sinus pain, snoring.  Cardio: Denies chest pain, palpitations, irregular heartbeat, syncope, dyspnea, diaphoresis, orthopnea, PND, claudication, edema Respiratory: denies cough, dyspnea, DOE, pleurisy, hoarseness, laryngitis, wheezing.  Gastrointestinal: Denies  dysphagia, heartburn, reflux, water brash, pain, cramps, nausea, vomiting, bloating, diarrhea, constipation, hematemesis, melena, hematochezia, jaundice, hemorrhoids Genitourinary: Denies dysuria, frequency, urgency, nocturia, hesitancy, discharge, hematuria, flank pain Breast: Breast lumps, nipple  discharge, bleeding.  Musculoskeletal: Denies arthralgia, myalgia, stiffness, Jt. Swelling, pain, limp, and strain/sprain. Denies falls. Skin: Denies puritis, rash, hives, warts, acne, eczema, changing in skin lesion Neuro: No weakness, tremor, incoordination, spasms, paresthesia, pain Psychiatric: Denies confusion, memory loss, sensory loss. Denies Depression. Endocrine: Denies change in weight, skin, hair change, nocturia, and paresthesia, diabetic polys, visual blurring, hyper / hypo glycemic episodes.  Heme/Lymph: No excessive bleeding, bruising, enlarged lymph nodes.  Physical Exam  LMP 09/14/2014   General Appearance: Well nourished, well groomed and in no apparent distress.  Eyes: PERRLA, EOMs, conjunctiva no swelling or erythema, normal fundi and vessels. Sinuses: No frontal/maxillary tenderness ENT/Mouth: EACs patent / TMs  nl. Nares clear without erythema, swelling, mucoid exudates. Oral hygiene is good. No erythema, swelling, or exudate. Tongue normal, non-obstructing. Tonsils not swollen or erythematous. Hearing normal.  Neck: Supple, thyroid not palpable. No bruits, nodes or JVD. Respiratory: Respiratory effort normal.  BS equal and clear bilateral without rales, rhonci, wheezing or stridor. Cardio: Heart sounds are normal with regular rate and rhythm and no murmurs, rubs or gallops. Peripheral pulses are normal and equal bilaterally without edema. No aortic or femoral bruits. Chest: symmetric with normal excursions and percussion. Breasts: Symmetric, without lumps, nipple discharge, retractions, or fibrocystic changes.  Abdomen: Flat, soft with bowel sounds active. Nontender, no  guarding, rebound, hernias, masses, or organomegaly.  Lymphatics: Non tender without lymphadenopathy.  Musculoskeletal: Full ROM all peripheral extremities, joint stability, 5/5 strength, and normal gait. Skin: Warm and dry without rashes, lesions, cyanosis, clubbing or  ecchymosis.  Neuro: Cranial nerves intact, reflexes equal bilaterally. Normal muscle tone, no cerebellar symptoms. Sensation intact. Fine high frequency action tremor of hands & titubation of head.  Pysch: Alert and oriented X 3, normal affect, Insight and Judgment appropriate.    1. Labile hypertension  - EKG 12-Lead - Urinalysis, Routine w reflex microscopic - Microalbumin / creatinine urine ratio - CBC with Differential/Platelet - COMPLETE METABOLIC PANEL WITH GFR - TSH  2. Hyperlipidemia, mixed  - EKG 12-Lead - Magnesium  3. Abnormal glucose  - EKG 12-Lead - Hemoglobin A1c - Insulin, random  4. Vitamin D deficiency   5. Hypothyroidism  - TSH  6. Atherosclerosis of aorta ([place]) by PET Scan 04/21/2018.  - EKG 12-Lead  7. Hereditary essential tremor   8. Mantle cell lymphoma([facility])   9. Screening for colorectal cancer  - POC Hemoccult Bld/Stl   10. Screening for heart disease  - EKG 12-Lead  11. FHx: heart disease  - EKG 12-Lead  12. OSA and COPD overlap syndrome ([facility])  - EKG 12-Lead  13. Medication managent   - Urinalysis, Routine w reflex microscopic - Microalbumin / creatinine urine ratio - RPR - CBC with Differential/Platelet - COMPLETE METABOLIC PANEL WITH GFR - Magnesium - Lipid panel - TSH - Hemoglobin A1c - Insulin, random - VITAMIN D 25 Hydroxy          Patient was counseled in prudent diet to achieve/maintain BMI less than 25 for weight control, BP monitoring, regular exercise and medications. Discussed med's effects and SE's. Screening labs and tests as requested with regular follow-up as recommended. Over 40 minutes of exam, counseling, chart review and high complex  critical decision making was performed.   Kirtland Bouchard, MD

## 2022-11-22 NOTE — Patient Instructions (Addendum)

## 2022-11-23 ENCOUNTER — Encounter: Payer: PPO | Admitting: Internal Medicine

## 2022-11-23 ENCOUNTER — Ambulatory Visit (INDEPENDENT_AMBULATORY_CARE_PROVIDER_SITE_OTHER): Payer: PPO | Admitting: Internal Medicine

## 2022-11-23 ENCOUNTER — Encounter: Payer: Self-pay | Admitting: Internal Medicine

## 2022-11-23 VITALS — BP 124/80 | HR 76 | Temp 97.9°F | Resp 17 | Ht 64.0 in | Wt 102.6 lb

## 2022-11-23 DIAGNOSIS — Z79899 Other long term (current) drug therapy: Secondary | ICD-10-CM

## 2022-11-23 DIAGNOSIS — R7309 Other abnormal glucose: Secondary | ICD-10-CM | POA: Diagnosis not present

## 2022-11-23 DIAGNOSIS — R0989 Other specified symptoms and signs involving the circulatory and respiratory systems: Secondary | ICD-10-CM

## 2022-11-23 DIAGNOSIS — Z0000 Encounter for general adult medical examination without abnormal findings: Secondary | ICD-10-CM | POA: Diagnosis not present

## 2022-11-23 DIAGNOSIS — I700 Atherosclerosis of aorta: Secondary | ICD-10-CM

## 2022-11-23 DIAGNOSIS — E559 Vitamin D deficiency, unspecified: Secondary | ICD-10-CM | POA: Diagnosis not present

## 2022-11-23 DIAGNOSIS — C8310 Mantle cell lymphoma, unspecified site: Secondary | ICD-10-CM | POA: Diagnosis not present

## 2022-11-23 DIAGNOSIS — G4733 Obstructive sleep apnea (adult) (pediatric): Secondary | ICD-10-CM

## 2022-11-23 DIAGNOSIS — J4489 Other specified chronic obstructive pulmonary disease: Secondary | ICD-10-CM

## 2022-11-23 DIAGNOSIS — E782 Mixed hyperlipidemia: Secondary | ICD-10-CM | POA: Diagnosis not present

## 2022-11-23 DIAGNOSIS — E039 Hypothyroidism, unspecified: Secondary | ICD-10-CM

## 2022-11-23 DIAGNOSIS — Z1211 Encounter for screening for malignant neoplasm of colon: Secondary | ICD-10-CM

## 2022-11-23 DIAGNOSIS — Z136 Encounter for screening for cardiovascular disorders: Secondary | ICD-10-CM

## 2022-11-23 DIAGNOSIS — G250 Essential tremor: Secondary | ICD-10-CM

## 2022-11-23 DIAGNOSIS — Z0001 Encounter for general adult medical examination with abnormal findings: Secondary | ICD-10-CM

## 2022-11-24 LAB — MICROALBUMIN / CREATININE URINE RATIO
Creatinine, Urine: 78 mg/dL (ref 20–275)
Microalb Creat Ratio: 67 mcg/mg creat — ABNORMAL HIGH (ref <30)
Microalb, Ur: 5.2 mg/dL

## 2022-11-24 LAB — URINALYSIS, ROUTINE W REFLEX MICROSCOPIC
Bilirubin Urine: NEGATIVE
Glucose, UA: NEGATIVE
Hgb urine dipstick: NEGATIVE
Ketones, ur: NEGATIVE
Leukocytes,Ua: NEGATIVE
Nitrite: NEGATIVE
Protein, ur: NEGATIVE
Specific Gravity, Urine: 1.009 (ref 1.001–1.035)
pH: 6.5 (ref 5.0–8.0)

## 2022-11-24 LAB — COMPLETE METABOLIC PANEL WITH GFR
AG Ratio: 2.6 (calc) — ABNORMAL HIGH (ref 1.0–2.5)
ALT: 15 U/L (ref 6–29)
AST: 25 U/L (ref 10–35)
Albumin: 4.1 g/dL (ref 3.6–5.1)
Alkaline phosphatase (APISO): 57 U/L (ref 37–153)
BUN/Creatinine Ratio: 16 (calc) (ref 6–22)
BUN: 18 mg/dL (ref 7–25)
CO2: 29 mmol/L (ref 20–32)
Calcium: 9.7 mg/dL (ref 8.6–10.4)
Chloride: 103 mmol/L (ref 98–110)
Creat: 1.16 mg/dL — ABNORMAL HIGH (ref 0.60–1.00)
Globulin: 1.6 g/dL (calc) — ABNORMAL LOW (ref 1.9–3.7)
Glucose, Bld: 88 mg/dL (ref 65–99)
Potassium: 4.5 mmol/L (ref 3.5–5.3)
Sodium: 141 mmol/L (ref 135–146)
Total Bilirubin: 0.6 mg/dL (ref 0.2–1.2)
Total Protein: 5.7 g/dL — ABNORMAL LOW (ref 6.1–8.1)
eGFR: 48 mL/min/{1.73_m2} — ABNORMAL LOW (ref >=60)

## 2022-11-24 LAB — CBC WITH DIFFERENTIAL/PLATELET
Absolute Monocytes: 833 cells/uL (ref 200–950)
Basophils Absolute: 18 cells/uL (ref 0–200)
Basophils Relative: 0.4 %
Eosinophils Absolute: 92 cells/uL (ref 15–500)
Eosinophils Relative: 2 %
HCT: 42.6 % (ref 35.0–45.0)
Hemoglobin: 13.9 g/dL (ref 11.7–15.5)
Lymphs Abs: 1003 cells/uL (ref 850–3900)
MCH: 28.6 pg (ref 27.0–33.0)
MCHC: 32.6 g/dL (ref 32.0–36.0)
MCV: 87.7 fL (ref 80.0–100.0)
MPV: 10.4 fL (ref 7.5–12.5)
Monocytes Relative: 18.1 %
Neutro Abs: 2654 cells/uL (ref 1500–7800)
Neutrophils Relative %: 57.7 %
Platelets: 189 10*3/uL (ref 140–400)
RBC: 4.86 10*6/uL (ref 3.80–5.10)
RDW: 13.1 % (ref 11.0–15.0)
Total Lymphocyte: 21.8 %
WBC: 4.6 10*3/uL (ref 3.8–10.8)

## 2022-11-24 LAB — LIPID PANEL
Cholesterol: 166 mg/dL (ref <200)
HDL: 45 mg/dL — ABNORMAL LOW (ref >=50)
LDL Cholesterol (Calc): 97 mg/dL (calc)
Non-HDL Cholesterol (Calc): 121 mg/dL (calc) (ref <130)
Total CHOL/HDL Ratio: 3.7 (calc) (ref <5.0)
Triglycerides: 143 mg/dL (ref <150)

## 2022-11-24 LAB — INSULIN, RANDOM: Insulin: 9.7 u[IU]/mL

## 2022-11-24 LAB — HEMOGLOBIN A1C
Hgb A1c MFr Bld: 5.9 % of total Hgb — ABNORMAL HIGH (ref <5.7)
Mean Plasma Glucose: 123 mg/dL
eAG (mmol/L): 6.8 mmol/L

## 2022-11-24 LAB — VITAMIN D 25 HYDROXY (VIT D DEFICIENCY, FRACTURES): Vit D, 25-Hydroxy: 73 ng/mL (ref 30–100)

## 2022-11-24 LAB — MAGNESIUM: Magnesium: 2 mg/dL (ref 1.5–2.5)

## 2022-11-24 LAB — TSH: TSH: 1.92 mIU/L (ref 0.40–4.50)

## 2022-11-24 NOTE — Progress Notes (Signed)
<><><><><><><><><><><><><><><><><><><><><><><><><><><><><><><><><> <><><><><><><><><><><><><><><><><><><><><><><><><><><><><><><><><> -   Test results slightly outside the reference range are not unusual. If there is anything important, I will review this with you,  otherwise it is considered normal test values.  If you have further questions,  please do not hesitate to contact me at the office or via My Chart.  <><><><><><><><><><><><><><><><><><><><><><><><><><><><><><><><><> <><><><><><><><><><><><><><><><><><><><><><><><><><><><><><><><><>   - Kidney functions  look a little dehydrated    Very important to drink adequate amounts of fluids to prevent permanent damage    - Recommend drink at least 6 bottles (16 ounces) of fluids /water /day = 96 Oz ~100 oz  - 100 oz = 3,000 cc or 3 liters / day  - >> That's 1 &1/2 bottles of a 2 liter soda bottle /day !  <><><><><><><><><><><><><><><><><><><><><><><><><><><><><><><><><> <><><><><><><><><><><><><><><><><><><><><><><><><><><><><><><><><>  - Chol = 166 and LDL = 97  - both excellent <><><><><><><><><><><><><><><><><><><><><><><><><><><><><><><><><> <><><><><><><><><><><><><><><><><><><><><><><><><><><><><><><><><>  - A1c = 5.9   - sl elevated blood sugar - So    - Avoid Sweets, Candy & White Stuff   - White Rice, White [place], White Flour  - Breads &  Pasta  <><><><><><><><><><><><><><><><><><><><><><><><><><><><><><><><><> <><><><><><><><><><><><><><><><><><><><><><><><><><><><><><><><><>  - Vit D = 73  - Excellent - keep  dose same   <><><><><><><><><><><><><><><><><><><><><><><><><><><><><><><><><> <><><><><><><><><><><><><><><><><><><><><><><><><><><><><><><><><>  All Else - CBC - Kidneys - Electrolytes - Liver - Magnesium & Thyroid    - all  Normal / OK <><><><><><><><><><><><><><><><><><><><><><><><><><><><><><><><><> <><><><><><><><><><><><><><><><><><><><><><><><><><><><><><><><><>

## 2022-11-25 ENCOUNTER — Encounter: Payer: PPO | Admitting: Internal Medicine

## 2022-11-26 ENCOUNTER — Telehealth (HOSPITAL_COMMUNITY): Payer: Self-pay | Admitting: Cardiology

## 2022-11-26 NOTE — Telephone Encounter (Signed)
Patient called and cancelled [facility] PET CT for reason below:  11/25/2022 8:10 AM [id number], YVETTE  Cancel Rsn: Patient (patient states she is getting over [place] and is not up for this test at this time, will callback to r/s)  Order will be removed from the Active Wq and if patient calls back to schedule we will reinstate the order. Thank you

## 2022-11-26 NOTE — Telephone Encounter (Signed)
Left message with husband. if patient needs anything further please let us know and I will send message to provider.

## 2022-11-29 ENCOUNTER — Encounter: Payer: Self-pay | Admitting: Internal Medicine

## 2022-12-01 ENCOUNTER — Other Ambulatory Visit (HOSPITAL_COMMUNITY): Payer: PPO

## 2022-12-08 ENCOUNTER — Other Ambulatory Visit: Payer: Self-pay

## 2022-12-08 DIAGNOSIS — Z1211 Encounter for screening for malignant neoplasm of colon: Secondary | ICD-10-CM

## 2022-12-08 LAB — POC HEMOCCULT BLD/STL (HOME/3-CARD/SCREEN)
Card #2 Fecal Occult Blod, POC: NEGATIVE
Card #3 Fecal Occult Blood, POC: NEGATIVE
Fecal Occult Blood, POC: NEGATIVE

## 2022-12-09 DIAGNOSIS — Z1212 Encounter for screening for malignant neoplasm of rectum: Secondary | ICD-10-CM | POA: Diagnosis not present

## 2022-12-09 DIAGNOSIS — Z1211 Encounter for screening for malignant neoplasm of colon: Secondary | ICD-10-CM | POA: Diagnosis not present

## 2023-01-04 ENCOUNTER — Other Ambulatory Visit: Payer: Self-pay

## 2023-01-04 ENCOUNTER — Encounter: Payer: Self-pay | Admitting: Hematology & Oncology

## 2023-01-04 ENCOUNTER — Inpatient Hospital Stay (HOSPITAL_BASED_OUTPATIENT_CLINIC_OR_DEPARTMENT_OTHER): Payer: PPO | Admitting: Hematology & Oncology

## 2023-01-04 ENCOUNTER — Inpatient Hospital Stay: Payer: PPO | Attending: Hematology & Oncology

## 2023-01-04 VITALS — BP 163/69 | HR 79 | Temp 97.7°F | Resp 18 | Ht 64.0 in | Wt 102.0 lb

## 2023-01-04 DIAGNOSIS — F0390 Unspecified dementia without behavioral disturbance: Secondary | ICD-10-CM | POA: Insufficient documentation

## 2023-01-04 DIAGNOSIS — D801 Nonfamilial hypogammaglobulinemia: Secondary | ICD-10-CM | POA: Diagnosis not present

## 2023-01-04 DIAGNOSIS — C8310 Mantle cell lymphoma, unspecified site: Secondary | ICD-10-CM | POA: Diagnosis not present

## 2023-01-04 DIAGNOSIS — J479 Bronchiectasis, uncomplicated: Secondary | ICD-10-CM | POA: Diagnosis not present

## 2023-01-04 DIAGNOSIS — C8311 Mantle cell lymphoma, lymph nodes of head, face, and neck: Secondary | ICD-10-CM

## 2023-01-04 DIAGNOSIS — K638219 Small intestinal bacterial overgrowth, unspecified: Secondary | ICD-10-CM | POA: Insufficient documentation

## 2023-01-04 DIAGNOSIS — C8312 Mantle cell lymphoma, intrathoracic lymph nodes: Secondary | ICD-10-CM

## 2023-01-04 DIAGNOSIS — K589 Irritable bowel syndrome without diarrhea: Secondary | ICD-10-CM | POA: Insufficient documentation

## 2023-01-04 LAB — MAGNESIUM: Magnesium: 2 mg/dL (ref 1.7–2.4)

## 2023-01-04 LAB — CMP (CANCER CENTER ONLY)
ALT: 17 U/L (ref 0–44)
AST: 32 U/L (ref 15–41)
Albumin: 4.2 g/dL (ref 3.5–5.0)
Alkaline Phosphatase: 58 U/L (ref 38–126)
Anion gap: 9 (ref 5–15)
BUN: 25 mg/dL — ABNORMAL HIGH (ref 8–23)
CO2: 28 mmol/L (ref 22–32)
Calcium: 9.8 mg/dL (ref 8.9–10.3)
Chloride: 103 mmol/L (ref 98–111)
Creatinine: 1.09 mg/dL — ABNORMAL HIGH (ref 0.44–1.00)
GFR, Estimated: 52 mL/min — ABNORMAL LOW (ref >60)
Glucose, Bld: 108 mg/dL — ABNORMAL HIGH (ref 70–99)
Potassium: 4.8 mmol/L (ref 3.5–5.1)
Sodium: 140 mmol/L (ref 135–145)
Total Bilirubin: 0.5 mg/dL (ref 0.3–1.2)
Total Protein: 6.3 g/dL — ABNORMAL LOW (ref 6.5–8.1)

## 2023-01-04 LAB — CBC WITH DIFFERENTIAL (CANCER CENTER ONLY)
Abs Immature Granulocytes: 0.01 10*3/uL (ref 0.00–0.07)
Basophils Absolute: 0.1 10*3/uL (ref 0.0–0.1)
Basophils Relative: 2 %
Eosinophils Absolute: 0.4 10*3/uL (ref 0.0–0.5)
Eosinophils Relative: 8 %
HCT: 43.5 % (ref 36.0–46.0)
Hemoglobin: 13.7 g/dL (ref 12.0–15.0)
Immature Granulocytes: 0 %
Lymphocytes Relative: 20 %
Lymphs Abs: 1.1 10*3/uL (ref 0.7–4.0)
MCH: 31.4 pg (ref 26.0–34.0)
MCHC: 31.5 g/dL (ref 30.0–36.0)
MCV: 99.5 fL (ref 80.0–100.0)
Monocytes Absolute: 0.9 10*3/uL (ref 0.1–1.0)
Monocytes Relative: 16 %
Neutro Abs: 3 10*3/uL (ref 1.7–7.7)
Neutrophils Relative %: 54 %
Platelet Count: 205 10*3/uL (ref 150–400)
RBC: 4.37 MIL/uL (ref 3.87–5.11)
RDW: 13.9 % (ref 11.5–15.5)
WBC Count: 5.5 10*3/uL (ref 4.0–10.5)
nRBC: 0 % (ref 0.0–0.2)

## 2023-01-04 LAB — LACTATE DEHYDROGENASE: LDH: 186 U/L (ref 98–192)

## 2023-01-04 NOTE — Progress Notes (Signed)
Hematology and Oncology Follow Up Visit  Shannon Parsons [id number] [birth year] 82 y.o. 01/04/2023   Principle Diagnosis:  Mantle cell lymphoma (MCIPI = 5) Thromboembolic event of the right leg Acquired Hypogammaglobulinemia Bronchiectasis  Current Therapy:   Rituxan - maintanence - s/p cycle #10 - to finish in 07/2017 IVIG - q  month -restart on 10/27/2017; last dose 01/05/2018     Interim History:    [name] now comes in for follow-up.  We see her every 6 months.  She is managing okay.  She does have a little bit of weight loss.  She says she is eating okay.  She does have SIBO.  She also has IBS.  She does have some bowel issues.  She is trying to stay hydrated.  She was told by her doctor that she had dehydrated kidneys.  I am unsure exactly what this means.  Her husband is doing okay.  He has an element of dementia.  She has a bad essential tremor.  She is followed for this by I think neurology.  Apparently, she also has some blood pressure issues.  She is not on any blood pressure medicine right now.  I gave her the name of Micardis and we will see what her family doctor does.  She has had no bleeding.  Has been no rashes.  She has had no fever.  She had COVID in early March.  She was not placed on any anti-COVID medication.  She thinks that she may have "long COVID."  She does get fatigued fairly easily.  Overall, I will say that her performance status is probably ECOG 2.     Medications:  Current Outpatient Medications:    Spacer/Aero-Holding Chambers (EASIVENT MASK SMALL) MISC, See admin instructions., Disp: , Rfl:    acetaminophen (TYLENOL) 325 MG tablet, Take 650 mg by mouth at bedtime as needed for mild pain., Disp: , Rfl:    albuterol (VENTOLIN HFA) 108 (90 Base) MCG/ACT inhaler, Inhale 2 puffs into the lungs every 6 (six) hours as needed for wheezing or shortness of breath., Disp: 6.7 g, Rfl: 0   Cholecalciferol (VITAMIN D3) 50 MCG (2000 UT) capsule, Take  4,000 Units by mouth daily. , Disp: , Rfl:    diazepam (VALIUM) 5 MG tablet, TAKE 1/2 TO 1 TABLET 3 TIMES A DAY FOR TREMOR, Disp: , Rfl:    hydroquinone 4 % cream, Apply 1 a small amount to affected area twice a day, Disp: , Rfl:    hyoscyamine (LEVSIN SL) 0.125 MG SL tablet, Take    1 to 2 tablets     every 4 hours       as needed for Nausea, Cramping, Bloating or Diarrhea DIARRHEA, Disp: 90 tablet, Rfl: 0   levothyroxine (SYNTHROID) 50 MCG tablet, TAKE 1 TABLET DAILY ON AN EMPTY STOMACH WITH ONLY WATER FOR 30 MINUTES & NO ANTACID MEDS, CALCIUM OR MAGNESIUM FOR 4 HOURS & AVOID BIOTIN, Disp: 90 tablet, Rfl: 3   Magnesium 250 MG TABS, Take 1 tablet by mouth as needed., Disp: , Rfl:    omeprazole (PRILOSEC) 20 MG capsule, Take 1 capsule (20 mg total) by mouth daily., Disp: 30 capsule, Rfl: 11   promethazine-dextromethorphan (PROMETHAZINE-DM) 6.25-15 MG/5ML syrup, Take 5 mLs by mouth 4 (four) times daily as needed for cough. (Patient not taking: Reported on 11/23/2022), Disp: 240 mL, Rfl: 1   rosuvastatin (CRESTOR) 5 MG tablet, TAKE 1/4 TABLET DAILY FOR CHOLESTEROL, Disp: 23 tablet, Rfl: 1   traZODone (  DESYREL) 150 MG tablet, Take 1/2 to 1 tablet at bedtime, Disp: 30 tablet, Rfl: 11   vitamin C (ASCORBIC ACID) 500 MG tablet, Take 500 mg by mouth daily., Disp: , Rfl:   Allergies:  Allergies  Allergen Reactions   Alendronate Other (See Comments)    "made me choke"  "made me choke", chokes   Risedronate Sodium Other (See Comments)    "made me choke"     Sulfa Antibiotics Other (See Comments)    Doesn't remember    Sulfasalazine Other (See Comments)    Doesn't remember    Other Rash   Tetracyclines & Related Rash    Past Medical History, Surgical history, Social history, and Family History were reviewed and updated.  Review of Systems: Review of Systems  Constitutional:  Positive for malaise/fatigue.  HENT:  Positive for congestion.   Eyes: Negative.   Respiratory:  Positive for  shortness of breath and wheezing.   Cardiovascular: Negative.   Gastrointestinal: Negative.   Genitourinary: Negative.   Musculoskeletal: Negative.   Skin: Negative.   Neurological: Negative.   Endo/Heme/Allergies: Negative.   Psychiatric/Behavioral: Negative.      Physical Exam:  height is  (1.626 m) and weight is 102 lb (46.3 kg). Her oral temperature is 97.7 F (36.5 C). Her blood pressure is 163/69 (abnormal) and her pulse is 79. Her respiration is 18 and oxygen saturation is 100%.   Physical Exam Vitals reviewed.  HENT:     Head: Normocephalic and atraumatic.  Eyes:     Pupils: Pupils are equal, round, and reactive to light.  Cardiovascular:     Rate and Rhythm: Normal rate and regular rhythm.     Heart sounds: Normal heart sounds.  Pulmonary:     Effort: Pulmonary effort is normal.     Breath sounds: Normal breath sounds.  Abdominal:     General: Bowel sounds are normal.     Palpations: Abdomen is soft.  Musculoskeletal:        General: No tenderness or deformity. Normal range of motion.     Cervical back: Normal range of motion.  Lymphadenopathy:     Cervical: No cervical adenopathy.  Skin:    General: Skin is warm and dry.     Findings: No erythema or rash.  Neurological:     Mental Status: She is alert and oriented to person, place, and time.  Psychiatric:        Behavior: Behavior normal.        Thought Content: Thought content normal.        Judgment: Judgment normal.      Lab Results  Component Value Date   WBC 5.5 01/04/2023   HGB 13.7 01/04/2023   HCT 43.5 01/04/2023   MCV 99.5 01/04/2023   PLT 205 01/04/2023     Chemistry      Component Value Date/Time   NA 141 11/23/2022 1102   NA 141 07/13/2017 0743   NA 138 08/01/2015 1124   K 4.5 11/23/2022 1102   K 4.2 07/13/2017 0743   K 4.0 08/01/2015 1124   CL 103 11/23/2022 1102   CL 105 07/13/2017 0743   CO2 29 11/23/2022 1102   CO2 27 07/13/2017 0743   CO2 28 08/01/2015 1124   BUN 18  11/23/2022 1102   BUN 16 07/13/2017 0743   BUN 10.4 08/01/2015 1124   CREATININE 1.16 (H) 11/23/2022 1102   CREATININE 0.8 08/01/2015 1124      Component Value Date/Time  CALCIUM 9.7 11/23/2022 1102   CALCIUM 9.5 07/13/2017 0743   CALCIUM 9.6 08/01/2015 1124   ALKPHOS 60 07/20/2022 1013   ALKPHOS 68 07/13/2017 0743   ALKPHOS 88 08/01/2015 1124   AST 25 11/23/2022 1102   AST 35 07/20/2022 1013   AST 23 08/01/2015 1124   ALT 15 11/23/2022 1102   ALT 20 07/20/2022 1013   ALT 19 07/13/2017 0743   ALT 12 08/01/2015 1124   BILITOT 0.6 11/23/2022 1102   BILITOT 0.7 07/20/2022 1013   BILITOT <0.30 08/01/2015 1124      Impression and Plan: Ms. Keng is 82 year old white female. She has mantle cell lymphoma. Again, by the [facility] she is an intermediate risk.  She received 2 different courses of chemotherapy. She could not tolerate R-CHOP with Velcade substituting for vincristine. This however was incredibly helpful. We finished up with Rituxan/bendamustine. She completed this in August 2016.  We then started maintenance Rituxan. She started this in November 2016.  She completed this in November 2018.    I am just very impressed L[place]ler that we have not yet had any evidence of recurrence of the mantle cell lymphoma.  I had to believe that she has a significant chance of being cured.  I had that she has these other issues.  Hopefully, her husband's dementia will not worsen.  At this point, I think we can probably let her go from the clinic.  I still think that we are adding much to her medical care.  He has been 6 years since she had any treatment.  If she does have any problems down the road, we we will be more happy to see her back.  Josph Macho, MD 4/23/20248:35 AM

## 2023-01-18 ENCOUNTER — Other Ambulatory Visit: Payer: PPO

## 2023-01-18 ENCOUNTER — Ambulatory Visit: Payer: PPO | Admitting: Hematology & Oncology

## 2023-02-09 ENCOUNTER — Other Ambulatory Visit: Payer: Self-pay | Admitting: Nurse Practitioner

## 2023-02-09 DIAGNOSIS — E782 Mixed hyperlipidemia: Secondary | ICD-10-CM

## 2023-02-21 ENCOUNTER — Other Ambulatory Visit: Payer: Self-pay | Admitting: Internal Medicine

## 2023-02-21 DIAGNOSIS — Z1231 Encounter for screening mammogram for malignant neoplasm of breast: Secondary | ICD-10-CM

## 2023-03-03 NOTE — Progress Notes (Signed)
MEDICARE ANNUAL WELLNESS VISIT   Assessment:   Encounter for Medicare Annual Wellness Visit  Due Annually  Health Maintenance Reviewed  Healthy lifestyle reviewed and goals set   Labile hypertension - continue DASH diet, exercise and monitor at home. Call if greater than 130/80.  - CBC, CMP   Chronic obstructive airway disease with asthma (HCC) Continue follow up pulmonology  Hypothyroidism Please take your thyroid medication greater than 30 min before breakfast, separated by at least 4 hours  from antacids, calcium, iron, and multivitamins.  - TSH  Obstructive bronchiectasis (HCC) Continue follow up pulmonology  History of DVT Likely due to chemo/cancer, will monitor for signs, stay on bASA  Mantle cell lymphoma of intra-abdominal lymph nodes (HCC) In remission, Continue follow up oncology  Irritable bowel syndrome, unspecified type If not on benefiber then add it, decrease stress,  if any worsening symptoms, blood in stool, AB pain, etc call office  Diverticulosis of intestine without bleeding, unspecified intestinal tract location Increase fiber, if any worsening symptoms, blood in stool, AB pain, etc call office  Osteoporosis, unspecified osteoporosis type, unspecified pathological fracture presence Continue supplement  Osteoarthritis, unspecified osteoarthritis type, unspecified site monitor  Anxiety state continue medications, stress management techniques discussed, increase water, good sleep hygiene discussed, increase exercise, and increase veggies.   Mixed hyperlipidemia decrease fatty foods increase activity.  - Lipid panel  Abnormal glucose Continue diet and exercise  CKD stage 3a Increase fluids, avoid NSAIDS, monitor sugars, will monitor  Normocytic anemia -     monitor  Acquired hypogammaglobulinemia (HCC) Continue follow up oncology  Atherosclerosis of aorta (HCC) by PET scan 04/21/18 Control blood pressure, cholesterol, glucose, increase  exercise.  Appt with Dr. Antoine Poche 06/04/21  Essential tremor No worsening symptoms  OSA and COPD overlap syndrome (HCC) Continue follow up Dr. Vickey Huger    Over 30 minutes of counseling, chart review and critical decision making was performed Future Appointments  Date Time Provider Department Center  03/15/2023 11:40 AM [facility] 3 [facility]  06/06/2023 11:30 AM Lucky Cowboy, MD [facility] None  07/04/2023 11:00 AM Shawnie Dapper, NP [facility] None  11/25/2023 11:00 AM Lucky Cowboy, MD [facility] None  03/06/2024 11:00 AM Raynelle Dick, NP [facility] None    Plan:   During the course of the visit the patient was educated and counseled about appropriate screening and preventive services including:   Pneumococcal vaccine  Prevnar 13 Influenza vaccine Td vaccine Screening electrocardiogram Bone densitometry screening Colorectal cancer screening Diabetes screening Glaucoma screening Nutrition counseling  Advanced directives: requested   Subjective:  Shannon Parsons is a 82 y.o. female who presents for Medicare Annual Wellness Visit and follow up.    She has been having pain in her legs when standing for  periods of time, this has also limited her activity. Has been getting fatigued easier- doing spring cleaning and needing to rest more often.   She does have osteoporosis. Was unable to tolerate medication. She is taking Vit D.   Had last visit with Dr Russella Dar on 09/16/22 for GERD and constipation. Last colonoscopy was 05/25/22. No further colonoscopy unless issues.   She is seeing Dr. Marylou Flesher , has OSA with CPAP Had home sleep study 11/2021 which confirmed mild to moderate sleep apnea.  She will have nocturia 1-2. - Had some pelvic floor training at [facility] urology and symptoms improving.   She was having a lot of stress related to her husbands dementia.  She is trying to let some of  that stress go and has been doing better.   BP has been a little more elevated at  home due to stress, today their BP is BP: 124/64 BP Readings from Last 3 Encounters:  03/04/23 124/64  01/04/23 (!) 163/69  11/23/22 124/80   She does workout. She denies chest pain, shortness of breath, dizziness.    BMI is Body mass index is 17.51 kg/m., she has been working on diet and exercise. She is down 12 pounds from last year.  She eats a lot of crackers and peanut butter, cereal or oatmeal. She has protein and vegetables for dinner- portions slightly smaller. She does use boost and makes a protein shake Wt Readings from Last 3 Encounters:  03/04/23 102 lb (46.3 kg)  01/04/23 102 lb (46.3 kg)  11/23/22 102 lb 9.6 oz (46.5 kg)    She has a history of Mantle cell lymphoma, thromboembolic right leg due to cancer/chemo that is resolved, and bronchiectasis and acquired hypogammaglobulinemia, she continues to follow with Dr. Myna Hidalgo, and is doing very well. Followups have been good. Last visit was 01/04/23  She is on CPAP, has chronic cough/sinusitis following with Dr. Sherene Sires and Pollyann Kennedy and has obstructive bronchiectasis, she states it is getting better.   She is not on cholesterol medication and denies myalgias. Her cholesterol is not at goal. The cholesterol last visit was:   Lab Results  Component Value Date   CHOL 166 11/23/2022   HDL 45 (L) 11/23/2022   LDLCALC 97 11/23/2022   TRIG 143 11/23/2022   CHOLHDL 3.7 11/23/2022   Last U9W Lab Results  Component Value Date   HGBA1C 5.9 (H) 11/23/2022   Last GFR: Lab Results  Component Value Date   GFRNONAA 52 (L) 01/04/2023   Patient is on Vitamin D supplement.   Lab Results  Component Value Date   VD25OH 41 11/23/2022     She is on thyroid medication. Her medication is 1 pill of 50 mcg daily Lab Results  Component Value Date   TSH 1.92 11/23/2022    Medication Review:  Current Outpatient Medications (Endocrine & Metabolic):    levothyroxine (SYNTHROID) 50 MCG tablet, TAKE 1 TABLET DAILY ON AN EMPTY STOMACH WITH ONLY  WATER FOR 30 MINUTES & NO ANTACID MEDS, CALCIUM OR MAGNESIUM FOR 4 HOURS & AVOID BIOTIN  Current Outpatient Medications (Cardiovascular):    rosuvastatin (CRESTOR) 5 MG tablet, TAKE 1/4 TABLET DAILY FOR CHOLESTEROL  Current Outpatient Medications (Respiratory):    albuterol (VENTOLIN HFA) 108 (90 Base) MCG/ACT inhaler, Inhale 2 puffs into the lungs every 6 (six) hours as needed for wheezing or shortness of breath.  Current Outpatient Medications (Analgesics):    acetaminophen (TYLENOL) 325 MG tablet, Take 650 mg by mouth at bedtime as needed for mild pain.   Current Outpatient Medications (Other):    Cholecalciferol (VITAMIN D3) 50 MCG (2000 UT) capsule, Take 4,000 Units by mouth daily.    diazepam (VALIUM) 5 MG tablet, TAKE 1/2 TO 1 TABLET 3 TIMES A DAY FOR TREMOR   hydroquinone 4 % cream, Apply 1 a small amount to affected area twice a day   hyoscyamine (LEVSIN SL) 0.125 MG SL tablet, Take    1 to 2 tablets     every 4 hours       as needed for Nausea, Cramping, Bloating or Diarrhea DIARRHEA   Magnesium 250 MG TABS, Take 1 tablet by mouth as needed.   Spacer/Aero-Holding Chambers (EASIVENT MASK SMALL) MISC, See admin instructions.  traZODone (DESYREL) 150 MG tablet, Take 1/2 to 1 tablet at bedtime (Patient taking differently: Take 1/2 to 1 tablet at bedtime PRN)   vitamin C (ASCORBIC ACID) 500 MG tablet, Take 500 mg by mouth daily.   Allergies  Allergen Reactions   Alendronate Other (See Comments)    "made me choke"  "made me choke", chokes   Risedronate Sodium Other (See Comments)    "made me choke"     Sulfa Antibiotics Other (See Comments)    Doesn't remember    Sulfasalazine Other (See Comments)    Doesn't remember    Other Rash   Tetracyclines & Related Rash    Current Problems (verified) Patient Active Problem List   Diagnosis Date Noted   Dyslipidemia 10/06/2022   Psychophysiological insomnia 10/06/2021   Bronchiectasis with acute lower respiratory infection  (HCC) 03/20/2020   OSA (obstructive sleep apnea) 03/20/2020   Moderate persistent asthma with status asthmaticus 03/20/2020   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 08/09/2017   Tremor 05/06/2016   Acquired hypogammaglobulinemia (HCC) 04/05/2016   Obstructive bronchiectasis (HCC) 03/18/2016   Body mass index (BMI) of 20.0-20.9 in adult 07/21/2015   History of DVT (deep vein thrombosis)    Normocytic anemia 01/04/2015   Mantle cell lymphoma (HCC) 10/18/2014   Medication management 07/15/2014   OSA and COPD overlap syndrome (HCC) 07/05/2014   Anxiety state 02/27/2014   Hyperlipidemia, mixed    Hypotension    GERD    Osteoporosis  (Refuses treatment)    Osteoarthritis    Vitamin D deficiency    IBS    Diverticulosis    History of colonic polyps 07/19/2011   Chronic obstructive airway disease with asthma 03/16/2011    Screening Tests Health Maintenance  Topic Date Due   COVID-19 Vaccine (4 - 2023-24 season) 03/20/2023 (Originally 05/14/2022)   Zoster Vaccines- Shingrix (1 of 2) 06/04/2023 (Originally 08/17/1960)   Medicare Annual Wellness (AWV)  03/03/2024   Colonoscopy  05/26/2027   Pneumonia Vaccine 65+ Years old  Completed   DEXA SCAN  Completed   Hepatitis C Screening  Completed   HPV VACCINES  Aged Out   DTaP/Tdap/Td  Discontinued   INFLUENZA VACCINE  Discontinued   Immunization History  Administered Date(s) Administered   PFIZER(Purple Top)SARS-COV-2 Vaccination 10/29/2019, 11/19/2019, 08/06/2020   Pneumococcal Conjugate-13 03/18/2016   Pneumococcal Polysaccharide-23 06/18/2013   Zoster, Live 04/17/2013    Mammogram scheduled 03/15/23  Names of Other Physician/Practitioners you currently use: 1. [facility] here for primary care 2. [facility], eye doctor, scheduled 06/2023 3. Toni Arthurs dentist,01/2023 Patient Care Team: Lucky Cowboy, MD as PCP - General (Internal Medicine) Rollene Rotunda, MD as PCP - Cardiology (Cardiology) Meryl Dare, MD as Consulting Physician (Gastroenterology) Jonelle Sidle, MD as Consulting Physician (Cardiology) Clance, Maree Krabbe, MD as Consulting Physician (Pulmonary Disease) Josph Macho, MD as Consulting Physician (Oncology) Charlett Nose, [facility] (Inactive) as Pharmacist (Pharmacist)  SURGICAL HISTORY She  has a past surgical history that includes Appendectomy; Tonsillectomy; Bunionectomy; Knee arthroscopy; Umbilical hernia repair; Video bronchoscopy (Bilateral, 10/12/2017); Cataract extraction, bilateral; and IR REMOVAL TUN ACCESS W/ PORT W/O FL MOD SED (03/04/2020). FAMILY HISTORY Her family history includes Emphysema in her father; Heart attack (age of onset: 34) in her father; Heart disease in her father;  Heart disease (age of onset: 44) in her brother; Hypertension in her mother; Rheum arthritis in her sister; Thyroid disease in her mother. SOCIAL HISTORY She  reports that she has never smoked. She has never used smokeless tobacco. She reports that she does not drink alcohol and does not use drugs.   MEDICARE WELLNESS OBJECTIVES: Physical activity:  Housework or yard work only Cardiac risk factors: Cardiac Risk Factors include: hypertension;advanced age (>63men, >10 women);dyslipidemia Depression/mood screen:      03/04/2023   11:30 AM  Depression screen PHQ 2/9  Decreased Interest 2  Down, Depressed, Hopeless 2  PHQ - 2 Score 4  Altered sleeping 2  Tired, decreased energy 3  Change in appetite 0  Feeling bad or failure about yourself  0  Trouble concentrating 1  Moving slowly or fidgety/restless 0  Suicidal thoughts 0  PHQ-9 Score 10  Difficult doing work/chores Somewhat difficult    ADLs:     03/04/2023   11:24 AM 11/23/2022    10:13 PM  In your present state of health, do you have any difficulty performing the following activities:  Hearing? 0 0  Vision? 0 0  Difficulty concentrating or making decisions? 0 0  Walking or climbing stairs? 0 0  Dressing or bathing? 0 0  Doing errands, shopping? 0 0     Cognitive Testing  Alert? Yes  Normal Appearance?Yes  Oriented to person? Yes  Place? Yes   Time? Yes  Recall of three objects?  Yes  Can perform simple calculations? Yes  Displays appropriate judgment?Yes  Can read the correct time from a watch face?Yes  EOL planning: Does Patient Have a Medical Advance Directive?: Yes Type of Advance Directive: Living will, Healthcare Power of Attorney Does patient want to make changes to medical advance directive?: No - Patient declined Copy of Healthcare Power of Attorney in Chart?: No - copy requested  Review of Systems  Constitutional:  Positive for malaise/fatigue. Negative for chills, fever and weight loss.  HENT:  Negative for congestion, hearing loss, sore throat and tinnitus.   Eyes: Negative.  Negative for blurred vision and double vision.  Respiratory:  Negative for cough, shortness of breath and wheezing.   Cardiovascular:  Negative for chest pain, palpitations, orthopnea and leg swelling.  Gastrointestinal:  Negative for abdominal pain, blood in stool, constipation, diarrhea, heartburn, melena, nausea and vomiting.  Genitourinary:  Negative for hematuria.  Musculoskeletal:  Positive for myalgias. Negative for back pain, falls and joint pain (lower legs.).  Skin: Negative.  Negative for rash.  Neurological:  Positive for tremors. Negative for dizziness, tingling, sensory change, loss of consciousness and headaches.  Psychiatric/Behavioral:  Negative for depression, memory loss and suicidal ideas. The patient is not nervous/anxious and does not have insomnia.      Objective:     Today's Vitals   03/04/23 1058  BP: 124/64  Pulse: 80  Temp: 97.7 F (36.5  C)  SpO2: 98%  Weight: 102 lb (46.3 kg)  Height: 5\' 4"  (1.626 m)    Body mass index is 17.51 kg/m.  General Appearance: Frail appearing elder, in no apparent distress. Eyes: PERRLA, EOMs, conjunctiva no swelling or erythema Sinuses: No Frontal/maxillary tenderness ENT/Mouth: Ext aud canals clear, TMs without erythema, bulging. No erythema, swelling, or exudate on post pharynx.  Tonsils not swollen or erythematous. Hearing normal.  Neck: Supple, thyroid normal.  Respiratory: Respiratory effort normal, BS equal bilaterally without rales, rhonchi, wheezing or stridor.  Cardio: RRR with no MRGs. Very slow  pulse Abdomen: Soft, + BS.  no guarding, rebound, hernias, masses. Lymphatics: Non tender without lymphadenopathy.  Musculoskeletal: Full ROM, 5/5 strength, Normal gait Skin: Warm, dry without rashes, lesions, ecchymosis.  Neuro: Cranial nerves intact. No cerebellar symptoms. Essential tremor noticed of head- slightly more pronounced Psych: Awake and oriented X 3, normal affect, Insight and Judgment appropriate.  Medicare Attestation I have personally reviewed: The patient's medical and social history Their use of alcohol, tobacco or illicit drugs Their current medications and supplements The patient's functional ability including ADLs,fall risks, home safety risks, cognitive, and hearing and visual impairment Diet and physical activities Evidence for depression or mood disorders  The patient's weight, height, BMI, and visual acuity have been recorded in the chart.  I have made referrals, counseling, and provided education to the patient based on review of the above and I have provided the patient with a written personalized care plan for preventive services.     Revonda Humphrey ANP-C  [facility]  03/04/2023

## 2023-03-04 ENCOUNTER — Encounter: Payer: Self-pay | Admitting: Nurse Practitioner

## 2023-03-04 ENCOUNTER — Ambulatory Visit (INDEPENDENT_AMBULATORY_CARE_PROVIDER_SITE_OTHER): Payer: PPO | Admitting: Nurse Practitioner

## 2023-03-04 VITALS — BP 124/64 | HR 80 | Temp 97.7°F | Ht 64.0 in | Wt 102.0 lb

## 2023-03-04 DIAGNOSIS — M810 Age-related osteoporosis without current pathological fracture: Secondary | ICD-10-CM

## 2023-03-04 DIAGNOSIS — K589 Irritable bowel syndrome without diarrhea: Secondary | ICD-10-CM

## 2023-03-04 DIAGNOSIS — R6889 Other general symptoms and signs: Secondary | ICD-10-CM | POA: Diagnosis not present

## 2023-03-04 DIAGNOSIS — Z0001 Encounter for general adult medical examination with abnormal findings: Secondary | ICD-10-CM | POA: Diagnosis not present

## 2023-03-04 DIAGNOSIS — N1831 Chronic kidney disease, stage 3a: Secondary | ICD-10-CM | POA: Diagnosis not present

## 2023-03-04 DIAGNOSIS — R7309 Other abnormal glucose: Secondary | ICD-10-CM

## 2023-03-04 DIAGNOSIS — G4733 Obstructive sleep apnea (adult) (pediatric): Secondary | ICD-10-CM

## 2023-03-04 DIAGNOSIS — Z79899 Other long term (current) drug therapy: Secondary | ICD-10-CM

## 2023-03-04 DIAGNOSIS — D801 Nonfamilial hypogammaglobulinemia: Secondary | ICD-10-CM

## 2023-03-04 DIAGNOSIS — C8313 Mantle cell lymphoma, intra-abdominal lymph nodes: Secondary | ICD-10-CM | POA: Diagnosis not present

## 2023-03-04 DIAGNOSIS — E039 Hypothyroidism, unspecified: Secondary | ICD-10-CM

## 2023-03-04 DIAGNOSIS — J479 Bronchiectasis, uncomplicated: Secondary | ICD-10-CM | POA: Diagnosis not present

## 2023-03-04 DIAGNOSIS — I700 Atherosclerosis of aorta: Secondary | ICD-10-CM | POA: Diagnosis not present

## 2023-03-04 DIAGNOSIS — F411 Generalized anxiety disorder: Secondary | ICD-10-CM

## 2023-03-04 DIAGNOSIS — R0989 Other specified symptoms and signs involving the circulatory and respiratory systems: Secondary | ICD-10-CM

## 2023-03-04 DIAGNOSIS — M1990 Unspecified osteoarthritis, unspecified site: Secondary | ICD-10-CM

## 2023-03-04 DIAGNOSIS — D649 Anemia, unspecified: Secondary | ICD-10-CM

## 2023-03-04 DIAGNOSIS — J4489 Other specified chronic obstructive pulmonary disease: Secondary | ICD-10-CM

## 2023-03-04 DIAGNOSIS — E559 Vitamin D deficiency, unspecified: Secondary | ICD-10-CM | POA: Diagnosis not present

## 2023-03-04 DIAGNOSIS — E782 Mixed hyperlipidemia: Secondary | ICD-10-CM | POA: Diagnosis not present

## 2023-03-04 DIAGNOSIS — G250 Essential tremor: Secondary | ICD-10-CM

## 2023-03-04 DIAGNOSIS — J449 Chronic obstructive pulmonary disease, unspecified: Secondary | ICD-10-CM

## 2023-03-04 DIAGNOSIS — Z0000 Encounter for general adult medical examination without abnormal findings: Secondary | ICD-10-CM

## 2023-03-04 NOTE — Patient Instructions (Signed)

## 2023-03-05 LAB — COMPLETE METABOLIC PANEL WITH GFR
AG Ratio: 2.8 (calc) — ABNORMAL HIGH (ref 1.0–2.5)
ALT: 21 U/L (ref 6–29)
AST: 32 U/L (ref 10–35)
Albumin: 4.4 g/dL (ref 3.6–5.1)
Alkaline phosphatase (APISO): 54 U/L (ref 37–153)
BUN/Creatinine Ratio: 18 (calc) (ref 6–22)
BUN: 21 mg/dL (ref 7–25)
CO2: 28 mmol/L (ref 20–32)
Calcium: 9.5 mg/dL (ref 8.6–10.4)
Chloride: 102 mmol/L (ref 98–110)
Creat: 1.15 mg/dL — ABNORMAL HIGH (ref 0.60–1.00)
Globulin: 1.6 g/dL (calc) — ABNORMAL LOW (ref 1.9–3.7)
Glucose, Bld: 84 mg/dL (ref 65–99)
Potassium: 4.2 mmol/L (ref 3.5–5.3)
Sodium: 138 mmol/L (ref 135–146)
Total Bilirubin: 0.6 mg/dL (ref 0.2–1.2)
Total Protein: 6 g/dL — ABNORMAL LOW (ref 6.1–8.1)
eGFR: 48 mL/min/{1.73_m2} — ABNORMAL LOW (ref >=60)

## 2023-03-05 LAB — CBC WITH DIFFERENTIAL/PLATELET
Absolute Monocytes: 588 cells/uL (ref 200–950)
Basophils Absolute: 80 cells/uL (ref 0–200)
Basophils Relative: 1.5 %
Eosinophils Absolute: 122 cells/uL (ref 15–500)
Eosinophils Relative: 2.3 %
HCT: 41.9 % (ref 35.0–45.0)
Hemoglobin: 13.6 g/dL (ref 11.7–15.5)
Lymphs Abs: 996 cells/uL (ref 850–3900)
MCH: 29.4 pg (ref 27.0–33.0)
MCHC: 32.5 g/dL (ref 32.0–36.0)
MCV: 90.7 fL (ref 80.0–100.0)
MPV: 9.8 fL (ref 7.5–12.5)
Monocytes Relative: 11.1 %
Neutro Abs: 3514 cells/uL (ref 1500–7800)
Neutrophils Relative %: 66.3 %
Platelets: 174 10*3/uL (ref 140–400)
RBC: 4.62 10*6/uL (ref 3.80–5.10)
RDW: 12.8 % (ref 11.0–15.0)
Total Lymphocyte: 18.8 %
WBC: 5.3 10*3/uL (ref 3.8–10.8)

## 2023-03-05 LAB — THYROID PANEL WITH TSH
Free Thyroxine Index: 3.2 (ref 1.4–3.8)
T3 Uptake: 34 % (ref 22–35)
T4, Total: 9.4 ug/dL (ref 5.1–11.9)
TSH: 0.97 mIU/L (ref 0.40–4.50)

## 2023-03-05 LAB — LIPID PANEL
Cholesterol: 189 mg/dL (ref <200)
HDL: 72 mg/dL (ref >=50)
LDL Cholesterol (Calc): 96 mg/dL (calc)
Non-HDL Cholesterol (Calc): 117 mg/dL (calc) (ref <130)
Total CHOL/HDL Ratio: 2.6 (calc) (ref <5.0)
Triglycerides: 109 mg/dL (ref <150)

## 2023-03-07 NOTE — Progress Notes (Signed)
Patient is aware of lab results and instructions. -e welch

## 2023-03-15 ENCOUNTER — Ambulatory Visit
Admission: RE | Admit: 2023-03-15 | Discharge: 2023-03-15 | Disposition: A | Discharge status: Home / Self Care | Payer: PPO | Source: Ambulatory Visit | Attending: Internal Medicine | Admitting: Internal Medicine

## 2023-03-15 DIAGNOSIS — Z1231 Encounter for screening mammogram for malignant neoplasm of breast: Secondary | ICD-10-CM | POA: Diagnosis not present

## 2023-04-22 ENCOUNTER — Other Ambulatory Visit: Payer: Self-pay | Admitting: Nurse Practitioner

## 2023-05-23 DIAGNOSIS — R21 Rash and other nonspecific skin eruption: Secondary | ICD-10-CM | POA: Diagnosis not present

## 2023-05-23 DIAGNOSIS — L308 Other specified dermatitis: Secondary | ICD-10-CM | POA: Diagnosis not present

## 2023-06-05 ENCOUNTER — Encounter: Payer: Self-pay | Admitting: Internal Medicine

## 2023-06-05 NOTE — Progress Notes (Unsigned)
Future Appointments  Date Time Provider Department  06/06/2023                   6 mo  11:30 AM Lucky Cowboy, MD [facility]  11/25/2023                   cpe 11:00 AM Lucky Cowboy, MD [facility]  03/06/2024                    wellness 11:00 AM Raynelle Dick, NP [facility]    History of Present Illness:       This very nice 82 y.o.  MWF  with labile HTN, HLD, Hypothyroidism, Prediabetes,  Essential Hereditary Tremor ,OSA/CPAP  and Vitamin D Deficiency  who presents for  6 month follow up with HTN, HLD, Pre-Diabetes and Vitamin D Deficiency.   Patient has hx/o Mantle Cell Lymphoma (2016) being followed by Dr Myna Hidalgo.  Patient has OSA on CPAP followed by Dr Vickey Huger. PET scan in 05/2018 showed Aortic Atherosclerosis.        Patient is treated for HTN circa 1990's & BP has been controlled at home. Today's BP is at goal -  110/80. Patient has had no complaints of any cardiac type chest pain, palpitations, dyspnea Pollyann Kennedy /PND, dizziness, claudication or dependent edema.        Hyperlipidemia is controlled with diet & meds. Patient denies myalgias or other med SE's. Last Lipids were at goal :  Lab Results  Component Value Date   CHOL 189 03/04/2023   HDL 72 03/04/2023   LDLCALC 96 03/04/2023   TRIG 109 03/04/2023   CHOLHDL 2.6 03/04/2023     Also, the patient has history of T2_NIDDM PreDiabetes and has had no symptoms of reactive hypoglycemia, diabetic polys, paresthesias or visual blurring.  Last A1c was near goal :  Lab Results  Component Value Date   HGBA1C 5.9 (H) 11/23/2022                                                     Further, the patient also has history of Vitamin D Deficiency and supplements vitamin D . Last vitamin D was at goal :  Lab Results  Component Value Date   VD25OH 73 11/23/2022     Current Outpatient Medications on File Prior to Visit  Medication Sig   acetaminophen  325 MG tablet Take 650 mg  at bedtime as needed for mild pain.    albuterol HFA  inhaler Inhale 2 puffs every 6  hours as needed    VITAMIN D 2000ucapsule Take 4,000 Units daily.    diazepam  5 MG tablet TAKE 1/2 TO 1 Tab 3 x /  DAY - TREMOR   hydroquinone 4 % cream Apply to affected area twice a day   hyoscyamine SL 0.125 MG SL  Take    1 to 2 tablets     every 4 hours       as needed   levothyroxine 50 MCG tablet TAKE 1 TABLET DAILY    Magnesium 250 MG TABS Take 1 tablet  as needed.   rosuvastatin  5 MG tablet TAKE 1/4 TABLET DAILY    traZODone 150 MG tablet Take 1/2 to 1 tablet at bedtime    vitamin C  500 MG tablet  Take 500 mg by mouth daily.     Allergies  Allergen Reactions   Alendronate Other (See Comments)    "made me choke" "made me choke"   Risedronate Sodium Other (See Comments)    "made me choke"    Sulfa Antibiotics Other (See Comments)   Sulfasalazine Other (See Comments)   Other Rash   Tetracyclines & Related Rash     PMHx:   Past Medical History:  Diagnosis Date   Adenomatous colon polyp 1994   Asthma    Cataract 2013   bilateral eyes   Diverticulosis    Family history of ischemic heart disease    Fibrocystic breast disease    GERD (gastroesophageal reflux disease)    Hiatal hernia    Hyperlipidemia    IBS (irritable bowel syndrome)    Internal hemorrhoids    Mantle cell lymphoma (HCC) 10/18/2014   Melanoma (HCC)    Facial, pt denies on 01/16/18   OSA (obstructive sleep apnea)    Osteoarthritis    Osteoporosis    Prediabetes    Unspecified hypothyroidism    Vitamin D deficiency       Immunization History  Administered Date(s) Administered   PFIZER SARS-COV-2 Vacci  10/29/2019, 11/19/2019, 08/06/2020   Pneumococcal  -13 03/18/2016   Pneumococcal  -23 06/18/2013   Zoster, Live 04/17/2013     Past Surgical History:  Procedure Laterality Date   APPENDECTOMY     BUNIONECTOMY     CATARACT EXTRACTION, BILATERAL     IR REMOVAL TUN ACCESS W/ PORT W/O FL MOD SED  03/04/2020   KNEE ARTHROSCOPY      TONSILLECTOMY     UMBILICAL HERNIA REPAIR     VIDEO BRONCHOSCOPY Bilateral 10/12/2017   Procedure: VIDEO BRONCHOSCOPY WITHOUT FLUORO;  Surgeon: Nyoka Cowden, MD;  Location: [facility];  Service: Endoscopy;  Laterality: Bilateral;     FHx:    Reviewed / unchanged   SHx:    Reviewed / unchanged    Systems Review:  Constitutional: Denies fever, chills, wt changes, headaches, insomnia, fatigue, night sweats, change in appetite. Eyes: Denies redness, blurred vision, diplopia, discharge, itchy, watery eyes.  ENT: Denies discharge, congestion, post nasal drip, epistaxis, sore throat, earache, hearing loss, dental pain, tinnitus, vertigo, sinus pain, snoring.  CV: Denies chest pain, palpitations, irregular heartbeat, syncope, dyspnea, diaphoresis, orthopnea, PND, claudication or edema. Respiratory: denies cough, dyspnea, DOE, pleurisy, hoarseness, laryngitis, wheezing.  Gastrointestinal: Denies dysphagia, odynophagia, heartburn, reflux, water brash, abdominal pain or cramps, nausea, vomiting, bloating, diarrhea, constipation, hematemesis, melena, hematochezia  or hemorrhoids. Genitourinary: Denies dysuria, frequency, urgency, nocturia, hesitancy, discharge, hematuria or flank pain. Musculoskeletal: Denies arthralgias, myalgias, stiffness, jt. swelling, pain, limping or strain/sprain.  Skin: Denies pruritus, rash, hives, warts, acne, eczema or change in skin lesion(s). Neuro: No weakness, tremor, incoordination, spasms, paresthesia or pain. Psychiatric: Denies confusion, memory loss or sensory loss. Endo: Denies change in weight, skin or hair change.  Heme/Lymph: No excessive bleeding, bruising or enlarged lymph nodes.   Physical Exam  BP 110/80   Pulse 76   Temp 97.9 F (36.6 C)   Resp 16   Ht 5\' 4"  (1.626 m)   Wt 99 lb 6.4 oz (45.1 kg)   LMP 09/14/2014   SpO2 96%   BMI 17.06 kg/m   Appears  well nourished, well groomed  and in no distress.  Eyes: PERRLA, EOMs, conjunctiva  no swelling or erythema. Sinuses: No frontal/maxillary tenderness ENT/Mouth: EAC's clear, TM's nl w/o erythema, bulging. Nares  clear w/o erythema, swelling, exudates. Oropharynx clear without erythema or exudates. Oral hygiene is good. Tongue normal, non obstructing. Hearing intact.  Neck: Supple. Thyroid not palpable. Car 2+/2+ without bruits, nodes or JVD. Chest: Respirations nl with BS clear & equal w/o rales, rhonchi, wheezing or stridor.  Cor: Heart sounds normal w/ regular rate and rhythm without sig. murmurs, gallops, clicks or rubs. Peripheral pulses normal and equal  without edema.  Abdomen: Soft & bowel sounds normal. Non-tender w/o guarding, rebound, hernias, masses or organomegaly.  Lymphatics: Unremarkable.  Musculoskeletal: Full ROM all peripheral extremities, joint stability, 5/5 strength and normal gait.  Skin: Warm, dry without exposed rashes, lesions or ecchymosis apparent.  Neuro: Cranial nerves intact, reflexes equal bilaterally. Sensory-motor testing grossly intact. Tendon reflexes grossly intact.  Pysch: Alert & oriented x 3.  Insight and judgement nl & appropriate. No ideations.   Assessment and Plan:   1. Labile hypertension  - Continue medication, monitor blood pressure at home.  - Continue DASH diet.  Reminder to go to the ER if any CP,  SOB, nausea, dizziness, severe HA, changes vision/speech.  - CBC with Differential/Platelet - COMPLETE METABOLIC PANEL WITH GFR - Magnesium - TSH   2. Hyperlipidemia, mixed  - Continue diet/meds, exercise,& lifestyle modifications.  - Continue monitor periodic cholesterol/liver & renal functions   - Lipid panel - TSH   3. Vitamin D deficiency  - Continue supplementation   - Hemoglobin A1c - Insulin, random   4. Abnormal glucose  - Continue diet, exercise  - Lifestyle modifications.  - Monitor appropriate labs   - VITAMIN D 25 Hydroxy   5. Hypothyroidism  - TSH   6. CKD stage 3a, GFR 45-59 ml/min  (HCC)  - COMPLETE METABOLIC PANEL WITH GFR   7. OSA on CPAP   8. Hereditary essential tremor   9. Medication management  - CBC with Differential/Platelet - COMPLETE METABOLIC PANEL WITH GFR - Magnesium - Lipid panel - TSH - Hemoglobin A1c - Insulin, random         Discussed  regular exercise, BP monitoring, weight control to achieve/maintain BMI less than 25 and discussed med and SE's. Recommended labs to assess /monitor clinical status .  I discussed the assessment and treatment plan with the patient. The patient was provided an opportunity to ask questions and all were answered. The patient agreed with the plan and demonstrated an understanding of the instructions.  I provided over 30 minutes of exam, counseling, chart review and  complex critical decision making.         The patient was advised to call back or seek an in-person evaluation if the symptoms worsen or if the condition fails to improve as anticipated.   Marinus Maw, MD

## 2023-06-05 NOTE — Patient Instructions (Signed)

## 2023-06-06 ENCOUNTER — Encounter: Payer: Self-pay | Admitting: Internal Medicine

## 2023-06-06 ENCOUNTER — Ambulatory Visit (INDEPENDENT_AMBULATORY_CARE_PROVIDER_SITE_OTHER): Payer: PPO | Admitting: Internal Medicine

## 2023-06-06 VITALS — BP 110/80 | HR 76 | Temp 97.9°F | Resp 16 | Ht 64.0 in | Wt 99.4 lb

## 2023-06-06 DIAGNOSIS — R7309 Other abnormal glucose: Secondary | ICD-10-CM | POA: Diagnosis not present

## 2023-06-06 DIAGNOSIS — R0989 Other specified symptoms and signs involving the circulatory and respiratory systems: Secondary | ICD-10-CM | POA: Diagnosis not present

## 2023-06-06 DIAGNOSIS — G250 Essential tremor: Secondary | ICD-10-CM

## 2023-06-06 DIAGNOSIS — E782 Mixed hyperlipidemia: Secondary | ICD-10-CM | POA: Diagnosis not present

## 2023-06-06 DIAGNOSIS — Z79899 Other long term (current) drug therapy: Secondary | ICD-10-CM | POA: Diagnosis not present

## 2023-06-06 DIAGNOSIS — G4733 Obstructive sleep apnea (adult) (pediatric): Secondary | ICD-10-CM

## 2023-06-06 DIAGNOSIS — E039 Hypothyroidism, unspecified: Secondary | ICD-10-CM

## 2023-06-06 DIAGNOSIS — N1831 Chronic kidney disease, stage 3a: Secondary | ICD-10-CM | POA: Diagnosis not present

## 2023-06-06 DIAGNOSIS — E559 Vitamin D deficiency, unspecified: Secondary | ICD-10-CM | POA: Diagnosis not present

## 2023-06-07 LAB — INSULIN, RANDOM: Insulin: 3.4 u[IU]/mL

## 2023-06-07 LAB — LIPID PANEL
Cholesterol: 194 mg/dL (ref <200)
HDL: 73 mg/dL (ref >=50)
LDL Cholesterol (Calc): 100 mg/dL (calc) — ABNORMAL HIGH
Non-HDL Cholesterol (Calc): 121 mg/dL (calc) (ref <130)
Total CHOL/HDL Ratio: 2.7 (calc) (ref <5.0)
Triglycerides: 111 mg/dL (ref <150)

## 2023-06-07 LAB — HEMOGLOBIN A1C
Hgb A1c MFr Bld: 5.7 % of total Hgb — ABNORMAL HIGH (ref <5.7)
Mean Plasma Glucose: 117 mg/dL
eAG (mmol/L): 6.5 mmol/L

## 2023-06-07 LAB — CBC WITH DIFFERENTIAL/PLATELET
Absolute Monocytes: 566 cells/uL (ref 200–950)
Basophils Absolute: 39 cells/uL (ref 0–200)
Basophils Relative: 0.7 %
Eosinophils Absolute: 90 cells/uL (ref 15–500)
Eosinophils Relative: 1.6 %
HCT: 43.6 % (ref 35.0–45.0)
Hemoglobin: 14 g/dL (ref 11.7–15.5)
Lymphs Abs: 930 cells/uL (ref 850–3900)
MCH: 29.2 pg (ref 27.0–33.0)
MCHC: 32.1 g/dL (ref 32.0–36.0)
MCV: 91 fL (ref 80.0–100.0)
MPV: 10.8 fL (ref 7.5–12.5)
Monocytes Relative: 10.1 %
Neutro Abs: 3976 cells/uL (ref 1500–7800)
Neutrophils Relative %: 71 %
Platelets: 173 10*3/uL (ref 140–400)
RBC: 4.79 10*6/uL (ref 3.80–5.10)
RDW: 12.7 % (ref 11.0–15.0)
Total Lymphocyte: 16.6 %
WBC: 5.6 10*3/uL (ref 3.8–10.8)

## 2023-06-07 LAB — COMPLETE METABOLIC PANEL WITH GFR
AG Ratio: 2.4 (calc) (ref 1.0–2.5)
ALT: 22 U/L (ref 6–29)
AST: 34 U/L (ref 10–35)
Albumin: 4.6 g/dL (ref 3.6–5.1)
Alkaline phosphatase (APISO): 55 U/L (ref 37–153)
BUN/Creatinine Ratio: 18 (calc) (ref 6–22)
BUN: 20 mg/dL (ref 7–25)
CO2: 27 mmol/L (ref 20–32)
Calcium: 10.3 mg/dL (ref 8.6–10.4)
Chloride: 102 mmol/L (ref 98–110)
Creat: 1.14 mg/dL — ABNORMAL HIGH (ref 0.60–1.00)
Globulin: 1.9 g/dL (calc) (ref 1.9–3.7)
Glucose, Bld: 86 mg/dL (ref 65–99)
Potassium: 4.2 mmol/L (ref 3.5–5.3)
Sodium: 141 mmol/L (ref 135–146)
Total Bilirubin: 0.7 mg/dL (ref 0.2–1.2)
Total Protein: 6.5 g/dL (ref 6.1–8.1)
eGFR: 49 mL/min/{1.73_m2} — ABNORMAL LOW (ref >=60)

## 2023-06-07 LAB — TSH: TSH: 2.22 mIU/L (ref 0.40–4.50)

## 2023-06-07 LAB — VITAMIN D 25 HYDROXY (VIT D DEFICIENCY, FRACTURES): Vit D, 25-Hydroxy: 58 ng/mL (ref 30–100)

## 2023-06-07 LAB — MAGNESIUM: Magnesium: 2.1 mg/dL (ref 1.5–2.5)

## 2023-06-07 NOTE — Progress Notes (Signed)
<>*<>*<>*<>*<>*<>*<>*<>*<>*<>*<>*<>*<>*<>*<>*<>*<>*<>*<>*<>*<>*<>*<>*<>*<> <>*<>*<>*<>*<>*<>*<>*<>*<>*<>*<>*<>*<>*<>*<>*<>*<>*<>*<>*<>*<>*<>*<>*<>*<>  -  Test results slightly outside the reference range are not unusual. If there is anything important, I will review this with you,  otherwise it is considered normal test values.  If you have further questions,  please do not hesitate to contact me at the office or via My Chart.   <>*<>*<>*<>*<>*<>*<>*<>*<>*<>*<>*<>*<>*<>*<>*<>*<>*<>*<>*<>*<>*<>*<>*<>*<> <>*<>*<>*<>*<>*<>*<>*<>*<>*<>*<>*<>*<>*<>*<>*<>*<>*<>*<>*<>*<>*<>*<>*<>*<>  -  Chol = 194   &  LD = 100 -  Excellent   - Very low risk for Heart Attack  / Stroke  <>*<>*<>*<>*<>*<>*<>*<>*<>*<>*<>*<>*<>*<>*<>*<>*<>*<>*<>*<>*<>*<>*<>*<>*<> <>*<>*<>*<>*<>*<>*<>*<>*<>*<>*<>*<>*<>*<>*<>*<>*<>*<>*<>*<>*<>*<>*<>*<>*<>  - A1c better - down from 5.9% to now 5.7% - Excellent                                                                   - Almost back to Normal nonDiabetic range    <>*<>*<>*<>*<>*<>*<>*<>*<>*<>*<>*<>*<>*<>*<>*<>*<>*<>*<>*<>*<>*<>*<>*<>*<> <>*<>*<>*<>*<>*<>*<>*<>*<>*<>*<>*<>*<>*<>*<>*<>*<>*<>*<>*<>*<>*<>*<>*<>*<>  -Vitamin D = 58 - Great - Please continue dose same   <>*<>*<>*<>*<>*<>*<>*<>*<>*<>*<>*<>*<>*<>*<>*<>*<>*<>*<>*<>*<>*<>*<>*<>*<> <>*<>*<>*<>*<>*<>*<>*<>*<>*<>*<>*<>*<>*<>*<>*<>*<>*<>*<>*<>*<>*<>*<>*<>*<>  - All Else - CBC - Kidneys - Electrolytes - Liver - Magnesium & Thyroid    - all  Normal / OK  <>*<>*<>*<>*<>*<>*<>*<>*<>*<>*<>*<>*<>*<>*<>*<>*<>*<>*<>*<>*<>*<>*<>*<>*<> <>*<>*<>*<>*<>*<>*<>*<>*<>*<>*<>*<>*<>*<>*<>*<>*<>*<>*<>*<>*<>*<>*<>*<>*<>  -

## 2023-06-08 ENCOUNTER — Other Ambulatory Visit: Payer: Self-pay | Admitting: Internal Medicine

## 2023-06-08 DIAGNOSIS — R251 Tremor, unspecified: Secondary | ICD-10-CM

## 2023-06-08 MED ORDER — DIAZEPAM 5 MG PO TABS
ORAL_TABLET | ORAL | 1 refills | Status: DC
Start: 2023-06-08 — End: 2023-11-12

## 2023-06-08 NOTE — Progress Notes (Signed)
Patient is aware of lab results and instructions. -e welch

## 2023-06-29 DIAGNOSIS — H524 Presbyopia: Secondary | ICD-10-CM | POA: Diagnosis not present

## 2023-06-29 DIAGNOSIS — H4421 Degenerative myopia, right eye: Secondary | ICD-10-CM | POA: Diagnosis not present

## 2023-06-29 DIAGNOSIS — H5213 Myopia, bilateral: Secondary | ICD-10-CM | POA: Diagnosis not present

## 2023-06-29 DIAGNOSIS — H15821 Localized anterior staphyloma, right eye: Secondary | ICD-10-CM | POA: Diagnosis not present

## 2023-06-29 DIAGNOSIS — H04123 Dry eye syndrome of bilateral lacrimal glands: Secondary | ICD-10-CM | POA: Diagnosis not present

## 2023-06-29 DIAGNOSIS — H26493 Other secondary cataract, bilateral: Secondary | ICD-10-CM | POA: Diagnosis not present

## 2023-07-04 ENCOUNTER — Ambulatory Visit: Payer: PPO | Admitting: Family Medicine

## 2023-07-27 DIAGNOSIS — H1132 Conjunctival hemorrhage, left eye: Secondary | ICD-10-CM | POA: Diagnosis not present

## 2023-08-03 ENCOUNTER — Other Ambulatory Visit: Payer: Self-pay | Admitting: Internal Medicine

## 2023-08-03 MED ORDER — ONDANSETRON HCL 8 MG PO TABS: ORAL_TABLET | ORAL | 0 refills | Status: DC

## 2023-08-15 ENCOUNTER — Other Ambulatory Visit: Payer: Self-pay | Admitting: Nurse Practitioner

## 2023-08-15 DIAGNOSIS — E782 Mixed hyperlipidemia: Secondary | ICD-10-CM

## 2023-08-30 ENCOUNTER — Other Ambulatory Visit: Payer: Self-pay | Admitting: Internal Medicine

## 2023-08-30 DIAGNOSIS — R0989 Other specified symptoms and signs involving the circulatory and respiratory systems: Secondary | ICD-10-CM

## 2023-08-30 MED ORDER — BISOPROLOL FUMARATE 5 MG PO TABS: 5.0000 mg | ORAL_TABLET | Freq: Every day | ORAL | 0 refills | Status: DC

## 2023-08-30 MED ORDER — OLMESARTAN MEDOXOMIL 40 MG PO TABS
ORAL_TABLET | ORAL | 0 refills | Status: DC
Start: 2023-08-30 — End: 2023-11-12

## 2023-09-02 ENCOUNTER — Ambulatory Visit: Payer: PPO | Admitting: Nurse Practitioner

## 2023-09-12 NOTE — Progress Notes (Signed)
 3 MONTH FOLLOW UP  Assessment:     Labile hypertension - continue DASH diet, exercise and monitor at home. Call if greater than 130/80.  - Nadolol  was d/c'd last visit, BP controlled - CBC, CMP   Chronic obstructive airway disease with asthma (HCC) Continue follow up pulmonology  Hypothyroidism Please take your thyroid  medication greater than 30 min before breakfast, separated by at least 4 hours  from antacids, calcium , iron, and multivitamins.  - TSH   Obstructive bronchiectasis (HCC) Continue follow up pulmonology   Mantle cell lymphoma of intra-abdominal lymph nodes (HCC) In remission, Continue follow up oncology- Dr. Timmy   Anxiety state continue medications, stress management techniques discussed, increase water, good sleep hygiene discussed, increase exercise, and increase veggies.  - Given list of therapists to try to schedule appointment to discuss coping with husband dementia  Mixed hyperlipidemia decrease fatty foods increase activity.  - Lipid panel  CKD stage 3a(HCC) Increase fluids, avoid NSAIDS, monitor sugars, will monitor  Acquired hypogammaglobulinemia (HCC) Continue follow up oncology- Dr. Timmy  Atherosclerosis of aorta [facility]) by PET scan 04/21/18 Control blood pressure, cholesterol, glucose, increase exercise.  Has been evaluated by Dr. Lavona  Essential tremor No worsening symptoms  OSA and COPD overlap syndrome (HCC) Continue follow up Dr. Chalice  Vitamin D  deficiency Continue Vit D supplementation to maintain value in therapeutic level of 60-100    Medication Management Continued  Over 30 minutes of counseling, chart review and critical decision making was performed Future Appointments  Date Time Provider Department Center  12/14/2023 11:00 AM Tonita Fallow, MD [facility] None  03/15/2024 10:30 AM Sennie Borden E, NP [facility] None       Subjective:  Shannon Parsons is a 82 y.o. female who presents for Medicare  Annual Wellness Visit and follow up.    Her pain has improved in her back. She continues to do some exercise. She does have osteoporosis. Was unable to tolerate medication. She is taking Vit D. She is having a lot more stress with her husbands dementia- she has a lot of stress with increased demands placed on her by her husband. Tremor very pronounced of head  She is seeing Dr. Albin , has OSA with CPAP Had home sleep study 11/2021 which confirmed mild to moderate sleep apnea. She no longer uses her CPAP machine  She will have nocturia 1-2. She gets very tired between 3-5 oclock.    She was having a lot of stress related to her husbands dementia. He awakens her at night to look at things outside and hears things.  Bisoprolol  5 mg every day She does have a lot of stress due to her husbands dementia Blood pressure has been controlled at home , today their BP is BP: 112/62 BP Readings from Last 3 Encounters:  09/13/23 112/62  06/06/23 110/80  03/04/23 124/64   She does workout. She denies chest pain, shortness of breath, dizziness.     BMI is Body mass index is 18.13 kg/m., she has been working on diet and exercise. Wt Readings from Last 3 Encounters:  09/13/23 105 lb 9.6 oz (47.9 kg)  06/06/23 99 lb 6.4 oz (45.1 kg)  03/04/23 102 lb (46.3 kg)    She has a history of Mantle cell lymphoma, thromboembolic right leg due to cancer/chemo that is resolved, and bronchiectasis and acquired hypogammaglobulinemia, she continues to follow with Dr. Timmy, and is doing very well. Followups have been good. Last visit was 12/2022  She is on CPAP,  has chronic cough/sinusitis following with Dr. Darlean and Jesus and has obstructive bronchiectasis, she states it is getting better.   She is not on cholesterol medication and denies myalgias. Her cholesterol is not at goal. The cholesterol last visit was:   Lab Results  Component Value Date   CHOL 194 06/06/2023   HDL 73 06/06/2023   LDLCALC 100 (H)  06/06/2023   TRIG 111 06/06/2023   CHOLHDL 2.7 06/06/2023   Last J8R Lab Results  Component Value Date   HGBA1C 5.7 (H) 06/06/2023   She has been drinking more water  Last GFR: Lab Results  Component Value Date   EGFR 49 (L) 06/06/2023    Patient is on Vitamin D  supplement.   Lab Results  Component Value Date   VD25OH 52 06/06/2023     She is on thyroid  medication. Her medication is 1 pill of 50 mcg daily Lab Results  Component Value Date   TSH 2.22 06/06/2023    Medication Review:  Current Outpatient Medications (Endocrine & Metabolic):    levothyroxine  (SYNTHROID ) 50 MCG tablet, TAKE 1 TABLET DAILY ON AN EMPTY STOMACH WITH ONLY WATER FOR 30 MINUTES. NO ANTACID MEDS, CALCIUM  OR MAGNESIUM FOR 4 HOURS AND AVOID BIOTIN  Current Outpatient Medications (Cardiovascular):    bisoprolol  (ZEBETA ) 5 MG tablet, Take 1 tablet (5 mg total) by mouth daily. Take 1 tablet Now & then every Morning for BP   olmesartan  (BENICAR ) 40 MG tablet, Take 1 tablet Now & every Night for BP   rosuvastatin  (CRESTOR ) 5 MG tablet, TAKE 1/4 TABLET DAILY FOR CHOLESTEROL  Current Outpatient Medications (Respiratory):    albuterol  (VENTOLIN  HFA) 108 (90 Base) MCG/ACT inhaler, Inhale 2 puffs into the lungs every 6 (six) hours as needed for wheezing or shortness of breath.  Current Outpatient Medications (Analgesics):    acetaminophen  (TYLENOL ) 325 MG tablet, Take 650 mg by mouth at bedtime as needed for mild pain.   Current Outpatient Medications (Other):    Cholecalciferol  (VITAMIN D3) 50 MCG (2000 UT) capsule, Take 4,000 Units by mouth daily.    diazepam  (VALIUM ) 5 MG tablet, TAKE 1/2 TO 1 TABLET 3 TIMES A DAY FOR TREMOR   hydroquinone 4 % cream, Apply 1 a small amount to affected area twice a day   hyoscyamine  (LEVSIN  SL) 0.125 MG SL tablet, Take    1 to 2 tablets     every 4 hours       as needed for Nausea, Cramping, Bloating or Diarrhea DIARRHEA   Magnesium 250 MG TABS, Take 1 tablet by mouth as  needed.   ondansetron  (ZOFRAN ) 8 MG tablet, Take 1/2 to 1 tablet 3 x /day (every 6 hours) if needed for Nausea   vitamin C  (ASCORBIC ACID ) 500 MG tablet, Take 500 mg by mouth daily.   Spacer/Aero-Holding Chambers (EASIVENT MASK SMALL) MISC, See admin instructions.   traZODone  (DESYREL ) 150 MG tablet, Take 1/2 to 1 tablet at bedtime (Patient not taking: Reported on 09/13/2023)   Allergies  Allergen Reactions   Alendronate Other (See Comments)    made me choke  made me choke, chokes   Risedronate Sodium Other (See Comments)    made me choke     Sulfa Antibiotics Other (See Comments)    Doesn't remember    Sulfasalazine Other (See Comments)    Doesn't remember    Other Rash   Tetracyclines & Related Rash    Current Problems (verified) Patient Active Problem List   Diagnosis Date Noted  Dyslipidemia 10/06/2022   Psychophysiological insomnia 10/06/2021   Bronchiectasis with acute lower respiratory infection (HCC) 03/20/2020   OSA (obstructive sleep apnea) 03/20/2020   Moderate persistent asthma with status asthmaticus 03/20/2020   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 08/09/2017   Tremor 05/06/2016   Acquired hypogammaglobulinemia (HCC) 04/05/2016   Body mass index (BMI) of 20.0-20.9 in adult 07/21/2015   History of DVT (deep vein thrombosis)    Normocytic anemia 01/04/2015   Mantle cell lymphoma (HCC) 10/18/2014   OSA and COPD overlap syndrome (HCC) 07/05/2014   Anxiety state 02/27/2014   Hyperlipidemia, mixed    Hypotension    GERD    Osteoporosis  (Refuses treatment)    Osteoarthritis    Vitamin D  deficiency    IBS    Diverticulosis    History of colonic polyps 07/19/2011   Chronic obstructive airway disease with asthma (HCC)  03/16/2011      Names of Other Physician/Practitioners you currently use: 1. [facility] here for primary care 2. [facility], eye doctor, last visit 06/14/2021  3. [facility] dentist, 10/2021 Patient Care Team: Tonita Fallow, MD as PCP - General (Internal Medicine) Lavona Agent, MD as PCP - Cardiology (Cardiology) Aneita Gwendlyn DASEN, MD as Consulting Physician (Gastroenterology) Debera Jayson MATSU, MD as Consulting Physician (Cardiology) Clance, Francis HERO, MD as Consulting Physician (Pulmonary Disease) Timmy Maude SAUNDERS, MD as Consulting Physician (Oncology) Quinn Odor, [facility] (Inactive) as Pharmacist (Pharmacist)  SURGICAL HISTORY She  has a past surgical history that includes Appendectomy; Tonsillectomy; Bunionectomy; Knee arthroscopy; Umbilical hernia repair; Video bronchoscopy (Bilateral, 10/12/2017); Cataract extraction, bilateral; and IR REMOVAL TUN ACCESS W/ PORT W/O FL MOD SED (03/04/2020). FAMILY HISTORY Her family history includes Emphysema in her father; Heart attack (age of onset: 63) in her father; Heart disease in her father; Heart disease (age of onset: 68) in her brother; Hypertension in her mother; Rheum arthritis in her sister; Thyroid  disease in her mother. SOCIAL HISTORY She  reports that she has never smoked. She has never used smokeless tobacco. She reports that she does not drink alcohol and does not use drugs.    Review of Systems  Constitutional:  Negative for chills, fever, malaise/fatigue and weight loss.  HENT:  Negative for congestion, hearing loss, sore throat and tinnitus.   Eyes: Negative.  Negative for blurred vision and double vision.  Respiratory:  Negative for cough, shortness of breath and wheezing.   Cardiovascular:  Negative for chest pain, palpitations, orthopnea and leg swelling.  Gastrointestinal:  Negative for abdominal pain, blood in stool, constipation, diarrhea, heartburn, melena, nausea and vomiting.   Genitourinary:  Negative for hematuria.  Musculoskeletal:  Negative for back pain, falls, joint pain and myalgias.  Skin: Negative.  Negative for rash.  Neurological:  Positive for tremors. Negative for dizziness, tingling, sensory change, loss of consciousness and headaches.  Psychiatric/Behavioral:  Negative for depression, memory loss and suicidal ideas. The patient is not nervous/anxious and does not have insomnia.      Objective:     Today's Vitals   09/13/23 1102  BP: 112/62  Pulse: (!) 57  Temp: (!) 97.5 F (36.4 C)  SpO2: 96%  Weight: 105 lb 9.6 oz (47.9 kg)  Height: 5' 4 (1.626 m)     Body mass index is 18.13 kg/m.  General Appearance: Frail appearing elder, in no apparent distress. Eyes: PERRLA, EOMs, conjunctiva no swelling or erythema Sinuses: No Frontal/maxillary tenderness ENT/Mouth: Ext aud canals clear, TMs without erythema, bulging. No erythema, swelling, or exudate on post pharynx.  Tonsils not swollen or erythematous. Hearing normal.  Neck: Supple, thyroid  normal.  Respiratory: Respiratory effort normal, BS equal bilaterally without rales, rhonchi, wheezing or stridor.  Cardio: RRR with no MRGs. Very slow pulse Abdomen: Soft, + BS.  + epigastric tenderness, no guarding, rebound, hernias, masses. Lymphatics: Non tender without lymphadenopathy.  Musculoskeletal: Full ROM, 5/5 strength, Normal gait Skin: Warm, dry without rashes, lesions, ecchymosis.  Neuro: Cranial nerves intact. No cerebellar symptoms. Essential tremor noticed of head- slightly more pronounced Psych: Awake and oriented X 3, normal affect, Insight and Judgment appropriate. PHQ 2 score 1     Lonell MICAEL Maryln CLARY [facility]  09/13/2023

## 2023-09-13 ENCOUNTER — Ambulatory Visit (INDEPENDENT_AMBULATORY_CARE_PROVIDER_SITE_OTHER): Payer: PPO | Admitting: Nurse Practitioner

## 2023-09-13 ENCOUNTER — Encounter: Payer: Self-pay | Admitting: Nurse Practitioner

## 2023-09-13 VITALS — BP 112/62 | HR 57 | Temp 97.5°F | Ht 64.0 in | Wt 105.6 lb

## 2023-09-13 DIAGNOSIS — E039 Hypothyroidism, unspecified: Secondary | ICD-10-CM | POA: Diagnosis not present

## 2023-09-13 DIAGNOSIS — R7309 Other abnormal glucose: Secondary | ICD-10-CM | POA: Diagnosis not present

## 2023-09-13 DIAGNOSIS — I700 Atherosclerosis of aorta: Secondary | ICD-10-CM | POA: Diagnosis not present

## 2023-09-13 DIAGNOSIS — C8313 Mantle cell lymphoma, intra-abdominal lymph nodes: Secondary | ICD-10-CM

## 2023-09-13 DIAGNOSIS — E782 Mixed hyperlipidemia: Secondary | ICD-10-CM

## 2023-09-13 DIAGNOSIS — N1831 Chronic kidney disease, stage 3a: Secondary | ICD-10-CM

## 2023-09-13 DIAGNOSIS — G4733 Obstructive sleep apnea (adult) (pediatric): Secondary | ICD-10-CM

## 2023-09-13 DIAGNOSIS — D801 Nonfamilial hypogammaglobulinemia: Secondary | ICD-10-CM

## 2023-09-13 DIAGNOSIS — G250 Essential tremor: Secondary | ICD-10-CM

## 2023-09-13 DIAGNOSIS — J4489 Other specified chronic obstructive pulmonary disease: Secondary | ICD-10-CM

## 2023-09-13 DIAGNOSIS — Z79899 Other long term (current) drug therapy: Secondary | ICD-10-CM

## 2023-09-13 DIAGNOSIS — J479 Bronchiectasis, uncomplicated: Secondary | ICD-10-CM | POA: Diagnosis not present

## 2023-09-13 DIAGNOSIS — R0989 Other specified symptoms and signs involving the circulatory and respiratory systems: Secondary | ICD-10-CM | POA: Diagnosis not present

## 2023-09-13 DIAGNOSIS — E559 Vitamin D deficiency, unspecified: Secondary | ICD-10-CM

## 2023-09-13 DIAGNOSIS — J449 Chronic obstructive pulmonary disease, unspecified: Secondary | ICD-10-CM

## 2023-09-13 DIAGNOSIS — F411 Generalized anxiety disorder: Secondary | ICD-10-CM

## 2023-09-13 NOTE — Patient Instructions (Signed)

## 2023-09-14 LAB — COMPLETE METABOLIC PANEL WITH GFR
AG Ratio: 2.5 (calc) (ref 1.0–2.5)
ALT: 17 U/L (ref 6–29)
AST: 28 U/L (ref 10–35)
Albumin: 4.2 g/dL (ref 3.6–5.1)
Alkaline phosphatase (APISO): 58 U/L (ref 37–153)
BUN/Creatinine Ratio: 18 (calc) (ref 6–22)
BUN: 23 mg/dL (ref 7–25)
CO2: 28 mmol/L (ref 20–32)
Calcium: 9.6 mg/dL (ref 8.6–10.4)
Chloride: 102 mmol/L (ref 98–110)
Creat: 1.31 mg/dL — ABNORMAL HIGH (ref 0.60–0.95)
Globulin: 1.7 g/dL — ABNORMAL LOW (ref 1.9–3.7)
Glucose, Bld: 80 mg/dL (ref 65–99)
Potassium: 4.4 mmol/L (ref 3.5–5.3)
Sodium: 140 mmol/L (ref 135–146)
Total Bilirubin: 0.7 mg/dL (ref 0.2–1.2)
Total Protein: 5.9 g/dL — ABNORMAL LOW (ref 6.1–8.1)
eGFR: 41 mL/min/{1.73_m2} — ABNORMAL LOW (ref >=60)

## 2023-09-14 LAB — LIPID PANEL
Cholesterol: 177 mg/dL (ref <200)
HDL: 67 mg/dL (ref >=50)
LDL Cholesterol (Calc): 91 mg/dL
Non-HDL Cholesterol (Calc): 110 mg/dL (ref <130)
Total CHOL/HDL Ratio: 2.6 (calc) (ref <5.0)
Triglycerides: 97 mg/dL (ref <150)

## 2023-09-14 LAB — CBC WITH DIFFERENTIAL/PLATELET
Absolute Lymphocytes: 1056 {cells}/uL (ref 850–3900)
Absolute Monocytes: 683 {cells}/uL (ref 200–950)
Basophils Absolute: 82 {cells}/uL (ref 0–200)
Basophils Relative: 1.6 %
Eosinophils Absolute: 122 {cells}/uL (ref 15–500)
Eosinophils Relative: 2.4 %
HCT: 41.2 % (ref 35.0–45.0)
Hemoglobin: 13.3 g/dL (ref 11.7–15.5)
MCH: 29.6 pg (ref 27.0–33.0)
MCHC: 32.3 g/dL (ref 32.0–36.0)
MCV: 91.8 fL (ref 80.0–100.0)
MPV: 10.9 fL (ref 7.5–12.5)
Monocytes Relative: 13.4 %
Neutro Abs: 3157 {cells}/uL (ref 1500–7800)
Neutrophils Relative %: 61.9 %
Platelets: 175 10*3/uL (ref 140–400)
RBC: 4.49 10*6/uL (ref 3.80–5.10)
RDW: 12.2 % (ref 11.0–15.0)
Total Lymphocyte: 20.7 %
WBC: 5.1 10*3/uL (ref 3.8–10.8)

## 2023-09-14 LAB — TSH: TSH: 1.19 m[IU]/L (ref 0.40–4.50)

## 2023-10-24 ENCOUNTER — Encounter: Payer: Self-pay | Admitting: Family Medicine

## 2023-10-24 ENCOUNTER — Ambulatory Visit (INDEPENDENT_AMBULATORY_CARE_PROVIDER_SITE_OTHER): Payer: PPO | Admitting: Family Medicine

## 2023-10-24 VITALS — BP 136/78 | HR 83 | Temp 98.6°F | Resp 18 | Ht 64.0 in | Wt 99.0 lb

## 2023-10-24 DIAGNOSIS — J40 Bronchitis, not specified as acute or chronic: Secondary | ICD-10-CM | POA: Diagnosis not present

## 2023-10-24 MED ORDER — PREDNISONE 20 MG PO TABS
40.0000 mg | ORAL_TABLET | Freq: Every day | ORAL | 0 refills | Status: AC
Start: 2023-10-24 — End: 2023-10-29

## 2023-10-24 NOTE — Progress Notes (Signed)
 Assessment & Plan:  1. Bronchitis (Primary) Education provided on bronchitis. Encouraged to use Albuterol  as needed for shortness of breath and feeling tight in the chest.  - predniSONE  (DELTASONE ) 20 MG tablet; Take 2 tablets (40 mg total) by mouth daily for 5 days.  Dispense: 10 tablet; Refill: 0   Follow up plan: Return if symptoms worsen or fail to improve.  Hershel Los, MSN, APRN, FNP-C  Subjective:  HPI: Shannon Parsons is a 83 y.o. female presenting on 10/24/2023 for Cough (Cough that started this morning, no fever, not productive. Chest feels tight and goes through the back- feels liker her "typically bronchitis" )  Patient complains of a nonproductive cough, shortness of breath, and chest tightness . She denies fever. Onset of symptoms was this morning. She is drinking plenty of fluids. Evaluation to date: none. Treatment to date: none. She has a history of COPD, Asthma, bronchiectasis, and chemotherapy-induced lung disease. She does not smoke.    ROS: Negative unless specifically indicated above in HPI.   Relevant past medical history reviewed and updated as indicated.   Allergies and medications reviewed and updated.   Current Outpatient Medications:    acetaminophen  (TYLENOL ) 325 MG tablet, Take 650 mg by mouth at bedtime as needed for mild pain., Disp: , Rfl:    albuterol  (VENTOLIN  HFA) 108 (90 Base) MCG/ACT inhaler, Inhale 2 puffs into the lungs every 6 (six) hours as needed for wheezing or shortness of breath., Disp: 6.7 g, Rfl: 0   Cholecalciferol (VITAMIN D3) 50 MCG (2000 UT) capsule, Take 4,000 Units by mouth daily. , Disp: , Rfl:    diazepam  (VALIUM ) 5 MG tablet, TAKE 1/2 TO 1 TABLET 3 TIMES A DAY FOR TREMOR, Disp: 270 tablet, Rfl: 1   hydroquinone 4 % cream, Apply 1 a small amount to affected area twice a day, Disp: , Rfl:    hyoscyamine  (LEVSIN SL) 0.125 MG SL tablet, Take    1 to 2 tablets     every 4 hours       as needed for Nausea, Cramping, Bloating or  Diarrhea DIARRHEA, Disp: 90 tablet, Rfl: 0   levothyroxine  (SYNTHROID ) 50 MCG tablet, TAKE 1 TABLET DAILY ON AN EMPTY STOMACH WITH ONLY WATER FOR 30 MINUTES. NO ANTACID MEDS, CALCIUM  OR MAGNESIUM FOR 4 HOURS AND AVOID BIOTIN, Disp: 90 tablet, Rfl: 3   Magnesium 250 MG TABS, Take 1 tablet by mouth as needed., Disp: , Rfl:    olmesartan  (BENICAR ) 40 MG tablet, Take 1 tablet Now & every Night for BP, Disp: 30 tablet, Rfl: 0   ondansetron  (ZOFRAN ) 8 MG tablet, Take 1/2 to 1 tablet 3 x /day (every 6 hours) if needed for Nausea, Disp: 30 tablet, Rfl: 0   rosuvastatin  (CRESTOR ) 5 MG tablet, TAKE 1/4 TABLET DAILY FOR CHOLESTEROL, Disp: 23 tablet, Rfl: 1   Spacer/Aero-Holding Chambers (EASIVENT MASK SMALL) MISC, See admin instructions., Disp: , Rfl:    vitamin C (ASCORBIC ACID) 500 MG tablet, Take 500 mg by mouth daily., Disp: , Rfl:    bisoprolol  (ZEBETA ) 5 MG tablet, Take 1 tablet (5 mg total) by mouth daily. Take 1 tablet Now & then every Morning for BP (Patient not taking: Reported on 10/24/2023), Disp: 30 tablet, Rfl: 0  Allergies  Allergen Reactions   Alendronate Other (See Comments)    "made me choke"  "made me choke", chokes   Risedronate Sodium Other (See Comments)    "made me choke"     Sulfa Antibiotics  Other (See Comments)    Doesn't remember    Sulfasalazine Other (See Comments)    Doesn't remember    Other Rash   Tetracyclines & Related Rash    Objective:   BP 136/78   Pulse 83   Temp 98.6 F (37 C)   Resp 18   Ht 5\' 4"  (1.626 m)   Wt 99 lb (44.9 kg)   LMP 09/14/2014   SpO2 95%   BMI 16.99 kg/m    Physical Exam Vitals reviewed.  Constitutional:      General: She is not in acute distress.    Appearance: Normal appearance. She is not ill-appearing, toxic-appearing or diaphoretic.  HENT:     Head: Normocephalic and atraumatic.     Right Ear: Tympanic membrane, ear canal and external ear normal. There is no impacted cerumen.     Left Ear: Tympanic membrane, ear canal  and external ear normal. There is no impacted cerumen.     Nose: Nose normal. No congestion or rhinorrhea.     Right Sinus: No maxillary sinus tenderness or frontal sinus tenderness.     Left Sinus: No maxillary sinus tenderness or frontal sinus tenderness.     Mouth/Throat:     Mouth: Mucous membranes are moist.     Pharynx: Oropharynx is clear. No oropharyngeal exudate or posterior oropharyngeal erythema.  Eyes:     General: No scleral icterus.       Right eye: No discharge.        Left eye: No discharge.     Conjunctiva/sclera: Conjunctivae normal.  Cardiovascular:     Rate and Rhythm: Normal rate and regular rhythm.     Heart sounds: Normal heart sounds. No murmur heard.    No friction rub. No gallop.  Pulmonary:     Effort: Pulmonary effort is normal. No respiratory distress.     Breath sounds: Normal breath sounds. No stridor. No wheezing, rhonchi or rales.  Musculoskeletal:        General: Normal range of motion.     Cervical back: Normal range of motion.  Lymphadenopathy:     Cervical: No cervical adenopathy.  Skin:    General: Skin is warm and dry.     Capillary Refill: Capillary refill takes less than 2 seconds.  Neurological:     General: No focal deficit present.     Mental Status: She is alert and oriented to person, place, and time. Mental status is at baseline.  Psychiatric:        Mood and Affect: Mood normal.        Behavior: Behavior normal.        Thought Content: Thought content normal.        Judgment: Judgment normal.

## 2023-10-24 NOTE — Addendum Note (Signed)
 Addended by: Zorita Hiss on: 10/24/2023 01:07 PM   Modules accepted: Level of Service

## 2023-10-25 ENCOUNTER — Ambulatory Visit: Payer: PPO | Admitting: Nurse Practitioner

## 2023-10-30 ENCOUNTER — Emergency Department (HOSPITAL_COMMUNITY)
Admission: EM | Admit: 2023-10-30 | Discharge: 2023-10-30 | Disposition: A | Discharge status: Home / Self Care | Payer: PPO | Attending: Emergency Medicine | Admitting: Emergency Medicine

## 2023-10-30 ENCOUNTER — Encounter (HOSPITAL_COMMUNITY): Payer: Self-pay | Admitting: *Deleted

## 2023-10-30 ENCOUNTER — Emergency Department (HOSPITAL_COMMUNITY): Payer: PPO

## 2023-10-30 ENCOUNTER — Other Ambulatory Visit: Payer: Self-pay

## 2023-10-30 DIAGNOSIS — R0602 Shortness of breath: Secondary | ICD-10-CM | POA: Diagnosis present

## 2023-10-30 DIAGNOSIS — I10 Essential (primary) hypertension: Secondary | ICD-10-CM | POA: Diagnosis not present

## 2023-10-30 DIAGNOSIS — R109 Unspecified abdominal pain: Secondary | ICD-10-CM | POA: Diagnosis not present

## 2023-10-30 DIAGNOSIS — R197 Diarrhea, unspecified: Secondary | ICD-10-CM | POA: Diagnosis not present

## 2023-10-30 DIAGNOSIS — Z7951 Long term (current) use of inhaled steroids: Secondary | ICD-10-CM | POA: Diagnosis not present

## 2023-10-30 DIAGNOSIS — C8590 Non-Hodgkin lymphoma, unspecified, unspecified site: Secondary | ICD-10-CM | POA: Diagnosis not present

## 2023-10-30 DIAGNOSIS — K5190 Ulcerative colitis, unspecified, without complications: Secondary | ICD-10-CM | POA: Insufficient documentation

## 2023-10-30 DIAGNOSIS — K5730 Diverticulosis of large intestine without perforation or abscess without bleeding: Secondary | ICD-10-CM | POA: Diagnosis not present

## 2023-10-30 DIAGNOSIS — R059 Cough, unspecified: Secondary | ICD-10-CM | POA: Diagnosis not present

## 2023-10-30 DIAGNOSIS — R531 Weakness: Secondary | ICD-10-CM | POA: Diagnosis not present

## 2023-10-30 DIAGNOSIS — J189 Pneumonia, unspecified organism: Secondary | ICD-10-CM | POA: Diagnosis not present

## 2023-10-30 DIAGNOSIS — K7689 Other specified diseases of liver: Secondary | ICD-10-CM | POA: Diagnosis not present

## 2023-10-30 DIAGNOSIS — K529 Noninfective gastroenteritis and colitis, unspecified: Secondary | ICD-10-CM | POA: Diagnosis not present

## 2023-10-30 DIAGNOSIS — J449 Chronic obstructive pulmonary disease, unspecified: Secondary | ICD-10-CM | POA: Diagnosis not present

## 2023-10-30 LAB — CBC WITH DIFFERENTIAL/PLATELET
Abs Immature Granulocytes: 0.02 10*3/uL (ref 0.00–0.07)
Basophils Absolute: 0 10*3/uL (ref 0.0–0.1)
Basophils Relative: 0 %
Eosinophils Absolute: 0 10*3/uL (ref 0.0–0.5)
Eosinophils Relative: 0 %
HCT: 51.4 % — ABNORMAL HIGH (ref 36.0–46.0)
Hemoglobin: 16.5 g/dL — ABNORMAL HIGH (ref 12.0–15.0)
Immature Granulocytes: 0 %
Lymphocytes Relative: 15 %
Lymphs Abs: 0.9 10*3/uL (ref 0.7–4.0)
MCH: 29 pg (ref 26.0–34.0)
MCHC: 32.1 g/dL (ref 30.0–36.0)
MCV: 90.3 fL (ref 80.0–100.0)
Monocytes Absolute: 0.9 10*3/uL (ref 0.1–1.0)
Monocytes Relative: 16 %
Neutro Abs: 3.8 10*3/uL (ref 1.7–7.7)
Neutrophils Relative %: 69 %
Platelets: 127 10*3/uL — ABNORMAL LOW (ref 150–400)
RBC: 5.69 MIL/uL — ABNORMAL HIGH (ref 3.87–5.11)
RDW: 14.1 % (ref 11.5–15.5)
WBC: 5.6 10*3/uL (ref 4.0–10.5)
nRBC: 0 % (ref 0.0–0.2)

## 2023-10-30 LAB — BASIC METABOLIC PANEL
Anion gap: 11 (ref 5–15)
BUN: 30 mg/dL — ABNORMAL HIGH (ref 8–23)
CO2: 26 mmol/L (ref 22–32)
Calcium: 9.2 mg/dL (ref 8.9–10.3)
Chloride: 98 mmol/L (ref 98–111)
Creatinine, Ser: 1.68 mg/dL — ABNORMAL HIGH (ref 0.44–1.00)
GFR, Estimated: 31 mL/min — ABNORMAL LOW (ref >60)
Glucose, Bld: 117 mg/dL — ABNORMAL HIGH (ref 70–99)
Potassium: 4.1 mmol/L (ref 3.5–5.1)
Sodium: 135 mmol/L (ref 135–145)

## 2023-10-30 LAB — HEPATIC FUNCTION PANEL
ALT: 27 U/L (ref 0–44)
AST: 53 U/L — ABNORMAL HIGH (ref 15–41)
Albumin: 4.3 g/dL (ref 3.5–5.0)
Alkaline Phosphatase: 51 U/L (ref 38–126)
Bilirubin, Direct: 0.1 mg/dL (ref 0.0–0.2)
Indirect Bilirubin: 0.6 mg/dL (ref 0.3–0.9)
Total Bilirubin: 0.7 mg/dL (ref 0.0–1.2)
Total Protein: 6.7 g/dL (ref 6.5–8.1)

## 2023-10-30 LAB — LIPASE, BLOOD: Lipase: 116 U/L — ABNORMAL HIGH (ref 11–51)

## 2023-10-30 LAB — RESP PANEL BY RT-PCR (RSV, FLU A&B, COVID)  RVPGX2
Influenza A by PCR: POSITIVE — AB
Influenza B by PCR: NEGATIVE
Resp Syncytial Virus by PCR: NEGATIVE
SARS Coronavirus 2 by RT PCR: NEGATIVE

## 2023-10-30 MED ORDER — AZITHROMYCIN 250 MG PO TABS: 250.0000 mg | ORAL_TABLET | Freq: Every day | ORAL | 0 refills | Status: DC

## 2023-10-30 MED ORDER — ALBUTEROL SULFATE (2.5 MG/3ML) 0.083% IN NEBU
5.0000 mg | INHALATION_SOLUTION | Freq: Once | RESPIRATORY_TRACT | Status: AC
  Administered 2023-10-30: 5 mg via RESPIRATORY_TRACT
  Filled 2023-10-30: qty 6

## 2023-10-30 MED ORDER — ALBUTEROL SULFATE HFA 108 (90 BASE) MCG/ACT IN AERS: 1.0000 | INHALATION_SPRAY | Freq: Four times a day (QID) | RESPIRATORY_TRACT | 0 refills | Status: AC | PRN

## 2023-10-30 MED ORDER — SODIUM CHLORIDE 0.9 % IV BOLUS
1000.0000 mL | Freq: Once | INTRAVENOUS | Status: AC
  Administered 2023-10-30: 1000 mL via INTRAVENOUS

## 2023-10-30 MED ORDER — SODIUM CHLORIDE 0.9 % IV SOLN
1.0000 g | Freq: Once | INTRAVENOUS | Status: AC
  Administered 2023-10-30: 1 g via INTRAVENOUS
  Filled 2023-10-30: qty 10

## 2023-10-30 MED ORDER — AMOXICILLIN-POT CLAVULANATE 875-125 MG PO TABS: 1.0000 | ORAL_TABLET | Freq: Two times a day (BID) | ORAL | 0 refills | Status: DC

## 2023-10-30 MED ORDER — IOHEXOL 300 MG/ML  SOLN
80.0000 mL | Freq: Once | INTRAMUSCULAR | Status: AC | PRN
  Administered 2023-10-30: 80 mL via INTRAVENOUS

## 2023-10-30 NOTE — ED Provider Notes (Signed)
[place] EMERGENCY DEPARTMENT AT [facility] Provider Note   CSN: [id number] Arrival date & time: 10/30/23  1541     History  Chief Complaint  Patient presents with   Diarrhea    Shannon Parsons is a 83 y.o. female history of hypertension, COPD, mantle cell lymphoma here presenting with shortness of breath and diarrhea.  Patient was diagnosed with bronchitis about a week ago.  Patient was started on a course of steroids.  Patient states that she been having diarrhea and abdominal cramps.  Denies any vomiting.  The history is provided by the patient.       Home Medications Prior to Admission medications   Medication Sig Start Date End Date Taking? Authorizing Provider  acetaminophen (TYLENOL) 325 MG tablet Take 650 mg by mouth at bedtime as needed for mild pain.    [provider]  albuterol (VENTOLIN HFA) 108 (90 Base) MCG/ACT inhaler Inhale 2 puffs into the lungs every 6 (six) hours as needed for wheezing or shortness of breath. 11/12/22   Raynelle Dick, NP  bisoprolol (ZEBETA) 5 MG tablet Take 1 tablet (5 mg total) by mouth daily. Take 1 tablet Now & then every Morning for BP Patient not taking: Reported on 10/24/2023 08/30/23   Lucky Cowboy, MD  Cholecalciferol (VITAMIN D3) 50 MCG (2000 UT) capsule Take 4,000 Units by mouth daily.     [provider]  diazepam (VALIUM) 5 MG tablet TAKE 1/2 TO 1 TABLET 3 TIMES A DAY FOR TREMOR 06/08/23   Lucky Cowboy, MD  hydroquinone 4 % cream Apply 1 a small amount to affected area twice a day 09/01/22   [provider]  hyoscyamine (LEVSIN SL) 0.125 MG SL tablet Take    1 to 2 tablets     every 4 hours       as needed for Nausea, Cramping, Bloating or Diarrhea DIARRHEA 05/13/22   Raynelle Dick, NP  levothyroxine (SYNTHROID) 50 MCG tablet TAKE 1 TABLET DAILY ON AN EMPTY STOMACH WITH ONLY WATER FOR 30 MINUTES. NO ANTACID MEDS, CALCIUM OR MAGNESIUM FOR 4 HOURS AND AVOID BIOTIN 04/22/23   Raynelle Dick, NP  Magnesium 250 MG TABS Take 1 tablet by mouth as needed.    [provider]  olmesartan (BENICAR) 40 MG tablet Take 1 tablet Now & every Night for BP 08/30/23   Lucky Cowboy, MD  ondansetron [employer]) 8 MG tablet Take 1/2 to 1 tablet 3 x /day (every 6 hours) if needed for Nausea 08/03/23   Lucky Cowboy, MD  rosuvastatin (CRESTOR) 5 MG tablet TAKE 1/4 TABLET DAILY FOR CHOLESTEROL 08/15/23   Raynelle Dick, NP  Spacer/Aero-Holding Chambers (EASIVENT MASK SMALL) MISC See admin instructions. 03/18/16   [provider]  vitamin C (ASCORBIC ACID) 500 MG tablet Take 500 mg by mouth daily.    [provider]      Allergies    Alendronate, Risedronate sodium, Sulfa antibiotics, Sulfasalazine, Other, and Tetracyclines & related    Review of Systems   Review of Systems  Respiratory:  Positive for cough.   Gastrointestinal:  Positive for diarrhea.  All other systems reviewed and are negative.   Physical Exam Updated Vital Signs BP (!) 132/59 (BP Location: Right Arm)   Pulse 94   Temp 98.9 F (37.2 C)   Resp 17   Wt 44.9 kg   LMP 09/14/2014   SpO2 94%   BMI 16.99 kg/m  Physical Exam Vitals and nursing  note reviewed.  Constitutional:      Comments: Chronically ill and slightly tachypneic  HENT:     Head: Normocephalic.     Nose: Nose normal.     Mouth/Throat:     Mouth: Mucous membranes are moist.  Eyes:     Extraocular Movements: Extraocular movements intact.     Pupils: Pupils are equal, round, and reactive to light.  Cardiovascular:     Rate and Rhythm: Normal rate and regular rhythm.     Pulses: Normal pulses.  Pulmonary:     Comments: Diminished bilaterally and minimal wheezing Abdominal:     General: Abdomen is flat.     Palpations: Abdomen is soft.     Comments: Mild diffuse tenderness but no rebound or guarding  Musculoskeletal:        General: Normal range of motion.     Cervical back: Normal range of motion and neck  supple.  Skin:    General: Skin is warm.     Capillary Refill: Capillary refill takes less than 2 seconds.  Neurological:     General: No focal deficit present.     Mental Status: She is alert and oriented to person, place, and time.  Psychiatric:        Mood and Affect: Mood normal.        Behavior: Behavior normal.     ED Results / Procedures / Treatments   Labs (all labs ordered are listed, but only abnormal results are displayed) Labs Reviewed  CBC WITH DIFFERENTIAL/PLATELET - Abnormal; Notable for the following components:      Result Value   RBC 5.69 (*)    Hemoglobin 16.5 (*)    HCT 51.4 (*)    Platelets 127 (*)    All other components within normal limits  BASIC METABOLIC PANEL - Abnormal; Notable for the following components:   Glucose, Bld 117 (*)    BUN 30 (*)    Creatinine, Ser 1.68 (*)    GFR, Estimated 31 (*)    All other components within normal limits  HEPATIC FUNCTION PANEL - Abnormal; Notable for the following components:   AST 53 (*)    All other components within normal limits  LIPASE, BLOOD - Abnormal; Notable for the following components:   Lipase 116 (*)    All other components within normal limits  RESP PANEL BY RT-PCR (RSV, FLU A&B, COVID)  [id number]    EKG None  Radiology CT ABDOMEN PELVIS W CONTRAST Result Date: 10/30/2023 CLINICAL DATA:  Abdominal pain, acute, nonlocalized. History of lymphoma EXAM: CT ABDOMEN AND PELVIS WITH CONTRAST TECHNIQUE: Multidetector CT imaging of the abdomen and pelvis was performed using the standard protocol following bolus administration of intravenous contrast. RADIATION DOSE REDUCTION: This exam was performed according to the departmental dose-optimization program which includes automated exposure control, adjustment of the mA and/or kV according to patient size and/or use of iterative reconstruction technique. CONTRAST:  80mL OMNIPAQUE IOHEXOL 300 MG/ML  SOLN COMPARISON:  09/20/2014 FINDINGS: Lower chest: Mild  bronchial wall thickening with subtle peribronchovascular nodularity in the bilateral lung bases. Hepatobiliary: Stable small hepatic cysts. No new focal liver abnormality. Unremarkable gallbladder. No hyperdense gallstone. No biliary dilatation. Pancreas: Unremarkable. No pancreatic ductal dilatation or surrounding inflammatory changes. Spleen: Normal in size without focal abnormality. Adrenals/Urinary Tract: Adrenal glands are unremarkable. Kidneys are normal, without renal calculi, focal lesion, or hydronephrosis. Extrarenal pelvis on the right. Bladder is unremarkable. Stomach/Bowel: Stomach within normal limits. No abnormally dilated loops of bowel.  Colonic diverticulosis. Long segment borderline-mild wall thickening of the sigmoid colon. No focally inflamed diverticulum is identified. Vascular/Lymphatic: Aortic atherosclerosis. No enlarged abdominal or pelvic lymph nodes. Reproductive: Uterus is atrophic or surgically absent. No adnexal masses. Other: Trace free fluid within the pelvis.  No free air. Musculoskeletal: Chronic bilateral L5 pars interarticularis defects with grade 1 anterolisthesis of L5 on S1. Chronic mild L5 compression fracture. No acute bony abnormality. IMPRESSION: 1. Colonic diverticulosis with long segment borderline-mild wall thickening of the sigmoid colon. No focally inflamed diverticulum is identified. Findings may represent a mild colitis. 2. Mild bronchial wall thickening with subtle peribronchovascular nodularity in the bilateral lung bases, suggestive of an infectious or inflammatory bronchiolitis. 3. Trace free fluid within the pelvis, likely reactive. 4. Aortic atherosclerosis (ICD10-I70.0). Electronically Signed   By: Duanne Guess D.O.   On: 10/30/2023 19:40   DG Chest Port 1 View Result Date: 10/30/2023 CLINICAL DATA:  Cough.  Diarrhea for 5 days. EXAM: PORTABLE CHEST 1 VIEW COMPARISON:  09/08/2022 FINDINGS: The cardiomediastinal contours are normal. Chronic  hyperinflation. Chronic interstitial coarsening. Pulmonary vasculature is normal. No consolidation, pleural effusion, or pneumothorax. No acute osseous abnormalities are seen. IMPRESSION: Chronic hyperinflation and interstitial coarsening. No acute findings. Electronically Signed   By: Narda Rutherford M.D.   On: 10/30/2023 18:09    Procedures Procedures    Medications Ordered in ED Medications  sodium chloride 0.9 % bolus 1,000 mL (0 mLs Intravenous Stopped 10/30/23 1834)  iohexol (OMNIPAQUE) 300 MG/ML solution 80 mL (80 mLs Intravenous Contrast Given 10/30/23 1844)  albuterol (PROVENTIL) (2.5 MG/3ML) 0.083% nebulizer solution 5 mg (5 mg Nebulization Given 10/30/23 2004)  cefTRIAXone (ROCEPHIN) 1 g in sodium chloride 0.9 % 100 mL IVPB (0 g Intravenous Stopped 10/30/23 2036)    ED Course/ Medical Decision Making/ A&P                                 Medical Decision Making AYLINE DINGUS is a 83 y.o. female recent bronchitis who just finished steroids here presenting with abdominal pain and diarrhea.  Consider colitis versus diverticulitis versus pneumonia.  Plan to get CBC and CMP and CT abdomen pelvis and chest x-ray  9:00 PM Patient had a brief episode of desat to 89%.  Her oxygen went back up after 1 neb treatment.  Now she is 94% on room air.  No wheezing on exam.  Labs showed mild AKI with creatinine of 1.6 today.  She also has a lipase of 116.  CT abdomen pelvis showed diverticulosis with possible mild colitis.  Patient also has some questionable pneumonia versus bronchiolitis on CT.  Patient given Rocephin.  Will discharge home with Augmentin (treat pneumonia and colitis) and azithromycin to cover atypical organisms.    Problems Addressed: Colitis: acute illness or injury Community acquired pneumonia, unspecified laterality: acute illness or injury  Amount and/or Complexity of Data Reviewed Labs: ordered. Decision-making details documented in ED Course. Radiology:  ordered.  Risk Prescription drug management.    Final Clinical Impression(s) / ED Diagnoses Final diagnoses:  None    Rx / DC Orders ED Discharge Orders     None         Charlynne Pander, MD 10/30/23 2102

## 2023-10-30 NOTE — ED Notes (Signed)
 Patient transported to CT

## 2023-10-30 NOTE — ED Notes (Signed)
Patient in room 02 sats 89-90% on room air placed on 1L Carmen and sats 93%

## 2023-10-30 NOTE — ED Triage Notes (Signed)
BIB [facility] from home for diarrhea x5 days. Menitons decreased appetite.  Denies pain, fever, bleeding, NV, dizziness, syncope or other sx, or recent travel, or recent ABT. No skick contacts. Lives with husband and he is w/o sx. Dx'd with bronchitis last week, and finished course of steroids. VSS. BS 117.

## 2023-10-30 NOTE — Discharge Instructions (Signed)
As we discussed, you have mild colitis and possible pneumonia  I have prescribed Augmentin twice daily for a week and also azithromycin  Take albuterol every 6 hours as needed  See your doctor for follow-up  Return to ER if you have worse cough or abdominal pain or vomiting

## 2023-10-30 NOTE — ED Notes (Signed)
Patient d/c with homecare instructions. Patient IV discontinued. Patient assisted in wheelchair and assisted in the car with husband present.

## 2023-11-01 ENCOUNTER — Encounter: Payer: Self-pay | Admitting: Internal Medicine

## 2023-11-01 ENCOUNTER — Ambulatory Visit (INDEPENDENT_AMBULATORY_CARE_PROVIDER_SITE_OTHER): Payer: PPO | Admitting: Internal Medicine

## 2023-11-01 VITALS — BP 130/86 | HR 84 | Temp 98.4°F | Ht 64.0 in | Wt 97.0 lb

## 2023-11-01 DIAGNOSIS — R251 Tremor, unspecified: Secondary | ICD-10-CM | POA: Diagnosis not present

## 2023-11-01 DIAGNOSIS — K589 Irritable bowel syndrome without diarrhea: Secondary | ICD-10-CM

## 2023-11-01 DIAGNOSIS — J101 Influenza due to other identified influenza virus with other respiratory manifestations: Secondary | ICD-10-CM | POA: Insufficient documentation

## 2023-11-01 DIAGNOSIS — J4489 Other specified chronic obstructive pulmonary disease: Secondary | ICD-10-CM

## 2023-11-01 MED ORDER — PROMETHAZINE-DM 6.25-15 MG/5ML PO SYRP: 5.0000 mL | ORAL_SOLUTION | Freq: Four times a day (QID) | ORAL | 0 refills | Status: DC | PRN

## 2023-11-01 NOTE — Assessment & Plan Note (Signed)
With normal CXR and possible inflammation in the lower lungs on CT abdomen and pelvis I am less sure of pneumonia. She is not taking augmentin due to size of pills. Have asked her to continue azithromycin and finish but do not take augmentin.

## 2023-11-01 NOTE — Assessment & Plan Note (Signed)
The ER was treating her for possible colitis on CT scan but in the setting of recent diarrhea prior to scan and influenza A I suspect that this is viral or reactive and antibiotics are not indicated. She is not taking augmentin and diarrhea and stomach symptoms have improved. Asked her not to start this.

## 2023-11-01 NOTE — Patient Instructions (Addendum)
We will have you finish the azithromycin until gone.   We have sent in cough medicine promethazine/dm to use up to 3 times a day for cough.

## 2023-11-01 NOTE — Progress Notes (Signed)
   Subjective:   Patient ID: Shannon Parsons, female    DOB: 09/06/1941, 83 y.o.   MRN: [id number]  Cough Pertinent negatives include no chest pain, shortness of breath or wheezing.   The patient is an 83 YO female coming in for ER follow up. Treated with steroids and did not improve by our office. Then to ER when she got CXR and CT abdomen and pelvis and labs. Started on 2 antibiotics. She had respiratory swab which came back positive for flu. She has not been taking augmentin as pills were too large for her to swallow. She has been taking azithromycin and using albuterol inhaler. She is having some shaking and worsening tremor since that time. She denies SOB. Having signicant cough still and nothing for cough. Her provider has passed away and would like to establish care as well for long term.  PMH, [facility], social history reviewed and updated at visit  Review of Systems  Constitutional:  Positive for activity change, appetite change and fatigue.  HENT:  Positive for congestion.   Eyes: Negative.   Respiratory:  Positive for cough. Negative for chest tightness, shortness of breath and wheezing.   Cardiovascular:  Negative for chest pain, palpitations and leg swelling.  Gastrointestinal:  Positive for diarrhea. Negative for abdominal distention, abdominal pain, constipation, nausea and vomiting.       Once per day  Musculoskeletal: Negative.   Skin: Negative.   Neurological:  Positive for tremors.  Psychiatric/Behavioral: Negative.      Objective:  Physical Exam Constitutional:      Appearance: She is well-developed.  HENT:     Head: Normocephalic and atraumatic.  Cardiovascular:     Rate and Rhythm: Normal rate and regular rhythm.  Pulmonary:     Effort: Pulmonary effort is normal. No respiratory distress.     Breath sounds: Normal breath sounds. No wheezing or rales.     Comments: Coughing during visit, speaking in full sentences Abdominal:     General: Bowel sounds are normal.  There is no distension.     Palpations: Abdomen is soft.     Tenderness: There is no abdominal tenderness. There is no rebound.  Musculoskeletal:     Cervical back: Normal range of motion.  Skin:    General: Skin is warm and dry.  Neurological:     Mental Status: She is alert and oriented to person, place, and time.     Coordination: Coordination normal.     Comments: Some tremor     Vitals:   11/01/23 1554  BP: 130/86  Pulse: 84  Temp: 98.4 F (36.9 C)  TempSrc: Oral  SpO2: 99%  Weight: 97 lb (44 kg)  Height: 5\' 4"  (1.626 m)    Assessment & Plan:  Visit time 25 minutes in face to face communication with patient and coordination of care, additional 20 minutes spent in record review, coordination or care, ordering tests, communicating/referring to other healthcare professionals, documenting in medical records all on the same day of the visit for total time 45 minutes spent on the visit.

## 2023-11-01 NOTE — Assessment & Plan Note (Signed)
She did not respond to steroids with this illness and not having SOB or wheezing currently. Will avoid further steroids as they could be contributing to her tremor. Also advised to continue albuterol prn but rx cough medicine to help promethazine/dm to help limit. She is getting most SOB symptoms with coughing fits. Close follow up needed 1-2 weeks.

## 2023-11-01 NOTE — Assessment & Plan Note (Signed)
Worse lately and I think the combination of prednisone and albuterol are triggering. She is also off beta blocker her prior PCP had tried with her. She thinks this gave her side effects. We will not treat today until back fully to baseline and then can consider treatment if needed and will review records to see what kind of tremor this is.

## 2023-11-08 ENCOUNTER — Ambulatory Visit: Payer: Self-pay | Admitting: Internal Medicine

## 2023-11-08 NOTE — Telephone Encounter (Signed)
  Chief Complaint: worsening influenza Symptoms: hypoxia, severe SOB, chest pain, back pain, fatigue, tachycardia Frequency: worsening x 3 days Disposition: [x] ED /[] Urgent Care (no appt availability in office) / [] Appointment(In office/virtual)/ []  [place] [facility]/ [] Home Care/ [] Refused Recommended Disposition /[] [place] [facility]/ []  Follow-up with PCP Additional Notes: Patient diagnosed with the flu on 11/01/23. She states the fatigue and SOB have worsened. Patient states she took her vitals and her pulse is 111 and SpO2 is 89-90% and denies home oxygen use. Advised patient to call 911 and go straight to ED. Patient refused and states she won't go back to the ED. She is requesting if the PCP can just send her some oxygen. Called [facility] and spoke with Tarah.  Copied from CRM [id number]. Topic: Clinical - Red Word Triage >> Nov 08, 2023  3:24 PM Isabelle Course C wrote: Red Word that prompted transfer to Nurse Triage: Patient stated that her O2 level is low and her pulse is 111 Reason for Disposition  SEVERE difficulty breathing (e.g., struggling for each breath, speaks in single words)  Answer Assessment - Initial Assessment Questions 1. RESPIRATORY STATUS: "Describe your breathing?" (e.g., wheezing, shortness of breath, unable to speak, severe coughing)      SOB and hypoxia.  2. ONSET: "When did this breathing problem begin?"      "All week"  3. PATTERN "Does the difficult breathing come and go, or has it been constant since it started?"      Constant and getting worse since 11/01/23.  4. SEVERITY: "How bad is your breathing?" (e.g., mild, moderate, severe)    - MILD: No SOB at rest, mild SOB with walking, speaks normally in sentences, can lie down, no retractions, pulse < 100.    - MODERATE: SOB at rest, SOB with minimal exertion and prefers to sit, cannot lie down flat, speaks in phrases, mild retractions, audible wheezing, pulse 100-120.    - SEVERE: Very SOB at rest, speaks in single  words, struggling to breathe, sitting hunched forward, retractions, pulse > 120      Severe, patient states it is worse with movement and feels SOB at rest and on the phone now.  5. RECURRENT SYMPTOM: "Have you had difficulty breathing before?" If Yes, ask: "When was the last time?" and "What happened that time?"      "No not like this".  6. CARDIAC HISTORY: "Do you have any history of heart disease?" (e.g., heart attack, angina, bypass surgery, angioplasty)      Denies.  7. LUNG HISTORY: "Do you have any history of lung disease?"  (e.g., pulmonary embolus, asthma, emphysema)     Asthma, bronchitis.  8. CAUSE: "What do you think is causing the breathing problem?"      Patient diagnosed with Flu A on 11/01/23.  9. OTHER SYMPTOMS: "Do you have any other symptoms? (e.g., dizziness, runny nose, cough, chest pain, fever)     Fatigue, tachycardia, chest pain, back pain.  10. O2 SATURATION MONITOR:  "Do you use an oxygen saturation monitor (pulse oximeter) at home?" If Yes, ask: "What is your reading (oxygen level) today?" "What is your usual oxygen saturation reading?" (e.g., 95%)       SpO2 89-90%.  Protocols used: Breathing Difficulty-A-AH

## 2023-11-10 ENCOUNTER — Ambulatory Visit: Payer: Self-pay | Admitting: Internal Medicine

## 2023-11-10 NOTE — Telephone Encounter (Signed)
 Pls see other encounter where this is being addressed

## 2023-11-10 NOTE — Telephone Encounter (Signed)
  Chief Complaint: SOB Symptoms: SOB, cough, dizziness Frequency: Around 10/30/23, admit for pneumonia Pertinent Negatives: Patient denies CP, fever Disposition: [x] ED /[] Urgent Care (no appt availability in office) / [] Appointment(In office/virtual)/ []  [place] [facility]/ [] Home Care/ [x] Refused Recommended Disposition /[]  Mobile Bus/ []  Follow-up with PCP Additional Notes: Patient called reporting wosening SOB, states she was recently hospitalized for pneumonia and feels she is not improving. During call, patient checked o2 saturation and was 87% at rest with HR108. Per protocol, patient to present to ED now for evaluation based on symptoms. Patient refused ED dispo, this RN offered to call EMS, patient refused. States she wants to see PCP. Patient reports she is home alone, this RN warm tx patient to [facility], connected with Delice Bison. Care advice reviewed. Alerting PCP for review and follow up.    Copied from CRM [id number]. Topic: Clinical - Red Word Triage >> Nov 10, 2023 10:54 AM Louie Casa B wrote: [facility] that prompted transfer to Nurse Triage: patient oxygen level is low and is dizzy Reason for Disposition  [1] MODERATE difficulty breathing (e.g., speaks in phrases, SOB even at rest, pulse 100-120) AND [2] NEW-onset or WORSE than normal  Answer Assessment - Initial Assessment Questions 1. RESPIRATORY STATUS: "Describe your breathing?" (e.g., wheezing, shortness of breath, unable to speak, severe coughing)      Wheezing, SOB- recently diagnosed with pneumonia 2. ONSET: "When did this breathing problem begin?"      10/30/23 3. PATTERN "Does the difficult breathing come and go, or has it been constant since it started?"      Constant 4. SEVERITY: "How bad is your breathing?" (e.g., mild, moderate, severe)    - MILD: No SOB at rest, mild SOB with walking, speaks normally in sentences, can lie down, no retractions, pulse < 100.    - MODERATE: SOB at rest, SOB with minimal exertion and  prefers to sit, cannot lie down flat, speaks in phrases, mild retractions, audible wheezing, pulse 100-120.    - SEVERE: Very SOB at rest, speaks in single words, struggling to breathe, sitting hunched forward, retractions, pulse > 120      Severe 5. RECURRENT SYMPTOM: "Have you had difficulty breathing before?" If Yes, ask: "When was the last time?" and "What happened that time?"      Denies 6. CARDIAC HISTORY: "Do you have any history of heart disease?" (e.g., heart attack, angina, bypass surgery, angioplasty)      Denies 7. LUNG HISTORY: "Do you have any history of lung disease?"  (e.g., pulmonary embolus, asthma, emphysema)     Asthma 8. CAUSE: "What do you think is causing the breathing problem?"      Pneumonia 9. OTHER SYMPTOMS: "Do you have any other symptoms? (e.g., dizziness, runny nose, cough, chest pain, fever)     Dizziness,  10. O2 SATURATION MONITOR:  "Do you use an oxygen saturation monitor (pulse oximeter) at home?" If Yes, ask: "What is your reading (oxygen level) today?" "What is your usual oxygen saturation reading?" (e.g., 95%)       87% o2, HR 108  Protocols used: Breathing Difficulty-A-AH

## 2023-11-10 NOTE — Telephone Encounter (Signed)
 We can't order oxygen for home without documentation of oxygen levels. It would be most appropriate to go to ER for this.

## 2023-11-10 NOTE — Telephone Encounter (Signed)
 I can call patient ad advise them that it would be appropriate to be seen att he ED

## 2023-11-11 ENCOUNTER — Other Ambulatory Visit: Payer: Self-pay

## 2023-11-11 ENCOUNTER — Emergency Department (HOSPITAL_COMMUNITY): Payer: PPO

## 2023-11-11 ENCOUNTER — Encounter (HOSPITAL_COMMUNITY): Payer: Self-pay

## 2023-11-11 ENCOUNTER — Observation Stay (HOSPITAL_COMMUNITY)
Admission: EM | Admit: 2023-11-11 | Discharge: 2023-11-12 | Disposition: A | Discharge status: Home / Self Care | Payer: PPO | Attending: Emergency Medicine | Admitting: Emergency Medicine

## 2023-11-11 DIAGNOSIS — I2510 Atherosclerotic heart disease of native coronary artery without angina pectoris: Secondary | ICD-10-CM | POA: Diagnosis not present

## 2023-11-11 DIAGNOSIS — R0602 Shortness of breath: Secondary | ICD-10-CM | POA: Diagnosis not present

## 2023-11-11 DIAGNOSIS — J45909 Unspecified asthma, uncomplicated: Secondary | ICD-10-CM | POA: Diagnosis not present

## 2023-11-11 DIAGNOSIS — Z79899 Other long term (current) drug therapy: Secondary | ICD-10-CM | POA: Insufficient documentation

## 2023-11-11 DIAGNOSIS — I700 Atherosclerosis of aorta: Secondary | ICD-10-CM | POA: Diagnosis not present

## 2023-11-11 DIAGNOSIS — J219 Acute bronchiolitis, unspecified: Principal | ICD-10-CM | POA: Diagnosis present

## 2023-11-11 DIAGNOSIS — E785 Hyperlipidemia, unspecified: Secondary | ICD-10-CM | POA: Diagnosis present

## 2023-11-11 DIAGNOSIS — E039 Hypothyroidism, unspecified: Secondary | ICD-10-CM | POA: Diagnosis not present

## 2023-11-11 DIAGNOSIS — J9601 Acute respiratory failure with hypoxia: Secondary | ICD-10-CM

## 2023-11-11 DIAGNOSIS — J441 Chronic obstructive pulmonary disease with (acute) exacerbation: Principal | ICD-10-CM | POA: Insufficient documentation

## 2023-11-11 DIAGNOSIS — R7989 Other specified abnormal findings of blood chemistry: Secondary | ICD-10-CM | POA: Diagnosis not present

## 2023-11-11 LAB — COMPREHENSIVE METABOLIC PANEL
ALT: 23 U/L (ref 0–44)
AST: 37 U/L (ref 15–41)
Albumin: 3.5 g/dL (ref 3.5–5.0)
Alkaline Phosphatase: 49 U/L (ref 38–126)
Anion gap: 9 (ref 5–15)
BUN: 15 mg/dL (ref 8–23)
CO2: 24 mmol/L (ref 22–32)
Calcium: 9.4 mg/dL (ref 8.9–10.3)
Chloride: 104 mmol/L (ref 98–111)
Creatinine, Ser: 1.01 mg/dL — ABNORMAL HIGH (ref 0.44–1.00)
GFR, Estimated: 56 mL/min — ABNORMAL LOW (ref >60)
Glucose, Bld: 99 mg/dL (ref 70–99)
Potassium: 3.8 mmol/L (ref 3.5–5.1)
Sodium: 137 mmol/L (ref 135–145)
Total Bilirubin: 1.1 mg/dL (ref 0.0–1.2)
Total Protein: 6.1 g/dL — ABNORMAL LOW (ref 6.5–8.1)

## 2023-11-11 LAB — URINALYSIS, W/ REFLEX TO CULTURE (INFECTION SUSPECTED)
Bacteria, UA: NONE SEEN
Bilirubin Urine: NEGATIVE
Glucose, UA: NEGATIVE mg/dL
Hgb urine dipstick: NEGATIVE
Ketones, ur: NEGATIVE mg/dL
Leukocytes,Ua: NEGATIVE
Nitrite: NEGATIVE
Protein, ur: 30 mg/dL — AB
Specific Gravity, Urine: 1.015 (ref 1.005–1.030)
pH: 7 (ref 5.0–8.0)

## 2023-11-11 LAB — CBC WITH DIFFERENTIAL/PLATELET
Abs Immature Granulocytes: 0.02 10*3/uL (ref 0.00–0.07)
Basophils Absolute: 0 10*3/uL (ref 0.0–0.1)
Basophils Relative: 1 %
Eosinophils Absolute: 0.1 10*3/uL (ref 0.0–0.5)
Eosinophils Relative: 1 %
HCT: 44.9 % (ref 36.0–46.0)
Hemoglobin: 14.7 g/dL (ref 12.0–15.0)
Immature Granulocytes: 0 %
Lymphocytes Relative: 21 %
Lymphs Abs: 1.2 10*3/uL (ref 0.7–4.0)
MCH: 28.8 pg (ref 26.0–34.0)
MCHC: 32.7 g/dL (ref 30.0–36.0)
MCV: 88 fL (ref 80.0–100.0)
Monocytes Absolute: 0.6 10*3/uL (ref 0.1–1.0)
Monocytes Relative: 11 %
Neutro Abs: 3.7 10*3/uL (ref 1.7–7.7)
Neutrophils Relative %: 66 %
Platelets: 257 10*3/uL (ref 150–400)
RBC: 5.1 MIL/uL (ref 3.87–5.11)
RDW: 13.8 % (ref 11.5–15.5)
WBC: 5.6 10*3/uL (ref 4.0–10.5)
nRBC: 0 % (ref 0.0–0.2)

## 2023-11-11 LAB — I-STAT CG4 LACTIC ACID, ED: Lactic Acid, Venous: 0.6 mmol/L (ref 0.5–1.9)

## 2023-11-11 MED ORDER — IOHEXOL 350 MG/ML SOLN
80.0000 mL | Freq: Once | INTRAVENOUS | Status: AC | PRN
Start: 2023-11-11 — End: 2023-11-12
  Administered 2023-11-12: 80 mL via INTRAVENOUS

## 2023-11-11 MED ORDER — METHYLPREDNISOLONE SODIUM SUCC 125 MG IJ SOLR
125.0000 mg | Freq: Once | INTRAMUSCULAR | Status: AC
  Administered 2023-11-12: 125 mg via INTRAVENOUS
  Filled 2023-11-11: qty 2

## 2023-11-11 MED ORDER — SODIUM CHLORIDE (PF) 0.9 % IJ SOLN
INTRAMUSCULAR | Status: AC
Start: 2023-11-11 — End: ?
  Filled 2023-11-11: qty 50

## 2023-11-11 MED ORDER — IPRATROPIUM-ALBUTEROL 0.5-2.5 (3) MG/3ML IN SOLN
3.0000 mL | Freq: Once | RESPIRATORY_TRACT | Status: AC
  Administered 2023-11-12: 3 mL via RESPIRATORY_TRACT
  Filled 2023-11-11: qty 3

## 2023-11-11 NOTE — ED Provider Triage Note (Signed)
 Emergency Medicine Provider Triage Evaluation Note  Shannon Parsons , a 83 y.o. female  was evaluated in triage.  Pt complains of sob. Report being diagnosed with bronchitis asthma pneumonia 3 weeks ago, had antibiotic but not getting any better.  Report increased sob.  No fever, chills, n/v/d, or hemoptysis  Review of Systems  Positive: As above Negative: As above  Physical Exam  BP (!) 149/88 (BP Location: Left Arm)   Pulse (!) 107   Temp 97.8 F (36.6 C) (Oral)   Resp 18   Ht 5\' 4"  (1.626 m)   Wt 41.7 kg   LMP 09/14/2014   SpO2 97%   BMI 15.79 kg/m  Gen:   Awake, no distress   Resp:  Normal effort  MSK:   Moves extremities without difficulty  Other:    Medical Decision Making  Medically screening exam initiated at 6:25 PM.  Appropriate orders placed.  Franki Monte was informed that the remainder of the evaluation will be completed by another provider, this initial triage assessment does not replace that evaluation, and the importance of remaining in the ED until their evaluation is complete.     Fayrene Helper, PA-C 11/11/23 [phone]

## 2023-11-11 NOTE — ED Triage Notes (Signed)
 Pt presents with continued ShOB x 3 weeks. Pt has been seen by PCP and was dx with pneumonia. Pt took the abx, but reports she has not improved. Pt reports the ShOB limits her ability to do any movements. Pt has a deep congested and productive cough in triage. Pt with a hx of asthma.

## 2023-11-11 NOTE — Telephone Encounter (Signed)
 Called patient and informed her that she would to go to ER regarding this

## 2023-11-12 DIAGNOSIS — I2510 Atherosclerotic heart disease of native coronary artery without angina pectoris: Secondary | ICD-10-CM | POA: Diagnosis not present

## 2023-11-12 DIAGNOSIS — R7989 Other specified abnormal findings of blood chemistry: Secondary | ICD-10-CM | POA: Diagnosis not present

## 2023-11-12 DIAGNOSIS — I700 Atherosclerosis of aorta: Secondary | ICD-10-CM | POA: Diagnosis not present

## 2023-11-12 DIAGNOSIS — J219 Acute bronchiolitis, unspecified: Principal | ICD-10-CM | POA: Diagnosis present

## 2023-11-12 DIAGNOSIS — J9601 Acute respiratory failure with hypoxia: Secondary | ICD-10-CM

## 2023-11-12 LAB — RESP PANEL BY RT-PCR (RSV, FLU A&B, COVID)  RVPGX2
Influenza A by PCR: NEGATIVE
Influenza B by PCR: NEGATIVE
Resp Syncytial Virus by PCR: NEGATIVE
SARS Coronavirus 2 by RT PCR: NEGATIVE

## 2023-11-12 LAB — PROCALCITONIN: Procalcitonin: <0.1 ng/mL

## 2023-11-12 MED ORDER — IPRATROPIUM-ALBUTEROL 0.5-2.5 (3) MG/3ML IN SOLN: 3.0000 mL | RESPIRATORY_TRACT | Status: DC | PRN

## 2023-11-12 MED ORDER — ROSUVASTATIN CALCIUM 5 MG PO TABS
2.5000 mg | ORAL_TABLET | ORAL | Status: DC
Start: 2023-11-12 — End: 2023-11-12
  Filled 2023-11-12: qty 0.5

## 2023-11-12 MED ORDER — ACETAMINOPHEN 325 MG PO TABS: 650.0000 mg | ORAL_TABLET | Freq: Four times a day (QID) | ORAL | Status: DC | PRN

## 2023-11-12 MED ORDER — SODIUM CHLORIDE 0.9 % IV SOLN
500.0000 mg | Freq: Once | INTRAVENOUS | Status: AC
  Administered 2023-11-12: 500 mg via INTRAVENOUS
  Filled 2023-11-12: qty 5

## 2023-11-12 MED ORDER — PREDNISONE 50 MG PO TABS: 50.0000 mg | ORAL_TABLET | Freq: Every day | ORAL | 0 refills | Status: AC

## 2023-11-12 MED ORDER — SODIUM CHLORIDE 0.9 % IV SOLN: 500.0000 mg | INTRAVENOUS | Status: DC

## 2023-11-12 MED ORDER — SODIUM CHLORIDE 0.9 % IV SOLN: 1.0000 g | INTRAVENOUS | Status: DC

## 2023-11-12 MED ORDER — ENOXAPARIN SODIUM 30 MG/0.3ML IJ SOSY
30.0000 mg | PREFILLED_SYRINGE | INTRAMUSCULAR | Status: DC
  Filled 2023-11-12: qty 0.3

## 2023-11-12 MED ORDER — LEVOTHYROXINE SODIUM 50 MCG PO TABS
50.0000 ug | ORAL_TABLET | Freq: Every day | ORAL | Status: DC
  Filled 2023-11-12: qty 1

## 2023-11-12 MED ORDER — SODIUM CHLORIDE 0.9 % IV SOLN
1.0000 g | Freq: Once | INTRAVENOUS | Status: AC
  Administered 2023-11-12: 1 g via INTRAVENOUS
  Filled 2023-11-12: qty 10

## 2023-11-12 MED ORDER — DM-GUAIFENESIN ER 30-600 MG PO TB12: 1.0000 | ORAL_TABLET | Freq: Two times a day (BID) | ORAL | Status: DC | PRN

## 2023-11-12 MED ORDER — IPRATROPIUM-ALBUTEROL 0.5-2.5 (3) MG/3ML IN SOLN
3.0000 mL | Freq: Once | RESPIRATORY_TRACT | Status: AC
  Administered 2023-11-12: 3 mL via RESPIRATORY_TRACT
  Filled 2023-11-12: qty 3

## 2023-11-12 MED ORDER — ACETAMINOPHEN 650 MG RE SUPP: 650.0000 mg | Freq: Four times a day (QID) | RECTAL | Status: DC | PRN

## 2023-11-12 NOTE — ED Notes (Signed)
 Patient spoke to NP [facility] and agreed to stay

## 2023-11-12 NOTE — H&P (Signed)
 History and Physical    Shannon Parsons ZOX:[id number] DOB: [birth year] DOA: 11/11/2023  PCP: Myrlene Broker, MD  Patient coming from: Home  Chief Complaint: Shortness of breath  HPI: Shannon Parsons is a 83 y.o. female with medical history significant of asthma, GERD, hyperlipidemia, mantle cell lymphoma, melanoma, OSA, hypothyroidism, prediabetes.  Patient was recently seen by PCP on 2/10 for cough and shortness of breath.  She was diagnosed with bronchitis and prescribed prednisone.  Seen in the ED on 2/16 for abdominal pain and diarrhea.  CT showed possible mild colitis and bronchiolitis.  She was given Rocephin and discharged home on Augmentin and azithromycin.  She had tested positive for influenza A during this ED visit.  Patient returns to the ED complaining of ongoing shortness of breath, cough, and congestion despite finishing outpatient antibiotic course.  Unclear how hypoxic patient was in the ED, she was placed on 2 L supplemental oxygen.  Afebrile.  Labs showing no leukocytosis, lactic acid normal, COVID/influenza/RSV PCR negative.  CTA chest negative for PE but showing infectious/inflammatory bronchiolitis. Patient was given DuoNeb x 2, Solu-Medrol 125 mg, ceftriaxone, and azithromycin.  [facility] called to admit.  Patient states she has continued to have shortness of breath for the past 3 weeks despite taking steroids, antibiotics, and using albuterol inhaler.  Also had mild wheezing and cough seems to have improved.  Denies fevers or chills.  Denies vomiting, abdominal pain, or diarrhea.  No other complaints.  Review of Systems:  Review of Systems  All other systems reviewed and are negative.   Past Medical History:  Diagnosis Date   Adenomatous colon polyp 1994   Asthma    Cataract 2013   bilateral eyes   Diverticulosis    Family history of ischemic heart disease    Fibrocystic breast disease    GERD (gastroesophageal reflux disease)    Hiatal hernia     Hyperlipidemia    IBS (irritable bowel syndrome)    Internal hemorrhoids    Mantle cell lymphoma (HCC) 10/18/2014   Melanoma (HCC)    Facial, pt denies on 01/16/18   OSA (obstructive sleep apnea)    Osteoarthritis    Osteoporosis    Prediabetes    Unspecified hypothyroidism    Vitamin D deficiency     Past Surgical History:  Procedure Laterality Date   APPENDECTOMY     BUNIONECTOMY     CATARACT EXTRACTION, BILATERAL     IR REMOVAL TUN ACCESS W/ PORT W/O FL MOD SED  03/04/2020   KNEE ARTHROSCOPY     TONSILLECTOMY     UMBILICAL HERNIA REPAIR     VIDEO BRONCHOSCOPY Bilateral 10/12/2017   Procedure: VIDEO BRONCHOSCOPY WITHOUT FLUORO;  Surgeon: Nyoka Cowden, MD;  Location: [facility];  Service: Endoscopy;  Laterality: Bilateral;     reports that she has never smoked. She has never used smokeless tobacco. She reports that she does not drink alcohol and does not use drugs.  Allergies  Allergen Reactions   Alendronate Other (See Comments)    "made me choke"  "made me choke", chokes   Risedronate Sodium Other (See Comments)    "made me choke"     Sulfa Antibiotics Other (See Comments)    Doesn't remember    Sulfasalazine Other (See Comments)    Doesn't remember    Other Rash   Tetracyclines & Related Rash    Family History  Problem Relation Age of Onset   Hypertension Mother    Thyroid disease  Mother    Emphysema Father    Heart disease Father    Heart attack Father 41       Died   Rheum arthritis Sister    Heart disease Brother 80   Colon cancer Neg Hx    Stomach cancer Neg Hx    Sleep apnea Neg Hx     Prior to Admission medications   Medication Sig Start Date End Date Taking? Authorizing Provider  acetaminophen (TYLENOL) 325 MG tablet Take 650 mg by mouth at bedtime as needed for mild pain.   Yes [provider]  albuterol (VENTOLIN HFA) 108 (90 Base) MCG/ACT inhaler Inhale 1-2 puffs into the lungs every 6 (six) hours as needed for wheezing or  shortness of breath. 10/30/23  Yes Charlynne Pander, MD  Cholecalciferol (VITAMIN D3) 50 MCG (2000 UT) capsule Take 4,000 Units by mouth daily.    Yes [provider]  levothyroxine (SYNTHROID) 50 MCG tablet TAKE 1 TABLET DAILY ON AN EMPTY STOMACH WITH ONLY WATER FOR 30 MINUTES. NO ANTACID MEDS, CALCIUM OR MAGNESIUM FOR 4 HOURS AND AVOID BIOTIN 04/22/23  Yes Raynelle Dick, NP  Magnesium 250 MG TABS Take 1 tablet by mouth daily as needed.   Yes [provider]  azithromycin (ZITHROMAX) 250 MG tablet Take 1 tablet (250 mg total) by mouth daily. Take first 2 tablets together, then 1 every day until finished. Patient not taking: Reported on 11/12/2023 10/30/23   Charlynne Pander, MD  diazepam (VALIUM) 5 MG tablet TAKE 1/2 TO 1 TABLET 3 TIMES A DAY FOR TREMOR Patient not taking: Reported on 11/12/2023 06/08/23   Lucky Cowboy, MD  hydroquinone 4 % cream Apply 1 a small amount to affected area twice a day Patient not taking: Reported on 11/12/2023 09/01/22   [provider]  hyoscyamine (LEVSIN SL) 0.125 MG SL tablet Take    1 to 2 tablets     every 4 hours       as needed for Nausea, Cramping, Bloating or Diarrhea DIARRHEA 05/13/22  Yes Raynelle Dick, NP  ondansetron (ZOFRAN) 8 MG tablet Take 1/2 to 1 tablet 3 x /day (every 6 hours) if needed for Nausea 08/03/23  Yes Lucky Cowboy, MD  promethazine-dextromethorphan (PROMETHAZINE-DM) 6.25-15 MG/5ML syrup Take 5 mLs by mouth 4 (four) times daily as needed. 11/01/23  Yes Myrlene Broker, MD  rosuvastatin (CRESTOR) 5 MG tablet TAKE 1/4 TABLET DAILY FOR CHOLESTEROL 08/15/23  Yes Raynelle Dick, NP  Spacer/Aero-Holding Chambers (EASIVENT MASK SMALL) MISC See admin instructions. 03/18/16   [provider]  vitamin C (ASCORBIC ACID) 500 MG tablet Take 500 mg by mouth every other day.   Yes [provider]    Physical Exam: Vitals:   11/12/23 0000 11/12/23 0045 11/12/23 0053 11/12/23 0100  BP: (!) 165/96  (!) 156/85  (!) 149/71  Pulse: 94 86  82  Resp: (!) 22 17  (!) 21  Temp:   98.1 F (36.7 C)   TempSrc:   Oral   SpO2: 96% 94%  90%  Weight:      Height:        Physical Exam Vitals reviewed.  Constitutional:      General: She is not in acute distress. HENT:     Head: Normocephalic and atraumatic.  Eyes:     Extraocular Movements: Extraocular movements intact.  Cardiovascular:     Rate and Rhythm: Normal rate and regular rhythm.     Pulses: Normal pulses.  Pulmonary:     Effort: Pulmonary effort is normal. No respiratory distress.     Breath sounds: No wheezing, rhonchi or rales.  Abdominal:     General: Bowel sounds are normal. There is no distension.     Palpations: Abdomen is soft.     Tenderness: There is no abdominal tenderness. There is no guarding or rebound.  Musculoskeletal:     Cervical back: Normal range of motion.     Right lower leg: No edema.     Left lower leg: No edema.  Skin:    General: Skin is warm and dry.  Neurological:     General: No focal deficit present.     Mental Status: She is alert and oriented to person, place, and time.     Labs on Admission: I have personally reviewed following labs and imaging studies  CBC: Recent Labs  Lab 11/11/23 1834  WBC 5.6  NEUTROABS 3.7  HGB 14.7  HCT 44.9  MCV 88.0  PLT 257   Basic Metabolic Panel: Recent Labs  Lab 11/11/23 1834  NA 137  K 3.8  CL 104  CO2 24  GLUCOSE 99  BUN 15  CREATININE 1.01*  CALCIUM 9.4   GFR: Estimated Creatinine Clearance: 29.2 mL/min (A) (by C-G formula based on SCr of 1.01 mg/dL (H)). Liver Function Tests: Recent Labs  Lab 11/11/23 1834  AST 37  ALT 23  ALKPHOS 49  BILITOT 1.1  PROT 6.1*  ALBUMIN 3.5   No results for input(s): "LIPASE", "AMYLASE" in the last 168 hours. No results for input(s): "AMMONIA" in the last 168 hours. Coagulation Profile: No results for input(s): "INR", "PROTIME" in the last 168 hours. Cardiac Enzymes: No results for  input(s): "CKTOTAL", "CKMB", "CKMBINDEX", "TROPONINI" in the last 168 hours. BNP (last 3 results) No results for input(s): "PROBNP" in the last 8760 hours. HbA1C: No results for input(s): "HGBA1C" in the last 72 hours. CBG: No results for input(s): "GLUCAP" in the last 168 hours. Lipid Profile: No results for input(s): "CHOL", "HDL", "LDLCALC", "TRIG", "CHOLHDL", "LDLDIRECT" in the last 72 hours. Thyroid Function Tests: No results for input(s): "TSH", "T4TOTAL", "FREET4", "T3FREE", "THYROIDAB" in the last 72 hours. Anemia Panel: No results for input(s): "VITAMINB12", "FOLATE", "FERRITIN", "TIBC", "IRON", "RETICCTPCT" in the last 72 hours. Urine analysis:    Component Value Date/Time   COLORURINE YELLOW 11/11/2023 2034   APPEARANCEUR CLEAR 11/11/2023 2034   LABSPEC 1.015 11/11/2023 2034   LABSPEC 1.010 02/06/2015 0953   PHURINE 7.0 11/11/2023 2034   GLUCOSEU NEGATIVE 11/11/2023 2034   HGBUR NEGATIVE 11/11/2023 2034   BILIRUBINUR NEGATIVE 11/11/2023 2034   KETONESUR NEGATIVE 11/11/2023 2034   PROTEINUR 30 (A) 11/11/2023 2034   UROBILINOGEN 0.2 04/02/2015 1511   UROBILINOGEN 0.2 02/06/2015 0953   NITRITE NEGATIVE 11/11/2023 2034   LEUKOCYTESUR NEGATIVE 11/11/2023 2034    Radiological Exams on Admission: CT Angio Chest PE W/Cm &/Or Wo Cm Result Date: 11/12/2023 CLINICAL DATA:  Positive D-dimer. Breathing treatment. Shortness of breath. EXAM: CT ANGIOGRAPHY CHEST WITH CONTRAST TECHNIQUE: Multidetector CT imaging of the chest was performed using the standard protocol during bolus administration of intravenous contrast. Multiplanar CT image reconstructions and MIPs were obtained to evaluate the vascular anatomy. RADIATION DOSE REDUCTION: This exam was performed according to the departmental dose-optimization program which includes automated exposure control, adjustment of the mA and/or kV according to patient size and/or use of iterative reconstruction technique. CONTRAST:  80mL OMNIPAQUE  IOHEXOL 350 MG/ML SOLN COMPARISON:  Chest radiograph 11/11/2023  and CTA chest 03/27/2021 FINDINGS: Cardiovascular: Negative for acute pulmonary embolism. No pericardial effusion. Coronary artery and aortic atherosclerotic calcification. Mediastinum/Nodes: Trachea is unremarkable. Unremarkable esophagus. No thoracic adenopathy. Lungs/Pleura: Bilateral scattered centrilobular micro nodules and tree-in-bud opacities. Bibasilar atelectasis/scarring. No pleural effusion or pneumothorax. Biapical pleural-parenchymal scarring. Upper Abdomen: No acute abnormality. Musculoskeletal: No acute fracture. Review of the MIP images confirms the above findings. IMPRESSION: 1. Negative for acute pulmonary embolism. 2. Infectious/inflammatory bronchiolitis. 3. Aortic Atherosclerosis (ICD10-I70.0). Electronically Signed   By: Minerva Fester M.D.   On: 11/12/2023 01:11   DG Chest 2 View Result Date: 11/11/2023 CLINICAL DATA:  Shortness of breath for 3 weeks EXAM: CHEST - 2 VIEW COMPARISON:  10/30/2023 FINDINGS: Cardiac shadow is within normal limits. Patient is somewhat rotated to the right accentuating the mediastinal markings. Aortic calcifications are seen. No acute infiltrate or effusion is seen. No bony abnormality is noted. IMPRESSION: No active cardiopulmonary disease. Electronically Signed   By: Alcide Clever M.D.   On: 11/11/2023 19:11    EKG: Independently reviewed.  Sinus tachycardia, artifact.  Assessment and Plan  Acute hypoxemic respiratory failure secondary to bronchiolitis CTA chest negative for PE but showing infectious/inflammatory bronchiolitis.  No fever or leukocytosis.  Lactic acid normal.  COVID/influenza/RSV PCR negative.  Patient is maintaining sats in the 90s on 1-2 L [place], no respiratory distress.  She was given IV steroids in the ED and no longer wheezing.  Continue antibiotics and check procalcitonin level.  Continue bronchodilator PRN.  Mucinex-DM PRN.   Hyperlipidemia Continue  Crestor.  Hypothyroidism Continue Synthroid.  DVT prophylaxis: Lovenox Code Status: Full Code (discussed with the patient) Family Communication: No family available at this time. Level of care: Telemetry bed Admission status: It is my clinical opinion that referral for OBSERVATION is reasonable and necessary in this patient based on the above information provided. The aforementioned taken together are felt to place the patient at high risk for further clinical deterioration. However, it is anticipated that the patient may be medically stable for discharge from the hospital within 24 to 48 hours.  John Giovanni MD [facility]  If 7PM-7AM, please contact night-coverage [email or web address]  11/12/2023, 3:29 AM

## 2023-11-12 NOTE — ED Notes (Signed)
 Titrated pt O2 down to 2L sats maintaining at 100

## 2023-11-12 NOTE — ED Notes (Signed)
 Pt wants to know if there any breathing exercises she can do

## 2023-11-12 NOTE — ED Notes (Signed)
 Patient ambulated to bathroom.

## 2023-11-12 NOTE — ED Notes (Signed)
 Pt ambulated to the bathroom and back to room and never dropped under 90%

## 2023-11-12 NOTE — Progress Notes (Signed)
     Patient Name: Shannon Parsons           DOB: 1941/02/16  MRN: [id number]      Admission Date: 11/11/2023  Attending Provider: Hughie Closs, MD  Primary Diagnosis: Bronchiolitis   Level of care: Telemetry   OVERNIGHT PROGRESS REPORT   Per bedside RN, patient is wanting to leave and go home.    I spoke to patient over the phone regarding this.  She states she feels better, however still requiring 1-2 L oxygen.  I educated patient that safe discharge is important. Considering that she is still requiring oxygen, I could not recommend discharge without proper equipment.  She understands this and is now agreeable to being admitted.    Anthoney Harada, DNP, ACNPC- AG [facility]

## 2023-11-12 NOTE — ED Notes (Signed)
 Patient stated that she wanted to go home and not get admitted. That she could manage at home. Made floor coverage and attending aware

## 2023-11-12 NOTE — ED Provider Notes (Signed)
 [place] EMERGENCY DEPARTMENT AT [facility] Provider Note   CSN: [id number] Arrival date & time: 11/11/23  1751     History  Chief Complaint  Patient presents with   Shortness of Breath    Shannon Parsons is a 83 y.o. female.  HPI   83 year old female presents emergency department with ongoing shortness of breath.  Had been diagnosed with bronchitis pneumonia couple weeks ago, completed antibiotics without any significant improvement.  Continues with productive cough, fatigue.  Denies any hemoptysis.  No active chest or back pain.  No leg swelling.  Home Medications Prior to Admission medications   Medication Sig Start Date End Date Taking? Authorizing Provider  acetaminophen (TYLENOL) 325 MG tablet Take 650 mg by mouth at bedtime as needed for mild pain.    [provider]  albuterol (VENTOLIN HFA) 108 (90 Base) MCG/ACT inhaler Inhale 1-2 puffs into the lungs every 6 (six) hours as needed for wheezing or shortness of breath. 10/30/23   Charlynne Pander, MD  azithromycin (ZITHROMAX) 250 MG tablet Take 1 tablet (250 mg total) by mouth daily. Take first 2 tablets together, then 1 every day until finished. 10/30/23   Charlynne Pander, MD  Cholecalciferol (VITAMIN D3) 50 MCG (2000 UT) capsule Take 4,000 Units by mouth daily.     [provider]  diazepam (VALIUM) 5 MG tablet TAKE 1/2 TO 1 TABLET 3 TIMES A DAY FOR TREMOR 06/08/23   Lucky Cowboy, MD  hydroquinone 4 % cream Apply 1 a small amount to affected area twice a day 09/01/22   [provider]  hyoscyamine (LEVSIN SL) 0.125 MG SL tablet Take    1 to 2 tablets     every 4 hours       as needed for Nausea, Cramping, Bloating or Diarrhea DIARRHEA 05/13/22   Raynelle Dick, NP  levothyroxine (SYNTHROID) 50 MCG tablet TAKE 1 TABLET DAILY ON AN EMPTY STOMACH WITH ONLY WATER FOR 30 MINUTES. NO ANTACID MEDS, CALCIUM OR MAGNESIUM FOR 4 HOURS AND AVOID BIOTIN 04/22/23   Raynelle Dick, NP   Magnesium 250 MG TABS Take 1 tablet by mouth as needed.    [provider]  olmesartan (BENICAR) 40 MG tablet Take 1 tablet Now & every Night for BP 08/30/23   Lucky Cowboy, MD  ondansetron [facility]) 8 MG tablet Take 1/2 to 1 tablet 3 x /day (every 6 hours) if needed for Nausea 08/03/23   Lucky Cowboy, MD  promethazine-dextromethorphan (PROMETHAZINE-DM) 6.25-15 MG/5ML syrup Take 5 mLs by mouth 4 (four) times daily as needed. 11/01/23   Myrlene Broker, MD  rosuvastatin (CRESTOR) 5 MG tablet TAKE 1/4 TABLET DAILY FOR CHOLESTEROL 08/15/23   Raynelle Dick, NP  Spacer/Aero-Holding Chambers (EASIVENT MASK SMALL) MISC See admin instructions. 03/18/16   [provider]  vitamin C (ASCORBIC ACID) 500 MG tablet Take 500 mg by mouth daily.    [provider]      Allergies    Alendronate, Risedronate sodium, Sulfa antibiotics, Sulfasalazine, Other, and Tetracyclines & related    Review of Systems   Review of Systems  Constitutional:  Positive for appetite change and fatigue. Negative for fever.  Respiratory:  Positive for cough and shortness of breath. Negative for chest tightness.   Cardiovascular:  Negative for chest pain, palpitations and leg swelling.  Gastrointestinal:  Negative for abdominal pain, diarrhea and vomiting.  Skin:  Negative for rash.  Neurological:  Negative for headaches.  Physical Exam Updated Vital Signs BP (!) 167/83   Pulse 84   Temp 98 F (36.7 C) (Oral)   Resp 19   Ht 5\' 4"  (1.626 m)   Wt 41.7 kg   LMP 09/14/2014   SpO2 97%   BMI 15.79 kg/m  Physical Exam Vitals and nursing note reviewed.  Constitutional:      Appearance: Normal appearance. She is ill-appearing.  HENT:     Head: Normocephalic.     Mouth/Throat:     Mouth: Mucous membranes are moist.  Cardiovascular:     Rate and Rhythm: Normal rate.  Pulmonary:     Effort: Pulmonary effort is normal. No tachypnea or respiratory distress.     Breath sounds:  Examination of the right-lower field reveals decreased breath sounds. Examination of the left-lower field reveals decreased breath sounds. Decreased breath sounds and wheezing present.  Abdominal:     Palpations: Abdomen is soft.     Tenderness: There is no abdominal tenderness.  Musculoskeletal:     Right lower leg: No edema.     Left lower leg: No edema.  Skin:    General: Skin is warm.  Neurological:     Mental Status: She is alert and oriented to person, place, and time. Mental status is at baseline.  Psychiatric:        Mood and Affect: Mood normal.     ED Results / Procedures / Treatments   Labs (all labs ordered are listed, but only abnormal results are displayed) Labs Reviewed  COMPREHENSIVE METABOLIC PANEL - Abnormal; Notable for the following components:      Result Value   Creatinine, Ser 1.01 (*)    Total Protein 6.1 (*)    GFR, Estimated 56 (*)    All other components within normal limits  URINALYSIS, W/ REFLEX TO CULTURE (INFECTION SUSPECTED) - Abnormal; Notable for the following components:   Protein, ur 30 (*)    All other components within normal limits  CBC WITH DIFFERENTIAL/PLATELET  MAGNESIUM  I-STAT CG4 LACTIC ACID, ED  I-STAT CG4 LACTIC ACID, ED    EKG None  Radiology DG Chest 2 View Result Date: 11/11/2023 CLINICAL DATA:  Shortness of breath for 3 weeks EXAM: CHEST - 2 VIEW COMPARISON:  10/30/2023 FINDINGS: Cardiac shadow is within normal limits. Patient is somewhat rotated to the right accentuating the mediastinal markings. Aortic calcifications are seen. No acute infiltrate or effusion is seen. No bony abnormality is noted. IMPRESSION: No active cardiopulmonary disease. Electronically Signed   By: Alcide Clever M.D.   On: 11/11/2023 19:11    Procedures Procedures    Medications Ordered in ED Medications  iohexol (OMNIPAQUE) 350 MG/ML injection 80 mL (has no administration in time range)  ipratropium-albuterol (DUONEB) 0.5-2.5 (3) MG/3ML  nebulizer solution 3 mL (3 mLs Nebulization Given 11/12/23 0002)  methylPREDNISolone sodium succinate (SOLU-MEDROL) 125 mg/2 mL injection 125 mg (125 mg Intravenous Given 11/12/23 0001)    ED Course/ Medical Decision Making/ A&P                                 Medical Decision Making Amount and/or Complexity of Data Reviewed Labs: ordered. Radiology: ordered.  Risk Prescription drug management.   83 year old female presents emergency department with ongoing shortness of breath, productive cough, generalized malaise.  She is tachycardic on arrival but afebrile.  She has scattered wheezes and diminished breath sounds on lung exam.  Symptomatic medicine  ordered.  Blood work is reassuring with no leukocytosis, metabolic panel is normal.  Lactic acid is normal.  Chest x-ray shows no acute finding but given the continued changes in her respiratory status will pursue a CT study to rule out blood clot/pneumonia.  Patient signed out pending imaging.        Final Clinical Impression(s) / ED Diagnoses Final diagnoses:  None    Rx / DC Orders ED Discharge Orders     None         Rozelle Logan, DO 11/12/23 0022

## 2023-11-12 NOTE — ED Notes (Signed)
 Patient transported to CT

## 2023-11-12 NOTE — Discharge Summary (Signed)
 Physician Discharge Summary  Shannon Parsons ZOX:[id number] DOB: 1941/05/07 DOA: 11/11/2023  PCP: Shannon Broker, MD  Admit date: 11/11/2023 Discharge date: 11/12/2023    Admitted From: Home Disposition: Home  Recommendations for Outpatient Follow-up:  Follow up with PCP in 1-2 weeks Please obtain BMP/CBC in one week Please follow up with your PCP on the following pending results: Unresulted Labs (From admission, onward)    None         Home Health: None Equipment/Devices: None  Discharge Condition: Stable CODE STATUS: Full code Diet recommendation: Cardiac  Due to brief hospitalization and better understanding, I have copied admitting hospitalist HPI as below. HPI: Shannon Parsons is a 83 y.o. female with medical history significant of asthma, GERD, hyperlipidemia, mantle cell lymphoma, melanoma, OSA, hypothyroidism, prediabetes.  Patient was recently seen by PCP on 2/10 for cough and shortness of breath.  She was diagnosed with bronchitis and prescribed prednisone.  Seen in the ED on 2/16 for abdominal pain and diarrhea.  CT showed possible mild colitis and bronchiolitis.  She was given Rocephin and discharged home on Augmentin and azithromycin.  She had tested positive for influenza A during this ED visit.   Patient returns to the ED complaining of ongoing shortness of breath, cough, and congestion despite finishing outpatient antibiotic course.  Unclear how hypoxic patient was in the ED, she was placed on 2 L supplemental oxygen.  Afebrile.  Labs showing no leukocytosis, lactic acid normal, COVID/influenza/RSV PCR negative.  CTA chest negative for PE but showing infectious/inflammatory bronchiolitis. Patient was given DuoNeb x 2, Solu-Medrol 125 mg, ceftriaxone, and azithromycin.  [facility] called to admit.   Patient states she has continued to have shortness of breath for the past 3 weeks despite taking steroids, antibiotics, and using albuterol inhaler.  Also had mild wheezing  and cough seems to have improved.  Denies fevers or chills.  Denies vomiting, abdominal pain, or diarrhea.  No other complaints.  Subjective: Seen and examined in the ED, husband at the bedside.  Patient says that she is feeling " excellent" with no shortness of breath and she demanded discharging home.  Brief/Interim Summary: Briefly, patient was admitted for bronchiolitis, PE and any other viral or bacterial infection was ruled out.  She was started on steroids.  Seen in ED, feels well, no wheezing on my examination.  We did ambulatory oximetry, she was saturating 90% on room air with ambulation and did not require any oxygen.  She wants to go home.  She has been discharged on few more days of oral prednisone.  Resume rest of the home medications.  Of note, patient does have upper body tremors which is chronic for her.  Discharge plan was discussed with patient and/or family member and they verbalized understanding and agreed with it.  Discharge Diagnoses:  Principal Problem:   Bronchiolitis Active Problems:   Hypothyroidism   Dyslipidemia   Acute hypoxemic respiratory failure (HCC)    Discharge Instructions   Allergies as of 11/12/2023       Reactions   Alendronate Other (See Comments)   "made me choke" "made me choke", chokes   Risedronate Sodium Other (See Comments)   "made me choke"    Sulfa Antibiotics Other (See Comments)   Doesn't remember    Sulfasalazine Other (See Comments)   Doesn't remember    Other Rash   Tetracyclines & Related Rash        Medication List     STOP taking these medications  azithromycin 250 MG tablet Commonly known as: ZITHROMAX   diazepam 5 MG tablet Commonly known as: VALIUM   hydroquinone 4 % cream       TAKE these medications    acetaminophen 325 MG tablet Commonly known as: TYLENOL Take 650 mg by mouth at bedtime as needed for mild pain.   albuterol 108 (90 Base) MCG/ACT inhaler Commonly known as: VENTOLIN HFA Inhale  1-2 puffs into the lungs every 6 (six) hours as needed for wheezing or shortness of breath.   ascorbic acid 500 MG tablet Commonly known as: VITAMIN C Take 500 mg by mouth every other day.   EasiVent Mask Small Misc See admin instructions.   hyoscyamine 0.125 MG SL tablet Commonly known as: LEVSIN SL Take    1 to 2 tablets     every 4 hours       as needed for Nausea, Cramping, Bloating or Diarrhea DIARRHEA   levothyroxine 50 MCG tablet Commonly known as: SYNTHROID TAKE 1 TABLET DAILY ON AN EMPTY STOMACH WITH ONLY WATER FOR 30 MINUTES. NO ANTACID MEDS, CALCIUM OR MAGNESIUM FOR 4 HOURS AND AVOID BIOTIN   Magnesium 250 MG Tabs Take 1 tablet by mouth daily as needed.   ondansetron 8 MG tablet Commonly known as: ZOFRAN Take 1/2 to 1 tablet 3 x /day (every 6 hours) if needed for Nausea   predniSONE 50 MG tablet Commonly known as: DELTASONE Take 1 tablet (50 mg total) by mouth daily with breakfast for 4 days.   promethazine-dextromethorphan 6.25-15 MG/5ML syrup Commonly known as: PROMETHAZINE-DM Take 5 mLs by mouth 4 (four) times daily as needed.   rosuvastatin 5 MG tablet Commonly known as: CRESTOR TAKE 1/4 TABLET DAILY FOR CHOLESTEROL   Vitamin D3 50 MCG (2000 UT) capsule Take 4,000 Units by mouth daily.        Follow-up Information     Shannon Broker, MD Follow up in 1 week(s).   Specialty: Internal Medicine Contact information: [address] [place] [address] [phone]                Allergies  Allergen Reactions   Alendronate Other (See Comments)    "made me choke"  "made me choke", chokes   Risedronate Sodium Other (See Comments)    "made me choke"     Sulfa Antibiotics Other (See Comments)    Doesn't remember    Sulfasalazine Other (See Comments)    Doesn't remember    Other Rash   Tetracyclines & Related Rash    Consultations: None   Procedures/Studies: CT Angio Chest PE W/Cm &/Or Wo Cm Result Date:  11/12/2023 CLINICAL DATA:  Positive D-dimer. Breathing treatment. Shortness of breath. EXAM: CT ANGIOGRAPHY CHEST WITH CONTRAST TECHNIQUE: Multidetector CT imaging of the chest was performed using the standard protocol during bolus administration of intravenous contrast. Multiplanar CT image reconstructions and MIPs were obtained to evaluate the vascular anatomy. RADIATION DOSE REDUCTION: This exam was performed according to the departmental dose-optimization program which includes automated exposure control, adjustment of the mA and/or kV according to patient size and/or use of iterative reconstruction technique. CONTRAST:  80mL OMNIPAQUE IOHEXOL 350 MG/ML SOLN COMPARISON:  Chest radiograph 11/11/2023 and CTA chest 03/27/2021 FINDINGS: Cardiovascular: Negative for acute pulmonary embolism. No pericardial effusion. Coronary artery and aortic atherosclerotic calcification. Mediastinum/Nodes: Trachea is unremarkable. Unremarkable esophagus. No thoracic adenopathy. Lungs/Pleura: Bilateral scattered centrilobular micro nodules and tree-in-bud opacities. Bibasilar atelectasis/scarring. No pleural effusion or pneumothorax. Biapical pleural-parenchymal scarring. Upper Abdomen: No acute  abnormality. Musculoskeletal: No acute fracture. Review of the MIP images confirms the above findings. IMPRESSION: 1. Negative for acute pulmonary embolism. 2. Infectious/inflammatory bronchiolitis. 3. Aortic Atherosclerosis (ICD10-I70.0). Electronically Signed   By: Minerva Fester M.D.   On: 11/12/2023 01:11   DG Chest 2 View Result Date: 11/11/2023 CLINICAL DATA:  Shortness of breath for 3 weeks EXAM: CHEST - 2 VIEW COMPARISON:  10/30/2023 FINDINGS: Cardiac shadow is within normal limits. Patient is somewhat rotated to the right accentuating the mediastinal markings. Aortic calcifications are seen. No acute infiltrate or effusion is seen. No bony abnormality is noted. IMPRESSION: No active cardiopulmonary disease. Electronically Signed    By: Alcide Clever M.D.   On: 11/11/2023 19:11   CT ABDOMEN PELVIS W CONTRAST Result Date: 10/30/2023 CLINICAL DATA:  Abdominal pain, acute, nonlocalized. History of lymphoma EXAM: CT ABDOMEN AND PELVIS WITH CONTRAST TECHNIQUE: Multidetector CT imaging of the abdomen and pelvis was performed using the standard protocol following bolus administration of intravenous contrast. RADIATION DOSE REDUCTION: This exam was performed according to the departmental dose-optimization program which includes automated exposure control, adjustment of the mA and/or kV according to patient size and/or use of iterative reconstruction technique. CONTRAST:  80mL OMNIPAQUE IOHEXOL 300 MG/ML  SOLN COMPARISON:  09/20/2014 FINDINGS: Lower chest: Mild bronchial wall thickening with subtle peribronchovascular nodularity in the bilateral lung bases. Hepatobiliary: Stable small hepatic cysts. No new focal liver abnormality. Unremarkable gallbladder. No hyperdense gallstone. No biliary dilatation. Pancreas: Unremarkable. No pancreatic ductal dilatation or surrounding inflammatory changes. Spleen: Normal in size without focal abnormality. Adrenals/Urinary Tract: Adrenal glands are unremarkable. Kidneys are normal, without renal calculi, focal lesion, or hydronephrosis. Extrarenal pelvis on the right. Bladder is unremarkable. Stomach/Bowel: Stomach within normal limits. No abnormally dilated loops of bowel. Colonic diverticulosis. Long segment borderline-mild wall thickening of the sigmoid colon. No focally inflamed diverticulum is identified. Vascular/Lymphatic: Aortic atherosclerosis. No enlarged abdominal or pelvic lymph nodes. Reproductive: Uterus is atrophic or surgically absent. No adnexal masses. Other: Trace free fluid within the pelvis.  No free air. Musculoskeletal: Chronic bilateral L5 pars interarticularis defects with grade 1 anterolisthesis of L5 on S1. Chronic mild L5 compression fracture. No acute bony abnormality. IMPRESSION: 1.  Colonic diverticulosis with long segment borderline-mild wall thickening of the sigmoid colon. No focally inflamed diverticulum is identified. Findings may represent a mild colitis. 2. Mild bronchial wall thickening with subtle peribronchovascular nodularity in the bilateral lung bases, suggestive of an infectious or inflammatory bronchiolitis. 3. Trace free fluid within the pelvis, likely reactive. 4. Aortic atherosclerosis (ICD10-I70.0). Electronically Signed   By: Duanne Guess D.O.   On: 10/30/2023 19:40   DG Chest Port 1 View Result Date: 10/30/2023 CLINICAL DATA:  Cough.  Diarrhea for 5 days. EXAM: PORTABLE CHEST 1 VIEW COMPARISON:  09/08/2022 FINDINGS: The cardiomediastinal contours are normal. Chronic hyperinflation. Chronic interstitial coarsening. Pulmonary vasculature is normal. No consolidation, pleural effusion, or pneumothorax. No acute osseous abnormalities are seen. IMPRESSION: Chronic hyperinflation and interstitial coarsening. No acute findings. Electronically Signed   By: Narda Rutherford M.D.   On: 10/30/2023 18:09     Discharge Exam: Vitals:   11/12/23 0930 11/12/23 1000  BP: (!) 164/75 (!) 134/93  Pulse: (!) 105 (!) 107  Resp: 17 20  Temp:  98.1 F (36.7 C)  SpO2: 92% 92%   Vitals:   11/12/23 0800 11/12/23 0830 11/12/23 0930 11/12/23 1000  BP: 134/89 (!) 137/94 (!) 164/75 (!) 134/93  Pulse: 86 89 (!) 105 (!) 107  Resp: (!) 22  20 17 20   Temp:    98.1 F (36.7 C)  TempSrc:    Oral  SpO2: 92% 93% 92% 92%  Weight:      Height:        General: Pt is alert, awake, not in acute distress Cardiovascular: RRR, S1/S2 +, no rubs, no gallops Respiratory: CTA bilaterally, no wheezing, no rhonchi Abdominal: Soft, NT, ND, bowel sounds + Extremities: no edema, no cyanosis    The results of significant diagnostics from this hospitalization (including imaging, microbiology, ancillary and laboratory) are listed below for reference.     Microbiology: Recent Results (from  the past 240 hours)  Resp panel by RT-PCR (RSV, Flu A&B, Covid) Anterior Nasal Swab     Status: None   Collection Time: 11/12/23  2:06 AM   Specimen: Anterior Nasal Swab  Result Value Ref Range Status   SARS Coronavirus 2 by RT PCR NEGATIVE NEGATIVE Final    Comment: (NOTE) SARS-CoV-2 target nucleic acids are NOT DETECTED.  The SARS-CoV-2 RNA is generally detectable in upper respiratory specimens during the acute phase of infection. The lowest concentration of SARS-CoV-2 viral copies this assay can detect is 138 copies/mL. A negative result does not preclude SARS-Cov-2 infection and should not be used as the sole basis for treatment or other patient management decisions. A negative result may occur with  improper specimen collection/handling, submission of specimen other than nasopharyngeal swab, presence of viral mutation(s) within the areas targeted by this assay, and inadequate number of viral copies(<138 copies/mL). A negative result must be combined with clinical observations, patient history, and epidemiological information. The expected result is Negative.  Fact Sheet for Patients:  BloggerCourse.com  Fact Sheet for Healthcare Providers:  SeriousBroker.it  This test is no t yet approved or cleared by the [place] FDA and  has been authorized for detection and/or diagnosis of SARS-CoV-2 by FDA under an Emergency Use Authorization (EUA). This EUA will remain  in effect (meaning this test can be used) for the duration of the COVID-19 declaration under Section 564(b)(1) of the Act, 21 U.S.C.section 360bbb-3(b)(1), unless the authorization is terminated  or revoked sooner.       Influenza A by PCR NEGATIVE NEGATIVE Final   Influenza B by PCR NEGATIVE NEGATIVE Final    Comment: (NOTE) The Xpert Xpress SARS-CoV-2/FLU/RSV plus assay is intended as an aid in the diagnosis of influenza from Nasopharyngeal swab specimens  and should not be used as a sole basis for treatment. Nasal washings and aspirates are unacceptable for Xpert Xpress SARS-CoV-2/FLU/RSV testing.  Fact Sheet for Patients: BloggerCourse.com  Fact Sheet for Healthcare Providers: SeriousBroker.it  This test is not yet approved or cleared by the [place] FDA and has been authorized for detection and/or diagnosis of SARS-CoV-2 by FDA under an Emergency Use Authorization (EUA). This EUA will remain in effect (meaning this test can be used) for the duration of the COVID-19 declaration under Section 564(b)(1) of the Act, 21 U.S.C. section 360bbb-3(b)(1), unless the authorization is terminated or revoked.     Resp Syncytial Virus by PCR NEGATIVE NEGATIVE Final    Comment: (NOTE) Fact Sheet for Patients: BloggerCourse.com  Fact Sheet for Healthcare Providers: SeriousBroker.it  This test is not yet approved or cleared by the [place] FDA and has been authorized for detection and/or diagnosis of SARS-CoV-2 by FDA under an Emergency Use Authorization (EUA). This EUA will remain in effect (meaning this test can be used) for the duration of the COVID-19 declaration under Section  564(b)(1) of the Act, 21 U.S.C. section 360bbb-3(b)(1), unless the authorization is terminated or revoked.  Performed at [facility], [address], [place], [place] [address]      Labs: BNP (last 3 results) No results for input(s): "BNP" in the last 8760 hours. Basic Metabolic Panel: Recent Labs  Lab 11/11/23 1834  NA 137  K 3.8  CL 104  CO2 24  GLUCOSE 99  BUN 15  CREATININE 1.01*  CALCIUM 9.4   Liver Function Tests: Recent Labs  Lab 11/11/23 1834  AST 37  ALT 23  ALKPHOS 49  BILITOT 1.1  PROT 6.1*  ALBUMIN 3.5   No results for input(s): "LIPASE", "AMYLASE" in the last 168 hours. No results for input(s):  "AMMONIA" in the last 168 hours. CBC: Recent Labs  Lab 11/11/23 1834  WBC 5.6  NEUTROABS 3.7  HGB 14.7  HCT 44.9  MCV 88.0  PLT 257   Cardiac Enzymes: No results for input(s): "CKTOTAL", "CKMB", "CKMBINDEX", "TROPONINI" in the last 168 hours. BNP: Invalid input(s): "POCBNP" CBG: No results for input(s): "GLUCAP" in the last 168 hours. D-Dimer No results for input(s): "DDIMER" in the last 72 hours. Hgb A1c No results for input(s): "HGBA1C" in the last 72 hours. Lipid Profile No results for input(s): "CHOL", "HDL", "LDLCALC", "TRIG", "CHOLHDL", "LDLDIRECT" in the last 72 hours. Thyroid function studies No results for input(s): "TSH", "T4TOTAL", "T3FREE", "THYROIDAB" in the last 72 hours.  Invalid input(s): "FREET3" Anemia work up No results for input(s): "VITAMINB12", "FOLATE", "FERRITIN", "TIBC", "IRON", "RETICCTPCT" in the last 72 hours. Urinalysis    Component Value Date/Time   COLORURINE YELLOW 11/11/2023 2034   APPEARANCEUR CLEAR 11/11/2023 2034   LABSPEC 1.015 11/11/2023 2034   LABSPEC 1.010 02/06/2015 0953   PHURINE 7.0 11/11/2023 2034   GLUCOSEU NEGATIVE 11/11/2023 2034   HGBUR NEGATIVE 11/11/2023 2034   BILIRUBINUR NEGATIVE 11/11/2023 2034   KETONESUR NEGATIVE 11/11/2023 2034   PROTEINUR 30 (A) 11/11/2023 2034   UROBILINOGEN 0.2 04/02/2015 1511   UROBILINOGEN 0.2 02/06/2015 0953   NITRITE NEGATIVE 11/11/2023 2034   LEUKOCYTESUR NEGATIVE 11/11/2023 2034   Sepsis Labs Recent Labs  Lab 11/11/23 1834  WBC 5.6   Microbiology Recent Results (from the past 240 hours)  Resp panel by RT-PCR (RSV, Flu A&B, Covid) Anterior Nasal Swab     Status: None   Collection Time: 11/12/23  2:06 AM   Specimen: Anterior Nasal Swab  Result Value Ref Range Status   SARS Coronavirus 2 by RT PCR NEGATIVE NEGATIVE Final    Comment: (NOTE) SARS-CoV-2 target nucleic acids are NOT DETECTED.  The SARS-CoV-2 RNA is generally detectable in upper respiratory specimens during the  acute phase of infection. The lowest concentration of SARS-CoV-2 viral copies this assay can detect is 138 copies/mL. A negative result does not preclude SARS-Cov-2 infection and should not be used as the sole basis for treatment or other patient management decisions. A negative result may occur with  improper specimen collection/handling, submission of specimen other than nasopharyngeal swab, presence of viral mutation(s) within the areas targeted by this assay, and inadequate number of viral copies(<138 copies/mL). A negative result must be combined with clinical observations, patient history, and epidemiological information. The expected result is Negative.  Fact Sheet for Patients:  BloggerCourse.com  Fact Sheet for Healthcare Providers:  SeriousBroker.it  This test is no t yet approved or cleared by the [place] FDA and  has been authorized for detection and/or diagnosis of SARS-CoV-2 by FDA under  an Emergency Use Authorization (EUA). This EUA will remain  in effect (meaning this test can be used) for the duration of the COVID-19 declaration under Section 564(b)(1) of the Act, 21 U.S.C.section 360bbb-3(b)(1), unless the authorization is terminated  or revoked sooner.       Influenza A by PCR NEGATIVE NEGATIVE Final   Influenza B by PCR NEGATIVE NEGATIVE Final    Comment: (NOTE) The Xpert Xpress SARS-CoV-2/FLU/RSV plus assay is intended as an aid in the diagnosis of influenza from Nasopharyngeal swab specimens and should not be used as a sole basis for treatment. Nasal washings and aspirates are unacceptable for Xpert Xpress SARS-CoV-2/FLU/RSV testing.  Fact Sheet for Patients: BloggerCourse.com  Fact Sheet for Healthcare Providers: SeriousBroker.it  This test is not yet approved or cleared by the [place] FDA and has been authorized for detection and/or  diagnosis of SARS-CoV-2 by FDA under an Emergency Use Authorization (EUA). This EUA will remain in effect (meaning this test can be used) for the duration of the COVID-19 declaration under Section 564(b)(1) of the Act, 21 U.S.C. section 360bbb-3(b)(1), unless the authorization is terminated or revoked.     Resp Syncytial Virus by PCR NEGATIVE NEGATIVE Final    Comment: (NOTE) Fact Sheet for Patients: BloggerCourse.com  Fact Sheet for Healthcare Providers: SeriousBroker.it  This test is not yet approved or cleared by the [place] FDA and has been authorized for detection and/or diagnosis of SARS-CoV-2 by FDA under an Emergency Use Authorization (EUA). This EUA will remain in effect (meaning this test can be used) for the duration of the COVID-19 declaration under Section 564(b)(1) of the Act, 21 U.S.C. section 360bbb-3(b)(1), unless the authorization is terminated or revoked.  Performed at [facility], [address], [place], [place] [address]     FURTHER DISCHARGE INSTRUCTIONS:   Get Medicines reviewed and adjusted: Please take all your medications with you for your next visit with your Primary MD   Laboratory/radiological data: Please request your Primary MD to go over all hospital tests and procedure/radiological results at the follow up, please ask your Primary MD to get all Hospital records sent to his/her office.   In some cases, they will be blood work, cultures and biopsy results pending at the time of your discharge. Please request that your primary care M.D. goes through all the records of your hospital data and follows up on these results.   Also Note the following: If you experience worsening of your admission symptoms, develop shortness of breath, life threatening emergency, suicidal or homicidal thoughts you must seek medical attention immediately by calling 911 or calling your MD  immediately  if symptoms less severe.   You must read complete instructions/literature along with all the possible adverse reactions/side effects for all the Medicines you take and that have been prescribed to you. Take any new Medicines after you have completely understood and accpet all the possible adverse reactions/side effects.    Do not drive when taking Pain medications or sleeping medications (Benzodaizepines)   Do not take more than prescribed Pain, Sleep and Anxiety Medications. It is not advisable to combine anxiety,sleep and pain medications without talking with your primary care practitioner   Special Instructions: If you have smoked or chewed Tobacco  in the last 2 yrs please stop smoking, stop any regular Alcohol  and or any Recreational drug use.   Wear Seat belts while driving.   Please note: You were cared for by a hospitalist during your hospital stay.  Once you are discharged, your primary care physician will handle any further medical issues. Please note that NO REFILLS for any discharge medications will be authorized once you are discharged, as it is imperative that you return to your primary care physician (or establish a relationship with a primary care physician if you do not have one) for your post hospital discharge needs so that they can reassess your need for medications and monitor your lab values  Time coordinating discharge: Over 30 minutes  SIGNED:   Hughie Closs, MD  [facility] 11/12/2023, 11:44 AM *Please note that this is a verbal dictation therefore any spelling or grammatical errors are due to the "Dragon Medical One" system interpretation. If 7PM-7AM, please contact night-coverage [email or web address]

## 2023-11-12 NOTE — ED Provider Notes (Signed)
  Physical Exam  BP (!) 134/93   Pulse (!) 107   Temp 98.2 F (36.8 C) (Oral)   Resp 20   Ht 5\' 4"  (1.626 m)   Wt 41.7 kg   LMP 09/14/2014   SpO2 90%   BMI 15.79 kg/m   Physical Exam  Procedures  Procedures  ED Course / MDM    Medical Decision Making Amount and/or Complexity of Data Reviewed Labs: ordered. Radiology: ordered.  Risk Prescription drug management.   Patient was seen by hospitalist and discharged.  I was asked to click the actual discharge button       Benjiman Core, MD 11/12/23 1035

## 2023-11-14 LAB — I-STAT CG4 LACTIC ACID, ED: Lactic Acid, Venous: 1.2 mmol/L (ref 0.5–1.9)

## 2023-11-15 ENCOUNTER — Encounter: Payer: Self-pay | Admitting: Internal Medicine

## 2023-11-15 ENCOUNTER — Ambulatory Visit (INDEPENDENT_AMBULATORY_CARE_PROVIDER_SITE_OTHER): Payer: PPO | Admitting: Internal Medicine

## 2023-11-15 VITALS — BP 120/100 | HR 76 | Temp 98.3°F | Ht 64.0 in | Wt 95.0 lb

## 2023-11-15 DIAGNOSIS — J4489 Other specified chronic obstructive pulmonary disease: Secondary | ICD-10-CM | POA: Diagnosis not present

## 2023-11-15 MED ORDER — BREZTRI AEROSPHERE 160-9-4.8 MCG/ACT IN AERO: 2.0000 | INHALATION_SPRAY | Freq: Two times a day (BID) | RESPIRATORY_TRACT | 11 refills | Status: AC

## 2023-11-15 MED ORDER — METOPROLOL SUCCINATE ER 25 MG PO TB24: 25.0000 mg | ORAL_TABLET | Freq: Every day | ORAL | 3 refills | Status: DC

## 2023-11-15 NOTE — Patient Instructions (Signed)
 We are sending in breztri which is an inhaler to use 2 puffs twice a day every day to help the breathing improve.   We are sending in metoprolol which is 1 pill daily to help with tremor to take in the morning.

## 2023-11-15 NOTE — Progress Notes (Unsigned)
   Subjective:   Patient ID: Shannon Parsons, female    DOB: [birth year], 83 y.o.   MRN: [id number]  HPI The patient is an 83 YO female coming in for ER follow up (sick with flu and in ER for worsening SOB, given additional treatment and antibiotics and more steroids). She is feeling stable and mild improvement. Still feeling very sick and tired. Tremor still present and bothersome.   Review of Systems  Constitutional:  Positive for activity change, appetite change and fatigue.  HENT: Negative.    Eyes: Negative.   Respiratory:  Positive for cough and shortness of breath. Negative for chest tightness.   Cardiovascular:  Negative for chest pain, palpitations and leg swelling.  Gastrointestinal:  Negative for abdominal distention, abdominal pain, constipation, diarrhea, nausea and vomiting.  Musculoskeletal: Negative.   Skin: Negative.   Neurological: Negative.   Psychiatric/Behavioral: Negative.      Objective:  Physical Exam Constitutional:      Appearance: She is well-developed.  HENT:     Head: Normocephalic and atraumatic.  Cardiovascular:     Rate and Rhythm: Normal rate and regular rhythm.  Pulmonary:     Effort: Pulmonary effort is normal. No respiratory distress.     Breath sounds: Rhonchi present. No rales.  Abdominal:     General: Bowel sounds are normal. There is no distension.     Palpations: Abdomen is soft.     Tenderness: There is no abdominal tenderness. There is no rebound.  Musculoskeletal:     Cervical back: Normal range of motion.  Skin:    General: Skin is warm and dry.  Neurological:     Mental Status: She is alert and oriented to person, place, and time.     Coordination: Coordination normal.     Vitals:   11/15/23 1332  BP: (!) 120/100  Pulse: 76  Temp: 98.3 F (36.8 C)  TempSrc: Oral  SpO2: 90%  Weight: 95 lb (43.1 kg)  Height: 5\' 4"  (1.626 m)    Assessment & Plan:  Visit time 25 minutes in face to face communication with patient and  coordination of care, additional 5 minutes spent in record review, coordination or care, ordering tests, communicating/referring to other healthcare professionals, documenting in medical records all on the same day of the visit for total time 30 minutes spent on the visit.

## 2023-11-17 NOTE — Assessment & Plan Note (Signed)
 Needs controlling medicine. She did not do well with powder. Rx breztri. She and husband feel that breathing was fairly normal with short exertion before sickness so she will finish steroids and antibiotics. This could take some time. Follow up 2-3 weeks to ensure ongoing improvement. Did walk test and O2 levels did not go <90% but her walking tolerance was very low.

## 2023-11-25 ENCOUNTER — Encounter: Payer: PPO | Admitting: Internal Medicine

## 2023-12-06 ENCOUNTER — Encounter: Payer: Self-pay | Admitting: Internal Medicine

## 2023-12-06 ENCOUNTER — Ambulatory Visit (INDEPENDENT_AMBULATORY_CARE_PROVIDER_SITE_OTHER): Admitting: Internal Medicine

## 2023-12-06 VITALS — BP 138/82 | HR 74 | Temp 98.4°F | Ht 64.0 in | Wt 94.0 lb

## 2023-12-06 DIAGNOSIS — E538 Deficiency of other specified B group vitamins: Secondary | ICD-10-CM | POA: Diagnosis not present

## 2023-12-06 DIAGNOSIS — J4489 Other specified chronic obstructive pulmonary disease: Secondary | ICD-10-CM

## 2023-12-06 DIAGNOSIS — R0989 Other specified symptoms and signs involving the circulatory and respiratory systems: Secondary | ICD-10-CM | POA: Diagnosis not present

## 2023-12-06 DIAGNOSIS — J479 Bronchiectasis, uncomplicated: Secondary | ICD-10-CM | POA: Diagnosis not present

## 2023-12-06 DIAGNOSIS — R251 Tremor, unspecified: Secondary | ICD-10-CM | POA: Diagnosis not present

## 2023-12-06 DIAGNOSIS — F411 Generalized anxiety disorder: Secondary | ICD-10-CM | POA: Diagnosis not present

## 2023-12-06 LAB — VITAMIN B12: Vitamin B-12: >1537 pg/mL — ABNORMAL HIGH (ref 211–911)

## 2023-12-06 MED ORDER — CYANOCOBALAMIN 1000 MCG/ML IJ SOLN
1000.0000 ug | Freq: Once | INTRAMUSCULAR | Status: AC
  Administered 2023-12-06: 1000 ug via INTRAMUSCULAR

## 2023-12-06 MED ORDER — BUSPIRONE HCL 5 MG PO TABS: 5.0000 mg | ORAL_TABLET | Freq: Two times a day (BID) | ORAL | 3 refills | Status: DC | PRN

## 2023-12-06 NOTE — Patient Instructions (Addendum)
 We have sent in buspar 5 mg twice a day as needed for anxiety.   The breztri you can use as you need it or everyday.  Check at home if you have the metoprolol and if not start taking it go ahead and start this is for the tremor.  We will check the B12 lab today and give you a B12 shot.

## 2023-12-06 NOTE — Assessment & Plan Note (Signed)
 Has not started metoprolol. Advised to start to help with tremor. This seemed less prevalent on exam today but still at home prevalent.

## 2023-12-06 NOTE — Progress Notes (Signed)
   Subjective:   Patient ID: Shannon Parsons, female    DOB: 03-12-1941, 83 y.o.   MRN: [id number]  HPI The patient is an 83 YO female coming in for follow up breathing. She has been struggling with a bad flare up of her COPD/asthma due to flu A. Used breztri and this helped significantly. She is not 100% but a lot closer. She is unsure if she picked up metoprolol for tremor but would like to try that. She is also having a lot of anxiety (husband with dementia but in denial about it). And tired concerned about low B12 due to prior low levels.   Review of Systems  Constitutional:  Positive for activity change and fatigue.  HENT: Negative.    Eyes: Negative.   Respiratory:  Negative for cough, chest tightness and shortness of breath.   Cardiovascular:  Negative for chest pain, palpitations and leg swelling.  Gastrointestinal:  Negative for abdominal distention, abdominal pain, constipation, diarrhea, nausea and vomiting.  Musculoskeletal: Negative.   Skin: Negative.   Neurological: Negative.   Psychiatric/Behavioral:  The patient is nervous/anxious.     Objective:  Physical Exam Constitutional:      Appearance: She is well-developed.  HENT:     Head: Normocephalic and atraumatic.  Cardiovascular:     Rate and Rhythm: Normal rate and regular rhythm.  Pulmonary:     Effort: Pulmonary effort is normal. No respiratory distress.     Breath sounds: Normal breath sounds. No wheezing or rales.  Abdominal:     General: Bowel sounds are normal. There is no distension.     Palpations: Abdomen is soft.     Tenderness: There is no abdominal tenderness. There is no rebound.  Musculoskeletal:     Cervical back: Normal range of motion.  Skin:    General: Skin is warm and dry.  Neurological:     Mental Status: She is alert and oriented to person, place, and time.     Coordination: Coordination normal.     Vitals:   12/06/23 1301  BP: 138/82  Pulse: 74  Temp: 98.4 F (36.9 C)  TempSrc:  Oral  SpO2: 96%  Weight: 94 lb (42.6 kg)  Height: 5\' 4"  (1.626 m)    Assessment & Plan:  B12 shot given

## 2023-12-06 NOTE — Assessment & Plan Note (Signed)
 No flare today and has trelegy on hand. Used this 1-2 weeks during illness and now using prn only.

## 2023-12-06 NOTE — Assessment & Plan Note (Signed)
 Rx buspar 5 mg BID prn to help with anxiety.

## 2023-12-06 NOTE — Assessment & Plan Note (Signed)
 BP at goal now that her breathing is improved. Will continue close monitoring.

## 2023-12-06 NOTE — Assessment & Plan Note (Signed)
 Infection is resolved. She used trelegy for 1-2 weeks and now has on hand to use if needed. Advised she can use daily if helpful or as needed. She has albuterol inhaler but this makes her tremor worse so she tries not to use.

## 2023-12-06 NOTE — Assessment & Plan Note (Signed)
 Prior deficiency and will check levels. Given B12 shot today and this may affect levels. If normal will do otc after shot today and if low will do series of 4 weekly shots (3 more after today).

## 2023-12-08 ENCOUNTER — Telehealth: Payer: Self-pay | Admitting: *Deleted

## 2023-12-08 NOTE — Telephone Encounter (Signed)
 Received a call from patient asking if she needs to follow up with Dr Myna Hidalgo.  Dr Myna Hidalgo notified.  Patient does not need anymore scans or follow up appointments.  If she ever has a question or concern to let us know and we can see her in the office.  Patient appreciates the call

## 2023-12-09 ENCOUNTER — Ambulatory Visit

## 2023-12-09 VITALS — BP 122/80 | HR 78 | Ht 63.5 in | Wt 96.8 lb

## 2023-12-09 DIAGNOSIS — Z0000 Encounter for general adult medical examination without abnormal findings: Secondary | ICD-10-CM

## 2023-12-09 NOTE — Patient Instructions (Addendum)
 Shannon Parsons , Thank you for taking time to come for your Medicare Wellness Visit. I appreciate your ongoing commitment to your health goals. Please review the following plan we discussed and let me know if I can assist you in the future.   Referrals/Orders/Follow-Ups/Clinician Recommendations: It was nice to meet you today.  Keep up the good work.  Remember to get scheduled for your mammogram and bone density by this summer.   This is a list of the screening recommended for you and due dates:  Health Maintenance  Topic Date Due   Zoster (Shingles) Vaccine (1 of 2) 08/17/1962   COVID-19 Vaccine (4 - 2024-25 season) 05/15/2023   Flu Shot  12/12/2023*   Medicare Annual Wellness Visit  12/08/2024   Colon Cancer Screening  05/26/2027   Pneumonia Vaccine  Completed   DEXA scan (bone density measurement)  Completed   HPV Vaccine  Aged Out   DTaP/Tdap/Td vaccine  Discontinued   Hepatitis C Screening  Discontinued  *Topic was postponed. The date shown is not the original due date.    Advanced directives: (In Chart) A copy of your advanced directives are scanned into your chart should your provider ever need it.  Next Medicare Annual Wellness Visit scheduled for next year: Yes

## 2023-12-09 NOTE — Progress Notes (Deleted)
 Subjective:   Shannon Parsons is a 83 y.o. who presents for a Medicare Wellness preventive visit.  Visit Complete: In person   Persons Participating in Visit: Patient.  AWV Questionnaire: No: Patient Medicare AWV questionnaire was not completed prior to this visit.        Objective:    There were no vitals filed for this visit. There is no height or weight on file to calculate BMI.     11/11/2023    6:13 PM 10/30/2023    4:06 PM 03/04/2023   11:29 AM 01/04/2023    8:21 AM 07/20/2022   10:59 AM 02/03/2022    2:22 PM 01/14/2022   11:16 AM  Advanced Directives  Does Patient Have a Medical Advance Directive? Yes No Yes Yes Yes  Yes  Type of Estate agent of [place];Living will  Living will;Healthcare Power of [employer] Power of [place];Living will Living will;Healthcare Power of [employer] Power of [place];Living will Living will;Healthcare Power of Attorney  Does patient want to make changes to medical advance directive?   No - Patient declined No - Patient declined  No - Patient declined No - Patient declined  Copy of Healthcare Power of Attorney in Chart?   No - copy requested  No - copy requested No - copy requested   Would patient like information on creating a medical advance directive?     No - Patient declined      Current Medications (verified) Outpatient Encounter Medications as of 12/09/2023  Medication Sig   acetaminophen (TYLENOL) 325 MG tablet Take 650 mg by mouth at bedtime as needed for mild pain.   albuterol (VENTOLIN HFA) 108 (90 Base) MCG/ACT inhaler Inhale 1-2 puffs into the lungs every 6 (six) hours as needed for wheezing or shortness of breath.   Budeson-Glycopyrrol-Formoterol (BREZTRI AEROSPHERE) 160-9-4.8 MCG/ACT AERO Inhale 2 puffs into the lungs 2 (two) times daily.   busPIRone (BUSPAR) 5 MG tablet Take 1 tablet (5 mg total) by mouth 2 (two) times daily as needed.   Cholecalciferol (VITAMIN D3) 50 MCG (2000 UT)  capsule Take 4,000 Units by mouth daily.    hyoscyamine (LEVSIN SL) 0.125 MG SL tablet Take    1 to 2 tablets     every 4 hours       as needed for Nausea, Cramping, Bloating or Diarrhea DIARRHEA   levothyroxine (SYNTHROID) 50 MCG tablet TAKE 1 TABLET DAILY ON AN EMPTY STOMACH WITH ONLY WATER FOR 30 MINUTES. NO ANTACID MEDS, CALCIUM OR MAGNESIUM FOR 4 HOURS AND AVOID BIOTIN   Magnesium 250 MG TABS Take 1 tablet by mouth daily as needed.   metoprolol succinate (TOPROL-XL) 25 MG 24 hr tablet Take 1 tablet (25 mg total) by mouth daily. (Patient not taking: Reported on 12/06/2023)   ondansetron (ZOFRAN) 8 MG tablet Take 1/2 to 1 tablet 3 x /day (every 6 hours) if needed for Nausea   promethazine-dextromethorphan (PROMETHAZINE-DM) 6.25-15 MG/5ML syrup Take 5 mLs by mouth 4 (four) times daily as needed. (Patient not taking: Reported on 11/15/2023)   rosuvastatin (CRESTOR) 5 MG tablet TAKE 1/4 TABLET DAILY FOR CHOLESTEROL   Spacer/Aero-Holding Chambers (EASIVENT MASK SMALL) MISC See admin instructions.   vitamin C (ASCORBIC ACID) 500 MG tablet Take 500 mg by mouth every other day.   No facility-administered encounter medications on file as of 12/09/2023.    Allergies (verified) Alendronate, Risedronate sodium, Sulfa antibiotics, Sulfasalazine, Other, and Tetracyclines & related   History: Past Medical History:  Diagnosis Date   Adenomatous colon polyp 1994   Asthma    Cataract 2013   bilateral eyes   Diverticulosis    Family history of ischemic heart disease    Fibrocystic breast disease    GERD (gastroesophageal reflux disease)    Hiatal hernia    Hyperlipidemia    IBS (irritable bowel syndrome)    Internal hemorrhoids    Mantle cell lymphoma (HCC) 10/18/2014   Melanoma (HCC)    Facial, pt denies on 01/16/18   OSA (obstructive sleep apnea)    Osteoarthritis    Osteoporosis    Prediabetes    Unspecified hypothyroidism    Vitamin D deficiency    Past Surgical History:  Procedure  Laterality Date   APPENDECTOMY     BUNIONECTOMY     CATARACT EXTRACTION, BILATERAL     IR REMOVAL TUN ACCESS W/ PORT W/O FL MOD SED  03/04/2020   KNEE ARTHROSCOPY     TONSILLECTOMY     UMBILICAL HERNIA REPAIR     VIDEO BRONCHOSCOPY Bilateral 10/12/2017   Procedure: VIDEO BRONCHOSCOPY WITHOUT FLUORO;  Surgeon: Nyoka Cowden, MD;  Location: [facility];  Service: Endoscopy;  Laterality: Bilateral;   Family History  Problem Relation Age of Onset   Hypertension Mother    Thyroid disease Mother    Emphysema Father    Heart disease Father    Heart attack Father 67       Died   Rheum arthritis Sister    Heart disease Brother 58   Colon cancer Neg Hx    Stomach cancer Neg Hx    Sleep apnea Neg Hx    Social History   Socioeconomic History   Marital status: Married    Spouse name: Not on file   Number of children: 0   Years of education: 14   Highest education level: Not on file  Occupational History   Occupation: retired from office work  Tobacco Use   Smoking status: Never   Smokeless tobacco: Never   Tobacco comments:    Never Used Tobacco  Vaping Use   Vaping status: Never Used  Substance and Sexual Activity   Alcohol use: No    Alcohol/week: 0.0 standard drinks of alcohol   Drug use: No   Sexual activity: Not on file  Other Topics Concern   Not on file  Social History Narrative   Patient lives with her husband in a two story home.  Has no children.  Retired from office work.  Education: some college.    Social Drivers of Corporate investment banker Strain: Not on file  Food Insecurity: No Food Insecurity (08/17/2021)   Hunger Vital Sign    Worried About Running Out of Food in the Last Year: Never true    Ran Out of Food in the Last Year: Never true  Transportation Needs: Not on file  Physical Activity: Not on file  Stress: Not on file  Social Connections: Not on file    Tobacco Counseling Counseling given: Not Answered Tobacco comments: Never Used  Tobacco    Clinical Intake:              Lab Results  Component Value Date   HGBA1C 5.7 (H) 06/06/2023   HGBA1C 5.9 (H) 11/23/2022   HGBA1C 5.5 11/23/2021           Information entered by :: Donterrius Santucci, RMA   Activities of Daily Living     03/04/2023   11:24 AM  In your present state of health, do you have any difficulty performing the following activities:  Hearing? 0  Vision? 0  Difficulty concentrating or making decisions? 0  Walking or climbing stairs? 0  Dressing or bathing? 0  Doing errands, shopping? 0    Patient Care Team: Myrlene Broker, MD as PCP - General (Internal Medicine) Meryl Dare, MD (Inactive) as Consulting Physician (Gastroenterology) Jonelle Sidle, MD as Consulting Physician (Cardiology) Clance, Maree Krabbe, MD as Consulting Physician (Pulmonary Disease) Josph Macho, MD as Consulting Physician (Oncology) Charlett Nose, [facility] (Inactive) as Pharmacist (Pharmacist) Rollene Rotunda, MD as Consulting Physician (Cardiology)  Indicate any recent Medical Services you may have received from other than [facility] providers in the past year (date may be approximate).     Assessment:   This is a routine wellness examination for [place].  Hearing/Vision screen No results found.   Goals Addressed   None    Depression Screen     10/24/2023   10:17 AM 03/04/2023   11:30 AM 11/23/2022   10:13 PM 05/18/2022    4:43 PM 02/03/2022    2:24 PM 11/22/2021   10:19 PM 05/13/2021   10:52 AM  PHQ 2/9 Scores  PHQ - 2 Score 2 4 0 1 1 0 0  PHQ- 9 Score 5 10         Fall Risk     12/06/2023    1:06 PM 11/01/2023    3:56 PM 10/24/2023   10:17 AM 03/04/2023   11:30 AM 11/23/2022   10:12 PM  Fall Risk   Falls in the past year? 0 0 0 0 0  Number falls in past yr: 0 0 0 0   Injury with Fall? 0 0 0 0   Risk for fall due to :   No Fall Risks Impaired balance/gait No Fall Risks  Follow up Falls evaluation completed Falls evaluation completed  Falls evaluation completed Falls evaluation completed;Falls prevention discussed Falls evaluation completed;Education provided;Falls prevention discussed    MEDICARE RISK AT HOME:     TIMED UP AND GO:  Was the test performed?  Yes  Length of time to ambulate 10 feet: 15 sec Gait steady and fast without use of assistive device  Cognitive Function: 6CIT completed        Immunizations Immunization History  Administered Date(s) Administered   PFIZER(Purple Top)SARS-COV-2 Vaccination 10/29/2019, 11/19/2019, 08/06/2020   Pneumococcal Conjugate-13 03/18/2016   Pneumococcal Polysaccharide-23 06/18/2013   Zoster, Live 04/17/2013    Screening Tests Health Maintenance  Topic Date Due   Zoster Vaccines- Shingrix (1 of 2) 08/17/1962   COVID-19 Vaccine (4 - 2024-25 season) 05/15/2023   INFLUENZA VACCINE  12/12/2023 (Originally 04/14/2023)   Medicare Annual Wellness (AWV)  03/03/2024   Colonoscopy  05/26/2027   Pneumonia Vaccine 52+ Years old  Completed   DEXA SCAN  Completed   HPV VACCINES  Aged Out   DTaP/Tdap/Td  Discontinued   Hepatitis C Screening  Discontinued    Health Maintenance  Health Maintenance Due  Topic Date Due   Zoster Vaccines- Shingrix (1 of 2) 08/17/1962   COVID-19 Vaccine (4 - 2024-25 season) 05/15/2023   Health Maintenance Items Addressed: See Nurse Notes  Additional Screening:  Vision Screening: Recommended annual ophthalmology exams for early detection of glaucoma and other disorders of the eye.  Dental Screening: Recommended annual dental exams for proper oral hygiene  Community Resource Referral / Chronic Care Management: CRR required this visit?  No   CCM  required this visit?  No     Plan:     I have personally reviewed and noted the following in the patient's chart:   Medical and social history Use of alcohol, tobacco or illicit drugs  Current medications and supplements including opioid prescriptions. Patient is not currently taking  opioid prescriptions. Functional ability and status Nutritional status Physical activity Advanced directives List of other physicians Hospitalizations, surgeries, and ER visits in previous 12 months Vitals Screenings to include cognitive, depression, and falls Referrals and appointments  In addition, I have reviewed and discussed with patient certain preventive protocols, quality metrics, and best practice recommendations. A written personalized care plan for preventive services as well as general preventive health recommendations were provided to patient.     Finian Helvey L Boomer Winders, CMA   12/09/2023   After Visit Summary: (In Person-Printed) AVS printed and given to the patient  Notes: Nothing significant to report at this time.

## 2023-12-14 ENCOUNTER — Encounter: Payer: PPO | Admitting: Internal Medicine

## 2023-12-15 NOTE — Telephone Encounter (Unsigned)
 Copied from CRM [id number]. Topic: Clinical - Lab/Test Results >> Dec 15, 2023 10:35 AM Shannon Parsons L wrote: Reason for [id number] would like a call back in reference to labs taken on the 03/25

## 2023-12-16 ENCOUNTER — Ambulatory Visit: Payer: Self-pay

## 2023-12-16 NOTE — Telephone Encounter (Signed)
 Pt reports she is awaiting results for recent B12, pt also advised she requested a CBC at the time of this draw as well and would like to follow up. This RN educated pt on home care, new-worsening symptoms, when to call back/seek emergent care. Pt verbalized understanding and agrees to plan.   Copied from CRM [id number]. Topic: Clinical - Lab/Test Results >> Dec 16, 2023 11:16 AM Sim Boast F wrote: Reason for CRM: Patient calling to discuss b12 lab from 12/06/23 Reason for Disposition  Caller requesting lab results  (Exception: Routine or non-urgent lab result.)  Answer Assessment - Initial Assessment Questions 1. REASON FOR CALL or QUESTION: "What is your reason for calling today?" or "How can I best help you?" or "What question do you have that I can help answer?"     Results 2. CALLER: Document the source of call. (e.g., laboratory, patient).     Patient  Protocols used: PCP Call - No Triage-A-AH

## 2023-12-19 NOTE — Telephone Encounter (Signed)
 Copied from CRM [id number]. Topic: Clinical - Lab/Test Results >> Dec 16, 2023 11:16 AM Sim Boast F wrote: Reason for CRM: Patient calling to discuss b12 lab from 12/06/23 >> Dec 19, 2023  2:01 PM Clayton Bibles wrote: She wants to talk to a nurse about her labs. She wants to know what to do about her B12 and other labs. This is the 4th time that she has called.   Cell: [phone] Answer Assessment - Initial Assessment Questions 1. REASON FOR CALL or QUESTION: "What is your reason for calling today?" or "How can I best help you?" or "What question do you have that I can help answer?"     Pt called to get results from lab work on 12/06/2023 B12 and CBC: informed pt that at present time her PCP has not provided [place] with any information to provide the patient with regarding B12 results.  Also informed pt no CBC test was performed on 12/06/2023.  Pt stated she received a B12 injection on 12/06/2023 and the B12 lab results were to determine if patient was to continue to receive B12 shots or medication - pt wants to know the plan of care for this.  Pt also stated the CBC was to determine who she is to proceed with some of her other medication and would like to know how to proceed.  Pt seemed to be a little upset stating it has been a while since this test has been submitted and she should have answers by now.  Pt would like a  call back as soon as possible regarding lab results, B12 and when she will be scheduled for next set of lab work.  Protocols used: Information Only Call - No Triage-A-AH

## 2023-12-20 NOTE — Telephone Encounter (Signed)
 Please advise if as to what patient should do for her b12

## 2023-12-20 NOTE — Telephone Encounter (Signed)
 The B12 level was normal. Okay to take oral B12 but she likely does not need more B12 shots. This was the only test run that day.

## 2023-12-21 ENCOUNTER — Telehealth: Payer: Self-pay | Admitting: Internal Medicine

## 2023-12-21 NOTE — Telephone Encounter (Unsigned)
 Copied from CRM [id number]. Topic: Clinical - Lab/Test Results >> Dec 21, 2023 12:28 PM Melissa C wrote: Reason for CRM: patient called again regarding lab results as she stated she had not heard back from anyone. I was able to locate note in chart from 12/16/2023 regarding b12 and relayed that message on to patient, however she also wants to know when doctor/clinical team thinks she should have another blood draw done. I let her know I would pass that message along and ask that someone please give her a call. Thank you.

## 2023-12-22 NOTE — Telephone Encounter (Signed)
June or July

## 2023-12-22 NOTE — Telephone Encounter (Signed)
 Error closing encounter

## 2023-12-22 NOTE — Telephone Encounter (Signed)
 Patient would like blood work done for her cholesterol and thyroids and CBC she also would like to have a cancer screening/or blood work done

## 2023-12-22 NOTE — Telephone Encounter (Signed)
 Called patient in regards to this

## 2023-12-22 NOTE — Telephone Encounter (Signed)
 Please advise for patient if she need follow up on labs

## 2023-12-23 NOTE — Telephone Encounter (Signed)
 Cholesterol and thyroid were all checked in Dec so my recommendation for repeat would be around 6 months. Not due at this time.

## 2023-12-26 NOTE — Progress Notes (Signed)
 Subjective:   Shannon Parsons is a 83 y.o. who presents for a Medicare Wellness preventive visit.  Visit Complete: In person   Persons Participating in Visit: Patient.  AWV Questionnaire: No: Patient Medicare AWV questionnaire was not completed prior to this visit.  Cardiac Risk Factors include: advanced age (>77men, >56 women);Other (see comment);dyslipidemia, Risk factor comments: OSA     Objective:    Today's Vitals   12/09/23 1345  BP: 122/80  Pulse: 78  SpO2: 95%  Weight: 96 lb 12.8 oz (43.9 kg)  Height: 5' 3.5" (1.613 m)   Body mass index is 16.88 kg/m.     12/09/2023    1:22 PM 11/11/2023    6:13 PM 10/30/2023    4:06 PM 03/04/2023   11:29 AM 01/04/2023    8:21 AM 07/20/2022   10:59 AM 02/03/2022    2:22 PM  Advanced Directives  Does Patient Have a Medical Advance Directive? Yes Yes No Yes Yes Yes   Type of Estate agent of [place];Living will Healthcare Power of [place];Living will  Living will;Healthcare Power of [employer] Power of [employer];Living will Living will;Healthcare Power of [employer] Power of [place];Living will  Does patient want to make changes to medical advance directive? No - Patient declined   No - Patient declined No - Patient declined  No - Patient declined  Copy of Healthcare Power of Attorney in Chart? Yes - validated most recent copy scanned in chart (See row information)   No - copy requested  No - copy requested No - copy requested  Would patient like information on creating a medical advance directive?      No - Patient declined     Current Medications (verified) Outpatient Encounter Medications as of 12/09/2023  Medication Sig   acetaminophen (TYLENOL) 325 MG tablet Take 650 mg by mouth at bedtime as needed for mild pain.   albuterol (VENTOLIN HFA) 108 (90 Base) MCG/ACT inhaler Inhale 1-2 puffs into the lungs every 6 (six) hours as needed for wheezing or shortness of breath.    Budeson-Glycopyrrol-Formoterol (BREZTRI AEROSPHERE) 160-9-4.8 MCG/ACT AERO Inhale 2 puffs into the lungs 2 (two) times daily.   busPIRone (BUSPAR) 5 MG tablet Take 1 tablet (5 mg total) by mouth 2 (two) times daily as needed.   Cholecalciferol (VITAMIN D3) 50 MCG (2000 UT) capsule Take 4,000 Units by mouth daily.    hyoscyamine (LEVSIN SL) 0.125 MG SL tablet Take    1 to 2 tablets     every 4 hours       as needed for Nausea, Cramping, Bloating or Diarrhea DIARRHEA   levothyroxine (SYNTHROID) 50 MCG tablet TAKE 1 TABLET DAILY ON AN EMPTY STOMACH WITH ONLY WATER FOR 30 MINUTES. NO ANTACID MEDS, CALCIUM OR MAGNESIUM FOR 4 HOURS AND AVOID BIOTIN   Magnesium 250 MG TABS Take 1 tablet by mouth daily as needed.   metoprolol succinate (TOPROL-XL) 25 MG 24 hr tablet Take 1 tablet (25 mg total) by mouth daily.   ondansetron (ZOFRAN) 8 MG tablet Take 1/2 to 1 tablet 3 x /day (every 6 hours) if needed for Nausea   promethazine-dextromethorphan (PROMETHAZINE-DM) 6.25-15 MG/5ML syrup Take 5 mLs by mouth 4 (four) times daily as needed.   rosuvastatin (CRESTOR) 5 MG tablet TAKE 1/4 TABLET DAILY FOR CHOLESTEROL   Spacer/Aero-Holding Chambers (EASIVENT MASK SMALL) MISC See admin instructions.   vitamin C (ASCORBIC ACID) 500 MG tablet Take 500 mg by mouth every other day.   No  facility-administered encounter medications on file as of 12/09/2023.    Allergies (verified) Alendronate, Risedronate sodium, Sulfa antibiotics, Sulfasalazine, Other, and Tetracyclines & related   History: Past Medical History:  Diagnosis Date   Adenomatous colon polyp 1994   Asthma    Cataract 2013   bilateral eyes   Diverticulosis    Family history of ischemic heart disease    Fibrocystic breast disease    GERD (gastroesophageal reflux disease)    Hiatal hernia    Hyperlipidemia    IBS (irritable bowel syndrome)    Internal hemorrhoids    Mantle cell lymphoma (HCC) 10/18/2014   Melanoma (HCC)    Facial, pt denies on 01/16/18    OSA (obstructive sleep apnea)    Osteoarthritis    Osteoporosis    Prediabetes    Unspecified hypothyroidism    Vitamin D deficiency    Past Surgical History:  Procedure Laterality Date   APPENDECTOMY     BUNIONECTOMY     CATARACT EXTRACTION, BILATERAL     IR REMOVAL TUN ACCESS W/ PORT W/O FL MOD SED  03/04/2020   KNEE ARTHROSCOPY     TONSILLECTOMY     UMBILICAL HERNIA REPAIR     VIDEO BRONCHOSCOPY Bilateral 10/12/2017   Procedure: VIDEO BRONCHOSCOPY WITHOUT FLUORO;  Surgeon: Diamond Formica, MD;  Location: [facility];  Service: Endoscopy;  Laterality: Bilateral;   Family History  Problem Relation Age of Onset   Hypertension Mother    Thyroid disease Mother    Emphysema Father    Heart disease Father    Heart attack Father 90       Died   Rheum arthritis Sister    Heart disease Brother 3   Colon cancer Neg Hx    Stomach cancer Neg Hx    Sleep apnea Neg Hx    Social History   Socioeconomic History   Marital status: Married    Spouse name: Charles   Number of children: 0   Years of education: 14   Highest education level: Not on file  Occupational History   Occupation: retired from office work  Tobacco Use   Smoking status: Never   Smokeless tobacco: Never   Tobacco comments:    Never Used Tobacco  Vaping Use   Vaping status: Never Used  Substance and Sexual Activity   Alcohol use: No    Alcohol/week: 0.0 standard drinks of alcohol   Drug use: No   Sexual activity: Not on file  Other Topics Concern   Not on file  Social History Narrative   Patient lives with her husband in a two story home.  Has no children.  Retired from office work.  Education: some college.    Social Drivers of Corporate investment banker Strain: Low Risk  (12/09/2023)   Overall Financial Resource Strain (CARDIA)    Difficulty of Paying Living Expenses: Not hard at all  Food Insecurity: No Food Insecurity (12/09/2023)   Hunger Vital Sign    Worried About Running Out of Food in the  Last Year: Never true    Ran Out of Food in the Last Year: Never true  Transportation Needs: No Transportation Needs (12/09/2023)   PRAPARE - Administrator, Civil Service (Medical): No    Lack of Transportation (Non-Medical): No  Physical Activity: Insufficiently Active (12/09/2023)   Exercise Vital Sign    Days of Exercise per Week: 2 days    Minutes of Exercise per Session: 20 min  Stress:  No Stress Concern Present (12/09/2023)   [employer] of Occupational Health - Occupational Stress Questionnaire    Feeling of Stress : Not at all  Social Connections: Socially Integrated (12/09/2023)   Social Connection and Isolation Panel [NHANES]    Frequency of Communication with Friends and Family: More than three times a week    Frequency of Social Gatherings with Friends and Family: Twice a week    Attends Religious Services: More than 4 times per year    Active Member of [employer] or Organizations: Yes    Attends Banker Meetings: Never    Marital Status: Married    Tobacco Counseling Counseling given: Not Answered Tobacco comments: Never Used Tobacco    Clinical Intake:  Pre-visit preparation completed: Yes  Pain : No/denies pain     Diabetes: No  Lab Results  Component Value Date   HGBA1C 5.7 (H) 06/06/2023   HGBA1C 5.9 (H) 11/23/2022   HGBA1C 5.5 11/23/2021     How often do you need to have someone help you when you read instructions, pamphlets, or other written materials from your doctor or pharmacy?: 1 - Never  Interpreter Needed?: No  Information entered by :: Shannon Balthazar, RMA   Activities of Daily Living     12/09/2023    1:12 PM 03/04/2023   11:24 AM  In your present state of health, do you have any difficulty performing the following activities:  Hearing? 0 0  Vision? 0 0  Difficulty concentrating or making decisions? 0 0  Walking or climbing stairs? 0 0  Dressing or bathing? 0 0  Doing errands, shopping? 0 0  Preparing Food  and eating ? N   Using the Toilet? N   In the past six months, have you accidently leaked urine? N   Do you have problems with loss of bowel control? N   Managing your Medications? N   Managing your Finances? N   Housekeeping or managing your Housekeeping? N     Patient Care Team: Myrlene Broker, MD as PCP - General (Internal Medicine) Meryl Dare, MD (Inactive) as Consulting Physician (Gastroenterology) Jonelle Sidle, MD as Consulting Physician (Cardiology) Clance, Maree Krabbe, MD as Consulting Physician (Pulmonary Disease) Josph Macho, MD as Consulting Physician (Oncology) Charlett Nose, [facility] (Inactive) as Pharmacist (Pharmacist) Rollene Rotunda, MD as Consulting Physician (Cardiology) Pa, [facility]  Indicate any recent Medical Services you may have received from other than [facility] providers in the past year (date may be approximate).     Assessment:   This is a routine wellness examination for Rondo.  Hearing/Vision screen No results found.   Goals Addressed               This Visit's Progress     Patient Stated (pt-stated)        Would like to start walking agiain       Depression Screen     12/09/2023    1:26 PM 10/24/2023   10:17 AM 03/04/2023   11:30 AM 11/23/2022   10:13 PM 05/18/2022    4:43 PM 02/03/2022    2:24 PM 11/22/2021   10:19 PM  PHQ 2/9 Scores  PHQ - 2 Score 0 2 4 0 1 1 0  PHQ- 9 Score 0 5 10        Fall Risk     12/09/2023    1:22 PM 12/06/2023    1:06 PM 11/01/2023    3:56 PM 10/24/2023  10:17 AM 03/04/2023   11:30 AM  Fall Risk   Falls in the past year? 0 0 0 0 0  Number falls in past yr: 0 0 0 0 0  Injury with Fall? 0 0 0 0 0  Risk for fall due to : No Fall Risks   No Fall Risks Impaired balance/gait  Follow up Falls prevention discussed;Falls evaluation completed Falls evaluation completed Falls evaluation completed Falls evaluation completed Falls evaluation completed;Falls prevention discussed     MEDICARE RISK AT HOME:  Medicare Risk at Home Any stairs in or around the home?: Yes If so, are there any without handrails?: Yes Home free of loose throw rugs in walkways, pet beds, electrical cords, etc?: Yes Adequate lighting in your home to reduce risk of falls?: Yes Life alert?: No Use of a cane, walker or w/c?: No Grab bars in the bathroom?: Yes Shower chair or bench in shower?: No Elevated toilet seat or a handicapped toilet?: No  TIMED UP AND GO:  Was the test performed?  Yes  Length of time to ambulate 10 feet: 15 sec Gait steady and fast without use of assistive device  Cognitive Function: Normal: Normal cognitive status assessed by direct observation by this Clinical Health Advisor. No abnormalities found. Patient is able to answer questions in an accurate and timely manner.        Immunizations Immunization History  Administered Date(s) Administered   PFIZER(Purple Top)SARS-COV-2 Vaccination 10/29/2019, 11/19/2019, 08/06/2020   Pneumococcal Conjugate-13 03/18/2016   Pneumococcal Polysaccharide-23 06/18/2013   Zoster, Live 04/17/2013    Screening Tests Health Maintenance  Topic Date Due   Zoster Vaccines- Shingrix (1 of 2) 08/17/1962   COVID-19 Vaccine (4 - 2024-25 season) 05/15/2023   INFLUENZA VACCINE  04/13/2024   Medicare Annual Wellness (AWV)  12/08/2024   Colonoscopy  05/26/2027   Pneumonia Vaccine 46+ Years old  Completed   DEXA SCAN  Completed   HPV VACCINES  Aged Out   Meningococcal B Vaccine  Aged Out   DTaP/Tdap/Td  Discontinued   Hepatitis C Screening  Discontinued    Health Maintenance  Health Maintenance Due  Topic Date Due   Zoster Vaccines- Shingrix (1 of 2) 08/17/1962   COVID-19 Vaccine (4 - 2024-25 season) 05/15/2023   Health Maintenance Items Addressed: See Nurse Notes  Additional Screening:  Vision Screening: Recommended annual ophthalmology exams for early detection of glaucoma and other disorders of the eye.  Dental  Screening: Recommended annual dental exams for proper oral hygiene  Community Resource Referral / Chronic Care Management: CRR required this visit?  No   CCM required this visit?  No     Plan:     I have personally reviewed and noted the following in the patient's chart:   Medical and social history Use of alcohol, tobacco or illicit drugs  Current medications and supplements including opioid prescriptions. Patient is not currently taking opioid prescriptions. Functional ability and status Nutritional status Physical activity Advanced directives List of other physicians Hospitalizations, surgeries, and ER visits in previous 12 months Vitals Screenings to include cognitive, depression, and falls Referrals and appointments  In addition, I have reviewed and discussed with patient certain preventive protocols, quality metrics, and best practice recommendations. A written personalized care plan for preventive services as well as general preventive health recommendations were provided to patient.     Seif Teichert L Caroly Purewal, CMA   12/09/2023  After Visit Summary: (In Person-Printed) AVS printed and given to the patient  Notes: Nothing significant  to report at this time.

## 2023-12-26 NOTE — Telephone Encounter (Signed)
 Called patient back and scheduled a 6 month f/u

## 2024-01-12 NOTE — Progress Notes (Signed)
 Cardiology Office Note:   Date:  01/13/2024  ID:  Shannon Parsons, DOB 10-09-1940, MRN [id number] PCP: Adelia Homestead, MD  [facility] Providers Cardiologist:  None {  History of Present Illness:   Shannon Parsons is a 83 y.o. female who is for evaluation of chest pain.  I saw her in 2021 for chest pain.  This was an isolated event and she had no cardiac work up.    She has no prior cardiac stress test other than in 2015 she apparently had mantle cell lymphoma and had an echo during the course of her chemotherapy and I saw the results of this.  This was normal.  2016 there was some chest discomfort and she went to [facility].  She had a negative Lexiscan  Myoview.  She had subsequent follow-up echocardiography.  I also see that she had a CT scan in 2022 to rule out PE and it was no evidence of this.  I do note that there was again no mention of coronary calcium . I saw her in 2024 and she had chest pain with an equivocal POET (Plain Old Exercise Treadmill).  She was to have a PET scan but she cancelled this.    She reports that she is no longer having chest discomfort that she was having.  She has a lot of stress.  Her husband has memory disorders and anxiety.  She is worried about things like her bone density.  She does not sleep particularly well getting up 3 times at night to go to the bathroom.  She occasionally has some discomfort under her left breast but this is unchanged from previous.  She actually was in the emergency room in February and I did look at 2 visits that she had at that time.  She was there for some COPD exacerbation and I see that she had a CAT scan of her chest which actually did not mention any coronary calcium .  There is little aortic atherosclerosis.  She thinks her symptoms are related to the emotional stress as that is when they happen.  She can do housework and does try to do exercises without bringing on any of these symptoms.  She does not have any new PND or  orthopnea.  She has no new palpitations, presyncope or syncope.  ROS: Positive for resting tremor.  Otherwise as stated in the HPI and negative for all other systems.   Studies Reviewed:    EKG:   11/14/2023 sinus rhythm, rate 100, axis within normal limits, intervals within normal limits, nonspecific ST-T wave changes with baseline artifact.   Risk Assessment/Calculations:       Physical Exam:   VS:  BP (!) 150/78 (BP Location: Left Arm, Patient Position: Sitting, Cuff Size: Small)   Pulse 71   Ht 5\' 4"  (1.626 m)   Wt 95 lb 6.4 oz (43.3 kg)   LMP 09/14/2014   SpO2 96%   BMI 16.38 kg/m    Wt Readings from Last 3 Encounters:  01/13/24 95 lb 6.4 oz (43.3 kg)  12/09/23 96 lb 12.8 oz (43.9 kg)  12/06/23 94 lb (42.6 kg)     GEN: Well nourished, well developed in no acute distress NECK: No JVD; No carotid bruits CARDIAC: RRR, no murmurs, rubs, gallops RESPIRATORY:  Clear to auscultation without rales, wheezing or rhonchi  ABDOMEN: Soft, non-tender, non-distended EXTREMITIES:  No edema; No deformity   ASSESSMENT AND PLAN:   CHEST PAIN:     She had  an equivocal POET (Plain Old Exercise Treadmill).  However, she has no anginal symptoms at this point.  She had no coronary calcium  on a CT noted recently.  I think the pretest probability of obstructive coronary disease is low.  No further workup.  HTN: Her blood pressure is elevated today but this is unusual reviewing most recent readings.  She will keep an eye on this at home.     Follow up with me as needed.   Signed, Eilleen Grates, MD

## 2024-01-13 ENCOUNTER — Encounter: Payer: Self-pay | Admitting: Cardiology

## 2024-01-13 ENCOUNTER — Ambulatory Visit: Attending: Cardiology | Admitting: Cardiology

## 2024-01-13 VITALS — BP 150/78 | HR 71 | Ht 64.0 in | Wt 95.4 lb

## 2024-01-13 DIAGNOSIS — R072 Precordial pain: Secondary | ICD-10-CM

## 2024-01-13 NOTE — Patient Instructions (Signed)
 Medication Instructions:  Your physician recommends that you continue on your current medications as directed. Please refer to the Current Medication list given to you today.  *If you need a refill on your cardiac medications before your next appointment, please call your pharmacy*  Lab Work: NONE If you have labs (blood work) drawn today and your tests are completely normal, you will receive your results only by: MyChart Message (if you have MyChart) OR A paper copy in the mail If you have any lab test that is abnormal or we need to change your treatment, we will call you to review the results.  Testing/Procedures: NONE  Follow-Up: At [facility], you and your health needs are our priority.  As part of our continuing mission to provide you with exceptional heart care, our providers are all part of one team.  This team includes your primary Cardiologist (physician) and Advanced Practice Providers or APPs (Physician Assistants and Nurse Practitioners) who all work together to provide you with the care you need, when you need it.  Your next appointment:   As needed  Provider:   Lavonne Prairie, MD  We recommend signing up for the patient portal called "MyChart".  Sign up information is provided on this After Visit Summary.  MyChart is used to connect with patients for Virtual Visits (Telemedicine).  Patients are able to view lab/test results, encounter notes, upcoming appointments, etc.  Non-urgent messages can be sent to your provider as well.   To learn more about what you can do with MyChart, go to [email or web address].   Other Instructions

## 2024-01-30 ENCOUNTER — Telehealth: Payer: Self-pay | Admitting: Internal Medicine

## 2024-01-30 DIAGNOSIS — C8310 Mantle cell lymphoma, unspecified site: Secondary | ICD-10-CM

## 2024-01-30 NOTE — Telephone Encounter (Signed)
 Copied from CRM [id number]. Topic: Clinical - Request for Lab/Test Order >> Jan 30, 2024 11:36 AM Jayson Michael wrote: Reason for CRM: Patient called to confirm her appointment on 03/05/2024 at 1:00 PM with Bambi Lever. She is requesting that CBC labs be ordered in advance for that visit. Please advise if this can be accommodated. Patient callback [phone]

## 2024-01-31 NOTE — Telephone Encounter (Signed)
 Labs placed.

## 2024-02-01 NOTE — Telephone Encounter (Signed)
 Called patient and informed her.

## 2024-02-09 ENCOUNTER — Other Ambulatory Visit: Payer: Self-pay | Admitting: Internal Medicine

## 2024-02-09 DIAGNOSIS — Z1231 Encounter for screening mammogram for malignant neoplasm of breast: Secondary | ICD-10-CM

## 2024-02-16 ENCOUNTER — Other Ambulatory Visit: Payer: Self-pay | Admitting: Internal Medicine

## 2024-02-16 DIAGNOSIS — E782 Mixed hyperlipidemia: Secondary | ICD-10-CM

## 2024-02-16 NOTE — Telephone Encounter (Signed)
 Copied from CRM [id number]. Topic: Clinical - Medication Refill >> Feb 16, 2024  2:11 PM [name] wrote: Medication: rosuvastatin  (CRESTOR ) 5 MG tablet   This medication was prescribed by a Specialist but he has passed away.  Has the patient contacted their pharmacy? Yes  This is the patient's preferred pharmacy:  [facility] - [place], [place] - [address] [place] [address] Phone: [phone] Fax: [phone]  Is this the correct pharmacy for this prescription? Yes  Has the prescription been filled recently? No  Is the patient out of the medication? Yes, quarter of a pill left.   Has the patient been seen for an appointment in the last year OR does the patient have an upcoming appointment? Yes  Can we respond through MyChart? No  Agent: Please be advised that Rx refills may take up to 3 business days. We ask that you follow-up with your pharmacy.

## 2024-02-27 ENCOUNTER — Other Ambulatory Visit (INDEPENDENT_AMBULATORY_CARE_PROVIDER_SITE_OTHER)

## 2024-02-27 DIAGNOSIS — C8310 Mantle cell lymphoma, unspecified site: Secondary | ICD-10-CM | POA: Diagnosis not present

## 2024-02-27 LAB — COMPREHENSIVE METABOLIC PANEL WITH GFR
ALT: 18 U/L (ref 0–35)
AST: 34 U/L (ref 0–37)
Albumin: 4.3 g/dL (ref 3.5–5.2)
Alkaline Phosphatase: 48 U/L (ref 39–117)
BUN: 21 mg/dL (ref 6–23)
CO2: 30 meq/L (ref 19–32)
Calcium: 9.7 mg/dL (ref 8.4–10.5)
Chloride: 101 meq/L (ref 96–112)
Creatinine, Ser: 1.08 mg/dL (ref 0.40–1.20)
GFR: 48.51 mL/min — ABNORMAL LOW (ref >60.00)
Glucose, Bld: 84 mg/dL (ref 70–99)
Potassium: 4 meq/L (ref 3.5–5.1)
Sodium: 137 meq/L (ref 135–145)
Total Bilirubin: 0.5 mg/dL (ref 0.2–1.2)
Total Protein: 6.2 g/dL (ref 6.0–8.3)

## 2024-02-27 LAB — CBC
HCT: 40.6 % (ref 36.0–46.0)
Hemoglobin: 13.5 g/dL (ref 12.0–15.0)
MCHC: 33.2 g/dL (ref 30.0–36.0)
MCV: 89 fl (ref 78.0–100.0)
Platelets: 168 K/uL (ref 150.0–400.0)
RBC: 4.56 Mil/uL (ref 3.87–5.11)
RDW: 13.1 % (ref 11.5–15.5)
WBC: 4.2 K/uL (ref 4.0–10.5)

## 2024-03-02 ENCOUNTER — Ambulatory Visit: Admitting: Internal Medicine

## 2024-03-05 ENCOUNTER — Telehealth: Payer: Self-pay | Admitting: Internal Medicine

## 2024-03-05 ENCOUNTER — Ambulatory Visit: Admitting: Internal Medicine

## 2024-03-05 ENCOUNTER — Ambulatory Visit: Payer: Self-pay | Admitting: Internal Medicine

## 2024-03-05 ENCOUNTER — Encounter: Payer: Self-pay | Admitting: Internal Medicine

## 2024-03-05 VITALS — BP 120/82 | HR 76 | Temp 98.4°F | Ht 64.0 in | Wt 96.1 lb

## 2024-03-05 DIAGNOSIS — E559 Vitamin D deficiency, unspecified: Secondary | ICD-10-CM | POA: Diagnosis not present

## 2024-03-05 DIAGNOSIS — J479 Bronchiectasis, uncomplicated: Secondary | ICD-10-CM | POA: Diagnosis not present

## 2024-03-05 DIAGNOSIS — Z0000 Encounter for general adult medical examination without abnormal findings: Secondary | ICD-10-CM

## 2024-03-05 DIAGNOSIS — D801 Nonfamilial hypogammaglobulinemia: Secondary | ICD-10-CM | POA: Diagnosis not present

## 2024-03-05 DIAGNOSIS — I10 Essential (primary) hypertension: Secondary | ICD-10-CM

## 2024-03-05 DIAGNOSIS — F411 Generalized anxiety disorder: Secondary | ICD-10-CM | POA: Diagnosis not present

## 2024-03-05 DIAGNOSIS — E038 Other specified hypothyroidism: Secondary | ICD-10-CM

## 2024-03-05 DIAGNOSIS — C8310 Mantle cell lymphoma, unspecified site: Secondary | ICD-10-CM

## 2024-03-05 DIAGNOSIS — M810 Age-related osteoporosis without current pathological fracture: Secondary | ICD-10-CM

## 2024-03-05 DIAGNOSIS — E538 Deficiency of other specified B group vitamins: Secondary | ICD-10-CM | POA: Diagnosis not present

## 2024-03-05 DIAGNOSIS — E782 Mixed hyperlipidemia: Secondary | ICD-10-CM | POA: Diagnosis not present

## 2024-03-05 DIAGNOSIS — R251 Tremor, unspecified: Secondary | ICD-10-CM

## 2024-03-05 DIAGNOSIS — R636 Underweight: Secondary | ICD-10-CM

## 2024-03-05 LAB — TSH: TSH: 1.96 u[IU]/mL (ref 0.35–5.50)

## 2024-03-05 LAB — VITAMIN D 25 HYDROXY (VIT D DEFICIENCY, FRACTURES): VITD: 94.38 ng/mL (ref 30.00–100.00)

## 2024-03-05 LAB — T4, FREE: Free T4: 0.94 ng/dL (ref 0.60–1.60)

## 2024-03-05 LAB — VITAMIN B12: Vitamin B-12: 1239 pg/mL — ABNORMAL HIGH (ref 211–911)

## 2024-03-05 NOTE — Progress Notes (Unsigned)
   Subjective:   Patient ID: Shannon Parsons, female    DOB: 26-Sep-1940, 83 y.o.   MRN: [id number]  HPI The patient is here for physical.  PMH, [facility], social history reviewed and updated  Review of Systems  Objective:  Physical Exam  Vitals:   03/05/24 1258  BP: 120/82  Pulse: 76  Temp: 98.4 F (36.9 C)  TempSrc: Oral  SpO2: 96%  Weight: 96 lb 2 oz (43.6 kg)  Height: 5' 4 (1.626 m)    Assessment & Plan:

## 2024-03-05 NOTE — Telephone Encounter (Unsigned)
 Copied from CRM [id number]. Topic: Referral - Status >> Mar 05, 2024  4:30 PM Gennette ORN wrote: Reason for CRM: [facility] [phone]  needs more information from Dr. Rollene for this referral to be processed.

## 2024-03-05 NOTE — Patient Instructions (Addendum)
 We will check the vitamin and thyroid  levels.   We will check the bone density.   After this mammogram we will wait a few years to do another.

## 2024-03-06 ENCOUNTER — Ambulatory Visit: Payer: Self-pay | Admitting: Internal Medicine

## 2024-03-06 ENCOUNTER — Ambulatory Visit: Payer: PPO | Admitting: Nurse Practitioner

## 2024-03-08 ENCOUNTER — Encounter: Payer: Self-pay | Admitting: Gastroenterology

## 2024-03-08 ENCOUNTER — Ambulatory Visit: Admitting: Gastroenterology

## 2024-03-08 ENCOUNTER — Encounter: Payer: Self-pay | Admitting: Internal Medicine

## 2024-03-08 VITALS — BP 130/78 | HR 72 | Ht 64.0 in | Wt 95.8 lb

## 2024-03-08 DIAGNOSIS — F419 Anxiety disorder, unspecified: Secondary | ICD-10-CM | POA: Diagnosis not present

## 2024-03-08 DIAGNOSIS — K589 Irritable bowel syndrome without diarrhea: Secondary | ICD-10-CM | POA: Diagnosis not present

## 2024-03-08 DIAGNOSIS — R251 Tremor, unspecified: Secondary | ICD-10-CM | POA: Diagnosis not present

## 2024-03-08 DIAGNOSIS — K638219 Small intestinal bacterial overgrowth, unspecified: Secondary | ICD-10-CM | POA: Diagnosis not present

## 2024-03-08 MED ORDER — HYOSCYAMINE SULFATE 0.125 MG SL SUBL: SUBLINGUAL_TABLET | SUBLINGUAL | 3 refills | Status: AC

## 2024-03-08 MED ORDER — CIPROFLOXACIN HCL 500 MG PO TABS: 500.0000 mg | ORAL_TABLET | Freq: Two times a day (BID) | ORAL | 0 refills | Status: DC

## 2024-03-08 NOTE — Assessment & Plan Note (Signed)
 Checking DEXA and if worse will counsel about treatment. She has previously declined with prior PCP.

## 2024-03-08 NOTE — Assessment & Plan Note (Signed)
 Up to date on CBC and stable. No new compressive symptoms.

## 2024-03-08 NOTE — Assessment & Plan Note (Signed)
 Using buspar  and this is helping some.

## 2024-03-08 NOTE — Progress Notes (Signed)
 Discussed the use of AI scribe software for clinical note transcription with the patient, who gave verbal consent to proceed.  HPI : Shannon Parsons is an 83 year old female with irritable bowel syndrome and SIBO who presents with worsening abdominal bloating and pain.  She has a history of irritable bowel syndrome (IBS) characterized by abdominal pain, bloating, and cramping. These symptoms have been present daily, with bloating and distention severe enough to prevent her from zipping her jeans.  She experiences significant stress related to her husband's memory problems, which he denies and refuses to seek medical attention for.  She believes stress exacerbates her GI symptoms.  She was previously diagnosed with small intestinal bacterial overgrowth (SIBO) via breath test and treated with ciprofloxacin  in 2023, which provided relief. Rifaximin  was initially recommended but was too expensive.  However, her symptoms of bloating have returned. Her bowel movements are generally regular, occurring once a day, though she occasionally experiences constipation.  She takes Levsin  as needed, which helps with her pain, and she has used it several times recently due to increased symptoms.   She also takes buspirone  for anxiety, which she has been on for at least two months, and Valium  as needed for anxiety-related tremors.  Her diet has been inconsistent, often opting for quick snacks like crackers due to stress and anxiety.  Her weight has been stable between 92 to 93 pounds, though she lost weight several years ago after her lymphoma diagnosis and has not regained it.  No issues with reflux or heartburn are reported.      CT Abd/pelvis Oct 30, 2023 IMPRESSION: 1. Colonic diverticulosis with long segment borderline-mild wall thickening of the sigmoid colon. No focally inflamed diverticulum is identified. Findings may represent a mild colitis. 2. Mild bronchial wall thickening with subtle  peribronchovascular nodularity in the bilateral lung bases, suggestive of an infectious or inflammatory bronchiolitis. 3. Trace free fluid within the pelvis, likely reactive. 4. Aortic atherosclerosis (ICD10-I70.0).   Colonoscopy May 25, 2022 (Dr. Aneita) Indication: Positive Cologuard 4 sessile polyps in the rectum, descending colon and transverse colon, 6-9 mm Left-sided diverticulosis Recommended against further colonoscopy  Surgical [P], colon, descending, transverse and rectal, polyp (4) - TUBULAR ADENOMAS, FOUR ADENOMATOUS PORTIONS, NEGATIVE FOR HIGH-GRADE DYSPLASIA. - HYPERPLASTIC POLYP.  EGD Aug 2015 1. Stricture at the gastroesophageal junction 2. Small hiatal hernia 3. Erosive gastritis in the gastric antrum; multiple biopsies 4. Gastritis in the gastric body; multiple biopsies  Breath Test:  Abnormal [facility]) Oct 2023  Past Medical History:  Diagnosis Date   Adenomatous colon polyp 1994   Asthma    Cataract 2013   bilateral eyes   Diverticulosis    Family history of ischemic heart disease    Fibrocystic breast disease    GERD (gastroesophageal reflux disease)    Hiatal hernia    Hyperlipidemia    IBS (irritable bowel syndrome)    Internal hemorrhoids    Mantle cell lymphoma (HCC) 10/18/2014   Melanoma (HCC)    Facial, pt denies on 01/16/18   OSA (obstructive sleep apnea)    Osteoarthritis    Osteoporosis    Prediabetes    Unspecified hypothyroidism    Vitamin D  deficiency      Past Surgical History:  Procedure Laterality Date   APPENDECTOMY     BUNIONECTOMY     CATARACT EXTRACTION, BILATERAL     IR REMOVAL TUN ACCESS W/ PORT W/O FL MOD SED  03/04/2020   KNEE ARTHROSCOPY  TONSILLECTOMY     UMBILICAL HERNIA REPAIR     VIDEO BRONCHOSCOPY Bilateral 10/12/2017   Procedure: VIDEO BRONCHOSCOPY WITHOUT FLUORO;  Surgeon: Darlean Ozell NOVAK, MD;  Location: [facility];  Service: Endoscopy;  Laterality: Bilateral;   Family History  Problem  Relation Age of Onset   Hypertension Mother    Thyroid  disease Mother    Emphysema Father    Heart disease Father    Heart attack Father 20       Died   Rheum arthritis Sister    Heart disease Brother 15   Colon cancer Neg Hx    Stomach cancer Neg Hx    Sleep apnea Neg Hx    Social History   Tobacco Use   Smoking status: Never   Smokeless tobacco: Never   Tobacco comments:    Never Used Tobacco  Vaping Use   Vaping status: Never Used  Substance Use Topics   Alcohol use: No    Alcohol/week: 0.0 standard drinks of alcohol   Drug use: No   Current Outpatient Medications  Medication Sig Dispense Refill   acetaminophen  (TYLENOL ) 325 MG tablet Take 650 mg by mouth at bedtime as needed for mild pain.     albuterol  (VENTOLIN  HFA) 108 (90 Base) MCG/ACT inhaler Inhale 1-2 puffs into the lungs every 6 (six) hours as needed for wheezing or shortness of breath. 18 g 0   Budeson-Glycopyrrol-Formoterol  (BREZTRI  AEROSPHERE) 160-9-4.8 MCG/ACT AERO Inhale 2 puffs into the lungs 2 (two) times daily. 10.7 g 11   busPIRone  (BUSPAR ) 5 MG tablet Take 1 tablet (5 mg total) by mouth 2 (two) times daily as needed. 60 tablet 3   Cholecalciferol (VITAMIN D3) 50 MCG (2000 UT) capsule Take 4,000 Units by mouth daily.      hyoscyamine  (LEVSIN  SL) 0.125 MG SL tablet Take    1 to 2 tablets     every 4 hours       as needed for Nausea, Cramping, Bloating or Diarrhea DIARRHEA 90 tablet 0   levothyroxine  (SYNTHROID ) 50 MCG tablet TAKE 1 TABLET DAILY ON AN EMPTY STOMACH WITH ONLY WATER FOR 30 MINUTES. NO ANTACID MEDS, CALCIUM  OR MAGNESIUM FOR 4 HOURS AND AVOID BIOTIN 90 tablet 3   Magnesium 250 MG TABS Take 1 tablet by mouth daily as needed.     metoprolol  succinate (TOPROL -XL) 25 MG 24 hr tablet Take 1 tablet (25 mg total) by mouth daily. (Patient not taking: Reported on 03/05/2024) 90 tablet 3   ondansetron  (ZOFRAN ) 8 MG tablet Take 1/2 to 1 tablet 3 x /day (every 6 hours) if needed for Nausea 30 tablet 0    rosuvastatin  (CRESTOR ) 5 MG tablet TAKE 1/4 TABLET BY MOUTH DAILY FOR CHOLESTEROL 23 tablet 1   Spacer/Aero-Holding Chambers (EASIVENT MASK SMALL) MISC See admin instructions.     vitamin C (ASCORBIC ACID) 500 MG tablet Take 500 mg by mouth every other day.     No current facility-administered medications for this visit.   Allergies  Allergen Reactions   Alendronate Other (See Comments)    made me choke  made me choke, chokes   Risedronate Sodium Other (See Comments)    made me choke     Sulfa Antibiotics Other (See Comments)    Doesn't remember    Sulfasalazine Other (See Comments)    Doesn't remember    Other Rash   Tetracyclines & Related Rash     Review of Systems: All systems reviewed and negative except where noted in  HPI.    No results found.  Physical Exam: BP 130/78   Pulse 72   Ht 5' 4 (1.626 m)   Wt 95 lb 12.8 oz (43.5 kg)   LMP 09/14/2014   BMI 16.44 kg/m  Constitutional: Pleasant,well-developed, thin Caucasian female in no acute distress. HEENT: Normocephalic and atraumatic. Conjunctivae are normal. No scleral icterus. Neck supple.  Cardiovascular: Normal rate, regular rhythm.  Pulmonary/chest: Effort normal and breath sounds normal. No wheezing, rales or rhonchi. Abdominal: Soft, nondistended, multifocal tenderness to palpation in the LLQ, LUQ, epigastrium and periumbilical region without rigidity or guarding. Bowel sounds active throughout. There are no masses palpable. No hepatomegaly. Extremities: no edema Neurological: Alert and oriented to person place and time.  Tremulous movements of head throughout encounter Skin: Skin is warm and dry. No rashes noted. Psychiatric: Normal mood and affect. Behavior is normal.  CBC    Component Value Date/Time   WBC 4.2 02/27/2024 1148   RBC 4.56 02/27/2024 1148   HGB 13.5 02/27/2024 1148   HGB 13.7 01/04/2023 0804   HGB 13.0 07/13/2017 0743   HCT 40.6 02/27/2024 1148   HCT 39.0 07/13/2017 0743    PLT 168.0 02/27/2024 1148   PLT 205 01/04/2023 0804   PLT 202 07/13/2017 0743   MCV 89.0 02/27/2024 1148   MCV 91 07/13/2017 0743   MCH 28.8 11/11/2023 1834   MCHC 33.2 02/27/2024 1148   RDW 13.1 02/27/2024 1148   RDW 13.4 07/13/2017 0743   LYMPHSABS 1.2 11/11/2023 1834   LYMPHSABS 0.7 (L) 07/13/2017 0743   MONOABS 0.6 11/11/2023 1834   EOSABS 0.1 11/11/2023 1834   EOSABS 0.2 07/13/2017 0743   BASOSABS 0.0 11/11/2023 1834   BASOSABS 0.1 07/13/2017 0743    CMP     Component Value Date/Time   NA 137 02/27/2024 1148   NA 141 07/13/2017 0743   NA 138 08/01/2015 1124   K 4.0 02/27/2024 1148   K 4.2 07/13/2017 0743   K 4.0 08/01/2015 1124   CL 101 02/27/2024 1148   CL 105 07/13/2017 0743   CO2 30 02/27/2024 1148   CO2 27 07/13/2017 0743   CO2 28 08/01/2015 1124   GLUCOSE 84 02/27/2024 1148   GLUCOSE 109 07/13/2017 0743   BUN 21 02/27/2024 1148   BUN 16 07/13/2017 0743   BUN 10.4 08/01/2015 1124   CREATININE 1.08 02/27/2024 1148   CREATININE 1.31 (H) 09/13/2023 1143   CREATININE 0.8 08/01/2015 1124   CALCIUM  9.7 02/27/2024 1148   CALCIUM  9.5 07/13/2017 0743   CALCIUM  9.6 08/01/2015 1124   PROT 6.2 02/27/2024 1148   PROT 6.0 (L) 07/13/2017 0743   PROT 6.1 (L) 08/01/2015 1124   ALBUMIN 4.3 02/27/2024 1148   ALBUMIN 3.5 07/13/2017 0743   ALBUMIN 3.6 08/01/2015 1124   AST 34 02/27/2024 1148   AST 32 01/04/2023 0804   AST 23 08/01/2015 1124   ALT 18 02/27/2024 1148   ALT 17 01/04/2023 0804   ALT 19 07/13/2017 0743   ALT 12 08/01/2015 1124   ALKPHOS 48 02/27/2024 1148   ALKPHOS 68 07/13/2017 0743   ALKPHOS 88 08/01/2015 1124   BILITOT 0.5 02/27/2024 1148   BILITOT 0.5 01/04/2023 0804   BILITOT <0.30 08/01/2015 1124   GFRNONAA 56 (L) 11/11/2023 1834   GFRNONAA 52 (L) 01/04/2023 0804   GFRNONAA 59 (L) 11/10/2020 0957   GFRAA 68 11/10/2020 0957       Latest Ref Rng & Units 02/27/2024   11:48 AM 11/11/2023  6:34 PM 10/30/2023    4:21 PM  CBC EXTENDED  WBC 4.0 -  10.5 K/uL 4.2  5.6  5.6   RBC 3.87 - 5.11 Mil/uL 4.56  5.10  5.69   Hemoglobin 12.0 - 15.0 g/dL 86.4  85.2  83.4   HCT 36.0 - 46.0 % 40.6  44.9  51.4   Platelets 150.0 - 400.0 K/uL 168.0  257  127   NEUT# 1.7 - 7.7 K/uL  3.7  3.8   Lymph# 0.7 - 4.0 K/uL  1.2  0.9       ASSESSMENT AND PLAN:  83 year old female longstanding IBS symptoms breath test suggestive of SIBO, also with anxiety and stress related to her husband with recent exacerbation of abdominal pain and bloating/distention.  Irritable Bowel Syndrome Chronic IBS with stress-related exacerbations. Regular bowel movements, occasional constipation. Diet and stress management are key factors. - Provided handout on low FODMAP diet. - Discussed dietary modifications to reduce gas-producing foods. - Renewed Levsin  prescription for 60 days with three refills.  Small Intestinal Bacterial Overgrowth (SIBO) SIBO recurrence with bloating and distention. Ciprofloxacin  prescribed due to previous efficacy. Rifaximin  not prescribed due to cost. - Prescribed ciprofloxacin  500 mg BID for 10 days.  Anxiety Anxiety exacerbating gastrointestinal symptoms, managed with buspirone  and diazepam . Stress from husband's health is significant.  Tremor Intermittent head and neck tremor, worsened by anxiety. Managed with diazepam  as needed.  Recording duration: 15 minutes     Juwan Vences E. Stacia, MD [facility]    Rollene Almarie LABOR, *

## 2024-03-08 NOTE — Assessment & Plan Note (Signed)
 BP stable on regimen no meds currently. We had tried to start metoprolol  recently but she did not start.

## 2024-03-08 NOTE — Assessment & Plan Note (Signed)
 Recent lipid panel at goal. Continue crestor .

## 2024-03-08 NOTE — Assessment & Plan Note (Signed)
 Checking vitamin D and adjust as needed.

## 2024-03-08 NOTE — Assessment & Plan Note (Signed)
 This seems mildly improved on exam with less albuterol  usage. Continue to monitor.

## 2024-03-08 NOTE — Assessment & Plan Note (Signed)
 Stable cough and SOB and using inhalers. Functionally improved from beginning of year. Continue.

## 2024-03-08 NOTE — Patient Instructions (Signed)
 We have sent the following medications to your pharmacy for you to pick up at your convenience:  Cipro , Levsin .  Please follow up in one year.  _______________________________________________________  If your blood pressure at your visit was 140/90 or greater, please contact your primary care physician to follow up on this.  _______________________________________________________  If you are age 81 or older, your body mass index should be between 23-30. Your Body mass index is 16.44 kg/m. If this is out of the aforementioned range listed, please consider follow up with your Primary Care Provider.  If you are age 1 or younger, your body mass index should be between 19-25. Your Body mass index is 16.44 kg/m. If this is out of the aformentioned range listed, please consider follow up with your Primary Care Provider.   ________________________________________________________  The [facility] providers would like to encourage you to use MYCHART to communicate with providers for non-urgent requests or questions.  Due to long hold times on the telephone, sending your provider a message by [facility] may be a faster and more efficient way to get a response.  Please allow 48 business hours for a response.  Please remember that this is for non-urgent requests.  _______________________________________________________

## 2024-03-08 NOTE — Assessment & Plan Note (Signed)
 Stable and up to date on labs. No current infection.

## 2024-03-08 NOTE — Assessment & Plan Note (Signed)
Checking B12 level and adjust as needed.  

## 2024-03-08 NOTE — Assessment & Plan Note (Signed)
 Checking TSH and free T4 given new hair loss.

## 2024-03-08 NOTE — Assessment & Plan Note (Signed)
 Flu shot yearly. Pneumonia complete. Shingrix complete. Tetanus up to date. Colonoscopy aged out. Mammogram aged out, pap smear aged out and dexa ordered. Counseled about sun safety and mole surveillance. Counseled about the dangers of distracted driving. Given 10 year screening recommendations.

## 2024-03-12 NOTE — Telephone Encounter (Signed)
 I don't think she needs the diabetes management program so the reason on referral is what she needs.

## 2024-03-12 NOTE — Telephone Encounter (Signed)
 I went and read the notes from the referral and this was stated and we can add her to waitlist, but will need the prediabetes diagnosis added to the referral for insurance purposes. 03/05/24 KB

## 2024-03-15 ENCOUNTER — Ambulatory Visit: Payer: PPO | Admitting: Nurse Practitioner

## 2024-03-19 ENCOUNTER — Ambulatory Visit
Admission: RE | Admit: 2024-03-19 | Discharge: 2024-03-19 | Disposition: A | Discharge status: Home / Self Care | Source: Ambulatory Visit | Attending: Internal Medicine | Admitting: Internal Medicine

## 2024-03-19 ENCOUNTER — Telehealth: Payer: Self-pay

## 2024-03-19 DIAGNOSIS — Z1231 Encounter for screening mammogram for malignant neoplasm of breast: Secondary | ICD-10-CM | POA: Diagnosis not present

## 2024-03-19 NOTE — Telephone Encounter (Signed)
 Copied from CRM [id number]. Topic: Referral - Question >> Mar 19, 2024 11:57 AM Shannon Parsons wrote: Reason for CRM: Patient is calling in regarding the referral on her AVS for a nutritionist doctor and a bone density scan patient has not heard anything regarding either of these and is asking if Dr. Rollene would still them.

## 2024-03-21 NOTE — Telephone Encounter (Signed)
 Both were ordered I cannot help here referrals may be able to help

## 2024-03-21 NOTE — Telephone Encounter (Signed)
 Patient as been placed on the waiting list is patient due for her bone density

## 2024-03-23 ENCOUNTER — Other Ambulatory Visit: Payer: Self-pay | Admitting: Internal Medicine

## 2024-03-23 DIAGNOSIS — R928 Other abnormal and inconclusive findings on diagnostic imaging of breast: Secondary | ICD-10-CM

## 2024-03-26 ENCOUNTER — Telehealth: Payer: Self-pay | Admitting: Internal Medicine

## 2024-03-26 NOTE — Telephone Encounter (Signed)
 The note I had gotten from nutrition was they would enroll her in the diabetes program if I added pre-diabetes but this is not why she wants nutritional counseling so I declined this request. Does she want to do the diabetes program? Otherwise her insurance will not cover nutrition counseling for the reason she needs she would need to pay out of pocket.

## 2024-03-26 NOTE — Telephone Encounter (Signed)
 This is a prn medication. How is she taking it so we can advise?

## 2024-03-26 NOTE — Telephone Encounter (Signed)
 Ok to just stop does not need to wean off.

## 2024-03-26 NOTE — Telephone Encounter (Signed)
 Looked at nutritionist referral note and pt can be seen if we add prediabetes diagnosis to the referral for insurance purposes. Please add dx.  In order for pt to not wait for DEXA call we can change location to [facility] and have our front desk schedule her. Are you ok if I change order location or does it need to be [facility] center?

## 2024-03-26 NOTE — Telephone Encounter (Signed)
 Copied from CRM [id number]. Topic: Clinical - Medication Question >> Mar 26, 2024 10:03 AM Rea C wrote: Reason for CRM: Patient would like to discontinue busPIRone  (BUSPAR ) 5 MG tablet and would like counsel on a schedule to discontinue it.  [phone] (H)

## 2024-03-26 NOTE — Telephone Encounter (Signed)
 Called pt and she reports she was taking it twice a day starting march but then switched to once a day about a month ago however every once in a while she will take a 2nd pill before bed

## 2024-03-26 NOTE — Telephone Encounter (Signed)
 She would need to do DEXA at same location for us  to compare results it would be worth waiting to have it done same location.

## 2024-03-26 NOTE — Telephone Encounter (Signed)
 Copied from CRM [id number]. Topic: General - Call Back - No Documentation >> Mar 26, 2024 10:08 AM Rea C wrote: Reason for CRM: Patient called in to follow up on whether she needs a dietician/nutritionist; and the bone density scan.  [phone] (H)

## 2024-03-28 NOTE — Telephone Encounter (Signed)
 Called patient and LVM.

## 2024-03-30 ENCOUNTER — Ambulatory Visit
Admission: RE | Admit: 2024-03-30 | Discharge: 2024-03-30 | Disposition: A | Discharge status: Home / Self Care | Source: Ambulatory Visit | Attending: Internal Medicine | Admitting: Internal Medicine

## 2024-03-30 DIAGNOSIS — R921 Mammographic calcification found on diagnostic imaging of breast: Secondary | ICD-10-CM | POA: Diagnosis not present

## 2024-03-30 DIAGNOSIS — R928 Other abnormal and inconclusive findings on diagnostic imaging of breast: Secondary | ICD-10-CM

## 2024-04-11 ENCOUNTER — Ambulatory Visit: Payer: Self-pay

## 2024-04-11 NOTE — Telephone Encounter (Signed)
 FYI Only or Action Required?: Action required by provider: update on patient condition.  Patient was last seen in primary care on 03/05/2024 by Rollene Almarie LABOR, MD.  Called Nurse Triage reporting Fatigue.  Symptoms began a week ago.  Interventions attempted: Rest, hydration, or home remedies.  Symptoms are: gradually worsening.  Triage Disposition: See PCP When Office is Open (Within 3 Days)  Patient/caregiver understands and will follow disposition?: Yes Reason for Disposition  [1] MODERATE pain (e.g., interferes with normal activities, limping) AND [2] present > 3 days  Answer Assessment - Initial Assessment Questions Patient reports one week hx of intermittent bilateral lower leg and foot cramping. Electrolytes WNL in June and denies recent illness. States she maintains good hydration and eats bananas regularly. Scheduled for first available appointment on 04/13/24. Attempted to recommend patient be seen sooner in UC, but she stated she would prefer to wait to be seen in office on Friday.   This RN advised patient to call us  with any changes and ED precautions were reviewed including: loss or change in extremity sensation, palpitations, chest pain, shortness of breath, increase in frequency or severity of cramping or worsening of fatigue. Pt verbalized understanding.  1. ONSET: When did the pain start?      One week  2. LOCATION: Where is the pain located?      Bilateral leg cramping  3. PAIN: How bad is the pain?    (Scale 1-10; or mild, moderate, severe)     8/10 when cramping occurs, not currently painful  6. OTHER SYMPTOMS: Do you have any other symptoms? (e.g., chest pain, back pain, breathing difficulty, swelling, rash, fever, numbness, weakness)     Generalized chronic fatigue  Protocols used: Leg Pain-A-AH Copied from CRM #[id number]. Topic: Clinical - Red Word Triage >> Apr 11, 2024  3:55 PM Robinson H wrote: [facility] that prompted transfer to Nurse Triage:  Terrible leg cramps and pain and can't walk prominent in right leg knee down to bottom of foot, fatigue and overall weakness

## 2024-04-12 NOTE — Telephone Encounter (Signed)
**Note De-identified  [place] Obfuscation** Please advise 

## 2024-04-13 ENCOUNTER — Encounter: Payer: Self-pay | Admitting: Internal Medicine

## 2024-04-13 ENCOUNTER — Ambulatory Visit (INDEPENDENT_AMBULATORY_CARE_PROVIDER_SITE_OTHER): Admitting: Internal Medicine

## 2024-04-13 VITALS — BP 116/80 | HR 61 | Temp 98.1°F | Ht 64.0 in | Wt 95.0 lb

## 2024-04-13 DIAGNOSIS — R251 Tremor, unspecified: Secondary | ICD-10-CM

## 2024-04-13 DIAGNOSIS — J4489 Other specified chronic obstructive pulmonary disease: Secondary | ICD-10-CM

## 2024-04-13 DIAGNOSIS — F5104 Psychophysiologic insomnia: Secondary | ICD-10-CM

## 2024-04-13 DIAGNOSIS — K589 Irritable bowel syndrome without diarrhea: Secondary | ICD-10-CM

## 2024-04-13 DIAGNOSIS — R7303 Prediabetes: Secondary | ICD-10-CM

## 2024-04-13 DIAGNOSIS — Z681 Body mass index (BMI) 19 or less, adult: Secondary | ICD-10-CM | POA: Diagnosis not present

## 2024-04-13 DIAGNOSIS — R636 Underweight: Secondary | ICD-10-CM

## 2024-04-13 DIAGNOSIS — F411 Generalized anxiety disorder: Secondary | ICD-10-CM

## 2024-04-13 NOTE — Assessment & Plan Note (Signed)
 We discussed high calorie foods and she is using boost daily to help with caloric intake. She is limited by IBS in her intake patterns.

## 2024-04-13 NOTE — Assessment & Plan Note (Signed)
 She is able to sleep and wakes to urinate. She is able to get back to sleep easily. If needed for anxiety would pick remeron.

## 2024-04-13 NOTE — Assessment & Plan Note (Signed)
 We discussed how this likely causes increased metabolic need and may be keeping her weight lower than expected.

## 2024-04-13 NOTE — Assessment & Plan Note (Signed)
 She did take cipro  recently and is unsure if that helped. She is limiting diet and does not know if what she is doing is helping. Asked to consider food diary to help track symptoms to foods.

## 2024-04-13 NOTE — Patient Instructions (Addendum)
 Try magnesium daily to see if this helps with the cramps.  We will get you in with the nutritionist for the diet information.  We will get you in with a counselor.

## 2024-04-13 NOTE — Assessment & Plan Note (Signed)
 Referral to counseling. If needed medication would start remeron to help with sleep/anxiety/and appetite. She is using buspar  prn as needed and mentioned having some old valium  which she uses rarely.

## 2024-04-13 NOTE — Assessment & Plan Note (Signed)
 We discussed again this diagnosis. She does not want to be on medicine for this currently. First line option would be low dose beta blocker.

## 2024-04-13 NOTE — Progress Notes (Signed)
 Subjective:   Patient ID: Shannon Parsons, female    DOB: 1940-12-21, 83 y.o.   MRN: [id number]  Discussed the use of AI scribe software for clinical note transcription with the patient, who gave verbal consent to proceed.  History of Present Illness Shannon Parsons is an 83 year old female with asthma and IBS who presents with leg cramps and unable to gain weight.  She experiences severe leg cramps primarily affecting her right leg, occurring two to three times a month. The cramps are intense, lasting about 10 to 15 minutes, and can disrupt her day. The pain starts from the bottom of her foot and moves up to her toes, causing her foot to 'claw up'.  She experiences frequent nocturia, waking up every night to urinate at least once, sometimes up to three times. Despite this, she is able to return to sleep easily most of the time. She reports heavy dreaming but does not remember the dreams.  She has a history of IBS and ongoing stomach issues, which she believes contribute to her inability to gain weight. Despite efforts, she has been unable to gain weight and notes her BMI is low. Sourdough bread seems to help her stomach issues, and she uses Boost nutritional drinks to supplement her diet. No diarrhea or constipation reported.  She has a history of anxiety, which she feels is exacerbated by her current health issues. She feels extremely tired and weak, struggling to lift a pan while cooking. 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   She experiences an essential tremor, particularly in her neck, which worsens with anxiety. This tremor makes it difficult for her to read or hold her head steady.  Review of Systems  Constitutional:  Positive for activity change and fatigue.  HENT: Negative.    Eyes: Negative.   Respiratory:  Negative for cough, chest tightness and shortness of breath.   Cardiovascular:  Negative for chest pain, palpitations and leg swelling.  Gastrointestinal:   Positive for abdominal pain and diarrhea. Negative for abdominal distention, constipation, nausea and vomiting.  Musculoskeletal:  Positive for arthralgias.  Skin: Negative.   Neurological:  Positive for tremors.  Psychiatric/Behavioral:  Positive for sleep disturbance. The patient is nervous/anxious.     Objective:  Physical Exam Constitutional:      Appearance: She is well-developed.     Comments: thin  HENT:     Head: Normocephalic and atraumatic.  Cardiovascular:     Rate and Rhythm: Normal rate and regular rhythm.  Pulmonary:     Effort: Pulmonary effort is normal. No respiratory distress.     Breath sounds: Normal breath sounds. No wheezing or rales.  Abdominal:     General: Bowel sounds are normal. There is no distension.     Palpations: Abdomen is soft.     Tenderness: There is no abdominal tenderness. There is no rebound.  Musculoskeletal:     Cervical back: Normal range of motion.  Skin:    General: Skin is warm and dry.  Neurological:     Mental Status: She is alert and oriented to person, place, and time.     Coordination: Coordination normal.     Vitals:   04/13/24 0811  BP: 116/80  Pulse: 61  Temp: 98.1 F (36.7 C)  TempSrc: Oral  SpO2: 99%  Weight: 95 lb (43.1 kg)  Height: 5' 4 (1.626 m)    Assessment and Plan Assessment & Plan Muscle cramps in lower extremities   Intermittent  cramps, severe in the right leg, may relate to hydration or magnesium levels. Change magnesium supplementation to daily from prn and ensure adequate hydration.  Nocturia and insomnia   She experiences nocturia with nightly awakenings and insomnia characterized by difficulty falling asleep and frequent awakenings, possibly worsened by anxiety. No change to medication for now.   Irritable bowel syndrome/pre-diabetes Chronic IBS presents with gastrointestinal symptoms and difficulty gaining weight, likely linked to IBS and dietary restrictions. Refer for nutrition  counseling.  Essential tremor   Essential tremor affects the neck and is worsened by anxiety. Consider medication if the tremor becomes more bothersome.  Anxiety disorder   Parsons anxiety levels contribute to insomnia and may worsen the essential tremor. Stress affects daily activities and rest. Discussed potential benefits of counseling and medication. Refer to a counselor for anxiety management and consider medication to manage anxiety and improve appetite.

## 2024-04-13 NOTE — Assessment & Plan Note (Signed)
 Referral to nutrition and discussed with her that this is not really the reason she wants to see nutrition but low weight is not covered by her insurance for a nutritionist.

## 2024-04-22 ENCOUNTER — Encounter (HOSPITAL_COMMUNITY): Payer: Self-pay

## 2024-04-22 ENCOUNTER — Emergency Department (HOSPITAL_COMMUNITY)

## 2024-04-22 ENCOUNTER — Observation Stay (HOSPITAL_COMMUNITY)
Admission: EM | Admit: 2024-04-22 | Discharge: 2024-04-23 | Disposition: A | Discharge status: Home / Self Care | Attending: Internal Medicine | Admitting: Internal Medicine

## 2024-04-22 ENCOUNTER — Other Ambulatory Visit: Payer: Self-pay

## 2024-04-22 ENCOUNTER — Ambulatory Visit
Admission: EM | Admit: 2024-04-22 | Discharge: 2024-04-22 | Disposition: A | Discharge status: Home / Self Care | Attending: Internal Medicine | Admitting: Internal Medicine

## 2024-04-22 DIAGNOSIS — R197 Diarrhea, unspecified: Secondary | ICD-10-CM | POA: Diagnosis not present

## 2024-04-22 DIAGNOSIS — K7689 Other specified diseases of liver: Secondary | ICD-10-CM | POA: Diagnosis not present

## 2024-04-22 DIAGNOSIS — E782 Mixed hyperlipidemia: Secondary | ICD-10-CM | POA: Insufficient documentation

## 2024-04-22 DIAGNOSIS — Z8249 Family history of ischemic heart disease and other diseases of the circulatory system: Secondary | ICD-10-CM | POA: Insufficient documentation

## 2024-04-22 DIAGNOSIS — R079 Chest pain, unspecified: Secondary | ICD-10-CM | POA: Diagnosis not present

## 2024-04-22 DIAGNOSIS — I491 Atrial premature depolarization: Secondary | ICD-10-CM | POA: Diagnosis not present

## 2024-04-22 DIAGNOSIS — K580 Irritable bowel syndrome with diarrhea: Secondary | ICD-10-CM | POA: Insufficient documentation

## 2024-04-22 DIAGNOSIS — N179 Acute kidney failure, unspecified: Secondary | ICD-10-CM | POA: Diagnosis not present

## 2024-04-22 DIAGNOSIS — I351 Nonrheumatic aortic (valve) insufficiency: Secondary | ICD-10-CM | POA: Insufficient documentation

## 2024-04-22 DIAGNOSIS — J45909 Unspecified asthma, uncomplicated: Secondary | ICD-10-CM | POA: Insufficient documentation

## 2024-04-22 DIAGNOSIS — R0789 Other chest pain: Secondary | ICD-10-CM | POA: Diagnosis not present

## 2024-04-22 DIAGNOSIS — E43 Unspecified severe protein-calorie malnutrition: Secondary | ICD-10-CM | POA: Insufficient documentation

## 2024-04-22 DIAGNOSIS — I10 Essential (primary) hypertension: Secondary | ICD-10-CM | POA: Diagnosis not present

## 2024-04-22 DIAGNOSIS — K4020 Bilateral inguinal hernia, without obstruction or gangrene, not specified as recurrent: Secondary | ICD-10-CM | POA: Diagnosis not present

## 2024-04-22 DIAGNOSIS — R195 Other fecal abnormalities: Secondary | ICD-10-CM

## 2024-04-22 DIAGNOSIS — R7989 Other specified abnormal findings of blood chemistry: Secondary | ICD-10-CM | POA: Diagnosis not present

## 2024-04-22 DIAGNOSIS — I700 Atherosclerosis of aorta: Secondary | ICD-10-CM | POA: Diagnosis not present

## 2024-04-22 DIAGNOSIS — R072 Precordial pain: Secondary | ICD-10-CM | POA: Diagnosis not present

## 2024-04-22 DIAGNOSIS — Z681 Body mass index (BMI) 19 or less, adult: Secondary | ICD-10-CM | POA: Diagnosis not present

## 2024-04-22 DIAGNOSIS — I499 Cardiac arrhythmia, unspecified: Secondary | ICD-10-CM | POA: Diagnosis not present

## 2024-04-22 DIAGNOSIS — R1310 Dysphagia, unspecified: Secondary | ICD-10-CM | POA: Diagnosis not present

## 2024-04-22 DIAGNOSIS — M542 Cervicalgia: Secondary | ICD-10-CM | POA: Diagnosis not present

## 2024-04-22 DIAGNOSIS — E039 Hypothyroidism, unspecified: Secondary | ICD-10-CM | POA: Insufficient documentation

## 2024-04-22 DIAGNOSIS — I2510 Atherosclerotic heart disease of native coronary artery without angina pectoris: Secondary | ICD-10-CM | POA: Diagnosis not present

## 2024-04-22 DIAGNOSIS — R110 Nausea: Secondary | ICD-10-CM | POA: Diagnosis not present

## 2024-04-22 DIAGNOSIS — R636 Underweight: Secondary | ICD-10-CM | POA: Insufficient documentation

## 2024-04-22 LAB — CBC
HCT: 38.6 % (ref 36.0–46.0)
Hemoglobin: 12.8 g/dL (ref 12.0–15.0)
MCH: 29.6 pg (ref 26.0–34.0)
MCHC: 33.2 g/dL (ref 30.0–36.0)
MCV: 89.1 fL (ref 80.0–100.0)
Platelets: 159 K/uL (ref 150–400)
RBC: 4.33 MIL/uL (ref 3.87–5.11)
RDW: 14.4 % (ref 11.5–15.5)
WBC: 15.6 K/uL — ABNORMAL HIGH (ref 4.0–10.5)
nRBC: 0 % (ref 0.0–0.2)

## 2024-04-22 LAB — BASIC METABOLIC PANEL WITH GFR
Anion gap: 11 (ref 5–15)
BUN: 24 mg/dL — ABNORMAL HIGH (ref 8–23)
CO2: 26 mmol/L (ref 22–32)
Calcium: 9.1 mg/dL (ref 8.9–10.3)
Chloride: 102 mmol/L (ref 98–111)
Creatinine, Ser: 1.21 mg/dL — ABNORMAL HIGH (ref 0.44–1.00)
GFR, Estimated: 45 mL/min — ABNORMAL LOW (ref >60)
Glucose, Bld: 121 mg/dL — ABNORMAL HIGH (ref 70–99)
Potassium: 4.3 mmol/L (ref 3.5–5.1)
Sodium: 139 mmol/L (ref 135–145)

## 2024-04-22 LAB — RESP PANEL BY RT-PCR (RSV, FLU A&B, COVID)  RVPGX2
Influenza A by PCR: NEGATIVE
Influenza B by PCR: NEGATIVE
Resp Syncytial Virus by PCR: NEGATIVE
SARS Coronavirus 2 by RT PCR: NEGATIVE

## 2024-04-22 LAB — LIPASE, BLOOD: Lipase: 52 U/L — ABNORMAL HIGH (ref 11–51)

## 2024-04-22 LAB — HEPATIC FUNCTION PANEL
ALT: 17 U/L (ref 0–44)
AST: 33 U/L (ref 15–41)
Albumin: 3.4 g/dL — ABNORMAL LOW (ref 3.5–5.0)
Alkaline Phosphatase: 42 U/L (ref 38–126)
Bilirubin, Direct: 0.3 mg/dL — ABNORMAL HIGH (ref 0.0–0.2)
Indirect Bilirubin: 1 mg/dL — ABNORMAL HIGH (ref 0.3–0.9)
Total Bilirubin: 1.3 mg/dL — ABNORMAL HIGH (ref 0.0–1.2)
Total Protein: 5.4 g/dL — ABNORMAL LOW (ref 6.5–8.1)

## 2024-04-22 LAB — TROPONIN I (HIGH SENSITIVITY)
Troponin I (High Sensitivity): 23 ng/L — ABNORMAL HIGH (ref <18)
Troponin I (High Sensitivity): 20 ng/L — ABNORMAL HIGH (ref <18)

## 2024-04-22 MED ORDER — ADULT MULTIVITAMIN W/MINERALS CH
1.0000 | ORAL_TABLET | Freq: Every day | ORAL | Status: DC
  Filled 2024-04-22 (×2): qty 1

## 2024-04-22 MED ORDER — MORPHINE SULFATE (PF) 4 MG/ML IV SOLN
4.0000 mg | Freq: Once | INTRAVENOUS | Status: AC
  Administered 2024-04-22: 4 mg via INTRAVENOUS
  Filled 2024-04-22: qty 1

## 2024-04-22 MED ORDER — SODIUM CHLORIDE 0.9 % IV SOLN
INTRAVENOUS | Status: DC
  Administered 2024-04-22: 40 mL via INTRAVENOUS

## 2024-04-22 MED ORDER — LEVOTHYROXINE SODIUM 50 MCG PO TABS
50.0000 ug | ORAL_TABLET | Freq: Every day | ORAL | Status: DC
  Administered 2024-04-23 (×2): 50 ug via ORAL
  Filled 2024-04-22: qty 1

## 2024-04-22 MED ORDER — VITAMIN C 500 MG PO TABS
500.0000 mg | ORAL_TABLET | ORAL | Status: DC
  Filled 2024-04-22: qty 1

## 2024-04-22 MED ORDER — NITROGLYCERIN 0.4 MG SL SUBL
0.4000 mg | SUBLINGUAL_TABLET | SUBLINGUAL | Status: DC | PRN
  Filled 2024-04-22: qty 1

## 2024-04-22 MED ORDER — BISACODYL 5 MG PO TBEC: 5.0000 mg | DELAYED_RELEASE_TABLET | Freq: Every day | ORAL | Status: DC | PRN

## 2024-04-22 MED ORDER — ROSUVASTATIN CALCIUM 5 MG PO TABS
5.0000 mg | ORAL_TABLET | Freq: Every day | ORAL | Status: DC
  Administered 2024-04-23 (×2): 5 mg via ORAL
  Filled 2024-04-22: qty 1

## 2024-04-22 MED ORDER — VITAMIN D 25 MCG (1000 UNIT) PO TABS
4000.0000 [IU] | ORAL_TABLET | Freq: Every day | ORAL | Status: DC
  Filled 2024-04-22 (×2): qty 4

## 2024-04-22 MED ORDER — DOCUSATE SODIUM 100 MG PO CAPS
100.0000 mg | ORAL_CAPSULE | Freq: Two times a day (BID) | ORAL | Status: DC
  Filled 2024-04-22 (×3): qty 1

## 2024-04-22 MED ORDER — SODIUM CHLORIDE 0.9 % IV BOLUS: 1000.0000 mL | Freq: Once | INTRAVENOUS | Status: DC

## 2024-04-22 MED ORDER — ACETAMINOPHEN 325 MG PO TABS: 650.0000 mg | ORAL_TABLET | Freq: Four times a day (QID) | ORAL | Status: DC | PRN

## 2024-04-22 MED ORDER — BUDESON-GLYCOPYRROL-FORMOTEROL 160-9-4.8 MCG/ACT IN AERO
2.0000 | INHALATION_SPRAY | Freq: Two times a day (BID) | RESPIRATORY_TRACT | Status: DC
  Administered 2024-04-22 – 2024-04-23 (×3): 2 via RESPIRATORY_TRACT
  Filled 2024-04-22: qty 5.9

## 2024-04-22 MED ORDER — ONDANSETRON HCL 4 MG PO TABS: 4.0000 mg | ORAL_TABLET | Freq: Three times a day (TID) | ORAL | Status: DC | PRN

## 2024-04-22 MED ORDER — PANTOPRAZOLE SODIUM 40 MG PO TBEC
40.0000 mg | DELAYED_RELEASE_TABLET | Freq: Every day | ORAL | Status: DC
  Administered 2024-04-22: 40 mg via ORAL
  Filled 2024-04-22 (×2): qty 1

## 2024-04-22 MED ORDER — ENOXAPARIN SODIUM 30 MG/0.3ML IJ SOSY
30.0000 mg | PREFILLED_SYRINGE | INTRAMUSCULAR | Status: DC
  Administered 2024-04-22: 30 mg via SUBCUTANEOUS
  Filled 2024-04-22: qty 0.3

## 2024-04-22 MED ORDER — ALUM & MAG HYDROXIDE-SIMETH 200-200-20 MG/5ML PO SUSP: 15.0000 mL | Freq: Four times a day (QID) | ORAL | Status: DC | PRN

## 2024-04-22 MED ORDER — ACETAMINOPHEN 650 MG RE SUPP
650.0000 mg | Freq: Four times a day (QID) | RECTAL | Status: DC | PRN
Start: 2024-04-22 — End: 2024-04-23

## 2024-04-22 MED ORDER — HYDRALAZINE HCL 20 MG/ML IJ SOLN: 10.0000 mg | Freq: Four times a day (QID) | INTRAMUSCULAR | Status: DC | PRN

## 2024-04-22 MED ORDER — IOHEXOL 350 MG/ML SOLN
100.0000 mL | Freq: Once | INTRAVENOUS | Status: AC | PRN
  Administered 2024-04-22: 100 mL via INTRAVENOUS

## 2024-04-22 MED ORDER — HYDROMORPHONE HCL 1 MG/ML IJ SOLN
0.2000 mg | Freq: Once | INTRAMUSCULAR | Status: AC
  Administered 2024-04-22: 0.2 mg via INTRAVENOUS
  Filled 2024-04-22: qty 1

## 2024-04-22 MED ORDER — ALBUTEROL SULFATE (2.5 MG/3ML) 0.083% IN NEBU: 2.5000 mg | INHALATION_SOLUTION | Freq: Four times a day (QID) | RESPIRATORY_TRACT | Status: DC | PRN

## 2024-04-22 MED ORDER — ACETAMINOPHEN 325 MG PO TABS: 650.0000 mg | ORAL_TABLET | Freq: Every evening | ORAL | Status: DC | PRN

## 2024-04-22 NOTE — ED Provider Notes (Signed)
 [facility]    CSN: [id number] Arrival date & time: 04/22/24  9187      History   Chief Complaint Chief Complaint  Patient presents with   Chest Pain    HPI Shannon Parsons is a 83 y.o. female.   83 year old female who presents urgent care with complaints of severe chest pain radiating into her back, severe neck pain and difficulty swallowing.  She reports most of her symptoms started this morning and woke her up at 5am.  She reports she has never had this much pain.  The pain is in the center of the chest and epigastric region.  The pain radiates into the back and is sharp and shooting at times.  She denies any shortness of breath, fevers, chills, vomiting.  She reports that she is unable to even get comfortable.  The pain has not eased up.  She does have a history of having chest pain in the past. Her neck started to hurt yesterday after working in the yard. The pain does not seem to be positional. She has had diarrhea (last night) and nausea today.    Chest Pain Associated symptoms: nausea (today)   Associated symptoms: no abdominal pain, no back pain, no cough, no fever, no palpitations, no shortness of breath and no vomiting     Past Medical History:  Diagnosis Date   Adenomatous colon polyp 1994   Asthma    Cataract 2013   bilateral eyes   Diverticulosis    Family history of ischemic heart disease    Fibrocystic breast disease    GERD (gastroesophageal reflux disease)    Hiatal hernia    Hyperlipidemia    IBS (irritable bowel syndrome)    Internal hemorrhoids    Mantle cell lymphoma (HCC) 10/18/2014   Melanoma (HCC)    Facial, pt denies on 01/16/18   OSA (obstructive sleep apnea)    Osteoarthritis    Osteoporosis    Prediabetes    Unspecified hypothyroidism    Vitamin D  deficiency     Patient Active Problem List   Diagnosis Date Noted   Underweight (BMI < 18.5) 04/13/2024   B12 deficiency 12/06/2023   Psychophysiological insomnia 10/06/2021    Bronchiectasis (HCC) 03/20/2020   OSA (obstructive sleep apnea) 03/20/2020   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 08/09/2017   Tremor 05/06/2016   Acquired hypogammaglobulinemia (HCC) 04/05/2016   Routine general medical examination at a health care facility 04/02/2015   History of DVT (deep vein thrombosis)    Normocytic anemia 01/04/2015   Mantle cell lymphoma (HCC) 10/18/2014   Anxiety state 02/27/2014   Hyperlipidemia, mixed    GERD    Osteoporosis  (Refuses treatment)    Osteoarthritis    Vitamin D  deficiency    IBS    Chronic obstructive airway disease with asthma (HCC) 03/16/2011    Past Surgical History:  Procedure Laterality Date   APPENDECTOMY     BUNIONECTOMY     CATARACT EXTRACTION, BILATERAL     IR REMOVAL TUN ACCESS W/ PORT W/O FL MOD SED  03/04/2020   KNEE ARTHROSCOPY     TONSILLECTOMY     UMBILICAL HERNIA REPAIR     VIDEO BRONCHOSCOPY Bilateral 10/12/2017   Procedure: VIDEO BRONCHOSCOPY WITHOUT FLUORO;  Surgeon: Darlean Ozell NOVAK, MD;  Location: [facility];  Service: Endoscopy;  Laterality: Bilateral;    OB History   No obstetric history on file.      Home Medications    Prior to Admission medications   Medication Sig Start Date End Date Taking? Authorizing Provider  acetaminophen  (TYLENOL ) 325 MG tablet Take 650 mg by mouth at bedtime as needed for mild pain.    [provider]  albuterol  (VENTOLIN  HFA) 108 (90 Base) MCG/ACT inhaler Inhale 1-2 puffs into the lungs every 6 (six) hours as needed for wheezing or shortness of breath. 10/30/23   Patt Alm Macho, MD  Budeson-Glycopyrrol-Formoterol  (BREZTRI  AEROSPHERE) 160-9-4.8 MCG/ACT AERO Inhale 2 puffs into the lungs 2 (two) times daily. 11/15/23   Rollene Almarie LABOR, MD  busPIRone  (BUSPAR ) 5 MG tablet Take 1  tablet (5 mg total) by mouth 2 (two) times daily as needed. 12/06/23   Rollene Almarie LABOR, MD  Cholecalciferol  (VITAMIN D3) 50 MCG (2000 UT) capsule Take 4,000 Units by mouth daily.     [provider]  ciprofloxacin  (CIPRO ) 500 MG tablet Take 1 tablet (500 mg total) by mouth 2 (two) times daily. 03/08/24   Stacia Glendia BRAVO, MD  hyoscyamine  (LEVSIN  SL) 0.125 MG SL tablet Take    1 to 2 tablets     every 4 hours       as needed for Nausea, Cramping, Bloating or Diarrhea DIARRHEA 03/08/24   Stacia Glendia BRAVO, MD  levothyroxine  (SYNTHROID ) 50 MCG tablet TAKE 1 TABLET DAILY ON AN EMPTY STOMACH WITH ONLY WATER FOR 30 MINUTES. NO ANTACID MEDS, CALCIUM  OR MAGNESIUM FOR 4 HOURS AND AVOID BIOTIN 04/22/23   Wilkinson, Dana E, NP  Magnesium 250 MG TABS Take 1 tablet by mouth daily as needed.    [provider]  ondansetron  (ZOFRAN ) 8 MG tablet Take 1/2 to 1 tablet 3 x /day (every 6 hours) if needed for Nausea 08/03/23   Tonita Fallow, MD  rosuvastatin  (CRESTOR ) 5 MG tablet TAKE 1/4 TABLET BY MOUTH DAILY FOR CHOLESTEROL 02/20/24   Rollene Almarie LABOR, MD  Spacer/Aero-Holding Chambers (EASIVENT MASK SMALL) MISC See admin instructions. 03/18/16   [provider]  vitamin C  (ASCORBIC ACID ) 500 MG tablet Take 500 mg by mouth every other day.    [provider]    Family History Family History  Problem Relation Age of Onset   Hypertension Mother    Thyroid  disease Mother    Emphysema Father    Heart disease Father    Heart attack Father 10       Died   Rheum arthritis Sister    Heart disease Brother 62   Colon cancer Neg Hx    Stomach cancer Neg Hx    Sleep apnea Neg Hx     Social History Social History   Tobacco Use   Smoking status: Never   Smokeless tobacco: Never   Tobacco comments:    Never Used Tobacco  Vaping Use   Vaping status: Never Used  Substance Use Topics   Alcohol use: No    Alcohol/week: 0.0 standard drinks of alcohol   Drug use: No      Allergies   Alendronate, Risedronate sodium, Sulfa antibiotics, Sulfasalazine, Other, and Tetracyclines & related   Review of Systems Review of Systems  Constitutional:  Negative for chills and fever.  HENT:  Negative for ear pain and sore throat.   Eyes:  Negative for pain and visual disturbance.  Respiratory:  Positive for chest tightness. Negative for cough and shortness of breath.  Cardiovascular:  Positive for chest pain. Negative for palpitations.  Gastrointestinal:  Positive for diarrhea (last night) and nausea (today). Negative for abdominal pain and vomiting.  Genitourinary:  Negative for dysuria and hematuria.  Musculoskeletal:  Positive for neck pain and neck stiffness. Negative for arthralgias and back pain.  Skin:  Negative for color change and rash.  Neurological:  Negative for seizures and syncope.  All other systems reviewed and are negative.    Physical Exam Triage Vital Signs ED Triage Vitals [04/22/24 0840]  Encounter Vitals Group     BP (!) 126/106     Girls Systolic BP Percentile      Girls Diastolic BP Percentile      Boys Systolic BP Percentile      Boys Diastolic BP Percentile      Pulse Rate 92     Resp 20     Temp 99.1 F (37.3 C)     Temp Source Oral     SpO2 98 %     Weight      Height      Head Circumference      Peak Flow      Pain Score      Pain Loc      Pain Education      Exclude from Growth Chart    No data found.  Updated Vital Signs BP (!) 126/106 (BP Location: Right Arm)   Pulse 92   Temp 99.1 F (37.3 C) (Oral)   Resp 20   LMP 09/14/2014   SpO2 98%   Visual Acuity Right Eye Distance:   Left Eye Distance:   Bilateral Distance:    Right Eye Near:   Left Eye Near:    Bilateral Near:     Physical Exam Vitals and nursing note reviewed.  Constitutional:      General: She is in acute distress.     Appearance: She is well-developed.     Comments: Pt appears in severe pain  HENT:     Head: Normocephalic and  atraumatic.  Eyes:     Conjunctiva/sclera: Conjunctivae normal.  Cardiovascular:     Rate and Rhythm: Normal rate and regular rhythm.     Pulses:          Carotid pulses are 2+ on the right side and 2+ on the left side.      Radial pulses are 2+ on the right side and 2+ on the left side.     Heart sounds: No murmur heard. Pulmonary:     Effort: Pulmonary effort is normal. No respiratory distress.     Breath sounds: Decreased breath sounds (mildly decreased) present. No wheezing or rhonchi.  Abdominal:     Palpations: Abdomen is soft.     Tenderness: There is no abdominal tenderness.  Musculoskeletal:        General: No swelling.     Cervical back: Neck supple.  Skin:    General: Skin is warm and dry.     Capillary Refill: Capillary refill takes less than 2 seconds.  Neurological:     Mental Status: She is alert and oriented to person, place, and time.  Psychiatric:        Mood and Affect: Mood normal.      UC Treatments / Results  Labs (all labs ordered are listed, but only abnormal results are displayed) Labs Reviewed - No data to display  EKG   Radiology No results found.  Procedures Procedures (including critical care time)  Medications Ordered in UC Medications - No data to display  Initial Impression / Assessment and Plan / UC Course  I have reviewed the triage vital signs and the nursing notes.  Pertinent labs & imaging results that were available during my care of the patient were reviewed by me and considered in my medical decision making (see chart for details).     Precordial chest pain  Neck pain   83 year old female who presented with severe chest pain radiating into the back as well as the neck.  She had associated nausea as well as diarrhea last night.  EKG done here was nonconclusive as the patient is tremoring which is a baseline for her.  Given the level of pain on presentation as well as the patient's history, we have recommended evaluation  at the emergency room.  Given the level of pain she is in we have also recommended transport by EMS.  Patient's family has accompanied her today. IV access was obtained and the patient was monitored while in UC until EMS arrived.   Final Clinical Impressions(s) / UC Diagnoses   Final diagnoses:  Precordial chest pain  Neck pain   Discharge Instructions   None    ED Prescriptions   None    PDMP not reviewed this encounter.   Teresa Almarie LABOR, [place] 04/22/24 [phone]

## 2024-04-22 NOTE — ED Notes (Signed)
 Nt called CCMD@10 :20am

## 2024-04-22 NOTE — ED Triage Notes (Signed)
 Per EMS, Pt, from UC, c/o intermittent central chest pain radiating into back and neck starting at 0500 and diarrhea starting last night.  Pain score 9/10.  Pt reports doing yard work and moving heavy items x2 days ago.   Pt given 324mg  aspirin  and Nitroglycerin  x1 en route.

## 2024-04-22 NOTE — Evaluation (Signed)
 Clinical/Bedside Swallow Evaluation Patient Details  Name: Shannon Parsons MRN: [id number] Date of Birth: 05/27/1941  Today's Date: 04/22/2024 Time: SLP Start Time (ACUTE ONLY): 1619 SLP Stop Time (ACUTE ONLY): 1640 SLP Time Calculation (min) (ACUTE ONLY): 21 min  Past Medical History:  Past Medical History:  Diagnosis Date   Adenomatous colon polyp 1994   Asthma    Cataract 2013   bilateral eyes   Diverticulosis    Family history of ischemic heart disease    Fibrocystic breast disease    GERD (gastroesophageal reflux disease)    Hiatal hernia    Hyperlipidemia    IBS (irritable bowel syndrome)    Internal hemorrhoids    Mantle cell lymphoma (HCC) 10/18/2014   Melanoma (HCC)    Facial, pt denies on 01/16/18   OSA (obstructive sleep apnea)    Osteoarthritis    Osteoporosis    Prediabetes    Unspecified hypothyroidism    Vitamin D  deficiency    Past Surgical History:  Past Surgical History:  Procedure Laterality Date   APPENDECTOMY     BUNIONECTOMY     CATARACT EXTRACTION, BILATERAL     IR REMOVAL TUN ACCESS W/ PORT W/O FL MOD SED  03/04/2020   KNEE ARTHROSCOPY     TONSILLECTOMY     UMBILICAL HERNIA REPAIR     VIDEO BRONCHOSCOPY Bilateral 10/12/2017   Procedure: VIDEO BRONCHOSCOPY WITHOUT FLUORO;  Surgeon: Darlean Ozell NOVAK, MD;  Location: [facility];  Service: Endoscopy;  Laterality: Bilateral;   HPI:  Patient is an 83 y.o. female with PMH: hyperlipidemia, hypertension, OSA, IBS, lymphoma, asthma, GERD, esophageal stricture at GE junction. She presented to the hospital on 04/22/2024 via EMS from urgent care center for evaluation of chest pain. She endorsed severe upper chest pain that radiates to her back into her neck with trouble swallowing.    Assessment / Plan / Recommendation  Clinical Impression  Patient is not presenting with clinical s/s of dysphagia as per this bedside swallow evaluation. Based on her presentation, complaints and her history, suspect she has a  primary esophageal dysphagia. She has prior diagnosis (EGD 2015) of esophageal stricture at GE junction but patient indicated she was not aware of this. She endorses a globus sensation approximately level of sternum as well as bilateral radiating neck pain when she swallows. SLP discussed recommendation to f/u with GI if her symptoms persist or get worse and she was in agreement with this. SLP to s/o at this time. SLP Visit Diagnosis: Dysphagia, unspecified (R13.10)    Aspiration Risk  No limitations    Diet Recommendation Regular;Thin liquid    Liquid Administration via: Cup;Straw Medication Administration: Whole meds with liquid Postural Changes: Seated upright at 90 degrees;Remain upright for at least 30 minutes after po intake    Other  Recommendations Oral Care Recommendations: Oral care BID     Assistance Recommended at Discharge    Functional Status Assessment Patient has not had a recent decline in their functional status  Frequency and Duration     N/A       Prognosis   N/A     Swallow Study   General Date of Onset: 04/22/24 HPI: Patient is an 83 y.o. female with PMH: hyperlipidemia, hypertension, OSA, IBS, lymphoma, asthma, GERD, esophageal stricture at GE junction. She presented to the hospital on 04/22/2024 via EMS from urgent care center for evaluation of chest pain. She endorsed severe upper chest pain that radiates to her back into her neck with trouble  swallowing. Type of Study: Bedside Swallow Evaluation Previous Swallow Assessment: none found Diet Prior to this Study: Regular;Thin liquids (Level 0) Temperature Spikes Noted: No Respiratory Status: Room air History of Recent Intubation: No Behavior/Cognition: Alert;Pleasant mood;Cooperative Oral Cavity Assessment: Within Functional Limits Oral Care Completed by SLP: No Oral Cavity - Dentition: Adequate natural dentition Vision: Functional for self-feeding Self-Feeding Abilities: Able to feed self Patient  Positioning: Upright in bed Baseline Vocal Quality: Normal Volitional Cough: Strong Volitional Swallow: Able to elicit    Oral/Motor/Sensory Function Overall Oral Motor/Sensory Function: Within functional limits   Ice Chips     Thin Liquid Thin Liquid: Within functional limits Presentation: Self Fed;Straw    Nectar Thick     Honey Thick     Puree Puree: Within functional limits Presentation: Spoon;Self Fed   Solid            Norleen IVAR Blase, MA, CCC-SLP Speech Therapy

## 2024-04-22 NOTE — ED Notes (Signed)
 Patient is being discharged from the Urgent Care and sent to the Emergency Department via EMS . Per E. White, PA-C, patient is in need of higher level of care due to chest pain. Patient is aware and verbalizes understanding of plan of care.  Vitals:   04/22/24 0840  BP: (!) 126/106  Pulse: 92  Resp: 20  Temp: 99.1 F (37.3 C)  SpO2: 98%

## 2024-04-22 NOTE — H&P (Signed)
 History and Physical    Patient: Shannon Parsons FMW:[id number] DOB: 06/13/1941 DOA: 04/22/2024 DOS: the patient was seen and examined on 04/22/2024 PCP: Rollene Almarie LABOR, MD  Patient coming from: Home  Chief Complaint:  Chief Complaint  Patient presents with   Chest Pain   Diarrhea   HPI: LUCIANNA Parsons is a 83 y.o. female with medical history significant of asthma, anxiety, irritable bowel syndrome, essential tremors presented to the emergency department for evaluation of chest pain.  Patient states the pain started yesterday, associated with radiation to the neck, back.  No shortness of breath or palpitations.  No diaphoresis.  Patient did report pain in the neck while swallowing.  2 days ago she reported lifting heavy things.  Reported that this morning her pain worsened presented to ED.  She stated pain better with sublingual nitro. Patient does also have indigestion, gas discomfort, diarrhea with 4 loose stools yesterday.  Denies any fever, chills, rigors.  No abdominal pain, nausea or vomiting.  In the emergency department, she is hemodynamically stable, troponin mildly elevated at 23, lipase 52, creatinine 1.21, white count 15.6.  COVID, flu RSV negative.  EKG no ST-T wave changes.  ED provider requested hospitalist admission for further management evaluation of chest pain.   Review of Systems: As mentioned in the history of present illness. All other systems reviewed and are negative. Past Medical History:  Diagnosis Date   Adenomatous colon polyp 1994   Asthma    Cataract 2013   bilateral eyes   Diverticulosis    Family history of ischemic heart disease    Fibrocystic breast disease    GERD (gastroesophageal reflux disease)    Hiatal hernia    Hyperlipidemia    IBS (irritable bowel syndrome)    Internal hemorrhoids    Mantle cell lymphoma (HCC) 10/18/2014   Melanoma (HCC)    Facial, pt denies on 01/16/18   OSA (obstructive sleep apnea)    Osteoarthritis     Osteoporosis    Prediabetes    Unspecified hypothyroidism    Vitamin D  deficiency    Past Surgical History:  Procedure Laterality Date   APPENDECTOMY     BUNIONECTOMY     CATARACT EXTRACTION, BILATERAL     IR REMOVAL TUN ACCESS W/ PORT W/O FL MOD SED  03/04/2020   KNEE ARTHROSCOPY     TONSILLECTOMY     UMBILICAL HERNIA REPAIR     VIDEO BRONCHOSCOPY Bilateral 10/12/2017   Procedure: VIDEO BRONCHOSCOPY WITHOUT FLUORO;  Surgeon: Darlean Ozell NOVAK, MD;  Location: [facility];  Service: Endoscopy;  Laterality: Bilateral;   Social History:  reports that she has never smoked. She has never used smokeless tobacco. She reports that she does not drink alcohol and does not use drugs.  Allergies  Allergen Reactions   Alendronate Other (See Comments)    made me choke  made me choke, chokes   Risedronate Sodium Other (See Comments)    made me choke     Sulfa Antibiotics Other (See Comments)    Doesn't remember    Sulfasalazine Other (See Comments)    Doesn't remember    Other Rash   Tetracyclines & Related Rash    Family History  Problem Relation Age of Onset   Hypertension Mother    Thyroid  disease Mother    Emphysema Father    Heart disease Father    Heart attack Father 42       Died   Rheum arthritis Sister  Heart disease Brother 41   Colon cancer Neg Hx    Stomach cancer Neg Hx    Sleep apnea Neg Hx     Prior to Admission medications   Medication Sig Start Date End Date Taking? Authorizing Provider  acetaminophen  (TYLENOL ) 325 MG tablet Take 650 mg by mouth at bedtime as needed for mild pain.    [provider]  albuterol  (VENTOLIN  HFA) 108 (90 Base) MCG/ACT inhaler Inhale 1-2 puffs into the lungs every 6 (six) hours as needed for wheezing or shortness of breath. 10/30/23   Patt Alm Macho, MD  Budeson-Glycopyrrol-Formoterol  (BREZTRI  AEROSPHERE) 160-9-4.8 MCG/ACT AERO Inhale 2 puffs into the lungs 2 (two) times daily. 11/15/23   Rollene Almarie LABOR, MD   busPIRone  (BUSPAR ) 5 MG tablet Take 1 tablet (5 mg total) by mouth 2 (two) times daily as needed. 12/06/23   Rollene Almarie LABOR, MD  Cholecalciferol  (VITAMIN D3) 50 MCG (2000 UT) capsule Take 4,000 Units by mouth daily.     [provider]  ciprofloxacin  (CIPRO ) 500 MG tablet Take 1 tablet (500 mg total) by mouth 2 (two) times daily. 03/08/24   Stacia Glendia BRAVO, MD  hyoscyamine  (LEVSIN  SL) 0.125 MG SL tablet Take    1 to 2 tablets     every 4 hours       as needed for Nausea, Cramping, Bloating or Diarrhea DIARRHEA 03/08/24   Stacia Glendia BRAVO, MD  levothyroxine  (SYNTHROID ) 50 MCG tablet TAKE 1 TABLET DAILY ON AN EMPTY STOMACH WITH ONLY WATER FOR 30 MINUTES. NO ANTACID MEDS, CALCIUM  OR MAGNESIUM FOR 4 HOURS AND AVOID BIOTIN 04/22/23   Wilkinson, Dana E, NP  Magnesium 250 MG TABS Take 1 tablet by mouth daily as needed.    [provider]  ondansetron  (ZOFRAN ) 8 MG tablet Take 1/2 to 1 tablet 3 x /day (every 6 hours) if needed for Nausea 08/03/23   Tonita Fallow, MD  rosuvastatin  (CRESTOR ) 5 MG tablet TAKE 1/4 TABLET BY MOUTH DAILY FOR CHOLESTEROL 02/20/24   Rollene Almarie LABOR, MD  Spacer/Aero-Holding Chambers (EASIVENT MASK SMALL) MISC See admin instructions. 03/18/16   [provider]  vitamin C  (ASCORBIC ACID ) 500 MG tablet Take 500 mg by mouth every other day.    [provider]    Physical Exam: Vitals:   04/22/24 1100 04/22/24 1315 04/22/24 1400 04/22/24 1410  BP: 135/66 122/74 128/73   Pulse: 75 75 79   Resp: (!) 21 19 14    Temp:    98.1 F (36.7 C)  TempSrc:    Oral  SpO2: 99% 97% 97%   Weight:      Height:       General - Elderly Caucasian female, no apparent distress HEENT - PERRLA, EOMI, atraumatic head, non tender sinuses. Lung - Clear, rales, rhonchi, wheezes. Heart - S1, S2 heard, no murmurs, rubs, trace pedal edema. Abdomen-soft, nontender, non distended, bowel sounds good Neuro - Alert, awake and oriented x 3, non focal  exam. Skin - Warm and dry. Data Reviewed:     Latest Ref Rng & Units 04/22/2024   10:25 AM 02/27/2024   11:48 AM 11/11/2023    6:34 PM  CBC  WBC 4.0 - 10.5 K/uL 15.6  4.2  5.6   Hemoglobin 12.0 - 15.0 g/dL 87.1  86.4  85.2   Hematocrit 36.0 - 46.0 % 38.6  40.6  44.9   Platelets 150 - 400 K/uL 159  168.0  257       Latest  Ref Rng & Units 04/22/2024   11:16 AM 02/27/2024   11:48 AM 11/11/2023    6:34 PM  BMP  Glucose 70 - 99 mg/dL 878  84  99   BUN 8 - 23 mg/dL 24  21  15    Creatinine 0.44 - 1.00 mg/dL 8.78  8.91  8.98   Sodium 135 - 145 mmol/L 139  137  137   Potassium 3.5 - 5.1 mmol/L 4.3  4.0  3.8   Chloride 98 - 111 mmol/L 102  101  104   CO2 22 - 32 mmol/L 26  30  24    Calcium  8.9 - 10.3 mg/dL 9.1  9.7  9.4    CT Angio Chest/Abd/Pel for Dissection W and/or Wo Contrast Result Date: 04/22/2024 CLINICAL DATA:  Acute aortic syndrome suspected. EXAM: CT ANGIOGRAPHY CHEST, ABDOMEN AND PELVIS TECHNIQUE: Multidetector CT imaging through the chest was performed prior to IV contrast administration. Multidetector CT imaging through the chest, abdomen and pelvis was performed using the standard protocol during bolus administration of intravenous contrast. Multiplanar reconstructed images and MIPs were obtained and reviewed to evaluate the vascular anatomy. RADIATION DOSE REDUCTION: This exam was performed according to the departmental dose-optimization program which includes automated exposure control, adjustment of the mA and/or kV according to patient size and/or use of iterative reconstruction technique. CONTRAST:  OMNIPAQUE  IOHEXOL  350 MG/ML SOLN COMPARISON:  CT chest 11/12/2023 and CT abdomen pelvis 10/30/2023. FINDINGS: CTA CHEST FINDINGS Cardiovascular: No aortic intramural hematoma on precontrast imaging and no dissection flap or aneurysm on postcontrast imaging. Atherosclerotic calcification of the aorta and coronary arteries. Heart is enlarged. No pericardial effusion.  Mediastinum/Nodes: No pathologically enlarged mediastinal, hilar or axillary lymph nodes. Esophagus is grossly unremarkable. Lungs/Pleura: Biapical pleuroparenchymal scarring. Scattered peribronchovascular nodularity, unchanged and likely postinfectious in etiology. Scarring in the medial segment right middle lobe. No pleural fluid. Airway is unremarkable. Musculoskeletal: Degenerative changes in the spine. Review of the MIP images confirms the above findings. CTA ABDOMEN AND PELVIS FINDINGS VASCULAR Aorta: Atherosclerotic calcification. No aneurysm or dissection flap. Celiac: Ostial atherosclerotic calcification with slight narrowing. Otherwise patent. SMA: Mild ostial calcification. Atherosclerotic calcification causes approximately 40% luminal narrowing. Renals: Marked ostial calcification bilaterally with associated narrowing, greater than 50% on the right and likely upwards of 90% on the left. IMA: Ostial calcification, patent. Inflow: Atherosclerotic calcification, patent. Veins: Poorly evaluated due to lack contrast opacification. Review of the MIP images confirms the above findings. NON-VASCULAR Hepatobiliary: Small left hepatic lobe cyst. No specific follow-up necessary. Liver is otherwise grossly unremarkable. Cholecystectomy. No unexpected biliary ductal dilatation. Pancreas: Negative. Spleen: Negative. Adrenals/Urinary Tract: Adrenal glands are unremarkable. Ptotic right kidney. Mildly atrophic left kidney with cortical thinning and decreased parenchymal attenuation. Ureters are decompressed. Stomach/Bowel: Stomach, small bowel and colon are unremarkable. Appendix is not well-visualized. Lymphatic: No pathologically enlarged lymph nodes. Reproductive: No adnexal mass. Streak artifact from a metallic structure over the right hip degrades image quality in the pelvis. Other: Small bilateral inguinal hernias contain fat. Trace pelvic free fluid. Musculoskeletal: Degenerative changes in the spine. Grade 1  anterolisthesis of L5 on S1 with chronic bilateral L5 pars defects. Review of the MIP images confirms the above findings. IMPRESSION: 1. No evidence of acute aortic syndrome or aortic aneurysm. 2. Aortic atherosclerosis (ICD10-I70.0). Associated stenoses involving the celiac trunk, superior mesenteric artery and renal arteries. 3. Atrophic left kidney cortical thinning and evidence of decreased function. 4. Coronary artery calcification. Electronically Signed   By: Newell Eke M.D.  On: 04/22/2024 13:28   DG Chest Port 1 View Result Date: 04/22/2024 CLINICAL DATA:  Chest pain EXAM: PORTABLE CHEST 1 VIEW COMPARISON:  11/11/2023 FINDINGS: Single AP view of the chest demonstrates wires and leads projecting over the chest. A linear metallic foreign body projects over the left apex, presumably external to the patient. Patient rotated right. Midline trachea. Normal heart size. Atherosclerosis in the transverse aorta. No pleural effusion or pneumothorax. Hyperinflation. Clear lungs. IMPRESSION: 1. Hyperinflation, without acute disease. 2. Aortic Atherosclerosis (ICD10-I70.0). Electronically Signed   By: Rockey Kilts M.D.   On: 04/22/2024 10:41     Assessment and Plan: KALISI BEVILL is a 83 y.o. female with medical history significant of asthma, anxiety, irritable bowel syndrome, essential tremors presented to the emergency department for evaluation of chest pain.  Chest pain seems atypical, she also reported having odynophagia, loose stools.  Plan: Atypical chest pain: Admit the patient to medical telemetry service under observation. Troponins x 2 negative.  EKG unremarkable. Will check echocardiogram. Continue nitroglycerin  as needed. Will give trial of Maalox, PPI.  AKI: Likely prerenal. Gentle IV fluids. Encourage oral diet. Avoid nephrotoxic drugs. Monitor daily renal function.  Dysphagia/odynophagia- SLP evaluation.  Asthma-no exacerbation. Continue bronchodilators, home  inhalers.    Advance Care Planning:   Code Status: Full Code discussed with patient  Consults: none  Family Communication: no family at bedside  Severity of Illness: The appropriate patient status for this patient is OBSERVATION. Observation status is judged to be reasonable and necessary in order to provide the required intensity of service to ensure the patient's safety. The patient's presenting symptoms, physical exam findings, and initial radiographic and laboratory data in the context of their medical condition is felt to place them at decreased risk for further clinical deterioration. Furthermore, it is anticipated that the patient will be medically stable for discharge from the hospital within 2 midnights of admission.   Author: Concepcion Riser, MD 04/22/2024 2:46 PM  For on call review [email or web address].

## 2024-04-22 NOTE — ED Provider Notes (Signed)
 [place] EMERGENCY DEPARTMENT AT [facility] Provider Note   CSN: [id number] Arrival date & time: 04/22/24  9045     Patient presents with: Chest Pain and Diarrhea   Shannon Parsons is a 83 y.o. female.   The history is provided by the patient and medical records. No language interpreter was used.  Chest Pain Diarrhea    83 year old female history of hyperlipidemia, hypertension, OSA, lymphoma, asthma, brought here via EMS from urgent care center for evaluation of chest pain.  Patient endorsed severe upper chest pain that radiates to her back into her neck with trouble swallowing.  Symptoms started this morning when she woke up around 5 AM.  Pain has been persistent mildly improved with aspirin  and sublingual nitro that was given in via EMS.  She did have some loose stool this morning.  She denies having nausea or vomiting no shortness of breath or productive cough no lightheadedness or dizziness no significant abdominal pain no numbness or weakness.  She was working in the garden couple days ago but denies any significant pain at that time.  Denies any significant cardiac history but states there is a family history of cardiac disease.  She denies alcohol or tobacco use.  Prior to Admission medications   Medication Sig Start Date End Date Taking? Authorizing Provider  acetaminophen  (TYLENOL ) 325 MG tablet Take 650 mg by mouth at bedtime as needed for mild pain.    [provider]  albuterol  (VENTOLIN  HFA) 108 (90 Base) MCG/ACT inhaler Inhale 1-2 puffs into the lungs every 6 (six) hours as needed for wheezing or shortness of breath. 10/30/23   Patt Alm Macho, MD  Budeson-Glycopyrrol-Formoterol  (BREZTRI  AEROSPHERE) 160-9-4.8 MCG/ACT AERO Inhale 2 puffs into the lungs 2 (two) times daily. 11/15/23   Rollene Almarie LABOR, MD  busPIRone  (BUSPAR ) 5 MG tablet Take 1 tablet (5 mg total) by mouth 2 (two) times daily as needed. 12/06/23   Rollene Almarie LABOR, MD   Cholecalciferol  (VITAMIN D3) 50 MCG (2000 UT) capsule Take 4,000 Units by mouth daily.     [provider]  ciprofloxacin  (CIPRO ) 500 MG tablet Take 1 tablet (500 mg total) by mouth 2 (two) times daily. 03/08/24   Stacia Glendia BRAVO, MD  hyoscyamine  (LEVSIN  SL) 0.125 MG SL tablet Take    1 to 2 tablets     every 4 hours       as needed for Nausea, Cramping, Bloating or Diarrhea DIARRHEA 03/08/24   Stacia Glendia BRAVO, MD  levothyroxine  (SYNTHROID ) 50 MCG tablet TAKE 1 TABLET DAILY ON AN EMPTY STOMACH WITH ONLY WATER FOR 30 MINUTES. NO ANTACID MEDS, CALCIUM  OR MAGNESIUM FOR 4 HOURS AND AVOID BIOTIN 04/22/23   Wilkinson, Dana E, NP  Magnesium 250 MG TABS Take 1 tablet by mouth daily as needed.    [provider]  ondansetron  (ZOFRAN ) 8 MG tablet Take 1/2 to 1 tablet 3 x /day (every 6 hours) if needed for Nausea 08/03/23   Tonita Fallow, MD  rosuvastatin  (CRESTOR ) 5 MG tablet TAKE 1/4 TABLET BY MOUTH DAILY FOR CHOLESTEROL 02/20/24   Rollene Almarie LABOR, MD  Spacer/Aero-Holding Chambers (EASIVENT MASK SMALL) MISC See admin instructions. 03/18/16   [provider]  vitamin C  (ASCORBIC ACID ) 500 MG tablet Take 500 mg by mouth every other day.    [provider]    Allergies: Alendronate, Risedronate sodium, Sulfa antibiotics, Sulfasalazine, Other, and Tetracyclines & related    Review of Systems  Cardiovascular:  Positive for  chest pain.  Gastrointestinal:  Positive for diarrhea.  All other systems reviewed and are negative.   Updated Vital Signs BP 128/73   Pulse 79   Temp 98.1 F (36.7 C) (Oral)   Resp 14   Ht 5' 4 (1.626 m)   Wt 43.1 kg   LMP 09/14/2014   SpO2 97%   BMI 16.31 kg/m   Physical Exam Vitals and nursing note reviewed.  Constitutional:      General: She is not in acute distress.    Appearance: She is well-developed.  HENT:     Head: Atraumatic.  Eyes:     Conjunctiva/sclera: Conjunctivae normal.  Neck:     Vascular: No carotid  bruit.  Cardiovascular:     Rate and Rhythm: Normal rate and regular rhythm.     Pulses: Normal pulses.     Heart sounds: Normal heart sounds.  Pulmonary:     Effort: Pulmonary effort is normal.     Breath sounds: No wheezing, rhonchi or rales.  Chest:     Chest wall: No tenderness.  Abdominal:     Palpations: Abdomen is soft.     Tenderness: There is abdominal tenderness (Mild epigastric tenderness no guarding no rebound tenderness.  No abdominal bruit).  Musculoskeletal:     Cervical back: Normal range of motion and neck supple. No rigidity or tenderness.     Right lower leg: No edema.     Left lower leg: No edema.  Lymphadenopathy:     Cervical: No cervical adenopathy.  Skin:    Capillary Refill: Capillary refill takes less than 2 seconds.     Findings: No rash.  Neurological:     Mental Status: She is alert and oriented to person, place, and time.  Psychiatric:        Mood and Affect: Mood normal.     (all labs ordered are listed, but only abnormal results are displayed) Labs Reviewed  CBC - Abnormal; Notable for the following components:      Result Value   WBC 15.6 (*)    All other components within normal limits  LIPASE, BLOOD - Abnormal; Notable for the following components:   Lipase 52 (*)    All other components within normal limits  HEPATIC FUNCTION PANEL - Abnormal; Notable for the following components:   Total Protein 5.4 (*)    Albumin 3.4 (*)    Total Bilirubin 1.3 (*)    Bilirubin, Direct 0.3 (*)    Indirect Bilirubin 1.0 (*)    All other components within normal limits  BASIC METABOLIC PANEL WITH GFR - Abnormal; Notable for the following components:   Glucose, Bld 121 (*)    BUN 24 (*)    Creatinine, Ser 1.21 (*)    GFR, Estimated 45 (*)    All other components within normal limits  TROPONIN I (HIGH SENSITIVITY) - Abnormal; Notable for the following components:   Troponin I (High Sensitivity) 23 (*)    All other components within normal limits   RESP PANEL BY RT-PCR (RSV, FLU A&B, COVID)  [id number]  TROPONIN I (HIGH SENSITIVITY)    EKG: None  Date: 04/22/2024  Rate: 86  Rhythm: normal sinus rhythm  QRS Axis: normal  Intervals: normal  ST/T Wave abnormalities: normal  Conduction Disutrbances: none  Narrative Interpretation:   Old EKG Reviewed: No significant changes noted    Radiology: CT Angio Chest/Abd/Pel for Dissection W and/or Wo Contrast Result Date: 04/22/2024 CLINICAL DATA:  Acute aortic syndrome suspected.  EXAM: CT ANGIOGRAPHY CHEST, ABDOMEN AND PELVIS TECHNIQUE: Multidetector CT imaging through the chest was performed prior to IV contrast administration. Multidetector CT imaging through the chest, abdomen and pelvis was performed using the standard protocol during bolus administration of intravenous contrast. Multiplanar reconstructed images and MIPs were obtained and reviewed to evaluate the vascular anatomy. RADIATION DOSE REDUCTION: This exam was performed according to the departmental dose-optimization program which includes automated exposure control, adjustment of the mA and/or kV according to patient size and/or use of iterative reconstruction technique. CONTRAST:  OMNIPAQUE  IOHEXOL  350 MG/ML SOLN COMPARISON:  CT chest 11/12/2023 and CT abdomen pelvis 10/30/2023. FINDINGS: CTA CHEST FINDINGS Cardiovascular: No aortic intramural hematoma on precontrast imaging and no dissection flap or aneurysm on postcontrast imaging. Atherosclerotic calcification of the aorta and coronary arteries. Heart is enlarged. No pericardial effusion. Mediastinum/Nodes: No pathologically enlarged mediastinal, hilar or axillary lymph nodes. Esophagus is grossly unremarkable. Lungs/Pleura: Biapical pleuroparenchymal scarring. Scattered peribronchovascular nodularity, unchanged and likely postinfectious in etiology. Scarring in the medial segment right middle lobe. No pleural fluid. Airway is unremarkable. Musculoskeletal: Degenerative changes  in the spine. Review of the MIP images confirms the above findings. CTA ABDOMEN AND PELVIS FINDINGS VASCULAR Aorta: Atherosclerotic calcification. No aneurysm or dissection flap. Celiac: Ostial atherosclerotic calcification with slight narrowing. Otherwise patent. SMA: Mild ostial calcification. Atherosclerotic calcification causes approximately 40% luminal narrowing. Renals: Marked ostial calcification bilaterally with associated narrowing, greater than 50% on the right and likely upwards of 90% on the left. IMA: Ostial calcification, patent. Inflow: Atherosclerotic calcification, patent. Veins: Poorly evaluated due to lack contrast opacification. Review of the MIP images confirms the above findings. NON-VASCULAR Hepatobiliary: Small left hepatic lobe cyst. No specific follow-up necessary. Liver is otherwise grossly unremarkable. Cholecystectomy. No unexpected biliary ductal dilatation. Pancreas: Negative. Spleen: Negative. Adrenals/Urinary Tract: Adrenal glands are unremarkable. Ptotic right kidney. Mildly atrophic left kidney with cortical thinning and decreased parenchymal attenuation. Ureters are decompressed. Stomach/Bowel: Stomach, small bowel and colon are unremarkable. Appendix is not well-visualized. Lymphatic: No pathologically enlarged lymph nodes. Reproductive: No adnexal mass. Streak artifact from a metallic structure over the right hip degrades image quality in the pelvis. Other: Small bilateral inguinal hernias contain fat. Trace pelvic free fluid. Musculoskeletal: Degenerative changes in the spine. Grade 1 anterolisthesis of L5 on S1 with chronic bilateral L5 pars defects. Review of the MIP images confirms the above findings. IMPRESSION: 1. No evidence of acute aortic syndrome or aortic aneurysm. 2. Aortic atherosclerosis (ICD10-I70.0). Associated stenoses involving the celiac trunk, superior mesenteric artery and renal arteries. 3. Atrophic left kidney cortical thinning and evidence of decreased  function. 4. Coronary artery calcification. Electronically Signed   By: Newell Eke M.D.   On: 04/22/2024 13:28   DG Chest Port 1 View Result Date: 04/22/2024 CLINICAL DATA:  Chest pain EXAM: PORTABLE CHEST 1 VIEW COMPARISON:  11/11/2023 FINDINGS: Single AP view of the chest demonstrates wires and leads projecting over the chest. A linear metallic foreign body projects over the left apex, presumably external to the patient. Patient rotated right. Midline trachea. Normal heart size. Atherosclerosis in the transverse aorta. No pleural effusion or pneumothorax. Hyperinflation. Clear lungs. IMPRESSION: 1. Hyperinflation, without acute disease. 2. Aortic Atherosclerosis (ICD10-I70.0). Electronically Signed   By: Rockey Kilts M.D.   On: 04/22/2024 10:41     .Critical Care  Performed by: Nivia Colon, PA-C Authorized by: Nivia Colon, PA-C   Critical care provider statement:    Critical care time (minutes):  30   Critical care was time  spent personally by me on the following activities:  Development of treatment plan with patient or surrogate, discussions with consultants, evaluation of patient's response to treatment, examination of patient, ordering and review of laboratory studies, ordering and review of radiographic studies, ordering and performing treatments and interventions, pulse oximetry, re-evaluation of patient's condition and review of old charts    Medications Ordered in the ED  morphine  (PF) 4 MG/ML injection 4 mg (4 mg Intravenous Given 04/22/24 1148)  iohexol  (OMNIPAQUE ) 350 MG/ML injection 100 mL (100 mLs Intravenous Contrast Given 04/22/24 1256)                HEART Score: 5                    Medical Decision Making Amount and/or Complexity of Data Reviewed Labs: ordered. Radiology: ordered.  Risk Prescription drug management.   BP 128/73   Pulse 79   Temp 98.1 F (36.7 C) (Oral)   Resp 14   Ht 5' 4 (1.626 m)   Wt 43.1 kg   LMP 09/14/2014   SpO2 97%   BMI 16.31  kg/m   78:15 PM  83 year old female history of hyperlipidemia, hypertension, OSA, lymphoma, asthma, brought here via EMS from urgent care center for evaluation of chest pain.  Patient endorsed severe upper chest pain that radiates to her back into her neck with trouble swallowing.  Symptoms started this morning when she woke up around 5 AM.  Pain has been persistent mildly improved with aspirin  and sublingual nitro that was given in via EMS.  She did have some loose stool this morning.  She denies having nausea or vomiting no shortness of breath or productive cough no lightheadedness or dizziness no significant abdominal pain no numbness or weakness.  She was working in the garden couple days ago but denies any significant pain at that time.  Denies any significant cardiac history but states there is a family history of cardiac disease.  She denies alcohol or tobacco use.  On exam patient resting comfortably appears to be in no acute discomfort.  Heart with normal rate rhythm, lungs clear, abdomen soft with mild epigastric tenderness no abdominal bruit.  Intact radial pulse bilaterally no peripheral edema noted.  Given severe pain to her chest radiates to her back, will obtain dissection study to rule out aortic dissection.  Workup initiated.  -Labs ordered, independently viewed and interpreted by me.  Labs remarkable for mildly late lipase of 52.  Creatinine is 1.21.  COVID flu RSV test came back negative.  White count is elevated at 15.6.  Troponin is mildly elevated at 23. -The patient was maintained on a cardiac monitor.  I personally viewed and interpreted the cardiac monitored which showed an underlying rhythm of: Normal sinus rhythm -Imaging independently viewed and interpreted by me and I agree with radiologist's interpretation.  Result remarkable for CT scan of the chest abdomen pelvis showing no evidence of aneurysm or dissection.  Patient does have stenosis involving the celiac trunk superior  mesenteric artery and renal arteries.  She does have coronary artery calcification. -This patient presents to the ED for concern of chest pain, this involves an extensive number of treatment options, and is a complaint that carries with it a high risk of complications and morbidity.  The differential diagnosis includes dissection, ACS, GERD, gastritis, MSK, shingles, pneumonia, PE -Co morbidities that complicate the patient evaluation includes GERD, IBS, anemia, DVT, CAD, asthma -Treatment includes morphine  -Reevaluation of the  patient after these medicines showed that the patient improved -PCP office notes or outside notes reviewed -Discussion with specialist [facility] who agrees to admit pt for cardiac rule out -Escalation to admission/observation considered: patient agrees with admission  Although patient does have mildly elevated troponin along with having chest pain, I have considered starting heparin  for NSTEMI but I will defer that to the hospitalist.    Final diagnoses:  Chest pain, unspecified type  Elevated troponin    ED Discharge Orders     None          Nivia Colon, PA-C 04/22/24 1427    Pamella Ozell LABOR, DO 04/30/24 [phone]

## 2024-04-22 NOTE — ED Triage Notes (Signed)
 Pt presents with substernal chest pain, onset this morning around 0500. Pain woke her from sleep. Also states she has had a pain in her neck for a few days. ESABRA Pizza, PA-C at bedside to evaluate

## 2024-04-22 NOTE — ED Notes (Signed)
 EMS at bedside

## 2024-04-23 ENCOUNTER — Observation Stay (HOSPITAL_BASED_OUTPATIENT_CLINIC_OR_DEPARTMENT_OTHER)

## 2024-04-23 DIAGNOSIS — I10 Essential (primary) hypertension: Secondary | ICD-10-CM

## 2024-04-23 DIAGNOSIS — R079 Chest pain, unspecified: Secondary | ICD-10-CM

## 2024-04-23 DIAGNOSIS — I351 Nonrheumatic aortic (valve) insufficiency: Secondary | ICD-10-CM | POA: Insufficient documentation

## 2024-04-23 DIAGNOSIS — E038 Other specified hypothyroidism: Secondary | ICD-10-CM | POA: Diagnosis not present

## 2024-04-23 DIAGNOSIS — E43 Unspecified severe protein-calorie malnutrition: Secondary | ICD-10-CM | POA: Diagnosis not present

## 2024-04-23 DIAGNOSIS — E782 Mixed hyperlipidemia: Secondary | ICD-10-CM

## 2024-04-23 DIAGNOSIS — R0789 Other chest pain: Secondary | ICD-10-CM | POA: Diagnosis not present

## 2024-04-23 LAB — CBC
HCT: 37.3 % (ref 36.0–46.0)
Hemoglobin: 12.3 g/dL (ref 12.0–15.0)
MCH: 29.2 pg (ref 26.0–34.0)
MCHC: 33 g/dL (ref 30.0–36.0)
MCV: 88.6 fL (ref 80.0–100.0)
Platelets: 124 K/uL — ABNORMAL LOW (ref 150–400)
RBC: 4.21 MIL/uL (ref 3.87–5.11)
RDW: 14.4 % (ref 11.5–15.5)
WBC: 11.6 K/uL — ABNORMAL HIGH (ref 4.0–10.5)
nRBC: 0 % (ref 0.0–0.2)

## 2024-04-23 LAB — BASIC METABOLIC PANEL WITH GFR
Anion gap: 6 (ref 5–15)
BUN: 22 mg/dL (ref 8–23)
CO2: 26 mmol/L (ref 22–32)
Calcium: 8.6 mg/dL — ABNORMAL LOW (ref 8.9–10.3)
Chloride: 103 mmol/L (ref 98–111)
Creatinine, Ser: 1.18 mg/dL — ABNORMAL HIGH (ref 0.44–1.00)
GFR, Estimated: 47 mL/min — ABNORMAL LOW (ref >60)
Glucose, Bld: 102 mg/dL — ABNORMAL HIGH (ref 70–99)
Potassium: 4 mmol/L (ref 3.5–5.1)
Sodium: 135 mmol/L (ref 135–145)

## 2024-04-23 LAB — ECHOCARDIOGRAM COMPLETE
AR max vel: 1.17 cm2
AV Area VTI: 1.27 cm2
AV Area mean vel: 1.31 cm2
AV Mean grad: 7 mmHg
AV Peak grad: 12.4 mmHg
Ao pk vel: 1.76 m/s
Calc EF: 56.5 %
Height: 64 in
S' Lateral: 2.9 cm
Single Plane A2C EF: 59.7 %
Single Plane A4C EF: 50.8 %
Weight: 1453.27 [oz_av]

## 2024-04-23 LAB — LIPID PANEL
Cholesterol: 127 mg/dL (ref 0–200)
HDL: 69 mg/dL (ref >40)
LDL Cholesterol: 51 mg/dL (ref 0–99)
Total CHOL/HDL Ratio: 1.8 ratio
Triglycerides: 34 mg/dL (ref <150)
VLDL: 7 mg/dL (ref 0–40)

## 2024-04-23 LAB — HEMOGLOBIN A1C
Hgb A1c MFr Bld: 5.7 % — ABNORMAL HIGH (ref 4.8–5.6)
Mean Plasma Glucose: 117 mg/dL

## 2024-04-23 MED ORDER — ENSURE PLUS HIGH PROTEIN PO LIQD: 237.0000 mL | Freq: Three times a day (TID) | ORAL | 2 refills | Status: AC

## 2024-04-23 MED ORDER — ENSURE PLUS HIGH PROTEIN PO LIQD: 237.0000 mL | Freq: Three times a day (TID) | ORAL | Status: DC

## 2024-04-23 NOTE — Progress Notes (Signed)
 Reviewed AVS, patient expressed understanding of medications, MD follow up reviewed.   Removed IV, Site clean, dry and intact.  CCMD contacted and informed patients is being discharged.  Patient states all belongings brought to the hospital at time of admission are accounted for and packed to take home.  Patient informed and expressed understanding where to pick up discharge medications.  [facility] contacted to transport patient to Discharge lounge to wait for transportation home.

## 2024-04-23 NOTE — TOC Transition Note (Signed)
 Transition of Care [facility]) - Discharge Note   Patient Details  Name: Shannon Parsons MRN: [id number] Date of Birth: 1941/02/20  [facility] CM/SW Contact:  Waddell Barnie Rama, RN Phone Number: 04/23/2024, 1:49 PM   Clinical Narrative:    For dc today, spouse will transport her home. She has no needs.         Patient Goals and CMS Choice            Discharge Placement                       Discharge Plan and Services Additional resources added to the After Visit Summary for                                       Social Drivers of Health (SDOH) Interventions SDOH Screenings   Food Insecurity: No Food Insecurity (04/22/2024)  Housing: Low Risk  (04/22/2024)  Transportation Needs: No Transportation Needs (04/22/2024)  Utilities: Not At Risk (04/22/2024)  Alcohol Screen: Low Risk  (12/09/2023)  Depression (PHQ2-9): Medium Risk (04/13/2024)  Financial Resource Strain: Low Risk  (12/09/2023)  Physical Activity: Insufficiently Active (12/09/2023)  Social Connections: Socially Integrated (04/22/2024)  Stress: No Stress Concern Present (12/09/2023)  Tobacco Use: Low Risk  (04/22/2024)  Health Literacy: Adequate Health Literacy (12/09/2023)     Readmission Risk Interventions     No data to display

## 2024-04-23 NOTE — Plan of Care (Signed)
  Problem: Clinical Measurements: Goal: Will remain free from infection Outcome: Progressing Goal: Respiratory complications will improve Outcome: Progressing Goal: Cardiovascular complication will be avoided Outcome: Progressing   Problem: Activity: Goal: Risk for activity intolerance will decrease Outcome: Progressing   Problem: Coping: Goal: Level of anxiety will decrease Outcome: Progressing   Problem: Elimination: Goal: Will not experience complications related to bowel motility Outcome: Progressing Goal: Will not experience complications related to urinary retention Outcome: Progressing   Problem: Safety: Goal: Ability to remain free from injury will improve Outcome: Progressing

## 2024-04-23 NOTE — Care Management Obs Status (Signed)
 MEDICARE OBSERVATION STATUS NOTIFICATION   Patient Details  Name: Shannon Parsons MRN: [id number] Date of Birth: [birth year]   Medicare Observation Status Notification Given:  Yes    Vonzell Arrie Sharps 04/23/2024, 8:59 AM

## 2024-04-23 NOTE — Progress Notes (Signed)
 Mobility Specialist Progress Note:   04/23/24 1115  Mobility  Activity Ambulated with assistance  Level of Assistance Contact guard assist, steadying assist  Assistive Device None  Distance Ambulated (ft) 500 ft  Activity Response Tolerated well  Mobility Referral Yes  Mobility visit 1 Mobility  Mobility Specialist Start Time (ACUTE ONLY) 1115  Mobility Specialist Stop Time (ACUTE ONLY) 1126  Mobility Specialist Time Calculation (min) (ACUTE ONLY) 11 min   Pt agreeable to mobility session. Required contact assist to ambulate d/t minor unsteadiness, no overt LOB noted. Pt with no c/o throughout. Back sitting EOB with all needs met.   Therisa Rana Mobility Specialist Please contact via SecureChat or  Rehab office at [phone]

## 2024-04-23 NOTE — Progress Notes (Signed)
 Echocardiogram 2D Echocardiogram has been performed.  Shannon Parsons [name] RDCS 04/23/2024, 8:05 AM

## 2024-04-23 NOTE — Discharge Summary (Signed)
 Physician Discharge Summary   Patient: Shannon Parsons MRN: [id number] DOB: 10-07-1940  Admit date:     04/22/2024  Discharge date: 04/23/24  Discharge Physician: Concepcion Riser   PCP: Rollene Almarie LABOR, MD   Recommendations at discharge:    PCP follow up in 1 week. BMP follow up in 1 week. Moderate aortic regurgitation with sclerosis noted on echo.  Discharge Diagnoses: Principal Problem:   Chest pain Active Problems:   Hyperlipidemia, mixed   Hypothyroidism   Essential hypertension   Underweight (BMI < 18.5)   Aortic regurgitation   Protein-calorie malnutrition, severe (HCC)  Resolved Problems:   * No resolved hospital problems. *  Hospital Course: Shannon Parsons is a 83 y.o. female with medical history significant of asthma, anxiety, irritable bowel syndrome, essential tremors presented to the emergency department for evaluation of chest pain.  Patient states the pain started yesterday, associated with radiation to the neck, back.  No shortness of breath or palpitations.  No diaphoresis.  Patient did report pain in the neck while swallowing.  2 days ago she reported lifting heavy things.  Reported that this morning her pain worsened presented to ED.  She stated pain better with sublingual nitro. Patient does also have indigestion, gas discomfort, diarrhea with 4 loose stools yesterday.  Denies any fever, chills, rigors.  No abdominal pain, nausea or vomiting.   In the emergency department, she is hemodynamically stable, troponin mildly elevated at 23, lipase 52, creatinine 1.21, white count 15.6.  COVID, flu RSV negative.  EKG no ST-T wave changes.  ED provider requested hospitalist admission for further management evaluation of chest pain.  During hospital stay, her chest pain much improved. Throat pain, discomfort with swallowing still present, seen by SLP advised outpatient GI eval for persistent symptoms. She has severe malnutrition seen by dietician advised  supplements. Echo showed EF 55%, moderate aortic regurgitations. Chest pain unlikely cardiac in nature, advised to follow with PCP upon discharge. She understands and agree.        Consultants: none  Procedures performed: none  Disposition: Home Diet recommendation:  Discharge Diet Orders (From admission, onward)     Start     Ordered   04/23/24 0000  Diet - low sodium heart healthy        04/23/24 1308           Cardiac diet DISCHARGE MEDICATION: Allergies as of 04/23/2024       Reactions   Alendronate Other (See Comments)   made me choke made me choke, chokes   Risedronate Sodium Other (See Comments)   made me choke    Sulfa Antibiotics Other (See Comments)   Doesn't remember    Sulfasalazine Other (See Comments)   Doesn't remember    Other Rash   Tetracyclines & Related Rash        Medication List     STOP taking these medications    busPIRone  5 MG tablet Commonly known as: BUSPAR    ciprofloxacin  500 MG tablet Commonly known as: CIPRO        TAKE these medications    acetaminophen  325 MG tablet Commonly known as: TYLENOL  Take 650 mg by mouth at bedtime as needed for mild pain.   albuterol  108 (90 Base) MCG/ACT inhaler Commonly known as: VENTOLIN  HFA Inhale 1-2 puffs into the lungs every 6 (six) hours as needed for wheezing or shortness of breath.   b complex vitamins capsule Take 1 capsule by mouth daily.   Breztri  Aerosphere 160-9-4.8  MCG/ACT Aero inhaler Generic drug: budesonide -glycopyrrolate -formoterol  Inhale 2 puffs into the lungs 2 (two) times daily.   EasiVent Mask Small Misc See admin instructions.   feeding supplement Liqd Take 237 mLs by mouth 3 (three) times daily between meals.   hyoscyamine  0.125 MG SL tablet Commonly known as: LEVSIN  SL Take    1 to 2 tablets     every 4 hours       as needed for Nausea, Cramping, Bloating or Diarrhea DIARRHEA   levothyroxine  50 MCG tablet Commonly known as: SYNTHROID  TAKE 1  TABLET DAILY ON AN EMPTY STOMACH WITH ONLY WATER FOR 30 MINUTES. NO ANTACID MEDS, CALCIUM  OR MAGNESIUM FOR 4 HOURS AND AVOID BIOTIN   Magnesium 250 MG Tabs Take 1 tablet by mouth daily as needed (muscle cramps).   ondansetron  8 MG tablet Commonly known as: ZOFRAN  Take 1/2 to 1 tablet 3 x /day (every 6 hours) if needed for Nausea   rosuvastatin  5 MG tablet Commonly known as: CRESTOR  TAKE 1/4 TABLET BY MOUTH DAILY FOR CHOLESTEROL   Vitamin D3 50 MCG (2000 UT) capsule Take 4,000 Units by mouth daily.        Follow-up Information     Rollene Almarie LABOR, MD. Schedule an appointment as soon as possible for a visit in 1 week(s).   Specialty: Internal Medicine Contact information: [address] [place] [address] [phone]                Discharge Exam: Fredricka Weights   04/22/24 0956 04/22/24 1924 04/23/24 0437  Weight: 43.1 kg 43.8 kg 41.2 kg      04/23/2024   11:19 AM 04/23/2024    9:08 AM 04/23/2024    9:02 AM  Vitals with BMI  Systolic 153  166  Diastolic 70  73  Pulse 66 72 66    General - Elderly thin built Caucasian female, no apparent distress HEENT - PERRLA, EOMI, atraumatic head, non tender sinuses. Lung - Clear, rales, rhonchi, wheezes. Heart - S1, S2 heard, no murmurs, rubs, trace pedal edema. Abdomen-soft, nontender, non distended, bowel sounds good Neuro - Alert, awake and oriented x 3, non focal exam. Skin - Warm and dry.  Condition at discharge: stable  The results of significant diagnostics from this hospitalization (including imaging, microbiology, ancillary and laboratory) are listed below for reference.   Imaging Studies: ECHOCARDIOGRAM COMPLETE Result Date: 04/23/2024    ECHOCARDIOGRAM REPORT   Patient Name:   Shannon Parsons Date of Exam: 04/23/2024 Medical Rec #:  [id number]        Height:       64.0 in Accession #:    [id number]       Weight:       90.8 lb Date of Birth:  01-24-1941        BSA:          1.399 m Patient Age:     82 years         BP:           139/68 mmHg Patient Gender: F                HR:           84 bpm. Exam Location:  Inpatient Procedure: 2D Echo, Cardiac Doppler and Color Doppler (Both Spectral and Color            Flow Doppler were utilized during procedure). Indications:    R07.9* Chest pain, unspecified  History:  Patient has prior history of Echocardiogram examinations, most                 recent 02/21/2015. COPD; Risk Factors:Hypertension, Dyslipidemia                 and Sleep Apnea.  Sonographer:    Damien Senior RDCS Referring Phys: CONCEPCION RISER  Sonographer Comments: No true parasternals due to thin body habitus. IMPRESSIONS  1. Left ventricular ejection fraction, by estimation, is 55 to 60%. Left ventricular ejection fraction by 2D MOD biplane is 56.5 %. The left ventricle has normal function. The left ventricle has no regional wall motion abnormalities. Left ventricular diastolic parameters are indeterminate.  2. Right ventricular systolic function is normal. The right ventricular size is normal. There is mildly elevated pulmonary artery systolic pressure. The estimated right ventricular systolic pressure is 37.8 mmHg.  3. Left atrial size was mildly dilated.  4. There is no evidence of cardiac tamponade.  5. The mitral valve is degenerative. Moderate mitral valve regurgitation.  6. Tricuspid valve regurgitation is mild to moderate.  7. AI is moderate-severe with an ERO of 0.23cm^2, regurgitant volume of 44mL. The aortic valve is abnormal. Aortic valve regurgitation is moderate to severe. Aortic valve sclerosis is present, with no evidence of aortic valve stenosis.  8. The inferior vena cava is normal in size with <50% respiratory variability, suggesting right atrial pressure of 8 mmHg. Comparison(s): A prior study was performed on 2016. Prior images unable to be directly viewed, comparison made by report only. FINDINGS  Left Ventricle: Left ventricular ejection fraction, by estimation, is 55  to 60%. Left ventricular ejection fraction by 2D MOD biplane is 56.5 %. The left ventricle has normal function. The left ventricle has no regional wall motion abnormalities. The left ventricular internal cavity size was normal in size. There is no left ventricular hypertrophy. Left ventricular diastolic parameters are indeterminate. Right Ventricle: The right ventricular size is normal. No increase in right ventricular wall thickness. Right ventricular systolic function is normal. There is mildly elevated pulmonary artery systolic pressure. The tricuspid regurgitant velocity is 2.73  m/s, and with an assumed right atrial pressure of 8 mmHg, the estimated right ventricular systolic pressure is 37.8 mmHg. Left Atrium: Left atrial size was mildly dilated. Right Atrium: Right atrial size was normal in size. Pericardium: Trivial pericardial effusion is present. The pericardial effusion is lateral to the left ventricle. There is no evidence of cardiac tamponade. Mitral Valve: The mitral valve is degenerative in appearance. There is mild thickening of the mitral valve leaflet(s). Normal mobility of the mitral valve leaflets. Moderate mitral valve regurgitation. Tricuspid Valve: The tricuspid valve is normal in structure. Tricuspid valve regurgitation is mild to moderate. Aortic Valve: AI is moderate-severe with an ERO of 0.23cm^2, regurgitant volume of 44mL. The aortic valve is abnormal. Aortic valve regurgitation is moderate to severe. Aortic valve sclerosis is present, with no evidence of aortic valve stenosis. Aortic valve mean gradient measures 7.0 mmHg. Aortic valve peak gradient measures 12.4 mmHg. Aortic valve area, by VTI measures 1.27 cm. Pulmonic Valve: The pulmonic valve was not well visualized. Pulmonic valve regurgitation is not visualized. Aorta: The aortic root is normal in size and structure and the ascending aorta was not well visualized. Venous: The inferior vena cava is normal in size with less than 50%  respiratory variability, suggesting right atrial pressure of 8 mmHg. IAS/Shunts: The interatrial septum appears to be lipomatous. No atrial level shunt detected by color flow Doppler.  LEFT VENTRICLE PLAX 2D                        Biplane EF (MOD) LVIDd:         3.80 cm         LV Biplane EF:   Left LVIDs:         2.90 cm                          ventricular LV PW:         1.00 cm                          ejection LV IVS:        0.80 cm                          fraction by LVOT diam:     1.70 cm                          2D MOD LV SV:         45                               biplane is LV SV Index:   32                               56.5 %. LVOT Area:     2.27 cm                                Diastology                                LV e' medial:  7.62 cm/s LV Volumes (MOD)               LV e' lateral: 17.30 cm/s LV vol d, MOD    89.8 ml A2C: LV vol d, MOD    69.1 ml A4C: LV vol s, MOD    36.2 ml A2C: LV vol s, MOD    34.0 ml A4C: LV SV MOD A2C:   53.6 ml LV SV MOD A4C:   69.1 ml LV SV MOD BP:    45.4 ml LEFT ATRIUM             Index        RIGHT ATRIUM           Index LA diam:        3.00 cm 2.14 cm/m   RA Area:     12.90 cm LA Vol (A2C):   53.5 ml 38.25 ml/m  RA Volume:   30.30 ml  21.66 ml/m LA Vol (A4C):   52.5 ml 37.53 ml/m LA Biplane Vol: 54.5 ml 38.96 ml/m  AORTIC VALVE AV Area (Vmax):    1.17 cm AV Area (Vmean):   1.31 cm AV Area (VTI):     1.27 cm AV Vmax:           176.00 cm/s AV Vmean:          122.000 cm/s AV VTI:  0.353 m AV Peak Grad:      12.4 mmHg AV Mean Grad:      7.0 mmHg LVOT Vmax:         91.00 cm/s LVOT Vmean:        70.500 cm/s LVOT VTI:          0.197 m LVOT/AV VTI ratio: 0.56  AORTA Ao Root diam: 2.60 cm TRICUSPID VALVE TR Peak grad:   29.8 mmHg TR Vmax:        273.00 cm/s  SHUNTS Systemic VTI:  0.20 m Systemic Diam: 1.70 cm Sunit Tolia Electronically signed by Madonna Large Signature Date/Time: 04/23/2024/12:15:26 PM    Final    CT Angio Chest/Abd/Pel for Dissection W  and/or Wo Contrast Result Date: 04/22/2024 CLINICAL DATA:  Acute aortic syndrome suspected. EXAM: CT ANGIOGRAPHY CHEST, ABDOMEN AND PELVIS TECHNIQUE: Multidetector CT imaging through the chest was performed prior to IV contrast administration. Multidetector CT imaging through the chest, abdomen and pelvis was performed using the standard protocol during bolus administration of intravenous contrast. Multiplanar reconstructed images and MIPs were obtained and reviewed to evaluate the vascular anatomy. RADIATION DOSE REDUCTION: This exam was performed according to the departmental dose-optimization program which includes automated exposure control, adjustment of the mA and/or kV according to patient size and/or use of iterative reconstruction technique. CONTRAST:  OMNIPAQUE  IOHEXOL  350 MG/ML SOLN COMPARISON:  CT chest 11/12/2023 and CT abdomen pelvis 10/30/2023. FINDINGS: CTA CHEST FINDINGS Cardiovascular: No aortic intramural hematoma on precontrast imaging and no dissection flap or aneurysm on postcontrast imaging. Atherosclerotic calcification of the aorta and coronary arteries. Heart is enlarged. No pericardial effusion. Mediastinum/Nodes: No pathologically enlarged mediastinal, hilar or axillary lymph nodes. Esophagus is grossly unremarkable. Lungs/Pleura: Biapical pleuroparenchymal scarring. Scattered peribronchovascular nodularity, unchanged and likely postinfectious in etiology. Scarring in the medial segment right middle lobe. No pleural fluid. Airway is unremarkable. Musculoskeletal: Degenerative changes in the spine. Review of the MIP images confirms the above findings. CTA ABDOMEN AND PELVIS FINDINGS VASCULAR Aorta: Atherosclerotic calcification. No aneurysm or dissection flap. Celiac: Ostial atherosclerotic calcification with slight narrowing. Otherwise patent. SMA: Mild ostial calcification. Atherosclerotic calcification causes approximately 40% luminal narrowing. Renals: Marked ostial  calcification bilaterally with associated narrowing, greater than 50% on the right and likely upwards of 90% on the left. IMA: Ostial calcification, patent. Inflow: Atherosclerotic calcification, patent. Veins: Poorly evaluated due to lack contrast opacification. Review of the MIP images confirms the above findings. NON-VASCULAR Hepatobiliary: Small left hepatic lobe cyst. No specific follow-up necessary. Liver is otherwise grossly unremarkable. Cholecystectomy. No unexpected biliary ductal dilatation. Pancreas: Negative. Spleen: Negative. Adrenals/Urinary Tract: Adrenal glands are unremarkable. Ptotic right kidney. Mildly atrophic left kidney with cortical thinning and decreased parenchymal attenuation. Ureters are decompressed. Stomach/Bowel: Stomach, small bowel and colon are unremarkable. Appendix is not well-visualized. Lymphatic: No pathologically enlarged lymph nodes. Reproductive: No adnexal mass. Streak artifact from a metallic structure over the right hip degrades image quality in the pelvis. Other: Small bilateral inguinal hernias contain fat. Trace pelvic free fluid. Musculoskeletal: Degenerative changes in the spine. Grade 1 anterolisthesis of L5 on S1 with chronic bilateral L5 pars defects. Review of the MIP images confirms the above findings. IMPRESSION: 1. No evidence of acute aortic syndrome or aortic aneurysm. 2. Aortic atherosclerosis (ICD10-I70.0). Associated stenoses involving the celiac trunk, superior mesenteric artery and renal arteries. 3. Atrophic left kidney cortical thinning and evidence of decreased function. 4. Coronary artery calcification. Electronically Signed   By: Newell Eke M.D.   On: 04/22/2024  13:28   DG Chest Port 1 View Result Date: 04/22/2024 CLINICAL DATA:  Chest pain EXAM: PORTABLE CHEST 1 VIEW COMPARISON:  11/11/2023 FINDINGS: Single AP view of the chest demonstrates wires and leads projecting over the chest. A linear metallic foreign body projects over the left  apex, presumably external to the patient. Patient rotated right. Midline trachea. Normal heart size. Atherosclerosis in the transverse aorta. No pleural effusion or pneumothorax. Hyperinflation. Clear lungs. IMPRESSION: 1. Hyperinflation, without acute disease. 2. Aortic Atherosclerosis (ICD10-I70.0). Electronically Signed   By: Rockey Kilts M.D.   On: 04/22/2024 10:41   MM Digital Diagnostic Bilat Result Date: 03/30/2024 CLINICAL DATA:  Screening recall BILATERAL calcifications. EXAM: DIGITAL DIAGNOSTIC BILATERAL MAMMOGRAM WITH CAD TECHNIQUE: Bilateral digital diagnostic mammography was performed. COMPARISON:  Previous exam(s). ACR Breast Density Category c: The breasts are heterogeneously dense, which may obscure small masses. FINDINGS: Full field ML and BILATERAL spot magnification views were performed of BILATERAL calcifications identified at the time of screening. There are amorphous, relatively diffuse calcifications which have in retrospect slowly increased when compared to remote prior mammograms. No suspicious focal group or other worrisome finding. Redemonstrated dystrophic and popcorn calcifications in both breasts. IMPRESSION: Diffuse amorphous calcifications which have symmetrically increased over time are probably benign. RECOMMENDATION: Six-month follow-up diagnostic mammogram of the BILATERAL breasts. If no significant change at that time, return to screening could be considered at the interpreting radiologist's discretion. I have discussed the findings and recommendations with the patient. If applicable, a reminder letter will be sent to the patient regarding the next appointment. BI-RADS CATEGORY  3: Probably benign. Electronically Signed   By: Norleen Croak M.D.   On: 03/30/2024 12:28    Microbiology: Results for orders placed or performed during the hospital encounter of 04/22/24  Resp panel by RT-PCR (RSV, Flu A&B, Covid) Anterior Nasal Swab     Status: None   Collection Time: 04/22/24  11:14 AM   Specimen: Anterior Nasal Swab  Result Value Ref Range Status   SARS Coronavirus 2 by RT PCR NEGATIVE NEGATIVE Final   Influenza A by PCR NEGATIVE NEGATIVE Final   Influenza B by PCR NEGATIVE NEGATIVE Final    Comment: (NOTE) The Xpert Xpress SARS-CoV-2/FLU/RSV plus assay is intended as an aid in the diagnosis of influenza from Nasopharyngeal swab specimens and should not be used as a sole basis for treatment. Nasal washings and aspirates are unacceptable for Xpert Xpress SARS-CoV-2/FLU/RSV testing.  Fact Sheet for Patients: BloggerCourse.com  Fact Sheet for Healthcare Providers: SeriousBroker.it  This test is not yet approved or cleared by the [place]  FDA and has been authorized for detection and/or diagnosis of SARS-CoV-2 by FDA under an Emergency Use Authorization (EUA). This EUA will remain in effect (meaning this test can be used) for the duration of the COVID-19 declaration under Section 564(b)(1) of the Act, 21 U.S.C. section 360bbb-3(b)(1), unless the authorization is terminated or revoked.     Resp Syncytial Virus by PCR NEGATIVE NEGATIVE Final    Comment: (NOTE) Fact Sheet for Patients: BloggerCourse.com  Fact Sheet for Healthcare Providers: SeriousBroker.it  This test is not yet approved or cleared by the [place]  FDA and has been authorized for detection and/or diagnosis of SARS-CoV-2 by FDA under an Emergency Use Authorization (EUA). This EUA will remain in effect (meaning this test can be used) for the duration of the COVID-19 declaration under Section 564(b)(1) of the Act, 21 U.S.C. section 360bbb-3(b)(1), unless the authorization is terminated or revoked.  Performed at [facility]  [facility], [address], [place], [place] [address]     Labs: CBC: Recent Labs  Lab 04/22/24 1025 04/23/24 0333  WBC 15.6* 11.6*  HGB 12.8 12.3  HCT  38.6 37.3  MCV 89.1 88.6  PLT 159 124*   Basic Metabolic Panel: Recent Labs  Lab 04/22/24 1116 04/23/24 0333  NA 139 135  K 4.3 4.0  CL 102 103  CO2 26 26  GLUCOSE 121* 102*  BUN 24* 22  CREATININE 1.21* 1.18*  CALCIUM  9.1 8.6*   Liver Function Tests: Recent Labs  Lab 04/22/24 1116  AST 33  ALT 17  ALKPHOS 42  BILITOT 1.3*  PROT 5.4*  ALBUMIN 3.4*   CBG: No results for input(s): GLUCAP in the last 168 hours.  Discharge time spent: 33 minutes.  Signed: Concepcion Riser, MD [facility] 04/23/2024

## 2024-04-23 NOTE — TOC CM/SW Note (Signed)
 Transition of Care [facility]) - Inpatient Brief Assessment   Patient Details  Name: Shannon Parsons MRN: [id number] Date of Birth: 1940-11-11  Transition of Care [facility]) CM/SW Contact:    Waddell Barnie Rama, RN Phone Number: 04/23/2024, 1:48 PM   Clinical Narrative: From home with spouse, has PCP and insurance on file, states has no HH services in place at this time or DME at home.  States family member will transport them home at [employer] and family is support system, states gets medications from [facility].  Pta self ambulatory.   There are no IP CM needs identified  at this time.  Please place consult for IP CM needs.     Transition of Care Asessment: Insurance and Status: Insurance coverage has been reviewed Patient has primary care physician: Yes Home environment has been reviewed: home with spouse Prior level of function:: indep Prior/Current Home Services: No current home services Social Drivers of Health Review: SDOH reviewed no interventions necessary Readmission risk has been reviewed: Yes Transition of care needs: no transition of care needs at this time

## 2024-04-23 NOTE — Care Management Obs Status (Deleted)
 MEDICARE OBSERVATION STATUS NOTIFICATION   Patient Details  Name: Shannon Parsons MRN: [id number] Date of Birth: January 08, 1941   Medicare Observation Status Notification Given:  Yes    Vonzell Arrie Sharps 04/23/2024, 8:34 AM

## 2024-04-23 NOTE — Progress Notes (Signed)
 Initial Nutrition Assessment  DOCUMENTATION CODES:   Severe malnutrition in context of chronic illness, Underweight  INTERVENTION:  -Liberalized diet to 2g Na+ menu, regular texture, thin liquids -Add Ensure Plus High Protein TID -Continue MVI -Discussed importance of adequate kcal/pro intake, encouraged increased intake  NUTRITION DIAGNOSIS:   Severe Malnutrition related to catabolic illness, chronic illness, cancer and cancer related treatments as evidenced by severe muscle depletion, severe fat depletion.  GOAL:   Patient will meet greater than or equal to 90% of their needs   MONITOR:   PO intake, Supplement acceptance, Weight trends, Labs, Skin  REASON FOR ASSESSMENT:   Consult Assessment of nutrition requirement/status  ASSESSMENT:   Hx asthma, anxiety, irritable bowel syndrome, essential tremors presented to the emergency department for evaluation of chest pain  Spoke to pt at bedside. Pt denies n/v/c/d or chewing/swallowing difficulties. Last BM 8/10. Pt was having diarrhea prior to admission. NFPE completed (see below), pt meets criteria for [facility], is underweight. Pt states she has been stable at 90-95 lbs long term, however, initially lost large amount of weight in 2016-2017 with cancer dx/tx (leukemia). Pt was told her BMI is low; explained what the BMI scale is, what is ideal for older adults. Discussed importance of adequate kcal/pro intake. Pt states she eats well at home, documented meal intake 95% here. Discussed fortified foods, increasing kcal concentration of foods. Pt verbalized understanding. Handout provided in d/c instructions. Pt and husband express concern of possibility of cancer recurrence. Encouraged them to express concerns to MD. Pt/husband deny additional questions/concerns at this time, will continue to monitor, RDN available prn.   Labs BG 102 Cr 1.18 Calcium  8.6 Albumin 3.4 Lipase 52 GFR 47 WBC 11.6  Medications  ascorbic acid   500 mg  Oral QODAY   budesonide -glycopyrrolate -formoterol   2 puff Inhalation BID   cholecalciferol   4,000 Units Oral Daily   docusate sodium   100 mg Oral BID   enoxaparin  (LOVENOX ) injection  30 mg Subcutaneous Q24H   levothyroxine   50 mcg Oral Q0600   multivitamin with minerals  1 tablet Oral Daily   pantoprazole   40 mg Oral Daily   rosuvastatin   5 mg Oral Daily       NUTRITION - FOCUSED PHYSICAL EXAM:  Flowsheet Row Most Recent Value  Orbital Region Moderate depletion  Upper Arm Region Severe depletion  Thoracic and Lumbar Region Severe depletion  Buccal Region Moderate depletion  Temple Region Moderate depletion  Clavicle Bone Region Severe depletion  Clavicle and Acromion Bone Region Severe depletion  Scapular Bone Region Moderate depletion  Dorsal Hand Severe depletion  Patellar Region Severe depletion  Anterior Thigh Region Severe depletion  Posterior Calf Region Severe depletion  Edema (RD Assessment) None  Hair Reviewed  Eyes Reviewed  Mouth Reviewed  Skin Reviewed  Nails Reviewed    Diet Order:   Diet Order             Diet Heart Room service appropriate? Yes; Fluid consistency: Thin  Diet effective now                   EDUCATION NEEDS:   Education needs have been addressed  Skin:  Skin Assessment: Reviewed RN Assessment  Last BM:  8/10  Height:   Ht Readings from Last 1 Encounters:  04/22/24 5' 4 (1.626 m)    Weight:   Wt Readings from Last 1 Encounters:  04/23/24 41.2 kg    Ideal Body Weight:   54.5 kg  BMI:  Body  mass index is 15.59 kg/m.  Estimated Nutritional Needs:   Kcal:  1250-1450 kcal  Protein:  60-80 g  Fluid:  >/=1.5 L   Aden Youngman Daml-Budig, RDN, LDN Registered Dietitian Nutritionist RD Inpatient Contact Info in [place]

## 2024-04-24 ENCOUNTER — Ambulatory Visit: Payer: Self-pay | Admitting: Internal Medicine

## 2024-04-25 ENCOUNTER — Telehealth: Payer: Self-pay

## 2024-04-25 NOTE — Telephone Encounter (Signed)
 Copied from CRM [id number]. Topic: Clinical - Lab/Test Results >> Apr 25, 2024 12:38 PM Shannon Parsons wrote: Reason for CRM: Patient returning call, advised patient that her sugar levels are normal. Confirmed appt as well.

## 2024-04-25 NOTE — Transitions of Care (Post Inpatient/ED Visit) (Signed)
 04/25/2024  Name: Shannon Parsons MRN: [id number] DOB: [birth year]  Today's TOC FU Call Status: Today's TOC FU Call Status:: Successful TOC FU Call Completed Patient's Name and Date of Birth confirmed.  Transition Care Management Follow-up Telephone Call Date of Discharge: 04/24/24 Discharge Facility: [facility]) Type of Discharge: Inpatient Admission Primary Inpatient Discharge Diagnosis:: chest pain How have you been since you were released from the hospital?: Better Any questions or concerns?: No  Items Reviewed: Did you receive and understand the discharge instructions provided?: Yes Medications obtained,verified, and reconciled?: Yes (Medications Reviewed) Any new allergies since your discharge?: No Dietary orders reviewed?: Yes Do you have support at home?: No  Medications Reviewed Today: Medications Reviewed Today     Reviewed by Emmitt Pan, LPN (Licensed Practical Nurse) on 04/25/24 at 1439  Med List Status: <None>   Medication Order Taking? Sig Documenting Provider Last Dose Status Informant  acetaminophen  (TYLENOL ) 325 MG tablet [id number] Yes Take 650 mg by mouth at bedtime as needed for mild pain. [provider]  Active Self  albuterol  (VENTOLIN  HFA) 108 (90 Base) MCG/ACT inhaler [id number] Yes Inhale 1-2 puffs into the lungs every 6 (six) hours as needed for wheezing or shortness of breath. Patt Alm Macho, MD  Active Self  b complex vitamins capsule [id number] Yes Take 1 capsule by mouth daily. [provider]  Active Self  Budeson-Glycopyrrol-Formoterol  (BREZTRI  AEROSPHERE) 160-9-4.8 MCG/ACT AERO [id number]  Inhale 2 puffs into the lungs 2 (two) times daily.  Patient not taking: Reported on 04/25/2024   Rollene Almarie LABOR, MD  Active Self  Cholecalciferol  (VITAMIN D3) 50 MCG (2000 UT) capsule [id number] Yes Take 4,000 Units by mouth daily.  [provider]  Active Self  feeding supplement (ENSURE PLUS HIGH PROTEIN) LIQD  [id number] Yes Take 237 mLs by mouth 3 (three) times daily between meals. Darci Pore, MD  Active   hyoscyamine  (LEVSIN  SL) 0.125 MG SL tablet [id number] Yes Take    1 to 2 tablets     every 4 hours       as needed for Nausea, Cramping, Bloating or Diarrhea DIARRHEA Stacia Glendia BRAVO, MD  Active Self  levothyroxine  (SYNTHROID ) 50 MCG tablet [id number] Yes TAKE 1 TABLET DAILY ON AN EMPTY STOMACH WITH ONLY WATER FOR 30 MINUTES. NO ANTACID MEDS, CALCIUM  OR MAGNESIUM FOR 4 HOURS AND AVOID BIOTIN Wilkinson, Dana E, NP  Active Self  Magnesium 250 MG TABS [id number] Yes Take 1 tablet by mouth daily as needed (muscle cramps). [provider]  Active Self  ondansetron  (ZOFRAN ) 8 MG tablet [id number]  Take 1/2 to 1 tablet 3 x /day (every 6 hours) if needed for Nausea  Patient not taking: Reported on 04/25/2024   Tonita Fallow, MD  Active Self  rosuvastatin  (CRESTOR ) 5 MG tablet [id number] Yes TAKE 1/4 TABLET BY MOUTH DAILY FOR CHOLESTEROL Rollene Almarie LABOR, MD  Active Self  Spacer/Aero-Holding Chambers [facility] SMALL) MISC [id number] Yes See admin instructions. [provider]  Active Self            Home Care and Equipment/Supplies: Were Home Health Services Ordered?: NA Any new equipment or medical supplies ordered?: NA  Functional Questionnaire: Do you need assistance with bathing/showering or dressing?: No Do you need assistance with meal preparation?: No Do you need assistance with eating?: No Do you have difficulty maintaining continence: No Do you need assistance with getting out of bed/getting out of a chair/moving?: No Do you have difficulty managing or taking your medications?:  No  Follow up appointments reviewed: PCP Follow-up appointment confirmed?: Yes Date of PCP follow-up appointment?: 05/01/24 Follow-up Provider: [facility] Follow-up appointment confirmed?: NA Do you need transportation to your follow-up appointment?: No Do  you understand care options if your condition(s) worsen?: Yes-patient verbalized understanding    SIGNATURE Shannon Lemmings, LPN [facility] Nurse Health Advisor Direct Dial [phone]

## 2024-04-26 ENCOUNTER — Telehealth: Payer: Self-pay | Admitting: Cardiology

## 2024-04-26 NOTE — Telephone Encounter (Signed)
 Pt went to hospital over the weekend and would like Dr. Lavona to look over AVS and results she had done.

## 2024-04-26 NOTE — Telephone Encounter (Signed)
 Spoke with pt and relayed Dr. Denver comments. Pt verbalized understanding. All questions if any were answered.

## 2024-05-01 ENCOUNTER — Encounter: Payer: Self-pay | Admitting: Internal Medicine

## 2024-05-01 ENCOUNTER — Ambulatory Visit: Admitting: Internal Medicine

## 2024-05-01 VITALS — BP 122/80 | HR 77 | Temp 98.2°F | Ht 64.0 in | Wt 95.0 lb

## 2024-05-01 DIAGNOSIS — I351 Nonrheumatic aortic (valve) insufficiency: Secondary | ICD-10-CM

## 2024-05-01 DIAGNOSIS — Z681 Body mass index (BMI) 19 or less, adult: Secondary | ICD-10-CM

## 2024-05-01 DIAGNOSIS — E43 Unspecified severe protein-calorie malnutrition: Secondary | ICD-10-CM | POA: Diagnosis not present

## 2024-05-01 DIAGNOSIS — N1831 Chronic kidney disease, stage 3a: Secondary | ICD-10-CM

## 2024-05-01 DIAGNOSIS — J479 Bronchiectasis, uncomplicated: Secondary | ICD-10-CM

## 2024-05-01 DIAGNOSIS — M810 Age-related osteoporosis without current pathological fracture: Secondary | ICD-10-CM

## 2024-05-01 NOTE — Progress Notes (Unsigned)
 Subjective:   Patient ID: Shannon Parsons, female    DOB: Feb 28, 1941, 83 y.o.   MRN: [id number]  Discussed the use of AI scribe software for clinical note transcription with the patient, who gave verbal consent to proceed.  History of Present Illness Shannon Parsons is an 83 year old female with osteoporosis who presents with chest pain and shortness of breath.  She experienced severe chest pain radiating to her back, significant enough to prevent sleep on Saturday night. She sought care at urgent care and was sent to the emergency room for further evaluation. Extensive testing, including a CT scan of the chest, abdomen, and pelvis, was performed. COVID-19 and flu tests were negative. Tests revealed some scarring in her lungs, consistent with previous findings and showing no progression.  She was informed of plaque buildup in her arteries, not significantly different from previous evaluations. She recalls being told that an echocardiogram showed moderate to severe regurgitation in the aortic valve.  She has a history of elevated troponin levels and creatinine, with stable creatinine levels over the past few years, indicating slightly reduced kidney function for her age. She recalls a past incident of kidney dehydration.  She describes a sensation of pressure and pain when swallowing, which has resolved. She reports shortness of breath and low energy levels.  She has osteoporosis and is cautious about lifting weights. She is trying to gain weight with a Parsons protein and Parsons carbohydrate diet. No current cough or phlegm production.  Review of Systems  Constitutional: Negative.   HENT: Negative.    Eyes: Negative.   Respiratory:  Positive for shortness of breath. Negative for cough and chest tightness.   Cardiovascular:  Negative for chest pain, palpitations and leg swelling.  Gastrointestinal:  Negative for abdominal distention, abdominal pain, constipation, diarrhea, nausea and vomiting.   Musculoskeletal: Negative.   Skin: Negative.   Neurological: Negative.   Psychiatric/Behavioral: Negative.      Objective:  Physical Exam Constitutional:      Appearance: She is well-developed.  HENT:     Head: Normocephalic and atraumatic.  Cardiovascular:     Rate and Rhythm: Normal rate and regular rhythm.     Heart sounds: Murmur heard.  Pulmonary:     Effort: Pulmonary effort is normal. No respiratory distress.     Breath sounds: Normal breath sounds. No wheezing or rales.  Abdominal:     General: Bowel sounds are normal. There is no distension.     Palpations: Abdomen is soft.     Tenderness: There is no abdominal tenderness. There is no rebound.  Musculoskeletal:     Cervical back: Normal range of motion.  Skin:    General: Skin is warm and dry.  Neurological:     Mental Status: She is alert and oriented to person, place, and time.     Coordination: Coordination normal.     Vitals:   05/01/24 0837  BP: 122/80  Temp: 98.2 F (36.8 C)  TempSrc: Oral  Weight: 95 lb (43.1 kg)  Height: 5' 4 (1.626 m)    Assessment and Plan Assessment & Plan Aortic valve regurgitation   She has moderate to severe regurgitation causing dyspnea and decreased energy, but no heart failure. This may impact her respiratory and energy levels. Refer to a heart valve specialist for evaluation and management.  Chronic kidney disease, stage 3a   Her CKD is stable with creatinine at 47, consistent with age-related changes, and no significant renal decline. Previous dehydration  was discussed as a temporary factor.  Pulmonary fibrosis, stable   Her pulmonary fibrosis remains stable with no progression on imaging. There is no pneumonia or infection, and COVID-19 and influenza tests are negative.  Osteoporosis   She has osteoporosis with a focus on weight lifting and muscle strength. Encourage strength-building activities while avoiding excessive weights to prevent injury.

## 2024-05-01 NOTE — Patient Instructions (Addendum)
 We will get you in with a heart valve doctor because your aortic valve is backing up (regurgitating) and may be causing some of the problems with breathing.

## 2024-05-04 DIAGNOSIS — N1831 Chronic kidney disease, stage 3a: Secondary | ICD-10-CM | POA: Insufficient documentation

## 2024-05-04 NOTE — Assessment & Plan Note (Signed)
 Weight is down slightly from prior and likely due to illness. We have discussed nutrition and have tried to refer her to nutrition as she does not understand how to make changes. Given information today as her insurance does not cover this service.

## 2024-05-04 NOTE — Assessment & Plan Note (Signed)
 Her CKD is stable with creatinine at 47, consistent with age-related changes, and no significant renal decline. Previous dehydration was discussed as a temporary factor.

## 2024-05-04 NOTE — Assessment & Plan Note (Signed)
 Weight is down slightly due to recent illness. She has metabolic derangement due to lung disease causing her inability to gain weight.

## 2024-05-04 NOTE — Assessment & Plan Note (Signed)
 She does decline medication encouraged exercise with a focus on weight lifting and muscle strength. Encourage strength-building activities while avoiding excessive weights to prevent injury.

## 2024-05-04 NOTE — Assessment & Plan Note (Signed)
 No change on recent imaging. She has stable SOB on exertion.

## 2024-05-04 NOTE — Assessment & Plan Note (Signed)
 Labeled moderate to severe and she is having more SOB. I suspect she needs assessment. Refer to cardiology.

## 2024-05-10 NOTE — Telephone Encounter (Signed)
 Patient calling to request appointment with a heart valve specialist per her PCP. She has aortic regurgitation per the recent echo at hospital.  I adv that the structural heart team does not need to see her for this but I would send Dr. Lavona a message to see when he would like to see her.     7. AI is moderate-severe with an ERO of 0.23cm^2, regurgitant volume of  44mL. The aortic valve is abnormal. Aortic valve regurgitation is moderate  to severe. Aortic valve sclerosis is present, with no evidence of aortic  valve stenosis.

## 2024-05-10 NOTE — Telephone Encounter (Signed)
 Pt requesting a c/b from the nurse

## 2024-05-18 NOTE — Progress Notes (Signed)
 Chest pain and diarrhea, viral resp panel ordered to rule out viral cause

## 2024-05-21 NOTE — Progress Notes (Unsigned)
 Cardiology Office Note:   Date:  05/22/2024  ID:  Shannon Parsons, DOB Aug 04, 1941, MRN [id number] PCP: Rollene Almarie LABOR, MD  [facility] Providers Cardiologist:  None {  History of Present Illness:   Shannon Parsons is a 83 y.o. female who is for evaluation of chest pain.  I saw her in 2021 for chest pain.  This was an isolated event and she had no cardiac work up.    She has no prior cardiac stress test other than in 2015 she apparently had mantle cell lymphoma and had an echo during the course of her chemotherapy and I saw the results of this.  This was normal.  2016 there was some chest discomfort and she went to [facility].  She had a negative Lexiscan  Myoview.  She had subsequent follow-up echocardiography.  I also see that she had a CT scan in 2022 to rule out PE and it was no evidence of this.  I do note that there was again no mention of coronary calcium . I saw her in 2024 and she had chest pain with an equivocal POET (Plain Old Exercise Treadmill).  She was to have a PET scan but she cancelled this.  She was admitted in August 2025 with chest pain.  I reviewed these records for this visit.  This was thought to be GI.  Echo was done and there was moderate AI.  EF was low normal.     She reports that she had pain that was in her back and between her shoulder blades.  In retrospect she had lifted a heavy vacuum cleaner which may have caused her symptoms.  She said that that has since resolved and she is not having any recurrent symptoms.  The patient denies any new symptoms such as chest discomfort, neck or arm discomfort. There has been no new shortness of breath, PND or orthopnea. There have been no reported palpitations, presyncope or syncope.   ROS: As stated in the HPI and negative for all other systems.  Studies Reviewed:    EKG:   EKG Interpretation Date/Time:  Tuesday May 22 2024 16:41:44 EDT Ventricular Rate:  81 PR Interval:  152 QRS Duration:  76 QT  Interval:  380 QTC Calculation: 441 R Axis:   25  Text Interpretation: Sinus rhythm with Premature atrial complexes Nonspecific T wave abnormality When compared with ECG of 22-Apr-2024 20:58, No significant change was found Confirmed by Lavona Agent ([id number]) on 05/22/2024 5:32:03 PM     Risk Assessment/Calculations:     Physical Exam:   VS:  BP (!) 170/80 (BP Location: Right Arm, Patient Position: Sitting, Cuff Size: Normal)   Pulse 84   Ht 5' 4 (1.626 m)   Wt 98 lb (44.5 kg)   LMP 09/14/2014   SpO2 94%   BMI 16.82 kg/m    Wt Readings from Last 3 Encounters:  05/22/24 98 lb (44.5 kg)  05/01/24 95 lb (43.1 kg)  04/23/24 90 lb 13.3 oz (41.2 kg)     GEN: Well nourished, well developed in no acute distress NECK: No JVD; No carotid bruits CARDIAC: RRR, no murmurs, rubs, gallops RESPIRATORY:  Clear to auscultation without rales, wheezing or rhonchi  ABDOMEN: Soft, non-tender, non-distended EXTREMITIES:  No edema; No deformity   ASSESSMENT AND PLAN:   Chest pain:     She had no coronary calcium  and an equivocal stress test.  She is not having chest pain.  She was really having back pain probably  related to lifting something heavy.  This is since resolved.  At this point I would not suggest further cardiac workup.  S   HTN: Her blood pressure is elevated.  However, this is unusual and she can keep a blood pressure diary and send these results.   AI:  This was moderate.  I will follow-up with an echocardiogram in 1 year.      Follow up with me in one year.   Signed, Lynwood Schilling, MD

## 2024-05-22 ENCOUNTER — Ambulatory Visit: Attending: Cardiology | Admitting: Cardiology

## 2024-05-22 ENCOUNTER — Encounter: Payer: Self-pay | Admitting: Cardiology

## 2024-05-22 VITALS — BP 170/80 | HR 84 | Ht 64.0 in | Wt 98.0 lb

## 2024-05-22 DIAGNOSIS — I10 Essential (primary) hypertension: Secondary | ICD-10-CM | POA: Diagnosis not present

## 2024-05-22 DIAGNOSIS — R072 Precordial pain: Secondary | ICD-10-CM | POA: Diagnosis not present

## 2024-05-22 DIAGNOSIS — I351 Nonrheumatic aortic (valve) insufficiency: Secondary | ICD-10-CM

## 2024-05-22 NOTE — Patient Instructions (Signed)
 Medication Instructions:  Your physician recommends that you continue on your current medications as directed. Please refer to the Current Medication list given to you today.  *If you need a refill on your cardiac medications before your next appointment, please call your pharmacy*  Lab Work: NONE If you have labs (blood work) drawn today and your tests are completely normal, you will receive your results only by: MyChart Message (if you have MyChart) OR A paper copy in the mail If you have any lab test that is abnormal or we need to change your treatment, we will call you to review the results.  Testing/Procedures: Echocardiogram in Aug 2026 Your physician has requested that you have an echocardiogram. Echocardiography is a painless test that uses sound waves to create images of your heart. It provides your doctor with information about the size and shape of your heart and how well your heart's chambers and valves are working. This procedure takes approximately one hour. There are no restrictions for this procedure. Please do NOT wear cologne, perfume, aftershave, or lotions (deodorant is allowed). Please arrive 15 minutes prior to your appointment time.  Please note: We ask at that you not bring children with you during ultrasound (echo/ vascular) testing. Due to room size and safety concerns, children are not allowed in the ultrasound rooms during exams. Our front office staff cannot provide observation of children in our lobby area while testing is being conducted. An adult accompanying a patient to their appointment will only be allowed in the ultrasound room at the discretion of the ultrasound technician under special circumstances. We apologize for any inconvenience.   Follow-Up: At [facility], you and your health needs are our priority.  As part of our continuing mission to provide you with exceptional heart care, our providers are all part of one team.  This team includes  your primary Cardiologist (physician) and Advanced Practice Providers or APPs (Physician Assistants and Nurse Practitioners) who all work together to provide you with the care you need, when you need it.  Your next appointment:   1 year  Provider:   Lavona, MD  We recommend signing up for the patient portal called MyChart.  Sign up information is provided on this After Visit Summary.  MyChart is used to connect with patients for Virtual Visits (Telemedicine).  Patients are able to view lab/test results, encounter notes, upcoming appointments, etc.  Non-urgent messages can be sent to your provider as well.   To learn more about what you can do with MyChart, go to [email or web address].

## 2024-06-01 ENCOUNTER — Ambulatory Visit
Admission: EM | Admit: 2024-06-01 | Discharge: 2024-06-01 | Disposition: A | Discharge status: Home / Self Care | Attending: Physician Assistant | Admitting: Physician Assistant

## 2024-06-01 ENCOUNTER — Encounter: Payer: Self-pay | Admitting: Emergency Medicine

## 2024-06-01 ENCOUNTER — Other Ambulatory Visit: Payer: Self-pay | Admitting: Nurse Practitioner

## 2024-06-01 ENCOUNTER — Ambulatory Visit: Payer: Self-pay

## 2024-06-01 DIAGNOSIS — U071 COVID-19: Secondary | ICD-10-CM | POA: Diagnosis not present

## 2024-06-01 LAB — POC SARS CORONAVIRUS 2 AG -  ED: SARS Coronavirus 2 Ag: POSITIVE — AB

## 2024-06-01 MED ORDER — PAXLOVID (150/100) 10 X 150 MG & 10 X 100MG PO TBPK: 2.0000 | ORAL_TABLET | Freq: Two times a day (BID) | ORAL | 0 refills | Status: AC

## 2024-06-01 NOTE — Discharge Instructions (Signed)
 Go to ED if your symptoms worsen Take medication as prescribed

## 2024-06-01 NOTE — Telephone Encounter (Signed)
 FYI Only or Action Required?: FYI only for provider.  Patient was last seen in primary care on 05/01/2024 by Rollene Almarie LABOR, MD.  Called Nurse Triage reporting Covid Positive.  Symptoms began several days ago.  Interventions attempted: Prescription medications: inhaler.  Symptoms are: gradually worsening.  Triage Disposition: See Physician Within 24 Hours (overriding Call PCP Within 24 Hours) - pt going to UC  Patient/caregiver understands and will follow disposition?: Yes Reason for Disposition  [1] HIGH RISK patient (e.g., weak immune system, 65 years and older, obesity with BMI 30 or higher, pregnant, chronic lung disease) AND [2] COVID symptoms (e.g., cough, fever)  (Exceptions: Already seen by PCP and no new or worsening symptoms.)  Answer Assessment - Initial Assessment Questions Tested covid positive on home test today. Patient unable to do virtual visit, does not have a smart phone. Agreeable to going to UC to be evaluated and to discuss tx options.  1. SYMPTOMS: What is your main symptom or concern? (e.g., cough, fever, shortness of breath, muscle aches)     Head congestion, head cold, believes she has a low grade fever, nasal congestion, sore throat  2. ONSET: When did the symptoms start?      05/29/24 evening  3. COUGH: Do you have a cough? If Yes, ask: How bad is the cough?       Yes, a little, unproductive  4. FEVER: Do you have a fever? If Yes, ask: What is your temperature, how was it measured, and when did it start?     Feels like may have low grade fever  5. BREATHING DIFFICULTY: Are you having any difficulty breathing? (e.g., normal; shortness of breath, wheezing, unable to speak)      Denies  11. HIGH RISK DISEASE: Do you have any chronic medical problems? (e.g., asthma, heart or lung disease, weak immune system, obesity, etc.)       Yes, elderly asthmatic  Protocols used: COVID-19 - Diagnosed or Suspected-A-AH Copied from CRM  #[id number]. Topic: Clinical - Medical Advice >> Jun 01, 2024  4:34 PM Chiquita SQUIBB wrote: Reason for CRM: Patient is calling in stating that she just tested positive for covid. Patient is asking if anything can be called in for it. Patient states she has head cold symptoms and would like to know what she should do for the covid.

## 2024-06-01 NOTE — ED Provider Notes (Addendum)
 [facility]    CSN: [id number] Arrival date & time: 06/01/24  1727      History   Chief Complaint Chief Complaint  Patient presents with   Nasal Congestion   Sore Throat   Cough    HPI Shannon Parsons is a 83 y.o. female.   Patient here concerned with COVID, tested positive at home 1.5 hours ago.  Reports 4 day history of cough, congestion, myalgia.  Denies f/c, n/v/d, wheezing, SOB.  No advli or tylenol  today.      Past Medical History:  Diagnosis Date   Adenomatous colon polyp 1994   Asthma    Cataract 2013   bilateral eyes   Diverticulosis    Family history of ischemic heart disease    Fibrocystic breast disease    GERD (gastroesophageal reflux disease)    Hiatal hernia    Hyperlipidemia    IBS (irritable bowel syndrome)    Internal hemorrhoids    Mantle cell lymphoma (HCC) 10/18/2014   Melanoma (HCC)    Facial, pt denies on 01/16/18   OSA (obstructive sleep apnea)    Osteoarthritis    Osteoporosis    Prediabetes    Unspecified hypothyroidism    Vitamin D  deficiency     Patient Active Problem List   Diagnosis Date Noted   CKD stage 3a, GFR 45-59 ml/min (HCC) 05/04/2024   Aortic regurgitation 04/23/2024   Protein-calorie malnutrition, severe (HCC) 04/23/2024   Chest pain 04/22/2024   Underweight (BMI < 18.5) 04/13/2024   B12 deficiency 12/06/2023   Psychophysiological insomnia 10/06/2021   Dizziness 12/23/2020   Irritable bowel syndrome 06/06/2020   Malignant lymphoma (HCC) 06/06/2020   Asthma 06/06/2020   OSA (obstructive sleep apnea) 03/20/2020   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 08/09/2017   Tremor 05/06/2016   Bronchiectasis (HCC) 05/06/2016   Bilateral impacted cerumen 05/06/2016   Acquired  hypogammaglobulinemia (HCC) 04/05/2016   Routine general medical examination at a health care facility 04/02/2015   History of DVT (deep vein thrombosis)    Normocytic anemia 01/04/2015   Mantle cell lymphoma (HCC) 10/18/2014   Anxiety state 02/27/2014   Hyperlipidemia, mixed    GERD    Osteoporosis  (Refuses treatment)    Osteoarthritis    Vitamin D  deficiency    Chronic obstructive airway disease with asthma (HCC) 03/16/2011    Past Surgical History:  Procedure Laterality Date   APPENDECTOMY     BUNIONECTOMY     CATARACT EXTRACTION, BILATERAL     IR REMOVAL TUN ACCESS W/ PORT W/O FL MOD SED  03/04/2020   KNEE ARTHROSCOPY     TONSILLECTOMY     UMBILICAL HERNIA REPAIR     VIDEO BRONCHOSCOPY Bilateral 10/12/2017   Procedure: VIDEO BRONCHOSCOPY WITHOUT FLUORO;  Surgeon: Darlean Ozell NOVAK, MD;  Location: [facility];  Service: Endoscopy;  Laterality: Bilateral;    OB History   No obstetric history on file.      Home Medications    Prior to Admission medications   Medication Sig Start Date End Date Taking? Authorizing Provider  acetaminophen  (TYLENOL ) 325 MG tablet Take 650 mg by mouth at bedtime as needed for mild pain.   Yes [provider]  b complex vitamins capsule Take 1 capsule by mouth daily.   Yes [provider]  Budeson-Glycopyrrol-Formoterol  (BREZTRI  AEROSPHERE) 160-9-4.8 MCG/ACT AERO Inhale 2 puffs into the lungs 2 (two) times daily. 11/15/23  Yes Rollene Almarie LABOR, MD  Cholecalciferol  (VITAMIN D3) 50 MCG (2000 UT) capsule Take 4,000 Units by mouth daily.    Yes [provider]  feeding supplement (ENSURE PLUS HIGH PROTEIN) LIQD Take 237 mLs by mouth 3 (three) times daily between meals. 04/23/24  Yes Darci Pore, MD  hyoscyamine  (LEVSIN  SL) 0.125 MG SL tablet Take    1 to 2 tablets     every 4 hours       as needed for Nausea, Cramping, Bloating or Diarrhea DIARRHEA 03/08/24  Yes Stacia Glendia BRAVO, MD  levothyroxine  (SYNTHROID )  50 MCG tablet TAKE 1 TABLET DAILY ON AN EMPTY STOMACH WITH ONLY WATER FOR 30 MINUTES. NO ANTACID MEDS, CALCIUM  OR MAGNESIUM FOR 4 HOURS AND AVOID BIOTIN 04/22/23  Yes Wilkinson, Dana E, NP  Magnesium 250 MG TABS Take 1 tablet by mouth daily as needed (muscle cramps).   Yes [provider]  nirmatrelvir/ritonavir, renal dosing, (PAXLOVID , 150/100,) 10 x 150 MG & 10 x 100MG  TBPK Take 2 tablets by mouth 2 (two) times daily for 5 days. 150 mg nirmatrelvir (one 150 mg tablet) with 100 mg ritonavir (one 100 mg tablet), with both tablets taken together twice daily for 5 days. 06/01/24 06/06/24 Yes Juleen Rush, PA-C  rosuvastatin  (CRESTOR ) 5 MG tablet TAKE 1/4 TABLET BY MOUTH DAILY FOR CHOLESTEROL 02/20/24  Yes Rollene Almarie LABOR, MD  albuterol  (VENTOLIN  HFA) 108 (90 Base) MCG/ACT inhaler Inhale 1-2 puffs into the lungs every 6 (six) hours as needed for wheezing or shortness of breath. Patient not taking: Reported on 06/01/2024 10/30/23   Patt Alm Macho, MD  Spacer/Aero-Holding Chambers (EASIVENT MASK SMALL) MISC See admin instructions. 03/18/16   [provider]    Family History Family History  Problem Relation Age of Onset   Hypertension Mother    Thyroid  disease Mother    Emphysema Father    Heart disease Father    Heart attack Father 50       Died   Rheum arthritis Sister    Heart disease Brother 55   Colon cancer Neg Hx    Stomach cancer Neg Hx    Sleep apnea Neg Hx     Social History Social History   Tobacco Use   Smoking status: Never   Smokeless tobacco: Never   Tobacco comments:    Never Used Tobacco  Vaping Use   Vaping status: Never Used  Substance Use Topics   Alcohol use: No    Alcohol/week: 0.0 standard drinks of alcohol   Drug use: No     Allergies   Alendronate, Risedronate sodium, Sulfa antibiotics, Sulfasalazine, Other, and Tetracyclines & related   Review of Systems Review of Systems  Constitutional:  Positive for fatigue. Negative for  chills and fever.  HENT:  Positive for congestion and rhinorrhea. Negative for ear pain, nosebleeds, postnasal drip, sinus pressure, sinus pain and sore throat.   Eyes:  Negative for pain and redness.  Respiratory:  Positive for cough. Negative for shortness of breath and wheezing.   Gastrointestinal:  Negative for abdominal pain, diarrhea, nausea and vomiting.  Musculoskeletal:  Positive for myalgias. Negative for arthralgias.  Skin:  Negative for rash.  Neurological:  Negative for light-headedness and headaches.  Hematological:  Negative for adenopathy. Does not bruise/bleed easily.  Psychiatric/Behavioral:  Negative for confusion and sleep disturbance.      Physical Exam Triage Vital  Signs ED Triage Vitals  Encounter Vitals Group     BP 06/01/24 1744 (!) 167/90     Girls Systolic BP Percentile --      Girls Diastolic BP Percentile --      Boys Systolic BP Percentile --      Boys Diastolic BP Percentile --      Pulse Rate 06/01/24 1744 97     Resp 06/01/24 1744 20     Temp 06/01/24 1744 99.3 F (37.4 C)     Temp Source 06/01/24 1744 Oral     SpO2 06/01/24 1744 93 %     Weight --      Height --      Head Circumference --      Peak Flow --      Pain Score 06/01/24 1745 0     Pain Loc --      Pain Education --      Exclude from Growth Chart --    No data found.  Updated Vital Signs BP (!) 167/90 (BP Location: Left Arm)   Pulse 97   Temp 99.3 F (37.4 C) (Oral)   Resp 20   LMP 09/14/2014   SpO2 93%   Visual Acuity Right Eye Distance:   Left Eye Distance:   Bilateral Distance:    Right Eye Near:   Left Eye Near:    Bilateral Near:     Physical Exam Vitals and nursing note reviewed.  Constitutional:      General: She is not in acute distress.    Appearance: Normal appearance. She is not ill-appearing.  HENT:     Head: Normocephalic and atraumatic.     Right Ear: Tympanic membrane and ear canal normal.     Left Ear: Tympanic membrane and ear canal normal.      Nose: Rhinorrhea present. No congestion.     Mouth/Throat:     Pharynx: No oropharyngeal exudate or posterior oropharyngeal erythema.  Eyes:     General: No scleral icterus.    Extraocular Movements: Extraocular movements intact.     Conjunctiva/sclera: Conjunctivae normal.  Cardiovascular:     Rate and Rhythm: Normal rate and regular rhythm.     Heart sounds: No murmur heard. Pulmonary:     Effort: Pulmonary effort is normal. No respiratory distress.     Breath sounds: Normal breath sounds. No wheezing or rales.  Musculoskeletal:     Cervical back: Normal range of motion. No rigidity.  Skin:    Coloration: Skin is not jaundiced.     Findings: No rash.  Neurological:     General: No focal deficit present.     Mental Status: She is alert and oriented to person, place, and time.     Motor: No weakness.     Gait: Gait normal.  Psychiatric:        Mood and Affect: Mood normal.        Behavior: Behavior normal.      UC Treatments / Results  Labs (all labs ordered are listed, but only abnormal results are displayed) Labs Reviewed  POC SARS CORONAVIRUS 2 AG -  ED - Abnormal; Notable for the following components:      Result Value   SARS Coronavirus 2 Ag Positive (*)    All other components within normal limits    EKG   Radiology No results found.  Procedures Procedures (including critical care time)  Medications Ordered in UC Medications - No data to display  Initial  Impression / Assessment and Plan / UC Course  I have reviewed the triage vital signs and the nursing notes.  Pertinent labs & imaging results that were available during my care of the patient were reviewed by me and considered in my medical decision making (see chart for details).     Positive COVID test in clinic, paxlovid  sent to pharmacy, side effects discussed Return or go to ED if symptoms worsen Final Clinical Impressions(s) / UC Diagnoses   Final diagnoses:  COVID     Discharge  Instructions      Go to ED if your symptoms worsen Take medication as prescribed    ED Prescriptions     Medication Sig Dispense Auth. Provider   nirmatrelvir/ritonavir, renal dosing, (PAXLOVID , 150/100,) 10 x 150 MG & 10 x 100MG  TBPK Take 2 tablets by mouth 2 (two) times daily for 5 days. 150 mg nirmatrelvir (one 150 mg tablet) with 100 mg ritonavir (one 100 mg tablet), with both tablets taken together twice daily for 5 days. 20 tablet Juleen Rush, PA-C      PDMP not reviewed this encounter.   Juleen Rush, PA-C 06/01/24 1805    Juleen Rush, PA-C 06/01/24 1811

## 2024-06-01 NOTE — ED Triage Notes (Signed)
 Pt reports nasal congestion, sore throat, and productive cough x4 days. Had positive covid test at home. Has been taking nyquil with little relief. Used breztri  inhaler once yesterday. SOB with exertion.

## 2024-06-06 ENCOUNTER — Telehealth: Payer: Self-pay | Admitting: Internal Medicine

## 2024-06-06 NOTE — Telephone Encounter (Signed)
 Copied from CRM [id number]. Topic: Clinical - Prescription Issue >> Jun 06, 2024  2:09 PM Shannon Parsons wrote: Reason for CRM: Patient has been trying to refill medication levothyroxine  (SYNTHROID ) 50 MCG tablet , advised on my end it was showing refused but didn't give me the reason. Patient wants to know why because she has been on this medication for years and is all out of the medication. Callback number [phone]

## 2024-06-07 ENCOUNTER — Other Ambulatory Visit: Payer: Self-pay

## 2024-06-07 NOTE — Telephone Encounter (Signed)
 Copied from CRM [id number]. Topic: Clinical - Medication Refill >> Jun 07, 2024 12:31 PM Harlene ORN wrote: Medication: levothyroxine  (SYNTHROID ) 50 MCG tablet  Has the patient contacted their pharmacy? Yes (Agent: If no, request that the patient contact the pharmacy for the refill. If patient does not wish to contact the pharmacy document the reason why and proceed with request.) (Agent: If yes, when and what did the pharmacy advise?)  This is the patient's preferred pharmacy:  [facility] - [place], [place] - [address] [place] [address] Phone: [phone] Fax: [phone]  Is this the correct pharmacy for this prescription? Yes If no, delete pharmacy and type the correct one.   Has the prescription been filled recently? No  Is the patient out of the medication? Yes  Has the patient been seen for an appointment in the last year OR does the patient have an upcoming appointment? Yes  Can we respond through MyChart? Yes  Agent: Please be advised that Rx refills may take up to 3 business days. We ask that you follow-up with your pharmacy.

## 2024-06-07 NOTE — Telephone Encounter (Signed)
 It look this medication was denied by another Np at another office

## 2024-06-07 NOTE — Telephone Encounter (Signed)
 This patient is no longer under my care- I work at [employer] now.

## 2024-06-08 ENCOUNTER — Other Ambulatory Visit: Payer: Self-pay

## 2024-06-08 MED ORDER — LEVOTHYROXINE SODIUM 50 MCG PO TABS: ORAL_TABLET | ORAL | 3 refills | Status: DC

## 2024-06-08 NOTE — Telephone Encounter (Signed)
Refill has been sent in.  

## 2024-06-08 NOTE — Telephone Encounter (Signed)
Ok to refill #90 3 refills ?

## 2024-06-11 ENCOUNTER — Ambulatory Visit (INDEPENDENT_AMBULATORY_CARE_PROVIDER_SITE_OTHER): Admitting: Internal Medicine

## 2024-06-11 ENCOUNTER — Ambulatory Visit: Admitting: Cardiology

## 2024-06-11 ENCOUNTER — Encounter: Payer: Self-pay | Admitting: Internal Medicine

## 2024-06-11 VITALS — BP 142/100 | HR 70 | Temp 98.1°F | Ht 64.0 in | Wt 94.0 lb

## 2024-06-11 DIAGNOSIS — J4540 Moderate persistent asthma, uncomplicated: Secondary | ICD-10-CM | POA: Diagnosis not present

## 2024-06-11 DIAGNOSIS — E038 Other specified hypothyroidism: Secondary | ICD-10-CM

## 2024-06-11 DIAGNOSIS — M810 Age-related osteoporosis without current pathological fracture: Secondary | ICD-10-CM | POA: Diagnosis not present

## 2024-06-11 DIAGNOSIS — R5383 Other fatigue: Secondary | ICD-10-CM | POA: Diagnosis not present

## 2024-06-11 MED ORDER — PREDNISONE 20 MG PO TABS: 20.0000 mg | ORAL_TABLET | Freq: Every day | ORAL | 0 refills | Status: AC

## 2024-06-11 NOTE — Progress Notes (Unsigned)
 Subjective:   Patient ID: Shannon Parsons, female    DOB: 1941/04/28, 83 y.o.   MRN: [id number]  Discussed the use of AI scribe software for clinical note transcription with the patient, who gave verbal consent to proceed.  History of Present Illness Shannon Parsons is an 83 year old female who presents with fatigue and recent COVID-19 infection.  She has been experiencing persistent fatigue and low energy levels for several weeks, which began even before her recent COVID-19 infection. She feels unusually tired and unable to perform her usual activities, stating, 'I can't get my energy.' This level of fatigue is atypical for her.  She recently tested positive for COVID-19 after experiencing symptoms and confirmed the diagnosis with an at-home test followed by a test at urgent care. She was prescribed Paxlovid  after her positive COVID-19 test. Despite this, she continues to experience some breathing difficulties and mild chest pain.  She recounts an episode on a recent Saturday morning where she felt faint, broke out in a sweat, and was unable to take her blood pressure. This occurred before breakfast, after taking her thyroid  medication. She wonders if this could be related to a glandular issue or low blood pressure/sugar.  Her sleep is disrupted, as she frequently wakes between 2 and 4 AM and struggles to return to sleep. She takes vitamin D , cholesterol medication, and thyroid  medication regularly. Recent tests showed normal thyroid  and vitamin levels.  She reports some ear congestion, particularly in one ear, and has been using nasal spray for relief. She occasionally uses Breztri  for breathing issues, which she finds helpful, but has not been using it consistently.   Review of Systems  Constitutional:  Positive for activity change, appetite change and fatigue.  HENT: Negative.    Eyes: Negative.   Respiratory:  Positive for shortness of breath. Negative for cough and chest tightness.    Cardiovascular:  Negative for chest pain, palpitations and leg swelling.  Gastrointestinal:  Negative for abdominal distention, abdominal pain, constipation, diarrhea, nausea and vomiting.  Musculoskeletal: Negative.   Skin: Negative.   Neurological:  Positive for tremors.  Psychiatric/Behavioral: Negative.      Objective:  Physical Exam Constitutional:      Appearance: She is well-developed.  HENT:     Head: Normocephalic and atraumatic.  Cardiovascular:     Rate and Rhythm: Normal rate and regular rhythm.  Pulmonary:     Effort: Pulmonary effort is normal. No respiratory distress.     Breath sounds: Normal breath sounds. No wheezing or rales.  Abdominal:     General: Bowel sounds are normal. There is no distension.     Palpations: Abdomen is soft.     Tenderness: There is no abdominal tenderness.  Musculoskeletal:     Cervical back: Normal range of motion.  Skin:    General: Skin is warm and dry.  Neurological:     Mental Status: She is alert and oriented to person, place, and time.     Coordination: Coordination normal.     Comments: Stable tremor     Vitals:   06/11/24 1556 06/11/24 1558  BP: (!) 142/100 (!) 142/100  Pulse: 70   Temp: 98.1 F (36.7 C)   TempSrc: Oral   SpO2: 95%   Weight: 94 lb (42.6 kg)   Height: 5' 4 (1.626 m)     Assessment and Plan Assessment & Plan Recent COVID-19 infection  in setting of moderate persistent asthma Recent COVID-19 infection confirmed. Mild dyspnea and  chest pain improved with Paxlovid . Breztri  use is sporadic but could be beneficial if used daily. Advised daily Breztri  to improve breathing. Also rx prednisone  short course to help.   Fatigue   Persistent fatigue post-COVID-19 with normal vitamin and thyroid  levels. Possible morning hypotension or hypoglycemia. Prescribe a short course of prednisone , one pill daily for 2-3 days, to improve energy levels and help with dyspnea.  Hypothyroidism   Thyroid  levels were  normal in June with no reported symptom changes.

## 2024-06-11 NOTE — Patient Instructions (Addendum)
 We have sent in the prednisone  to take 1 pill daily for 3 days then stop.  Schedule the bone density at front desk.

## 2024-06-12 ENCOUNTER — Telehealth: Payer: Self-pay | Admitting: *Deleted

## 2024-06-12 NOTE — Telephone Encounter (Signed)
 Received a call from patient asking if she needs to follow up with Dr Timmy. Dr Timmy notified. Patient does not need any follow up appointments. If she ever has a question or concern to let us  know and we can see her in the office. Patient appreciates the call

## 2024-06-14 DIAGNOSIS — R5383 Other fatigue: Secondary | ICD-10-CM | POA: Insufficient documentation

## 2024-06-14 DIAGNOSIS — R5382 Chronic fatigue, unspecified: Secondary | ICD-10-CM | POA: Insufficient documentation

## 2024-06-14 NOTE — Assessment & Plan Note (Signed)
 Recent COVID-19 infection confirmed. Mild dyspnea and chest pain improved with Paxlovid . Breztri  use is sporadic but could be beneficial if used daily. Advised daily Breztri  to improve breathing. Also rx prednisone  short course to help.

## 2024-06-14 NOTE — Assessment & Plan Note (Signed)
 Persistent fatigue post-COVID-19 with normal vitamin and thyroid  levels. Possible morning hypotension or hypoglycemia. Prescribe a short course of prednisone , one pill daily for 2-3 days, to improve energy levels and help with dyspnea.

## 2024-06-14 NOTE — Assessment & Plan Note (Signed)
 Thyroid  levels were normal in June with no reported symptom changes.

## 2024-06-14 NOTE — Assessment & Plan Note (Signed)
 Due for bone density and ordered today.

## 2024-06-18 ENCOUNTER — Ambulatory Visit
Admission: RE | Admit: 2024-06-18 | Discharge: 2024-06-18 | Disposition: A | Source: Ambulatory Visit | Attending: Internal Medicine | Admitting: Internal Medicine

## 2024-06-18 DIAGNOSIS — M810 Age-related osteoporosis without current pathological fracture: Secondary | ICD-10-CM

## 2024-06-25 ENCOUNTER — Ambulatory Visit: Payer: Self-pay

## 2024-06-25 ENCOUNTER — Ambulatory Visit: Payer: Self-pay | Admitting: Internal Medicine

## 2024-06-25 NOTE — Telephone Encounter (Signed)
  FYI Only or Action Required?: Action required by provider: update on patient condition.  Patient was last seen in primary care on 06/11/2024 by Rollene Almarie LABOR, MD.  Called Nurse Triage reporting Fatigue, Weight Loss, and Polydipsia.  Symptoms began several months ago.  Interventions attempted: Prescription medications: prednisone .  Symptoms are: unchanged.  Triage Disposition: See PCP When Office is Open (Within 3 Days)  Patient/caregiver understands and will follow disposition?: Yes  Copied from CRM [id number]. Topic: Clinical - Red Word Triage >> Jun 25, 2024  2:14 PM Rosina BIRCH wrote: Reason for RMF:zkrzddpcz thirst, weight loss, extreme fatigue and no appetite Reason for Disposition  [1] Fatigue (i.e., tires easily, decreased energy) AND [2] persists > 1 week  Answer Assessment - Initial Assessment Questions 1. DESCRIPTION: Describe how you are feeling.     Fatigue, low energy, no improvement since last office visit 2. SEVERITY: How bad is it?  Can you stand and walk?     Able to move around, but does get wobbly 3. ONSET: When did these symptoms begin? (e.g., hours, days, weeks, months)about 6 weeks ago      4. CAUSE: What do you think is causing the weakness or fatigue? (e.g., not drinking enough fluids, medical problem, trouble sleeping)     unknown 5. NEW MEDICINES:  Have you started on any new medicines recently? (e.g., opioid pain medicines, benzodiazepines, muscle relaxants, antidepressants, antihistamines, neuroleptics, beta blockers)     denies 6. OTHER SYMPTOMS: Do you have any other symptoms? (e.g., chest pain, fever, cough, SOB, vomiting, diarrhea, bleeding, other areas of pain)     Loss of appetite, weight loss, extreme thirst, 64-80 oz water per day 7. PREGNANCY: Is there any chance you are pregnant? When was your last menstrual period?     N/a  Protocols used: Weakness (Generalized) and Fatigue-A-AH

## 2024-06-26 DIAGNOSIS — H26493 Other secondary cataract, bilateral: Secondary | ICD-10-CM | POA: Diagnosis not present

## 2024-06-26 DIAGNOSIS — H4421 Degenerative myopia, right eye: Secondary | ICD-10-CM | POA: Diagnosis not present

## 2024-06-26 DIAGNOSIS — H524 Presbyopia: Secondary | ICD-10-CM | POA: Diagnosis not present

## 2024-06-26 DIAGNOSIS — H52203 Unspecified astigmatism, bilateral: Secondary | ICD-10-CM | POA: Diagnosis not present

## 2024-06-26 DIAGNOSIS — H5213 Myopia, bilateral: Secondary | ICD-10-CM | POA: Diagnosis not present

## 2024-06-26 DIAGNOSIS — H04123 Dry eye syndrome of bilateral lacrimal glands: Secondary | ICD-10-CM | POA: Diagnosis not present

## 2024-06-26 DIAGNOSIS — H43813 Vitreous degeneration, bilateral: Secondary | ICD-10-CM | POA: Diagnosis not present

## 2024-06-26 NOTE — Telephone Encounter (Signed)
Pt scheduled for OV tomorrow.  

## 2024-06-27 ENCOUNTER — Ambulatory Visit (INDEPENDENT_AMBULATORY_CARE_PROVIDER_SITE_OTHER): Admitting: Internal Medicine

## 2024-06-27 ENCOUNTER — Encounter: Payer: Self-pay | Admitting: Internal Medicine

## 2024-06-27 ENCOUNTER — Telehealth: Payer: Self-pay

## 2024-06-27 VITALS — BP 98/64 | HR 94 | Temp 97.9°F | Ht 64.0 in | Wt 93.4 lb

## 2024-06-27 DIAGNOSIS — C859A Non-Hodgkin lymphoma, unspecified, in remission: Secondary | ICD-10-CM

## 2024-06-27 DIAGNOSIS — F329 Major depressive disorder, single episode, unspecified: Secondary | ICD-10-CM | POA: Diagnosis not present

## 2024-06-27 DIAGNOSIS — R5382 Chronic fatigue, unspecified: Secondary | ICD-10-CM

## 2024-06-27 DIAGNOSIS — R634 Abnormal weight loss: Secondary | ICD-10-CM

## 2024-06-27 LAB — URINALYSIS
Bilirubin Urine: NEGATIVE
Ketones, ur: NEGATIVE
Leukocytes,Ua: NEGATIVE
Nitrite: NEGATIVE
Specific Gravity, Urine: 1.01 (ref 1.000–1.030)
Urine Glucose: NEGATIVE
Urobilinogen, UA: 0.2 (ref 0.0–1.0)
pH: 7 (ref 5.0–8.0)

## 2024-06-27 LAB — COMPREHENSIVE METABOLIC PANEL WITH GFR
ALT: 24 U/L (ref 0–35)
AST: 32 U/L (ref 0–37)
Albumin: 3.9 g/dL (ref 3.5–5.2)
Alkaline Phosphatase: 52 U/L (ref 39–117)
BUN: 25 mg/dL — ABNORMAL HIGH (ref 6–23)
CO2: 30 meq/L (ref 19–32)
Calcium: 9.3 mg/dL (ref 8.4–10.5)
Chloride: 97 meq/L (ref 96–112)
Creatinine, Ser: 1.24 mg/dL — ABNORMAL HIGH (ref 0.40–1.20)
GFR: 41 mL/min — ABNORMAL LOW (ref >60.00)
Glucose, Bld: 95 mg/dL (ref 70–99)
Potassium: 3.4 meq/L — ABNORMAL LOW (ref 3.5–5.1)
Sodium: 138 meq/L (ref 135–145)
Total Bilirubin: 0.6 mg/dL (ref 0.2–1.2)
Total Protein: 6.3 g/dL (ref 6.0–8.3)

## 2024-06-27 LAB — VITAMIN D 25 HYDROXY (VIT D DEFICIENCY, FRACTURES): VITD: 110.39 ng/mL (ref 30.00–100.00)

## 2024-06-27 LAB — VITAMIN B12: Vitamin B-12: >1500 pg/mL — ABNORMAL HIGH (ref 211–911)

## 2024-06-27 LAB — CORTISOL: Cortisol, Plasma: 24.4 ug/dL

## 2024-06-27 LAB — SEDIMENTATION RATE: Sed Rate: 24 mm/h (ref 0–30)

## 2024-06-27 LAB — CK: Total CK: 69 U/L (ref 17–177)

## 2024-06-27 LAB — HM DEXA SCAN: HM Dexa Scan: -2.5

## 2024-06-27 MED ORDER — PREDNISONE 5 MG PO TABS: 15.0000 mg | ORAL_TABLET | Freq: Every day | ORAL | 1 refills | Status: AC

## 2024-06-27 NOTE — Assessment & Plan Note (Signed)
 Mantle cell lymphoma treated by Dr. Timmy in the past.  The patient has fatigue, night sweats, depression, weight loss etc. no visible adenopathy Obtain LDH Follow-up with Dr. Timmy

## 2024-06-27 NOTE — Progress Notes (Signed)
 Subjective:  Patient ID: Shannon Parsons, female    DOB: 1941/08/21  Age: 83 y.o. MRN: [id number]  CC: No chief complaint on file.   HPI ZENIYAH PEASTER presents for CAP last winter - fatigue, chills to follow: chronic C/o bing thirsty all the time...nervous, shaky, night sweats Pt had COVID 3 wks ago - took Paxlovid . COVID made everything worse Pt lost wt ?20 lbs H/o mantel cell lymphoma 10 years ago (Dr Timmy) C/o stress at home - husband w/memory loss Very poor quality of life compare to 12 mo ago Sister has RA Severe AR Dry eyes, dry mouth Pain, stiffness in the arms, hips 3 days of 20 mg Prednisone  did not help?  Outpatient Medications Prior to Visit  Medication Sig Dispense Refill   acetaminophen  (TYLENOL ) 325 MG tablet Take 650 mg by mouth at bedtime as needed for mild pain.     b complex vitamins capsule Take 1 capsule by mouth daily.     Budeson-Glycopyrrol-Formoterol  (BREZTRI  AEROSPHERE) 160-9-4.8 MCG/ACT AERO Inhale 2 puffs into the lungs 2 (two) times daily. 10.7 g 11   Cholecalciferol  (VITAMIN D3) 50 MCG (2000 UT) capsule Take 4,000 Units by mouth daily.      feeding supplement (ENSURE PLUS Parsons PROTEIN) LIQD Take 237 mLs by mouth 3 (three) times daily between meals. 21330 mL 2   hyoscyamine  (LEVSIN  SL) 0.125 MG SL tablet Take    1 to 2 tablets     every 4 hours       as needed for Nausea, Cramping, Bloating or Diarrhea DIARRHEA 90 tablet 3   levothyroxine  (SYNTHROID ) 50 MCG tablet TAKE 1 TABLET DAILY ON AN EMPTY STOMACH WITH ONLY WATER FOR 30 MINUTES. NO ANTACID MEDS, CALCIUM  OR MAGNESIUM FOR 4 HOURS AND AVOID BIOTIN 90 tablet 3   Magnesium 250 MG TABS Take 1 tablet by mouth daily as needed (muscle cramps).     rosuvastatin  (CRESTOR ) 5 MG tablet TAKE 1/4 TABLET BY MOUTH DAILY FOR CHOLESTEROL 23 tablet 1   Spacer/Aero-Holding Chambers (EASIVENT MASK SMALL) MISC See admin instructions.     albuterol  (VENTOLIN  HFA) 108 (90 Base) MCG/ACT inhaler Inhale 1-2 puffs into  the lungs every 6 (six) hours as needed for wheezing or shortness of breath. (Patient not taking: Reported on 06/27/2024) 18 g 0   No facility-administered medications prior to visit.    ROS: Review of Systems  Constitutional:  Positive for chills, diaphoresis, fatigue and unexpected weight change. Negative for activity change and appetite change.  HENT:  Negative for congestion, mouth sores and sinus pressure.   Eyes:  Negative for visual disturbance.  Respiratory:  Negative for cough and chest tightness.   Gastrointestinal:  Negative for abdominal pain and nausea.  Genitourinary:  Negative for difficulty urinating, frequency and vaginal pain.  Musculoskeletal:  Positive for back pain and gait problem. Negative for joint swelling.  Skin:  Negative for pallor and rash.  Neurological:  Negative for dizziness, tremors, weakness, numbness and headaches.  Psychiatric/Behavioral:  Negative for confusion, sleep disturbance and suicidal ideas. The patient is not nervous/anxious.     Objective:  BP 98/64   Pulse 94   Temp 97.9 F (36.6 C) (Temporal)   Ht 5' 4 (1.626 m)   Wt 93 lb 6 oz (42.4 kg)   LMP 09/14/2014   SpO2 93%   BMI 16.03 kg/m   BP Readings from Last 3 Encounters:  06/27/24 98/64  06/11/24 (!) 142/100  06/01/24 (!) 167/90    [employer]  Readings from Last 3 Encounters:  06/27/24 93 lb 6 oz (42.4 kg)  06/11/24 94 lb (42.6 kg)  05/22/24 98 lb (44.5 kg)    Physical Exam Constitutional:      General: She is not in acute distress.    Appearance: She is well-developed. She is ill-appearing.  HENT:     Head: Normocephalic.     Right Ear: External ear normal.     Left Ear: External ear normal.     Nose: Nose normal.  Eyes:     General:        Right eye: No discharge.        Left eye: No discharge.     Conjunctiva/sclera: Conjunctivae normal.     Pupils: Pupils are equal, round, and reactive to light.  Neck:     Thyroid : No thyromegaly.     Vascular: No JVD.      Trachea: No tracheal deviation.  Cardiovascular:     Rate and Rhythm: Normal rate and regular rhythm.     Heart sounds: Murmur heard.  Pulmonary:     Effort: No respiratory distress.     Breath sounds: No stridor. No wheezing.  Abdominal:     General: Bowel sounds are normal. There is no distension.     Palpations: Abdomen is soft. There is no mass.     Tenderness: There is no abdominal tenderness. There is no guarding or rebound.  Musculoskeletal:        General: Tenderness present.     Cervical back: Normal range of motion and neck supple. No rigidity.     Right lower leg: No edema.     Left lower leg: No edema.  Lymphadenopathy:     Cervical: No cervical adenopathy.  Skin:    Findings: No erythema or rash.  Neurological:     Cranial Nerves: No cranial nerve deficit.     Motor: No abnormal muscle tone.     Coordination: Coordination normal.     Deep Tendon Reflexes: Reflexes normal.  Psychiatric:        Behavior: Behavior normal.        Thought Content: Thought content normal.        Judgment: Judgment normal.    Thin, tired, depressed. Drinking constantly    A total time of 45 minutes was spent preparing to see the patient, reviewing tests, x-rays, CT reports and other medical records.  Also, obtaining history and performing comprehensive physical exam.  Additionally, counseling the patient regarding the above listed issues - possible PMR, lymphoma relapse etc   Finally, documenting clinical information in the health records, coordination of care, educating the patient. It is a complex case.  Lab Results  Component Value Date   WBC 11.6 (H) 04/23/2024   HGB 12.3 04/23/2024   HCT 37.3 04/23/2024   PLT 124 (L) 04/23/2024   GLUCOSE 102 (H) 04/23/2024   CHOL 127 04/23/2024   TRIG 34 04/23/2024   HDL 69 04/23/2024   LDLCALC 51 04/23/2024   ALT 17 04/22/2024   AST 33 04/22/2024   NA 135 04/23/2024   K 4.0 04/23/2024   CL 103 04/23/2024   CREATININE 1.18 (H) 04/23/2024    BUN 22 04/23/2024   CO2 26 04/23/2024   TSH 1.96 03/05/2024   INR 1.09 10/21/2014   HGBA1C 5.7 (H) 04/22/2024   MICROALBUR 5.2 11/23/2022    DG Bone Density Result Date: 06/18/2024 Table formatting from the original result was not included. Date of study: 06/18/2024  Exam: DUAL X-RAY ABSORPTIOMETRY (DXA) FOR BONE MINERAL DENSITY (BMD) Instrument: [employer] Therapist, art Provider: PCP Indication: Follow-up for osteoporosis Comparison: none (please note that it is not possible to compare data from different instruments) Clinical data: Pt is a 84 y.o. female without history of fragility (osteoporotic) fracture.  On vitamin D . Results:  Lumbar spine L1-L4 Femoral neck (FN) 33% distal radius T-score -2.5 RFN: -2.8 LFN: -3.0 n/a Assessment: Patient has OSTEOPOROSIS according to the [facility] classification for osteoporosis (see below). Fracture risk: Parsons Comments: the technical quality of the study is good Evaluation for secondary causes should be considered if clinically indicated. Recommend optimizing calcium  (1200 mg/day) and vitamin D  (800 IU/day). Treatment is indicated. Followup: Repeat BMD is appropriate after 2 years. WHO criteria for diagnosis of osteoporosis in postmenopausal women and in men 31 y/o or older: - normal: T-score -1.0 to + 1.0 - osteopenia/low bone density: T-score between -2.5 and -1.0 - osteoporosis: T-score below -2.5 - severe osteoporosis: T-score below -2.5 with history of fragility fracture Note: although not part of the WHO classification, the presence of a fragility fracture, regardless of the T-score, should be considered diagnostic of osteoporosis, provided other causes for the fracture have been excluded. Treatment: The National Osteoporosis Foundation recommends that treatment be considered in postmenopausal women and men age 80 or older with: 1. Hip or vertebral (clinical or morphometric) fracture 2. T-score of - 2.5 or lower at the spine or hip 3. 10-year fracture  probability by FRAX of at least 20% for a major osteoporotic fracture and 3% for a hip fracture Lela Fendt, MD [facility]   Assessment & Plan:   Problem List Items Addressed This Visit     Chronic fatigue   Chronic symptoms aggravated by recent COVID-19 episode. Some of her symptoms (night sweats, weight loss, stiffness, arthralgias, myalgias) are consistent with PMR, lymphoma relapse etc. Obtain blood work including sed rate, RF, c-Met and others.  Obtain cortisol test  3 days of 20 mg Prednisone  that Dr. Rollene prescribed did not improve her symptoms drastically, however, I would like to try another course of steroids with prednisone  15 mg daily for 2-4 weeks for presumed PMR      Relevant Medications   predniSONE  (DELTASONE ) 5 MG tablet   Other Relevant Orders   Comprehensive metabolic panel with GFR   Sedimentation rate   Rheumatoid factor   Urinalysis   Vitamin B12   VITAMIN D  25 Hydroxy (Vit-D Deficiency, Fractures)   Cortisol   CK   Lactate dehydrogenase   Malignant lymphoma (HCC)   Mantle cell lymphoma treated by Dr. Timmy in the past.  The patient has fatigue, night sweats, depression, weight loss etc. no visible adenopathy Obtain LDH Follow-up with Dr. Timmy        Relevant Medications   predniSONE  (DELTASONE ) 5 MG tablet   Weight loss - Primary   Chronic symptoms aggravated by recent COVID-19 episode.  Weight loss of 20 pounds Some of her symptoms (night sweats, weight loss, stiffness, arthralgias, myalgias) are consistent with PMR, lymphoma relapse etc. Obtain blood work including sed rate, RF, c-Met and others.  Obtain cortisol test  3 days of 20 mg Prednisone  that Dr. Rollene prescribed did not improve her symptoms drastically, however, would like to try another course of steroids with prednisone  15 mg daily for 2-4 weeks for presumed PMR      Relevant Medications   predniSONE  (DELTASONE ) 5 MG tablet   Other Relevant Orders    Comprehensive  metabolic panel with GFR   Sedimentation rate   Rheumatoid factor   Urinalysis   Vitamin B12   VITAMIN D  25 Hydroxy (Vit-D Deficiency, Fractures)   Cortisol   CK   Lactate dehydrogenase      Meds ordered this encounter  Medications   predniSONE  (DELTASONE ) 5 MG tablet    Sig: Take 3 tablets (15 mg total) by mouth daily with breakfast.    Dispense:  90 tablet    Refill:  1      Follow-up: Return in about 2 weeks (around 07/11/2024) for f/u with PCP Dr Rollene.  Marolyn Noel, MD

## 2024-06-27 NOTE — Assessment & Plan Note (Signed)
 Worse

## 2024-06-27 NOTE — Assessment & Plan Note (Signed)
 Chronic symptoms aggravated by recent COVID-19 episode. Some of her symptoms (night sweats, weight loss, stiffness, arthralgias, myalgias) are consistent with PMR, lymphoma relapse etc. Obtain blood work including sed rate, RF, c-Met and others.  Obtain cortisol test  3 days of 20 mg Prednisone  that Dr. Rollene prescribed did not improve her symptoms drastically, however, I would like to try another course of steroids with prednisone  15 mg daily for 2-4 weeks for presumed PMR

## 2024-06-27 NOTE — Patient Instructions (Signed)
Stop Crestor.

## 2024-06-27 NOTE — Assessment & Plan Note (Signed)
 Chronic symptoms aggravated by recent COVID-19 episode.  Weight loss of 20 pounds Some of her symptoms (night sweats, weight loss, stiffness, arthralgias, myalgias) are consistent with PMR, lymphoma relapse etc. Obtain blood work including sed rate, RF, c-Met and others.  Obtain cortisol test  3 days of 20 mg Prednisone  that Dr. Rollene prescribed did not improve her symptoms drastically, however, would like to try another course of steroids with prednisone  15 mg daily for 2-4 weeks for presumed PMR

## 2024-06-28 ENCOUNTER — Ambulatory Visit: Payer: Self-pay | Admitting: Internal Medicine

## 2024-06-28 LAB — RHEUMATOID FACTOR: Rheumatoid fact SerPl-aCnc: 12 [IU]/mL (ref <14)

## 2024-06-28 LAB — LACTATE DEHYDROGENASE: LDH: 212 U/L (ref 120–250)

## 2024-06-29 NOTE — Telephone Encounter (Signed)
 Hold vitamin D .  It was addressed.  Thanks

## 2024-07-10 DIAGNOSIS — H26491 Other secondary cataract, right eye: Secondary | ICD-10-CM | POA: Diagnosis not present

## 2024-07-11 ENCOUNTER — Ambulatory Visit: Admitting: Internal Medicine

## 2024-07-11 ENCOUNTER — Encounter: Payer: Self-pay | Admitting: Internal Medicine

## 2024-07-11 VITALS — BP 120/80 | HR 75 | Temp 98.7°F | Ht 64.0 in | Wt 93.4 lb

## 2024-07-11 DIAGNOSIS — E559 Vitamin D deficiency, unspecified: Secondary | ICD-10-CM | POA: Diagnosis not present

## 2024-07-11 DIAGNOSIS — F5104 Psychophysiologic insomnia: Secondary | ICD-10-CM | POA: Diagnosis not present

## 2024-07-11 DIAGNOSIS — E038 Other specified hypothyroidism: Secondary | ICD-10-CM

## 2024-07-11 DIAGNOSIS — F419 Anxiety disorder, unspecified: Secondary | ICD-10-CM

## 2024-07-11 DIAGNOSIS — R251 Tremor, unspecified: Secondary | ICD-10-CM | POA: Diagnosis not present

## 2024-07-11 DIAGNOSIS — Z681 Body mass index (BMI) 19 or less, adult: Secondary | ICD-10-CM

## 2024-07-11 DIAGNOSIS — M810 Age-related osteoporosis without current pathological fracture: Secondary | ICD-10-CM

## 2024-07-11 DIAGNOSIS — F329 Major depressive disorder, single episode, unspecified: Secondary | ICD-10-CM

## 2024-07-11 DIAGNOSIS — R636 Underweight: Secondary | ICD-10-CM

## 2024-07-11 DIAGNOSIS — M158 Other polyosteoarthritis: Secondary | ICD-10-CM

## 2024-07-11 MED ORDER — MIRTAZAPINE 7.5 MG PO TABS: 7.5000 mg | ORAL_TABLET | Freq: Every day | ORAL | 1 refills | Status: AC

## 2024-07-11 MED ORDER — LEVOTHYROXINE SODIUM 25 MCG PO TABS: 25.0000 ug | ORAL_TABLET | Freq: Every day | ORAL | 3 refills | Status: AC

## 2024-07-11 NOTE — Patient Instructions (Addendum)
 We will change the levothyroxine  to 25 mcg daily so take 1/2 pill of what you have until it is gone.  Think about the reclast  for the bones.  We have sent in mirtazapine to take at night time to help with anxiety.   For the prednisone  we will have you keep taking 15 mg daily until 11/14. Then start taking 10 mg daily (2 of the 5 mg pills) for 2 weeks. Starting 11/28 start taking 5 mg daily (1 of the 5 mg pills) for 2 weeks. Around 12/14 stop taking prednisone .

## 2024-07-11 NOTE — Progress Notes (Unsigned)
 Subjective:   Patient ID: Shannon Parsons, female    DOB: 1941-05-13, 83 y.o.   MRN: [id number]  Discussed the use of AI scribe software for clinical note transcription with the patient, who gave verbal consent to proceed.  History of Present Illness Shannon Parsons is an 83 year old female with osteoarthritis and osteoporosis who presents with persistent chest and back pain.  She experiences persistent chest pain that radiates to her back, described as 'tremendous pain' occurring early in the morning around 3 or 4 AM. The pain worsens with physical activities such as lifting or bending, leading her to avoid heavy lifting to prevent strain. Occasionally, the pain radiates around the side to the front.  She has a history of osteoarthritis and osteoporosis. A scan in August showed no fractures in her back but revealed arthritis in her spine. She experiences muscle pain in her shoulders and chest, which she attributes to possible muscle strain from previous illnesses like COVID-19 and flu, which involved significant coughing. No current coughing is reported.  She is currently taking Synthroid  (levothyroxine ) 50 mcg daily and reports experiencing nervousness and anxiety, sometimes cutting back to 25 mcg on her own. She takes Valium  2.5 mg as needed for anxiety, usually half a tablet when feeling anxious. She is on prednisone  15 mg daily and still experiences severe pain episodes. She takes two tablets in the morning and one with lunch to avoid stomach burning.  She has a history of lymphoma, with the last PET scan in 2019 showing no cancer activity. She is concerned about the possibility of bone cancer due to pain in her left leg and cramps, but previous scans have not indicated any such issues.  Review of Systems  Constitutional:  Positive for fatigue and unexpected weight change.  HENT: Negative.    Eyes: Negative.   Respiratory:  Positive for shortness of breath. Negative for cough and chest  tightness.   Cardiovascular:  Negative for chest pain, palpitations and leg swelling.  Gastrointestinal:  Negative for abdominal distention, abdominal pain, constipation, diarrhea, nausea and vomiting.  Endocrine: Positive for heat intolerance.  Musculoskeletal:  Positive for arthralgias and back pain.  Skin: Negative.   Neurological: Negative.   Psychiatric/Behavioral:  Positive for decreased concentration and dysphoric mood. The patient is nervous/anxious.     Objective:  Physical Exam Constitutional:      Appearance: She is well-developed.  HENT:     Head: Normocephalic and atraumatic.  Cardiovascular:     Rate and Rhythm: Normal rate and regular rhythm.  Pulmonary:     Effort: Pulmonary effort is normal. No respiratory distress.     Breath sounds: Normal breath sounds. No wheezing or rales.  Abdominal:     General: Bowel sounds are normal. There is no distension.     Palpations: Abdomen is soft.     Tenderness: There is no abdominal tenderness.  Musculoskeletal:     Cervical back: Normal range of motion.  Skin:    General: Skin is warm and dry.  Neurological:     Mental Status: She is alert and oriented to person, place, and time.     Coordination: Coordination normal.     Vitals:   07/11/24 0937  BP: 120/80  Pulse: 75  Temp: 98.7 F (37.1 C)  TempSrc: Oral  SpO2: 96%  Weight: 93 lb 6.4 oz (42.4 kg)  Height: 5' 4 (1.626 m)    Assessment and Plan Assessment & Plan Osteoporosis of lumbar spine and  bilateral hips   Osteoporosis is confirmed with significant bone density loss. Reclast  is preferred over Prolia due to minimal side effects. Consider Reclast  infusion for treatment and re-evaluate bone density after two years if initiated.  Osteoarthritis of spine   Osteoarthritis contributes to back pain, worsened by activity and muscle strain.  Aortic valve regurgitation   Aortic valve regurgitation is present without significant symptoms. Recent imaging shows  plaque buildup.  Atherosclerosis   Atherosclerosis is noted with plaque buildup. Discussed ABI for peripheral vascular disease screening.  Hypothyroidism, on levothyroxine    Hypothyroidism is managed with levothyroxine . TSH level is normal. Reduce levothyroxine  to 25 mcg daily and recheck thyroid  levels in a couple of months.  Anxiety disorder, on intermittent diazepam    Anxiety is managed with diazepam . Mirtazapine is discussed as a safer long-term option. Consider starting mirtazapine 7.5 mg daily for anxiety.  Musculoskeletal chest and back pain   Musculoskeletal pain is due to strain or osteoarthritis. Prednisone  trial showed improvement. Continue prednisone  15 mg daily for one month, then taper dose over subsequent weeks.  General Health Maintenance   Vitamin D  and B12 levels were previously Parsons. Advised to stop supplementation and recheck levels in 3-6 months.

## 2024-07-12 NOTE — Assessment & Plan Note (Signed)
 Start mirtazapine to help with insomnia.

## 2024-07-12 NOTE — Assessment & Plan Note (Signed)
 She is having symptoms. Will reduce dose to 25 mcg daily and reassess in 6-8 weeks. New rx done.

## 2024-07-12 NOTE — Assessment & Plan Note (Signed)
 Anxiety is managed with diazepam . Mirtazapine is discussed as a safer long-term option. Start mirtazapine 7.5 mg daily for anxiety.

## 2024-07-12 NOTE — Assessment & Plan Note (Signed)
 She is losing weight currently and she is asked to add nutrition with ensure or boost to help. She does have breathing problems and is not using breztri  consistently which may contribute to caloric deficit.

## 2024-07-12 NOTE — Assessment & Plan Note (Signed)
 Osteoporosis is confirmed with significant bone density loss. Reclast  is preferred over Prolia due to minimal side effects. Consider Reclast  infusion for treatment and re-evaluate bone density after two years if initiated.

## 2024-07-12 NOTE — Assessment & Plan Note (Signed)
Overall stable.   

## 2024-07-12 NOTE — Assessment & Plan Note (Signed)
 Most recent levels >100 she is asked to stop vitamin D  is all forms and recheck levels in 3 months.

## 2024-07-12 NOTE — Assessment & Plan Note (Signed)
 Osteoarthritis contributes to back pain, worsened by activity and muscle strain. Advised lidocaine  patches otc.

## 2024-07-13 ENCOUNTER — Telehealth: Payer: Self-pay | Admitting: Internal Medicine

## 2024-07-13 NOTE — Telephone Encounter (Signed)
 Copied from CRM #[id number]. Topic: Clinical - Prescription Issue >> Jul 13, 2024  3:17 PM Macario HERO wrote: Reason for CRM: Patient called said she does not feel comfortable taking this medication: mirtazapine (REMERON) 7.5 MG tablet [[id number]]. She does not want something she has to take everyday. She is interested in larazapam. - Requesting a call back.

## 2024-07-17 NOTE — Telephone Encounter (Signed)
 Lorazepam  is not that safe for long term. Since she is having symptoms everyday I would like to be able to control them so she does not have to feel bad every day. I would recommend she try remeron it is very safe to try.

## 2024-07-18 NOTE — Telephone Encounter (Signed)
 Called pt and tried to relay Dr. Marcel message. I was unable to read the full message bc as soon as I told her Dr. Rollene said medication is not safe for long term use pt became agitated and kept cutting me off saying I have been on this medication before, I didn't take it every day then. I only took it when I needed it. I even took it back when I had cancer. If she doesn't want to give it to me I'll handle this myself I asked pt for clarification if she is not willing to try remeron, pt states no I dont want that and I will get by myself and learn to handle it myself

## 2024-07-27 ENCOUNTER — Telehealth: Payer: Self-pay | Admitting: Cardiology

## 2024-07-27 NOTE — Telephone Encounter (Signed)
  Pt c/o BP issue: STAT if pt c/o blurred vision, one-sided weakness or slurred speech.  STAT if BP is GREATER than 180/120 TODAY.  STAT if BP is LESS than 90/60 and SYMPTOMATIC TODAY  1. What is your BP concern? Elevated   2. Have you taken any BP medication today? No   3. What are your last 5 BP readings? 185/67 - 8:00 am 179/87 - 10:20 am  184/85 - 10:30 am  135/80 - 5 mins ago   4. Are you having any other symptoms (ex. Dizziness, headache, blurred vision, passed out)? Bad headache

## 2024-07-27 NOTE — Telephone Encounter (Signed)
 Pt states she is currently not having any headaches. Last reported headache was this morning at 10:30. Patient said her BP are running much better now, last one was 135/80. Has not been seen in office since September, does not currently take BP meds. Suggested scheduling patient for a follow up visit, pt says she sees her PCP 11/26 and would like to ask them what they think and will call back if needing an appointment with us . Instructed patient to continue monitoring BP and making a log. Informed her to go to the ER or call 911 with any chest pain, dizziness, headache, blurred vision, one sided weakness, or syncope. Pt verbalizes understanding.

## 2024-07-31 ENCOUNTER — Encounter: Payer: Self-pay | Admitting: Internal Medicine

## 2024-07-31 ENCOUNTER — Ambulatory Visit: Payer: Self-pay

## 2024-07-31 ENCOUNTER — Ambulatory Visit (INDEPENDENT_AMBULATORY_CARE_PROVIDER_SITE_OTHER): Admitting: Internal Medicine

## 2024-07-31 VITALS — BP 140/88 | HR 93 | Ht 64.0 in | Wt 93.0 lb

## 2024-07-31 DIAGNOSIS — G4452 New daily persistent headache (NDPH): Secondary | ICD-10-CM | POA: Diagnosis not present

## 2024-07-31 DIAGNOSIS — M542 Cervicalgia: Secondary | ICD-10-CM | POA: Diagnosis not present

## 2024-07-31 DIAGNOSIS — D696 Thrombocytopenia, unspecified: Secondary | ICD-10-CM

## 2024-07-31 DIAGNOSIS — R292 Abnormal reflex: Secondary | ICD-10-CM | POA: Diagnosis not present

## 2024-07-31 DIAGNOSIS — E876 Hypokalemia: Secondary | ICD-10-CM | POA: Diagnosis not present

## 2024-07-31 DIAGNOSIS — I10 Essential (primary) hypertension: Secondary | ICD-10-CM | POA: Diagnosis not present

## 2024-07-31 DIAGNOSIS — N184 Chronic kidney disease, stage 4 (severe): Secondary | ICD-10-CM | POA: Diagnosis not present

## 2024-07-31 LAB — CBC WITH DIFFERENTIAL/PLATELET
Basophils Absolute: 0.1 K/uL (ref 0.0–0.1)
Basophils Relative: 1 % (ref 0.0–3.0)
Eosinophils Absolute: 0.2 K/uL (ref 0.0–0.7)
Eosinophils Relative: 3.3 % (ref 0.0–5.0)
HCT: 45.6 % (ref 36.0–46.0)
Hemoglobin: 14.9 g/dL (ref 12.0–15.0)
Lymphocytes Relative: 9.1 % — ABNORMAL LOW (ref 12.0–46.0)
Lymphs Abs: 0.7 K/uL (ref 0.7–4.0)
MCHC: 32.6 g/dL (ref 30.0–36.0)
MCV: 86.2 fl (ref 78.0–100.0)
Monocytes Absolute: 1.1 K/uL — ABNORMAL HIGH (ref 0.1–1.0)
Monocytes Relative: 14.1 % — ABNORMAL HIGH (ref 3.0–12.0)
Neutro Abs: 5.5 K/uL (ref 1.4–7.7)
Neutrophils Relative %: 72.5 % (ref 43.0–77.0)
Platelets: 202 K/uL (ref 150.0–400.0)
RBC: 5.29 Mil/uL — ABNORMAL HIGH (ref 3.87–5.11)
RDW: 14.8 % (ref 11.5–15.5)
WBC: 7.5 K/uL (ref 4.0–10.5)

## 2024-07-31 LAB — MAGNESIUM: Magnesium: 2 mg/dL (ref 1.5–2.5)

## 2024-07-31 LAB — FOLATE: Folate: >23.7 ng/mL (ref >5.9)

## 2024-07-31 LAB — BASIC METABOLIC PANEL WITH GFR
BUN: 24 mg/dL — ABNORMAL HIGH (ref 6–23)
CO2: 32 meq/L (ref 19–32)
Calcium: 10.1 mg/dL (ref 8.4–10.5)
Chloride: 96 meq/L (ref 96–112)
Creatinine, Ser: 1.3 mg/dL — ABNORMAL HIGH (ref 0.40–1.20)
GFR: 38.72 mL/min — ABNORMAL LOW (ref >60.00)
Glucose, Bld: 101 mg/dL — ABNORMAL HIGH (ref 70–99)
Potassium: 3.8 meq/L (ref 3.5–5.1)
Sodium: 138 meq/L (ref 135–145)

## 2024-07-31 LAB — C-REACTIVE PROTEIN: CRP: 5.7 mg/dL (ref 0.5–20.0)

## 2024-07-31 LAB — SEDIMENTATION RATE: Sed Rate: 15 mm/h (ref 0–30)

## 2024-07-31 NOTE — Progress Notes (Unsigned)
 Subjective:  Patient ID: Shannon Parsons, female    DOB: [birth year]  Age: 83 y.o. MRN: [id number]  CC: Headache (Ongoing for about 2 weeks, has been having some elevated BP readings at home)   HPI Shannon Parsons presents for f/up ---  Discussed the use of AI scribe software for clinical note transcription with the patient, who gave verbal consent to proceed.  History of Present Illness Shannon Parsons is an 83 year old female with a history of lymphoma who presents with severe headaches and dizziness.  She has been experiencing severe headaches for the past couple of weeks, primarily located in both temples and the frontal region. The pain is described as 'terrible' and sometimes radiates from the temples to the back of her head. The headaches began suddenly and are the worst she has ever experienced. No nausea, vomiting, or visual disturbances are present, although there is a sensation of pressure in her eyes and nose. She has not experienced any cold or sinus symptoms. A warm washcloth provided some relief for the nasal pressure.  She also reports feeling dizzy and off balance for a significant period, although she has not experienced any falls. She mentions bumping her shoulder on a doorframe, resulting in a bruise. She feels careful when getting up and moving around to avoid losing balance. No weakness, numbness, or tingling is reported.  She has a history of lymphoma diagnosed in 2016, for which she underwent chemotherapy, and mentions significant weight loss at the time of diagnosis. She also had COVID-19 approximately eight weeks ago and is concerned about the possibility of long COVID contributing to her symptoms.  Occasional soreness on the left side of her neck is attributed to her sleeping position, with no radiating neck pain. She experiences cramps in her big toes sporadically, particularly when lying down.  She has a history of cataracts and has undergone lens replacement  surgery, with a recent procedure on her right eye. No history of extra heartbeats or arrhythmias is reported.     Outpatient Medications Prior to Visit  Medication Sig Dispense Refill   acetaminophen  (TYLENOL ) 325 MG tablet Take 650 mg by mouth at bedtime as needed for mild pain.     feeding supplement (ENSURE PLUS Parsons PROTEIN) LIQD Take 237 mLs by mouth 3 (three) times daily between meals. 21330 mL 2   hyoscyamine  (LEVSIN  SL) 0.125 MG SL tablet Take    1 to 2 tablets     every 4 hours       as needed for Nausea, Cramping, Bloating or Diarrhea DIARRHEA 90 tablet 3   levothyroxine  (SYNTHROID ) 25 MCG tablet Take 1 tablet (25 mcg total) by mouth daily. 90 tablet 3   Magnesium 250 MG TABS Take 1 tablet by mouth daily as needed (muscle cramps).     mirtazapine (REMERON) 7.5 MG tablet Take 1 tablet (7.5 mg total) by mouth at bedtime. 90 tablet 1   predniSONE  (DELTASONE ) 5 MG tablet Take 3 tablets (15 mg total) by mouth daily with breakfast. 90 tablet 1   Spacer/Aero-Holding Chambers (EASIVENT MASK SMALL) MISC See admin instructions.     albuterol  (VENTOLIN  HFA) 108 (90 Base) MCG/ACT inhaler Inhale 1-2 puffs into the lungs every 6 (six) hours as needed for wheezing or shortness of breath. (Patient not taking: Reported on 07/31/2024) 18 g 0   b complex vitamins capsule Take 1 capsule by mouth daily. (Patient not taking: Reported on 07/31/2024)     Budeson-Glycopyrrol-Formoterol  (BREZTRI  AEROSPHERE)  160-9-4.8 MCG/ACT AERO Inhale 2 puffs into the lungs 2 (two) times daily. (Patient not taking: Reported on 07/31/2024) 10.7 g 11   Cholecalciferol  (VITAMIN D3) 50 MCG (2000 UT) capsule Take 4,000 Units by mouth daily.  (Patient not taking: Reported on 07/31/2024)     rosuvastatin  (CRESTOR ) 5 MG tablet TAKE 1/4 TABLET BY MOUTH DAILY FOR CHOLESTEROL (Patient not taking: Reported on 07/31/2024) 23 tablet 1   No facility-administered medications prior to visit.    ROS Review of Systems  Constitutional:   Positive for fatigue. Negative for appetite change, chills, diaphoresis and unexpected weight change.  HENT: Negative.  Negative for sore throat and trouble swallowing.   Eyes:  Negative for discharge and visual disturbance.  Respiratory: Negative.  Negative for cough, chest tightness, shortness of breath and wheezing.   Cardiovascular:  Negative for chest pain, palpitations and leg swelling.  Gastrointestinal: Negative.  Negative for abdominal pain, constipation, diarrhea, nausea and vomiting.  Genitourinary: Negative.  Negative for difficulty urinating and dysuria.  Musculoskeletal:  Positive for gait problem and neck pain. Negative for back pain and myalgias.  Skin: Negative.   Neurological:  Positive for dizziness, tremors and headaches. Negative for seizures, syncope, speech difficulty, weakness, light-headedness and numbness.  Hematological:  Negative for adenopathy. Does not bruise/bleed easily.  Psychiatric/Behavioral:  Negative for confusion and decreased concentration. The patient is nervous/anxious.     Objective:  BP (!) 140/88 (BP Location: Left Arm, Patient Position: Sitting)   Pulse 93   Ht 5' 4 (1.626 m)   Wt 93 lb (42.2 kg)   LMP 09/14/2014   SpO2 94%   BMI 15.96 kg/m   BP Readings from Last 3 Encounters:  07/31/24 (!) 140/88  07/11/24 120/80  06/27/24 98/64    Wt Readings from Last 3 Encounters:  07/31/24 93 lb (42.2 kg)  07/11/24 93 lb 6.4 oz (42.4 kg)  06/27/24 93 lb 6 oz (42.4 kg)    Physical Exam Vitals reviewed.  Constitutional:      General: She is not in acute distress.    Appearance: She is well-developed. She is not toxic-appearing or diaphoretic.  HENT:     Head: Normocephalic.     Mouth/Throat:     Mouth: Mucous membranes are moist.  Eyes:     General: No scleral icterus.    Extraocular Movements: Extraocular movements intact.     Right eye: Normal extraocular motion and no nystagmus.     Left eye: Normal extraocular motion and no  nystagmus.     Pupils: Pupils are equal, round, and reactive to light.  Neck:     Meningeal: Brudzinski's sign absent.  Cardiovascular:     Rate and Rhythm: Normal rate and regular rhythm. Frequent Extrasystoles are present.    Heart sounds: No murmur heard.    No friction rub. No gallop.  Pulmonary:     Effort: Pulmonary effort is normal.     Breath sounds: No stridor. No wheezing, rhonchi or rales.  Abdominal:     General: Abdomen is flat.     Palpations: There is no mass.     Tenderness: There is no abdominal tenderness. There is no guarding.     Hernia: No hernia is present.  Musculoskeletal:        General: Normal range of motion.     Cervical back: Normal range of motion and neck supple. No rigidity.     Right lower leg: No edema.     Left lower leg: No edema.  Lymphadenopathy:     Cervical: No cervical adenopathy.  Skin:    General: Skin is warm and dry.     Findings: No bruising or rash.  Neurological:     General: No focal deficit present.     Mental Status: She is alert.     Cranial Nerves: Cranial nerves 2-12 are intact. No cranial nerve deficit.     Sensory: Sensation is intact. No sensory deficit.     Motor: Tremor present. No weakness.     Coordination: Coordination abnormal.     Gait: Gait is intact. Gait normal.     Deep Tendon Reflexes: Reflexes abnormal. Babinski sign absent on the right side. Babinski sign present on the left side.     Reflex Scores:      Tricep reflexes are 1+ on the right side and 1+ on the left side.      Bicep reflexes are 1+ on the right side and 1+ on the left side.      Brachioradialis reflexes are 1+ on the right side and 1+ on the left side.      Patellar reflexes are 2+ on the right side and 3+ on the left side.      Achilles reflexes are 1+ on the right side and 1+ on the left side. Psychiatric:        Mood and Affect: Mood normal.        Behavior: Behavior normal.        Thought Content: Thought content normal.         Judgment: Judgment normal.     Lab Results  Component Value Date   WBC 7.5 07/31/2024   HGB 14.9 07/31/2024   HCT 45.6 07/31/2024   PLT 202.0 07/31/2024   GLUCOSE 101 (H) 07/31/2024   CHOL 127 04/23/2024   TRIG 34 04/23/2024   HDL 69 04/23/2024   LDLCALC 51 04/23/2024   ALT 24 06/27/2024   AST 32 06/27/2024   NA 138 07/31/2024   K 3.8 07/31/2024   CL 96 07/31/2024   CREATININE 1.30 (H) 07/31/2024   BUN 24 (H) 07/31/2024   CO2 32 07/31/2024   TSH 1.96 03/05/2024   INR 1.09 10/21/2014   HGBA1C 5.7 (H) 04/22/2024   MICROALBUR 5.2 11/23/2022    DG Bone Density Result Date: 06/18/2024 Table formatting from the original result was not included. Date of study: 06/18/2024 Exam: DUAL X-RAY ABSORPTIOMETRY (DXA) FOR BONE MINERAL DENSITY (BMD) Instrument: [employer] Requesting Provider: PCP Indication: Follow-up for osteoporosis Comparison: none (please note that it is not possible to compare data from different instruments) Clinical data: Pt is a 83 y.o. female without history of fragility (osteoporotic) fracture.  On vitamin D . Results:  Lumbar spine L1-L4 Femoral neck (FN) 33% distal radius T-score -2.5 RFN: -2.8 LFN: -3.0 n/a Assessment: Patient has OSTEOPOROSIS according to the [facility] classification for osteoporosis (see below). Fracture risk: Parsons Comments: the technical quality of the study is good Evaluation for secondary causes should be considered if clinically indicated. Recommend optimizing calcium  (1200 mg/day) and vitamin D  (800 IU/day). Treatment is indicated. Followup: Repeat BMD is appropriate after 2 years. WHO criteria for diagnosis of osteoporosis in postmenopausal women and in men 48 y/o or older: - normal: T-score -1.0 to + 1.0 - osteopenia/low bone density: T-score between -2.5 and -1.0 - osteoporosis: T-score below -2.5 - severe osteoporosis: T-score below -2.5 with history of fragility fracture Note: although not part of the WHO classification, the presence of a  fragility fracture, regardless of the T-score, should be considered diagnostic of osteoporosis, provided other causes for the fracture have been excluded. Treatment: The National Osteoporosis Foundation recommends that treatment be considered in postmenopausal women and men age 59 or older with: 1. Hip or vertebral (clinical or morphometric) fracture 2. T-score of - 2.5 or lower at the spine or hip 3. 10-year fracture probability by FRAX of at least 20% for a major osteoporotic fracture and 3% for a hip fracture Lela Fendt, MD [facility]   Assessment & Plan:   New daily persistent headache- CBC and sed rate are normal. Will evaluate for mass, ICH, NPH. -     C-reactive protein; Future -     Sedimentation rate; Future -     MR BRAIN WO CONTRAST; Future  Babinski sign positive in left foot -     Vitamin B1; Future -     Folate; Future -     C-reactive protein; Future -     Sedimentation rate; Future -     MR BRAIN WO CONTRAST; Future -     MR CERVICAL SPINE WO CONTRAST; Future  Thrombocytopenia- PLT's are normal now. -     CBC with Differential/Platelet; Future -     Vitamin B1; Future -     Folate; Future  Essential hypertension- BP is adequately well controlled. -     Basic metabolic panel with GFR; Future -     Magnesium; Future  Chronic hypokalemia -     Magnesium; Future  Neck pain on left side -     MR CERVICAL SPINE WO CONTRAST; Future  Abnormal reflexes of lower extremity -     MR BRAIN WO CONTRAST; Future -     MR CERVICAL SPINE WO CONTRAST; Future  Chronic kidney disease (CKD), stage 4 (HCC)- Will avoid nephrotoxic agents   Cervicalgia -     MR CERVICAL SPINE WO CONTRAST; Future     Follow-up: Return in about 4 weeks (around 08/28/2024).  Debby Molt, MD

## 2024-07-31 NOTE — Patient Instructions (Signed)
 General Headache Without Cause A headache is pain or discomfort felt around the head or neck area. There are many causes and types of headaches. A few common types include: Tension headaches. Migraine headaches. Cluster headaches. Chronic daily headaches. Sometimes, the specific cause of a headache may not be found. Follow these instructions at home: Watch your condition for any changes. Let your health care provider know about them. Take these steps to help with your condition: Managing pain     Take over-the-counter and prescription medicines only as told by your health care provider. Treatment may include medicines for pain that are taken by mouth or applied to the skin. Lie down in a dark, quiet room when you have a headache. Keep lights dim if bright lights bother you or make your headaches worse. If directed, put ice on your head and neck area: Put ice in a plastic bag. Place a towel between your skin and the bag. Leave the ice on for 20 minutes, 2-3 times per day. Remove the ice if your skin turns bright red. This is very important. If you cannot feel pain, heat, or cold, you have a greater risk of damage to the area. If directed, apply heat to the affected area. Use the heat source that your health care provider recommends, such as a moist heat pack or a heating pad. Place a towel between your skin and the heat source. Leave the heat on for 20-30 minutes. Remove the heat if your skin turns bright red. This is especially important if you are unable to feel pain, heat, or cold. You have a greater risk of getting burned. Eating and drinking Eat meals on a regular schedule. If you drink alcohol: Limit how much you have to: 0-1 drink a day for women who are not pregnant. 0-2 drinks a day for men. Know how much alcohol is in a drink. In the [place] one drink equals one 12 oz bottle of beer (355 mL), one 5 oz glass of wine (148 mL), or one 1 oz glass of hard liquor (44 mL). Stop  drinking caffeine, or decrease the amount of caffeine you drink. Drink enough fluid to keep your urine pale yellow. General instructions  Keep a headache journal to help find out what may trigger your headaches. For example, write down: What you eat and drink. How much sleep you get. Any change to your diet or medicines. Try massage or other relaxation techniques. Limit stress. Sit up straight, and do not tense your muscles. Do not use any products that contain nicotine or tobacco. These products include cigarettes, chewing tobacco, and vaping devices, such as e-cigarettes. If you need help quitting, ask your health care provider. Exercise regularly as told by your health care provider. Sleep on a regular schedule. Get 7-9 hours of sleep each night, or the amount recommended by your health care provider. Keep all follow-up visits. This is important. Contact a health care provider if: Medicine does not help your symptoms. You have a headache that is different from your usual headache. You have nausea or you vomit. You have a fever. Get help right away if: Your headache: Becomes severe quickly. Gets worse after moderate to intense physical activity. You have any of these symptoms: Repeated vomiting. Pain or stiffness in your neck. Changes to your vision. Pain in an eye or ear. Problems with speech. Muscular weakness or loss of muscle control. Loss of balance or coordination. You feel faint or pass out. You have confusion. You have  a seizure. These symptoms may represent a serious problem that is an emergency. Do not wait to see if the symptoms will go away. Get medical help right away. Call your local emergency services (911 in the [place] Do not drive yourself to the hospital. Summary A headache is pain or discomfort felt around the head or neck area. There are many causes and types of headaches. In some cases, the cause may not be found. Keep a headache journal to help find out  what may trigger your headaches. Watch your condition for any changes. Let your health care provider know about them. Contact a health care provider if you have a headache that is different from the usual headache, or if your symptoms are not helped by medicine. Get help right away if your headache becomes severe, you vomit, you have a loss of vision, you lose your balance, or you have a seizure. This information is not intended to replace advice given to you by your health care provider. Make sure you discuss any questions you have with your health care provider. Document Revised: 01/28/2021 Document Reviewed: 01/28/2021 Elsevier Patient Education  2024 [employer].

## 2024-07-31 NOTE — Telephone Encounter (Signed)
 FYI Only or Action Required?: FYI only for provider: appointment scheduled on 07/31/24.  Patient was last seen in primary care on 07/11/2024 by Shannon Almarie LABOR, MD.  Called Nurse Triage reporting Headache.  Symptoms began several weeks ago.  Interventions attempted: OTC medications: Tylenol , states Tylenol  relieves pain.  Symptoms are: gradually worsening.  Triage Disposition: See Physician Within 24 Hours  Patient/caregiver understands and will follow disposition?: Yes                            Copied from CRM [id number]. Topic: Clinical - Red Word Triage >> Jul 31, 2024  9:06 AM Shannon Parsons wrote: [facility] that prompted transfer to Nurse Triage: Terrible headaches, real bad nervous problems.  Reason for Disposition  [1] MODERATE headache (e.g., interferes with normal activities) AND [2] present > 24 hours AND [3] unexplained  (Exceptions: Pain medicines not tried, typical migraine, or headache part of viral illness.)  Answer Assessment - Initial Assessment Questions 1. LOCATION: Where does it hurt?      Forehead and bilateral temples 2. ONSET: When did the headache start? (e.g., minutes, hours, days)      A couple of weeks ago 3. PATTERN: Does the pain come and go, or has it been constant since it started?     Off and on, occur 3 x per week 4. SEVERITY: How bad is the pain? and What does it keep you from doing?  (e.g., Scale 1-10; mild, moderate, or severe)     Rates pain an 8 last night, states pain has decreased today 5. RECURRENT SYMPTOM: Have you ever had headaches before? If Yes, ask: When was the last time? and What happened that time?      Denies 6. CAUSE: What do you think is causing the headache?     Unsure, possibly related to BP 7. MIGRAINE: Have you been diagnosed with migraine headaches? If Yes, ask: Is this headache similar?      Denies 8. HEAD INJURY: Has there been any recent injury to your head?       Denies 9. OTHER SYMPTOMS: Do you have any other symptoms? (e.g., fever, stiff neck, eye pain, sore throat, cold symptoms)     BP 142/93 this morning, 160/87 prior to this call, nasal soreness, mild SOB upon exertion within last couple of weeks, generalized weakness, weight loss, describes feeling off-balance several weeks Denies chest pain, denies blurred vision, denies facial drooping, denies fever 10. PREGNANCY: Is there any chance you are pregnant? When was your last menstrual period?     N/A  Protocols used: Headache-A-AH

## 2024-08-04 LAB — VITAMIN B1: Vitamin B1 (Thiamine): 18 nmol/L (ref 8–30)

## 2024-08-08 ENCOUNTER — Ambulatory Visit: Admitting: Internal Medicine

## 2024-08-08 ENCOUNTER — Telehealth: Payer: Self-pay

## 2024-08-08 ENCOUNTER — Encounter: Payer: Self-pay | Admitting: Internal Medicine

## 2024-08-08 VITALS — BP 130/80 | HR 90 | Temp 98.2°F | Ht 64.0 in | Wt 90.8 lb

## 2024-08-08 DIAGNOSIS — E038 Other specified hypothyroidism: Secondary | ICD-10-CM

## 2024-08-08 DIAGNOSIS — G4452 New daily persistent headache (NDPH): Secondary | ICD-10-CM | POA: Diagnosis not present

## 2024-08-08 DIAGNOSIS — R634 Abnormal weight loss: Secondary | ICD-10-CM | POA: Diagnosis not present

## 2024-08-08 DIAGNOSIS — E43 Unspecified severe protein-calorie malnutrition: Secondary | ICD-10-CM

## 2024-08-08 DIAGNOSIS — R5382 Chronic fatigue, unspecified: Secondary | ICD-10-CM | POA: Diagnosis not present

## 2024-08-08 NOTE — Progress Notes (Signed)
 Subjective:   Patient ID: Shannon Parsons, female    DOB: 1941-01-19, 83 y.o.   MRN: [id number]  Discussed the use of AI scribe software for clinical note transcription with the patient, who gave verbal consent to proceed. History of Present Illness Shannon Parsons is an 83 year old female who presents with persistent headaches and weight loss.  She has been experiencing persistent headaches for the past few weeks, described as present but tolerable, with associated pressure in the eyes and nose, particularly upon waking. She uses Tylenol  and Valium  for relief, noting that Valium  helps due to its muscle relaxant properties. She takes Valium  sparingly, usually half a tablet, and waits four to five hours before considering another dose.  She has lost three pounds over the past month, with her current weight at 90 pounds, down from 93 pounds. She has not been taking mirtazapine , which was prescribed to stimulate her appetite. She did not fill or take this medicine. She consumes Boost nutritional drinks, approximately one to one and a half per day, but acknowledges eating less than she used to, particularly at lunch. She feels a lack of energy and constant shaking.  She experiences constipation, which is alleviated by taking a magnesium pill at night. No recent changes in breathing are noted, but she does wake up with headaches and pressure in her nose and eyes, which sometimes feel like they are going to explode. These symptoms occur intermittently, sometimes skipping a day.  She mentions a tick bite that occurred approximately two weeks ago, which she treated with peroxide, alcohol, a Band-Aid, and antibiotic ointment. She is unsure how the tick got on her as she has not been outside much.  She reports fluctuating blood pressure readings at home, with values as Parsons as 188/102 and as low as 164/91. Her blood pressure readings are consistently normal during office visits. She uses a blood pressure  monitor from the [place], which was given to her by her uncle.  Review of Systems  Constitutional:  Positive for activity change, appetite change and fatigue.  HENT: Negative.    Eyes: Negative.   Respiratory:  Positive for shortness of breath. Negative for cough and chest tightness.   Cardiovascular:  Negative for chest pain, palpitations and leg swelling.  Gastrointestinal:  Positive for constipation. Negative for abdominal distention, abdominal pain, diarrhea, nausea and vomiting.  Musculoskeletal:  Positive for arthralgias and myalgias.  Skin: Negative.   Neurological:  Positive for tremors and headaches.  Psychiatric/Behavioral: Negative.      Objective:  Physical Exam Constitutional:      Appearance: She is well-developed.     Comments: Temporal wasting and muscle wasting  HENT:     Head: Normocephalic and atraumatic.  Cardiovascular:     Rate and Rhythm: Normal rate and regular rhythm.  Pulmonary:     Effort: Pulmonary effort is normal. No respiratory distress.     Breath sounds: Normal breath sounds. No wheezing or rales.  Abdominal:     General: Bowel sounds are normal. There is no distension.     Palpations: Abdomen is soft.     Tenderness: There is no abdominal tenderness.  Musculoskeletal:        General: Tenderness present.     Cervical back: Normal range of motion.  Skin:    General: Skin is warm and dry.  Neurological:     Mental Status: She is alert and oriented to person, place, and time.     Coordination: Coordination  normal.     Comments: Stable tremor without change     Vitals:   08/08/24 0937  BP: 130/80  Pulse: 90  Temp: 98.2 F (36.8 C)  TempSrc: Oral  SpO2: 96%  Weight: 90 lb 12.8 oz (41.2 kg)  Height: 5' 4 (1.626 m)    Assessment and Plan Assessment & Plan Unintentional weight loss   Her weight has decreased from 93 lbs to 90 lbs. Mirtazapine  was discussed for its appetite-stimulating benefits and side effects. She did not remember that  we had discussed this and did not start this medicine. She should increase her caloric intake by 300-500 calories daily and continue using nutritional supplements like Boost or Ensure. Start mirtazapine  at bedtime and follow up closely in 1 month.  Fatigue Suspect related to severe protein calorie malnutrition and muscle wasting. She is down to 90 pounds today and down 3 pounds in the last month. Encouraged to do some movement/exercise and increase nutritional intake.   Headache   She experiences near daily headaches with nasal pressure and eye pain. An MRI of the head and neck has been ordered to evaluate underlying causes which is scheduled for in 2 weeks. I advised her to get this done. Continue using tylenol  and rarely Valium  as needed for headache relief.  Hypothyroidism   Her thyroid  medication dose has been adjusted, and there are no symptoms attributed to thyroid  function. Continue the current thyroid  medication regimen.  Constipation   She experiences difficulty with bowel movements, relieved by magnesium. Continue magnesium supplementation at night.

## 2024-08-08 NOTE — Assessment & Plan Note (Signed)
 She experiences near daily headaches with nasal pressure and eye pain. An MRI of the head and neck has been ordered to evaluate underlying causes which is scheduled for in 2 weeks. I advised her to get this done. Continue using tylenol  and rarely Valium  as needed for headache relief.

## 2024-08-08 NOTE — Assessment & Plan Note (Signed)
 Suspect related to severe protein calorie malnutrition and muscle wasting. She is down to 90 pounds today and down 3 pounds in the last month. Encouraged to do some movement/exercise and increase nutritional intake.

## 2024-08-08 NOTE — Patient Instructions (Addendum)
 We will have you start taking mirtazapine  from the pharmacy to help with the appetite. Take 1 pill daily at bedtime.

## 2024-08-08 NOTE — Assessment & Plan Note (Signed)
 She has severe deficit with loss of 3 pounds in the last month. She is tired and has lost significant amount of muscle recently. Mirtazapine  was discussed for its appetite-stimulating benefits and side effects. She did not remember that we had discussed this and did not start this medicine. She should increase her caloric intake by 300-500 calories daily and continue using nutritional supplements like Boost or Ensure. Start mirtazapine  at bedtime and follow up closely in 1 month.

## 2024-08-08 NOTE — Assessment & Plan Note (Signed)
 Mirtazapine  was discussed for its appetite-stimulating benefits and side effects. She did not remember that we had discussed this and did not start this medicine. She should increase her caloric intake by 300-500 calories daily and continue using nutritional supplements like Boost or Ensure. Start mirtazapine  at bedtime and follow up closely in 1 month.

## 2024-08-08 NOTE — Telephone Encounter (Signed)
 Copied from CRM [id number]. Topic: Clinical - Prescription Issue >> Aug 08, 2024  3:06 PM Ashley R wrote: Reason for CRM: Awaiting rx mirtazapine  from this morning appt.

## 2024-08-08 NOTE — Assessment & Plan Note (Signed)
 Her thyroid  medication dose has been adjusted, and there are no symptoms attributed to thyroid  function. Continue the current thyroid  medication regimen.

## 2024-08-13 NOTE — Telephone Encounter (Signed)
 Pt has gotten the medication.

## 2024-08-24 ENCOUNTER — Other Ambulatory Visit: Payer: Self-pay | Admitting: Internal Medicine

## 2024-08-24 DIAGNOSIS — R921 Mammographic calcification found on diagnostic imaging of breast: Secondary | ICD-10-CM

## 2024-08-25 ENCOUNTER — Inpatient Hospital Stay: Admission: RE | Admit: 2024-08-25 | Discharge: 2024-08-25 | Attending: Internal Medicine | Admitting: Internal Medicine

## 2024-08-25 DIAGNOSIS — R292 Abnormal reflex: Secondary | ICD-10-CM

## 2024-08-25 DIAGNOSIS — M542 Cervicalgia: Secondary | ICD-10-CM

## 2024-08-25 DIAGNOSIS — G4452 New daily persistent headache (NDPH): Secondary | ICD-10-CM

## 2024-08-28 ENCOUNTER — Ambulatory Visit: Payer: Self-pay | Admitting: Internal Medicine

## 2024-08-31 ENCOUNTER — Telehealth: Payer: Self-pay

## 2024-08-31 NOTE — Telephone Encounter (Signed)
 Copied from CRM #[id number]. Topic: Clinical - Lab/Test Results >> Aug 31, 2024  8:54 AM Mercedes MATSU wrote: Reason for CRM: Patient called in to go over imaging results in mychart that were released. Attempted to transfer call to [facility], but they informed me to just send a CRM. CRM was sent, patient is awaiting call back and can be reached at [phone].

## 2024-09-03 NOTE — Telephone Encounter (Signed)
 Pt was unable to see MRI results and notes from Dr. Joshua in her MyChart. Reviewed results with pt. Pt stated that she is feeling fine other than some left hip pain which she's attributing to arthritis. Pt also stated that she had a fall several years ago and may be the cause of her hip pain. Pt has an upcoming appt with Dr. Rollene on 12/30 which she plans to discuss results at this time. Pt had no further concerns

## 2024-09-11 ENCOUNTER — Ambulatory Visit: Admitting: Internal Medicine

## 2024-09-11 VITALS — BP 122/80 | HR 58 | Temp 98.2°F | Ht 64.0 in | Wt 96.2 lb

## 2024-09-11 DIAGNOSIS — M47812 Spondylosis without myelopathy or radiculopathy, cervical region: Secondary | ICD-10-CM | POA: Diagnosis not present

## 2024-09-11 DIAGNOSIS — I739 Peripheral vascular disease, unspecified: Secondary | ICD-10-CM | POA: Diagnosis not present

## 2024-09-11 DIAGNOSIS — R634 Abnormal weight loss: Secondary | ICD-10-CM

## 2024-09-11 DIAGNOSIS — G4452 New daily persistent headache (NDPH): Secondary | ICD-10-CM | POA: Diagnosis not present

## 2024-09-11 DIAGNOSIS — F411 Generalized anxiety disorder: Secondary | ICD-10-CM | POA: Diagnosis not present

## 2024-09-11 DIAGNOSIS — S93402A Sprain of unspecified ligament of left ankle, initial encounter: Secondary | ICD-10-CM

## 2024-09-11 NOTE — Progress Notes (Unsigned)
" ° °  Subjective:   Patient ID: Shannon Parsons, female    DOB: 02-02-1941, 83 y.o.   MRN: [id number]  Discussed the use of AI scribe software for clinical note transcription with the patient, who gave verbal consent to proceed.  History of Present Illness Shannon Parsons is an 83 year old female with chronic headaches and neck arthritis who presents with worsening headaches and neck pain.  She experiences intense headaches that occur sporadically, with significant episodes waking her at night with severe pain and pressure around the eyes. A recent severe headache occurred last Friday or Saturday night. The headaches are described as having a lot of pressure, particularly around the eyes, and can be quite intense when they occur.  An MRI of the head and neck was performed to evaluate the cause of her headaches and eye pressure. However, she was told that the MRI showed chronic small vessel disease and arthritis in the neck, particularly starting at C2. She experiences stiffness and soreness in the neck, which she attributes partly to her sleeping position.  She recently fell at home, slipping on hardwood floors while wearing hose, resulting in soreness and swelling in her ankle. The ankle was swollen and sore the day after the fall, but it has not been swollen since. No falls prior to this incident.  Her weight has increased slightly from 90 to 96 pounds. She reports that her appetite may have improved, and she is taking medication nightly to increase her appetite.  She has a history of receiving two pneumonia vaccines and one shingles vaccine. Her husband noted the hardwood floor might have contributed to her fall.  Review of Systems  Objective:  Physical Exam  Vitals:   09/11/24 1050  BP: 122/80  Pulse: (!) 58  Temp: 98.2 F (36.8 C)  TempSrc: Oral  SpO2: 95%  Weight: 96 lb 3.2 oz (43.6 kg)  Height: 5' 4 (1.626 m)    Assessment and Plan Assessment & Plan Headache   She  experiences intermittent severe headaches with pressure and eye pain. MRI results showed no brain cancer or sinus congestion, but chronic small vessel disease and cervical spondylosis may contribute. Continue current management as there are no new findings on MRI.  Cervical spondylosis with radiculopathy   MRI revealed arthritis at C2-C3 with potential right nerve impingement, but no significant spinal cord compression. Monitor for radiculopathy symptoms such as right arm pain or tingling.  Cerebral small vessel disease   MRI indicated chronic small vessel disease with plaque buildup, without acute changes or significant edema. Continue current management as no acute intervention is required.  Unintentional weight loss   Her weight increased from 90 to 96 pounds, likely due to improved appetite with medication. Continue current medication regimen to support appetite.  Ankle sprain   She has a mild ankle sprain with swelling and soreness, but no severe injury. Advised caution with the ankle to prevent further injury.  General Health Maintenance   She is up to date on pneumonia and shingles vaccines. Discussed the new shingles vaccine option to reduce risk. Consider the new shingles vaccine at the pharmacy if desired.   "

## 2024-09-12 ENCOUNTER — Encounter: Payer: Self-pay | Admitting: Internal Medicine

## 2024-09-12 DIAGNOSIS — I739 Peripheral vascular disease, unspecified: Secondary | ICD-10-CM | POA: Insufficient documentation

## 2024-09-12 NOTE — Assessment & Plan Note (Signed)
 MRI indicated chronic small vessel disease with plaque buildup,which was progressed from prior without acute changes or significant edema. Continue current management with statin and BP control to help prevent progression.

## 2024-09-12 NOTE — Assessment & Plan Note (Signed)
 She is now taking remeron  nightly to help with appetite and she is unsure if this has helped with her anxiety.

## 2024-10-15 ENCOUNTER — Encounter

## 2024-12-13 ENCOUNTER — Ambulatory Visit
